# Patient Record
Sex: Female | Born: 1935 | Race: Black or African American | Hispanic: No | Marital: Married | Smoking: Former smoker
Health system: Southern US, Community
[De-identification: ages and names within clinical notes are randomized; demographics above are authoritative.]

## PROBLEM LIST (undated history)

## (undated) DIAGNOSIS — Z0389 Encounter for observation for other suspected diseases and conditions ruled out: Secondary | ICD-10-CM

## (undated) DIAGNOSIS — R222 Localized swelling, mass and lump, trunk: Secondary | ICD-10-CM

## (undated) DIAGNOSIS — E119 Type 2 diabetes mellitus without complications: Secondary | ICD-10-CM

## (undated) DIAGNOSIS — I5032 Chronic diastolic (congestive) heart failure: Secondary | ICD-10-CM

## (undated) DIAGNOSIS — I4892 Unspecified atrial flutter: Secondary | ICD-10-CM

## (undated) DIAGNOSIS — I480 Paroxysmal atrial fibrillation: Secondary | ICD-10-CM

## (undated) DIAGNOSIS — I272 Pulmonary hypertension, unspecified: Secondary | ICD-10-CM

## (undated) DIAGNOSIS — E7800 Pure hypercholesterolemia, unspecified: Secondary | ICD-10-CM

## (undated) DIAGNOSIS — I10 Essential (primary) hypertension: Secondary | ICD-10-CM

## (undated) DIAGNOSIS — Z96659 Presence of unspecified artificial knee joint: Secondary | ICD-10-CM

## (undated) DIAGNOSIS — I2510 Atherosclerotic heart disease of native coronary artery without angina pectoris: Secondary | ICD-10-CM

## (undated) DIAGNOSIS — M1990 Unspecified osteoarthritis, unspecified site: Secondary | ICD-10-CM

## (undated) DIAGNOSIS — Z950 Presence of cardiac pacemaker: Secondary | ICD-10-CM

## (undated) DIAGNOSIS — I495 Sick sinus syndrome: Secondary | ICD-10-CM

## (undated) DIAGNOSIS — K760 Fatty (change of) liver, not elsewhere classified: Secondary | ICD-10-CM

## (undated) HISTORY — DX: Fatty (change of) liver, not elsewhere classified: K76.0

## (undated) HISTORY — DX: Pure hypercholesterolemia, unspecified: E78.00

## (undated) HISTORY — DX: Chronic diastolic (congestive) heart failure: I50.32

## (undated) HISTORY — PX: BREAST SURGERY: SHX581

## (undated) HISTORY — DX: Localized swelling, mass and lump, trunk: R22.2

## (undated) HISTORY — PX: TONSILLECTOMY AND ADENOIDECTOMY: SUR1326

## (undated) HISTORY — PX: CATARACT EXTRACTION: SUR2

## (undated) HISTORY — DX: Atherosclerotic heart disease of native coronary artery without angina pectoris: I25.10

## (undated) HISTORY — DX: Sick sinus syndrome: I49.5

## (undated) HISTORY — DX: Essential (primary) hypertension: I10

## (undated) HISTORY — DX: Type 2 diabetes mellitus without complications: E11.9

## (undated) HISTORY — DX: Paroxysmal atrial fibrillation: I48.0

## (undated) HISTORY — DX: Presence of cardiac pacemaker: Z95.0

## (undated) HISTORY — DX: Pulmonary hypertension, unspecified: I27.20

## (undated) HISTORY — DX: Unspecified osteoarthritis, unspecified site: M19.90

## (undated) HISTORY — PX: TOTAL KNEE ARTHROPLASTY: SHX125

## (undated) HISTORY — DX: Presence of unspecified artificial knee joint: Z96.659

## (undated) HISTORY — DX: Unspecified atrial flutter: I48.92

---

## 1998-08-04 ENCOUNTER — Other Ambulatory Visit: Admission: RE | Admit: 1998-08-04 | Discharge: 1998-08-04 | Payer: Self-pay | Admitting: *Deleted

## 1998-08-27 ENCOUNTER — Ambulatory Visit (HOSPITAL_COMMUNITY): Admission: RE | Admit: 1998-08-27 | Discharge: 1998-08-27 | Payer: Self-pay | Admitting: *Deleted

## 1999-01-27 ENCOUNTER — Ambulatory Visit (HOSPITAL_COMMUNITY): Admission: RE | Admit: 1999-01-27 | Discharge: 1999-01-27 | Payer: Self-pay | Admitting: Internal Medicine

## 1999-01-27 ENCOUNTER — Encounter: Payer: Self-pay | Admitting: Internal Medicine

## 1999-08-14 ENCOUNTER — Other Ambulatory Visit: Admission: RE | Admit: 1999-08-14 | Discharge: 1999-08-14 | Payer: Self-pay | Admitting: *Deleted

## 1999-09-03 ENCOUNTER — Ambulatory Visit (HOSPITAL_COMMUNITY): Admission: RE | Admit: 1999-09-03 | Discharge: 1999-09-03 | Payer: Self-pay | Admitting: *Deleted

## 2000-10-04 ENCOUNTER — Encounter: Payer: Self-pay | Admitting: Internal Medicine

## 2000-10-04 ENCOUNTER — Ambulatory Visit (HOSPITAL_COMMUNITY): Admission: RE | Admit: 2000-10-04 | Discharge: 2000-10-04 | Payer: Self-pay | Admitting: Internal Medicine

## 2000-11-14 ENCOUNTER — Ambulatory Visit (HOSPITAL_COMMUNITY): Admission: RE | Admit: 2000-11-14 | Discharge: 2000-11-14 | Payer: Self-pay | Admitting: Gastroenterology

## 2001-09-22 ENCOUNTER — Other Ambulatory Visit: Admission: RE | Admit: 2001-09-22 | Discharge: 2001-09-22 | Payer: Self-pay | Admitting: Internal Medicine

## 2001-10-09 ENCOUNTER — Encounter: Payer: Self-pay | Admitting: Internal Medicine

## 2001-10-09 ENCOUNTER — Ambulatory Visit (HOSPITAL_COMMUNITY): Admission: RE | Admit: 2001-10-09 | Discharge: 2001-10-09 | Payer: Self-pay | Admitting: Internal Medicine

## 2002-10-17 ENCOUNTER — Ambulatory Visit (HOSPITAL_COMMUNITY): Admission: RE | Admit: 2002-10-17 | Discharge: 2002-10-17 | Payer: Self-pay | Admitting: Internal Medicine

## 2002-10-17 ENCOUNTER — Encounter: Payer: Self-pay | Admitting: Internal Medicine

## 2003-10-29 ENCOUNTER — Encounter: Admission: RE | Admit: 2003-10-29 | Discharge: 2003-10-29 | Payer: Self-pay | Admitting: Internal Medicine

## 2003-11-21 ENCOUNTER — Other Ambulatory Visit: Admission: RE | Admit: 2003-11-21 | Discharge: 2003-11-21 | Payer: Self-pay | Admitting: Internal Medicine

## 2004-11-03 ENCOUNTER — Encounter: Admission: RE | Admit: 2004-11-03 | Discharge: 2004-11-03 | Payer: Self-pay | Admitting: Internal Medicine

## 2005-08-03 ENCOUNTER — Encounter: Admission: RE | Admit: 2005-08-03 | Discharge: 2005-08-03 | Payer: Self-pay | Admitting: Orthopedic Surgery

## 2005-11-08 ENCOUNTER — Encounter: Admission: RE | Admit: 2005-11-08 | Discharge: 2005-11-08 | Payer: Self-pay | Admitting: Internal Medicine

## 2005-11-29 ENCOUNTER — Inpatient Hospital Stay (HOSPITAL_COMMUNITY): Admission: RE | Admit: 2005-11-29 | Discharge: 2005-12-02 | Payer: Self-pay | Admitting: Orthopedic Surgery

## 2005-12-05 ENCOUNTER — Emergency Department (HOSPITAL_COMMUNITY): Admission: EM | Admit: 2005-12-05 | Discharge: 2005-12-05 | Payer: Self-pay | Admitting: Emergency Medicine

## 2006-02-23 ENCOUNTER — Ambulatory Visit (HOSPITAL_BASED_OUTPATIENT_CLINIC_OR_DEPARTMENT_OTHER): Admission: RE | Admit: 2006-02-23 | Discharge: 2006-02-23 | Payer: Self-pay | Admitting: Orthopedic Surgery

## 2006-11-11 ENCOUNTER — Encounter: Admission: RE | Admit: 2006-11-11 | Discharge: 2006-11-11 | Payer: Self-pay | Admitting: Internal Medicine

## 2007-11-13 ENCOUNTER — Encounter: Admission: RE | Admit: 2007-11-13 | Discharge: 2007-11-13 | Payer: Self-pay | Admitting: Internal Medicine

## 2008-01-18 ENCOUNTER — Encounter: Admission: RE | Admit: 2008-01-18 | Discharge: 2008-01-18 | Payer: Self-pay | Admitting: Internal Medicine

## 2008-06-27 ENCOUNTER — Other Ambulatory Visit: Admission: RE | Admit: 2008-06-27 | Discharge: 2008-06-27 | Payer: Self-pay | Admitting: Internal Medicine

## 2008-07-08 ENCOUNTER — Encounter: Admission: RE | Admit: 2008-07-08 | Discharge: 2008-07-08 | Payer: Self-pay | Admitting: Internal Medicine

## 2009-02-03 ENCOUNTER — Encounter: Admission: RE | Admit: 2009-02-03 | Discharge: 2009-02-03 | Payer: Self-pay | Admitting: Internal Medicine

## 2009-10-24 ENCOUNTER — Ambulatory Visit (HOSPITAL_COMMUNITY): Admission: RE | Admit: 2009-10-24 | Discharge: 2009-10-24 | Payer: Self-pay | Admitting: Internal Medicine

## 2009-12-07 ENCOUNTER — Emergency Department (HOSPITAL_COMMUNITY): Admission: EM | Admit: 2009-12-07 | Discharge: 2009-12-07 | Payer: Self-pay | Admitting: Emergency Medicine

## 2010-02-10 ENCOUNTER — Encounter: Admission: RE | Admit: 2010-02-10 | Discharge: 2010-02-10 | Payer: Self-pay | Admitting: Internal Medicine

## 2010-09-03 ENCOUNTER — Encounter
Admission: RE | Admit: 2010-09-03 | Discharge: 2010-09-03 | Payer: Self-pay | Source: Home / Self Care | Attending: Internal Medicine | Admitting: Internal Medicine

## 2010-11-18 LAB — DIFFERENTIAL
Basophils Absolute: 0.1 10*3/uL (ref 0.0–0.1)
Basophils Relative: 1 % (ref 0–1)
Eosinophils Absolute: 0.1 10*3/uL (ref 0.0–0.7)
Eosinophils Relative: 1 % (ref 0–5)
Monocytes Absolute: 0.3 10*3/uL (ref 0.1–1.0)
Monocytes Relative: 5 % (ref 3–12)

## 2010-11-18 LAB — POCT CARDIAC MARKERS
CKMB, poc: 1.3 ng/mL (ref 1.0–8.0)
Myoglobin, poc: 102 ng/mL (ref 12–200)
Myoglobin, poc: 62.9 ng/mL (ref 12–200)
Troponin i, poc: <0.05 ng/mL (ref 0.00–0.09)

## 2010-11-18 LAB — D-DIMER, QUANTITATIVE: D-Dimer, Quant: 0.76 ug/mL-FEU — ABNORMAL HIGH (ref 0.00–0.48)

## 2010-11-18 LAB — POCT I-STAT, CHEM 8
BUN: 20 mg/dL (ref 6–23)
Calcium, Ion: 1.08 mmol/L — ABNORMAL LOW (ref 1.12–1.32)
Creatinine, Ser: 0.8 mg/dL (ref 0.4–1.2)
Glucose, Bld: 169 mg/dL — ABNORMAL HIGH (ref 70–99)
Potassium: 3.8 mEq/L (ref 3.5–5.1)
TCO2: 25 mmol/L (ref 0–100)

## 2010-11-18 LAB — URINALYSIS, ROUTINE W REFLEX MICROSCOPIC
Bilirubin Urine: NEGATIVE
Ketones, ur: NEGATIVE mg/dL
Nitrite: NEGATIVE
Protein, ur: 30 mg/dL — AB
pH: 6.5 (ref 5.0–8.0)

## 2010-11-18 LAB — CBC
MCV: 90.3 fL (ref 78.0–100.0)
RBC: 4.23 MIL/uL (ref 3.87–5.11)

## 2010-11-18 LAB — PROTIME-INR: Prothrombin Time: 13.1 seconds (ref 11.6–15.2)

## 2010-11-18 LAB — URINE MICROSCOPIC-ADD ON

## 2010-11-18 LAB — HEPATIC FUNCTION PANEL: Total Protein: 7 g/dL (ref 6.0–8.3)

## 2010-11-18 LAB — URINE CULTURE: Colony Count: 25000

## 2011-01-15 NOTE — Op Note (Signed)
NAME:  Tracey Todd, Tracey Todd               ACCOUNT NO.:  [id number]   MEDICAL RECORD NO.:  [id number]          PATIENT TYPE:  INP   LOCATION:  5001                         FACILITY:  [facility]   PHYSICIAN:  Mila Homer. Sherlean Foot, M.D. 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   OPERATION PERFORMED:  Left revision total knee arthroplasty.   SURGEON:  Mila Homer. Sherlean Foot, M.D.   ASSISTANTArlys John D. Petrarca, P.A.-C.   ANESTHESIA:  General.   INDICATIONS FOR PROCEDURE:  The patient is a 76 year old black female with  failure of a total knee arthroplasty at 12 years.  Informed consent was  obtained.  Medical clearance was obtained.   DESCRIPTION OF PROCEDURE:  The patient was laid supine and administered  general anesthesia.  The left lower extremity was prepped and draped in the  usual sterile fashion.  Extremity was exsanguinated with the Esmarch and  tourniquet elevated to 375 mmHg.  Old incision was used and was made with a  #10 blade.  I performed a medium parapatellar arthrotomy as well as  quadriceps snip for exposure.  I removed an excessive amount of scar tissue.  There was lots of heterotopic calcification in the suprapatellar pouch  medially. That was all removed as well.  I then used a small sagittal saw  and loosened up both components easily, finished that with some Rosen  osteotomes and easily removed both components.  I curetted and rongeured out  all debris from the undersurface of the prosthesis bone interface and then  turned attention to leg balancing.  I reamed both by hand and under power on  the tibia up out to 15 and on the femur up to 17.  I then sagittally sized  the femur to a size D and put a 5  distal, 5 posterior medial augment on the  cutting block and tamped it down with an offset femoral stem and cut the  box.  I then turned attention to the tibia where I put the 14 mm reamer  down, used the intramedullary cutting block and freshened up the tibial cut.  I then prepared the tibia with a drill and keel with a size 3 tibial tray.  I then trialed with a size 3 tibial tray with a 15 stem, a size D femur with  an offset stem maximally rotated towards the medial side to give better  coverage as well as a 5 mm distal augment and 5 mm posterior medial augment.  This afforded excellent flexion and extension gap balance with a 14 mm  polyethylene in place.  I then removed the old patella which was loose,  drilled three lug holes with a 32 template in place, had good patellar  tracking as well.  I then removed all the components, copiously irrigated  and then cemented in the tibia  first, femur second, removed excess cement,  snapped in the real polyethylene and left the knee in extension while I  cemented in the patella.  I then closed the arthrotomy with interrupted #2  Ethibond sutures.  Repaired the quadriceps snip with simple and interrupted  figure-of-eight sutures Ethibond sutures.  Closed the deep soft tissue with  interrupted 0 Vicryls, running subcuticular Vicryl and skin staples. Left  the Hemovac coming out deep to the arthrotomy.  I did infiltrate some  Marcaine into the capsule and dressed with Xeroform dressing sponges,  sterile Webril and TED stockings.   COMPLICATIONS:  None.   DRAINS:  One Hemovac.   ESTIMATED BLOOD LOSS:  300 mL.   TOURNIQUET TIME:  Two hours.           ______________________________  Mila Homer Sherlean Foot, M.D.     SDL/MEDQ  D:  11/29/2005  T:  11/30/2005  Job:  [id number]

## 2011-01-15 NOTE — Discharge Summary (Signed)
NAME:  Tracey Todd, Tracey Todd               ACCOUNT NO.:  [id number]   MEDICAL RECORD NO.:  [id number]          PATIENT TYPE:  INP   LOCATION:  5001                         FACILITY:  [facility]   PHYSICIAN:  Mila Homer. Sherlean Foot, M.D. DATE OF BIRTH:  September 25, 1934   DATE OF ADMISSION:  11/29/2005  DATE OF DISCHARGE:  12/02/2005                                 DISCHARGE SUMMARY   ADMISSION DIAGNOSES:  1.  Left total knee arthroplasty in 1997, now with probably aseptic      loosening.  2.  Right total knee arthroplasty on May 01, 1997, doing well.  3.  Hypertension.  4.  Diabetes mellitus since 1997.  5.  Seasonal allergies.  6.  Hyperlipidemia.   DISCHARGE DIAGNOSES:  1.  Status post left total knee revision.  2.  History of right total knee arthroplasty.  3.  Acute blood loss anemia secondary to surgery requiring blood      transfusion.  4.  Pyrexia, resolved.  5.  Hypokalemia, treated.  6.  Hypertension.  7.  Diabetes mellitus since 1997.  8.  Seasonal allergies.  9.  Hyperlipidemia.   ALLERGIES:  PENICILLIN causes breakout in hives and welts.   HOME MEDICATIONS:  1.  Quinapril 20 mg daily a.m.  2.  Verapamil 180 mg one q.a.m.  3.  Potassium chloride 10 mEq one tab q.a.m.  4.  Hydrochlorothiazide 25 mg daily.  5.  Avandaryl 4 mg/1 mg one daily q.a.m.  6.  Lipitor 10 mg q.h.s.  7.  Tylenol p.r.n.  8.  Vicodin 5/500 mg p.r.n. pain.   SURGICAL PROCEDURE:  The patient was taken to the operating room on November 29, 2005, by Dr. Georgena Spurling, assisted by Jacqualine Code, P.A.-C.  The patient  was placed under general anesthesia and then underwent a revision of all  components of the left total knee.  The following components were inserted:  All poly-patella size 32, 8.5 mm thickness, a size 3 tibial component with  fluted stem, femoral component size D, with offset stem and 5 mm distal  augmentation and 5 mm posterior augment, and a 14 mm polyethylene insert.  The patient tolerated the  procedure well and returned to recovery in good  stable condition.   CONSULTATIONS:  The following consults were obtained while the patient was  hospitalized:  1.  PT.  2.  OT.  3.  Case management.  4.  Diabetes consult.   HOSPITAL COURSE:  On postoperative day #1, with some nausea overnight  resolving as of the morning rounds.  The patient's H&H was 8.9 and 26.4.  The patient's temperature max was 101 degrees Fahrenheit.  Vital signs were  stable.  Respiratory rate was slightly elevated at 20.  The patient was  transfused 2 units of packed red blood cells.  On postoperative day #2, the  patient denied any chest pain, shortness of breath, nausea or vomiting.  Pain under control, tolerating diet.  H&H had improved to 10.9 and 31,  status post 2 units packed red blood cells.  The patient with pyrexia and  slight increase in  white blood count, thought to be secondary to the packed  red blood cells.  Aggressive pulmonary toileting was instituted.  Otherwise,  the patient's vital signs were stable.  On postoperative day #3, the patient  overall feeling much improved.  No chest pain, shortness of breath, nausea  or vomiting.  Pain under adequate control.  Moderate assist for transfers.  Min assist with ambulation.  Temperature max 99.1.  Vital signs otherwise  stable.  The patient was slightly hyponatremic and hypokalemic.  Potassium  was replaced.  The patient's H&H was stable at 10 and 29.9.  White count was  9600.  The patient was to work with therapy and remained stable, and was to  be discharged later that afternoon.   LABORATORY DATA:  Routine labs on admission:  CBC:  White count was 5100,  hemoglobin was low at 11.6, hematocrit was 34.6, platelets 264.  Coags on  admission:  All values within normal limits.  Routine chemistries on  admission:  All values within normal limits.  Urinalysis on admission was  negative.  EKG dated November 24, 2005, showed sinus bradycardia with short  PR  interval,  T-wave inversion, unable to exclude ischemia.  Heart rate was 51  beats per minute, PR interval 106 milliseconds, PRT axis 6, -19, -21.   DISCHARGE MEDICATIONS:  The patient was to resume home medications, except  for Tylenol and Vicodin.  Had the following medications:  1.  Lovenox 40 mg one injection daily at 8 a.m.  2.  Percocet 5/325 mg one to two tablets q.4-6h. for pain.  3.  Robaxin 500 mg one tab q.6-8h. for spasm.   DIET:  Carb modified diet.   ACTIVITY:  The patient is weightbearing as tolerated with walker.   WOUND CARE:  The patient is to keep wound clean and dry, change dressing  daily.  Call if temperature greater than 101.5, foul-smelling drainage, or  pain not under control.  The patient may shower after two days of no  drainage.   FOLLOWUP:  The patient needs followup with Dr. Sherlean Foot in 14 days  postoperative.  The patient is to call the office at [phone] for  appointment.  1.  The patient is to follow up with Dr. Marisue Brooklyn to check potassium      within seven to 10 days.  2.  Home health PT per [facility].   SPECIAL INSTRUCTIONS:  CPM 0 to 90 degrees, six to eight hours a day.   CONDITION ON DISCHARGE:  The patient was discharged to home in good and  stable condition.      Richardean Canal, P.A.    ______________________________  Mila Homer. Sherlean Foot, M.D.    GC/MEDQ  D:  02/04/2006  T:  02/05/2006  Job:  [id number]   cc:   Lovenia Kim, D.O.  Fax: [phone]

## 2011-01-15 NOTE — Procedures (Signed)
[facility]  Patient:    Tracey Todd, Tracey Todd                      MRN: [id number] Proc. Date: 11/14/00 Adm. Date:  [id number] Attending:  Orland Mustard CC:         Marinus Maw, M.D.   Procedure Report  PROCEDURE:  Colonoscopy.  MEDICATIONS:  Fentanyl 62.5 mcg, Versed 8 mg IV.  SCOPE:  Adult Olympus video colonoscope.  INDICATIONS FOR PROCEDURE:  Strong family history of colon cancer.  DESCRIPTION OF PROCEDURE:  The procedure had been explained to the patient and consent obtained. With the patient in the left lateral decubitus position, the Olympus video colonoscope was inserted and advanced under direct visualization. The prep was excellent. We were able to advance to the cecum without a great deal of difficulty. The ileocecal valve and appendiceal orifice were seen. The scope withdrawn, cecum, ascending colon, hepatic flexure, transverse colon, splenic flexure, descending and sigmoid colon were seen well. No polyps or other lesions were seen.  ASSESSMENT:  Essentially normal colonoscopy.  PLAN:  Will recommend repeating in five years due to her family history with routine yearly Hemoccult exams. DD:  11/14/00 TD:  11/15/00 Job: [id number]

## 2011-01-15 NOTE — Op Note (Signed)
NAME:  Tracey Todd, Tracey Todd               ACCOUNT NO.:  [id number]   MEDICAL RECORD NO.:  [id number]          PATIENT TYPE:  AMB   LOCATION:  DSC                          FACILITY:  [facility]   PHYSICIAN:  Mila Homer. Sherlean Foot, M.D. DATE OF BIRTH:  1935-05-05   DATE OF PROCEDURE:  02/23/2006  DATE OF DISCHARGE:                                 OPERATIVE REPORT   PREOPERATIVE DIAGNOSIS:  Left knee arthrofibrosis.   POSTOPERATIVE DIAGNOSIS:  Left knee arthrofibrosis.   PROCEDURE:  Left knee manipulation.   SURGEON:  Mila Homer. Sherlean Foot, M.D.   ASSISTANT:  None.   ANESTHESIA:  General mask anesthesia.   INDICATIONS FOR PROCEDURE:  Patient is a 76 year old black female with  failure to conservative measures postoperatively after revision total knee  arthroplasty just over two months ago.  Informed consent obtained.   DESCRIPTION OF PROCEDURE:  Patient was placed supine and administered mask  anesthesia.  Left knee was then manipulated.  I took pictures to verify pre-  and post manipulation.  We started with 80 degrees, got to 95 degrees only.  Then infiltrated with a 10 mL Depo-Medrol/Marcaine mixture and woke the  patient up and took her to the recovery room in stable condition.           ______________________________  Mila Homer Sherlean Foot, M.D.     SDL/MEDQ  D:  02/23/2006  T:  02/23/2006  Job:  [id number]

## 2011-01-15 NOTE — H&P (Signed)
NAME:  Tracey Todd, Tracey Todd               ACCOUNT NO.:  [id number]   MEDICAL RECORD NO.:  [id number]            PATIENT TYPE:   LOCATION:                                 FACILITY:   PHYSICIAN:  Mila Homer. Sherlean Foot, M.D. DATE OF BIRTH:  February 06, 1935   DATE OF ADMISSION:  11/29/2005  DATE OF DISCHARGE:                                HISTORY & PHYSICAL   CHIEF COMPLAINT:  Left total knee with aseptic loosening.   HISTORY OF PRESENT ILLNESS:  The patient is a pleasant 76 year old African-  American female with painful left total knee.  Left total knee angioplasty  was performed by Dr. Madelon Lips in 1997.  The patient reports that  postoperatively she had some pain and stiffness in the left knee.  Eventually that same year underwent a left knee arthroscopy to remove some  scar tissue.  The patient did well after the knee arthroscopy until she  developed pain November 2006.  The patient now with intermittent pain in the  left knee, made worse with ambulation.  The left knee pain does radiate into  the left mid femur. Denies any waking pain or mechanical symptoms.  She did  undergo bone scan which showed uptake around the left total knee prosthesis.  Compared with plain film examination on November 30, there is minimal  lucency around the tibial component.  General degree of component loosening  could not be excluded.  Given the symmetry on the angiographic and immediate  static phases of the study, prosthetic infection was considered to be less  likely.   ALLERGIES:  The patient is allergic to PENICILLIN which causes her to break  out in welts and hives.   CURRENT MEDICATIONS:  1.  Quinapril 20 mg 1 daily in the a.m.  2.  Verapamil 180 mg daily in the a.m.  3.  Potassium chloride 10 mg 1 daily in the a.m.  4.  Hydrochlorothiazide 25 mg a day.  5.  Avandaryl 4 mg/4 mg 1 daily q.a.m.  6.  Lipitor 10 mg nightly.  7.  Tylenol p.r.n.  8.  Vicodin 5/500 p.r.n. pain.   PAST MEDICAL HISTORY:  1.   Hypertension.  2.  Diabetes mellitus, type 2, diagnosed in 1997.  3.  Seasonal allergies.  4.  Hyperlipidemia.  5.  History of bilateral knee replacements.   PAST SURGICAL HISTORY:  1.  Left total knee angioplasty November 23, 1995, by Dr. Madelon Lips.  2.  Left knee arthroscopy in 1997 by Dr. Madelon Lips.  3.  Right total knee angioplasty May 01, 1997, Dr. Madelon Lips.   The patient denies any complications with the above procedures. She did  require blood transfusion with one of the knee replacements.   SOCIAL HISTORY:  The patient quit smoking in 1964.  She denies any alcohol  use.  She lives with her husband in a one-story home with one step to usual  entrance.  The patient's primary care physician is Lovenia Kim, D.O.;  cardiologist is Nanetta Batty, M.D.   FAMILY HISTORY:  The patient's mother deceased at age 29 and had a history  of hypertension, diabetes, and liver cancer.  Father deceased at age 86 with  colon cancer.  She has one living brother who has a history of a brain  tumor.  She has one living sister who is age 74, and she is unaware of any  health problems.   REVIEW OF SYSTEMS:  The patient denies any recent cold, flu, fever.  She  denies any chest pain, PND, or orthopnea.  She wears glasses at all times.  She has dentures both top and bottom.  History of bronchitis 3 years ago.  Has hypertension reported to be under good control.  She had rheumatic fever  as a child, has diabetes mellitus which is controlled on oral medications.  History of anemia.  She has frequent urination and nocturia which is  unchanged.  Otherwise, Review of Systems is negative or noncontributory.   PHYSICAL EXAMINATION:  GENERAL:  The patient is a well-developed, well-  nourished, overweight female who walks with slight antalgic gait and limp on  the left.  The patient uses no assistive devices to ambulate.  The patient  is oriented and talks easily with examiner.  VITAL SIGNS: Height 4 feet  11 inches, weight 200 pounds.  Temperature 97.8,  pulse 70, blood pressure 118/70, respiratory rate 16.  HEENT:  Head is normocephalic and atraumatic without frontal or maxillary  sinus tenderness to palpation. Conjunctivae pink.  Sclerae nonicteric.  PERRLA.  EOMs intact.  No external ear deformities noted.  TMs pearly gray  bilaterally.  Nose: Nasal septum midline, nasal mucosa pink and moist  without polyps. Buccal mucosa pink and moist.  The patient has teeth top and  jaw line removed and no evidence of poor fitting dentures noted. Pharynx  without erythema or exudate.  Tongue and uvula midline.  CARDIAC:  Regular rate and rhythm.  No murmurs, rubs, or gallops noted.  LUNGS: Clear to auscultation bilaterally without wheeze, rhonchi, or rales.  ABDOMEN:  Obese, soft.  No tenderness with palpation.  Bowel sounds x4  quadrants.  NECK: Trachea is midline.  No lymphadenopathy.  Carotids 2+ bilaterally  without bruits. She has full range of motion of the cervical spine without  pain and no tenderness to palpation of the cervical spine.  NEUROLOGIC: The patient alert and oriented x3.  Cranial nerves II-XII  grossly intact.  Lower extremity strength testing reveals 5/5 strength  throughout the lower extremities.  Deep tendon reflexes are 2+ at the ankles  and patellae bilaterally.  MUSCULOSKELETAL:  Upper extremities are equal and symmetric in size and  shape. The patient has full range of motion of shoulders, elbows, wrists,  and hands.  Radial pulses are 2+ bilaterally, equal, and symmetric.  She has  good sensation to light touch in fingertips. In the lower extremities, the  patient has full range of motion of both hips; however, external rotation of  the left hip causes some pain.  She does have some tenderness over the left  greater trochanteric region.  On the right knee, she has 0 to 100 degrees flexion, well-healed scars, no effusion.  Valgus varus stress reveals no  laxity.  Calf  nontender.  Left knee 0 to 103 degrees flexion. Well-healed  scar.  She has tenderness upon medial and lateral joint lines.  Valgus varus  stress reveals no excessive laxity.  There is no erythema, no edema, no  color about the knee. Calf is nontender to palpation.  Dorsal and pedal  pulses are 2+ bilaterally. The patient  has good sensation to light touch in  the toes bilaterally.   IMPRESSION:  1.  Left total knee angioplasty in 1997, now with probable aseptic      loosening.  2.  Right total knee angioplasty May 01, 1997, Dr. Madelon Lips, doing well.  3.  Hypertension.  4.  Diabetes mellitus since 1997.  5.  Seasonal allergies.  6.  Hyperlipidemia.   PLAN:  The patient is to undergo __________ all preoperative lab testing.  The patient did receive cardiac clearance from Dr. Allyson Sabal and was evaluated  by her primary care physician, Dr. Marisue Brooklyn.      Richardean Canal, P.A.    ______________________________  Mila Homer. Sherlean Foot, M.D.    GC/MEDQ  D:  11/18/2005  T:  11/18/2005  Job:  [id number]

## 2011-01-21 ENCOUNTER — Ambulatory Visit (HOSPITAL_COMMUNITY)
Admission: RE | Admit: 2011-01-21 | Discharge: 2011-01-21 | Disposition: A | Discharge status: Home / Self Care | Payer: Medicare Other | Source: Ambulatory Visit | Attending: Internal Medicine | Admitting: Internal Medicine

## 2011-01-21 ENCOUNTER — Other Ambulatory Visit (HOSPITAL_COMMUNITY): Payer: Self-pay | Admitting: Internal Medicine

## 2011-01-21 DIAGNOSIS — R918 Other nonspecific abnormal finding of lung field: Secondary | ICD-10-CM

## 2011-01-21 DIAGNOSIS — E119 Type 2 diabetes mellitus without complications: Secondary | ICD-10-CM | POA: Insufficient documentation

## 2011-01-21 DIAGNOSIS — I10 Essential (primary) hypertension: Secondary | ICD-10-CM | POA: Insufficient documentation

## 2011-01-21 DIAGNOSIS — R911 Solitary pulmonary nodule: Secondary | ICD-10-CM | POA: Insufficient documentation

## 2011-02-18 ENCOUNTER — Other Ambulatory Visit: Payer: Self-pay | Admitting: Internal Medicine

## 2011-02-18 DIAGNOSIS — Z1231 Encounter for screening mammogram for malignant neoplasm of breast: Secondary | ICD-10-CM

## 2011-03-01 ENCOUNTER — Encounter: Payer: Self-pay | Admitting: Podiatrist

## 2011-03-10 ENCOUNTER — Ambulatory Visit
Admission: RE | Admit: 2011-03-10 | Discharge: 2011-03-10 | Disposition: A | Discharge status: Home / Self Care | Payer: Medicare Other | Source: Ambulatory Visit | Attending: Internal Medicine | Admitting: Internal Medicine

## 2011-03-10 DIAGNOSIS — Z1231 Encounter for screening mammogram for malignant neoplasm of breast: Secondary | ICD-10-CM

## 2011-07-16 ENCOUNTER — Other Ambulatory Visit (HOSPITAL_COMMUNITY): Payer: Self-pay | Admitting: Internal Medicine

## 2011-07-16 ENCOUNTER — Ambulatory Visit (HOSPITAL_COMMUNITY)
Admission: RE | Admit: 2011-07-16 | Discharge: 2011-07-16 | Disposition: A | Discharge status: Home / Self Care | Payer: Medicare Other | Source: Ambulatory Visit | Attending: Internal Medicine | Admitting: Internal Medicine

## 2011-07-16 DIAGNOSIS — M25559 Pain in unspecified hip: Secondary | ICD-10-CM

## 2011-07-16 DIAGNOSIS — R109 Unspecified abdominal pain: Secondary | ICD-10-CM | POA: Insufficient documentation

## 2011-12-01 ENCOUNTER — Ambulatory Visit (HOSPITAL_COMMUNITY)
Admission: RE | Admit: 2011-12-01 | Discharge: 2011-12-01 | Disposition: A | Discharge status: Home / Self Care | Payer: Medicare Other | Source: Ambulatory Visit | Attending: Internal Medicine | Admitting: Internal Medicine

## 2011-12-01 ENCOUNTER — Other Ambulatory Visit (HOSPITAL_COMMUNITY): Payer: Self-pay | Admitting: Internal Medicine

## 2011-12-01 DIAGNOSIS — R059 Cough, unspecified: Secondary | ICD-10-CM | POA: Insufficient documentation

## 2011-12-01 DIAGNOSIS — I517 Cardiomegaly: Secondary | ICD-10-CM | POA: Insufficient documentation

## 2011-12-01 DIAGNOSIS — J40 Bronchitis, not specified as acute or chronic: Secondary | ICD-10-CM

## 2011-12-01 DIAGNOSIS — R05 Cough: Secondary | ICD-10-CM | POA: Insufficient documentation

## 2011-12-01 DIAGNOSIS — R0989 Other specified symptoms and signs involving the circulatory and respiratory systems: Secondary | ICD-10-CM | POA: Insufficient documentation

## 2011-12-03 ENCOUNTER — Encounter: Payer: Self-pay | Admitting: Cardiovascular Disease

## 2011-12-13 ENCOUNTER — Encounter: Payer: Self-pay | Admitting: *Deleted

## 2011-12-14 ENCOUNTER — Encounter: Payer: Self-pay | Admitting: *Deleted

## 2011-12-14 ENCOUNTER — Encounter: Payer: Self-pay | Admitting: Cardiovascular Disease

## 2011-12-14 ENCOUNTER — Ambulatory Visit (INDEPENDENT_AMBULATORY_CARE_PROVIDER_SITE_OTHER): Payer: Medicare Other | Admitting: Cardiovascular Disease

## 2011-12-14 VITALS — BP 117/72 | HR 66 | Ht <= 58 in | Wt 196.0 lb

## 2011-12-14 DIAGNOSIS — R0989 Other specified symptoms and signs involving the circulatory and respiratory systems: Secondary | ICD-10-CM

## 2011-12-14 DIAGNOSIS — R002 Palpitations: Secondary | ICD-10-CM

## 2011-12-14 DIAGNOSIS — E119 Type 2 diabetes mellitus without complications: Secondary | ICD-10-CM

## 2011-12-14 DIAGNOSIS — I517 Cardiomegaly: Secondary | ICD-10-CM

## 2011-12-14 DIAGNOSIS — R0600 Dyspnea, unspecified: Secondary | ICD-10-CM

## 2011-12-14 NOTE — Assessment & Plan Note (Signed)
Discussed low carb diet.  Target hemoglobin A1c is 6.5 or less.  Continue current medications.  

## 2011-12-14 NOTE — Assessment & Plan Note (Signed)
Check echo.  R/O DCM

## 2011-12-14 NOTE — Patient Instructions (Signed)
Your physician recommends that you schedule a follow-up appointment in: AS NEEDED  Your physician recommends that you continue on your current medications as directed. Please refer to the Current Medication list given to you today.   Your physician has requested that you have an echocardiogram. Echocardiography is a painless test that uses sound waves to create images of your heart. It provides your doctor with information about the size and shape of your heart and how well your heart's chambers and valves are working. This procedure takes approximately one hour. There are no restrictions for this procedure. DX DYSPNEA  Your physician has requested that you have a lexiscan myoview. For further information please visit [email or web address]. Please follow instruction sheet, as given. DX DYSPNEA  

## 2011-12-14 NOTE — Assessment & Plan Note (Signed)
Benign would not do monitor unless echo or myovue abnormal

## 2011-12-14 NOTE — Progress Notes (Signed)
Patient ID: Tracey Todd, female   DOB: Dec 06, 1934, 77 y.o.   MRN: [id number] 77 yo referred by Loree Fee PA-C for dyspnea, palpitaitons and abnormal ECG.  Recent URI for a month.  Some chest tightness with coughing.  CXR with no pneumonia but ? Cardiomegaly.  Last myovue 1996 before knee surgery. No cath.  Dyspnea is chronic but worse last month. No fever or sputum just URI symptoms.  Pressure in chest associated with URI and cough.  No history of CHF or LVE.  CXR with ? Cardiomegaly but no CHF.  Compliant with meds.  No syncope.  Palpitatoins occur at rest and not related to exertion.  Still walks on a daily basis but more fatigued.  Compliant with meds Labs reviewed and normal with TSH of 2  ROS: Denies fever, malais, weight loss, blurry vision, decreased visual acuity, cough, sputum, SOB, hemoptysis, pleuritic pain, palpitaitons, heartburn, abdominal pain, melena, lower extremity edema, claudication, or rash.  All other systems reviewed and negative   General: Affect appropriate Healthy:  appears stated age HEENT: normal Neck supple with no adenopathy JVP normal no bruits no thyromegaly Lungs clear with no wheezing and good diaphragmatic motion Heart:  S1/S2 no murmur,rub, gallop or click PMI normal Abdomen: benighn, BS positve, no tenderness, no AAA no bruit.  No HSM or HJR Distal pulses intact with no bruits No edema Neuro non-focal Skin warm and dry No muscular weakness  Medications Current Outpatient Prescriptions  Medication Sig Dispense Refill  . Acetaminophen (ARTHRITIS PAIN RELIEVER PO) Take by mouth as needed.      Marland Kitchen aspirin 81 MG tablet Take 81 mg by mouth daily.      Marland Kitchen atorvastatin (LIPITOR) 10 MG tablet Take 10 mg by mouth daily.      . Cholecalciferol (VITAMIN D) 2000 UNITS CAPS Take 2 capsules by mouth daily.      . fexofenadine (ALLEGRA) 180 MG tablet Take 180 mg by mouth daily.      Marland Kitchen glimepiride (AMARYL) 2 MG tablet Take 2 mg by mouth daily before breakfast.       . hydrochlorothiazide (HYDRODIURIL) 25 MG tablet Take 25 mg by mouth daily.      Marland Kitchen HYDROcodone-acetaminophen (NORCO) 5-325 MG per tablet Take 1 tablet by mouth every 6 (six) hours as needed.      Marland Kitchen MAGNESIUM CHLORIDE ER PO Take 3 tablets by mouth daily.      . metFORMIN (GLUCOPHAGE) 500 MG tablet Take 500 mg by mouth 2 (two) times daily with a meal.      . potassium chloride SA (K-DUR,KLOR-CON) 20 MEQ tablet Take 20 mEq by mouth daily.      . quinapril (ACCUPRIL) 20 MG tablet Take 20 mg by mouth at bedtime.      . verapamil (COVERA HS) 180 MG (CO) 24 hr tablet Take 180 mg by mouth at bedtime.        Allergies Penicillins; Calcium-containing compounds; Celebrex; Daypro; and Feldene  Family History: Family History  Problem Relation Age of Onset  . Hypertension    . Cancer    . Diabetes    . Heart disease      Social History: History   Social History  . Marital Status: Married    Spouse Name: N/A    Number of Children: N/A  . Years of Education: N/A   Occupational History  . Not on file.   Social History Main Topics  . Smoking status: Not on file  . Smokeless  tobacco: Not on file  . Alcohol Use: Not on file  . Drug Use: Not on file  . Sexually Active: Not on file   Other Topics Concern  . Not on file   Social History Narrative  . No narrative on file    Electrocardiogram: At primary office  SR rate 54 inferolateral T wave changes  Assessment and Plan

## 2011-12-14 NOTE — Assessment & Plan Note (Signed)
Etiology not clear.  DM with abnormal ECG ? Cardiomegaly on CXR and "pressure" with URI.  F/U lexiscan myovue and echo

## 2011-12-21 ENCOUNTER — Other Ambulatory Visit (HOSPITAL_COMMUNITY): Payer: Medicare Other

## 2011-12-22 ENCOUNTER — Other Ambulatory Visit: Payer: Self-pay

## 2011-12-22 ENCOUNTER — Ambulatory Visit (HOSPITAL_COMMUNITY): Payer: Medicare Other | Attending: Cardiovascular Disease

## 2011-12-22 DIAGNOSIS — I519 Heart disease, unspecified: Secondary | ICD-10-CM | POA: Insufficient documentation

## 2011-12-22 DIAGNOSIS — E669 Obesity, unspecified: Secondary | ICD-10-CM | POA: Insufficient documentation

## 2011-12-22 DIAGNOSIS — R0600 Dyspnea, unspecified: Secondary | ICD-10-CM

## 2011-12-22 DIAGNOSIS — I517 Cardiomegaly: Secondary | ICD-10-CM | POA: Insufficient documentation

## 2011-12-22 DIAGNOSIS — E785 Hyperlipidemia, unspecified: Secondary | ICD-10-CM | POA: Insufficient documentation

## 2011-12-22 DIAGNOSIS — I10 Essential (primary) hypertension: Secondary | ICD-10-CM | POA: Insufficient documentation

## 2011-12-22 DIAGNOSIS — R002 Palpitations: Secondary | ICD-10-CM | POA: Insufficient documentation

## 2011-12-22 DIAGNOSIS — R0609 Other forms of dyspnea: Secondary | ICD-10-CM | POA: Insufficient documentation

## 2011-12-22 DIAGNOSIS — E119 Type 2 diabetes mellitus without complications: Secondary | ICD-10-CM | POA: Insufficient documentation

## 2011-12-22 DIAGNOSIS — R0989 Other specified symptoms and signs involving the circulatory and respiratory systems: Secondary | ICD-10-CM | POA: Insufficient documentation

## 2011-12-27 ENCOUNTER — Ambulatory Visit (HOSPITAL_COMMUNITY): Payer: Medicare Other | Attending: Cardiology | Admitting: Radiology

## 2011-12-27 VITALS — BP 124/67 | Ht 59.0 in | Wt 196.0 lb

## 2011-12-27 DIAGNOSIS — R9431 Abnormal electrocardiogram [ECG] [EKG]: Secondary | ICD-10-CM | POA: Insufficient documentation

## 2011-12-27 DIAGNOSIS — I10 Essential (primary) hypertension: Secondary | ICD-10-CM | POA: Insufficient documentation

## 2011-12-27 DIAGNOSIS — R0789 Other chest pain: Secondary | ICD-10-CM | POA: Insufficient documentation

## 2011-12-27 DIAGNOSIS — R0609 Other forms of dyspnea: Secondary | ICD-10-CM | POA: Insufficient documentation

## 2011-12-27 DIAGNOSIS — R5381 Other malaise: Secondary | ICD-10-CM | POA: Insufficient documentation

## 2011-12-27 DIAGNOSIS — R0600 Dyspnea, unspecified: Secondary | ICD-10-CM

## 2011-12-27 DIAGNOSIS — E119 Type 2 diabetes mellitus without complications: Secondary | ICD-10-CM | POA: Insufficient documentation

## 2011-12-27 DIAGNOSIS — R002 Palpitations: Secondary | ICD-10-CM | POA: Insufficient documentation

## 2011-12-27 DIAGNOSIS — R079 Chest pain, unspecified: Secondary | ICD-10-CM

## 2011-12-27 DIAGNOSIS — R0602 Shortness of breath: Secondary | ICD-10-CM

## 2011-12-27 DIAGNOSIS — Z87891 Personal history of nicotine dependence: Secondary | ICD-10-CM | POA: Insufficient documentation

## 2011-12-27 DIAGNOSIS — E669 Obesity, unspecified: Secondary | ICD-10-CM | POA: Insufficient documentation

## 2011-12-27 DIAGNOSIS — E785 Hyperlipidemia, unspecified: Secondary | ICD-10-CM | POA: Insufficient documentation

## 2011-12-27 DIAGNOSIS — R0989 Other specified symptoms and signs involving the circulatory and respiratory systems: Secondary | ICD-10-CM | POA: Insufficient documentation

## 2011-12-27 DIAGNOSIS — R5383 Other fatigue: Secondary | ICD-10-CM | POA: Insufficient documentation

## 2011-12-27 MED ORDER — TECHNETIUM TC 99M TETROFOSMIN IV KIT
10.0000 | PACK | Freq: Once | INTRAVENOUS | Status: AC | PRN
  Administered 2011-12-27: 10 via INTRAVENOUS

## 2011-12-27 MED ORDER — TECHNETIUM TC 99M TETROFOSMIN IV KIT
30.0000 | PACK | Freq: Once | INTRAVENOUS | Status: AC | PRN
  Administered 2011-12-27: 30 via INTRAVENOUS

## 2011-12-27 MED ORDER — REGADENOSON 0.4 MG/5ML IV SOLN
0.4000 mg | Freq: Once | INTRAVENOUS | Status: AC
  Administered 2011-12-27: 0.4 mg via INTRAVENOUS

## 2011-12-27 NOTE — Progress Notes (Signed)
[facility] SITE 3 NUCLEAR MED [address] [place] [address] [phone]  Cardiology Nuclear Med Study  Tracey Todd is a 77 y.o. female     MRN : [id number]     DOB: 12-Jan-1935  Procedure Date: 12/27/2011  Nuclear Med Background Indication for Stress Test:  Evaluation for Ischemia and Abnormal EKG History:  '96 MPS:No report. Cardiac Risk Factors: History of Smoking, Hypertension, Lipids, NIDDM and Obesity  Symptoms:  Chest Tightness (last episode of chest discomfort was in March with a bronchitis), DOE, Fatigue, Palpitations and SOB   Nuclear Pre-Procedure Caffeine/Decaff Intake:  None NPO After: 7:30pm   Lungs:  Clear. O2 Sat: 98% on RA IV 0.9% NS with Angio Cath:  22g  IV Site: R Hand  IV Started by:  Cathlyn Parsons, RN  Chest Size (in):  40 Cup Size: D  Height: 4\' 11"  (1.499 m)  Weight:  196 lb (88.905 kg)  BMI:  Body mass index is 39.59 kg/(m^2). Tech Comments:  n/a    Nuclear Med Study 1 or 2 day study: 1 day  Stress Test Type:  Treadmill/Lexiscan  Reading MD: Olga Millers, MD  Order Authorizing Provider:  Charlton Haws, MD  Resting Radionuclide: Technetium 68m Tetrofosmin  Resting Radionuclide Dose: 11.0 mCi   Stress Radionuclide:  Technetium 34m Tetrofosmin  Stress Radionuclide Dose: 33.0 mCi           Stress Protocol Rest HR: 53 Stress HR: 111  Rest BP: 124/67 Stress BP: 167/70  Exercise Time (min): 2:00 METS: n/a   Predicted Max HR: 146 bpm % Max HR: 76.03 bpm Rate Pressure Product: 21308   Dose of Adenosine (mg):  n/a Dose of Lexiscan: 0.4 mg  Dose of Atropine (mg): n/a Dose of Dobutamine: n/a mcg/kg/min (at max HR)  Stress Test Technologist: Smiley Houseman, CMA-N  Nuclear Technologist:  Domenic Polite, CNMT     Rest Procedure:  Myocardial perfusion imaging was performed at rest 45 minutes following the intravenous administration of Technetium 48m Tetrofosmin.  Rest ECG: Nonspecific T-wave changes.  Stress Procedure:   The patient received IV Lexiscan 0.4 mg over 15-seconds with concurrent low level exercise and then Technetium 59m Tetrofosmin was injected at 30-seconds while the patient continued walking one more minute. There were no significant changes with Lexiscan, occasional PVC's were noted. Quantitative spect images were obtained after a 45-minute delay.  Stress ECG: No significant ST segment change suggestive of ischemia.  QPS Raw Data Images:  Acquisition technically good; normal left ventricular size. Stress Images:  Normal homogeneous uptake in all areas of the myocardium. Rest Images:  Normal homogeneous uptake in all areas of the myocardium. Subtraction (SDS):  No evidence of ischemia. Transient Ischemic Dilatation (Normal <1.22):  1.11 Lung/Heart Ratio (Normal <0.45):  0.27  Quantitative Gated Spect Images QGS EDV:  73 ml QGS ESV:  19 ml  Impression Exercise Capacity:  Lexiscan with low level exercise. BP Response:  Normal blood pressure response. Clinical Symptoms:  No chest pain or dyspnea. ECG Impression:  No significant ST segment change suggestive of ischemia. Comparison with Prior Nuclear Study: No images to compare  Overall Impression:  Normal stress nuclear study.  LV Ejection Fraction: 74%.  LV Wall Motion:  NL LV Function; NL Wall Motion   Olga Millers

## 2012-02-10 ENCOUNTER — Encounter: Payer: Self-pay | Admitting: Cardiovascular Disease

## 2012-03-09 ENCOUNTER — Other Ambulatory Visit: Payer: Self-pay | Admitting: Internal Medicine

## 2012-03-09 DIAGNOSIS — Z1231 Encounter for screening mammogram for malignant neoplasm of breast: Secondary | ICD-10-CM

## 2012-03-21 ENCOUNTER — Ambulatory Visit
Admission: RE | Admit: 2012-03-21 | Discharge: 2012-03-21 | Disposition: A | Discharge status: Home / Self Care | Payer: Medicare Other | Source: Ambulatory Visit | Attending: Internal Medicine | Admitting: Internal Medicine

## 2012-03-21 DIAGNOSIS — Z1231 Encounter for screening mammogram for malignant neoplasm of breast: Secondary | ICD-10-CM

## 2012-04-12 ENCOUNTER — Ambulatory Visit (HOSPITAL_BASED_OUTPATIENT_CLINIC_OR_DEPARTMENT_OTHER): Admission: RE | Admit: 2012-04-12 | Payer: Medicare Other | Source: Ambulatory Visit | Admitting: Orthopedic Surgery

## 2012-04-12 ENCOUNTER — Encounter (HOSPITAL_BASED_OUTPATIENT_CLINIC_OR_DEPARTMENT_OTHER): Admission: RE | Payer: Self-pay | Source: Ambulatory Visit

## 2012-04-12 SURGERY — RECESSION, MUSCLE, GASTROCNEMIUS
Anesthesia: Choice | Laterality: Left

## 2012-10-20 ENCOUNTER — Other Ambulatory Visit: Payer: Self-pay | Admitting: Physician Assistant

## 2012-10-20 DIAGNOSIS — R109 Unspecified abdominal pain: Secondary | ICD-10-CM

## 2012-10-25 ENCOUNTER — Ambulatory Visit
Admission: RE | Admit: 2012-10-25 | Discharge: 2012-10-25 | Disposition: A | Discharge status: Home / Self Care | Payer: Medicare Other | Source: Ambulatory Visit | Attending: Physician Assistant | Admitting: Physician Assistant

## 2012-10-25 DIAGNOSIS — R109 Unspecified abdominal pain: Secondary | ICD-10-CM

## 2012-10-27 ENCOUNTER — Other Ambulatory Visit (HOSPITAL_COMMUNITY): Payer: Self-pay | Admitting: Gastroenterology

## 2012-10-27 DIAGNOSIS — R1011 Right upper quadrant pain: Secondary | ICD-10-CM

## 2012-11-10 ENCOUNTER — Encounter (HOSPITAL_COMMUNITY): Payer: Medicare Other

## 2012-11-15 ENCOUNTER — Encounter (HOSPITAL_COMMUNITY)
Admission: RE | Admit: 2012-11-15 | Discharge: 2012-11-15 | Disposition: A | Discharge status: Home / Self Care | Payer: Medicare Other | Source: Ambulatory Visit | Attending: Gastroenterology | Admitting: Gastroenterology

## 2012-11-15 DIAGNOSIS — K838 Other specified diseases of biliary tract: Secondary | ICD-10-CM | POA: Insufficient documentation

## 2012-11-15 DIAGNOSIS — R1011 Right upper quadrant pain: Secondary | ICD-10-CM

## 2012-11-15 DIAGNOSIS — R109 Unspecified abdominal pain: Secondary | ICD-10-CM | POA: Insufficient documentation

## 2012-11-15 MED ORDER — TECHNETIUM TC 99M MEBROFENIN IV KIT
5.0000 | PACK | Freq: Once | INTRAVENOUS | Status: AC | PRN
  Administered 2012-11-15: 5 via INTRAVENOUS

## 2012-11-15 MED ORDER — SINCALIDE 5 MCG IJ SOLR
0.0200 ug/kg | Freq: Once | INTRAMUSCULAR | Status: DC
  Administered 2012-11-15: 1.78 ug via INTRAVENOUS

## 2012-11-30 ENCOUNTER — Other Ambulatory Visit (HOSPITAL_COMMUNITY)
Admission: RE | Admit: 2012-11-30 | Discharge: 2012-11-30 | Disposition: A | Discharge status: Home / Self Care | Payer: Medicare Other | Source: Ambulatory Visit | Attending: Internal Medicine | Admitting: Internal Medicine

## 2012-11-30 ENCOUNTER — Other Ambulatory Visit: Payer: Self-pay | Admitting: Emergency Medicine

## 2012-11-30 DIAGNOSIS — Z124 Encounter for screening for malignant neoplasm of cervix: Secondary | ICD-10-CM | POA: Insufficient documentation

## 2013-01-29 ENCOUNTER — Ambulatory Visit (HOSPITAL_COMMUNITY)
Admission: RE | Admit: 2013-01-29 | Discharge: 2013-01-29 | Disposition: A | Discharge status: Home / Self Care | Payer: Medicare Other | Source: Ambulatory Visit | Attending: Internal Medicine | Admitting: Internal Medicine

## 2013-01-29 ENCOUNTER — Other Ambulatory Visit (HOSPITAL_COMMUNITY): Payer: Self-pay | Admitting: Internal Medicine

## 2013-01-29 DIAGNOSIS — R05 Cough: Secondary | ICD-10-CM | POA: Insufficient documentation

## 2013-01-29 DIAGNOSIS — R059 Cough, unspecified: Secondary | ICD-10-CM

## 2013-01-29 DIAGNOSIS — I10 Essential (primary) hypertension: Secondary | ICD-10-CM | POA: Insufficient documentation

## 2013-01-29 DIAGNOSIS — R062 Wheezing: Secondary | ICD-10-CM | POA: Insufficient documentation

## 2013-02-13 DIAGNOSIS — Z96659 Presence of unspecified artificial knee joint: Secondary | ICD-10-CM | POA: Insufficient documentation

## 2013-02-13 HISTORY — DX: Presence of unspecified artificial knee joint: Z96.659

## 2013-03-05 ENCOUNTER — Other Ambulatory Visit: Payer: Self-pay

## 2013-03-05 DIAGNOSIS — Z1231 Encounter for screening mammogram for malignant neoplasm of breast: Secondary | ICD-10-CM

## 2013-03-28 ENCOUNTER — Ambulatory Visit
Admission: RE | Admit: 2013-03-28 | Discharge: 2013-03-28 | Disposition: A | Discharge status: Home / Self Care | Payer: Medicare Other | Source: Ambulatory Visit

## 2013-03-28 DIAGNOSIS — Z1231 Encounter for screening mammogram for malignant neoplasm of breast: Secondary | ICD-10-CM

## 2013-05-14 ENCOUNTER — Encounter: Payer: Self-pay | Admitting: Podiatrist

## 2013-05-14 DIAGNOSIS — B351 Tinea unguium: Secondary | ICD-10-CM

## 2013-05-14 DIAGNOSIS — M722 Plantar fascial fibromatosis: Secondary | ICD-10-CM

## 2013-05-30 ENCOUNTER — Encounter: Payer: Self-pay | Admitting: Podiatrist

## 2013-05-30 ENCOUNTER — Ambulatory Visit: Payer: Self-pay | Admitting: Podiatrist

## 2013-05-30 ENCOUNTER — Ambulatory Visit (INDEPENDENT_AMBULATORY_CARE_PROVIDER_SITE_OTHER): Payer: Medicare Other | Admitting: Podiatrist

## 2013-05-30 DIAGNOSIS — B351 Tinea unguium: Secondary | ICD-10-CM

## 2013-05-30 DIAGNOSIS — M79609 Pain in unspecified limb: Secondary | ICD-10-CM

## 2013-05-30 NOTE — Progress Notes (Addendum)
HPI: Patient presents today for follow up of foot and nail care. Denies any new complaints today.  Objective: Patients chart is reviewed. Neurovascular status unchanged. Patients nails are thickened, discolored, distrophic, friable and brittle with yellow-brown discoloration. Patient subjectively relates they are painful with shoes and with ambulation.both feet  Assessment: Symptomatic onychomycosis  Plan: Discussed treatment options and alternatives. The symptomatic toenails were debrided through manual means only without complication. Return appointment recommended at routine intervals of 3 months  Note- patient requests no mechanical debridement- only nail trim  

## 2013-05-30 NOTE — Patient Instructions (Addendum)
Return in 3 months for routine care and call if any problems arise in the interim

## 2013-07-06 ENCOUNTER — Telehealth: Payer: Self-pay | Admitting: Emergency Medicine

## 2013-07-06 NOTE — Telephone Encounter (Signed)
Patient aware of negative urine results

## 2013-07-06 NOTE — Telephone Encounter (Signed)
Pt was told call would be made to give her the urine results.  What is status.

## 2013-07-06 NOTE — Telephone Encounter (Signed)
Patient will be READVISED urine negative.

## 2013-07-10 ENCOUNTER — Ambulatory Visit: Payer: Self-pay | Admitting: Emergency Medicine

## 2013-07-24 ENCOUNTER — Other Ambulatory Visit: Payer: Self-pay | Admitting: Internal Medicine

## 2013-07-25 ENCOUNTER — Other Ambulatory Visit: Payer: Self-pay | Admitting: Internal Medicine

## 2013-07-25 NOTE — Telephone Encounter (Signed)
RX complete

## 2013-08-01 ENCOUNTER — Other Ambulatory Visit: Payer: Self-pay | Admitting: Physician Assistant

## 2013-08-01 DIAGNOSIS — E1149 Type 2 diabetes mellitus with other diabetic neurological complication: Secondary | ICD-10-CM

## 2013-08-01 NOTE — Telephone Encounter (Signed)
Refaxed RX again to fax # [phone] reverified fax # with [facility], this is the third time faxed and all faxes were confirmed as sent, advise patient if not received she will have to pick up RX and take it herself or we will mail her the RX

## 2013-08-07 ENCOUNTER — Other Ambulatory Visit: Payer: Self-pay | Admitting: Internal Medicine

## 2013-08-07 MED ORDER — QUINAPRIL HCL 20 MG PO TABS: 20.0000 mg | ORAL_TABLET | Freq: Two times a day (BID) | ORAL | Status: DC

## 2013-08-07 MED ORDER — HYDROCHLOROTHIAZIDE 25 MG PO TABS: 25.0000 mg | ORAL_TABLET | Freq: Every day | ORAL | Status: DC

## 2013-08-07 MED ORDER — VERAPAMIL HCL 180 MG (CO) PO TB24: 180.0000 mg | ORAL_TABLET | Freq: Every day | ORAL | Status: DC

## 2013-08-07 MED ORDER — POTASSIUM CHLORIDE CRYS ER 20 MEQ PO TBCR: 20.0000 meq | EXTENDED_RELEASE_TABLET | Freq: Every day | ORAL | Status: DC

## 2013-08-08 DIAGNOSIS — Z0000 Encounter for general adult medical examination without abnormal findings: Secondary | ICD-10-CM

## 2013-08-21 ENCOUNTER — Other Ambulatory Visit: Payer: Self-pay | Admitting: Internal Medicine

## 2013-09-03 ENCOUNTER — Ambulatory Visit (INDEPENDENT_AMBULATORY_CARE_PROVIDER_SITE_OTHER): Payer: Medicare Other | Admitting: Physician Assistant

## 2013-09-03 ENCOUNTER — Encounter: Payer: Self-pay | Admitting: Physician Assistant

## 2013-09-03 VITALS — BP 143/70 | HR 76 | Temp 99.7°F | Resp 16 | Ht 58.5 in | Wt 195.0 lb

## 2013-09-03 DIAGNOSIS — J209 Acute bronchitis, unspecified: Secondary | ICD-10-CM

## 2013-09-03 MED ORDER — AZITHROMYCIN 250 MG PO TABS: ORAL_TABLET | ORAL | Status: DC

## 2013-09-03 MED ORDER — PROMETHAZINE-CODEINE 6.25-10 MG/5ML PO SYRP: 5.0000 mL | ORAL_SOLUTION | Freq: Four times a day (QID) | ORAL | Status: DC | PRN

## 2013-09-03 MED ORDER — PREDNISONE 20 MG PO TABS: ORAL_TABLET | ORAL | Status: DC

## 2013-09-03 NOTE — Progress Notes (Signed)
   Subjective:    Patient ID: Tracey Todd, female    DOB: 09/28/1934, 79 y.o.   MRN: [id number]  Cough This is a new problem. The current episode started in the past 7 days. The problem has been gradually worsening. The problem occurs constantly. The cough is productive of purulent sputum. Associated symptoms include chills, ear congestion, headaches, nasal congestion, postnasal drip, a sore throat, shortness of breath and wheezing. Pertinent negatives include no chest pain, ear pain, fever, heartburn, hemoptysis, myalgias, rash, rhinorrhea, sweats or weight loss. She has tried OTC cough suppressant for the symptoms. The treatment provided no relief. Her past medical history is significant for COPD.    Review of Systems  Constitutional: Positive for chills and fatigue. Negative for fever and weight loss.  HENT: Positive for postnasal drip and sore throat. Negative for congestion, ear pain, rhinorrhea, sinus pressure, trouble swallowing and voice change.   Eyes: Negative.   Respiratory: Positive for cough, shortness of breath and wheezing. Negative for hemoptysis.   Cardiovascular: Negative.  Negative for chest pain.  Gastrointestinal: Negative.  Negative for heartburn.  Genitourinary: Negative.   Musculoskeletal: Negative for myalgias.  Skin: Negative for rash.  Neurological: Positive for headaches. Negative for dizziness, facial asymmetry, speech difficulty, light-headedness and numbness.      Objective:   Physical Exam  Constitutional: She is oriented to person, place, and time. She appears well-developed and well-nourished.  HENT:  Head: Normocephalic and atraumatic.  Right Ear: External ear normal.  Left Ear: External ear normal.  Nose: Nose normal.  Mouth/Throat: Oropharynx is clear and moist.  Eyes: Conjunctivae are normal. Pupils are equal, round, and reactive to light.  Neck: Normal range of motion. Neck supple.  Cardiovascular: Normal rate and regular rhythm.    Pulmonary/Chest: Effort normal. No respiratory distress. She has wheezes. She has no rales. She exhibits no tenderness.  Abdominal: Soft. Bowel sounds are normal.  Lymphadenopathy:    She has no cervical adenopathy.  Neurological: She is alert and oriented to person, place, and time.  Skin: Skin is warm and dry.      Assessment & Plan:  Acute bronchitis - Plan: azithromycin (ZITHROMAX) 250 MG tablet, predniSONE (DELTASONE) 20 MG tablet, promethazine-codeine (PHENERGAN WITH CODEINE) 6.25-10 MG/5ML syrup

## 2013-09-03 NOTE — Patient Instructions (Signed)

## 2013-09-04 ENCOUNTER — Other Ambulatory Visit: Payer: Self-pay | Admitting: Physician Assistant

## 2013-09-04 MED ORDER — ALBUTEROL SULFATE HFA 108 (90 BASE) MCG/ACT IN AERS: 2.0000 | INHALATION_SPRAY | Freq: Four times a day (QID) | RESPIRATORY_TRACT | Status: DC | PRN

## 2013-09-05 ENCOUNTER — Ambulatory Visit: Payer: Medicare Other | Admitting: Podiatrist

## 2013-09-26 ENCOUNTER — Other Ambulatory Visit: Payer: Self-pay | Admitting: Physician Assistant

## 2013-09-27 DIAGNOSIS — K760 Fatty (change of) liver, not elsewhere classified: Secondary | ICD-10-CM | POA: Insufficient documentation

## 2013-09-27 DIAGNOSIS — E782 Mixed hyperlipidemia: Secondary | ICD-10-CM | POA: Insufficient documentation

## 2013-09-27 DIAGNOSIS — M1990 Unspecified osteoarthritis, unspecified site: Secondary | ICD-10-CM | POA: Insufficient documentation

## 2013-09-28 ENCOUNTER — Encounter: Payer: Self-pay | Admitting: Internal Medicine

## 2013-09-28 ENCOUNTER — Ambulatory Visit (INDEPENDENT_AMBULATORY_CARE_PROVIDER_SITE_OTHER): Payer: Medicare Other | Admitting: Internal Medicine

## 2013-09-28 VITALS — BP 130/78 | HR 80 | Temp 97.9°F | Resp 16 | Wt 195.2 lb

## 2013-09-28 DIAGNOSIS — E782 Mixed hyperlipidemia: Secondary | ICD-10-CM

## 2013-09-28 DIAGNOSIS — E559 Vitamin D deficiency, unspecified: Secondary | ICD-10-CM

## 2013-09-28 DIAGNOSIS — E119 Type 2 diabetes mellitus without complications: Secondary | ICD-10-CM

## 2013-09-28 DIAGNOSIS — Z79899 Other long term (current) drug therapy: Secondary | ICD-10-CM

## 2013-09-28 DIAGNOSIS — J0410 Acute tracheitis without obstruction: Secondary | ICD-10-CM

## 2013-09-28 DIAGNOSIS — I10 Essential (primary) hypertension: Secondary | ICD-10-CM

## 2013-09-28 LAB — BASIC METABOLIC PANEL WITH GFR
BUN: 13 mg/dL (ref 6–23)
CHLORIDE: 103 meq/L (ref 96–112)
CO2: 29 mEq/L (ref 19–32)
CREATININE: 0.82 mg/dL (ref 0.50–1.10)
Calcium: 9.6 mg/dL (ref 8.4–10.5)
GFR, Est African American: 80 mL/min
GFR, Est Non African American: 70 mL/min
GLUCOSE: 84 mg/dL (ref 70–99)
POTASSIUM: 4.3 meq/L (ref 3.5–5.3)
Sodium: 139 mEq/L (ref 135–145)

## 2013-09-28 LAB — HEMOGLOBIN A1C
HEMOGLOBIN A1C: 6.7 % — AB (ref <5.7)
MEAN PLASMA GLUCOSE: 146 mg/dL — AB (ref <117)

## 2013-09-28 LAB — HEPATIC FUNCTION PANEL
ALBUMIN: 4 g/dL (ref 3.5–5.2)
ALK PHOS: 48 U/L (ref 39–117)
ALT: 9 U/L (ref 0–35)
AST: 15 U/L (ref 0–37)
BILIRUBIN INDIRECT: 0.5 mg/dL (ref 0.2–1.2)
Bilirubin, Direct: 0.1 mg/dL (ref 0.0–0.3)
TOTAL PROTEIN: 6.7 g/dL (ref 6.0–8.3)
Total Bilirubin: 0.6 mg/dL (ref 0.2–1.2)

## 2013-09-28 LAB — MAGNESIUM: Magnesium: 1.7 mg/dL (ref 1.5–2.5)

## 2013-09-28 LAB — LIPID PANEL
Cholesterol: 195 mg/dL (ref 0–200)
HDL: 55 mg/dL (ref >39)
LDL CALC: 121 mg/dL — AB (ref 0–99)
TRIGLYCERIDES: 94 mg/dL (ref <150)
Total CHOL/HDL Ratio: 3.5 Ratio
VLDL: 19 mg/dL (ref 0–40)

## 2013-09-28 LAB — TSH: TSH: 2.551 u[IU]/mL (ref 0.350–4.500)

## 2013-09-28 MED ORDER — PREDNISONE 10 MG PO TABS: 10.0000 mg | ORAL_TABLET | ORAL | Status: DC

## 2013-09-28 MED ORDER — LEVOFLOXACIN 500 MG PO TABS: 500.0000 mg | ORAL_TABLET | Freq: Every day | ORAL | Status: AC

## 2013-09-28 NOTE — Patient Instructions (Signed)

## 2013-09-28 NOTE — Progress Notes (Signed)
Patient ID: Tracey Todd, female   DOB: February 05, 1935, 79 y.o.   MRN: [id number]   This very nice 79 y.o. WBF presents for 3 month follow up with Hypertension, Hyperlipidemia, T2 NIDDM and Vitamin D Deficiency. She was treated about 3 weeks ago for a lower respiratory infection with a Z pak and reports initial improvement with gradual return of symptoms of cough productive of  A yellowish purulent sputum.   HTN predates since 1995. BP has been controlled at home. Today's BP: 130/78 mmHg. She reports home BP's range in the 120/60's. Patient denies any cardiac type chest pain, palpitations, dyspnea/orthopnea/PND, dizziness, claudication, or dependent edema.   Hyperlipidemia is controlled with diet & meds. Last Cholesterol was 193, Triglycerides were 142, HDL 59 and LDL 106 in Oct 32951 - near goal. Patient denies myalgias or other med SE's.    Also, the patient has history of Morbid Obesity (BMI 44) and T2 NIDDM since 1997 with last A1c of 7.0% in Oct 2014. She reports FBG's are less than 115 mg%. Patient denies any symptoms of reactive hypoglycemia, diabetic polys, paresthesias or visual blurring.   Further, Patient has history of Vitamin D Deficiency with last vitamin D of 44 in Oct 2014. Patient supplements vitamin D without any suspected side-effects.    Medication List       albuterol 108 (90 BASE) MCG/ACT inhaler  Commonly known as:  PROVENTIL HFA;VENTOLIN HFA  Inhale 2 puffs into the lungs every 6 (six) hours as needed for wheezing or shortness of breath.     ARTHRITIS PAIN RELIEVER PO  Take by mouth as needed.     aspirin 81 MG tablet  Take 81 mg by mouth daily.     freestyle lancets  USE LANCETS TO CHECK GLUCOSE EVERY DAY     FREESTYLE LITE test strip  Generic drug:  glucose blood  USE ONE STRIP TO CHECK GLUCOSE ONCE DAILY     glimepiride 2 MG tablet  Commonly known as:  AMARYL  Take 2 mg by mouth daily before breakfast.     hydrochlorothiazide 25 MG tablet  Commonly known  as:  HYDRODIURIL  Take 1 tablet (25 mg total) by mouth daily.     HYDROcodone-acetaminophen 5-325 MG per tablet  Commonly known as:  NORCO/VICODIN  Take 1 tablet by mouth every 6 (six) hours as needed.     levofloxacin 500 MG tablet                                   (Today)  Commonly known as:  LEVAQUIN  Take 1 tablet (500 mg total) by mouth daily.     MAGNESIUM CHLORIDE ER PO  Take 3 tablets by mouth daily.     metFORMIN 500 MG tablet  Commonly known as:  GLUCOPHAGE  Take 500 mg by mouth 2 (two) times daily with a meal.     potassium chloride SA 20 MEQ tablet  Commonly known as:  K-DUR,KLOR-CON  Take 1 tablet (20 mEq total) by mouth daily.     pravastatin 40 MG tablet  Commonly known as:  PRAVACHOL  TAKE ONE TABLET BY MOUTH AT BEDTIME FOR  CHOLESTEROL     predniSONE 10 MG tablet                                (Today)  Commonly known as:  DELTASONE  Take 1 tablet (10 mg total) by mouth See admin instructions. 1 tab 3 x day for 3 days, then 1 tab 2 x day for 3 days, then 1 tab 1 x day for 5 days     promethazine-codeine 6.25-10 MG/5ML syrup  Commonly known as:  PHENERGAN with CODEINE  Take 5 mLs by mouth every 6 (six) hours as needed for cough.     quinapril 20 MG tablet  Commonly known as:  ACCUPRIL  Take 1 tablet (20 mg total) by mouth 2 (two) times daily.     verapamil 180 MG (CO) 24 hr tablet  Commonly known as:  COVERA HS  Take 1 tablet (180 mg total) by mouth at bedtime.     Vitamin D 2000 UNITS Caps  Take 2 capsules by mouth daily.     ZYRTEC ALLERGY PO  Take 1 tablet by mouth daily.         Allergies  Allergen Reactions  . Penicillins Anaphylaxis  . Calcium-Containing Compounds   . Celebrex [Celecoxib]   . Daypro [Oxaprozin]   . Feldene [Piroxicam]     PMHx:   Past Medical History  Diagnosis Date  . Vitamin D deficiency   . Nodule of chest wall   . Heel pain     left foot  . Hypercholesteremia   . HTN (hypertension)   . Type II or  unspecified type diabetes mellitus without mention of complication, not stated as uncontrolled   . DJD (degenerative joint disease)   . Fatty liver     FHx:    Reviewed / unchanged  SHx:    Reviewed / unchanged  Systems Review: Constitutional: Denies fever, chills, wt changes, headaches, insomnia, fatigue, night sweats, change in appetite. Eyes: Denies redness, blurred vision, diplopia, discharge, itchy, watery eyes.  ENT: Denies discharge, congestion, post nasal drip, epistaxis, sore throat, earache, hearing loss, dental pain, tinnitus, vertigo, sinus pain, snoring.  CV: Denies chest pain, palpitations, irregular heartbeat, syncope, dyspnea, diaphoresis, orthopnea, PND, claudication, edema. Respiratory: denies cough, dyspnea, DOE, pleurisy, hoarseness, laryngitis, wheezing.  Gastrointestinal: Denies dysphagia, odynophagia, heartburn, reflux, water brash, abdominal pain or cramps, nausea, vomiting, bloating, diarrhea, constipation, hematemesis, melena, hematochezia,  or hemorrhoids. Genitourinary: Denies dysuria, frequency, urgency, nocturia, hesitancy, discharge, hematuria, flank pain. Musculoskeletal: Denies arthralgias, myalgias, stiffness, jt. swelling, pain, limp, strain/sprain.  Skin: Denies pruritus, rash, hives, warts, acne, eczema, change in skin lesion(s). Neuro: No weakness, tremor, incoordination, spasms, paresthesia, or pain. Psychiatric: Denies confusion, memory loss, or sensory loss. Endo: Denies change in weight, skin, hair change.  Heme/Lymph: No excessive bleeding, bruising, orenlarged lymph nodes.  BP: 130/78  Pulse: 80  Temp: 97.9 F (36.6 C)  Resp: 16    Estimated body mass index is 40.1 kg/(m^2) as calculated from the following:   Height as of 09/03/13: 4' 10.5" (1.486 m).   Weight as of this encounter: 195 lb 3.2 oz (88.542 kg).  On Exam: Appears well nourished - in no distress. Eyes: PERRLA, EOMs, conjunctiva no swelling or erythema. Sinuses: No  frontal/maxillary tenderness ENT/Mouth: EAC's clear, TM's nl w/o erythema, bulging. Nares clear w/o erythema, swelling, exudates. Oropharynx clear without erythema or exudates. Oral hygiene is good. Tongue normal, non obstructing. Hearing intact.  Neck: Supple. Thyroid nl. Car 2+/2+ without bruits, nodes or JVD. Chest: Respirations nl with BS equal  And scattered rales, rhonchi and no wheezing or stridor.  Cor: Heart sounds normal w/ regular rate and rhythm without sig. murmurs, gallops, clicks, or rubs. Peripheral  pulses normal and equal  without edema.  Abdomen: Soft & bowel sounds normal. Non-tender w/o guarding, rebound, hernias, masses, or organomegaly.  Lymphatics: Unremarkable.  Musculoskeletal: Full ROM all peripheral extremities, joint stability, 5/5 strength, and normal gait.  Skin: Warm, dry without exposed rashes, lesions, ecchymosis apparent.  Neuro: Cranial nerves intact, reflexes equal bilaterally. Sensory-motor testing grossly intact. Tendon reflexes grossly intact.  Pysch: Alert & oriented x 3. Insight and judgement nl & appropriate. No ideations.  Assessment and Plan:  1. Hypertension - Continue monitor blood pressure at home. Continue diet/meds same.  2. Hyperlipidemia - Continue diet/meds, exercise,& lifestyle modifications. Continue monitor periodic cholesterol/liver & renal functions   3. T2 NIDDM - continue recommend prudent low glycemic diet, weight control, regular exercise, diabetic monitoring and periodic eye exams.  4. Vitamin D Deficiency - Continue supplementation.  5. Morbid Obesity  6. Tracheitis - treat relapse with Levaquin and prednisone pulse/taper.  Recommended  Stricter diet and weight loss, regular exercise, BP monitoring, weight control, and discussed med and SE's. Recommended labs to assess and monitor clinical status. Further disposition pending results of labs.

## 2013-09-29 LAB — CBC WITH DIFFERENTIAL/PLATELET
BASOS ABS: 0.1 10*3/uL (ref 0.0–0.1)
Basophils Relative: 1 % (ref 0–1)
EOS PCT: 4 % (ref 0–5)
Eosinophils Absolute: 0.2 10*3/uL (ref 0.0–0.7)
HEMATOCRIT: 37.8 % (ref 36.0–46.0)
HEMOGLOBIN: 12.2 g/dL (ref 12.0–15.0)
LYMPHS PCT: 35 % (ref 12–46)
Lymphs Abs: 2.1 10*3/uL (ref 0.7–4.0)
MCH: 28.2 pg (ref 26.0–34.0)
MCHC: 32.3 g/dL (ref 30.0–36.0)
MCV: 87.5 fL (ref 78.0–100.0)
MONO ABS: 0.7 10*3/uL (ref 0.1–1.0)
MONOS PCT: 12 % (ref 3–12)
NEUTROS ABS: 2.9 10*3/uL (ref 1.7–7.7)
Neutrophils Relative %: 48 % (ref 43–77)
Platelets: 250 10*3/uL (ref 150–400)
RBC: 4.32 MIL/uL (ref 3.87–5.11)
RDW: 16.3 % — AB (ref 11.5–15.5)
WBC: 6 10*3/uL (ref 4.0–10.5)

## 2013-09-29 LAB — INSULIN, FASTING: INSULIN FASTING, SERUM: 35 u[IU]/mL — AB (ref 3–28)

## 2013-09-29 LAB — VITAMIN D 25 HYDROXY (VIT D DEFICIENCY, FRACTURES): Vit D, 25-Hydroxy: 69 ng/mL (ref 30–89)

## 2013-11-16 ENCOUNTER — Other Ambulatory Visit: Payer: Self-pay | Admitting: Physician Assistant

## 2013-12-03 ENCOUNTER — Encounter: Payer: Self-pay | Admitting: Emergency Medicine

## 2013-12-05 ENCOUNTER — Ambulatory Visit (INDEPENDENT_AMBULATORY_CARE_PROVIDER_SITE_OTHER): Payer: Medicare Other | Admitting: Emergency Medicine

## 2013-12-05 ENCOUNTER — Encounter: Payer: Self-pay | Admitting: Emergency Medicine

## 2013-12-05 VITALS — BP 124/69 | HR 52 | Temp 98.0°F | Resp 16 | Wt 194.0 lb

## 2013-12-05 DIAGNOSIS — E782 Mixed hyperlipidemia: Secondary | ICD-10-CM

## 2013-12-05 DIAGNOSIS — Z23 Encounter for immunization: Secondary | ICD-10-CM

## 2013-12-05 DIAGNOSIS — Z0000 Encounter for general adult medical examination without abnormal findings: Secondary | ICD-10-CM

## 2013-12-05 DIAGNOSIS — M810 Age-related osteoporosis without current pathological fracture: Secondary | ICD-10-CM

## 2013-12-05 DIAGNOSIS — E119 Type 2 diabetes mellitus without complications: Secondary | ICD-10-CM

## 2013-12-05 DIAGNOSIS — E1149 Type 2 diabetes mellitus with other diabetic neurological complication: Secondary | ICD-10-CM

## 2013-12-05 DIAGNOSIS — E669 Obesity, unspecified: Secondary | ICD-10-CM

## 2013-12-05 DIAGNOSIS — Z1331 Encounter for screening for depression: Secondary | ICD-10-CM

## 2013-12-05 DIAGNOSIS — I10 Essential (primary) hypertension: Secondary | ICD-10-CM

## 2013-12-05 DIAGNOSIS — Z789 Other specified health status: Secondary | ICD-10-CM

## 2013-12-05 MED ORDER — GLIMEPIRIDE 2 MG PO TABS: 2.0000 mg | ORAL_TABLET | Freq: Every day | ORAL | Status: DC

## 2013-12-05 MED ORDER — METFORMIN HCL 500 MG PO TABS: 500.0000 mg | ORAL_TABLET | Freq: Two times a day (BID) | ORAL | Status: DC

## 2013-12-05 MED ORDER — PRAVASTATIN SODIUM 40 MG PO TABS: ORAL_TABLET | ORAL | Status: DC

## 2013-12-05 NOTE — Patient Instructions (Signed)
Bad carbs also include fruit juice, alcohol, and sweet tea. These are empty calories that do not signal to your brain that you are full.   Please remember the good carbs are still carbs which convert into sugar. So please measure them out no more than 1/2-1 cup of rice, oatmeal, pasta, and beans.  Veggies are however free foods! Pile them on.   I like lean protein at every meal such as chicken, [place], pork chops, cottage cheese, etc. Just do not fry these meats and please center your meal around vegetable, the meats should be a side dish.   No all fruit is created equal. Please see the list below, the fruit at the bottom is higher in sugars than the fruit at the top   We want weight loss that will last so you should lose 1-2 pounds a week.  THAT IS IT! Please pick THREE things a month to change. Once it is a habit check off the item. Then pick another three items off the list to become habits.  If you are already doing a habit on the list GREAT!  Cross that item off! o Don't drink your calories. Ie, alcohol, soda, fruit juice, and sweet tea.  o Drink more water. Drink a glass when you feel hungry or before each meal.  o Eat breakfast - Complex carb and protein (likeDannon light and fit yogurt, oatmeal, fruit, eggs, [place] bacon). o Measure your cereal.  Eat no more than one cup a day. (ie [place]) o Eat an apple a day. o Add a vegetable a day. o Try a new vegetable a month. o Use Pam! Stop using oil or butter to cook. o Don't finish your plate or use smaller plates. o Share your dessert. o Eat sugar free Jello for dessert or frozen grapes. o Don't eat 2-3 hours before bed. o Switch to whole wheat bread, pasta, and brown rice. o Make healthier choices when you eat out. No fries! o Pick baked chicken, NOT fried. o Don't forget to SLOW DOWN when you eat. It is not going anywhere.  o Take the stairs. o Park far away in the parking lot o [employer] (or weights) for 10 minutes while  watching TV. o Walk at work for 10 minutes during break. o Walk outside 1 time a week with your friend, kids, dog, or significant other. o Start a walking group at church. o Walk the mall as much as you can tolerate.  o Keep a food diary. o Weigh yourself daily. o Walk for 15 minutes 3 days per week. o Cook at home more often and eat out less.  If life happens and you go back to old habits, it is okay.  Just start over. You can do it!   If you experience chest pain, get short of breath, or tired during the exercise, please stop immediately and inform your doctor.  Diabetes and Exercise Exercising regularly is important. It is not just about losing weight. It has many health benefits, such as:  Improving your overall fitness, flexibility, and endurance.  Increasing your bone density.  Helping with weight control.  Decreasing your body fat.  Increasing your muscle strength.  Reducing stress and tension.  Improving your overall health. People with diabetes who exercise gain additional benefits because exercise:  Reduces appetite.  Improves the body's use of blood sugar (glucose).  Helps lower or control blood glucose.  Decreases blood pressure.  Helps control blood lipids (such as  cholesterol and triglycerides).  Improves the body's use of the hormone insulin by:  Increasing the body's insulin sensitivity.  Reducing the body's insulin needs.  Decreases the risk for heart disease because exercising:  Lowers cholesterol and triglycerides levels.  Increases the levels of good cholesterol (such as high-density lipoproteins [HDL]) in the body.  Lowers blood glucose levels. YOUR ACTIVITY PLAN  Choose an activity that you enjoy and set realistic goals. Your health care provider or diabetes educator can help you make an activity plan that works for you. You can break activities into 2 or 3 sessions throughout the day. Doing so is as good as one long session. Exercise ideas  include:  Taking the dog for a walk.  Taking the stairs instead of the elevator.  Dancing to your favorite song.  Doing your favorite exercise with a friend. RECOMMENDATIONS FOR EXERCISING WITH TYPE 1 OR TYPE 2 DIABETES   Check your blood glucose before exercising. If blood glucose levels are greater than 240 mg/dL, check for urine ketones. Do not exercise if ketones are present.  Avoid injecting insulin into areas of the body that are going to be exercised. For example, avoid injecting insulin into:  The arms when playing tennis.  The legs when jogging.  Keep a record of:  Food intake before and after you exercise.  Expected peak times of insulin action.  Blood glucose levels before and after you exercise.  The type and amount of exercise you have done.  Review your records with your health care provider. Your health care provider will help you to develop guidelines for adjusting food intake and insulin amounts before and after exercising.  If you take insulin or oral hypoglycemic agents, watch for signs and symptoms of hypoglycemia. They include:  Dizziness.  Shaking.  Sweating.  Chills.  Confusion.  Drink plenty of water while you exercise to prevent dehydration or heat stroke. Body water is lost during exercise and must be replaced.  Talk to your health care provider before starting an exercise program to make sure it is safe for you. Remember, almost any type of activity is better than none. Document Released: 11/06/2003 Document Revised: 04/18/2013 Document Reviewed: 01/23/2013 [facility] Patient Information 2014 [place], [place]. Pneumococcal Vaccine, Polyvalent solution for injection What is this medicine? PNEUMOCOCCAL VACCINE, POLYVALENT (NEU mo KOK al vak SEEN, pol ee VEY luhnt) is a vaccine to prevent pneumococcus bacteria infection. These bacteria are a major cause of ear infections, Strep throat infections, and serious pneumonia, meningitis, or blood  infections worldwide. These vaccines help the body to produce antibodies (protective substances) that help your body defend against these bacteria. This vaccine is recommended for people 67 years of age and older with health problems. It is also recommended for all adults over 29 years old. This vaccine will not treat an infection. This medicine may be used for other purposes; ask your health care provider or pharmacist if you have questions. COMMON BRAND NAME(S): Pneumovax 23 What should I tell my health care provider before I take this medicine? They need to know if you have any of these conditions: -bleeding problems -bone marrow or organ transplant -cancer, Hodgkin's disease -fever -infection -immune system problems -low platelet count in the blood -seizures -an unusual or allergic reaction to pneumococcal vaccine, diphtheria toxoid, other vaccines, latex, other medicines, foods, dyes, or preservatives -pregnant or trying to get pregnant -breast-feeding How should I use this medicine? This vaccine is for injection into a muscle or under the skin. It  is given by a health care professional. A copy of Vaccine Information Statements will be given before each vaccination. Read this sheet carefully each time. The sheet may change frequently. Talk to your pediatrician regarding the use of this medicine in children. While this drug may be prescribed for children as young as 702 years of age for selected conditions, precautions do apply. Overdosage: If you think you have taken too much of this medicine contact a poison control center or emergency room at once. NOTE: This medicine is only for you. Do not share this medicine with others. What if I miss a dose? It is important not to miss your dose. Call your doctor or health care professional if you are unable to keep an appointment. What may interact with this medicine? -medicines for cancer chemotherapy -medicines that suppress your immune  function -medicines that treat or prevent blood clots like warfarin, enoxaparin, and dalteparin -steroid medicines like prednisone or cortisone This list may not describe all possible interactions. Give your health care provider a list of all the medicines, herbs, non-prescription drugs, or dietary supplements you use. Also tell them if you smoke, drink alcohol, or use illegal drugs. Some items may interact with your medicine. What should I watch for while using this medicine? Mild fever and pain should go away in 3 days or less. Report any unusual symptoms to your doctor or health care professional. What side effects may I notice from receiving this medicine? Side effects that you should report to your doctor or health care professional as soon as possible: -allergic reactions like skin rash, itching or hives, swelling of the face, lips, or tongue -breathing problems -confused -fever over 102 degrees F -pain, tingling, numbness in the hands or feet -seizures -unusual bleeding or bruising -unusual muscle weakness Side effects that usually do not require medical attention (report to your doctor or health care professional if they continue or are bothersome): -aches and pains -diarrhea -fever of 102 degrees F or less -headache -irritable -loss of appetite -pain, tender at site where injected -trouble sleeping This list may not describe all possible side effects. Call your doctor for medical advice about side effects. You may report side effects to FDA at 1-800-FDA-1088. Where should I keep my medicine? This does not apply. This vaccine is given in a clinic, pharmacy, doctor's office, or other health care setting and will not be stored at home. NOTE: This sheet is a summary. It may not cover all possible information. If you have questions about this medicine, talk to your doctor, pharmacist, or health care provider.  2014, Elsevier/Gold Standard. (2008-03-22 14:32:37)

## 2013-12-05 NOTE — Progress Notes (Signed)
Patient ID: Tracey Todd, female   DOB: 26-Sep-1934, 79 y.o.   MRN: [id number] Subjective:   Tracey Todd is a 79 y.o. female who presents for Medicare Annual Wellness Visit and complete physical.    Date of last medicare wellness visit is unknown.  She has mild increased stress with recent deaths in family, but dealing with them.  She has mild increased left knee pain with weight gain, but has improved with increased activity.  She notes increased allergy drainage but notes it is clear.  Her blood pressure has been controlled at home, today their BP is BP: 124/69 mmHg She does not workout. She denies chest pain, shortness of breath, dizziness.  She is on cholesterol medication and denies myalgias. Her cholesterol is not at goal. The cholesterol last visit was:   Lab Results  Component Value Date   CHOL 195 09/28/2013   HDL 55 09/28/2013   LDLCALC 121* 09/28/2013   TRIG 94 09/28/2013   CHOLHDL 3.5 09/28/2013   She has not been working on diet and exercise for diabetes, and denies nausea, polydipsia and polyuria. Last A1C in the office was:  Lab Results  Component Value Date   HGBA1C 6.7* 09/28/2013   Patient is on Vitamin D supplement.     Names of Other Physician/Practitioners you currently use: Patient Care Team: Lucky Cowboy, MD as PCP - General (Internal Medicine) Delon Sacramento, MD as Consulting Physician (Ophthalmology) Lewayne Bunting, MD as Consulting Physician (Cardiology) Wendall Stade, MD as Consulting Physician (Cardiology) Vertell Novak., MD as Consulting Physician (Gastroenterology) Thera Flake., MD as Consulting Physician (Orthopedic Surgery) Marlowe Aschoff, DPM as Consulting Physician (Podiatry)   Medication Review Current Outpatient Prescriptions on File Prior to Visit  Medication Sig Dispense Refill  . Acetaminophen (ARTHRITIS PAIN RELIEVER PO) Take by mouth as needed.      Marland Kitchen aspirin 81 MG tablet Take 81 mg by mouth daily.      . Cetirizine  HCl (ZYRTEC ALLERGY PO) Take 1 tablet by mouth daily.      . Cholecalciferol (VITAMIN D) 2000 UNITS CAPS Take 2 capsules by mouth daily.      Marland Kitchen FREESTYLE LITE test strip USE ONE STRIP TO CHECK GLUCOSE ONCE DAILY  100 each  0  . hydrochlorothiazide (HYDRODIURIL) 25 MG tablet Take 1 tablet (25 mg total) by mouth daily.  90 tablet  3  . HYDROcodone-acetaminophen (NORCO) 5-325 MG per tablet Take 1 tablet by mouth every 6 (six) hours as needed.      . Lancets (FREESTYLE) lancets USE LANCETS TO CHECK GLUCOSE EVERY DAY  100 each  1  . potassium chloride SA (K-DUR,KLOR-CON) 20 MEQ tablet Take 1 tablet (20 mEq total) by mouth daily.  90 tablet  3  . quinapril (ACCUPRIL) 20 MG tablet Take 1 tablet (20 mg total) by mouth 2 (two) times daily.  180 tablet  3  . verapamil (COVERA HS) 180 MG (CO) 24 hr tablet Take 1 tablet (180 mg total) by mouth at bedtime.  90 tablet  3   No current facility-administered medications on file prior to visit.   Allergies  Allergen Reactions  . Penicillins Anaphylaxis  . Calcium-Containing Compounds   . Celebrex [Celecoxib]   . Daypro [Oxaprozin]   . Feldene [Piroxicam]     Current Problems (verified) Patient Active Problem List   Diagnosis Date Noted  . Vitamin D Deficiency 09/28/2013  . Encounter for long-term (current) use of other medications  09/28/2013  . Hyperlipidemia   . HTN (hypertension)   . T2 NIDDM with CRI   . DJD (degenerative joint disease)   . Fatty liver   . 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 Palpitations 12/14/2011    Screening Tests Health Maintenance  Topic Date Due  . Ophthalmology Exam  06/02/1947  . Urine Microalbumin  06/02/1947  . Tetanus/tdap  06/01/1956  . Colonoscopy  06/02/1987  . Zostavax  06/01/1997  . Hemoglobin A1c  03/28/2014  . Influenza Vaccine  03/30/2014  . Foot Exam  12/06/2014  . Pneumococcal Polysaccharide Vaccine Age 72 And Over  Completed    Immunization History  Administered Date(s) Administered  .  Influenza Split 05/23/2013  . Pneumococcal Polysaccharide-23 07/29/2013    Preventative care: Last colonoscopy: 06/2009 due 2015 Last mammogram: 03/28/13 Last pap smear/pelvic exam: 11/30/12   DEXA:12/14/11 osteopenia CXR: 01/29/13 2D Echo: 12/22/11  Prior vaccinations: TD or Tdap: 2013 Influenza: 9/ 2014 Pneumococcal: 06/2013 Shingles/Zostavax: Declines due to cost   History reviewed:  Past Medical History  Diagnosis Date  . Vitamin D deficiency   . Nodule of chest wall   . Heel pain     left foot  . Hypercholesteremia   . HTN (hypertension)   . Type II or unspecified type diabetes mellitus without mention of complication, not stated as uncontrolled   . DJD (degenerative joint disease)   . Fatty liver    Past Surgical History  Procedure Laterality Date  . Tonsillectomy and adenoidectomy    . Total knee arthroplasty    . Breast surgery    . Cataract extraction    . Cardiac catheterization     History  Substance Use Topics  . Smoking status: Former Smoker    Quit date: 12/06/1963  . Smokeless tobacco: Not on file  . Alcohol Use: No   Family History  Problem Relation Age of Onset  . Hypertension    . Cancer    . Diabetes    . Heart disease       Risk Factors: Osteoporosis: postmenopausal estrogen deficiency History of fracture in the past year: no  Tobacco History  Substance Use Topics  . Smoking status: Former Smoker    Quit date: 12/06/1963  . Smokeless tobacco: Not on file  . Alcohol Use: No   She does not smoke.  Patient is a former smoker. Are there smokers in your home (other than you)?  No  Alcohol Current alcohol use: none  Caffeine Current caffeine use: tea .5 /day  Exercise Current exercise habits: Home exercise routine includes stretching and recumbant bike.  Current exercise: bicycling, housecleaning, walking and yard work  Nutrition/Diet Current diet: in general, a "healthy" diet    Cardiac risk factors: advanced age (older than  81 for men, 69 for women), diabetes mellitus, dyslipidemia and hypertension.  Depression Screen (Note: if answer to either of the following is "Yes", a more complete depression screening is indicated)   Q1: Over the past two weeks, have you felt down, depressed or hopeless? No  Q2: Over the past two weeks, have you felt little interest or pleasure in doing things? No  Have you lost interest or pleasure in daily life? No  Do you often feel hopeless? No  Do you cry easily over simple problems? No  Activities of Daily Living In your present state of health, do you have any difficulty performing the following activities?:  Driving? No Managing money?  No Feeding yourself? No Getting from bed to chair?  No Climbing a flight of stairs? No Preparing food and eating?: No Bathing or showering? No Getting dressed: No Getting to the toilet? No Using the toilet:No Moving around from place to place: No In the past year have you fallen or had a near fall?:No   Are you sexually active?  No  Do you have more than one partner?  No  Vision Difficulties: No  Hearing Difficulties: No Do you often ask people to speak up or repeat themselves? No Do you experience ringing or noises in your ears? No Do you have difficulty understanding soft or whispered voices? No  Cognition  Do you feel that you have a problem with memory?No  Do you often misplace items? No  Do you feel safe at home?  Yes  Advanced directives Does patient have a Health Care Power of Attorney? No Does patient have a Living Will? No    Objective:     Vision and hearing screens reviewed.   Blood pressure 124/69, pulse 52, temperature 98 F (36.7 C), temperature source Temporal, resp. rate 16, weight 194 lb (87.998 kg). Body mass index is 39.85 kg/(m^2).  General appearance: alert, no distress, WD/WN,  female Cognitive Testing  Alert? Yes  Normal Appearance?Yes  Oriented to person? Yes  Place? Yes   Time? Yes  Recall  of three objects?  Yes  Can perform simple calculations? Yes  Displays appropriate judgment?Yes  Can read the correct time from a watch face?Yes  HEENT: normocephalic, sclerae anicteric, TMs pearly, nares patent, no discharge or erythema, pharynx normal Oral cavity: MMM, no lesions Neck: supple, no lymphadenopathy, no thyromegaly, no masses Heart: RRR, normal S1, S2, no murmurs Lungs: CTA bilaterally, no wheezes, rhonchi, or rales Abdomen: +bs, soft, non tender, non distended, no masses, no hepatomegaly, no splenomegaly Musculoskeletal: nontender, no swelling, no obvious deformity Extremities: no edema, no cyanosis, no clubbing Pulses: 2+ symmetric, upper and lower extremities, normal cap refill SKIN: Both with dry scaling heels, calluses at great toes, thick nails Neurological: alert, oriented x 3, CN2-12 intact, strength normal upper extremities and lower extremities, sensation normal throughout except Left lateral decreased sensation, DTRs 2+ throughout, no cerebellar signs, gait normal Psychiatric: normal affect, behavior normal, pleasant  Breast: DEF Gyn: DEF Rectal:DEF   AORTA SCAN WNL EKG NSCSPT   Assessment:  1. Medicare wellness update- Update screening labs/ History/ Immunizations/ Testing as needed. Advised healthy diet, QD exercise, increase H20 and continue RX/ Vitamins AD.  2. 3 month F/U for Obesity,HTN, Cholesterol,DM, D. Deficient. Needs healthy diet, cardio QD and obtain healthy weight. Check Labs, Check BP if >130/80 call office, Check BS if >200 call office  3. Callus/  dry skin DM- Advised needs podiatry evaluation with  DM Hx, epsom salt soaks, vaseline treatment QD, monitor QD and call with any concerns/ adverse changes  4. Allergic rhinitis- Allegra OTC, increase H2o, allergy hygiene explained.  Plan:   Also see above  During the course of the visit the patient was educated and counseled about appropriate screening and preventive services including:     Screening mammography  Bone densitometry screening  Diabetes screening  Nutrition counseling   Advanced directives: has NO advanced directive - not interested in additional information  Screening recommendations, referrals: ALL FOLLOWING UP TO DATE OR DECLINES  Vaccinations: Tdap vaccine not indicated Influenza vaccine not indicated Pneumococcal vaccine not indicated Shingles vaccine declined Hep B vaccine not indicated  Nutrition assessed and recommended  Colonoscopy ordered Mammogram not indicated Pap smear not indicated  Pelvic exam not indicated Recommended yearly ophthalmology/optometry visit for glaucoma screening and checkup Recommended yearly dental visit for hygiene and checkup Advanced directives - declined  Conditions/risks identified: BMI: Discussed weight loss, diet, and increase physical activity.  Increase physical activity: AHA recommends 150 minutes of physical activity a week.  Medications reviewed DEXA- ordered Diabetes at goal, ACE/ARB therapy No, Reason not on Ace Inhibitor/ARB therapy:  NOt sure, will verify Urinary Incontinence is not an issue: discussed non pharmacology and pharmacology options.  Fall risk: low- discussed PT, home fall assessment, medications.   Medicare Attestation I have personally reviewed: The patient's medical and social history Their use of alcohol, tobacco or illicit drugs Their current medications and supplements The patient's functional ability including ADLs,fall risks, home safety risks, cognitive, and hearing and visual impairment Diet and physical activities Evidence for depression or mood disorders  The patient's weight, height, BMI, and visual acuity have been recorded in the chart.  I have made referrals, counseling, and provided education to the patient based on review of the above and I have provided the patient with a written personalized care plan for preventive services.     Berenice PrimasMelissa R Carsynn Bethune,  PA-C   12/10/2013    CPT [id number] first AWV CPT [phone] subsequent AWV

## 2013-12-06 ENCOUNTER — Ambulatory Visit (INDEPENDENT_AMBULATORY_CARE_PROVIDER_SITE_OTHER): Payer: Medicare Other | Admitting: Podiatrist

## 2013-12-06 DIAGNOSIS — M79609 Pain in unspecified limb: Secondary | ICD-10-CM

## 2013-12-06 DIAGNOSIS — B351 Tinea unguium: Secondary | ICD-10-CM

## 2013-12-06 DIAGNOSIS — M79676 Pain in unspecified toe(s): Principal | ICD-10-CM

## 2013-12-06 NOTE — Progress Notes (Signed)
HPI: Patient presents today for follow up of foot and nail care. Denies any new complaints today.  Objective: Patients chart is reviewed. Neurovascular status unchanged. Patients nails are thickened, discolored, distrophic, friable and brittle with yellow-brown discoloration. Patient subjectively relates they are painful with shoes and with ambulation.both feet  Assessment: Symptomatic onychomycosis  Plan: Discussed treatment options and alternatives. The symptomatic toenails were debrided through manual means only without complication. Return appointment recommended at routine intervals of 3 months  Note- patient requests no mechanical debridement- only nail trim  

## 2013-12-17 ENCOUNTER — Other Ambulatory Visit: Payer: Self-pay | Admitting: Physician Assistant

## 2013-12-27 ENCOUNTER — Ambulatory Visit (INDEPENDENT_AMBULATORY_CARE_PROVIDER_SITE_OTHER): Payer: Medicare Other | Admitting: *Deleted

## 2013-12-27 DIAGNOSIS — I10 Essential (primary) hypertension: Secondary | ICD-10-CM

## 2013-12-27 DIAGNOSIS — Z79899 Other long term (current) drug therapy: Secondary | ICD-10-CM

## 2013-12-27 DIAGNOSIS — R5381 Other malaise: Secondary | ICD-10-CM

## 2013-12-27 DIAGNOSIS — E119 Type 2 diabetes mellitus without complications: Secondary | ICD-10-CM

## 2013-12-27 DIAGNOSIS — E782 Mixed hyperlipidemia: Secondary | ICD-10-CM

## 2013-12-27 DIAGNOSIS — R5383 Other fatigue: Secondary | ICD-10-CM

## 2013-12-27 LAB — CBC WITH DIFFERENTIAL/PLATELET
BASOS ABS: 0.1 10*3/uL (ref 0.0–0.1)
BASOS PCT: 1 % (ref 0–1)
Eosinophils Absolute: 0.2 10*3/uL (ref 0.0–0.7)
Eosinophils Relative: 3 % (ref 0–5)
HCT: 37.6 % (ref 36.0–46.0)
Hemoglobin: 12.4 g/dL (ref 12.0–15.0)
Lymphocytes Relative: 43 % (ref 12–46)
Lymphs Abs: 2.8 10*3/uL (ref 0.7–4.0)
MCH: 28.2 pg (ref 26.0–34.0)
MCHC: 33 g/dL (ref 30.0–36.0)
MCV: 85.6 fL (ref 78.0–100.0)
MONO ABS: 0.7 10*3/uL (ref 0.1–1.0)
Monocytes Relative: 10 % (ref 3–12)
NEUTROS ABS: 2.8 10*3/uL (ref 1.7–7.7)
NEUTROS PCT: 43 % (ref 43–77)
PLATELETS: 249 10*3/uL (ref 150–400)
RBC: 4.39 MIL/uL (ref 3.87–5.11)
RDW: 14.9 % (ref 11.5–15.5)
WBC: 6.5 10*3/uL (ref 4.0–10.5)

## 2013-12-27 LAB — HEMOGLOBIN A1C
HEMOGLOBIN A1C: 6.8 % — AB (ref <5.7)
Mean Plasma Glucose: 148 mg/dL — ABNORMAL HIGH (ref <117)

## 2013-12-27 NOTE — Progress Notes (Signed)
Patient ID: Marvene StaffMary C Belgarde, female   DOB: 02/16/1935, 79 y.o.   MRN: [id number] Patient here for labs.  Patient was seen 12/05/13 and was too early to check labs at that visit for insurance to cover.

## 2013-12-28 LAB — BASIC METABOLIC PANEL WITH GFR
BUN: 18 mg/dL (ref 6–23)
CHLORIDE: 102 meq/L (ref 96–112)
CO2: 27 mEq/L (ref 19–32)
Calcium: 9.8 mg/dL (ref 8.4–10.5)
Creat: 0.87 mg/dL (ref 0.50–1.10)
GFR, EST AFRICAN AMERICAN: 75 mL/min
GFR, Est Non African American: 65 mL/min
Glucose, Bld: 66 mg/dL — ABNORMAL LOW (ref 70–99)
Potassium: 4.2 mEq/L (ref 3.5–5.3)
SODIUM: 140 meq/L (ref 135–145)

## 2013-12-28 LAB — MAGNESIUM: MAGNESIUM: 1.7 mg/dL (ref 1.5–2.5)

## 2013-12-28 LAB — HEPATIC FUNCTION PANEL
ALT: 11 U/L (ref 0–35)
AST: 16 U/L (ref 0–37)
Albumin: 4 g/dL (ref 3.5–5.2)
Alkaline Phosphatase: 46 U/L (ref 39–117)
BILIRUBIN DIRECT: 0.1 mg/dL (ref 0.0–0.3)
Indirect Bilirubin: 0.6 mg/dL (ref 0.2–1.2)
TOTAL PROTEIN: 6.5 g/dL (ref 6.0–8.3)
Total Bilirubin: 0.7 mg/dL (ref 0.2–1.2)

## 2013-12-28 LAB — URINALYSIS, ROUTINE W REFLEX MICROSCOPIC
Bilirubin Urine: NEGATIVE
Glucose, UA: NEGATIVE mg/dL
HGB URINE DIPSTICK: NEGATIVE
KETONES UR: NEGATIVE mg/dL
Leukocytes, UA: NEGATIVE
NITRITE: NEGATIVE
PH: 8 (ref 5.0–8.0)
Protein, ur: NEGATIVE mg/dL
Specific Gravity, Urine: 1.015 (ref 1.005–1.030)
Urobilinogen, UA: 0.2 mg/dL (ref 0.0–1.0)

## 2013-12-28 LAB — MICROALBUMIN / CREATININE URINE RATIO
Creatinine, Urine: 67.1 mg/dL
Microalb Creat Ratio: 21.8 mg/g (ref 0.0–30.0)
Microalb, Ur: 1.46 mg/dL (ref 0.00–1.89)

## 2013-12-28 LAB — LIPID PANEL
CHOL/HDL RATIO: 3 ratio
Cholesterol: 175 mg/dL (ref 0–200)
HDL: 58 mg/dL (ref >39)
LDL Cholesterol: 94 mg/dL (ref 0–99)
Triglycerides: 116 mg/dL (ref <150)
VLDL: 23 mg/dL (ref 0–40)

## 2013-12-28 LAB — INSULIN, FASTING: INSULIN FASTING, SERUM: 28 u[IU]/mL (ref 3–28)

## 2013-12-28 LAB — TSH: TSH: 1.997 u[IU]/mL (ref 0.350–4.500)

## 2014-03-06 ENCOUNTER — Other Ambulatory Visit: Payer: Self-pay | Admitting: Physician Assistant

## 2014-03-07 ENCOUNTER — Ambulatory Visit: Payer: Medicare Other | Admitting: Podiatrist

## 2014-03-14 ENCOUNTER — Ambulatory Visit (INDEPENDENT_AMBULATORY_CARE_PROVIDER_SITE_OTHER): Payer: Medicare Other | Admitting: Podiatrist

## 2014-03-14 DIAGNOSIS — B351 Tinea unguium: Secondary | ICD-10-CM

## 2014-03-14 DIAGNOSIS — M79609 Pain in unspecified limb: Secondary | ICD-10-CM

## 2014-03-14 DIAGNOSIS — M79676 Pain in unspecified toe(s): Principal | ICD-10-CM

## 2014-03-14 NOTE — Progress Notes (Signed)
HPI: Patient presents today for follow up of foot and nail care. Denies any new complaints today.  Objective: Patients chart is reviewed. Neurovascular status unchanged. Patients nails are thickened, discolored, distrophic, friable and brittle with yellow-brown discoloration. Patient subjectively relates they are painful with shoes and with ambulation.both feet  Assessment: Symptomatic onychomycosis  Plan: Discussed treatment options and alternatives. The symptomatic toenails were debrided through manual means only without complication. Return appointment recommended at routine intervals of 3 months  Note- patient requests no mechanical debridement- only nail trim  

## 2014-03-20 ENCOUNTER — Ambulatory Visit: Payer: Self-pay | Admitting: Internal Medicine

## 2014-03-25 ENCOUNTER — Other Ambulatory Visit: Payer: Self-pay | Admitting: *Deleted

## 2014-03-25 MED ORDER — GLUCOSE BLOOD VI STRP: ORAL_STRIP | Status: DC

## 2014-04-04 ENCOUNTER — Ambulatory Visit (INDEPENDENT_AMBULATORY_CARE_PROVIDER_SITE_OTHER): Payer: Medicare Other | Admitting: Internal Medicine

## 2014-04-04 ENCOUNTER — Encounter: Payer: Self-pay | Admitting: Internal Medicine

## 2014-04-04 VITALS — BP 134/78 | HR 52 | Temp 97.3°F | Resp 16 | Ht 58.5 in | Wt 192.6 lb

## 2014-04-04 DIAGNOSIS — E782 Mixed hyperlipidemia: Secondary | ICD-10-CM

## 2014-04-04 DIAGNOSIS — E119 Type 2 diabetes mellitus without complications: Secondary | ICD-10-CM

## 2014-04-04 DIAGNOSIS — I10 Essential (primary) hypertension: Secondary | ICD-10-CM

## 2014-04-04 DIAGNOSIS — Z79899 Other long term (current) drug therapy: Secondary | ICD-10-CM

## 2014-04-04 DIAGNOSIS — E559 Vitamin D deficiency, unspecified: Secondary | ICD-10-CM

## 2014-04-04 LAB — CBC WITH DIFFERENTIAL/PLATELET
BASOS ABS: 0.1 10*3/uL (ref 0.0–0.1)
Basophils Relative: 1 % (ref 0–1)
EOS PCT: 2 % (ref 0–5)
Eosinophils Absolute: 0.1 10*3/uL (ref 0.0–0.7)
HEMATOCRIT: 40.4 % (ref 36.0–46.0)
HEMOGLOBIN: 13.4 g/dL (ref 12.0–15.0)
LYMPHS PCT: 38 % (ref 12–46)
Lymphs Abs: 2.7 10*3/uL (ref 0.7–4.0)
MCH: 27.7 pg (ref 26.0–34.0)
MCHC: 33.2 g/dL (ref 30.0–36.0)
MCV: 83.6 fL (ref 78.0–100.0)
MONO ABS: 0.9 10*3/uL (ref 0.1–1.0)
MONOS PCT: 12 % (ref 3–12)
NEUTROS ABS: 3.3 10*3/uL (ref 1.7–7.7)
Neutrophils Relative %: 47 % (ref 43–77)
Platelets: 306 10*3/uL (ref 150–400)
RBC: 4.83 MIL/uL (ref 3.87–5.11)
RDW: 15.5 % (ref 11.5–15.5)
WBC: 7.1 10*3/uL (ref 4.0–10.5)

## 2014-04-04 LAB — HEMOGLOBIN A1C
HEMOGLOBIN A1C: 6.7 % — AB (ref <5.7)
MEAN PLASMA GLUCOSE: 146 mg/dL — AB (ref <117)

## 2014-04-04 MED ORDER — METFORMIN HCL ER 500 MG PO TB24: ORAL_TABLET | ORAL | Status: DC

## 2014-04-04 MED ORDER — METFORMIN HCL 500 MG PO TABS: 1000.0000 mg | ORAL_TABLET | Freq: Two times a day (BID) | ORAL | Status: DC

## 2014-04-04 NOTE — Progress Notes (Signed)
Patient ID: Tracey StaffMary C Sinopoli, female   DOB: 03/10/1935, 79 y.o.   MRN: [id number]   This very nice 79 y.o.female presents for 3 month follow up with Hypertension, Hyperlipidemia, Pre-Diabetes and Vitamin D Deficiency.    Patient is treated for HTN & BP has been controlled at home. Today's BP: 134/78 mmHg. Patient denies any cardiac type chest pain, palpitations, dyspnea/orthopnea/PND, dizziness, claudication, or dependent edema.   Hyperlipidemia is controlled with diet & meds. Patient denies myalgias or other med SE's. 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  Last A1c was 12/27/2013: Hemoglobin-A1c 6.8*    Further, Patient has history of Vitamin D Deficiency and patient supplements vitamin D without any suspected side-effects. Last vitamin D was 09/28/2013: Vit D, 25-Hydroxy 69  Medication List   ARTHRITIS PAIN RELIEVER PO  Take by mouth as needed.     aspirin 81 MG tablet  Take 81 mg by mouth daily.     freestyle lancets  USE LANCETS TO CHECK GLUCOSE EVERY DAY     FREESTYLE LITE test strip  Generic drug:  glucose blood  USE ONE STRIP TO CHECK GLUCOSE ONCE DAILY     glucose blood test strip  Commonly known as:  FREESTYLE LITE  - ONE STRIP TO CHECK GLUCOSE ONCE DAILY - DX 250.00     glimepiride 2 MG tablet  Commonly known as:  AMARYL  Take 1 tablet (2 mg total) by mouth daily before breakfast.     hydrochlorothiazide 25 MG tablet  Commonly known as:  HYDRODIURIL  Take 1 tablet (25 mg total) by mouth daily.     Magnesium 500 MG Tabs  Take 500 mg by mouth daily.     metFORMIN 500 MG tablet  Commonly known as:  GLUCOPHAGE  Take 1 tablet (500 mg total) by mouth 2 (two) times daily with a meal.     potassium chloride SA 20 MEQ tablet  Commonly  known as:  K-DUR,KLOR-CON  Take 1 tablet (20 mEq total) by mouth daily.     pravastatin 40 MG tablet  Commonly known as:  PRAVACHOL  TAKE ONE TABLET BY MOUTH ONCE DAILY AT BEDTIME FOR CHOLESTEROL     quinapril 20 MG tablet  Commonly known as:  ACCUPRIL  Take 1 tablet (20 mg total) by mouth 2 (two) times daily.     verapamil 180 MG (CO) 24 hr tablet  Commonly known as:  COVERA HS  Take 1 tablet (180 mg total) by mouth at bedtime.     Vitamin D 2000 UNITS Caps  Take 2 capsules by mouth daily.     ZYRTEC ALLERGY PO  Take 1 tablet by mouth daily.     Allergies  Allergen Reactions  . Penicillins Anaphylaxis  . Calcium-Containing Compounds   . Celebrex [Celecoxib]   . Daypro [Oxaprozin]   . Feldene [Piroxicam]    PMHx:   Past Medical History  Diagnosis Date  . Vitamin D deficiency   . Nodule of chest wall   . Heel pain     left foot  . Hypercholesteremia   . HTN (hypertension)   . Type II or unspecified type diabetes mellitus without mention of complication, not stated as uncontrolled   . DJD (degenerative joint disease)   . Fatty liver  FHx:    Reviewed / unchanged SHx:    Reviewed / unchanged  Systems Review:  Constitutional: Denies fever, chills, wt changes, headaches, insomnia, fatigue, night sweats, change in appetite. Eyes: Denies redness, blurred vision, diplopia, discharge, itchy, watery eyes.  ENT: Denies discharge, congestion, post nasal drip, epistaxis, sore throat, earache, hearing loss, dental pain, tinnitus, vertigo, sinus pain, snoring.  CV: Denies chest pain, palpitations, irregular heartbeat, syncope, dyspnea, diaphoresis, orthopnea, PND, claudication or edema. Respiratory: denies cough, dyspnea, DOE, pleurisy, hoarseness, laryngitis, wheezing.  Gastrointestinal: Denies dysphagia, odynophagia, heartburn, reflux, water brash, abdominal pain or cramps, nausea, vomiting, bloating, diarrhea, constipation, hematemesis, melena, hematochezia  or  hemorrhoids. Genitourinary: Denies dysuria, frequency, urgency, nocturia, hesitancy, discharge, hematuria or flank pain. Musculoskeletal: Denies arthralgias, myalgias, stiffness, jt. swelling, pain, limping or strain/sprain.  Skin: Denies pruritus, rash, hives, warts, acne, eczema or change in skin lesion(s). Neuro: No weakness, tremor, incoordination, spasms, paresthesia or pain. Psychiatric: Denies confusion, memory loss or sensory loss. Endo: Denies change in weight, skin or hair change.  Heme/Lymph: No excessive bleeding, bruising or enlarged lymph nodes.  Exam:  BP 134/78  Pulse 52  Temp(Src) 97.3 F (36.3 C) (Temporal)  Resp 16  Ht 4' 10.5" (1.486 m)  Wt 192 lb 9.6 oz (87.363 kg)  BMI 39.56 kg/m2  Appears well nourished and in no distress. Eyes: PERRLA, EOMs, conjunctiva no swelling or erythema. Sinuses: No frontal/maxillary tenderness ENT/Mouth: EAC's clear, TM's nl w/o erythema, bulging. Nares clear w/o erythema, swelling, exudates. Oropharynx clear without erythema or exudates. Oral hygiene is good. Tongue normal, non obstructing. Hearing intact.  Neck: Supple. Thyroid nl. Car 2+/2+ without bruits, nodes or JVD. Chest: Respirations nl with BS clear & equal w/o rales, rhonchi, wheezing or stridor.  Cor: Heart sounds normal w/ regular rate and rhythm without sig. murmurs, gallops, clicks, or rubs. Peripheral pulses normal and equal  without edema.  Abdomen: Soft & bowel sounds normal. Non-tender w/o guarding, rebound, hernias, masses, or organomegaly.  Lymphatics: Unremarkable.  Musculoskeletal: Full ROM all peripheral extremities, joint stability, 5/5 strength, and normal gait.  Skin: Warm, dry without exposed rashes, lesions or ecchymosis apparent.  Neuro: Cranial nerves intact, reflexes equal bilaterally. Sensory-motor testing grossly intact. Tendon reflexes grossly intact.  Pysch: Alert & oriented x 3. Insight and judgement nl & appropriate. No ideations.  Assessment and  Plan:  1. Hypertension - Continue monitor blood pressure at home. Continue diet/meds same.  2. Hyperlipidemia - Continue diet/meds, exercise,& lifestyle modifications. Continue monitor periodic cholesterol/liver & renal functions   3. T2_NIDDM - Continue diet, exercise, lifestyle modifications. Monitor appropriate labs. Patient was advised to d/c glimepiride and increase MF gradually to facilitate weight loss.  4. Vitamin D Deficiency - Continue supplementation.  5. Morbid Obesity (BMI 39.6)  Recommended regular exercise, BP monitoring, weight control, and discussed med and SE's. Recommended labs to assess and monitor clinical status. Further disposition pending results of labs.

## 2014-04-04 NOTE — Patient Instructions (Signed)

## 2014-04-05 LAB — LIPID PANEL
Cholesterol: 169 mg/dL (ref 0–200)
HDL: 53 mg/dL (ref >39)
LDL Cholesterol: 79 mg/dL (ref 0–99)
TRIGLYCERIDES: 184 mg/dL — AB (ref <150)
Total CHOL/HDL Ratio: 3.2 Ratio
VLDL: 37 mg/dL (ref 0–40)

## 2014-04-05 LAB — INSULIN, FASTING: INSULIN FASTING, SERUM: 68 u[IU]/mL — AB (ref 3–28)

## 2014-04-05 LAB — HEPATIC FUNCTION PANEL
ALK PHOS: 50 U/L (ref 39–117)
ALT: 13 U/L (ref 0–35)
AST: 19 U/L (ref 0–37)
Albumin: 4.3 g/dL (ref 3.5–5.2)
Bilirubin, Direct: 0.1 mg/dL (ref 0.0–0.3)
Indirect Bilirubin: 0.5 mg/dL (ref 0.2–1.2)
TOTAL PROTEIN: 6.8 g/dL (ref 6.0–8.3)
Total Bilirubin: 0.6 mg/dL (ref 0.2–1.2)

## 2014-04-05 LAB — BASIC METABOLIC PANEL WITH GFR
BUN: 20 mg/dL (ref 6–23)
CHLORIDE: 104 meq/L (ref 96–112)
CO2: 25 mEq/L (ref 19–32)
Calcium: 10 mg/dL (ref 8.4–10.5)
Creat: 1.01 mg/dL (ref 0.50–1.10)
GFR, Est African American: 62 mL/min
GFR, Est Non African American: 54 mL/min — ABNORMAL LOW
GLUCOSE: 116 mg/dL — AB (ref 70–99)
Potassium: 4.2 mEq/L (ref 3.5–5.3)
Sodium: 141 mEq/L (ref 135–145)

## 2014-04-05 LAB — MAGNESIUM: Magnesium: 1.9 mg/dL (ref 1.5–2.5)

## 2014-04-05 LAB — VITAMIN D 25 HYDROXY (VIT D DEFICIENCY, FRACTURES): Vit D, 25-Hydroxy: 69 ng/mL (ref 30–89)

## 2014-04-05 LAB — TSH: TSH: 2.786 u[IU]/mL (ref 0.350–4.500)

## 2014-05-07 ENCOUNTER — Other Ambulatory Visit: Payer: Self-pay

## 2014-05-07 DIAGNOSIS — Z1231 Encounter for screening mammogram for malignant neoplasm of breast: Secondary | ICD-10-CM

## 2014-05-14 ENCOUNTER — Other Ambulatory Visit: Payer: Self-pay | Admitting: *Deleted

## 2014-05-14 MED ORDER — VERAPAMIL HCL 180 MG (CO) PO TB24: 180.0000 mg | ORAL_TABLET | Freq: Every day | ORAL | Status: DC

## 2014-05-23 ENCOUNTER — Ambulatory Visit: Payer: Self-pay

## 2014-05-23 ENCOUNTER — Ambulatory Visit
Admission: RE | Admit: 2014-05-23 | Discharge: 2014-05-23 | Disposition: A | Discharge status: Home / Self Care | Payer: Medicare Other | Source: Ambulatory Visit

## 2014-05-23 ENCOUNTER — Encounter (INDEPENDENT_AMBULATORY_CARE_PROVIDER_SITE_OTHER): Payer: Self-pay

## 2014-05-23 DIAGNOSIS — Z1231 Encounter for screening mammogram for malignant neoplasm of breast: Secondary | ICD-10-CM

## 2014-05-30 ENCOUNTER — Ambulatory Visit (INDEPENDENT_AMBULATORY_CARE_PROVIDER_SITE_OTHER): Payer: Medicare Other | Admitting: *Deleted

## 2014-05-30 DIAGNOSIS — Z23 Encounter for immunization: Secondary | ICD-10-CM

## 2014-06-21 ENCOUNTER — Ambulatory Visit: Payer: Self-pay | Admitting: Emergency Medicine

## 2014-07-05 ENCOUNTER — Other Ambulatory Visit: Payer: Self-pay | Admitting: *Deleted

## 2014-07-05 MED ORDER — HYDROCHLOROTHIAZIDE 25 MG PO TABS: 25.0000 mg | ORAL_TABLET | Freq: Every day | ORAL | Status: DC

## 2014-07-05 MED ORDER — QUINAPRIL HCL 20 MG PO TABS: 20.0000 mg | ORAL_TABLET | Freq: Two times a day (BID) | ORAL | Status: DC

## 2014-07-05 MED ORDER — POTASSIUM CHLORIDE CRYS ER 20 MEQ PO TBCR: 20.0000 meq | EXTENDED_RELEASE_TABLET | Freq: Every day | ORAL | Status: DC

## 2014-07-08 ENCOUNTER — Ambulatory Visit: Payer: Self-pay | Admitting: Emergency Medicine

## 2014-07-08 ENCOUNTER — Ambulatory Visit (INDEPENDENT_AMBULATORY_CARE_PROVIDER_SITE_OTHER): Payer: Medicare Other | Admitting: Physician Assistant

## 2014-07-08 VITALS — BP 122/72 | HR 56 | Temp 97.7°F | Resp 16 | Ht 58.5 in | Wt 189.0 lb

## 2014-07-08 DIAGNOSIS — E1122 Type 2 diabetes mellitus with diabetic chronic kidney disease: Secondary | ICD-10-CM

## 2014-07-08 DIAGNOSIS — E782 Mixed hyperlipidemia: Secondary | ICD-10-CM

## 2014-07-08 DIAGNOSIS — K2100 Gastro-esophageal reflux disease with esophagitis, without bleeding: Secondary | ICD-10-CM

## 2014-07-08 DIAGNOSIS — B351 Tinea unguium: Secondary | ICD-10-CM

## 2014-07-08 DIAGNOSIS — Z79899 Other long term (current) drug therapy: Secondary | ICD-10-CM

## 2014-07-08 DIAGNOSIS — E559 Vitamin D deficiency, unspecified: Secondary | ICD-10-CM

## 2014-07-08 DIAGNOSIS — R197 Diarrhea, unspecified: Secondary | ICD-10-CM

## 2014-07-08 DIAGNOSIS — E669 Obesity, unspecified: Secondary | ICD-10-CM | POA: Insufficient documentation

## 2014-07-08 DIAGNOSIS — K760 Fatty (change of) liver, not elsewhere classified: Secondary | ICD-10-CM

## 2014-07-08 DIAGNOSIS — I10 Essential (primary) hypertension: Secondary | ICD-10-CM

## 2014-07-08 DIAGNOSIS — N182 Chronic kidney disease, stage 2 (mild): Secondary | ICD-10-CM

## 2014-07-08 LAB — BASIC METABOLIC PANEL WITH GFR
BUN: 17 mg/dL (ref 6–23)
CALCIUM: 9.3 mg/dL (ref 8.4–10.5)
CO2: 24 meq/L (ref 19–32)
Chloride: 102 mEq/L (ref 96–112)
Creat: 0.8 mg/dL (ref 0.50–1.10)
GFR, Est African American: 82 mL/min
GFR, Est Non African American: 71 mL/min
Glucose, Bld: 86 mg/dL (ref 70–99)
POTASSIUM: 4.4 meq/L (ref 3.5–5.3)
SODIUM: 138 meq/L (ref 135–145)

## 2014-07-08 LAB — CBC WITH DIFFERENTIAL/PLATELET
Basophils Absolute: 0.1 10*3/uL (ref 0.0–0.1)
Basophils Relative: 1 % (ref 0–1)
Eosinophils Absolute: 0.1 10*3/uL (ref 0.0–0.7)
Eosinophils Relative: 2 % (ref 0–5)
HCT: 38 % (ref 36.0–46.0)
Hemoglobin: 12.3 g/dL (ref 12.0–15.0)
LYMPHS ABS: 2.4 10*3/uL (ref 0.7–4.0)
LYMPHS PCT: 35 % (ref 12–46)
MCH: 27.6 pg (ref 26.0–34.0)
MCHC: 32.4 g/dL (ref 30.0–36.0)
MCV: 85.4 fL (ref 78.0–100.0)
Monocytes Absolute: 0.7 10*3/uL (ref 0.1–1.0)
Monocytes Relative: 10 % (ref 3–12)
NEUTROS ABS: 3.6 10*3/uL (ref 1.7–7.7)
NEUTROS PCT: 52 % (ref 43–77)
PLATELETS: 285 10*3/uL (ref 150–400)
RBC: 4.45 MIL/uL (ref 3.87–5.11)
RDW: 15.6 % — ABNORMAL HIGH (ref 11.5–15.5)
WBC: 6.9 10*3/uL (ref 4.0–10.5)

## 2014-07-08 LAB — LIPID PANEL
CHOL/HDL RATIO: 2.8 ratio
CHOLESTEROL: 150 mg/dL (ref 0–200)
HDL: 53 mg/dL (ref >39)
LDL Cholesterol: 73 mg/dL (ref 0–99)
Triglycerides: 122 mg/dL (ref <150)
VLDL: 24 mg/dL (ref 0–40)

## 2014-07-08 LAB — HEPATIC FUNCTION PANEL
ALT: 10 U/L (ref 0–35)
AST: 16 U/L (ref 0–37)
Albumin: 4.2 g/dL (ref 3.5–5.2)
Alkaline Phosphatase: 42 U/L (ref 39–117)
BILIRUBIN DIRECT: 0.1 mg/dL (ref 0.0–0.3)
BILIRUBIN TOTAL: 0.5 mg/dL (ref 0.2–1.2)
Indirect Bilirubin: 0.4 mg/dL (ref 0.2–1.2)
Total Protein: 6.4 g/dL (ref 6.0–8.3)

## 2014-07-08 LAB — MAGNESIUM: Magnesium: 1.7 mg/dL (ref 1.5–2.5)

## 2014-07-08 LAB — TSH: TSH: 1.88 u[IU]/mL (ref 0.350–4.500)

## 2014-07-08 LAB — HEMOGLOBIN A1C
Hgb A1c MFr Bld: 7.1 % — ABNORMAL HIGH (ref <5.7)
Mean Plasma Glucose: 157 mg/dL — ABNORMAL HIGH (ref <117)

## 2014-07-08 MED ORDER — VERAPAMIL HCL 180 MG (CO) PO TB24: 180.0000 mg | ORAL_TABLET | Freq: Every day | ORAL | Status: DC

## 2014-07-08 MED ORDER — TERBINAFINE HCL 250 MG PO TABS: 250.0000 mg | ORAL_TABLET | Freq: Every day | ORAL | Status: DC

## 2014-07-08 NOTE — Patient Instructions (Signed)
We are starting you on Metformin to prevent or treat diabetes. Metformin does not cause low blood sugars. In order to create energy your cells need insulin and sugar but sometime your cells do not accept the insulin and this can cause increased sugars and decreased energy. The Metformin helps your cells accept insulin and the sugar to give you more energy.   The two most common side effects are nausea and diarrhea, follow these rules to avoid it! You can take imodium per box instructions when starting metformin if needed.   Rules of metformin: 1) start out slow with only one pill daily. Our goal for you is 4 pills a day or 2000mg  total.  2) take with your largest meal. 3) Take with least amount of carbs.   Call if you have any problems.    Call Dr. Randa EvensEdwards and check on when need colonoscopy Can add Zantac at night     Bad carbs also include fruit juice, alcohol, and sweet tea. These are empty calories that do not signal to your brain that you are full.   Please remember the good carbs are still carbs which convert into sugar. So please measure them out no more than 1/2-1 cup of rice, oatmeal, pasta, and beans  Veggies are however free foods! Pile them on.   Not all fruit is created equal. Please see the list below, the fruit at the bottom is higher in sugars than the fruit at the top. Please avoid all dried fruits.     Terbinafine tablets What is this medicine? TERBINAFINE (TER bin a feen) is an antifungal medicine. It is used to treat certain kinds of fungal or yeast infections. This medicine may be used for other purposes; ask your health care provider or pharmacist if you have questions. COMMON BRAND NAME(S): Lamisil, Terbinex What should I tell my health care provider before I take this medicine? They need to know if you have any of these conditions: -drink alcoholic beverages -kidney disease -liver disease -an unusual or allergic reaction to terbinafine, other medicines,  foods, dyes, or preservatives -pregnant or trying to get pregnant -breast-feeding How should I use this medicine? Take this medicine by mouth with a full glass of water. Follow the directions on the prescription label. You can take this medicine with food or on an empty stomach. Take your medicine at regular intervals. Do not take your medicine more often than directed. Do not skip doses or stop your medicine early even if you feel better. Do not stop taking except on your doctor's advice. Talk to your pediatrician regarding the use of this medicine in children. Special care may be needed. Overdosage: If you think you have taken too much of this medicine contact a poison control center or emergency room at once. NOTE: This medicine is only for you. Do not share this medicine with others. What if I miss a dose? If you miss a dose, take it as soon as you can. If it is almost time for your next dose, take only that dose. Do not take double or extra doses. What may interact with this medicine? Do not take this medicine with any of the following medications: -thioridazine This medicine may also interact with the following medications: -beta-blockers -caffeine -cimetidine -cyclosporine -medicines for depression, anxiety, or psychotic disturbances -medicines for fungal infections like fluconazole and ketoconazole -medicines for irregular heartbeat like amiodarone, flecainide and propafenone -rifampin -warfarin This list may not describe all possible interactions. Give your health care provider  a list of all the medicines, herbs, non-prescription drugs, or dietary supplements you use. Also tell them if you smoke, drink alcohol, or use illegal drugs. Some items may interact with your medicine. What should I watch for while using this medicine? Visit your doctor or health care provider regularly. Tell your doctor right away if you have nausea or vomiting, loss of appetite, stomach pain on your right  upper side, yellow skin, dark urine, light stools, or are over tired. Some fungal infections need many weeks or months of treatment to cure. If you are taking this medicine for a long time, you will need to have important blood work done. What side effects may I notice from receiving this medicine? Side effects that you should report to your doctor or health care professional as soon as possible: -allergic reactions like skin rash or hives, swelling of the face, lips, or tongue -changes in vision -dark urine -fever or infection -general ill feeling or flu-like symptoms -light-colored stools -loss of appetite, nausea -redness, blistering, peeling or loosening of the skin, including inside the mouth -right upper belly pain -unusually weak or tired -yellowing of the eyes or skin Side effects that usually do not require medical attention (report to your doctor or health care professional if they continue or are bothersome): -changes in taste -diarrhea -hair loss -muscle or joint pain -stomach gas -stomach upset This list may not describe all possible side effects. Call your doctor for medical advice about side effects. You may report side effects to FDA at 1-800-FDA-1088. Where should I keep my medicine? Keep out of the reach of children. Store at room temperature below 25 degrees C (77 degrees F). Protect from light. Throw away any unused medicine after the expiration date. NOTE: This sheet is a summary. It may not cover all possible information. If you have questions about this medicine, talk to your doctor, pharmacist, or health care provider.  2015, [employer]. (2007-10-27 16:28:07)

## 2014-07-08 NOTE — Progress Notes (Signed)
Assessment and Plan:  Hypertension: Continue medication, monitor blood pressure at home. Continue DASH diet.  Reminder to go to the ER if any CP, SOB, nausea, dizziness, severe HA, changes vision/speech, left arm numbness and tingling and jaw pain. Cholesterol: Continue diet and exercise. Check cholesterol.  Diabetes-Continue diet and exercise. Check A1C Vitamin D Def- check level and continue medications.  Obesity with co morbidities- long discussion about weight loss, diet, and exercise Diarrhea- follow up with Dr. Randa EvensEdwards, take MF WITH food, and take magnesium with food.  Onychomycosis- has tried OTC meds, willing to try Lamisil, side effects discussed, will follow up in 6 weeks for LFTs, cheapest at [employer] Shortness of breath- atypical, normal stress test- suggest follow up with Dr. Randa EvensEdwards, can do zantac at night  Continue diet and meds as discussed. Further disposition pending results of labs. Discussed med's effects and SE's.  OVER 40 minutes of exam, counseling, chart review, referral performed  HPI 79 y.o. female  presents for 3 month follow up with hypertension, hyperlipidemia, diabetes and vitamin D. Her blood pressure has been controlled at home, today their BP is BP: 122/72 mmHg She does not workout. She denies chest pain, shortness of breath, dizziness.  She is on cholesterol medication and denies myalgias. Her cholesterol is at goal. The cholesterol was:  04/04/2014: Cholesterol, Total 169; HDL Cholesterol by NMR 53; LDL (calc) 79; Triglycerides 184* She has been working on diet and exercise for Diabetes with CKD stage II, she is on bASA, she is on ACE, Metformin she has been having increasing diarrhea with increase in metformin and off the glmeperide, and denies paresthesia of the feet, polydipsia, polyuria and visual disturbances. Last A1C  was: 04/04/2014: Hemoglobin-A1c 6.7* Patient is on Vitamin D supplement. 04/04/2014: Vit D, 25-Hydroxy 69   She has had  diarrhea intermittent since last sept, last colonoscopy 2010 with Dr. Randa EvensEdwards and she is due this year according to our records, she states she had one last year. We will get her to call Dr. Randa EvensEdwards and confirm. She states it is worse with food, she has some epigastric pain but it is better with prilosec. She denies other Ab pain, blood in stool, black stool, fever, chills, weight loss.   She also complains of shortness of breath and some right shoulder blade pain, exertional and non exertional, intermittent, no radiation, no dizziness, CP, nausea. Has normal myoview stress test 2013 and echo 2013. Worse with certain foods possibly too.   She complains of thickened toenails and wonders about available treatments. Exam reveals typical thickened discolored fungal infected toenails  left. This is onychomycosis of the toenails. We had a discussion about this diagnosis and went over the various aspects to consider. Any treatment is optional. Itraconazole (Sporanox) and terbinafine (Lamisil) are effective, expensive, may have liver toxicity, and are sometimes not covered by insurance for this indication. Discussed griseofulvin also which is inexpensive but much less effective and must be taken for up to 18 months with a very high recurrence rate, and alternative of no treatment for this benign but unsightly condition. After discussion the patient has decided to accept an Rx for Lamisil and full explanation of potential serious side effects of this drug is given.  Current Medications:  Current Outpatient Prescriptions on File Prior to Visit  Medication Sig Dispense Refill  . Acetaminophen (ARTHRITIS PAIN RELIEVER PO) Take by mouth as needed.    Marland Kitchen. aspirin 81 MG tablet Take 81 mg by mouth daily.    .Marland Kitchen  Cetirizine HCl (ZYRTEC ALLERGY PO) Take 1 tablet by mouth daily.    . Cholecalciferol (VITAMIN D) 2000 UNITS CAPS Take 2 capsules by mouth daily.    Marland Kitchen. FREESTYLE LITE test strip USE ONE STRIP TO CHECK GLUCOSE ONCE  DAILY 100 each PRN  . glucose blood (FREESTYLE LITE) test strip ONE STRIP TO CHECK GLUCOSE ONCE DAILY DX 250.00 100 each 0  . hydrochlorothiazide (HYDRODIURIL) 25 MG tablet Take 1 tablet (25 mg total) by mouth daily. 90 tablet 4  . Lancets (FREESTYLE) lancets USE LANCETS TO CHECK GLUCOSE EVERY DAY 100 each 1  . Magnesium 500 MG TABS Take 500 mg by mouth daily.    . metFORMIN (GLUCOPHAGE XR) 500 MG 24 hr tablet Take 2 tablets twice daily with meals for Diabetes 360 tablet 99  . potassium chloride SA (K-DUR,KLOR-CON) 20 MEQ tablet Take 1 tablet (20 mEq total) by mouth daily. 90 tablet 4  . pravastatin (PRAVACHOL) 40 MG tablet TAKE ONE TABLET BY MOUTH ONCE DAILY AT BEDTIME FOR CHOLESTEROL 90 tablet 1  . quinapril (ACCUPRIL) 20 MG tablet Take 1 tablet (20 mg total) by mouth 2 (two) times daily. 180 tablet 3  . verapamil (COVERA HS) 180 MG (CO) 24 hr tablet Take 1 tablet (180 mg total) by mouth at bedtime. 90 tablet 3   No current facility-administered medications on file prior to visit.   Medical History:  Past Medical History  Diagnosis Date  . Vitamin D deficiency   . Nodule of chest wall   . Heel pain     left foot  . Hypercholesteremia   . HTN (hypertension)   . Type II or unspecified type diabetes mellitus without mention of complication, not stated as uncontrolled   . DJD (degenerative joint disease)   . Fatty liver    Allergies:  Allergies  Allergen Reactions  . Penicillins Anaphylaxis  . Calcium-Containing Compounds   . Celebrex [Celecoxib]   . Daypro [Oxaprozin]   . Feldene [Piroxicam]      Review of Systems: [X]  = complains of  [ ]  = denies  General: Fatigue [ ]  Fever [ ]  Chills [ ]  Weakness [ ]   Insomnia [ ]  Eyes: Redness [ ]  Blurred vision [ ]  Diplopia [ ]   ENT: Congestion [ ]  Sinus Pain [ ]  Post Nasal Drip [ ]  Sore Throat [ ]  Earache [ ]   Cardiac: Chest pain/pressure [ ]  SOB [ ]  Orthopnea [ ]   Palpitations [ ]   Paroxysmal nocturnal dyspnea[ ]  Claudication [ ]  Edema  [ ]   Pulmonary: Cough [ ]  Wheezing[ ]   SOB [x ]  Snoring [ ]   GI: Nausea [ ]  Vomiting[ ]  Dysphagia[ ]  Heartburn[x]  Abdominal pain [x ] Constipation [ ] ; Diarrhea [x ]; BRBPR [ ]  Melena[ ]  GU: Hematuria[ ]  Dysuria [ ]  Nocturia[ ]  Urgency [ ]   Hesitancy [ ]  Discharge [ ]  Neuro: Headaches[ ]  Vertigo[ ]  Paresthesias[ ]  Spasm [ ]  Speech changes [ ]  Incoordination [ ]   Ortho: Arthritis [ ]  Joint pain [ ]  Muscle pain [ ]  Joint swelling [ ]  Back Pain [ ]  Skin:  Rash [ ]   Pruritis [ ]  Change in skin lesion [ ]   Psych: Depression[ ]  Anxiety[ ]  Confusion [ ]  Memory loss [ ]   Heme/Lypmh: Bleeding [ ]  Bruising [ ]  Enlarged lymph nodes [ ]   Endocrine: Visual blurring [ ]  Paresthesia [ ]  Polyuria [ ]  Polydypsea [ ]    Heat/cold intolerance [ ]  Hypoglycemia [ ]   Family history- Review  and unchanged Social history- Review and unchanged Physical Exam: BP 122/72 mmHg  Pulse 56  Temp(Src) 97.7 F (36.5 C)  Resp 16 Wt Readings from Last 3 Encounters:  04/04/14 192 lb 9.6 oz (87.363 kg)  12/05/13 194 lb (87.998 kg)  09/28/13 195 lb 3.2 oz (88.542 kg)   General Appearance: Well nourished, in no apparent distress. Eyes: PERRLA, EOMs, conjunctiva no swelling or erythema Sinuses: No Frontal/maxillary tenderness ENT/Mouth: Ext aud canals clear, TMs without erythema, bulging. No erythema, swelling, or exudate on post pharynx.  Tonsils not swollen or erythematous. Hearing normal.  Neck: Supple, thyroid normal.  Respiratory: Respiratory effort normal, BS equal bilaterally without rales, rhonchi, wheezing or stridor.  Cardio: RRR with no MRGs. Brisk peripheral pulses without edema.  Abdomen: Soft, + BS.  Non tender, no guarding, rebound, hernias, masses. Lymphatics: Non tender without lymphadenopathy.  Musculoskeletal: Full ROM, 5/5 strength, normal gait.  Skin: Warm, dry without rashes, lesions, ecchymosis, + thick brittle nail on left 1st toenail.  Neuro: Cranial nerves intact. No cerebellar symptoms.  Sensation intact.  Psych: Awake and oriented X 3, normal affect, Insight and Judgment appropriate.    Quentin Mulling, PA-C 10:18 AM [facility]

## 2014-07-09 LAB — VITAMIN D 25 HYDROXY (VIT D DEFICIENCY, FRACTURES): Vit D, 25-Hydroxy: 67 ng/mL (ref 30–89)

## 2014-07-16 ENCOUNTER — Other Ambulatory Visit: Payer: Self-pay | Admitting: Internal Medicine

## 2014-07-16 MED ORDER — VERAPAMIL HCL ER 240 MG PO TBCR: 240.0000 mg | EXTENDED_RELEASE_TABLET | Freq: Every day | ORAL | Status: DC

## 2014-07-18 ENCOUNTER — Ambulatory Visit: Payer: Medicare Other | Admitting: Podiatrist

## 2014-07-24 ENCOUNTER — Ambulatory Visit (INDEPENDENT_AMBULATORY_CARE_PROVIDER_SITE_OTHER): Payer: Medicare Other | Admitting: Podiatrist

## 2014-07-24 DIAGNOSIS — M79676 Pain in unspecified toe(s): Secondary | ICD-10-CM

## 2014-07-24 DIAGNOSIS — B351 Tinea unguium: Secondary | ICD-10-CM

## 2014-07-24 NOTE — Progress Notes (Signed)
HPI: Patient presents today for follow up of foot and nail care. Denies any new complaints today.  Objective: Patients chart is reviewed. Neurovascular status unchanged. Patients nails are thickened, discolored, distrophic, friable and brittle with yellow-brown discoloration. Patient subjectively relates they are painful with shoes and with ambulation.both feet  Assessment: Symptomatic onychomycosis  Plan: Discussed treatment options and alternatives. The symptomatic toenails were debrided through manual means only without complication. Return appointment recommended at routine intervals of 3 months  Note- patient requests no mechanical debridement- only nail trim

## 2014-07-24 NOTE — Patient Instructions (Signed)
Diabetes and Foot Care Diabetes may cause you to have problems because of poor blood supply (circulation) to your feet and legs. This may cause the skin on your feet to become thinner, break easier, and heal more slowly. Your skin may become dry, and the skin may peel and crack. You may also have nerve damage in your legs and feet causing decreased feeling in them. You may not notice minor injuries to your feet that could lead to infections or more serious problems. Taking care of your feet is one of the most important things you can do for yourself.  HOME CARE INSTRUCTIONS  Wear shoes at all times, even in the house. Do not go barefoot. Bare feet are easily injured.  Check your feet daily for blisters, cuts, and redness. If you cannot see the bottom of your feet, use a mirror or ask someone for help.  Wash your feet with warm water (do not use hot water) and mild soap. Then pat your feet and the areas between your toes until they are completely dry. Do not soak your feet as this can dry your skin.  Apply a moisturizing lotion or petroleum jelly (that does not contain alcohol and is unscented) to the skin on your feet and to dry, brittle toenails. Do not apply lotion between your toes.  Trim your toenails straight across. Do not dig under them or around the cuticle. File the edges of your nails with an emery board or nail file.  Do not cut corns or calluses or try to remove them with medicine.  Wear clean socks or stockings every day. Make sure they are not too tight. Do not wear knee-high stockings since they may decrease blood flow to your legs.  Wear shoes that fit properly and have enough cushioning. To break in new shoes, wear them for just a few hours a day. This prevents you from injuring your feet. Always look in your shoes before you put them on to be sure there are no objects inside.  Do not cross your legs. This may decrease the blood flow to your feet.  If you find a minor scrape,  cut, or break in the skin on your feet, keep it and the skin around it clean and dry. These areas may be cleansed with mild soap and water. Do not cleanse the area with peroxide, alcohol, or iodine.  When you remove an adhesive bandage, be sure not to damage the skin around it.  If you have a wound, look at it several times a day to make sure it is healing.  Do not use heating pads or hot water bottles. They may burn your skin. If you have lost feeling in your feet or legs, you may not know it is happening until it is too late.  Make sure your health care provider performs a complete foot exam at least annually or more often if you have foot problems. Report any cuts, sores, or bruises to your health care provider immediately. SEEK MEDICAL CARE IF:   You have an injury that is not healing.  You have cuts or breaks in the skin.  You have an ingrown nail.  You notice redness on your legs or feet.  You feel burning or tingling in your legs or feet.  You have pain or cramps in your legs and feet.  Your legs or feet are numb.  Your feet always feel cold. SEEK IMMEDIATE MEDICAL CARE IF:   There is increasing redness,   swelling, or pain in or around a wound.  There is a red line that goes up your leg.  Pus is coming from a wound.  You develop a fever or as directed by your health care provider.  You notice a bad smell coming from an ulcer or wound. Document Released: 08/13/2000 Document Revised: 04/18/2013 Document Reviewed: 01/23/2013 Exit[employer] Patient Information 2015 [employer]. This information is not intended to replace advice given to you by your health care provider. Make sure you discuss any questions you have with your health care provider.  

## 2014-08-05 ENCOUNTER — Other Ambulatory Visit: Payer: Self-pay | Admitting: Physician Assistant

## 2014-08-05 MED ORDER — GLUCOSE BLOOD VI STRP: ORAL_STRIP | Status: DC

## 2014-08-19 ENCOUNTER — Ambulatory Visit (INDEPENDENT_AMBULATORY_CARE_PROVIDER_SITE_OTHER): Payer: Medicare Other | Admitting: *Deleted

## 2014-08-19 DIAGNOSIS — Z79899 Other long term (current) drug therapy: Secondary | ICD-10-CM

## 2014-08-19 LAB — HEPATIC FUNCTION PANEL
ALBUMIN: 3.9 g/dL (ref 3.5–5.2)
ALT: <8 U/L (ref 0–35)
AST: 13 U/L (ref 0–37)
Alkaline Phosphatase: 43 U/L (ref 39–117)
BILIRUBIN DIRECT: 0.1 mg/dL (ref 0.0–0.3)
BILIRUBIN TOTAL: 0.4 mg/dL (ref 0.2–1.2)
Indirect Bilirubin: 0.3 mg/dL (ref 0.2–1.2)
Total Protein: 6.1 g/dL (ref 6.0–8.3)

## 2014-08-19 MED ORDER — PRAVASTATIN SODIUM 40 MG PO TABS: ORAL_TABLET | ORAL | Status: DC

## 2014-08-19 NOTE — Progress Notes (Signed)
Patient ID: Tracey Todd, female   DOB: 04/23/1935, 79 y.o.   MRN: [id number] Patient presents for 6 week recheck HFP with starting Lamisil rx.

## 2014-08-19 NOTE — Addendum Note (Signed)
Addended by: Jillian Pianka A on: 08/19/2014 09:43 AM   Modules accepted: Orders

## 2014-09-02 ENCOUNTER — Other Ambulatory Visit: Payer: Self-pay | Admitting: Gastroenterology

## 2014-09-23 ENCOUNTER — Ambulatory Visit: Payer: Self-pay | Admitting: Internal Medicine

## 2014-10-08 ENCOUNTER — Ambulatory Visit (INDEPENDENT_AMBULATORY_CARE_PROVIDER_SITE_OTHER): Payer: Medicare Other | Admitting: Internal Medicine

## 2014-10-08 ENCOUNTER — Encounter: Payer: Self-pay | Admitting: Internal Medicine

## 2014-10-08 VITALS — BP 124/72 | HR 60 | Temp 97.6°F | Resp 16 | Ht 58.5 in | Wt 189.6 lb

## 2014-10-08 DIAGNOSIS — E1121 Type 2 diabetes mellitus with diabetic nephropathy: Secondary | ICD-10-CM | POA: Insufficient documentation

## 2014-10-08 DIAGNOSIS — E782 Mixed hyperlipidemia: Secondary | ICD-10-CM

## 2014-10-08 DIAGNOSIS — B351 Tinea unguium: Secondary | ICD-10-CM

## 2014-10-08 DIAGNOSIS — Z79899 Other long term (current) drug therapy: Secondary | ICD-10-CM

## 2014-10-08 DIAGNOSIS — I10 Essential (primary) hypertension: Secondary | ICD-10-CM

## 2014-10-08 DIAGNOSIS — E559 Vitamin D deficiency, unspecified: Secondary | ICD-10-CM

## 2014-10-08 LAB — HEPATIC FUNCTION PANEL
ALBUMIN: 4.1 g/dL (ref 3.5–5.2)
ALT: 8 U/L (ref 0–35)
AST: 14 U/L (ref 0–37)
Alkaline Phosphatase: 44 U/L (ref 39–117)
BILIRUBIN TOTAL: 0.5 mg/dL (ref 0.2–1.2)
Bilirubin, Direct: 0.1 mg/dL (ref 0.0–0.3)
Indirect Bilirubin: 0.4 mg/dL (ref 0.2–1.2)
Total Protein: 6.8 g/dL (ref 6.0–8.3)

## 2014-10-08 LAB — LIPID PANEL
CHOL/HDL RATIO: 2.8 ratio
Cholesterol: 163 mg/dL (ref 0–200)
HDL: 58 mg/dL (ref >39)
LDL Cholesterol: 84 mg/dL (ref 0–99)
Triglycerides: 104 mg/dL (ref <150)
VLDL: 21 mg/dL (ref 0–40)

## 2014-10-08 LAB — CBC WITH DIFFERENTIAL/PLATELET
BASOS ABS: 0.1 10*3/uL (ref 0.0–0.1)
Basophils Relative: 1 % (ref 0–1)
EOS ABS: 0.1 10*3/uL (ref 0.0–0.7)
EOS PCT: 2 % (ref 0–5)
HCT: 37.8 % (ref 36.0–46.0)
Hemoglobin: 12.6 g/dL (ref 12.0–15.0)
LYMPHS ABS: 2 10*3/uL (ref 0.7–4.0)
LYMPHS PCT: 30 % (ref 12–46)
MCH: 28.8 pg (ref 26.0–34.0)
MCHC: 33.3 g/dL (ref 30.0–36.0)
MCV: 86.5 fL (ref 78.0–100.0)
MPV: 9.7 fL (ref 8.6–12.4)
Monocytes Absolute: 1 10*3/uL (ref 0.1–1.0)
Monocytes Relative: 16 % — ABNORMAL HIGH (ref 3–12)
NEUTROS PCT: 51 % (ref 43–77)
Neutro Abs: 3.3 10*3/uL (ref 1.7–7.7)
PLATELETS: 292 10*3/uL (ref 150–400)
RBC: 4.37 MIL/uL (ref 3.87–5.11)
RDW: 15.1 % (ref 11.5–15.5)
WBC: 6.5 10*3/uL (ref 4.0–10.5)

## 2014-10-08 LAB — BASIC METABOLIC PANEL WITH GFR
BUN: 14 mg/dL (ref 6–23)
CO2: 29 meq/L (ref 19–32)
CREATININE: 0.78 mg/dL (ref 0.50–1.10)
Calcium: 9.8 mg/dL (ref 8.4–10.5)
Chloride: 101 mEq/L (ref 96–112)
GFR, Est African American: 85 mL/min
GFR, Est Non African American: 74 mL/min
Glucose, Bld: 88 mg/dL (ref 70–99)
Potassium: 4.6 mEq/L (ref 3.5–5.3)
Sodium: 138 mEq/L (ref 135–145)

## 2014-10-08 LAB — TSH: TSH: 2.27 u[IU]/mL (ref 0.350–4.500)

## 2014-10-08 LAB — MAGNESIUM: MAGNESIUM: 1.7 mg/dL (ref 1.5–2.5)

## 2014-10-08 LAB — HEMOGLOBIN A1C
HEMOGLOBIN A1C: 6.9 % — AB (ref <5.7)
MEAN PLASMA GLUCOSE: 151 mg/dL — AB (ref <117)

## 2014-10-08 MED ORDER — TERBINAFINE HCL 250 MG PO TABS: 250.0000 mg | ORAL_TABLET | Freq: Every day | ORAL | Status: DC

## 2014-10-08 MED ORDER — BISOPROLOL-HYDROCHLOROTHIAZIDE 5-6.25 MG PO TABS: 1.0000 | ORAL_TABLET | Freq: Every day | ORAL | Status: DC

## 2014-10-08 NOTE — Progress Notes (Signed)
Patient ID: Tracey Todd, female   DOB: June 20, 1935, 80 y.o.   MRN: [id number]   This very nice 80 y.o.female presents for 3 month follow up with Hypertension, Hyperlipidemia, Pre-Diabetes and Vitamin D Deficiency.    Patient is treated for HTN (1995) & BP has been controlled at home. Today's BP: 124/72 mmHg. Patient has had no complaints of any cardiac type chest pain, palpitations, dyspnea/orthopnea/PND, dizziness, claudication, or dependent edema. She does c/o increased cost of her co-pay for her Rx Verapamil and desires something less expensive.   Hyperlipidemia is controlled with diet & meds. Patient denies myalgias or other med SE's. Last Lipids were at goal - Total Chol150; HDL 53; LDL 73; Trig 122 on 07/08/2014.   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  Last A1c was  7.1% on 07/08/2014.   Further, the patient also has history of Vitamin D Deficiency and supplements vitamin D without any suspected side-effects. Last vitamin D was 67 on  07/08/2014.  Medication Sig  . Acetaminophen (ARTHRITIS PAIN RELIEVER PO) Take by mouth as needed.  Marland Kitchen aspirin 81 MG tablet Take 81 mg by mouth daily.  . Cetirizine HCl (ZYRTEC ALLERGY PO) Take 1 tablet by mouth daily.  . Cholecalciferol (VITAMIN D) 2000 UNITS CAPS Take 2 capsules by mouth daily.  Marland Kitchen FREESTYLE LITE test strip USE ONE STRIP TO CHECK GLUCOSE ONCE DAILY  . glucose blood (FREESTYLE LITE) test strip ONE STRIP TO CHECK GLUCOSE ONCE DAILY DX E10.9  . hydrochlorothiazide (HYDRODIURIL) 25 MG tablet Take 1 tablet (25 mg total) by mouth daily.  . Lancets (FREESTYLE) lancets USE ONE LANCET TO CHECK GLUCOSE EVERY DAY  . Magnesium 500 MG TABS Take 500 mg by mouth daily.  . metFORMIN (GLUCOPHAGE XR) 500 MG 24 hr tablet Take 2 tablets twice daily with meals for Diabetes  . omeprazole (PRILOSEC) 20 MG capsule Take 20 mg by mouth daily.  .  potassium chloride SA (K-DUR,KLOR-CON) 20 MEQ tablet Take 1 tablet (20 mEq total) by mouth daily.  . pravastatin (PRAVACHOL) 40 MG tablet TAKE ONE TABLET BY MOUTH ONCE DAILY AT BEDTIME FOR CHOLESTEROL  . quinapril (ACCUPRIL) 20 MG tablet Take 1 tablet (20 mg total) by mouth 2 (two) times daily.  Marland Kitchen terbinafine (LAMISIL) 250 MG tablet Take 1 tablet (250 mg total) by mouth daily.  . verapamil (CALAN-SR) 240 MG CR tablet Take 1 tablet (240 mg total) by mouth daily.  Marland Kitchen glucose blood (FREESTYLE LITE) test strip ONE STRIP TO CHECK GLUCOSE ONCE DAILY DX 250.00  . hydrochlorothiazide (HYDRODIURIL) 25 MG tablet Take 1 tablet (25 mg total) by mouth daily.  . potassium chloride SA (K-DUR,KLOR-CON) 20 MEQ tablet Take 1 tablet (20 mEq total) by mouth daily.  . pravastatin (PRAVACHOL) 40 MG tablet TAKE ONE TABLET BY MOUTH ONCE DAILY AT BEDTIME FOR CHOLESTEROL  . quinapril (ACCUPRIL) 20 MG tablet Take 1 tablet (20 mg total) by mouth 2 (two) times daily.   Allergies  Allergen Reactions  . Penicillins Anaphylaxis  . Calcium-Containing Compounds   . Celebrex [Celecoxib]   . Daypro [Oxaprozin]   . Feldene [Piroxicam]    PMHx:   Past Medical History  Diagnosis Date  . Vitamin D deficiency   . Nodule of chest wall   . Heel pain     left foot  . Hypercholesteremia   . HTN (hypertension)   . Type II or unspecified type  diabetes mellitus without mention of complication, not stated as uncontrolled   . DJD (degenerative joint disease)   . Fatty liver    Immunization History  Administered Date(s) Administered  . Influenza Split 05/23/2013  . Influenza, High Dose Seasonal PF 05/30/2014  . Pneumococcal Polysaccharide-23 07/29/2013  . Td 08/31/2011   Past Surgical History  Procedure Laterality Date  . Tonsillectomy and adenoidectomy    . Total knee arthroplasty    . Breast surgery    . Cataract extraction    . Cardiac catheterization     FHx:    Reviewed / unchanged  SHx:    Reviewed / unchanged   Systems Review:  Constitutional: Denies fever, chills, wt changes, headaches, insomnia, fatigue, night sweats, change in appetite. Eyes: Denies redness, blurred vision, diplopia, discharge, itchy, watery eyes.  ENT: Denies discharge, congestion, post nasal drip, epistaxis, sore throat, earache, hearing loss, dental pain, tinnitus, vertigo, sinus pain, snoring.  CV: Denies chest pain, palpitations, irregular heartbeat, syncope, dyspnea, diaphoresis, orthopnea, PND, claudication or edema. Respiratory: denies cough, dyspnea, DOE, pleurisy, hoarseness, laryngitis, wheezing.  Gastrointestinal: Denies dysphagia, odynophagia, heartburn, reflux, water brash, abdominal pain or cramps, nausea, vomiting, bloating, diarrhea, constipation, hematemesis, melena, hematochezia  or hemorrhoids. Genitourinary: Denies dysuria, frequency, urgency, nocturia, hesitancy, discharge, hematuria or flank pain. Musculoskeletal: Denies arthralgias, myalgias, stiffness, jt. swelling, pain, limping or strain/sprain.  Skin: Denies pruritus, rash, hives, warts, acne, eczema or change in skin lesion(s). Neuro: No weakness, tremor, incoordination, spasms, paresthesia or pain. Psychiatric: Denies confusion, memory loss or sensory loss. Endo: Denies change in weight, skin or hair change.  Heme/Lymph: No excessive bleeding, bruising or enlarged lymph nodes.  Physical Exam  BP 124/72   Pulse 60  Temp 97.6 F   Resp 16  Ht 4' 10.5"   Wt 189 lb 9.6 oz     BMI 38.95   Appears well nourished and in no distress. Eyes: PERRLA, EOMs, conjunctiva no swelling or erythema. Sinuses: No frontal/maxillary tenderness ENT/Mouth: EAC's clear, TM's nl w/o erythema, bulging. Nares clear w/o erythema, swelling, exudates. Oropharynx clear without erythema or exudates. Oral hygiene is good. Tongue normal, non obstructing. Hearing intact.  Neck: Supple. Thyroid nl. Car 2+/2+ without bruits, nodes or JVD. Chest: Respirations nl with BS clear &  equal w/o rales, rhonchi, wheezing or stridor.  Cor: Heart sounds normal w/ regular rate and rhythm without sig. murmurs, gallops, clicks, or rubs. Peripheral pulses normal and equal  without edema.  Abdomen: Soft & bowel sounds normal. Non-tender w/o guarding, rebound, hernias, masses, or organomegaly.  Lymphatics: Unremarkable.  Musculoskeletal: Full ROM all peripheral extremities, joint stability, 5/5 strength, and normal gait.  Skin: Warm, dry without exposed rashes, lesions or ecchymosis apparent. Onychomycosis of all toenails x 10 Neuro: Cranial nerves intact, reflexes equal bilaterally. Sensory-motor testing grossly intact. Tendon reflexes grossly intact.  Pysch: Alert & oriented x 3.  Insight and judgement nl & appropriate. No ideations.  Assessment and Plan:  1. Essential hypertension  - TSH - d/c Verapamil ($$$) and HCTZ - REplace with bisoprolol-hctz [facility]) 5-6.25 ; Take 1 tablet by mouth daily  2. Hyperlipidemia  - Lipid panel  3. T2_NIDDM w/CKD  - Hemoglobin A1c - Insulin, fasting  4. Vitamin D deficiency  - Vit D  25 hydroxy (rtn osteoporosis monitoring)  5. Medication management  - CBC with Differential/Platelet - BASIC METABOLIC PANEL WITH GFR - Hepatic function panel - Magnesium  6. Nail fungus  - terbinafine (LAMISIL) 250 MG tablet; Take 1 tablet (250  mg total) by mouth daily.  Dispense: 90 tablet; Refill: 0   Recommended regular exercise, BP monitoring, weight control, and discussed med and SE's. Recommended labs to assess and monitor clinical status. Further disposition pending results of labs.

## 2014-10-08 NOTE — Patient Instructions (Signed)

## 2014-10-09 LAB — INSULIN, FASTING: Insulin fasting, serum: 5.9 u[IU]/mL (ref 2.0–19.6)

## 2014-10-09 LAB — VITAMIN D 25 HYDROXY (VIT D DEFICIENCY, FRACTURES): Vit D, 25-Hydroxy: 46 ng/mL (ref 30–100)

## 2014-10-19 ENCOUNTER — Encounter: Payer: Self-pay | Admitting: *Deleted

## 2014-10-21 ENCOUNTER — Encounter (HOSPITAL_COMMUNITY): Payer: Self-pay | Admitting: Emergency Medicine

## 2014-10-21 ENCOUNTER — Ambulatory Visit (INDEPENDENT_AMBULATORY_CARE_PROVIDER_SITE_OTHER): Payer: Medicare Other | Admitting: Physician Assistant

## 2014-10-21 ENCOUNTER — Encounter: Payer: Self-pay | Admitting: Physician Assistant

## 2014-10-21 ENCOUNTER — Other Ambulatory Visit (HOSPITAL_COMMUNITY): Payer: Self-pay

## 2014-10-21 ENCOUNTER — Emergency Department (HOSPITAL_COMMUNITY): Payer: Medicare Other

## 2014-10-21 ENCOUNTER — Inpatient Hospital Stay (HOSPITAL_COMMUNITY)
Admission: EM | Admit: 2014-10-21 | Discharge: 2014-10-26 | DRG: 242 | Disposition: A | Discharge status: Home / Self Care | Payer: Medicare Other | Attending: Internal Medicine | Admitting: Internal Medicine

## 2014-10-21 VITALS — BP 142/72 | HR 46 | Temp 98.9°F | Resp 16 | Ht 58.5 in | Wt 194.0 lb

## 2014-10-21 DIAGNOSIS — R001 Bradycardia, unspecified: Principal | ICD-10-CM

## 2014-10-21 DIAGNOSIS — Z959 Presence of cardiac and vascular implant and graft, unspecified: Secondary | ICD-10-CM

## 2014-10-21 DIAGNOSIS — I249 Acute ischemic heart disease, unspecified: Secondary | ICD-10-CM | POA: Diagnosis present

## 2014-10-21 DIAGNOSIS — I4892 Unspecified atrial flutter: Secondary | ICD-10-CM | POA: Diagnosis not present

## 2014-10-21 DIAGNOSIS — I5030 Unspecified diastolic (congestive) heart failure: Secondary | ICD-10-CM | POA: Insufficient documentation

## 2014-10-21 DIAGNOSIS — R0609 Other forms of dyspnea: Secondary | ICD-10-CM

## 2014-10-21 DIAGNOSIS — I10 Essential (primary) hypertension: Secondary | ICD-10-CM

## 2014-10-21 DIAGNOSIS — R0600 Dyspnea, unspecified: Secondary | ICD-10-CM

## 2014-10-21 DIAGNOSIS — Z0389 Encounter for observation for other suspected diseases and conditions ruled out: Secondary | ICD-10-CM

## 2014-10-21 DIAGNOSIS — E119 Type 2 diabetes mellitus without complications: Secondary | ICD-10-CM | POA: Diagnosis present

## 2014-10-21 DIAGNOSIS — R0602 Shortness of breath: Secondary | ICD-10-CM

## 2014-10-21 DIAGNOSIS — E785 Hyperlipidemia, unspecified: Secondary | ICD-10-CM | POA: Diagnosis present

## 2014-10-21 DIAGNOSIS — Z888 Allergy status to other drugs, medicaments and biological substances status: Secondary | ICD-10-CM

## 2014-10-21 DIAGNOSIS — R1013 Epigastric pain: Secondary | ICD-10-CM

## 2014-10-21 DIAGNOSIS — I509 Heart failure, unspecified: Secondary | ICD-10-CM | POA: Insufficient documentation

## 2014-10-21 DIAGNOSIS — R635 Abnormal weight gain: Secondary | ICD-10-CM

## 2014-10-21 DIAGNOSIS — M1990 Unspecified osteoarthritis, unspecified site: Secondary | ICD-10-CM | POA: Diagnosis present

## 2014-10-21 DIAGNOSIS — I5032 Chronic diastolic (congestive) heart failure: Secondary | ICD-10-CM

## 2014-10-21 DIAGNOSIS — IMO0001 Reserved for inherently not codable concepts without codable children: Secondary | ICD-10-CM

## 2014-10-21 DIAGNOSIS — Z7982 Long term (current) use of aspirin: Secondary | ICD-10-CM

## 2014-10-21 DIAGNOSIS — Z880 Allergy status to penicillin: Secondary | ICD-10-CM

## 2014-10-21 DIAGNOSIS — Z96659 Presence of unspecified artificial knee joint: Secondary | ICD-10-CM | POA: Diagnosis present

## 2014-10-21 DIAGNOSIS — E559 Vitamin D deficiency, unspecified: Secondary | ICD-10-CM | POA: Diagnosis present

## 2014-10-21 DIAGNOSIS — Z833 Family history of diabetes mellitus: Secondary | ICD-10-CM

## 2014-10-21 DIAGNOSIS — E782 Mixed hyperlipidemia: Secondary | ICD-10-CM | POA: Diagnosis present

## 2014-10-21 DIAGNOSIS — I5033 Acute on chronic diastolic (congestive) heart failure: Secondary | ICD-10-CM | POA: Diagnosis present

## 2014-10-21 DIAGNOSIS — I4510 Unspecified right bundle-branch block: Secondary | ICD-10-CM | POA: Diagnosis present

## 2014-10-21 DIAGNOSIS — Z79899 Other long term (current) drug therapy: Secondary | ICD-10-CM

## 2014-10-21 DIAGNOSIS — Z8249 Family history of ischemic heart disease and other diseases of the circulatory system: Secondary | ICD-10-CM

## 2014-10-21 DIAGNOSIS — Z87891 Personal history of nicotine dependence: Secondary | ICD-10-CM

## 2014-10-21 DIAGNOSIS — Z950 Presence of cardiac pacemaker: Secondary | ICD-10-CM | POA: Diagnosis not present

## 2014-10-21 DIAGNOSIS — I071 Rheumatic tricuspid insufficiency: Secondary | ICD-10-CM | POA: Diagnosis present

## 2014-10-21 DIAGNOSIS — I480 Paroxysmal atrial fibrillation: Secondary | ICD-10-CM | POA: Diagnosis present

## 2014-10-21 DIAGNOSIS — R609 Edema, unspecified: Secondary | ICD-10-CM

## 2014-10-21 DIAGNOSIS — I272 Other secondary pulmonary hypertension: Secondary | ICD-10-CM | POA: Diagnosis present

## 2014-10-21 DIAGNOSIS — M47814 Spondylosis without myelopathy or radiculopathy, thoracic region: Secondary | ICD-10-CM | POA: Diagnosis present

## 2014-10-21 DIAGNOSIS — I214 Non-ST elevation (NSTEMI) myocardial infarction: Secondary | ICD-10-CM | POA: Diagnosis present

## 2014-10-21 HISTORY — DX: Encounter for observation for other suspected diseases and conditions ruled out: Z03.89

## 2014-10-21 LAB — CBC WITH DIFFERENTIAL/PLATELET
BASOS PCT: 1 % (ref 0–1)
Basophils Absolute: 0.1 10*3/uL (ref 0.0–0.1)
Eosinophils Absolute: 0.1 10*3/uL (ref 0.0–0.7)
Eosinophils Relative: 2 % (ref 0–5)
HCT: 34 % — ABNORMAL LOW (ref 36.0–46.0)
Hemoglobin: 11.5 g/dL — ABNORMAL LOW (ref 12.0–15.0)
LYMPHS ABS: 2.4 10*3/uL (ref 0.7–4.0)
Lymphocytes Relative: 30 % (ref 12–46)
MCH: 28.3 pg (ref 26.0–34.0)
MCHC: 33.8 g/dL (ref 30.0–36.0)
MCV: 83.7 fL (ref 78.0–100.0)
MONO ABS: 0.9 10*3/uL (ref 0.1–1.0)
Monocytes Relative: 11 % (ref 3–12)
NEUTROS PCT: 56 % (ref 43–77)
Neutro Abs: 4.6 10*3/uL (ref 1.7–7.7)
Platelets: 223 10*3/uL (ref 150–400)
RBC: 4.06 MIL/uL (ref 3.87–5.11)
RDW: 15.2 % (ref 11.5–15.5)
WBC: 8.2 10*3/uL (ref 4.0–10.5)

## 2014-10-21 LAB — BASIC METABOLIC PANEL
Anion gap: 7 (ref 5–15)
BUN: 21 mg/dL (ref 6–23)
CHLORIDE: 105 mmol/L (ref 96–112)
CO2: 25 mmol/L (ref 19–32)
Calcium: 9.3 mg/dL (ref 8.4–10.5)
Creatinine, Ser: 1.07 mg/dL (ref 0.50–1.10)
GFR, EST AFRICAN AMERICAN: 57 mL/min — AB (ref >90)
GFR, EST NON AFRICAN AMERICAN: 49 mL/min — AB (ref >90)
GLUCOSE: 143 mg/dL — AB (ref 70–99)
Potassium: 4.8 mmol/L (ref 3.5–5.1)
SODIUM: 137 mmol/L (ref 135–145)

## 2014-10-21 LAB — BRAIN NATRIURETIC PEPTIDE: B Natriuretic Peptide: 649.3 pg/mL — ABNORMAL HIGH (ref 0.0–100.0)

## 2014-10-21 LAB — I-STAT TROPONIN, ED: TROPONIN I, POC: 0.02 ng/mL (ref 0.00–0.08)

## 2014-10-21 LAB — CBG MONITORING, ED: Glucose-Capillary: 112 mg/dL — ABNORMAL HIGH (ref 70–99)

## 2014-10-21 LAB — MRSA PCR SCREENING: MRSA BY PCR: NEGATIVE

## 2014-10-21 MED ORDER — LISINOPRIL 20 MG PO TABS
20.0000 mg | ORAL_TABLET | Freq: Two times a day (BID) | ORAL | Status: DC
  Administered 2014-10-21 – 2014-10-26 (×9): 20 mg via ORAL
  Filled 2014-10-21 (×13): qty 1

## 2014-10-21 MED ORDER — MAGNESIUM 500 MG PO TABS: 500.0000 mg | ORAL_TABLET | Freq: Every day | ORAL | Status: DC

## 2014-10-21 MED ORDER — HEPARIN (PORCINE) IN NACL 100-0.45 UNIT/ML-% IJ SOLN
1150.0000 [IU]/h | INTRAMUSCULAR | Status: DC
  Administered 2014-10-21: 1000 [IU]/h via INTRAVENOUS
  Administered 2014-10-22: 1150 [IU]/h via INTRAVENOUS
  Filled 2014-10-21 (×3): qty 250

## 2014-10-21 MED ORDER — INSULIN ASPART 100 UNIT/ML ~~LOC~~ SOLN
0.0000 [IU] | Freq: Three times a day (TID) | SUBCUTANEOUS | Status: DC
  Administered 2014-10-22: 2 [IU] via SUBCUTANEOUS
  Administered 2014-10-23 – 2014-10-24 (×2): 1 [IU] via SUBCUTANEOUS
  Administered 2014-10-24: 2 [IU] via SUBCUTANEOUS
  Administered 2014-10-24: 3 [IU] via SUBCUTANEOUS
  Administered 2014-10-25 – 2014-10-26 (×3): 1 [IU] via SUBCUTANEOUS

## 2014-10-21 MED ORDER — FUROSEMIDE 40 MG PO TABS: 40.0000 mg | ORAL_TABLET | Freq: Every day | ORAL | Status: DC

## 2014-10-21 MED ORDER — PANTOPRAZOLE SODIUM 40 MG PO TBEC
40.0000 mg | DELAYED_RELEASE_TABLET | Freq: Every day | ORAL | Status: DC
  Administered 2014-10-22 – 2014-10-26 (×5): 40 mg via ORAL
  Filled 2014-10-21 (×5): qty 1

## 2014-10-21 MED ORDER — ACETAMINOPHEN 325 MG PO TABS: 650.0000 mg | ORAL_TABLET | Freq: Four times a day (QID) | ORAL | Status: DC | PRN

## 2014-10-21 MED ORDER — ONDANSETRON HCL 4 MG/2ML IJ SOLN: 4.0000 mg | Freq: Four times a day (QID) | INTRAMUSCULAR | Status: DC | PRN

## 2014-10-21 MED ORDER — ASPIRIN EC 325 MG PO TBEC
325.0000 mg | DELAYED_RELEASE_TABLET | Freq: Every day | ORAL | Status: DC
  Administered 2014-10-22: 325 mg via ORAL
  Filled 2014-10-21: qty 1

## 2014-10-21 MED ORDER — QUINAPRIL HCL 10 MG PO TABS: 20.0000 mg | ORAL_TABLET | Freq: Two times a day (BID) | ORAL | Status: DC

## 2014-10-21 MED ORDER — FUROSEMIDE 10 MG/ML IJ SOLN
40.0000 mg | Freq: Two times a day (BID) | INTRAMUSCULAR | Status: DC
  Administered 2014-10-21 – 2014-10-24 (×4): 40 mg via INTRAVENOUS
  Filled 2014-10-21 (×7): qty 4

## 2014-10-21 MED ORDER — SODIUM CHLORIDE 0.9 % IJ SOLN
3.0000 mL | Freq: Two times a day (BID) | INTRAMUSCULAR | Status: DC
  Administered 2014-10-22 – 2014-10-24 (×4): 3 mL via INTRAVENOUS

## 2014-10-21 MED ORDER — AMLODIPINE BESYLATE 5 MG PO TABS: 5.0000 mg | ORAL_TABLET | Freq: Every day | ORAL | Status: DC

## 2014-10-21 MED ORDER — ACETAMINOPHEN 650 MG RE SUPP: 650.0000 mg | Freq: Four times a day (QID) | RECTAL | Status: DC | PRN

## 2014-10-21 MED ORDER — ONDANSETRON HCL 4 MG PO TABS: 4.0000 mg | ORAL_TABLET | Freq: Four times a day (QID) | ORAL | Status: DC | PRN

## 2014-10-21 MED ORDER — PRAVASTATIN SODIUM 40 MG PO TABS
40.0000 mg | ORAL_TABLET | Freq: Every day | ORAL | Status: DC
  Administered 2014-10-21 – 2014-10-25 (×5): 40 mg via ORAL
  Filled 2014-10-21 (×7): qty 1

## 2014-10-21 MED ORDER — HEPARIN BOLUS VIA INFUSION
3500.0000 [IU] | Freq: Once | INTRAVENOUS | Status: AC
  Administered 2014-10-21: 3500 [IU] via INTRAVENOUS
  Filled 2014-10-21: qty 3500

## 2014-10-21 MED ORDER — AMLODIPINE BESYLATE 5 MG PO TABS
5.0000 mg | ORAL_TABLET | Freq: Every day | ORAL | Status: DC
  Administered 2014-10-22 – 2014-10-26 (×5): 5 mg via ORAL
  Filled 2014-10-21 (×5): qty 1

## 2014-10-21 MED ORDER — POTASSIUM CHLORIDE CRYS ER 20 MEQ PO TBCR
20.0000 meq | EXTENDED_RELEASE_TABLET | Freq: Every day | ORAL | Status: DC
  Administered 2014-10-22 – 2014-10-26 (×5): 20 meq via ORAL
  Filled 2014-10-21 (×5): qty 1

## 2014-10-21 NOTE — Progress Notes (Signed)
ANTICOAGULATION CONSULT NOTE - Initial Consult  Pharmacy Consult for heparin Indication: Aflutter  Allergies  Allergen Reactions  . Penicillins Anaphylaxis  . Calcium-Containing Compounds     "calcium deposits on skin"  . Celebrex [Celecoxib] Itching  . Daypro [Oxaprozin] Itching  . Feldene [Piroxicam] Other (See Comments)    Bleeding     Patient Measurements: TBW - 88kg IBW ~45.5 kg Heparin Dosing weight - 66.2 kg  Vital Signs: Temp: 98.5 F (36.9 C) (02/22 1728) Temp Source: Oral (02/22 1728) BP: 126/63 mmHg (02/22 1900) Pulse Rate: 38 (02/22 1900)  Labs:  Recent Labs  10/21/14 1729  HGB 11.5*  HCT 34.0*  PLT 223  CREATININE 1.07    Estimated Creatinine Clearance: 42.1 mL/min (by C-G formula based on Cr of 1.07).   Medical History: Past Medical History  Diagnosis Date  . Vitamin D deficiency   . Nodule of chest wall   . Heel pain     left foot  . Hypercholesteremia   . HTN (hypertension)   . Type II or unspecified type diabetes mellitus without mention of complication, not stated as uncontrolled   . DJD (degenerative joint disease)   . Fatty liver     Medications:   (Not in a hospital admission)  Assessment: 80 yo F presents on 2/22 with SOB and chest pain. Pharmacy to dose heparin for Aflutter. No PTA anticoag. Hgb 11.5, plts wnl. No s/s of bleed.  Goal of Therapy:  Heparin level 0.3-0.7 units/ml Monitor platelets by anticoagulation protocol: Yes   Plan:  Give 3500 units of heparin BOLUS Start heparin gtt at 1,000 units/hr Check 8 hr HL at 0400 tomorrow Monitor daily HL, CBC, s/s of bleed  Armandina StammerBATCHELDER,Sawyer Mentzer J 10/21/2014,7:46 PM

## 2014-10-21 NOTE — Patient Instructions (Signed)
Stop new med, ziac/bispropolol Start amlodipine 5mg  1/2 pill daily and lasix 40mg  daily in the morning Monitor BP and weight.   Follow up 1 week  Do the following things EVERYDAY: 1) Weigh yourself in the morning before breakfast. Write it down and keep it in a log. 2) Take your medicines as prescribed 3) Eat low salt foods-Limit salt (sodium) to 2000 mg per day. Best thing to do is avoid processed foods.   4) Stay as active as you can everyday 5) Limit all fluids for the day to less than 2 liters  Call your doctor if:  Anytime you have any of the following symptoms:  1) 3 pound weight gain in 24 hours or 5 pounds in 1 week  2) shortness of breath, with or without a dry hacking cough  3) swelling in the hands, feet or stomach  4) if you have to sleep on extra pillows at night in order to breathe. 5) after laying down at night for 20-30 mins, you wake up short of breath.   These can all be signs of fluid overload.

## 2014-10-21 NOTE — Progress Notes (Signed)
Assessment and Plan: Fatigue/SOB/epigastric pain- with symptomatic sinus bradycardia due to BB- ? CHF/want to rule out MI- stop the ziac and will send ER to rule out MI/CHF, husband is driving her to ER   HPI 80 y.o.female with history of DM II with CKD, chol, obesity, GERD presents for follow up for AB pain, SOB.  Normal myoview lexiscan 2013.   She was seen 02/09 for 3 month OV. Her verapamiil and HCTZ was stopped due to cost and she was put on ziac 5/6.25. For 1 wee she has had epigastric pressure radiating up into her chest/neck, her stomach and feet feel bloated like she is holding onto fluid. She has had increasing shortness of breath with walking to the mail box, decreased appetite. She is propped up on 2-3 pillows, no pnd, nocturia 2x a night.  HR is 36-40 in the office and her Bp is BP: (!) 142/72 mmHg  Wt Readings from Last 3 Encounters:  10/21/14 194 lb (87.998 kg)  10/08/14 189 lb 9.6 oz (86.002 kg)  07/08/14 189 lb (85.73 kg)   Lab Results  Component Value Date   CREATININE 0.78 10/08/2014   BUN 14 10/08/2014   NA 138 10/08/2014   K 4.6 10/08/2014   CL 101 10/08/2014   CO2 29 10/08/2014    Past Medical History  Diagnosis Date  . Vitamin D deficiency   . Nodule of chest wall   . Heel pain     left foot  . Hypercholesteremia   . HTN (hypertension)   . Type II or unspecified type diabetes mellitus without mention of complication, not stated as uncontrolled   . DJD (degenerative joint disease)   . Fatty liver      Allergies  Allergen Reactions  . Penicillins Anaphylaxis  . Calcium-Containing Compounds   . Celebrex [Celecoxib]   . Daypro [Oxaprozin]   . Feldene [Piroxicam]       Current Outpatient Prescriptions on File Prior to Visit  Medication Sig Dispense Refill  . Acetaminophen (ARTHRITIS PAIN RELIEVER PO) Take by mouth as needed.    Marland Kitchen. aspirin 81 MG tablet Take 81 mg by mouth daily.    . bisoprolol-hydrochlorothiazide (ZIAC) 5-6.25 MG per tablet Take 1  tablet by mouth daily. 90 tablet 99  . Cetirizine HCl (ZYRTEC ALLERGY PO) Take 1 tablet by mouth daily.    . Cholecalciferol (VITAMIN D) 2000 UNITS CAPS Take 2 capsules by mouth daily.    Marland Kitchen. FREESTYLE LITE test strip USE ONE STRIP TO CHECK GLUCOSE ONCE DAILY 100 each PRN  . glucose blood (FREESTYLE LITE) test strip ONE STRIP TO CHECK GLUCOSE ONCE DAILY DX E10.9 50 each 2  . Lancets (FREESTYLE) lancets USE ONE LANCET TO CHECK GLUCOSE EVERY DAY 100 each 0  . Magnesium 500 MG TABS Take 500 mg by mouth daily.    . metFORMIN (GLUCOPHAGE XR) 500 MG 24 hr tablet Take 2 tablets twice daily with meals for Diabetes 360 tablet 99  . omeprazole (PRILOSEC) 20 MG capsule Take 20 mg by mouth daily.    . potassium chloride SA (K-DUR,KLOR-CON) 20 MEQ tablet Take 1 tablet (20 mEq total) by mouth daily. 90 tablet 4  . pravastatin (PRAVACHOL) 40 MG tablet TAKE ONE TABLET BY MOUTH ONCE DAILY AT BEDTIME FOR CHOLESTEROL 90 tablet 1  . quinapril (ACCUPRIL) 20 MG tablet Take 1 tablet (20 mg total) by mouth 2 (two) times daily. 180 tablet 3  . terbinafine (LAMISIL) 250 MG tablet Take 1 tablet (250 mg  total) by mouth daily. 90 tablet 0   No current facility-administered medications on file prior to visit.    ROS:  Review of Systems  Constitutional: Positive for malaise/fatigue. Negative for fever, chills, weight loss and diaphoresis.  HENT: Negative.   Eyes: Negative.   Respiratory: Positive for shortness of breath. Negative for cough, hemoptysis, sputum production and wheezing.   Cardiovascular: Positive for orthopnea and leg swelling. Negative for chest pain, palpitations, claudication and PND.  Gastrointestinal: Positive for nausea (decreased appetite) and abdominal pain. Negative for heartburn, vomiting, diarrhea, constipation, blood in stool and melena.  Genitourinary: Positive for frequency and flank pain (left flank). Negative for dysuria, urgency and hematuria.  Musculoskeletal: Negative.   Skin: Negative.   Negative for rash.  Neurological: Positive for weakness. Negative for dizziness, tingling, tremors, sensory change, speech change, focal weakness, seizures and loss of consciousness.  Psychiatric/Behavioral: Negative.      Physical Exam: Filed Weights   10/21/14 1445  Weight: 194 lb (87.998 kg)   BP 142/72 mmHg  Pulse 46  Temp(Src) 98.9 F (37.2 C)  Resp 16  Ht 4' 10.5" (1.486 m)  Wt 194 lb (87.998 kg)  BMI 39.85 kg/m2  SpO2 98% General Appearance: Well nourished, obese, in no apparent distress. Eyes: PERRLA, EOMs, conjunctiva no swelling or erythema Sinuses: No Frontal/maxillary tenderness ENT/Mouth: Ext aud canals clear, TMs without erythema, bulging. No erythema, swelling, or exudate on post pharynx.  Tonsils not swollen or erythematous. Hearing normal.  Neck: Supple, thyroid normal.  Respiratory: Respiratory effort normal, BS equal bilaterally without rales, rhonchi, wheezing or stridor.  Cardio: RRR with no MRGs, sinus brady. Brisk peripheral pulses with 1+ edema.  Abdomen: Soft, + BS, distended with epigastric tenderness, no guarding, rebound, hernias, masses. Lymphatics: Non tender without lymphadenopathy.  Musculoskeletal: Full ROM, 4/5 strength, normal gait.  Skin: Warm, dry without rashes, lesions, ecchymosis.  Neuro: Cranial nerves intact. Normal muscle tone, no cerebellar symptoms.   Psych: Awake and oriented X 3, normal affect, Insight and Judgment appropriate.     Quentin Mulling, PA-C 3:31 PM [facility]

## 2014-10-21 NOTE — Consult Note (Signed)
CARDIOLOGY CONSULT NOTE   Patient ID: Tracey Todd MRN: [id number], DOB/AGE: February 26, 1935   Admit date: 10/21/2014 Date of Consult: 10/21/2014   Primary Physician: [name], MD Primary Cardiologist: Charlton Haws, MD  Pt. Profile   77F with HTN, HLD, DM, and arthritis who p/w new onset HF, bradycardia, and NSTEMI.  Problem List  Past Medical History  Diagnosis Date  . Vitamin D deficiency   . Nodule of chest wall   . Heel pain     left foot  . Hypercholesteremia   . HTN (hypertension)   . Type II or unspecified type diabetes mellitus without mention of complication, not stated as uncontrolled   . DJD (degenerative joint disease)   . Fatty liver     Past Surgical History  Procedure Laterality Date  . Tonsillectomy and adenoidectomy    . Total knee arthroplasty    . Breast surgery    . Cataract extraction    . Cardiac catheterization       Allergies  Allergies  Allergen Reactions  . Penicillins Anaphylaxis  . Calcium-Containing Compounds     "calcium deposits on skin"  . Celebrex [Celecoxib] Itching  . Daypro [Oxaprozin] Itching  . Feldene [Piroxicam] Other (See Comments)    Bleeding     HPI    77F with HTN, HLD, DM, and arthritis who p/w new onset HF, bradycardia, and NSTEMI.  On 10/08/14, Tracey Todd's HTN regimen was switched from verapamil 240 daily and HCTZ  daily  to bisoprolol-hctz [facility]) 5-6.25 due to cost concerns. She reports that since that time she has noted exertional chest pressure and dyspnea. Here symptoms worsened over the past week and she developed LE edema, 5# weight gain, and PND. Her use of 2 pillows has been long standing. Noted palpitations but no syncope.  Denies taking any medications except for as prescribed. She presented to her PCP today who noted her to be bradycardic to the 30s and she was transferred to the [facility] ED.   On arrival to the ED she was hemodynamically stable. Initial ECG demonstrated bradycardia with  low atrial rhythm. NSSTTWC. Unchanged compared to prior on 12/06/11. Labs were notable for K 4.8, Cr 1.07, BNP 649.POC TnI is 0.02. CXR demonstrated cardiomegaly without overt edema. An ECG at  19:36 demonstrated an atypical appearing AFL with variable block and ventricular rate of 55. An ECG on 21:23 demonstrated junctional bradycardia. no atrial activity. NSSTTWC. She was started on UFH for AFL and admitted to the step down on the medicine. Cardiology was consulted for bradycardia and HF.   Telemetry in the stepdown demonstrated a variety of rhythms, AFL with slow ventricular response, sinus bradycardia, junctional bradycardia, and brief periods of a ventricular rhythm with a RBBB like morphology. Rates in the 30s for all rhythms. After admission to the SDU, a 2nd troponin returned positive at 0.22.  Inpatient Medications  . [START ON 10/22/2014] amLODipine  5 mg Oral Daily  . [START ON 10/22/2014] aspirin EC  325 mg Oral Daily  . furosemide  40 mg Intravenous Q12H  . [START ON 10/22/2014] insulin aspart  0-9 Units Subcutaneous TID WC  . lisinopril  20 mg Oral BID  . [START ON 10/22/2014] pantoprazole  40 mg Oral Daily  . [START ON 10/22/2014] potassium chloride SA  20 mEq Oral Daily  . pravastatin  40 mg Oral q1800  . sodium chloride  3 mL Intravenous Q12H    Family History Family History  Problem Relation Age of  Onset  . Hypertension    . Cancer    . Diabetes    . Heart disease       Social History History   Social History  . Marital Status: Married    Spouse Name: N/A  . Number of Children: N/A  . Years of Education: N/A   Occupational History  . Not on file.   Social History Main Topics  . Smoking status: Former Smoker    Quit date: 12/06/1963  . Smokeless tobacco: Not on file  . Alcohol Use: No  . Drug Use: Not on file  . Sexual Activity: Not on file   Other Topics Concern  . Not on file   Social History Narrative     Review of Systems  General:  No chills,  fever, night sweats. + weight changes.  Cardiovascular:  See HPI Dermatological: No rash, lesions/masses Respiratory: See HPI Urologic: No hematuria, dysuria Abdominal:   No nausea, vomiting, diarrhea, bright red blood per rectum, melena, or hematemesis Neurologic:  No visual changes, wkns, changes in mental status. All other systems reviewed and are otherwise negative except as noted above.  Physical Exam  Blood pressure 148/55, pulse 46, temperature 98.1 F (36.7 C), temperature source Oral, resp. rate 16, height 4' 10.5" (1.486 m), weight 88.6 kg (195 lb 5.2 oz), SpO2 97 %.  General: Pleasant, NAD Psych: Normal affect. Neuro: Alert and oriented X 3. Moves all extremities spontaneously. HEENT: Normal  Neck: Supple without bruits or JVD. Lungs:  Resp regular and unlabored, CTA. Heart: Regular, bradycardic. no s3, s4, or murmurs. Abdomen: Soft, non-tender, non-distended, BS + x 4.  Extremities: No clubbing, cyanosis. DP/PT/Radials 2+ and equal bilaterally. 1+ LE edema. JVP to mid neck.   Labs  No results for input(s): CKTOTAL, CKMB, TROPONINI in the last 72 hours. Lab Results  Component Value Date   WBC 8.2 10/21/2014   HGB 11.5* 10/21/2014   HCT 34.0* 10/21/2014   MCV 83.7 10/21/2014   PLT 223 10/21/2014    Recent Labs Lab 10/21/14 1729  NA 137  K 4.8  CL 105  CO2 25  BUN 21  CREATININE 1.07  CALCIUM 9.3  GLUCOSE 143*   Lab Results  Component Value Date   CHOL 163 10/08/2014   HDL 58 10/08/2014   LDLCALC 84 10/08/2014   TRIG 104 10/08/2014   Lab Results  Component Value Date   DDIMER * 12/07/2009    0.76        AT THE INHOUSE ESTABLISHED CUTOFF VALUE OF 0.48 ug/mL FEU, THIS ASSAY HAS BEEN DOCUMENTED IN THE LITERATURE TO HAVE A SENSITIVITY AND NEGATIVE PREDICTIVE VALUE OF AT LEAST 98 TO 99%.  THE TEST RESULT SHOULD BE CORRELATED WITH AN ASSESSMENT OF THE CLINICAL PROBABILITY OF DVT / VTE.    Radiology/Studies  Dg Chest 2 View  10/21/2014    CLINICAL DATA:  Shortness of breath.  Chest tightness since Friday.  EXAM: CHEST  2 VIEW  COMPARISON:  01/29/2013  FINDINGS: Stable mild enlargement of the cardiopericardial silhouette with tortuous thoracic aorta. Mildly indistinct pulmonary vasculature with linear densities at the lung bases suggesting subsegmental atelectasis or mild interstitial accentuation which is new compared to 01/29/13.  No pleural effusion. Thoracic spondylosis. Degenerative AC joint arthropathy, right greater than left.  IMPRESSION: 1. Cardiomegaly without overt edema. Faint linear opacities at the lung bases may reflect subsegmental atelectasis or mild nonspecific reticulation, but no definite Kerley B-lines are observed two specifically indicate interstitial pulmonary edema.  Electronically Signed   By: Gaylyn Rong M.D.   On: 10/21/2014 18:05    12/22/11 TTE - Left ventricle: The cavity size was normal. Systolic function was normal. The estimated ejection fraction was in the range of 60% to 65%. Wall motion was normal; there were no regional wall motion abnormalities. Doppler parameters are consistent with abnormal left ventricular relaxation (grade 1 diastolic dysfunction). - Aortic valve: Trivial regurgitation. - Atrial septum: No defect or patent foramen ovale was identified. - Pulmonary arteries: PA peak pressure: 42mm Hg (S).  12/28/11: Normal myovue study with no evidence of ischemia or infarction   ECG  17:24: bradycardia with low atrial rhythm. NSSTTWC. Unchanged compared to prior on 12/06/11 19:36: atypical appearing AFL with variable block. ventricular rate of 55 21:23: junctional bradycardia. no atrial activity. NSSTTWC   ASSESSMENT AND PLAN  49F with HTN, HLD, DM, and arthritis who p/w new onset HF, bradycardia, and NSTEMI. She has had exertional symptoms for a few weeks that began around the time of her antihypertensive med change. It begs the question as to whether or not the  decrease in HCTZ uncovered some CHF. She has now ruled in for an AMI suggesting that CAD may have played a role in this as well.  It seems plausible that the recent decline may be related to superimposed arrhythmias (various bradycardias and atrial flutter) and/or the MI. Of note, She has had long standing bradycardia with a low atrial rhythm dating back to 2013.   CHF, etiology unclear. She is at risk for CAD and has ruled in for MI. Suspect bradycardia is important exacerbating factor as well.  1. Lasix  IV BID 2. Obtain TTE  Bradycardia. Longstanding sinus node abnormalities as evidenced by longstanding low atrial rhythm. She now has evidence of progression of her conduction system disease and has had periods of sinus arrest with ventricular escapes. She a suggestion of infranodal disease as there are times where her junctional rhythm is absent and she is left with a ventricular escape. I doubt that this can all be attributed to the bisoprolol. Some of this could be related to AMI. 1. Hold beta blocker 2. Obtain TTE 3. Will very likely need a permanent pacemaker  NSTEMI. Unclear if demand or Type I MI. Given exertional CP will need to treat as ACS.  1. UFH 2. Give  ASA now, followed by  daily. 3. Start atorvastain  4. No beta blocker given bradycardia  AFL, new onset. CHADSVASC of 6. Rate controlled (or frankly bradycardic) during periods of AFL 1. UFH 2. No nodal agents  Please make NPO for possible procedure   Signed, Glori Luis, MD 10/21/2014, 10:13 PM

## 2014-10-21 NOTE — H&P (Signed)
[facility] History and Physical  Tracey Todd VZD:[id number] DOB: Jul 31, 1935 DOA: 10/21/2014  Referring physician: ER physician. PCP: Tracey Corwin, MD   Chief Complaint: Shortness of breath and bradycardia.  HPI: Tracey Todd is a 80 y.o. female with history of hypertension, hyperlipidemia, diabetes mellitus type 2 was referred to the ER by patient's primary care physician as patient was found to be bradycardic and also had signs and symptoms of CHF. Patient had a recent change of medications due to cost reasons for which patient's verapamil and HCTZ was changed to Ziac(bisoprolol/HCTZ 5/6.25 mg) over the last 2 weeks. Since then patient has been having increasing shortness of breath weight gain and also feeling fatigued with epigastric discomfort. Patient also states she has gained 5 pounds. Patient says epigastric discomfort is not new but has been there for some time over the last 3 months for which she was prescribed PPI and also patient had a colonoscopy last month. In the ER patient was found to be bradycardic with initial rhythm showing atrial flutter and later on junctional rhythm. Patient was started on heparin infusion and admitted for further workup. Patient was also given IV Lasix. Patient denies any fever or chills nausea vomiting or diarrhea.   Review of Systems: As presented in the history of presenting illness, rest negative.  Past Medical History  Diagnosis Date  . Vitamin D deficiency   . Nodule of chest wall   . Heel pain     left foot  . Hypercholesteremia   . HTN (hypertension)   . Type II or unspecified type diabetes mellitus without mention of complication, not stated as uncontrolled   . DJD (degenerative joint disease)   . Fatty liver    Past Surgical History  Procedure Laterality Date  . Tonsillectomy and adenoidectomy    . Total knee arthroplasty    . Breast surgery    . Cataract extraction    . Cardiac catheterization     Social  History:  reports that she quit smoking about 50 years ago. She does not have any smokeless tobacco history on file. She reports that she does not drink alcohol. Her drug history is not on file. Where does patient live home. Can patient participate in ADLs? Yes.  Allergies  Allergen Reactions  . Penicillins Anaphylaxis  . Calcium-Containing Compounds     "calcium deposits on skin"  . Celebrex [Celecoxib] Itching  . Daypro [Oxaprozin] Itching  . Feldene [Piroxicam] Other (See Comments)    Bleeding     Family History:  Family History  Problem Relation Age of Onset  . Hypertension    . Cancer    . Diabetes    . Heart disease        Prior to Admission medications   Medication Sig Start Date End Date Taking? Authorizing Provider  Acetaminophen (ARTHRITIS PAIN RELIEVER PO) Take by mouth as needed.   Yes Historical Provider, MD  aspirin 81 MG tablet Take 81 mg by mouth daily.   Yes Historical Provider, MD  Cetirizine HCl (ZYRTEC ALLERGY PO) Take 1 tablet by mouth daily. 11/28/12  Yes Historical Provider, MD  Cholecalciferol (VITAMIN D) 2000 UNITS CAPS Take 2 capsules by mouth daily.   Yes Historical Provider, MD  FREESTYLE LITE test strip USE ONE STRIP TO CHECK GLUCOSE ONCE DAILY 03/06/14  Yes Quentin Mulling, PA-C  Lancets (FREESTYLE) lancets USE ONE LANCET TO CHECK GLUCOSE EVERY DAY 08/05/14  Yes Quentin Mulling, PA-C  Magnesium 500 MG TABS Take 500  mg by mouth daily.   Yes Historical Provider, MD  metFORMIN (GLUCOPHAGE XR) 500 MG 24 hr tablet Take 2 tablets twice daily with meals for Diabetes 04/04/14 04/05/15 Yes Lucky CowboyWilliam McKeown, MD  omeprazole (PRILOSEC) 20 MG capsule Take 20 mg by mouth daily.   Yes Historical Provider, MD  potassium chloride SA (K-DUR,KLOR-CON) 20 MEQ tablet Take 1 tablet (20 mEq total) by mouth daily. 07/05/14  Yes Lucky CowboyWilliam McKeown, MD  pravastatin (PRAVACHOL) 40 MG tablet TAKE ONE TABLET BY MOUTH ONCE DAILY AT BEDTIME FOR CHOLESTEROL 08/19/14  Yes Lucky CowboyWilliam McKeown, MD   quinapril (ACCUPRIL) 20 MG tablet Take 1 tablet (20 mg total) by mouth 2 (two) times daily. 07/05/14  Yes Lucky CowboyWilliam McKeown, MD  amLODipine (NORVASC) 5 MG tablet Take 1 tablet (5 mg total) by mouth daily. 10/21/14   Quentin MullingAmanda Collier, PA-C  furosemide (LASIX) 40 MG tablet Take 1 tablet (40 mg total) by mouth daily. 10/21/14   Quentin MullingAmanda Collier, PA-C  glucose blood (FREESTYLE LITE) test strip ONE STRIP TO CHECK GLUCOSE ONCE DAILY DX E10.9 08/05/14   Quentin MullingAmanda Collier, PA-C    Physical Exam: Filed Vitals:   10/21/14 2000 10/21/14 2015 10/21/14 2030 10/21/14 2100  BP: 133/59 160/65 154/61 136/64  Pulse: 40 49 37 37  Temp:      TempSrc:      Resp: 19 14 20 17   SpO2: 95% 96% 97% 96%     General:  Moderately built and nourished.  Eyes: Anicteric no pallor.  ENT: No discharge from the ears eyes nose or mouth.  Neck: Mildly elevated JVD no mass felt.  Cardiovascular: S1 and S2 heard.  Respiratory: No rhonchi or crepitations.  Abdomen: Soft nontender bowel sounds present.  Skin: No rash.  Musculoskeletal: Bilateral lower extremity edema.  Psychiatric: Appears normal.  Neurologic: Alert awake oriented to time place and person. Moves all extremities.  Labs on Admission:  Basic Metabolic Panel:  Recent Labs Lab 10/21/14 1729  NA 137  K 4.8  CL 105  CO2 25  GLUCOSE 143*  BUN 21  CREATININE 1.07  CALCIUM 9.3   Liver Function Tests: No results for input(s): AST, ALT, ALKPHOS, BILITOT, PROT, ALBUMIN in the last 168 hours. No results for input(s): LIPASE, AMYLASE in the last 168 hours. No results for input(s): AMMONIA in the last 168 hours. CBC:  Recent Labs Lab 10/21/14 1729  WBC 8.2  NEUTROABS 4.6  HGB 11.5*  HCT 34.0*  MCV 83.7  PLT 223   Cardiac Enzymes: No results for input(s): CKTOTAL, CKMB, CKMBINDEX, TROPONINI in the last 168 hours.  BNP (last 3 results)  Recent Labs  10/21/14 1729  BNP 649.3*    ProBNP (last 3 results) No results for input(s): PROBNP in  the last 8760 hours.  CBG: No results for input(s): GLUCAP in the last 168 hours.  Radiological Exams on Admission: Dg Chest 2 View  10/21/2014   CLINICAL DATA:  Shortness of breath.  Chest tightness since Friday.  EXAM: CHEST  2 VIEW  COMPARISON:  01/29/2013  FINDINGS: Stable mild enlargement of the cardiopericardial silhouette with tortuous thoracic aorta. Mildly indistinct pulmonary vasculature with linear densities at the lung bases suggesting subsegmental atelectasis or mild interstitial accentuation which is new compared to 01/29/13.  No pleural effusion. Thoracic spondylosis. Degenerative AC joint arthropathy, right greater than left.  IMPRESSION: 1. Cardiomegaly without overt edema. Faint linear opacities at the lung bases may reflect subsegmental atelectasis or mild nonspecific reticulation, but no definite Kerley B-lines are observed two  specifically indicate interstitial pulmonary edema.   Electronically Signed   By: Gaylyn Rong M.D.   On: 10/21/2014 18:05    EKG: Independently reviewed. Initial EKG showing atrial flutter controlled rate and the second one is showing junctional rhythm.  Assessment/Plan Principal Problem:   Bradycardia Active Problems:   HTN (hypertension)   New onset atrial flutter   Diabetes mellitus type 2, controlled   Chronic diastolic heart failure   1. Bradycardia - will initial EKG showing atrial flutter and a subsequent one showing junctional rhythm. At this time we will discontinue patient's beta blockers and rate limiting medication and patient has been placed on heparin infusion which will be continued. Check thyroid function test and I have consulted cardiologist. 2. Decompensated CHF - 2-D echo done in 2013 was showing EF of 60-65% with grade 1 diastolic dysfunction. At this time he had continued patient on Lasix. We will cycle cardiac markers and will get further recommendations from cardiologist. 3. New onset atrial flutter - with chads 2 vasc  score of 6. Patient is presently on heparin. 4. Hypertension - holding her beta blockers due to bradycardia. 5. Diabetes mellitus type 2 - while inpatient and we will hold off metformin in anticipation of possible cardiac procedures. Patient will be placed on sliding scale coverage.   DVT Prophylaxis full dose heparin infusion.  Code Status: Full code.  Family Communication: Patient's husband and other family members at the bedside.  Disposition Plan: Admit to inpatient.    KAKRAKANDY,ARSHAD N. [facility] Pager [phone].  If 7PM-7AM, please contact night-coverage [email or web address] Password [facility] 10/21/2014, 9:52 PM

## 2014-10-21 NOTE — ED Notes (Addendum)
Pt c/o SOB and some chest tightness with swelling in legs and 5lb weight gain; pt sts recent change in BP meds

## 2014-10-21 NOTE — ED Notes (Signed)
CBG 112.  

## 2014-10-21 NOTE — ED Provider Notes (Signed)
CSN: [id number]     Arrival date & time 10/21/14  1659 History   First MD Initiated Contact with Patient 10/21/14 1732     Chief Complaint  Patient presents with  . Shortness of Breath  . Chest Pain     (Consider location/radiation/quality/duration/timing/severity/associated sxs/prior Treatment) The history is provided by the patient and medical records. No language interpreter was used.     Tracey Todd is a 80 y.o. female  with a hx of chest wall nodule, high cholesterol, hypertension, non-insulin-dependent diabetes, fatty liver presents to the Emergency Department complaining of gradual, persistent, progressively worsening shortness of breath and chest tightness with bilateral leg swelling onset 2 days ago with significant worsening today.  Patient reports that she saw her primary care physician on 10/08/2014 and her medications were changed. She reports she stopped taking her HCTZ and her verapamil. Instead she was given a combination HCTZ-beta blocker.  She reports that when she saw her primary care this morning with complaints of shortness of breath and leg swelling she was found to be bradycardic and sent here to the emergency department. Patient's family reports 5 pound weight gain in the last 2 days. Patient reports associated right flank pain. She also reports dyspnea on exertion but denies orthopnea. She denies chest pain exertional or at rest. She reports generalized fatigue. No other alleviating factors. Patient denies fever, chills, headache, neck pain, chest pain, cough, abdominal pain, nausea, vomiting, diarrhea, weakness, dizziness, syncope.   Past Medical History  Diagnosis Date  . Vitamin D deficiency   . Nodule of chest wall   . Heel pain     left foot  . Hypercholesteremia   . HTN (hypertension)   . Type II or unspecified type diabetes mellitus without mention of complication, not stated as uncontrolled   . DJD (degenerative joint disease)   . Fatty liver    Past  Surgical History  Procedure Laterality Date  . Tonsillectomy and adenoidectomy    . Total knee arthroplasty    . Breast surgery    . Cataract extraction    . Cardiac catheterization     Family History  Problem Relation Age of Onset  . Hypertension    . Cancer    . Diabetes    . Heart disease     History  Substance Use Topics  . Smoking status: Former Smoker    Quit date: 12/06/1963  . Smokeless tobacco: Not on file  . Alcohol Use: No   OB History    No data available     Review of Systems  Constitutional: Positive for fatigue. Negative for fever, diaphoresis, appetite change and unexpected weight change.  HENT: Negative for mouth sores.   Eyes: Negative for visual disturbance.  Respiratory: Positive for shortness of breath. Negative for cough, chest tightness and wheezing.   Cardiovascular: Positive for leg swelling. Negative for chest pain.  Gastrointestinal: Negative for nausea, vomiting, abdominal pain, diarrhea and constipation.  Endocrine: Negative for polydipsia, polyphagia and polyuria.  Genitourinary: Negative for dysuria, urgency, frequency and hematuria.  Musculoskeletal: Negative for back pain and neck stiffness.  Skin: Negative for rash.  Allergic/Immunologic: Negative for immunocompromised state.  Neurological: Negative for syncope, light-headedness and headaches.  Hematological: Does not bruise/bleed easily.  Psychiatric/Behavioral: Negative for sleep disturbance. The patient is not nervous/anxious.       Allergies  Penicillins; Calcium-containing compounds; Celebrex; Daypro; and Feldene  Home Medications   Prior to Admission medications   Medication Sig Start Date End  Date Taking? Authorizing Provider  Acetaminophen (ARTHRITIS PAIN RELIEVER PO) Take by mouth as needed.   Yes Historical Provider, MD  aspirin 81 MG tablet Take 81 mg by mouth daily.   Yes Historical Provider, MD  Cetirizine HCl (ZYRTEC ALLERGY PO) Take 1 tablet by mouth daily. 11/28/12   Yes Historical Provider, MD  Cholecalciferol (VITAMIN D) 2000 UNITS CAPS Take 2 capsules by mouth daily.   Yes Historical Provider, MD  FREESTYLE LITE test strip USE ONE STRIP TO CHECK GLUCOSE ONCE DAILY 03/06/14  Yes Quentin Mulling, PA-C  Lancets (FREESTYLE) lancets USE ONE LANCET TO CHECK GLUCOSE EVERY DAY 08/05/14  Yes Quentin Mulling, PA-C  Magnesium 500 MG TABS Take 500 mg by mouth daily.   Yes Historical Provider, MD  metFORMIN (GLUCOPHAGE XR) 500 MG 24 hr tablet Take 2 tablets twice daily with meals for Diabetes 04/04/14 04/05/15 Yes Lucky Cowboy, MD  omeprazole (PRILOSEC) 20 MG capsule Take 20 mg by mouth daily.   Yes Historical Provider, MD  potassium chloride SA (K-DUR,KLOR-CON) 20 MEQ tablet Take 1 tablet (20 mEq total) by mouth daily. 07/05/14  Yes Lucky Cowboy, MD  pravastatin (PRAVACHOL) 40 MG tablet TAKE ONE TABLET BY MOUTH ONCE DAILY AT BEDTIME FOR CHOLESTEROL 08/19/14  Yes Lucky Cowboy, MD  quinapril (ACCUPRIL) 20 MG tablet Take 1 tablet (20 mg total) by mouth 2 (two) times daily. 07/05/14  Yes Lucky Cowboy, MD  amLODipine (NORVASC) 5 MG tablet Take 1 tablet (5 mg total) by mouth daily. 10/21/14   Quentin Mulling, PA-C  furosemide (LASIX) 40 MG tablet Take 1 tablet (40 mg total) by mouth daily. 10/21/14   Quentin Mulling, PA-C  glucose blood (FREESTYLE LITE) test strip ONE STRIP TO CHECK GLUCOSE ONCE DAILY DX E10.9 08/05/14   Quentin Mulling, PA-C   BP 154/61 mmHg  Pulse 37  Temp(Src) 98.5 F (36.9 C) (Oral)  Resp 20  SpO2 97% Physical Exam  Constitutional: She appears well-developed and well-nourished. No distress.  Awake, alert, nontoxic appearance Patient appears dyspneic with exertion, walking to the bathroom but otherwise is in no distress at rest  HENT:  Head: Normocephalic and atraumatic.  Mouth/Throat: Oropharynx is clear and moist. No oropharyngeal exudate.  Eyes: Conjunctivae are normal. No scleral icterus.  Neck: Normal range of motion. Neck supple.   Cardiovascular: Normal rate, regular rhythm and intact distal pulses.   Pulmonary/Chest: Effort normal and breath sounds normal. No respiratory distress. She has no wheezes. She exhibits no tenderness.  Equal chest expansion Clear and equal breath sounds without rales on the bases  Abdominal: Soft. Bowel sounds are normal. She exhibits no mass. There is no tenderness. There is no rebound and no guarding.  Abdomen soft and nontender No CVA tenderness  Musculoskeletal: Normal range of motion. She exhibits edema.  2+ pitting edema of the bilateral feet with swelling noted from the distal forefoot to just below the bilateral knees Surgical incisions from bilateral arthroplasty.  Neurological: She is alert.  Speech is clear and goal oriented Moves extremities without ataxia  Skin: Skin is warm and dry. She is not diaphoretic.  Psychiatric: She has a normal mood and affect.  Nursing note and vitals reviewed.   ED Course  Procedures (including critical care time) Labs Review Labs Reviewed  BRAIN NATRIURETIC PEPTIDE - Abnormal; Notable for the following:    B Natriuretic Peptide 649.3 (*)    All other components within normal limits  BASIC METABOLIC PANEL - Abnormal; Notable for the following:    Glucose,  Bld 143 (*)    GFR calc non Af Amer 49 (*)    GFR calc Af Amer 57 (*)    All other components within normal limits  CBC WITH DIFFERENTIAL/PLATELET - Abnormal; Notable for the following:    Hemoglobin 11.5 (*)    HCT 34.0 (*)    All other components within normal limits  HEPARIN LEVEL (UNFRACTIONATED)  CBC  I-STAT TROPOININ, ED    Imaging Review Dg Chest 2 View  10/21/2014   CLINICAL DATA:  Shortness of breath.  Chest tightness since Friday.  EXAM: CHEST  2 VIEW  COMPARISON:  01/29/2013  FINDINGS: Stable mild enlargement of the cardiopericardial silhouette with tortuous thoracic aorta. Mildly indistinct pulmonary vasculature with linear densities at the lung bases suggesting  subsegmental atelectasis or mild interstitial accentuation which is new compared to 01/29/13.  No pleural effusion. Thoracic spondylosis. Degenerative AC joint arthropathy, right greater than left.  IMPRESSION: 1. Cardiomegaly without overt edema. Faint linear opacities at the lung bases may reflect subsegmental atelectasis or mild nonspecific reticulation, but no definite Kerley B-lines are observed two specifically indicate interstitial pulmonary edema.   Electronically Signed   By: Gaylyn RongWalter  Liebkemann M.D.   On: 10/21/2014 18:05     EKG Interpretation   Date/Time:  Monday October 21 2014 17:24:44 EST Ventricular Rate:  41 PR Interval:  146 QRS Duration: 84 QT Interval:  510 QTC Calculation: 420 R Axis:   -29 Text Interpretation:  Marked sinus bradycardia Nonspecific ST and T wave  abnormality Abnormal ECG When compared with ECG of 12/27/2011, HEART RATE  has decreased by 18 bpm Confirmed by Preston FleetingGLICK  MD, DAVID ([id number]) on 10/21/2014  5:37:40 PM   Date: 10/21/2014 19:36:29 Rate: 55 Rhythm: atrial flutter QRS Axis: normal Intervals: normal ST/T Wave abnormalities: nonspecific ST/T changes Conduction Disutrbances:none Narrative Interpretation: Atrial flutter with slow ventricular response. When compared with ECG of earlier today, atrial flutter has replaced sinus bradycardia. With the change in rhythm, heart rate has asked she increased by 14 bpm. Old EKG Reviewed: changes noted   MDM   Final diagnoses:  Atrial flutter, unspecified  Bradycardia  Shortness of breath  Dyspnea on exertion  Weight gain  Peripheral edema   Marvene StaffMary C Wendling presents with SOB, chest pressure and DOE. Concern for ACS vs new onset CHF.  Pt denies CP.  She is bradycardic and this is likely to be from her new beta blocker.  Bilateral. Highly doubt DVT.  7:47 PM Pt with short run of tachycardia and now with rhythm change to a-flutter confirmed by ECG.  Pt will be started on heparin and admitted.  Also  believe pt has new onset CHF with an elevated BNP, peripheral edema.  Chest x-ray without pulmonary edema. No leukocytosis or renal failure. Troponin negative.  The patient was discussed with and seen by Dr. Preston FleetingGlick who agrees with the treatment plan.  9:05 PM Patient discussed with Dr. Toniann FailKakrakandy who will admit to stepdown.   Dahlia ClientHannah Norvil Martensen, PA-C 10/22/14 0134  Dione Boozeavid Glick, MD 10/23/14 [phone]

## 2014-10-22 DIAGNOSIS — I071 Rheumatic tricuspid insufficiency: Secondary | ICD-10-CM | POA: Diagnosis present

## 2014-10-22 DIAGNOSIS — Z833 Family history of diabetes mellitus: Secondary | ICD-10-CM | POA: Diagnosis not present

## 2014-10-22 DIAGNOSIS — I10 Essential (primary) hypertension: Secondary | ICD-10-CM | POA: Insufficient documentation

## 2014-10-22 DIAGNOSIS — E119 Type 2 diabetes mellitus without complications: Secondary | ICD-10-CM | POA: Diagnosis present

## 2014-10-22 DIAGNOSIS — I249 Acute ischemic heart disease, unspecified: Secondary | ICD-10-CM | POA: Diagnosis present

## 2014-10-22 DIAGNOSIS — R0602 Shortness of breath: Secondary | ICD-10-CM | POA: Diagnosis present

## 2014-10-22 DIAGNOSIS — Z888 Allergy status to other drugs, medicaments and biological substances status: Secondary | ICD-10-CM | POA: Diagnosis not present

## 2014-10-22 DIAGNOSIS — I4892 Unspecified atrial flutter: Secondary | ICD-10-CM | POA: Diagnosis not present

## 2014-10-22 DIAGNOSIS — Z96659 Presence of unspecified artificial knee joint: Secondary | ICD-10-CM | POA: Diagnosis present

## 2014-10-22 DIAGNOSIS — Z87891 Personal history of nicotine dependence: Secondary | ICD-10-CM | POA: Diagnosis not present

## 2014-10-22 DIAGNOSIS — I509 Heart failure, unspecified: Secondary | ICD-10-CM

## 2014-10-22 DIAGNOSIS — M1990 Unspecified osteoarthritis, unspecified site: Secondary | ICD-10-CM | POA: Diagnosis present

## 2014-10-22 DIAGNOSIS — E785 Hyperlipidemia, unspecified: Secondary | ICD-10-CM | POA: Diagnosis present

## 2014-10-22 DIAGNOSIS — M47814 Spondylosis without myelopathy or radiculopathy, thoracic region: Secondary | ICD-10-CM | POA: Diagnosis present

## 2014-10-22 DIAGNOSIS — Z8249 Family history of ischemic heart disease and other diseases of the circulatory system: Secondary | ICD-10-CM | POA: Diagnosis not present

## 2014-10-22 DIAGNOSIS — R001 Bradycardia, unspecified: Secondary | ICD-10-CM | POA: Diagnosis present

## 2014-10-22 DIAGNOSIS — Z7982 Long term (current) use of aspirin: Secondary | ICD-10-CM | POA: Diagnosis not present

## 2014-10-22 DIAGNOSIS — R7989 Other specified abnormal findings of blood chemistry: Secondary | ICD-10-CM | POA: Diagnosis not present

## 2014-10-22 DIAGNOSIS — I480 Paroxysmal atrial fibrillation: Secondary | ICD-10-CM | POA: Diagnosis present

## 2014-10-22 DIAGNOSIS — I5033 Acute on chronic diastolic (congestive) heart failure: Secondary | ICD-10-CM | POA: Diagnosis present

## 2014-10-22 DIAGNOSIS — I4510 Unspecified right bundle-branch block: Secondary | ICD-10-CM | POA: Diagnosis present

## 2014-10-22 DIAGNOSIS — Z79899 Other long term (current) drug therapy: Secondary | ICD-10-CM | POA: Diagnosis not present

## 2014-10-22 DIAGNOSIS — E559 Vitamin D deficiency, unspecified: Secondary | ICD-10-CM | POA: Diagnosis present

## 2014-10-22 DIAGNOSIS — Z880 Allergy status to penicillin: Secondary | ICD-10-CM | POA: Diagnosis not present

## 2014-10-22 DIAGNOSIS — I214 Non-ST elevation (NSTEMI) myocardial infarction: Secondary | ICD-10-CM | POA: Diagnosis present

## 2014-10-22 DIAGNOSIS — I272 Other secondary pulmonary hypertension: Secondary | ICD-10-CM | POA: Diagnosis present

## 2014-10-22 LAB — COMPREHENSIVE METABOLIC PANEL
ALK PHOS: 48 U/L (ref 39–117)
ALT: 53 U/L — ABNORMAL HIGH (ref 0–35)
AST: 41 U/L — AB (ref 0–37)
Albumin: 3.4 g/dL — ABNORMAL LOW (ref 3.5–5.2)
Anion gap: 7 (ref 5–15)
BUN: 18 mg/dL (ref 6–23)
CALCIUM: 9 mg/dL (ref 8.4–10.5)
CO2: 28 mmol/L (ref 19–32)
CREATININE: 0.93 mg/dL (ref 0.50–1.10)
Chloride: 105 mmol/L (ref 96–112)
GFR calc Af Amer: 67 mL/min — ABNORMAL LOW (ref >90)
GFR calc non Af Amer: 58 mL/min — ABNORMAL LOW (ref >90)
Glucose, Bld: 112 mg/dL — ABNORMAL HIGH (ref 70–99)
Potassium: 3.7 mmol/L (ref 3.5–5.1)
Sodium: 140 mmol/L (ref 135–145)
TOTAL PROTEIN: 5.9 g/dL — AB (ref 6.0–8.3)
Total Bilirubin: 0.7 mg/dL (ref 0.3–1.2)

## 2014-10-22 LAB — HEPARIN LEVEL (UNFRACTIONATED)
HEPARIN UNFRACTIONATED: 0.27 [IU]/mL — AB (ref 0.30–0.70)
Heparin Unfractionated: 0.4 IU/mL (ref 0.30–0.70)

## 2014-10-22 LAB — TROPONIN I
TROPONIN I: 0.22 ng/mL — AB (ref <0.031)
TROPONIN I: 1.49 ng/mL — AB (ref <0.031)
Troponin I: 0.27 ng/mL — ABNORMAL HIGH (ref <0.031)

## 2014-10-22 LAB — CBC WITH DIFFERENTIAL/PLATELET
BASOS ABS: 0.1 10*3/uL (ref 0.0–0.1)
Basophils Relative: 1 % (ref 0–1)
EOS PCT: 1 % (ref 0–5)
Eosinophils Absolute: 0.1 10*3/uL (ref 0.0–0.7)
HCT: 33.6 % — ABNORMAL LOW (ref 36.0–46.0)
Hemoglobin: 11 g/dL — ABNORMAL LOW (ref 12.0–15.0)
LYMPHS PCT: 25 % (ref 12–46)
Lymphs Abs: 1.6 10*3/uL (ref 0.7–4.0)
MCH: 27.9 pg (ref 26.0–34.0)
MCHC: 32.7 g/dL (ref 30.0–36.0)
MCV: 85.3 fL (ref 78.0–100.0)
Monocytes Absolute: 0.9 10*3/uL (ref 0.1–1.0)
Monocytes Relative: 14 % — ABNORMAL HIGH (ref 3–12)
NEUTROS ABS: 3.9 10*3/uL (ref 1.7–7.7)
NEUTROS PCT: 59 % (ref 43–77)
PLATELETS: 225 10*3/uL (ref 150–400)
RBC: 3.94 MIL/uL (ref 3.87–5.11)
RDW: 15.4 % (ref 11.5–15.5)
WBC: 6.5 10*3/uL (ref 4.0–10.5)

## 2014-10-22 LAB — GLUCOSE, CAPILLARY
Glucose-Capillary: 121 mg/dL — ABNORMAL HIGH (ref 70–99)
Glucose-Capillary: 118 mg/dL — ABNORMAL HIGH (ref 70–99)
Glucose-Capillary: 189 mg/dL — ABNORMAL HIGH (ref 70–99)
Glucose-Capillary: 160 mg/dL — ABNORMAL HIGH (ref 70–99)

## 2014-10-22 LAB — TSH: TSH: 3.68 u[IU]/mL (ref 0.350–4.500)

## 2014-10-22 LAB — MAGNESIUM: Magnesium: 1.9 mg/dL (ref 1.5–2.5)

## 2014-10-22 MED ORDER — ASPIRIN EC 81 MG PO TBEC
81.0000 mg | DELAYED_RELEASE_TABLET | Freq: Every day | ORAL | Status: DC
  Administered 2014-10-23 – 2014-10-25 (×3): 81 mg via ORAL
  Filled 2014-10-22 (×3): qty 1

## 2014-10-22 NOTE — Progress Notes (Signed)
ANTICOAGULATION CONSULT NOTE  Pharmacy Consult for heparin Indication: Aflutter  Allergies  Allergen Reactions  . Penicillins Anaphylaxis  . Calcium-Containing Compounds     "calcium deposits on skin"  . Celebrex [Celecoxib] Itching  . Daypro [Oxaprozin] Itching  . Feldene [Piroxicam] Other (See Comments)    Bleeding     Patient Measurements: TBW - 88kg IBW ~45.5 kg Heparin Dosing weight - 66.2 kg  Vital Signs: Temp: 98.4 F (36.9 C) (02/23 1114) Temp Source: Oral (02/23 1114) BP: 154/56 mmHg (02/23 1114) Pulse Rate: 36 (02/23 0735)  Labs:  Recent Labs  10/21/14 1729 10/21/14 2316 10/22/14 0429 10/22/14 1030 10/22/14 1335  HGB 11.5*  --  11.0*  --   --   HCT 34.0*  --  33.6*  --   --   PLT 223  --  225  --   --   HEPARINUNFRC  --   --  0.27*  --  0.40  CREATININE 1.07  --  0.93  --   --   TROPONINI  --  0.22* 0.27* 1.49*  --     Estimated Creatinine Clearance: 47.7 mL/min (by C-G formula based on Cr of 0.93).  Assessment: 80 y.o. female with NSTEMI and aflutter (CHADSVASC= 6) on heparin and heparin level is at goal after increase to 1150 units/hr. Noted plans for possible cath.  Goal of Therapy:  Heparin level 0.3-0.7 units/ml Monitor platelets by anticoagulation protocol: Yes   Plan:  -No heparin changed needed -Will follow plans for cath and oral anticoagulation -Heparin level and CBC daily  Tracey Todd, Pharm D 10/22/2014 3:08 PM

## 2014-10-22 NOTE — Progress Notes (Signed)
ANTICOAGULATION CONSULT NOTE  Pharmacy Consult for heparin Indication: Aflutter  Allergies  Allergen Reactions  . Penicillins Anaphylaxis  . Calcium-Containing Compounds     "calcium deposits on skin"  . Celebrex [Celecoxib] Itching  . Daypro [Oxaprozin] Itching  . Feldene [Piroxicam] Other (See Comments)    Bleeding     Patient Measurements: TBW - 88kg IBW ~45.5 kg Heparin Dosing weight - 66.2 kg  Vital Signs: Temp: 97.9 F (36.6 C) (02/23 0312) Temp Source: Oral (02/23 0312) BP: 110/64 mmHg (02/23 0623) Pulse Rate: 36 (02/23 0623)  Labs:  Recent Labs  10/21/14 1729 10/21/14 2316 10/22/14 0429  HGB 11.5*  --  11.0*  HCT 34.0*  --  33.6*  PLT 223  --  225  HEPARINUNFRC  --   --  0.27*  CREATININE 1.07  --   --   TROPONINI  --  0.22*  --     Estimated Creatinine Clearance: 41.5 mL/min (by C-G formula based on Cr of 1.07).  Assessment: 80 y.o. female with Aflutter for heparin   Goal of Therapy:  Heparin level 0.3-0.7 units/ml Monitor platelets by anticoagulation protocol: Yes   Plan:  Increase Heparin 1150 units/hr Check heparin level in 8 hours.  Tallie Hevia, Gary FleetGregory Vernon 10/22/2014,6:28 AM

## 2014-10-22 NOTE — Progress Notes (Signed)
CRITICAL VALUE ALERT  Critical value received: troponin 1.49  Date of notification:  09/21/2014  Time of notification:  1225  Critical value read back:Yes.    Nurse who received alert:  Raymon MuttonWhitney Davis  MD notified (1st page): Abelino DerrickLuke K Kilroy, PA-C  Time of first page:  1225  MD notified (2nd page):  Time of second page:  Responding MD:  Abelino DerrickLuke K Kilroy, PA-C  Time MD responded: [phone]

## 2014-10-22 NOTE — Progress Notes (Signed)
Echocardiogram 2D Echocardiogram has been performed.  Tracey Todd 10/22/2014, 11:11 AM

## 2014-10-22 NOTE — Progress Notes (Signed)
PROGRESS NOTE  Tracey StaffMary C Yusuf AOZ:[id number] DOB: 03/09/1935 DOA: 10/21/2014 PCP: Nadean CorwinMCKEOWN,WILLIAM Jeromy Borcherding, MD  Brief history 80 year old female with a history of hypertension, diabetes mellitus, hyperlipidemia presents with 2 week history of worsening exertional chest discomfort and dyspnea as well as increasing fatigue. On 10/08/2014 the patient's antihypertensive regimen was changed from verapamil and HCTZ (25mg ) to bisoprolol/HCTZ (6.25mg ). The patient went to see her primary care provider on the day of admission. EKG in the office showed bradycardia with heart rate of 36. The patient was advised them to the emergency department. Further evaluation in emergency department revealed that the patient was fluid overloaded. She was found to have atrial flutter with a slow ventricular response. The patient was started on intravenous furosemide and heparin drip. Cardiology was consulted. Assessment/Plan: Acute on chronic diastolic CHF -Patient remains clinically fluid overloaded -Continue IV furosemide -Daily weights -Repeat echocardiogram -Beta blockers held due to bradycardia Bradycardia/atrial flutter with slow ventricular response -Cardiology was consulted -Continue heparin drip  -further intervention per cardiology Chest discomfort with elevated troponin  -Continue to cycle troponins  -may be related to the patient's CHF exacerbation but cannot rule out NSTEMI in the setting of exertional chest discomfort and shortness of breath  Hypertension  -Continue amlodipine and lisinopril  Diabetes mellitus type 2  -10/08/2014 hemoglobin A1c 6.9  -Hold metformin  -NovoLog sliding scale while the patient is in the hospital  Hyperlipidemia  -Continue statin   Family Communication:   Pt at beside Disposition Plan:   Home when medically stable     Procedures/Studies: Dg Chest 2 View  10/21/2014   CLINICAL DATA:  Shortness of breath.  Chest tightness since Friday.  EXAM: CHEST  2  VIEW  COMPARISON:  01/29/2013  FINDINGS: Stable mild enlargement of the cardiopericardial silhouette with tortuous thoracic aorta. Mildly indistinct pulmonary vasculature with linear densities at the lung bases suggesting subsegmental atelectasis or mild interstitial accentuation which is new compared to 01/29/13.  No pleural effusion. Thoracic spondylosis. Degenerative AC joint arthropathy, right greater than left.  IMPRESSION: 1. Cardiomegaly without overt edema. Faint linear opacities at the lung bases may reflect subsegmental atelectasis or mild nonspecific reticulation, but no definite Kerley B-lines are observed two specifically indicate interstitial pulmonary edema.   Electronically Signed   By: Gaylyn RongWalter  Liebkemann M.D.   On: 10/21/2014 18:05         Subjective:  patient is breathing better but continues to have some chest discomfort. Denies any fevers, chills, coughing, hemoptysis, nausea, vomiting, diarrhea, vomiting. No dysuria.   Objective: Filed Vitals:   10/22/14 0420 10/22/14 0500 10/22/14 0623 10/22/14 0735  BP:  120/52 110/64   Pulse:  36 36   Temp:    98.4 F (36.9 C)  TempSrc:    Oral  Resp:  17 17   Height:      Weight: 86.1 kg (189 lb 13.1 oz)     SpO2:  95% 95%     Intake/Output Summary (Last 24 hours) at 10/22/14 0741 Last data filed at 10/22/14 0600  Gross per 24 hour  Intake    320 ml  Output   2975 ml  Net  -2655 ml   Weight change:  Exam:   General:  Pt is alert, follows commands appropriately, not in acute distress  HEENT: No icterus, No thrush,  [place]/AT  Cardiovascular: irregular, S1/S2, no rubs, no gallops+ JVD  Respiratory: bibasilar crackles. No wheeze.   Abdomen: Soft/+BS, non tender,  non distended, no guarding  Extremities: 1+LEedema, No lymphangitis, No petechiae, No rashes, no synovitis  Data Reviewed: Basic Metabolic Panel:  Recent Labs Lab 10/21/14 1729 10/21/14 2316 10/22/14 0429  NA 137  --  140  K 4.8  --  3.7  CL 105  --   105  CO2 25  --  28  GLUCOSE 143*  --  112*  BUN 21  --  18  CREATININE 1.07  --  0.93  CALCIUM 9.3  --  9.0  MG  --  1.9  --    Liver Function Tests:  Recent Labs Lab 10/22/14 0429  AST 41*  ALT 53*  ALKPHOS 48  BILITOT 0.7  PROT 5.9*  ALBUMIN 3.4*   No results for input(s): LIPASE, AMYLASE in the last 168 hours. No results for input(s): AMMONIA in the last 168 hours. CBC:  Recent Labs Lab 10/21/14 1729 10/22/14 0429  WBC 8.2 6.5  NEUTROABS 4.6 3.9  HGB 11.5* 11.0*  HCT 34.0* 33.6*  MCV 83.7 85.3  PLT 223 225   Cardiac Enzymes:  Recent Labs Lab 10/21/14 2316 10/22/14 0429  TROPONINI 0.22* 0.27*   BNP: Invalid input(s): POCBNP CBG:  Recent Labs Lab 10/21/14 1842  GLUCAP 112*    Recent Results (from the past 240 hour(s))  MRSA PCR Screening     Status: None   Collection Time: 10/21/14 10:22 PM  Result Value Ref Range Status   MRSA by PCR NEGATIVE NEGATIVE Final    Comment:        The GeneXpert MRSA Assay (FDA approved for NASAL specimens only), is one component of a comprehensive MRSA colonization surveillance program. It is not intended to diagnose MRSA infection nor to guide or monitor treatment for MRSA infections.      Scheduled Meds: . amLODipine  5 mg Oral Daily  . aspirin EC  325 mg Oral Daily  . furosemide  40 mg Intravenous Q12H  . insulin aspart  0-9 Units Subcutaneous TID WC  . lisinopril  20 mg Oral BID  . pantoprazole  40 mg Oral Daily  . potassium chloride SA  20 mEq Oral Daily  . pravastatin  40 mg Oral q1800  . sodium chloride  3 mL Intravenous Q12H   Continuous Infusions: . heparin 1,150 Units/hr (10/22/14 0636)     Eda Magnussen, DO  [facility] Pager [phone]  If 7PM-7AM, please contact night-coverage [email or web address] Password [facility] 10/22/2014, 7:41 AM   LOS: 1 day

## 2014-10-22 NOTE — Progress Notes (Signed)
    Subjective:  SOB improved.  Objective:  Vital Signs in the last 24 hours: Temp:  [97.9 F (36.6 C)-98.9 F (37.2 C)] 98.4 F (36.9 C) (02/23 0735) Pulse Rate:  [36-54] 36 (02/23 0735) Resp:  [14-23] 20 (02/23 0735) BP: (107-196)/(48-73) 154/56 mmHg (02/23 1114) SpO2:  [94 %-99 %] 98 % (02/23 0735) Weight:  [189 lb 13.1 oz (86.1 kg)-195 lb 5.2 oz (88.6 kg)] 189 lb 13.1 oz (86.1 kg) (02/23 0420)  Intake/Output from previous day:  Intake/Output Summary (Last 24 hours) at 10/22/14 1200 Last data filed at 10/22/14 0804  Gross per 24 hour  Intake    343 ml  Output   3275 ml  Net  -2932 ml    Physical Exam: General appearance: alert, cooperative, no distress and moderately obese Lungs: clear to auscultation bilaterally Heart: regular rate and rhythm and slow rythm Extremities: no edema   Rate: 40  Rhythm: sinus bradycardia  Lab Results:  Recent Labs  10/21/14 1729 10/22/14 0429  WBC 8.2 6.5  HGB 11.5* 11.0*  PLT 223 225    Recent Labs  10/21/14 1729 10/22/14 0429  NA 137 140  K 4.8 3.7  CL 105 105  CO2 25 28  GLUCOSE 143* 112*  BUN 21 18  CREATININE 1.07 0.93    Recent Labs  10/21/14 2316 10/22/14 0429  TROPONINI 0.22* 0.27*   No results for input(s): INR in the last 72 hours.  Imaging: Imaging results have been reviewed  Cardiac Studies:  Assessment/Plan:  58F with HTN, HLD, DM, and arthritis who p/w new onset HF, bradycardia, and elevated Troponin.    Principal Problem:   Bradycardia Active Problems:   Acute on chronic diastolic CHF (congestive heart failure)   Elevated troponin   HTN (hypertension)   Diabetes mellitus type 2, controlled   PAF-fib and flutter   Atrial flutter   PLAN: She is improved after 3L diuresis. HR still in 40s- NSR. Suspect CHF from bradycardia. Pt did give me a history of exertional angina in recent weeks. She is NPO. Will review with MD- ? Consider diagnostic cath today. Continue to observe off beta  blocker. Echo ordered.   Corine ShelterLuke Kilroy PA-C Beeper [phone] 10/22/2014, 12:00 PM . I agree with the assessment and plan as detailed above. See also my additional thoughts below.   We will follow labs, diuresis, and heart rate today. Decide further plans in AM.  Willa RoughJeffrey Yaphet Smethurst, MD, [facility] 10/22/2014 12:31 PM

## 2014-10-22 NOTE — Plan of Care (Signed)
Problem: Consults Goal: Skin Care Protocol Initiated - if Braden Score 18 or less If consults are not indicated, leave blank or document N/A Outcome: Not Applicable Date Met:  11/09/79 Pt does not meet criteria at this time.  Problem: Phase I Progression Outcomes Goal: Pain controlled with appropriate interventions Outcome: Completed/Met Date Met:  10/22/14 Pain has no complaints of pain. Goal: Voiding-avoid urinary catheter unless indicated Outcome: Completed/Met Date Met:  10/22/14 Patient using BSC to void.

## 2014-10-22 NOTE — Progress Notes (Signed)
UR completed 

## 2014-10-23 ENCOUNTER — Encounter (HOSPITAL_COMMUNITY)
Admission: EM | Disposition: A | Discharge status: Home / Self Care | Payer: Self-pay | Source: Home / Self Care | Attending: Internal Medicine

## 2014-10-23 DIAGNOSIS — I5033 Acute on chronic diastolic (congestive) heart failure: Secondary | ICD-10-CM

## 2014-10-23 DIAGNOSIS — R7989 Other specified abnormal findings of blood chemistry: Secondary | ICD-10-CM

## 2014-10-23 HISTORY — PX: LEFT AND RIGHT HEART CATHETERIZATION WITH CORONARY ANGIOGRAM: SHX5449

## 2014-10-23 LAB — POCT I-STAT 3, VENOUS BLOOD GAS (G3P V)
BICARBONATE: 25.3 meq/L — AB (ref 20.0–24.0)
O2 SAT: 76 %
PO2 VEN: 42 mmHg (ref 30.0–45.0)
TCO2: 27 mmol/L (ref 0–100)
pCO2, Ven: 42.5 mmHg — ABNORMAL LOW (ref 45.0–50.0)
pH, Ven: 7.383 — ABNORMAL HIGH (ref 7.250–7.300)

## 2014-10-23 LAB — CBC
HCT: 37.9 % (ref 36.0–46.0)
HEMATOCRIT: 36.4 % (ref 36.0–46.0)
HEMOGLOBIN: 12 g/dL (ref 12.0–15.0)
HEMOGLOBIN: 12.7 g/dL (ref 12.0–15.0)
MCH: 27.6 pg (ref 26.0–34.0)
MCH: 28.2 pg (ref 26.0–34.0)
MCHC: 33 g/dL (ref 30.0–36.0)
MCHC: 33.5 g/dL (ref 30.0–36.0)
MCV: 83.9 fL (ref 78.0–100.0)
MCV: 84 fL (ref 78.0–100.0)
PLATELETS: 261 10*3/uL (ref 150–400)
Platelets: 240 10*3/uL (ref 150–400)
RBC: 4.34 MIL/uL (ref 3.87–5.11)
RBC: 4.51 MIL/uL (ref 3.87–5.11)
RDW: 15.5 % (ref 11.5–15.5)
RDW: 15.5 % (ref 11.5–15.5)
WBC: 6.9 10*3/uL (ref 4.0–10.5)
WBC: 6.7 10*3/uL (ref 4.0–10.5)

## 2014-10-23 LAB — POCT I-STAT 3, ART BLOOD GAS (G3+)
Bicarbonate: 25.2 mEq/L — ABNORMAL HIGH (ref 20.0–24.0)
O2 Saturation: 97 %
TCO2: 27 mmol/L (ref 0–100)
pCO2 arterial: 43.6 mmHg (ref 35.0–45.0)
pH, Arterial: 7.371 (ref 7.350–7.450)
pO2, Arterial: 97 mmHg (ref 80.0–100.0)

## 2014-10-23 LAB — GLUCOSE, CAPILLARY
GLUCOSE-CAPILLARY: 137 mg/dL — AB (ref 70–99)
GLUCOSE-CAPILLARY: 130 mg/dL — AB (ref 70–99)
GLUCOSE-CAPILLARY: 114 mg/dL — AB (ref 70–99)
Glucose-Capillary: 214 mg/dL — ABNORMAL HIGH (ref 70–99)

## 2014-10-23 LAB — POCT ACTIVATED CLOTTING TIME: Activated Clotting Time: 110 seconds

## 2014-10-23 LAB — BASIC METABOLIC PANEL
Anion gap: 12 (ref 5–15)
BUN: 14 mg/dL (ref 6–23)
CO2: 29 mmol/L (ref 19–32)
CREATININE: 1.05 mg/dL (ref 0.50–1.10)
Calcium: 9.3 mg/dL (ref 8.4–10.5)
Chloride: 98 mmol/L (ref 96–112)
GFR, EST AFRICAN AMERICAN: 58 mL/min — AB (ref >90)
GFR, EST NON AFRICAN AMERICAN: 50 mL/min — AB (ref >90)
Glucose, Bld: 149 mg/dL — ABNORMAL HIGH (ref 70–99)
Potassium: 4.2 mmol/L (ref 3.5–5.1)
Sodium: 139 mmol/L (ref 135–145)

## 2014-10-23 LAB — PROTIME-INR
INR: 1.1 (ref 0.00–1.49)
PROTHROMBIN TIME: 14.3 s (ref 11.6–15.2)

## 2014-10-23 LAB — CREATININE, SERUM
CREATININE: 0.88 mg/dL (ref 0.50–1.10)
GFR calc Af Amer: 72 mL/min — ABNORMAL LOW (ref >90)
GFR calc non Af Amer: 62 mL/min — ABNORMAL LOW (ref >90)

## 2014-10-23 LAB — HEPARIN LEVEL (UNFRACTIONATED): Heparin Unfractionated: 0.47 IU/mL (ref 0.30–0.70)

## 2014-10-23 SURGERY — LEFT AND RIGHT HEART CATHETERIZATION WITH CORONARY ANGIOGRAM

## 2014-10-23 MED ORDER — SODIUM CHLORIDE 0.9 % IV SOLN
INTRAVENOUS | Status: AC
  Administered 2014-10-23: 20:00:00 via INTRAVENOUS

## 2014-10-23 MED ORDER — FENTANYL CITRATE 0.05 MG/ML IJ SOLN
INTRAMUSCULAR | Status: AC
  Filled 2014-10-23: qty 2

## 2014-10-23 MED ORDER — NITROGLYCERIN 1 MG/10 ML FOR IR/CATH LAB
INTRA_ARTERIAL | Status: AC
  Filled 2014-10-23: qty 10

## 2014-10-23 MED ORDER — MIDAZOLAM HCL 2 MG/2ML IJ SOLN
INTRAMUSCULAR | Status: AC
  Filled 2014-10-23: qty 2

## 2014-10-23 MED ORDER — SODIUM CHLORIDE 0.9 % IJ SOLN: 3.0000 mL | Freq: Two times a day (BID) | INTRAMUSCULAR | Status: DC

## 2014-10-23 MED ORDER — HEPARIN SODIUM (PORCINE) 5000 UNIT/ML IJ SOLN
5000.0000 [IU] | Freq: Three times a day (TID) | INTRAMUSCULAR | Status: DC
  Administered 2014-10-24 – 2014-10-25 (×4): 5000 [IU] via SUBCUTANEOUS
  Filled 2014-10-23 (×7): qty 1

## 2014-10-23 MED ORDER — HEPARIN (PORCINE) IN NACL 2-0.9 UNIT/ML-% IJ SOLN
INTRAMUSCULAR | Status: AC
  Filled 2014-10-23: qty 1000

## 2014-10-23 MED ORDER — SODIUM CHLORIDE 0.9 % IV SOLN: Freq: Once | INTRAVENOUS | Status: DC

## 2014-10-23 MED ORDER — SODIUM CHLORIDE 0.9 % IV SOLN: 250.0000 mL | INTRAVENOUS | Status: DC | PRN

## 2014-10-23 MED ORDER — LIDOCAINE HCL (PF) 1 % IJ SOLN
INTRAMUSCULAR | Status: AC
  Filled 2014-10-23: qty 30

## 2014-10-23 MED ORDER — SODIUM CHLORIDE 0.9 % IJ SOLN: 3.0000 mL | INTRAMUSCULAR | Status: DC | PRN

## 2014-10-23 NOTE — Progress Notes (Signed)
ANTICOAGULATION CONSULT NOTE - Follow Up Consult  Pharmacy Consult for Heparin Indication: aflutter + NSTEMI  Allergies  Allergen Reactions  . Penicillins Anaphylaxis  . Calcium-Containing Compounds     "calcium deposits on skin"  . Celebrex [Celecoxib] Itching  . Daypro [Oxaprozin] Itching  . Feldene [Piroxicam] Other (See Comments)    Bleeding     Patient Measurements: Height: 4' 10.5" (148.6 cm) Weight: 182 lb 8.7 oz (82.8 kg) IBW/kg (Calculated) : 42.05 Heparin Dosing Weight: 66.2 kg  Vital Signs: Temp: 98.5 F (36.9 C) (02/24 0800) Temp Source: Oral (02/24 0800) BP: 146/61 mmHg (02/24 0800) Pulse Rate: 40 (02/24 0800)  Labs:  Recent Labs  10/21/14 1729 10/21/14 2316 10/22/14 0429 10/22/14 1030 10/22/14 1335 10/23/14 0328  HGB 11.5*  --  11.0*  --   --  12.0  HCT 34.0*  --  33.6*  --   --  36.4  PLT 223  --  225  --   --  261  HEPARINUNFRC  --   --  0.27*  --  0.40 0.47  CREATININE 1.07  --  0.93  --   --  1.05  TROPONINI  --  0.22* 0.27* 1.49*  --   --     Estimated Creatinine Clearance: 41.4 mL/min (by C-G formula based on Cr of 1.05).   Assessment: 80 yo F presents on 2/22 with SOB and chest pain. Pharmacy to dose heparin for Aflutter. No PTA anticoag.  Anticoagulation: Heparin for NSTEMI and aflutter (CHADSVASC= 6). Considering cath. HL= 0.47 in goal. CBC stable. CHADSVASC of 6  Cardiovascular: new onset diastolic HF and bradycardia. EF= 55-60%. BP 146/61 but HR 40. Meds: Norvasc5, ASA81, IV Lasix, lisinopril, K+, Pravachol. 2/9: TC 163, TG 104, HDL 58, LDL 84  Endocrinology: DM. Glucose 118-189. No coverage. (Metformin on hold)  Gastrointestinal / Nutrition: PPI Neurology: Nephrology: SCr= 1.05. Pulmonary: Hematology / Oncology: CBC stable PTA Medication Issues: metformin, zyrtec Best Practices: hep gtt, PPI  Goal of Therapy:  Heparin level 0.3-0.7 units/ml Monitor platelets by anticoagulation protocol: Yes   Plan:  Will f/u after cath  today Continue Iv heparin at 1150 units/hr Daily HL and CBC   Shaunta Oncale S. Merilynn Finlandobertson, PharmD, BCPS Clinical Staff Pharmacist Pager [phone]  Misty Stanleyobertson, Rahel Carlton Stillinger 10/23/2014,10:24 AM

## 2014-10-23 NOTE — Interval H&P Note (Signed)
History and Physical Interval Note:  10/23/2014 5:49 PM  Marvene StaffMary C Linville  has presented today for surgery, with the diagnosis of cp  The various methods of treatment have been discussed with the patient and family. After consideration of risks, benefits and other options for treatment, the patient has consented to  Procedure(s): LEFT AND RIGHT HEART CATHETERIZATION WITH CORONARY ANGIOGRAM (N/A) as a surgical intervention .  The patient's history has been reviewed, patient examined, no change in status, stable for surgery.  I have reviewed the patient's chart and labs.  Questions were answered to the patient's satisfaction.     Tonny BollmanMichael Loeta Herst

## 2014-10-23 NOTE — CV Procedure (Signed)
    Cardiac Catheterization Procedure Note  Name: Tracey Todd MRN: [id number] DOB: 07/25/1935  Procedure: Right Heart Cath, Left Heart Cath, Selective Coronary Angiography, LV angiography  Indication: Chest pain, CHF  Procedural Details: The right groin was prepped, draped, and anesthetized with 1% lidocaine. Using the modified Seldinger technique a 5 French sheath was placed in the right femoral artery and a 7 French sheath was placed in the right femoral vein. A Swan-Ganz catheter was used for the right heart catheterization. Standard protocol was followed for recording of right heart pressures and sampling of oxygen saturations. Fick cardiac output was calculated. Standard Judkins catheters were used for selective coronary angiography and left ventriculography. There were no immediate procedural complications. The patient was transferred to the post catheterization recovery area for further monitoring.  Procedural Findings: Hemodynamics RA a wave 4 v wave 3 mean 0 RV 34/1 PA 42/5 mean 16 PCWP a wave 5 v wave 6 mean 4 LV 162/12 AO 159/58 mean 86  Oxygen saturations: PA 76 AO 97  Cardiac Output (Fick) 6.9  Cardiac Index (Fick) 3.9   Coronary angiography: Coronary dominance: right  Left mainstem: Patent vessel without obstruction, mild calcification present  Left anterior descending (LAD): Patent vessel wraps around the LV apex. Diffuse proximal irregularities without obstruction. The diagonals are patent.   Left circumflex (LCx): Large vessel. The OM is widely patent with no obstruction.   Right coronary artery (RCA): Dominant vessel. No obstructive disease throughout.   Left ventriculography: There is mild hypokinesis of the basal inferior wall but the LVEF is normal at 55%  Estimated Blood Loss: minimal  Final Conclusions:   1. Minimal nonobstructive CAD 2. Normal intracardiac filling pressures 3. Mild contraction abnormality of the left ventricle with preserved  LVEF  Tracey Todd 10/23/2014, 6:38 PM

## 2014-10-23 NOTE — H&P (View-Only) (Signed)
Patient Name: Tracey Todd Date of Encounter: 10/23/2014  Primary Cardiologist: Charlton Haws, MD   Principal Problem:   Bradycardia Active Problems:   HTN (hypertension)   Diabetes mellitus type 2, controlled   Acute on chronic diastolic CHF (congestive heart failure)   Atrial flutter   Elevated troponin   PAF-fib and flutter    SUBJECTIVE  Denies significant SOB or CP. Feeling good this morning.  CURRENT MEDS . amLODipine  5 mg Oral Daily  . aspirin EC  81 mg Oral Daily  . furosemide  40 mg Intravenous Q12H  . insulin aspart  0-9 Units Subcutaneous TID WC  . lisinopril  20 mg Oral BID  . pantoprazole  40 mg Oral Daily  . potassium chloride SA  20 mEq Oral Daily  . pravastatin  40 mg Oral q1800  . sodium chloride  3 mL Intravenous Q12H    OBJECTIVE  Filed Vitals:   10/23/14 0200 10/23/14 0302 10/23/14 0318 10/23/14 0800  BP: 118/49 115/67  146/61  Pulse: 38 42  40  Temp:   97.7 F (36.5 C) 98.5 F (36.9 C)  TempSrc:   Oral Oral  Resp: 13 20    Height:      Weight:   182 lb 8.7 oz (82.8 kg)   SpO2: 92% 96%  91%    Intake/Output Summary (Last 24 hours) at 10/23/14 0852 Last data filed at 10/23/14 0800  Gross per 24 hour  Intake    853 ml  Output   2000 ml  Net  -1147 ml   Filed Weights   10/21/14 2208 10/22/14 0420 10/23/14 0318  Weight: 195 lb 5.2 oz (88.6 kg) 189 lb 13.1 oz (86.1 kg) 182 lb 8.7 oz (82.8 kg)    PHYSICAL EXAM  General: Pleasant, NAD. Neuro: Alert and oriented X 3. Moves all extremities spontaneously. Psych: Normal affect. HEENT:  Normal  Neck: Supple without bruits or JVD. Lungs:  Resp regular and unlabored, CTA. Heart: Bradycardic no s3, s4, or murmurs. Abdomen: Soft, non-tender, non-distended, BS + x 4.  Extremities: No clubbing, cyanosis or edema. DP/PT/Radials 2+ and equal bilaterally.  Accessory Clinical Findings  CBC  Recent Labs  10/21/14 1729 10/22/14 0429 10/23/14 0328  WBC 8.2 6.5 6.9  NEUTROABS 4.6 3.9   --   HGB 11.5* 11.0* 12.0  HCT 34.0* 33.6* 36.4  MCV 83.7 85.3 83.9  PLT 223 225 261   Basic Metabolic Panel  Recent Labs  10/21/14 2316 10/22/14 0429 10/23/14 0328  NA  --  140 139  K  --  3.7 4.2  CL  --  105 98  CO2  --  28 29  GLUCOSE  --  112* 149*  BUN  --  18 14  CREATININE  --  0.93 1.05  CALCIUM  --  9.0 9.3  MG 1.9  --   --    Liver Function Tests  Recent Labs  10/22/14 0429  AST 41*  ALT 53*  ALKPHOS 48  BILITOT 0.7  PROT 5.9*  ALBUMIN 3.4*   Cardiac Enzymes  Recent Labs  10/21/14 2316 10/22/14 0429 10/22/14 1030  TROPONINI 0.22* 0.27* 1.49*   Thyroid Function Tests  Recent Labs  10/21/14 2316  TSH 3.680    TELE Sinus brady with HR 30-40s, rarely junctional rhythm    ECG  No new EKG  Echocardiogram 10/22/2014  - Left ventricle: The cavity size was normal. Wall thickness was normal. Systolic function was normal. The estimated  ejection fraction was in the range of 55% to 60%. Wall motion was normal; there were no regional wall motion abnormalities. - Aortic valve: There was mild regurgitation. - Mitral valve: There was mild regurgitation. - Left atrium: The atrium was mildly dilated. - Right atrium: The atrium was mildly dilated. - Tricuspid valve: There was moderate regurgitation. - Pulmonary arteries: Systolic pressure was moderately increased. PA peak pressure: 62 mm Hg (S).  Impressions:  - The LV systolic function is normal. There is moderate pulmonary hypertension    Radiology/Studies  Dg Chest 2 View  10/21/2014   CLINICAL DATA:  Shortness of breath.  Chest tightness since Friday.  EXAM: CHEST  2 VIEW  COMPARISON:  01/29/2013  FINDINGS: Stable mild enlargement of the cardiopericardial silhouette with tortuous thoracic aorta. Mildly indistinct pulmonary vasculature with linear densities at the lung bases suggesting subsegmental atelectasis or mild interstitial accentuation which is new compared to 01/29/13.  No  pleural effusion. Thoracic spondylosis. Degenerative AC joint arthropathy, right greater than left.  IMPRESSION: 1. Cardiomegaly without overt edema. Faint linear opacities at the lung bases may reflect subsegmental atelectasis or mild nonspecific reticulation, but no definite Kerley B-lines are observed two specifically indicate interstitial pulmonary edema.   Electronically Signed   By: Gaylyn RongWalter  Liebkemann M.D.   On: 10/21/2014 18:05    ASSESSMENT AND PLAN  39F with HTN, HLD, DM, and arthritis who p/w new onset HF, bradycardia, and elevated Troponin. Also noted to have combination of aflutter with slow ventricular rate, severe brady and junctional rhythm during admission.  1. Acute diastolic HF  - Echo 10/22/2014 EF 55-60%, no RWMA, mild MR/AR, moderate TR, PA peak pressure 62mmHg  - likely triggered by bradycardia (given the fact CO = HR x stroke volume, her CO likely reduced by bradycardia even though stroke volume is good, resulting in heart failure). Elevated pulmonary pressure noted, pending L&R HC. Hold IV lasix, further diuresis depend on RH cath result  - discussed with the patient benefit and risk of cath include bleeding, vascular/renal injury, arrhythmia, MI, stroke, loss of limb or life, she displayed clear understanding and agree to proceed.  2. Elevated trop: unclear if demand ischemia (2/2 severe bradycardia) vs underlying coronary ischemia  - 0.22 --> 0.27 --> 1.49  - has exertional component. Continue heparin.   - Will add for L&RHC today (EF normal on echo, however PA peak pressure high)  3. Bradycardia  - recently changed from verapmil to bisoprolol, likely has prior underlying conduction disease  - continue to hold all AV nodal blocking agent  - cath today, if bradycardia does not improve in 24 hours, will obtain EP consult for potential consideration of PPM  4. Possible atrial flutter with slow ventricular response  - CHADSVASC of 6, continue heparin  - EKG on 2/22 at  19:36 appear to be aflutter with slow ventricular rate  - Will consider systemic anticoagulation vs outpatient event monitor to check with recurrence before systemic anticoagulation  5. DM 6. HTN 7. HLD  Signed, Azalee CourseMeng, Hao PA-C Pager: [phone] Patient seen and examined. I agree with the assessment and plan as detailed above. See also my additional thoughts below.   There  Is significant bradycardia,but BP is stable. We will proceed with cath as soon as it can be arranged. Then make final decisions about approach to bradycardia.  Willa RoughJeffrey Katz, MD, [facility] CenterFACC 10/23/2014 9:32 AM

## 2014-10-23 NOTE — Progress Notes (Signed)
Sheath pull note: Right femoral artery 14F sheath removed and manual pressure held for 20 minutes by Tammy Mink, RCIS. Right femoral vein 36F sheath removed and manual pressure held for 15 minutes by Tammy Mink, RCIS. Post instructions given to patient and patient verbalized understanding. Site level 0 at this time. Vital signs stable throughout.

## 2014-10-23 NOTE — Progress Notes (Signed)
PROGRESS NOTE  Tracey [name] ZOX:[id number] DOB: 09/27/1934 DOA: 10/21/2014 PCP: Nadean CorwinMCKEOWN,WILLIAM DAVID, MD  Brief history 80 year old female with a history of hypertension, diabetes mellitus, hyperlipidemia presents with 2 week history of worsening exertional chest discomfort and dyspnea as well as increasing fatigue. On 10/08/2014 the patient's antihypertensive regimen was changed from verapamil and HCTZ (25mg ) to bisoprolol/HCTZ (6.25mg ). The patient went to see her primary care provider on the day of admission. EKG in the office showed bradycardia with heart rate of 36. The patient was advised them to the emergency department. Further evaluation in emergency department revealed that the patient was fluid overloaded. She was found to have atrial flutter with a slow ventricular response. The patient was started on intravenous furosemide and heparin drip. Cardiology was consulted.  Subjective: The patient feels well today- she has no complaints.   Assessment/Plan: Acute on chronic diastolic CHF -approaching euvolemia -Continue IV furosemide per cardiology -Daily weights -Echocardiogram reveals Mod pulm HTN and mod TR- normal EF  -Beta blockers held due to bradycardia Bradycardia/atrial flutter with slow ventricular response - BP stable despite bradycardia -further intervention per cardiology Chest discomfort with elevated troponin  -Cardiology consulted -Continue heparin drip  -may be related to the patient's CHF exacerbation but cannot rule out NSTEMI in the setting of exertional chest discomfort and shortness of breath - plan for cath  Hypertension  -Continue amlodipine and lisinopril  Diabetes mellitus type 2  -10/08/2014 hemoglobin A1c 6.9  -Hold metformin  -NovoLog sliding scale while the patient is in the hospital  Hyperlipidemia  -Continue statin   Family Communication:   Son at beside Disposition Plan:   Home when medically stable   Procedures/Studies: Dg  Chest 2 View  10/21/2014   CLINICAL DATA:  Shortness of breath.  Chest tightness since Friday.  EXAM: CHEST  2 VIEW  COMPARISON:  01/29/2013  FINDINGS: Stable mild enlargement of the cardiopericardial silhouette with tortuous thoracic aorta. Mildly indistinct pulmonary vasculature with linear densities at the lung bases suggesting subsegmental atelectasis or mild interstitial accentuation which is new compared to 01/29/13.  No pleural effusion. Thoracic spondylosis. Degenerative AC joint arthropathy, right greater than left.  IMPRESSION: 1. Cardiomegaly without overt edema. Faint linear opacities at the lung bases may reflect subsegmental atelectasis or mild nonspecific reticulation, but no definite Kerley B-lines are observed two specifically indicate interstitial pulmonary edema.   Electronically Signed   By: Gaylyn RongWalter  Liebkemann M.D.   On: 10/21/2014 18:05    Objective: Filed Vitals:   10/23/14 0200 10/23/14 0302 10/23/14 0318 10/23/14 0800  BP: 118/49 115/67  146/61  Pulse: 38 42  40  Temp:   97.7 F (36.5 C) 98.5 F (36.9 C)  TempSrc:   Oral Oral  Resp: 13 20    Height:      Weight:   82.8 kg (182 lb 8.7 oz)   SpO2: 92% 96%  91%    Intake/Output Summary (Last 24 hours) at 10/23/14 1122 Last data filed at 10/23/14 0800  Gross per 24 hour  Intake  818.5 ml  Output   2000 ml  Net -1181.5 ml   Weight change: -5.8 kg (-12 lb 12.6 oz) Exam:   General:  Pt is alert, follows commands appropriately, not in acute distress  HEENT: No icterus, No thrush,  /AT  Cardiovascular: irregular, S1/S2, no rubs, no gallops+ JVD  Respiratory: bibasilar crackles. No wheeze.   Abdomen: Soft/+BS, non tender, non distended, no guarding  Extremities: 1+LE edema, No lymphangitis, No petechiae, No rashes, no synovitis  Data Reviewed: Basic Metabolic Panel:  Recent Labs Lab 10/21/14 1729 10/21/14 2316 10/22/14 0429 10/23/14 0328  NA 137  --  140 139  K 4.8  --  3.7 4.2  CL 105  --  105 98    CO2 25  --  28 29  GLUCOSE 143*  --  112* 149*  BUN 21  --  18 14  CREATININE 1.07  --  0.93 1.05  CALCIUM 9.3  --  9.0 9.3  MG  --  1.9  --   --    Liver Function Tests:  Recent Labs Lab 10/22/14 0429  AST 41*  ALT 53*  ALKPHOS 48  BILITOT 0.7  PROT 5.9*  ALBUMIN 3.4*   No results for input(s): LIPASE, AMYLASE in the last 168 hours. No results for input(s): AMMONIA in the last 168 hours. CBC:  Recent Labs Lab 10/21/14 1729 10/22/14 0429 10/23/14 0328  WBC 8.2 6.5 6.9  NEUTROABS 4.6 3.9  --   HGB 11.5* 11.0* 12.0  HCT 34.0* 33.6* 36.4  MCV 83.7 85.3 83.9  PLT 223 225 261   Cardiac Enzymes:  Recent Labs Lab 10/21/14 2316 10/22/14 0429 10/22/14 1030  TROPONINI 0.22* 0.27* 1.49*   BNP: Invalid input(s): POCBNP CBG:  Recent Labs Lab 10/22/14 0742 10/22/14 1253 10/22/14 1646 10/22/14 2141 10/23/14 0755  GLUCAP 121* 118* 189* 160* 137*    Recent Results (from the past 240 hour(s))  MRSA PCR Screening     Status: None   Collection Time: 10/21/14 10:22 PM  Result Value Ref Range Status   MRSA by PCR NEGATIVE NEGATIVE Final    Comment:        The GeneXpert MRSA Assay (FDA approved for NASAL specimens only), is one component of a comprehensive MRSA colonization surveillance program. It is not intended to diagnose MRSA infection nor to guide or monitor treatment for MRSA infections.      Scheduled Meds: . sodium chloride   Intravenous Once  . amLODipine  5 mg Oral Daily  . aspirin EC  81 mg Oral Daily  . furosemide  40 mg Intravenous Q12H  . insulin aspart  0-9 Units Subcutaneous TID WC  . lisinopril  20 mg Oral BID  . pantoprazole  40 mg Oral Daily  . potassium chloride SA  20 mEq Oral Daily  . pravastatin  40 mg Oral q1800  . sodium chloride  3 mL Intravenous Q12H   Continuous Infusions: . heparin 1,150 Units/hr (10/23/14 0800)     Calvert Cantor, MD [facility] PAGER [email or web address] Password [email or web address] 10/23/2014, 11:22 AM    LOS: 2 days

## 2014-10-23 NOTE — Progress Notes (Signed)
 Patient Name: Tracey Todd Date of Encounter: 10/23/2014  Primary Cardiologist: Peter Nishan, MD   Principal Problem:   Bradycardia Active Problems:   HTN (hypertension)   Diabetes mellitus type 2, controlled   Acute on chronic diastolic CHF (congestive heart failure)   Atrial flutter   Elevated troponin   PAF-fib and flutter    SUBJECTIVE  Denies significant SOB or CP. Feeling good this morning.  CURRENT MEDS . amLODipine  5 mg Oral Daily  . aspirin EC  81 mg Oral Daily  . furosemide  40 mg Intravenous Q12H  . insulin aspart  0-9 Units Subcutaneous TID WC  . lisinopril  20 mg Oral BID  . pantoprazole  40 mg Oral Daily  . potassium chloride SA  20 mEq Oral Daily  . pravastatin  40 mg Oral q1800  . sodium chloride  3 mL Intravenous Q12H    OBJECTIVE  Filed Vitals:   10/23/14 0200 10/23/14 0302 10/23/14 0318 10/23/14 0800  BP: 118/49 115/67  146/61  Pulse: 38 42  40  Temp:   97.7 F (36.5 C) 98.5 F (36.9 C)  TempSrc:   Oral Oral  Resp: 13 20    Height:      Weight:   182 lb 8.7 oz (82.8 kg)   SpO2: 92% 96%  91%    Intake/Output Summary (Last 24 hours) at 10/23/14 0852 Last data filed at 10/23/14 0800  Gross per 24 hour  Intake    853 ml  Output   2000 ml  Net  -1147 ml   Filed Weights   10/21/14 2208 10/22/14 0420 10/23/14 0318  Weight: 195 lb 5.2 oz (88.6 kg) 189 lb 13.1 oz (86.1 kg) 182 lb 8.7 oz (82.8 kg)    PHYSICAL EXAM  General: Pleasant, NAD. Neuro: Alert and oriented X 3. Moves all extremities spontaneously. Psych: Normal affect. HEENT:  Normal  Neck: Supple without bruits or JVD. Lungs:  Resp regular and unlabored, CTA. Heart: Bradycardic no s3, s4, or murmurs. Abdomen: Soft, non-tender, non-distended, BS + x 4.  Extremities: No clubbing, cyanosis or edema. DP/PT/Radials 2+ and equal bilaterally.  Accessory Clinical Findings  CBC  Recent Labs  10/21/14 1729 10/22/14 0429 10/23/14 0328  WBC 8.2 6.5 6.9  NEUTROABS 4.6 3.9   --   HGB 11.5* 11.0* 12.0  HCT 34.0* 33.6* 36.4  MCV 83.7 85.3 83.9  PLT 223 225 261   Basic Metabolic Panel  Recent Labs  10/21/14 2316 10/22/14 0429 10/23/14 0328  NA  --  140 139  K  --  3.7 4.2  CL  --  105 98  CO2  --  28 29  GLUCOSE  --  112* 149*  BUN  --  18 14  CREATININE  --  0.93 1.05  CALCIUM  --  9.0 9.3  MG 1.9  --   --    Liver Function Tests  Recent Labs  10/22/14 0429  AST 41*  ALT 53*  ALKPHOS 48  BILITOT 0.7  PROT 5.9*  ALBUMIN 3.4*   Cardiac Enzymes  Recent Labs  10/21/14 2316 10/22/14 0429 10/22/14 1030  TROPONINI 0.22* 0.27* 1.49*   Thyroid Function Tests  Recent Labs  10/21/14 2316  TSH 3.680    TELE Sinus brady with HR 30-40s, rarely junctional rhythm    ECG  No new EKG  Echocardiogram 10/22/2014  - Left ventricle: The cavity size was normal. Wall thickness was normal. Systolic function was normal. The estimated   ejection fraction was in the range of 55% to 60%. Wall motion was normal; there were no regional wall motion abnormalities. - Aortic valve: There was mild regurgitation. - Mitral valve: There was mild regurgitation. - Left atrium: The atrium was mildly dilated. - Right atrium: The atrium was mildly dilated. - Tricuspid valve: There was moderate regurgitation. - Pulmonary arteries: Systolic pressure was moderately increased. PA peak pressure: 62 mm Hg (S).  Impressions:  - The LV systolic function is normal. There is moderate pulmonary hypertension    Radiology/Studies  Dg Chest 2 View  10/21/2014   CLINICAL DATA:  Shortness of breath.  Chest tightness since Friday.  EXAM: CHEST  2 VIEW  COMPARISON:  01/29/2013  FINDINGS: Stable mild enlargement of the cardiopericardial silhouette with tortuous thoracic aorta. Mildly indistinct pulmonary vasculature with linear densities at the lung bases suggesting subsegmental atelectasis or mild interstitial accentuation which is new compared to 01/29/13.  No  pleural effusion. Thoracic spondylosis. Degenerative AC joint arthropathy, right greater than left.  IMPRESSION: 1. Cardiomegaly without overt edema. Faint linear opacities at the lung bases may reflect subsegmental atelectasis or mild nonspecific reticulation, but no definite Kerley B-lines are observed two specifically indicate interstitial pulmonary edema.   Electronically Signed   By: Walter  Liebkemann M.D.   On: 10/21/2014 18:05    ASSESSMENT AND PLAN  79F with HTN, HLD, DM, and arthritis who p/w new onset HF, bradycardia, and elevated Troponin. Also noted to have combination of aflutter with slow ventricular rate, severe brady and junctional rhythm during admission.  1. Acute diastolic HF  - Echo 10/22/2014 EF 55-60%, no RWMA, mild MR/AR, moderate TR, PA peak pressure 62mmHg  - likely triggered by bradycardia (given the fact CO = HR x stroke volume, her CO likely reduced by bradycardia even though stroke volume is good, resulting in heart failure). Elevated pulmonary pressure noted, pending L&R HC. Hold IV lasix, further diuresis depend on RH cath result  - discussed with the patient benefit and risk of cath include bleeding, vascular/renal injury, arrhythmia, MI, stroke, loss of limb or life, she displayed clear understanding and agree to proceed.  2. Elevated trop: unclear if demand ischemia (2/2 severe bradycardia) vs underlying coronary ischemia  - 0.22 --> 0.27 --> 1.49  - has exertional component. Continue heparin.   - Will add for L&RHC today (EF normal on echo, however PA peak pressure high)  3. Bradycardia  - recently changed from verapmil to bisoprolol, likely has prior underlying conduction disease  - continue to hold all AV nodal blocking agent  - cath today, if bradycardia does not improve in 24 hours, will obtain EP consult for potential consideration of PPM  4. Possible atrial flutter with slow ventricular response  - CHADSVASC of 6, continue heparin  - EKG on 2/22 at  19:36 appear to be aflutter with slow ventricular rate  - Will consider systemic anticoagulation vs outpatient event monitor to check with recurrence before systemic anticoagulation  5. DM 6. HTN 7. HLD  Signed, Meng, Hao PA-C Pager: [phone] Patient seen and examined. I agree with the assessment and plan as detailed above. See also my additional thoughts below.   There  Is significant bradycardia,but BP is stable. We will proceed with cath as soon as it can be arranged. Then make final decisions about approach to bradycardia.  Damontay Alred, MD, FACC 10/23/2014 9:32 AM   

## 2014-10-24 ENCOUNTER — Ambulatory Visit: Payer: Medicare Other | Admitting: Podiatrist

## 2014-10-24 ENCOUNTER — Encounter (HOSPITAL_COMMUNITY): Payer: Self-pay | Admitting: Cardiovascular Disease

## 2014-10-24 DIAGNOSIS — IMO0001 Reserved for inherently not codable concepts without codable children: Secondary | ICD-10-CM

## 2014-10-24 DIAGNOSIS — Z0389 Encounter for observation for other suspected diseases and conditions ruled out: Secondary | ICD-10-CM

## 2014-10-24 DIAGNOSIS — E119 Type 2 diabetes mellitus without complications: Secondary | ICD-10-CM

## 2014-10-24 HISTORY — DX: Reserved for inherently not codable concepts without codable children: IMO0001

## 2014-10-24 LAB — CBC
HCT: 37.4 % (ref 36.0–46.0)
Hemoglobin: 12.7 g/dL (ref 12.0–15.0)
MCH: 29.2 pg (ref 26.0–34.0)
MCHC: 34 g/dL (ref 30.0–36.0)
MCV: 86 fL (ref 78.0–100.0)
PLATELETS: 240 10*3/uL (ref 150–400)
RBC: 4.35 MIL/uL (ref 3.87–5.11)
RDW: 16.1 % — ABNORMAL HIGH (ref 11.5–15.5)
WBC: 7.1 10*3/uL (ref 4.0–10.5)

## 2014-10-24 LAB — BASIC METABOLIC PANEL
ANION GAP: 10 (ref 5–15)
BUN: 12 mg/dL (ref 6–23)
CHLORIDE: 105 mmol/L (ref 96–112)
CO2: 24 mmol/L (ref 19–32)
Calcium: 9.2 mg/dL (ref 8.4–10.5)
Creatinine, Ser: 0.95 mg/dL (ref 0.50–1.10)
GFR calc non Af Amer: 56 mL/min — ABNORMAL LOW (ref >90)
GFR, EST AFRICAN AMERICAN: 65 mL/min — AB (ref >90)
Glucose, Bld: 135 mg/dL — ABNORMAL HIGH (ref 70–99)
POTASSIUM: 4 mmol/L (ref 3.5–5.1)
Sodium: 139 mmol/L (ref 135–145)

## 2014-10-24 LAB — GLUCOSE, CAPILLARY
GLUCOSE-CAPILLARY: 140 mg/dL — AB (ref 70–99)
GLUCOSE-CAPILLARY: 184 mg/dL — AB (ref 70–99)
Glucose-Capillary: 213 mg/dL — ABNORMAL HIGH (ref 70–99)
Glucose-Capillary: 147 mg/dL — ABNORMAL HIGH (ref 70–99)

## 2014-10-24 MED ORDER — FUROSEMIDE 40 MG PO TABS: 40.0000 mg | ORAL_TABLET | Freq: Every day | ORAL | Status: DC

## 2014-10-24 MED ORDER — FUROSEMIDE 40 MG PO TABS
40.0000 mg | ORAL_TABLET | Freq: Every day | ORAL | Status: DC
  Administered 2014-10-25 – 2014-10-26 (×2): 40 mg via ORAL
  Filled 2014-10-24 (×5): qty 1

## 2014-10-24 NOTE — Consult Note (Signed)
       Reason for Consult:   Bradycardia, PAF, CHF  Requesting Physician: Dr Katz  HPI: This is a 79 y.o. female with a past medical history significant for HTN and DM. She has no prior history of CAD or MI. She was admitted 10/21/14 with CHF. She was noted to be bradycardic with rates in the 40s as well as PAF. She had recently been taken off Verapamil and placed on Bystolic, (reportedly secondary to cost) by her PCP Dr McKeown. She improved with diuresis and all AVN blocking agents held. Her HR has remained slow 35-40.  Her troponin was elevated and trended up after admission. She gave a history consistent with exertional angina and underwent diagnostic cath 10/23/14 which revealed normal coronaries and LVF. EP is now asked to evaluate for possible pacemaker.   PMHx:  Past Medical History  Diagnosis Date  . Vitamin D deficiency   . Nodule of chest wall   . Heel pain     left foot  . Hypercholesteremia   . HTN (hypertension)   . Type II or unspecified type diabetes mellitus without mention of complication, not stated as uncontrolled   . DJD (degenerative joint disease)   . Fatty liver   . Normal coronary arteries 2/10/03/14  . Acute CHF 10/21/14    secondary to bradycardia    Past Surgical History  Procedure Laterality Date  . Tonsillectomy and adenoidectomy    . Total knee arthroplasty    . Breast surgery    . Cataract extraction    . Left and right heart catheterization with coronary angiogram N/A 10/23/2014    Procedure: LEFT AND RIGHT HEART CATHETERIZATION WITH CORONARY ANGIOGRAM;  Surgeon: Michael D Cooper, MD;  Location: [facility] CATH [facility];  Service: Cardiovascular;  Laterality: N/A;    SOCHx:  reports that she quit smoking about 50 years ago. She does not have any smokeless tobacco history on file. She reports that she does not drink alcohol. Her drug history is not on file.  FAMHx: Family History  Problem Relation Age of Onset  . Hypertension    . Cancer    . Diabetes     . Heart disease      ALLERGIES: Allergies  Allergen Reactions  . Penicillins Anaphylaxis  . Calcium-Containing Compounds     "calcium deposits on skin"  . Celebrex [Celecoxib] Itching  . Daypro [Oxaprozin] Itching  . Feldene [Piroxicam] Other (See Comments)    Bleeding     ROS: Pertinent items are noted in HPI. see H&P for complete ROS  HOME MEDICATIONS: Prior to Admission medications   Medication Sig Start Date End Date Taking? Authorizing Provider  Acetaminophen (ARTHRITIS PAIN RELIEVER PO) Take by mouth as needed.   Yes Historical Provider, MD  aspirin 81 MG tablet Take 81 mg by mouth daily.   Yes Historical Provider, MD  Cetirizine HCl (ZYRTEC ALLERGY PO) Take 1 tablet by mouth daily. 11/28/12  Yes Historical Provider, MD  Cholecalciferol (VITAMIN D) 2000 UNITS CAPS Take 2 capsules by mouth daily.   Yes Historical Provider, MD  FREESTYLE LITE test strip USE ONE STRIP TO CHECK GLUCOSE ONCE DAILY 03/06/14  Yes Amanda Collier, PA-C  Lancets (FREESTYLE) lancets USE ONE LANCET TO CHECK GLUCOSE EVERY DAY 08/05/14  Yes Amanda Collier, PA-C  Magnesium 500 MG TABS Take 500 mg by mouth daily.   Yes Historical Provider, MD  metFORMIN (GLUCOPHAGE XR) 500 MG 24 hr tablet Take 2 tablets twice daily with meals for   Diabetes 04/04/14 04/05/15 Yes William McKeown, MD  omeprazole (PRILOSEC) 20 MG capsule Take 20 mg by mouth daily.   Yes Historical Provider, MD  potassium chloride SA (K-DUR,KLOR-CON) 20 MEQ tablet Take 1 tablet (20 mEq total) by mouth daily. 07/05/14  Yes William McKeown, MD  pravastatin (PRAVACHOL) 40 MG tablet TAKE ONE TABLET BY MOUTH ONCE DAILY AT BEDTIME FOR CHOLESTEROL 08/19/14  Yes William McKeown, MD  quinapril (ACCUPRIL) 20 MG tablet Take 1 tablet (20 mg total) by mouth 2 (two) times daily. 07/05/14  Yes William McKeown, MD  amLODipine (NORVASC) 5 MG tablet Take 1 tablet (5 mg total) by mouth daily. 10/21/14   Amanda Collier, PA-C  furosemide (LASIX) 40 MG tablet Take 1 tablet (40  mg total) by mouth daily. 10/21/14   Amanda Collier, PA-C  glucose blood (FREESTYLE LITE) test strip ONE STRIP TO CHECK GLUCOSE ONCE DAILY DX E10.9 08/05/14   Amanda Collier, PA-C    HOSPITAL MEDICATIONS: I have reviewed the patient's current medications.  VITALS: Blood pressure 119/62, pulse 77, temperature 98.1 F (36.7 C), temperature source Oral, resp. rate 23, height 4' 10.5" (1.486 m), weight 185 lb 6.5 oz (84.1 kg), SpO2 94 %.  PHYSICAL EXAM: General appearance: alert, cooperative and no distress Neck: no carotid bruit and no JVD Lungs: clear to auscultation bilaterally Heart: regular rate and rhythm and slow rate Abdomen: soft, non-tender; bowel sounds normal; no masses,  no organomegaly Extremities: extremities normal, atraumatic, no cyanosis or edema Pulses: 2+ and symmetric Skin: Skin color, texture, turgor normal. No rashes or lesions Neurologic: Grossly normal  LABS: Results for orders placed or performed during the hospital encounter of 10/21/14 (from the past 24 hour(s))  Glucose, capillary     Status: Abnormal   Collection Time: 10/23/14  4:54 PM  Result Value Ref Range   Glucose-Capillary 114 (H) 70 - 99 mg/dL   Comment 1 Notify RN    Comment 2 Documented in Char   I-STAT 3, venous blood gas (G3P V)     Status: Abnormal   Collection Time: 10/23/14  6:22 PM  Result Value Ref Range   pH, Ven 7.383 (H) 7.250 - 7.300   pCO2, Ven 42.5 (L) 45.0 - 50.0 mmHg   pO2, Ven 42.0 30.0 - 45.0 mmHg   Bicarbonate 25.3 (H) 20.0 - 24.0 mEq/L   TCO2 27 0 - 100 mmol/L   O2 Saturation 76.0 %   Sample type VENOUS   I-STAT 3, arterial blood gas (G3+)     Status: Abnormal   Collection Time: 10/23/14  6:23 PM  Result Value Ref Range   pH, Arterial 7.371 7.350 - 7.450   pCO2 arterial 43.6 35.0 - 45.0 mmHg   pO2, Arterial 97.0 80.0 - 100.0 mmHg   Bicarbonate 25.2 (H) 20.0 - 24.0 mEq/L   TCO2 27 0 - 100 mmol/L   O2 Saturation 97.0 %   Sample type ARTERIAL   POCT Activated  clotting time     Status: None   Collection Time: 10/23/14  6:31 PM  Result Value Ref Range   Activated Clotting Time 110 seconds  CBC     Status: None   Collection Time: 10/23/14  8:13 PM  Result Value Ref Range   WBC 6.7 4.0 - 10.5 K/uL   RBC 4.51 3.87 - 5.11 MIL/uL   Hemoglobin 12.7 12.0 - 15.0 g/dL   HCT 37.9 36.0 - 46.0 %   MCV 84.0 78.0 - 100.0 fL   MCH 28.2 26.0 -   34.0 pg   MCHC 33.5 30.0 - 36.0 g/dL   RDW 15.5 11.5 - 15.5 %   Platelets 240 150 - 400 K/uL  Creatinine, serum     Status: Abnormal   Collection Time: 10/23/14  8:13 PM  Result Value Ref Range   Creatinine, Ser 0.88 0.50 - 1.10 mg/dL   GFR calc non Af Amer 62 (L) >90 mL/min   GFR calc Af Amer 72 (L) >90 mL/min  Glucose, capillary     Status: Abnormal   Collection Time: 10/23/14  9:28 PM  Result Value Ref Range   Glucose-Capillary 214 (H) 70 - 99 mg/dL  CBC     Status: Abnormal   Collection Time: 10/24/14  3:15 AM  Result Value Ref Range   WBC 7.1 4.0 - 10.5 K/uL   RBC 4.35 3.87 - 5.11 MIL/uL   Hemoglobin 12.7 12.0 - 15.0 g/dL   HCT 37.4 36.0 - 46.0 %   MCV 86.0 78.0 - 100.0 fL   MCH 29.2 26.0 - 34.0 pg   MCHC 34.0 30.0 - 36.0 g/dL   RDW 16.1 (H) 11.5 - 15.5 %   Platelets 240 150 - 400 K/uL  Basic metabolic panel     Status: Abnormal   Collection Time: 10/24/14  3:15 AM  Result Value Ref Range   Sodium 139 135 - 145 mmol/L   Potassium 4.0 3.5 - 5.1 mmol/L   Chloride 105 96 - 112 mmol/L   CO2 24 19 - 32 mmol/L   Glucose, Bld 135 (H) 70 - 99 mg/dL   BUN 12 6 - 23 mg/dL   Creatinine, Ser 0.95 0.50 - 1.10 mg/dL   Calcium 9.2 8.4 - 10.5 mg/dL   GFR calc non Af Amer 56 (L) >90 mL/min   GFR calc Af Amer 65 (L) >90 mL/min   Anion gap 10 5 - 15  Glucose, capillary     Status: Abnormal   Collection Time: 10/24/14  8:10 AM  Result Value Ref Range   Glucose-Capillary 140 (H) 70 - 99 mg/dL   Comment 1 Notify RN    Comment 2 Documented in Char   Glucose, capillary     Status: Abnormal   Collection Time:  10/24/14 11:39 AM  Result Value Ref Range   Glucose-Capillary 213 (H) 70 - 99 mg/dL    EKG: On 10/22/14- NSR, sinus bradycardia-41, nonspecific TWI. Telemetry reportedly has show periods of PAF and flutter with slow VR  IMAGING: EXAM: CHEST 2 VIEW 10/01/14 COMPARISON: 01/29/2013  FINDINGS: Stable mild enlargement of the cardiopericardial silhouette with tortuous thoracic aorta. Mildly indistinct pulmonary vasculature with linear densities at the lung bases suggesting subsegmental atelectasis or mild interstitial accentuation which is new compared to 01/29/13.  No pleural effusion. Thoracic spondylosis. Degenerative AC joint arthropathy, right greater than left.  IMPRESSION: 1. Cardiomegaly without overt edema. Faint linear opacities at the lung bases may reflect subsegmental atelectasis or mild nonspecific reticulation, but no definite Kerley B-lines are observed two specifically indicate interstitial pulmonary edema.   Electronically Signed  By: Walter Liebkemann M.D.  On: 10/21/2014 18:05  Echo 10/22/14 Study Conclusions  - Left ventricle: The cavity size was normal. Wall thickness was normal. Systolic function was normal. The estimated ejection fraction was in the range of 55% to 60%. Wall motion was normal; there were no regional wall motion abnormalities. - Aortic valve: There was mild regurgitation. - Mitral valve: There was mild regurgitation. - Left atrium: The atrium was mildly dilated. - Right atrium:   The atrium was mildly dilated. - Tricuspid valve: There was moderate regurgitation. - Pulmonary arteries: Systolic pressure was moderately increased. PA peak pressure: 62 mm Hg (S).  Impressions:  - The LV systolic function is normal. There is moderate pulmonary hypertension    IMPRESSION: Principal Problem:   Bradycardia Active Problems:   Acute on chronic diastolic CHF (congestive heart failure)   HTN (hypertension)   Diabetes  mellitus type 2, controlled   Atrial flutter   PAF-fib and flutter   Hyperlipidemia   Elevated troponin   Normal coronary arteries and LVF 10/23/14   Time Spent Directly with Patient: 30 minutes  KILROY,LUKE [phone] beeper 10/24/2014, 1:52 PM   I have seen, examined the patient, and reviewed the above assessment and plan.  Changes to above are made where necessary.  The patient has symptomatic sinus bradycardia.  No reversible causes have been found.   I would therefore recommend pacemaker implantation at this time.  Risks, benefits, alternatives to pacemaker implantation were discussed in detail with the patient today. The patient understands that the risks include but are not limited to bleeding, infection, pneumothorax, perforation, tamponade, vascular damage, renal failure, MI, stroke, death,  and lead dislodgement and wishes to proceed. We will therefore schedule the procedure at the next available time. She also has atrial flutter (typical) by ekg.  Her chads2vasc score is at lest 5.  We will likely initiate anticoagulation once her pacemaker pocket has healed (as an outpatient).  We will follow for both afib and atrial flutter through her pacemaker and make decisions about management in the future once we have a better understanding of her arrhythmia burden.   Co Sign: Danna Sewell, MD 10/25/2014 11:11 AM   

## 2014-10-24 NOTE — Progress Notes (Signed)
Utilization review completed.  

## 2014-10-24 NOTE — Care Management Note (Signed)
    Page 1 of 1   10/24/2014     2:42:33 PM CARE MANAGEMENT NOTE 10/24/2014  Patient:  Tracey Todd,[name]   Account Number:  [id number]  Date Initiated:  10/24/2014  Documentation initiated by:  Tracey Todd,Jailani Hogans  Subjective/Objective Assessment:   Pt admitted with c/p, and bradycardia, CHF     Action/Plan:   PTA pt lived at home was independent,   Anticipated DC Date:  10/26/2014   Anticipated DC Plan:  HOME/SELF CARE      DC Planning Services  CM consult      Choice offered to / List presented to:             Status of service:  In process, will continue to follow Medicare Important Message given?  YES (If response is "NO", the following Medicare IM given date fields will be blank) Date Medicare IM given:  10/24/2014 Medicare IM given by:  Tracey Todd,Tracey Todd Date Additional Medicare IM given:   Additional Medicare IM given by:    Discharge Disposition:    Per UR Regulation:  Reviewed for med. necessity/level of care/duration of stay  If discussed at Long Length of Stay Meetings, dates discussed:    Comments:  10/24/14- 1030- Tracey PieriniKristi Leeann Bady RN, BSN [phone] Referral for possible d/c needs- spoke with pt at bedside- per conversation pt states that she was independent with no use of any DME prior to admission- pt for possible pacemaker - cards to see today in reference to pacemaker. s/p cath yesterday. No d/c needs noted- CM will continue to follow.

## 2014-10-24 NOTE — Progress Notes (Signed)
PROGRESS NOTE  Tracey [name] AVW:[id number] DOB: 10/16/1934 DOA: 10/21/2014 PCP: Nadean CorwinMCKEOWN,WILLIAM DAVID, MD  Brief history 80 year old female with a history of hypertension, diabetes mellitus, hyperlipidemia presents with 2 week history of worsening exertional chest discomfort and dyspnea as well as increasing fatigue. On 10/08/2014 the patient's antihypertensive regimen was changed from verapamil and HCTZ (25mg ) to bisoprolol/HCTZ (6.25mg ). The patient went to see her primary care provider on the day of admission. EKG in the office showed bradycardia with heart rate of 36. The patient was advised them to the emergency department. Further evaluation in emergency department revealed that the patient was fluid overloaded. She was found to have atrial flutter with a slow ventricular response. The patient was started on intravenous furosemide and heparin drip. Cardiology was consulted.  Subjective: The patient feels well - she is sitting up in a chair at the bedside.   Assessment/Plan: Acute on chronic diastolic CHF -approaching euvolemia -Continue IV furosemide per cardiology -Daily weights -Echocardiogram reveals Mod pulm HTN and mod TR- normal EF  -Beta blockers held due to bradycardia  Bradycardia/atrial flutter with slow ventricular response - BP stable despite bradycardia -further intervention per cardiology-- EP eval requested by cardiology  Chest discomfort with elevated troponin  -Cardiology consulted -Continue heparin drip  -may be related to the patient's CHF exacerbation but cannot rule out NSTEMI in the setting of exertional chest discomfort and shortness of breath - cath reveals minimal nonobstructive CAD  Hypertension  -Continue amlodipine and lisinopril   Diabetes mellitus type 2  -10/08/2014 hemoglobin A1c 6.9  -Hold metformin  -NovoLog sliding scale while the patient is in the hospital   Hyperlipidemia  -Continue statin   Family Communication:      Disposition Plan:   Home when medically stable   Procedures/Studies: Cath 2/24  Objective: Filed Vitals:   10/24/14 1051 10/24/14 1122 10/24/14 1144 10/24/14 1146  BP: 144/91 124/50 129/50 119/62  Pulse: 53 48 45 77  Temp:      TempSrc:      Resp: 18 20 16 23   Height:      Weight:      SpO2: 98% 94% 92% 94%    Intake/Output Summary (Last 24 hours) at 10/24/14 1441 Last data filed at 10/24/14 0600  Gross per 24 hour  Intake 748.33 ml  Output    575 ml  Net 173.33 ml   Weight change: 1.3 kg (2 lb 13.9 oz) Exam:   General:  Pt is alert, follows commands appropriately, not in acute distress  HEENT: No icterus, No thrush,  [place]/AT  Cardiovascular: irregular, S1/S2, no rubs, no gallops+ JVD  Respiratory: bibasilar crackles. No wheeze.   Abdomen: Soft/+BS, non tender, non distended, no guarding  Extremities: No LE edema, No lymphangitis, No petechiae, No rashes, no synovitis  Data Reviewed: Basic Metabolic Panel:  Recent Labs Lab 10/21/14 1729 10/21/14 2316 10/22/14 0429 10/23/14 0328 10/23/14 2013 10/24/14 0315  NA 137  --  140 139  --  139  K 4.8  --  3.7 4.2  --  4.0  CL 105  --  105 98  --  105  CO2 25  --  28 29  --  24  GLUCOSE 143*  --  112* 149*  --  135*  BUN 21  --  18 14  --  12  CREATININE 1.07  --  0.93 1.05 0.88 0.95  CALCIUM 9.3  --  9.0 9.3  --  9.2  MG  --  1.9  --   --   --   --    Liver Function Tests:  Recent Labs Lab 10/22/14 0429  AST 41*  ALT 53*  ALKPHOS 48  BILITOT 0.7  PROT 5.9*  ALBUMIN 3.4*   No results for input(s): LIPASE, AMYLASE in the last 168 hours. No results for input(s): AMMONIA in the last 168 hours. CBC:  Recent Labs Lab 10/21/14 1729 10/22/14 0429 10/23/14 0328 10/23/14 2013 10/24/14 0315  WBC 8.2 6.5 6.9 6.7 7.1  NEUTROABS 4.6 3.9  --   --   --   HGB 11.5* 11.0* 12.0 12.7 12.7  HCT 34.0* 33.6* 36.4 37.9 37.4  MCV 83.7 85.3 83.9 84.0 86.0  PLT 223 225 261 240 240   Cardiac Enzymes:  Recent  Labs Lab 10/21/14 2316 10/22/14 0429 10/22/14 1030  TROPONINI 0.22* 0.27* 1.49*   BNP: Invalid input(s): POCBNP CBG:  Recent Labs Lab 10/23/14 1154 10/23/14 1654 10/23/14 2128 10/24/14 0810 10/24/14 1139  GLUCAP 130* 114* 214* 140* 213*    Recent Results (from the past 240 hour(s))  MRSA PCR Screening     Status: None   Collection Time: 10/21/14 10:22 PM  Result Value Ref Range Status   MRSA by PCR NEGATIVE NEGATIVE Final    Comment:        The GeneXpert MRSA Assay (FDA approved for NASAL specimens only), is one component of a comprehensive MRSA colonization surveillance program. It is not intended to diagnose MRSA infection nor to guide or monitor treatment for MRSA infections.      Scheduled Meds: . sodium chloride   Intravenous Once  . amLODipine  5 mg Oral Daily  . aspirin EC  81 mg Oral Daily  . furosemide  40 mg Oral Q breakfast  . heparin  5,000 Units Subcutaneous 3 times per day  . insulin aspart  0-9 Units Subcutaneous TID WC  . lisinopril  20 mg Oral BID  . pantoprazole  40 mg Oral Daily  . potassium chloride SA  20 mEq Oral Daily  . pravastatin  40 mg Oral q1800  . sodium chloride  3 mL Intravenous Q12H   Continuous Infusions:     Calvert Cantor, MD [facility] PAGER [email or web address] Password [email or web address] 10/24/2014, 2:41 PM   LOS: 3 days

## 2014-10-24 NOTE — Progress Notes (Signed)
Medicare Important Message given? YES  (If response is "NO", the following Medicare IM given date fields will be blank)  Date Medicare IM given: 10/24/14 Medicare IM given by:  Isma Tietje  

## 2014-10-24 NOTE — Progress Notes (Signed)
Patient Name: Tracey Todd Date of Encounter: 10/24/2014  Primary Cardiologist: Charlton Haws, MD   Principal Problem:   Bradycardia Active Problems:   HTN (hypertension)   Diabetes mellitus type 2, controlled   Acute on chronic diastolic CHF (congestive heart failure)   Atrial flutter   Elevated troponin   PAF-fib and flutter    SUBJECTIVE  Denies significant SOB or CP. Feels ok at rest, however had dizziness with ambulation. She states this is not normal for her, she does not have this degree of weakness and dizziness before when she walked with a cane.  Telemetry continue to show alternating sinus brady with HR 30-40s overnight, this morning she woke up around 6:15am, she had a few episode of junctional brady after that. Also had occasional rhythm of what appear as a-flutter(?) with slow ventricular rate. Fortunately it appears that when she walks her HR does go up to 70s.   CURRENT MEDS . sodium chloride   Intravenous Once  . amLODipine  5 mg Oral Daily  . aspirin EC  81 mg Oral Daily  . furosemide  40 mg Intravenous Q12H  . heparin  5,000 Units Subcutaneous 3 times per day  . insulin aspart  0-9 Units Subcutaneous TID WC  . lisinopril  20 mg Oral BID  . pantoprazole  40 mg Oral Daily  . potassium chloride SA  20 mEq Oral Daily  . pravastatin  40 mg Oral q1800  . sodium chloride  3 mL Intravenous Q12H    OBJECTIVE  Filed Vitals:   10/24/14 0351 10/24/14 0700 10/24/14 0727 10/24/14 1051  BP: 104/55  125/58 144/91  Pulse: 39  41 53  Temp: 97.8 F (36.6 C) 98.1 F (36.7 C)    TempSrc: Oral Oral    Resp: Height: 4' 10.5" (1.486 m)     Weight: 185 lb 6.5 oz (84.1 kg)     SpO2: 95%  95% 98%    Intake/Output Summary (Last 24 hours) at 10/24/14 1058 Last data filed at 10/24/14 0600  Gross per 24 hour  Intake 748.33 ml  Output    950 ml  Net -201.67 ml   Filed Weights   10/22/14 0420 10/23/14 0318 10/24/14 0351  Weight: 189 lb 13.1 oz (86.1 kg)  182 lb 8.7 oz (82.8 kg) 185 lb 6.5 oz (84.1 kg)    PHYSICAL EXAM  General: Pleasant, NAD. Neuro: Alert and oriented X 3. Moves all extremities spontaneously. Psych: Normal affect. HEENT:  Normal  Neck: Supple without bruits or JVD. Lungs:  Resp regular and unlabored, CTA. Heart: Bradycardic no s3, s4, or murmurs. Abdomen: Soft, non-tender, non-distended, BS + x 4.  Extremities: No clubbing, cyanosis or edema. DP/PT/Radials 2+ and equal bilaterally.  Accessory Clinical Findings  CBC  Recent Labs  10/21/14 1729 10/22/14 0429  10/23/14 2013 10/24/14 0315  WBC 8.2 6.5  < > 6.7 7.1  NEUTROABS 4.6 3.9  --   --   --   HGB 11.5* 11.0*  < > 12.7 12.7  HCT 34.0* 33.6*  < > 37.9 37.4  MCV 83.7 85.3  < > 84.0 86.0  PLT 223 225  < > 240 240  < > = values in this interval not displayed. Basic Metabolic Panel  Recent Labs  10/21/14 2316  10/23/14 0328 10/23/14 2013 10/24/14 0315  NA  --   < > 139  --  139  K  --   < > 4.2  --  4.0  CL  --   < > 98  --  105  CO2  --   < > 29  --  24  GLUCOSE  --   < > 149*  --  135*  BUN  --   < > 14  --  12  CREATININE  --   < > 1.05 0.88 0.95  CALCIUM  --   < > 9.3  --  9.2  MG 1.9  --   --   --   --   < > = values in this interval not displayed. Liver Function Tests  Recent Labs  10/22/14 0429  AST 41*  ALT 53*  ALKPHOS 48  BILITOT 0.7  PROT 5.9*  ALBUMIN 3.4*   Cardiac Enzymes  Recent Labs  10/21/14 2316 10/22/14 0429 10/22/14 1030  TROPONINI 0.22* 0.27* 1.49*   Thyroid Function Tests  Recent Labs  10/21/14 2316  TSH 3.680    TELE Sinus brady with HR 30-40s, occasional junctional rhythm, rarely aflutter?    ECG  No new EKG  Echocardiogram 10/22/2014  - Left ventricle: The cavity size was normal. Wall thickness was normal. Systolic function was normal. The estimated ejection fraction was in the range of 55% to 60%. Wall motion was normal; there were no regional wall motion abnormalities. - Aortic  valve: There was mild regurgitation. - Mitral valve: There was mild regurgitation. - Left atrium: The atrium was mildly dilated. - Right atrium: The atrium was mildly dilated. - Tricuspid valve: There was moderate regurgitation. - Pulmonary arteries: Systolic pressure was moderately increased. PA peak pressure: 62 mm Hg (S).  Impressions:  - The LV systolic function is normal. There is moderate pulmonary hypertension    Radiology/Studies  Dg Chest 2 View  10/21/2014   CLINICAL DATA:  Shortness of breath.  Chest tightness since Friday.  EXAM: CHEST  2 VIEW  COMPARISON:  01/29/2013  FINDINGS: Stable mild enlargement of the cardiopericardial silhouette with tortuous thoracic aorta. Mildly indistinct pulmonary vasculature with linear densities at the lung bases suggesting subsegmental atelectasis or mild interstitial accentuation which is new compared to 01/29/13.  No pleural effusion. Thoracic spondylosis. Degenerative AC joint arthropathy, right greater than left.  IMPRESSION: 1. Cardiomegaly without overt edema. Faint linear opacities at the lung bases may reflect subsegmental atelectasis or mild nonspecific reticulation, but no definite Kerley B-lines are observed two specifically indicate interstitial pulmonary edema.   Electronically Signed   By: Gaylyn RongWalter  Liebkemann M.D.   On: 10/21/2014 18:05    ASSESSMENT AND PLAN  83F with HTN, HLD, DM, and arthritis who p/w new onset HF, bradycardia, and elevated Troponin. Also noted to have combination of aflutter with slow ventricular rate, severe brady and junctional rhythm during admission.  1. Acute diastolic HF  - Echo 10/22/2014 EF 55-60%, no RWMA, mild MR/AR, moderate TR, PA peak pressure 62mmHg  - resolved. RHC shows normal wedge pressure, pulmonary pressure not as high as echo suggested, likely improved after diuresis. She is euvolemic, will d/c IV lasix, and change to PO lasix.   2. Elevated trop: unclear if demand ischemia (2/2 severe  bradycardia) vs underlying coronary ischemia  - 0.22 --> 0.27 --> 1.49  - has exertional component. Continue heparin.   - LHC shows minimal nonobstructive CAD with EF 55%  3. Bradycardia  - recently changed from verapmil to bisoprolol, likely has prior underlying conduction disease  - continue to hold all AV nodal blocking agent  - continue to be bradycardic,  with occasional junctional rhythm.  HR increases slightly  with ambulation. May need PPM at some point, will discuss with Dr. Myrtis Ser regarding EP consult  4. Possible atrial flutter with slow ventricular response  - CHADSVASC of 6, continue heparin  - EKG on 2/22 at 19:36 appear to be aflutter with slow ventricular rate  - Will consider systemic anticoagulation vs outpatient event monitor to check with recurrence before systemic anticoagulation  5. DM 6. HTN 7. HLD  Signed, Amedeo Plenty Pager: [phone]  Marena Witts There is an EKG from this admission that shows atrial flutter. Currently the patient's rhythm varies from sinus bradycardia to a junctional rhythm. The patient had slight increase in heart rate with ambulation. She had dizziness. She has been at bedrest. I am concerned that the patient has sick sinus syndrome with bradycardia. We certainly cannot use any medications to protect her from rapid heart rate if she has recurrent atrial flutter. In addition, she may have chronotropic incompetence. Slow heart rate on admission (while on a beta blocker) was the basis of her CHF. Considering all of these factors I feel that she may well need a pacemaker. I am calling the electrophysiology team to get their input.   Jerral Bonito, MD

## 2014-10-25 ENCOUNTER — Encounter: Payer: Self-pay | Admitting: *Deleted

## 2014-10-25 ENCOUNTER — Encounter (HOSPITAL_COMMUNITY)
Admission: EM | Disposition: A | Discharge status: Home / Self Care | Payer: Self-pay | Source: Home / Self Care | Attending: Internal Medicine

## 2014-10-25 DIAGNOSIS — I495 Sick sinus syndrome: Secondary | ICD-10-CM

## 2014-10-25 DIAGNOSIS — I483 Typical atrial flutter: Secondary | ICD-10-CM

## 2014-10-25 HISTORY — PX: PERMANENT PACEMAKER INSERTION: SHX5480

## 2014-10-25 LAB — CBC
HEMATOCRIT: 35.7 % — AB (ref 36.0–46.0)
Hemoglobin: 11.7 g/dL — ABNORMAL LOW (ref 12.0–15.0)
MCH: 27.8 pg (ref 26.0–34.0)
MCHC: 32.8 g/dL (ref 30.0–36.0)
MCV: 84.8 fL (ref 78.0–100.0)
Platelets: 243 10*3/uL (ref 150–400)
RBC: 4.21 MIL/uL (ref 3.87–5.11)
RDW: 15.9 % — ABNORMAL HIGH (ref 11.5–15.5)
WBC: 7.5 10*3/uL (ref 4.0–10.5)

## 2014-10-25 LAB — GLUCOSE, CAPILLARY
GLUCOSE-CAPILLARY: 131 mg/dL — AB (ref 70–99)
GLUCOSE-CAPILLARY: 168 mg/dL — AB (ref 70–99)
Glucose-Capillary: 130 mg/dL — ABNORMAL HIGH (ref 70–99)
Glucose-Capillary: 154 mg/dL — ABNORMAL HIGH (ref 70–99)
Glucose-Capillary: 136 mg/dL — ABNORMAL HIGH (ref 70–99)

## 2014-10-25 SURGERY — PERMANENT PACEMAKER INSERTION
Anesthesia: LOCAL

## 2014-10-25 MED ORDER — BISACODYL 10 MG RE SUPP: 10.0000 mg | Freq: Every day | RECTAL | Status: DC

## 2014-10-25 MED ORDER — LIDOCAINE HCL (PF) 1 % IJ SOLN
INTRAMUSCULAR | Status: AC
  Filled 2014-10-25: qty 30

## 2014-10-25 MED ORDER — SODIUM CHLORIDE 0.9 % IV SOLN: INTRAVENOUS | Status: DC

## 2014-10-25 MED ORDER — VANCOMYCIN HCL IN DEXTROSE 1-5 GM/200ML-% IV SOLN
1000.0000 mg | INTRAVENOUS | Status: DC
  Filled 2014-10-25: qty 200

## 2014-10-25 MED ORDER — ONDANSETRON HCL 4 MG/2ML IJ SOLN: 4.0000 mg | Freq: Four times a day (QID) | INTRAMUSCULAR | Status: DC | PRN

## 2014-10-25 MED ORDER — CHLORHEXIDINE GLUCONATE 4 % EX LIQD: 60.0000 mL | Freq: Once | CUTANEOUS | Status: DC

## 2014-10-25 MED ORDER — VANCOMYCIN HCL IN DEXTROSE 1-5 GM/200ML-% IV SOLN
1000.0000 mg | Freq: Two times a day (BID) | INTRAVENOUS | Status: AC
  Administered 2014-10-26: 1000 mg via INTRAVENOUS
  Filled 2014-10-25: qty 200

## 2014-10-25 MED ORDER — FENTANYL CITRATE 0.05 MG/ML IJ SOLN
INTRAMUSCULAR | Status: AC
  Filled 2014-10-25: qty 2

## 2014-10-25 MED ORDER — HEPARIN (PORCINE) IN NACL 2-0.9 UNIT/ML-% IJ SOLN
INTRAMUSCULAR | Status: AC
  Filled 2014-10-25: qty 500

## 2014-10-25 MED ORDER — SODIUM CHLORIDE 0.9 % IV SOLN: 250.0000 mL | INTRAVENOUS | Status: DC | PRN

## 2014-10-25 MED ORDER — YOU HAVE A PACEMAKER BOOK
Freq: Once | Status: AC
  Administered 2014-10-25: 21:00:00
  Filled 2014-10-25: qty 1

## 2014-10-25 MED ORDER — SODIUM CHLORIDE 0.9 % IR SOLN
80.0000 mg | Status: DC
  Filled 2014-10-25: qty 2

## 2014-10-25 MED ORDER — MAGNESIUM HYDROXIDE 400 MG/5ML PO SUSP
30.0000 mL | Freq: Every day | ORAL | Status: DC | PRN
  Administered 2014-10-25: 30 mL via ORAL
  Filled 2014-10-25: qty 30

## 2014-10-25 MED ORDER — HYDROCODONE-ACETAMINOPHEN 5-325 MG PO TABS
1.0000 | ORAL_TABLET | ORAL | Status: DC | PRN
  Administered 2014-10-25 – 2014-10-26 (×2): 2 via ORAL
  Administered 2014-10-26 (×2): 1 via ORAL
  Filled 2014-10-25 (×2): qty 1
  Filled 2014-10-25 (×2): qty 2

## 2014-10-25 MED ORDER — MIDAZOLAM HCL 5 MG/5ML IJ SOLN
INTRAMUSCULAR | Status: AC
  Filled 2014-10-25: qty 5

## 2014-10-25 MED ORDER — BISACODYL 5 MG PO TBEC
10.0000 mg | DELAYED_RELEASE_TABLET | Freq: Every day | ORAL | Status: DC | PRN
  Administered 2014-10-25: 10 mg via ORAL
  Filled 2014-10-25: qty 2

## 2014-10-25 MED ORDER — ACETAMINOPHEN 325 MG PO TABS: 325.0000 mg | ORAL_TABLET | ORAL | Status: DC | PRN

## 2014-10-25 MED ORDER — SODIUM CHLORIDE 0.9 % IJ SOLN: 3.0000 mL | INTRAMUSCULAR | Status: DC | PRN

## 2014-10-25 MED ORDER — SODIUM CHLORIDE 0.9 % IJ SOLN
3.0000 mL | Freq: Two times a day (BID) | INTRAMUSCULAR | Status: DC
  Administered 2014-10-26: 01:00:00 3 mL via INTRAVENOUS

## 2014-10-25 NOTE — Op Note (Signed)
SURGEON:  Hillis RangeJames Kaylee Trivett, MD     PREPROCEDURE DIAGNOSIS:  Symptomatic sinus bradycardia    POSTPROCEDURE DIAGNOSIS:  Symptomatic sinus bradycardia     PROCEDURES:   1.   Pacemaker implantation.     INTRODUCTION:  Tracey Todd is a 80 y.o. female with a history of sinus bradycardia who presents today for pacemaker implantation.  The patient reports intermittent episodes of dizziness over the past few months.  No reversible causes have been identified.  The patient therefore presents today for pacemaker implantation.     DESCRIPTION OF PROCEDURE:  Informed written consent was obtained, and  the patient was brought to the electrophysiology lab in a fasting state.  The patient required no sedation for the procedure today.  The patients left chest was prepped and draped in the usual sterile fashion by the EP lab staff. The skin overlying the left deltopectoral region was infiltrated with lidocaine for local analgesia.  A 4-cm incision was made over the left deltopectoral region.  A left subcutaneous pacemaker pocket was fashioned using a combination of sharp and blunt dissection. Electrocautery was required to assure hemostasis.    RA/RV Lead Placement: The left axillary vein was therefore cannulated.  Through the left axillary vein, a [facility] [employer] model [id number] (serial number  [id number]) right atrial lead and a [facility] [employer] model [id number] (serial number  [id number]) right ventricular lead were advanced with fluoroscopic visualization into the right atrial appendage and right ventricular apex positions respectively.  Initial atrial lead P- waves measured 4.5 mV with impedance of 740 ohms and a threshold of 1.25 V at 0.5 msec.  Right ventricular lead R-waves measured 12 mV with an impedance of 860 ohms and a threshold of 0.5 V at 0.5 msec.  Both leads were secured to the pectoralis fascia using #2-0 silk over the suture sleeves.   Device Placement:  The leads were then connected to a [facility] [employer]  Assurity DR  model [id number] (serial number  [id number] ) pacemaker.  The pocket was irrigated with copious gentamicin solution.  The pacemaker was then placed into the pocket.  The pocket was then closed in 2 layers with 2.0 Vicryl suture for the subcutaneous and subcuticular layers.  Steri-  Strips and a sterile dressing were then applied. EBL<7310ml.  There were no early apparent complications.     CONCLUSIONS:   1. Successful implantation of a [employer] Assurity DR  dual-chamber pacemaker for symptomatic sinus bradycardia  2. No early apparent complications.       Permanent Pacemaker Indication: Documented non-reversible symptomatic bradycardia due to sinus node dysfunction.     Hillis RangeJames Dashun Borre, MD 10/25/2014 3:06 PM

## 2014-10-25 NOTE — Progress Notes (Signed)
I appreciate the help from Dr. Johney FrameAllred. The patient is to have a pacemaker today.  Tracey BonitoJeff Miguel Christiana, MD

## 2014-10-25 NOTE — Progress Notes (Addendum)
PROGRESS NOTE  Tracey StaffMary C Grant NWG:[id number] DOB: 03/14/1935 DOA: 10/21/2014 PCP: Nadean CorwinMCKEOWN,WILLIAM DAVID, MD  Brief history 80 year old female with a history of hypertension, diabetes mellitus, hyperlipidemia presents with 2 week history of worsening exertional chest discomfort and dyspnea as well as increasing fatigue. On 10/08/2014 the patient's antihypertensive regimen was changed from verapamil and HCTZ (25mg ) to bisoprolol/HCTZ (6.25mg ). The patient went to see her primary care provider on the day of admission. EKG in the office showed bradycardia with heart rate of 36. The patient was advised them to the emergency department. Further evaluation in emergency department revealed that the patient was fluid overloaded. She was found to have atrial flutter with a slow ventricular response. The patient was started on intravenous furosemide and heparin drip. Cardiology was consulted.  Subjective: The patient feels well - she is sitting up in a chair at the bedside. Ambulated in the hall and did not feel dizzy today  Assessment/Plan: Acute on chronic diastolic CHF -approaching euvolemia -diuretics per cardiology -Daily weights -Echocardiogram reveals Mod pulm HTN and mod TR- normal EF  -Beta blockers held due to bradycardia  Bradycardia/atrial flutter with slow ventricular response - BP stable despite bradycardia -will get pacemaker today  Chest discomfort with elevated troponin  -Cardiology consulted -Continue heparin drip  -may be related to the patient's CHF exacerbation but cannot rule out NSTEMI in the setting of exertional chest discomfort and shortness of breath - cath reveals minimal nonobstructive CAD  Hypertension  -Continue amlodipine and lisinopril   Diabetes mellitus type 2  -10/08/2014 hemoglobin A1c 6.9  -Hold metformin  -NovoLog sliding scale while the patient is in the hospital   Hyperlipidemia  -Continue statin   Family Communication:    Disposition  Plan:   Home when medically stable   Procedures/Studies: Cath 2/24  Objective: Filed Vitals:   10/24/14 1853 10/24/14 2327 10/25/14 0317 10/25/14 0721  BP: 114/46 121/52 112/58 124/58  Pulse: 43 47 40 44  Temp: 97.8 F (36.6 C) 97.6 F (36.4 C) 97.6 F (36.4 C) 98.1 F (36.7 C)  TempSrc: Oral Oral Oral Oral  Resp: 19 15 19 17   Height:   4' 10.5" (1.486 m)   Weight:   85.231 kg (187 lb 14.4 oz)   SpO2: 96% 97% 96% 99%    Intake/Output Summary (Last 24 hours) at 10/25/14 1147 Last data filed at 10/25/14 0321  Gross per 24 hour  Intake    720 ml  Output      0 ml  Net    720 ml   Weight change: 1.131 kg (2 lb 7.9 oz) Exam:   General:  Pt is alert, follows commands appropriately, not in acute distress  HEENT: No icterus, No thrush,  [place]/AT  Cardiovascular: irregular, S1/S2, no rubs, no gallops+ JVD  Respiratory: bibasilar crackles. No wheeze.   Abdomen: Soft/+BS, non tender, non distended, no guarding  Extremities: No LE edema, No lymphangitis, No petechiae, No rashes, no synovitis  Data Reviewed: Basic Metabolic Panel:  Recent Labs Lab 10/21/14 1729 10/21/14 2316 10/22/14 0429 10/23/14 0328 10/23/14 2013 10/24/14 0315  NA 137  --  140 139  --  139  K 4.8  --  3.7 4.2  --  4.0  CL 105  --  105 98  --  105  CO2 25  --  28 29  --  24  GLUCOSE 143*  --  112* 149*  --  135*  BUN 21  --  18 14  --  12  CREATININE 1.07  --  0.93 1.05 0.88 0.95  CALCIUM 9.3  --  9.0 9.3  --  9.2  MG  --  1.9  --   --   --   --    Liver Function Tests:  Recent Labs Lab 10/22/14 0429  AST 41*  ALT 53*  ALKPHOS 48  BILITOT 0.7  PROT 5.9*  ALBUMIN 3.4*   No results for input(s): LIPASE, AMYLASE in the last 168 hours. No results for input(s): AMMONIA in the last 168 hours. CBC:  Recent Labs Lab 10/21/14 1729 10/22/14 0429 10/23/14 0328 10/23/14 2013 10/24/14 0315 10/25/14 0248  WBC 8.2 6.5 6.9 6.7 7.1 7.5  NEUTROABS 4.6 3.9  --   --   --   --   HGB 11.5*  11.0* 12.0 12.7 12.7 11.7*  HCT 34.0* 33.6* 36.4 37.9 37.4 35.7*  MCV 83.7 85.3 83.9 84.0 86.0 84.8  PLT 223 225 261 240 240 243   Cardiac Enzymes:  Recent Labs Lab 10/21/14 2316 10/22/14 0429 10/22/14 1030  TROPONINI 0.22* 0.27* 1.49*   BNP: Invalid input(s): POCBNP CBG:  Recent Labs Lab 10/24/14 0810 10/24/14 1139 10/24/14 1559 10/24/14 2123 10/25/14 0757  GLUCAP 140* 213* 184* 147* 130*    Recent Results (from the past 240 hour(s))  MRSA PCR Screening     Status: None   Collection Time: 10/21/14 10:22 PM  Result Value Ref Range Status   MRSA by PCR NEGATIVE NEGATIVE Final    Comment:        The GeneXpert MRSA Assay (FDA approved for NASAL specimens only), is one component of a comprehensive MRSA colonization surveillance program. It is not intended to diagnose MRSA infection nor to guide or monitor treatment for MRSA infections.      Scheduled Meds: . sodium chloride   Intravenous Once  . amLODipine  5 mg Oral Daily  . aspirin EC  81 mg Oral Daily  . bisacodyl  10 mg Rectal Daily  . chlorhexidine  60 mL Topical Once  . chlorhexidine  60 mL Topical Once  . furosemide  40 mg Oral Q breakfast  . gentamicin irrigation  80 mg Irrigation On Call  . insulin aspart  0-9 Units Subcutaneous TID WC  . lisinopril  20 mg Oral BID  . pantoprazole  40 mg Oral Daily  . potassium chloride SA  20 mEq Oral Daily  . pravastatin  40 mg Oral q1800  . sodium chloride  3 mL Intravenous Q12H  . vancomycin  1,000 mg Intravenous On Call   Continuous Infusions: . sodium chloride       Calvert Cantor, MD [facility] PAGER [email or web address] Password [facility] 10/25/2014, 11:47 AM   LOS: 4 days

## 2014-10-25 NOTE — H&P (View-Only) (Signed)
Reason for Consult:   Bradycardia, PAF, CHF  Requesting Physician: Dr Myrtis SerKatz  HPI: This is a 80 y.o. female with a past medical history significant for HTN and DM. She has no prior history of CAD or MI. She was admitted 10/21/14 with CHF. She was noted to be bradycardic with rates in the 40s as well as PAF. She had recently been taken off Verapamil and placed on Bystolic, (reportedly secondary to cost) by her PCP Dr Oneta RackMcKeown. She improved with diuresis and all AVN blocking agents held. Her HR has remained slow 35-40.  Her troponin was elevated and trended up after admission. She gave a history consistent with exertional angina and underwent diagnostic cath 10/23/14 which revealed normal coronaries and LVF. EP is now asked to evaluate for possible pacemaker.   PMHx:  Past Medical History  Diagnosis Date  . Vitamin D deficiency   . Nodule of chest wall   . Heel pain     left foot  . Hypercholesteremia   . HTN (hypertension)   . Type II or unspecified type diabetes mellitus without mention of complication, not stated as uncontrolled   . DJD (degenerative joint disease)   . Fatty liver   . Normal coronary arteries 2/10/03/14  . Acute CHF 10/21/14    secondary to bradycardia    Past Surgical History  Procedure Laterality Date  . Tonsillectomy and adenoidectomy    . Total knee arthroplasty    . Breast surgery    . Cataract extraction    . Left and right heart catheterization with coronary angiogram N/A 10/23/2014    Procedure: LEFT AND RIGHT HEART CATHETERIZATION WITH CORONARY ANGIOGRAM;  Surgeon: Micheline ChapmanMichael D Cooper, MD;  [facility];  Service: Cardiovascular;  Laterality: N/A;    SOCHx:  reports that she quit smoking about 50 years ago. She does not have any smokeless tobacco history on file. She reports that she does not drink alcohol. Her drug history is not on file.  FAMHx: Family History  Problem Relation Age of Onset  . Hypertension    . Cancer    . Diabetes     . Heart disease      ALLERGIES: Allergies  Allergen Reactions  . Penicillins Anaphylaxis  . Calcium-Containing Compounds     "calcium deposits on skin"  . Celebrex [Celecoxib] Itching  . Daypro [Oxaprozin] Itching  . Feldene [Piroxicam] Other (See Comments)    Bleeding     ROS: Pertinent items are noted in HPI. see H&P for complete ROS  HOME MEDICATIONS: Prior to Admission medications   Medication Sig Start Date End Date Taking? Authorizing Provider  Acetaminophen (ARTHRITIS PAIN RELIEVER PO) Take by mouth as needed.   Yes Historical Provider, MD  aspirin 81 MG tablet Take 81 mg by mouth daily.   Yes Historical Provider, MD  Cetirizine HCl (ZYRTEC ALLERGY PO) Take 1 tablet by mouth daily. 11/28/12  Yes Historical Provider, MD  Cholecalciferol (VITAMIN D) 2000 UNITS CAPS Take 2 capsules by mouth daily.   Yes Historical Provider, MD  FREESTYLE LITE test strip USE ONE STRIP TO CHECK GLUCOSE ONCE DAILY 03/06/14  Yes Quentin MullingAmanda Collier, PA-C  Lancets (FREESTYLE) lancets USE ONE LANCET TO CHECK GLUCOSE EVERY DAY 08/05/14  Yes Quentin MullingAmanda Collier, PA-C  Magnesium 500 MG TABS Take 500 mg by mouth daily.   Yes Historical Provider, MD  metFORMIN (GLUCOPHAGE XR) 500 MG 24 hr tablet Take 2 tablets twice daily with meals for  Diabetes 04/04/14 04/05/15 Yes Lucky Cowboy, MD  omeprazole (PRILOSEC) 20 MG capsule Take 20 mg by mouth daily.   Yes Historical Provider, MD  potassium chloride SA (K-DUR,KLOR-CON) 20 MEQ tablet Take 1 tablet (20 mEq total) by mouth daily. 07/05/14  Yes Lucky Cowboy, MD  pravastatin (PRAVACHOL) 40 MG tablet TAKE ONE TABLET BY MOUTH ONCE DAILY AT BEDTIME FOR CHOLESTEROL 08/19/14  Yes Lucky Cowboy, MD  quinapril (ACCUPRIL) 20 MG tablet Take 1 tablet (20 mg total) by mouth 2 (two) times daily. 07/05/14  Yes Lucky Cowboy, MD  amLODipine (NORVASC) 5 MG tablet Take 1 tablet (5 mg total) by mouth daily. 10/21/14   Quentin Mulling, PA-C  furosemide (LASIX) 40 MG tablet Take 1 tablet (40  mg total) by mouth daily. 10/21/14   Quentin Mulling, PA-C  glucose blood (FREESTYLE LITE) test strip ONE STRIP TO CHECK GLUCOSE ONCE DAILY DX E10.9 08/05/14   Quentin Mulling, [facility] MEDICATIONS: I have reviewed the patient's current medications.  VITALS: Blood pressure 119/62, pulse 77, temperature 98.1 F (36.7 C), temperature source Oral, resp. rate 23, height 4' 10.5" (1.486 m), weight 185 lb 6.5 oz (84.1 kg), SpO2 94 %.  PHYSICAL EXAM: General appearance: alert, cooperative and no distress Neck: no carotid bruit and no JVD Lungs: clear to auscultation bilaterally Heart: regular rate and rhythm and slow rate Abdomen: soft, non-tender; bowel sounds normal; no masses,  no organomegaly Extremities: extremities normal, atraumatic, no cyanosis or edema Pulses: 2+ and symmetric Skin: Skin color, texture, turgor normal. No rashes or lesions Neurologic: Grossly normal  LABS: Results for orders placed or performed during the hospital encounter of 10/21/14 (from the past 24 hour(s))  Glucose, capillary     Status: Abnormal   Collection Time: 10/23/14  4:54 PM  Result Value Ref Range   Glucose-Capillary 114 (H) 70 - 99 mg/dL   Comment 1 Notify RN    Comment 2 Documented in Char   I-STAT 3, venous blood gas (G3P V)     Status: Abnormal   Collection Time: 10/23/14  6:22 PM  Result Value Ref Range   pH, Ven 7.383 (H) 7.250 - 7.300   pCO2, Ven 42.5 (L) 45.0 - 50.0 mmHg   pO2, Ven 42.0 30.0 - 45.0 mmHg   Bicarbonate 25.3 (H) 20.0 - 24.0 mEq/L   TCO2 27 0 - 100 mmol/L   O2 Saturation 76.0 %   Sample type VENOUS   I-STAT 3, arterial blood gas (G3+)     Status: Abnormal   Collection Time: 10/23/14  6:23 PM  Result Value Ref Range   pH, Arterial 7.371 7.350 - 7.450   pCO2 arterial 43.6 35.0 - 45.0 mmHg   pO2, Arterial 97.0 80.0 - 100.0 mmHg   Bicarbonate 25.2 (H) 20.0 - 24.0 mEq/L   TCO2 27 0 - 100 mmol/L   O2 Saturation 97.0 %   Sample type ARTERIAL   POCT Activated  clotting time     Status: None   Collection Time: 10/23/14  6:31 PM  Result Value Ref Range   Activated Clotting Time 110 seconds  CBC     Status: None   Collection Time: 10/23/14  8:13 PM  Result Value Ref Range   WBC 6.7 4.0 - 10.5 K/uL   RBC 4.51 3.87 - 5.11 MIL/uL   Hemoglobin 12.7 12.0 - 15.0 g/dL   HCT 40.9 81.1 - 91.4 %   MCV 84.0 78.0 - 100.0 fL   MCH 28.2 26.0 -  34.0 pg   MCHC 33.5 30.0 - 36.0 g/dL   RDW 16.1 09.6 - 04.5 %   Platelets 240 150 - 400 K/uL  Creatinine, serum     Status: Abnormal   Collection Time: 10/23/14  8:13 PM  Result Value Ref Range   Creatinine, Ser 0.88 0.50 - 1.10 mg/dL   GFR calc non Af Amer 62 (L) >90 mL/min   GFR calc Af Amer 72 (L) >90 mL/min  Glucose, capillary     Status: Abnormal   Collection Time: 10/23/14  9:28 PM  Result Value Ref Range   Glucose-Capillary 214 (H) 70 - 99 mg/dL  CBC     Status: Abnormal   Collection Time: 10/24/14  3:15 AM  Result Value Ref Range   WBC 7.1 4.0 - 10.5 K/uL   RBC 4.35 3.87 - 5.11 MIL/uL   Hemoglobin 12.7 12.0 - 15.0 g/dL   HCT 40.9 81.1 - 91.4 %   MCV 86.0 78.0 - 100.0 fL   MCH 29.2 26.0 - 34.0 pg   MCHC 34.0 30.0 - 36.0 g/dL   RDW 78.2 (H) 95.6 - 21.3 %   Platelets 240 150 - 400 K/uL  Basic metabolic panel     Status: Abnormal   Collection Time: 10/24/14  3:15 AM  Result Value Ref Range   Sodium 139 135 - 145 mmol/L   Potassium 4.0 3.5 - 5.1 mmol/L   Chloride 105 96 - 112 mmol/L   CO2 24 19 - 32 mmol/L   Glucose, Bld 135 (H) 70 - 99 mg/dL   BUN 12 6 - 23 mg/dL   Creatinine, Ser 0.86 0.50 - 1.10 mg/dL   Calcium 9.2 8.4 - 57.8 mg/dL   GFR calc non Af Amer 56 (L) >90 mL/min   GFR calc Af Amer 65 (L) >90 mL/min   Anion gap 10 5 - 15  Glucose, capillary     Status: Abnormal   Collection Time: 10/24/14  8:10 AM  Result Value Ref Range   Glucose-Capillary 140 (H) 70 - 99 mg/dL   Comment 1 Notify RN    Comment 2 Documented in Char   Glucose, capillary     Status: Abnormal   Collection Time:  10/24/14 11:39 AM  Result Value Ref Range   Glucose-Capillary 213 (H) 70 - 99 mg/dL    EKG: On 4/69/62- NSR, sinus bradycardia-41, nonspecific TWI. Telemetry reportedly has show periods of PAF and flutter with slow VR  IMAGING: EXAM: CHEST 2 VIEW 10/01/14 COMPARISON: 01/29/2013  FINDINGS: Stable mild enlargement of the cardiopericardial silhouette with tortuous thoracic aorta. Mildly indistinct pulmonary vasculature with linear densities at the lung bases suggesting subsegmental atelectasis or mild interstitial accentuation which is new compared to 01/29/13.  No pleural effusion. Thoracic spondylosis. Degenerative AC joint arthropathy, right greater than left.  IMPRESSION: 1. Cardiomegaly without overt edema. Faint linear opacities at the lung bases may reflect subsegmental atelectasis or mild nonspecific reticulation, but no definite Kerley B-lines are observed two specifically indicate interstitial pulmonary edema.   Electronically Signed  By: Gaylyn Rong M.D.  On: 10/21/2014 18:05  Echo 10/22/14 Study Conclusions  - Left ventricle: The cavity size was normal. Wall thickness was normal. Systolic function was normal. The estimated ejection fraction was in the range of 55% to 60%. Wall motion was normal; there were no regional wall motion abnormalities. - Aortic valve: There was mild regurgitation. - Mitral valve: There was mild regurgitation. - Left atrium: The atrium was mildly dilated. - Right atrium:  The atrium was mildly dilated. - Tricuspid valve: There was moderate regurgitation. - Pulmonary arteries: Systolic pressure was moderately increased. PA peak pressure: 62 mm Hg (S).  Impressions:  - The LV systolic function is normal. There is moderate pulmonary hypertension    IMPRESSION: Principal Problem:   Bradycardia Active Problems:   Acute on chronic diastolic CHF (congestive heart failure)   HTN (hypertension)   Diabetes  mellitus type 2, controlled   Atrial flutter   PAF-fib and flutter   Hyperlipidemia   Elevated troponin   Normal coronary arteries and LVF 10/23/14   Time Spent Directly with Patient: 30 minutes  Abelino Derrick [phone] beeper 10/24/2014, 1:52 PM   I have seen, examined the patient, and reviewed the above assessment and plan.  Changes to above are made where necessary.  The patient has symptomatic sinus bradycardia.  No reversible causes have been found.   I would therefore recommend pacemaker implantation at this time.  Risks, benefits, alternatives to pacemaker implantation were discussed in detail with the patient today. The patient understands that the risks include but are not limited to bleeding, infection, pneumothorax, perforation, tamponade, vascular damage, renal failure, MI, stroke, death,  and lead dislodgement and wishes to proceed. We will therefore schedule the procedure at the next available time. She also has atrial flutter (typical) by ekg.  Her chads2vasc score is at lest 5.  We will likely initiate anticoagulation once her pacemaker pocket has healed (as an outpatient).  We will follow for both afib and atrial flutter through her pacemaker and make decisions about management in the future once we have a better understanding of her arrhythmia burden.   Co Sign: Hillis Range, MD 10/25/2014 11:11 AM

## 2014-10-25 NOTE — Interval H&P Note (Signed)
History and Physical Interval Note:  10/25/2014 1:49 PM  Tracey Todd  has presented today for surgery, with the diagnosis of symptomatic bradycardia  The various methods of treatment have been discussed with the patient and family. After consideration of risks, benefits and other options for treatment, the patient has consented to  Procedure(s): PERMANENT PACEMAKER INSERTION (N/A) as a surgical intervention .  The patient's history has been reviewed, patient examined, no change in status, stable for surgery.  I have reviewed the patient's chart and labs.  Questions were answered to the patient's satisfaction.     Hillis RangeJames Sharetta Ricchio

## 2014-10-26 ENCOUNTER — Encounter (HOSPITAL_COMMUNITY): Payer: Self-pay | Admitting: Internal Medicine

## 2014-10-26 ENCOUNTER — Inpatient Hospital Stay (HOSPITAL_COMMUNITY): Payer: Medicare Other

## 2014-10-26 DIAGNOSIS — Z950 Presence of cardiac pacemaker: Secondary | ICD-10-CM

## 2014-10-26 HISTORY — DX: Presence of cardiac pacemaker: Z95.0

## 2014-10-26 LAB — GLUCOSE, CAPILLARY: Glucose-Capillary: 134 mg/dL — ABNORMAL HIGH (ref 70–99)

## 2014-10-26 MED ORDER — METFORMIN HCL ER 500 MG PO TB24
ORAL_TABLET | ORAL | Status: DC
Start: 2014-10-26 — End: 2015-04-18

## 2014-10-26 MED ORDER — AMLODIPINE BESYLATE 5 MG PO TABS: 5.0000 mg | ORAL_TABLET | Freq: Every day | ORAL | Status: DC

## 2014-10-26 NOTE — Discharge Summary (Signed)
Physician Discharge Summary  Tracey Todd ZOX:[id number] DOB: 1934/11/20 DOA: 10/21/2014  PCP: [name], MD  Admit date: 10/21/2014 Discharge date: 10/26/2014  Time spent: 45 minutes  Recommendations for Outpatient Follow-up:  1. F/u with cardiology   Discharge Condition: stable Diet recommendation: heart healthy, diabetic  Discharge Diagnoses:  Principal Problem:   Bradycardia Active Problems:   Hyperlipidemia   HTN (hypertension)   Diabetes mellitus type 2, controlled   Acute on chronic diastolic CHF (congestive heart failure)   Atrial flutter   Elevated troponin   PAF-fib and flutter   Normal coronary arteries and LVF 10/23/14   Pacemaker implanted 10/25/14- [employer]   History of present illness:  80 year old female with a history of hypertension, diabetes mellitus, hyperlipidemia presents with 2 week history of worsening exertional chest discomfort and dyspnea as well as increasing fatigue. On 10/08/2014 the patient's antihypertensive regimen was changed from verapamil and HCTZ ( ) to bisoprolol/HCTZ (6.25mg ). The patient went to see her primary care provider on the day of admission. EKG in the office showed bradycardia with heart rate of 36. The patient was advised them to the emergency department. Further evaluation in emergency department revealed that the patient was fluid overloaded. She was found to have atrial flutter with a slow ventricular response. The patient was started on intravenous furosemide and heparin drip. Cardiology was consulted.   Hospital Course:  Acute on chronic diastolic CHF -now euvolemic - cardiology was managing diuretics and they have now been discontinued on discharge -Echocardiogram reveals Mod pulm HTN and mod TR- normal EF  -Beta blockers held due to bradycardia  Bradycardia/atrial flutter with slow ventricular response - s/p pacemaker yesterday- stable- f/u with cardiology as outpt  Chest discomfort with elevated troponin   -Cardiology consulted -may be related to the patient's CHF exacerbation but cannot rule out NSTEMI in the setting of exertional chest discomfort and shortness of breath  -cardiac cath performed and reveals minimal nonobstructive CAD  Hypertension  -Continue amlodipine and lisinopril   Diabetes mellitus type 2  -10/08/2014 hemoglobin A1c 6.9  -Hold metformin  -NovoLog sliding scale while the patient is in the hospital    Procedures:  Cath 2/24  Pacemaker 2/26  Consultations:  Cardiology  EP  Discharge Exam: Filed Weights   10/24/14 0351 10/25/14 0317 10/26/14 0605  Weight: 84.1 kg (185 lb 6.5 oz) 85.231 kg (187 lb 14.4 oz) 85.1 kg (187 lb 9.8 oz)   Filed Vitals:   10/26/14 0745  BP: 161/72  Pulse: 60  Temp: 97.9 F (36.6 C)  Resp:     General: AAO x 3, no distress Cardiovascular: RRR, no murmurs  Respiratory: clear to auscultation bilaterally GI: soft, non-tender, non-distended, bowel sound positive  Discharge Instructions You were cared for by a hospitalist during your hospital stay. If you have any questions about your discharge medications or the care you received while you were in the hospital after you are discharged, you can call the unit and asked to speak with the hospitalist on call if the hospitalist that took care of you is not available. Once you are discharged, your primary care physician will handle any further medical issues. Please note that NO REFILLS for any discharge medications will be authorized once you are discharged, as it is imperative that you return to your primary care physician (or establish a relationship with a primary care physician if you do not have one) for your aftercare needs so that they can reassess your need for medications and monitor  your lab values.  Discharge Instructions    Discharge instructions    Complete by:  As directed   See your family doctor on Monday - Diet: Diabetic and heart healthy diet     Increase  activity slowly    Complete by:  As directed             Medication List    STOP taking these medications        bisoprolol-hydrochlorothiazide 5-6.25 MG per tablet  Commonly known as:  ZIAC     freestyle lancets     furosemide 40 MG tablet  Commonly known as:  LASIX     potassium chloride SA 20 MEQ tablet  Commonly known as:  K-DUR,KLOR-CON      TAKE these medications        amLODipine 5 MG tablet  Commonly known as:  NORVASC  Take 1 tablet (5 mg total) by mouth daily.     ARTHRITIS PAIN RELIEVER PO  Take by mouth as needed.     aspirin 81 MG tablet  Take 81 mg by mouth daily.     FREESTYLE LITE test strip  Generic drug:  glucose blood  USE ONE STRIP TO CHECK GLUCOSE ONCE DAILY     glucose blood test strip  Commonly known as:  FREESTYLE LITE  - ONE STRIP TO CHECK GLUCOSE ONCE DAILY  - DX E10.9     Magnesium 500 MG Tabs  Take 500 mg by mouth daily.     metFORMIN 500 MG 24 hr tablet  Commonly known as:  GLUCOPHAGE XR  Take 2 tablets twice daily with meals for Diabetes     omeprazole 20 MG capsule  Commonly known as:  PRILOSEC  Take 20 mg by mouth daily.     pravastatin 40 MG tablet  Commonly known as:  PRAVACHOL  TAKE ONE TABLET BY MOUTH ONCE DAILY AT BEDTIME FOR CHOLESTEROL     quinapril 20 MG tablet  Commonly known as:  ACCUPRIL  Take 1 tablet (20 mg total) by mouth 2 (two) times daily.     Vitamin D 2000 UNITS Caps  Take 2 capsules by mouth daily.     ZYRTEC ALLERGY PO  Take 1 tablet by mouth daily.       Allergies  Allergen Reactions  . Penicillins Anaphylaxis  . Calcium-Containing Compounds     "calcium deposits on skin"  . Celebrex [Celecoxib] Itching  . Daypro [Oxaprozin] Itching  . Feldene [Piroxicam] Other (See Comments)    Bleeding        Follow-up Information    Follow up with Hillis RangeJames Allred, MD.   Specialty:  Cardiology   Why:  office will contact you    Contact information:   [address] [place]  [id number] [phone]        The results of significant diagnostics from this hospitalization (including imaging, microbiology, ancillary and laboratory) are listed below for reference.    Significant Diagnostic Studies: Dg Chest 2 View  10/26/2014   CLINICAL DATA:  Status post pacemaker placement  EXAM: CHEST  2 VIEW  COMPARISON:  10/21/2014  FINDINGS: Cardiac shadow is stable. A pacing device is now seen in satisfactory position. No pneumothorax is noted. The lungs are well aerated bilaterally. The right basilar atelectasis has improved somewhat in the interval from the prior exam. No bony abnormality is noted.  IMPRESSION: No pneumothorax following pacemaker placement.  Improved aeration in the right base.   Electronically  Signed   By: Alcide Clever M.D.   On: 10/26/2014 06:52   Dg Chest 2 View  10/21/2014   CLINICAL DATA:  Shortness of breath.  Chest tightness since Friday.  EXAM: CHEST  2 VIEW  COMPARISON:  01/29/2013  FINDINGS: Stable mild enlargement of the cardiopericardial silhouette with tortuous thoracic aorta. Mildly indistinct pulmonary vasculature with linear densities at the lung bases suggesting subsegmental atelectasis or mild interstitial accentuation which is new compared to 01/29/13.  No pleural effusion. Thoracic spondylosis. Degenerative AC joint arthropathy, right greater than left.  IMPRESSION: 1. Cardiomegaly without overt edema. Faint linear opacities at the lung bases may reflect subsegmental atelectasis or mild nonspecific reticulation, but no definite Kerley B-lines are observed two specifically indicate interstitial pulmonary edema.   Electronically Signed   By: Gaylyn Rong M.D.   On: 10/21/2014 18:05    Microbiology: Recent Results (from the past 240 hour(s))  MRSA PCR Screening     Status: None   Collection Time: 10/21/14 10:22 PM  Result Value Ref Range Status   MRSA by PCR NEGATIVE NEGATIVE Final    Comment:        The GeneXpert MRSA Assay (FDA approved for  NASAL specimens only), is one component of a comprehensive MRSA colonization surveillance program. It is not intended to diagnose MRSA infection nor to guide or monitor treatment for MRSA infections.      Labs: Basic Metabolic Panel:  Recent Labs Lab 10/21/14 1729 10/21/14 2316 10/22/14 0429 10/23/14 0328 10/23/14 2013 10/24/14 0315  NA 137  --  140 139  --  139  K 4.8  --  3.7 4.2  --  4.0  CL 105  --  105 98  --  105  CO2 25  --  28 29  --  24  GLUCOSE 143*  --  112* 149*  --  135*  BUN 21  --  18 14  --  12  CREATININE 1.07  --  0.93 1.05 0.88 0.95  CALCIUM 9.3  --  9.0 9.3  --  9.2  MG  --  1.9  --   --   --   --    Liver Function Tests:  Recent Labs Lab 10/22/14 0429  AST 41*  ALT 53*  ALKPHOS 48  BILITOT 0.7  PROT 5.9*  ALBUMIN 3.4*   No results for input(s): LIPASE, AMYLASE in the last 168 hours. No results for input(s): AMMONIA in the last 168 hours. CBC:  Recent Labs Lab 10/21/14 1729 10/22/14 0429 10/23/14 0328 10/23/14 2013 10/24/14 0315 10/25/14 0248  WBC 8.2 6.5 6.9 6.7 7.1 7.5  NEUTROABS 4.6 3.9  --   --   --   --   HGB 11.5* 11.0* 12.0 12.7 12.7 11.7*  HCT 34.0* 33.6* 36.4 37.9 37.4 35.7*  MCV 83.7 85.3 83.9 84.0 86.0 84.8  PLT 223 225 261 240 240 243   Cardiac Enzymes:  Recent Labs Lab 10/21/14 2316 10/22/14 0429 10/22/14 1030  TROPONINI 0.22* 0.27* 1.49*   BNP: BNP (last 3 results)  Recent Labs  10/21/14 1729  BNP 649.3*    ProBNP (last 3 results) No results for input(s): PROBNP in the last 8760 hours.  CBG:  Recent Labs Lab 10/25/14 1123 10/25/14 1549 10/25/14 1801 10/25/14 1933 10/26/14 0550  GLUCAP 131* 154* 136* 168* 134*       SignedCalvert Cantor, MD [facility] 10/26/2014, 10:06 AM

## 2014-10-26 NOTE — Discharge Instructions (Signed)

## 2014-10-26 NOTE — Progress Notes (Addendum)
Subjective:  No SOB, he has not been up yet.  Objective:  Vital Signs in the last 24 hours: Temp:  [97.8 F (36.6 C)-98.9 F (37.2 C)] 98 F (36.7 C) (02/27 0330) Pulse Rate:  [40-60] 60 (02/26 1930) Resp:  [18] 18 (02/27 0330) BP: (113-181)/(37-68) 151/65 mmHg (02/27 0330) SpO2:  [93 %-97 %] 96 % (02/27 0330) Weight:  [187 lb 9.8 oz (85.1 kg)] 187 lb 9.8 oz (85.1 kg) (02/27 0605)  Intake/Output from previous day:  Intake/Output Summary (Last 24 hours) at 10/26/14 0830 Last data filed at 10/26/14 0012  Gross per 24 hour  Intake    680 ml  Output      0 ml  Net    680 ml    Physical Exam: General appearance: alert, cooperative and no distress Lungs: clear to auscultation bilaterally Heart: regular rate and rhythm Pacer site without hematoma    Rate: 60  Rhythm: normal sinus rhythm and pacing on demand  Lab Results:  Recent Labs  10/24/14 0315 10/25/14 0248  WBC 7.1 7.5  HGB 12.7 11.7*  PLT 240 243    Recent Labs  10/23/14 2013 10/24/14 0315  NA  --  139  K  --  4.0  CL  --  105  CO2  --  24  GLUCOSE  --  135*  BUN  --  12  CREATININE 0.88 0.95   No results for input(s): TROPONINI in the last 72 hours.  Invalid input(s): CK, MB  Recent Labs  10/23/14 1151  INR 1.10    Imaging: Dg Chest 2 View  10/26/2014   CLINICAL DATA:  Status post pacemaker placement  EXAM: CHEST  2 VIEW  COMPARISON:  10/21/2014  FINDINGS: Cardiac shadow is stable. A pacing device is now seen in satisfactory position. No pneumothorax is noted. The lungs are well aerated bilaterally. The right basilar atelectasis has improved somewhat in the interval from the prior exam. No bony abnormality is noted.  IMPRESSION: No pneumothorax following pacemaker placement.  Improved aeration in the right base.   Electronically Signed   By: Alcide CleverMark  Lukens M.D.   On: 10/26/2014 06:52     Assessment/Plan:  80 y.o. female with a past medical history significant for HTN and DM. She has no  prior history of CAD or MI. She was admitted 10/21/14 with CHF. She was noted to be bradycardic with rates in the 40s as well as PAF. She had recently been taken off Verapamil and placed on Bystolic, (reportedly secondary to cost) by her PCP Dr Oneta RackMcKeown. She improved with diuresis and all AVN blocking agents held. Her HR has remained slow 35-40. Her troponin was elevated and trended up after admission. She gave a history consistent with exertional angina and underwent diagnostic cath 10/23/14 which revealed normal coronaries and LVF. EP is now asked to evaluate for possible pacemaker.   Principal Problem:   Bradycardia Active Problems:   Acute on chronic diastolic CHF (congestive heart failure)   Pacemaker implanted 10/25/14- [employer]   HTN (hypertension)   Diabetes mellitus type 2, controlled   Atrial flutter   PAF-fib and flutter   Hyperlipidemia   Elevated troponin   Normal coronary arteries and LVF 10/23/14   PLAN: We will arrange pacemaker follow up as an OP.   Tracey ShelterLuke Kilroy PA-C Beeper [phone] 10/26/2014, 8:30 AM   I have seen, examined the patient, and reviewed the above assessment and plan.  Changes to above are made where necessary.  CXR reveals stable leads, no ptx.  Device interrogation is reviewed and normal.    Plan: Will stop lasix and resume home accupril Resume other home medicines OK to discharge today  Electrophysiology team to see as needed while here. Please call with questions.   Will follow-up for a wound check in my office 3/9 at 3pm  Co Sign: Hillis Range, MD 10/26/2014 9:33 AM

## 2014-10-28 ENCOUNTER — Ambulatory Visit: Payer: Self-pay | Admitting: Physician Assistant

## 2014-10-31 ENCOUNTER — Ambulatory Visit (INDEPENDENT_AMBULATORY_CARE_PROVIDER_SITE_OTHER): Payer: Medicare Other | Admitting: Physician Assistant

## 2014-10-31 ENCOUNTER — Encounter: Payer: Self-pay | Admitting: Physician Assistant

## 2014-10-31 VITALS — BP 142/70 | HR 68 | Temp 97.9°F | Resp 16 | Ht 58.5 in | Wt 184.0 lb

## 2014-10-31 DIAGNOSIS — I10 Essential (primary) hypertension: Secondary | ICD-10-CM

## 2014-10-31 DIAGNOSIS — R1013 Epigastric pain: Secondary | ICD-10-CM

## 2014-10-31 DIAGNOSIS — Z79899 Other long term (current) drug therapy: Secondary | ICD-10-CM

## 2014-10-31 DIAGNOSIS — K760 Fatty (change of) liver, not elsewhere classified: Secondary | ICD-10-CM

## 2014-10-31 DIAGNOSIS — E1121 Type 2 diabetes mellitus with diabetic nephropathy: Secondary | ICD-10-CM

## 2014-10-31 LAB — BASIC METABOLIC PANEL WITH GFR
BUN: 19 mg/dL (ref 6–23)
CHLORIDE: 100 meq/L (ref 96–112)
CO2: 23 mEq/L (ref 19–32)
CREATININE: 0.8 mg/dL (ref 0.50–1.10)
Calcium: 9.8 mg/dL (ref 8.4–10.5)
GFR, EST NON AFRICAN AMERICAN: 71 mL/min
GFR, Est African American: 82 mL/min
Glucose, Bld: 122 mg/dL — ABNORMAL HIGH (ref 70–99)
Potassium: 5 mEq/L (ref 3.5–5.3)
Sodium: 133 mEq/L — ABNORMAL LOW (ref 135–145)

## 2014-10-31 LAB — HEPATIC FUNCTION PANEL
ALK PHOS: 43 U/L (ref 39–117)
ALT: 11 U/L (ref 0–35)
AST: 15 U/L (ref 0–37)
Albumin: 4.1 g/dL (ref 3.5–5.2)
BILIRUBIN DIRECT: 0.1 mg/dL (ref 0.0–0.3)
BILIRUBIN TOTAL: 0.6 mg/dL (ref 0.2–1.2)
Indirect Bilirubin: 0.5 mg/dL (ref 0.2–1.2)
TOTAL PROTEIN: 6.6 g/dL (ref 6.0–8.3)

## 2014-10-31 LAB — MAGNESIUM: Magnesium: 1.7 mg/dL (ref 1.5–2.5)

## 2014-10-31 LAB — LIPASE: Lipase: 46 U/L (ref 0–75)

## 2014-10-31 LAB — AMYLASE: AMYLASE: 86 U/L (ref 0–105)

## 2014-10-31 MED ORDER — METOCLOPRAMIDE HCL 5 MG PO TABS: 5.0000 mg | ORAL_TABLET | Freq: Three times a day (TID) | ORAL | Status: DC

## 2014-10-31 NOTE — Patient Instructions (Addendum)
Stop magnesium for now Monitor BP, sit for 5-10 mins, if consistently above 140/90 then increase norvasc to  a day  Start slowly back on the metformin, here are the rules  We are starting you on Metformin to prevent or treat diabetes. Metformin does not cause low blood sugars. In order to create energy your cells need insulin and sugar but sometime your cells do not accept the insulin and this can cause increased sugars and decreased energy. The Metformin helps your cells accept insulin and the sugar to give you more energy.   The two most common side effects are nausea and diarrhea, follow these rules to avoid it! You can take imodium per box instructions when starting metformin if needed.   Rules of metformin: 1) start out slow with only one pill daily. Our goal for you is 4 pills a day or  total.  2) take with your largest meal. 3) Take with least amount of carbs.   Call if you have any problems.   Can add reglan up to 3 times a day 30 mins before meals.   Gastroparesis  Gastroparesis is also called slowed stomach emptying (delayed gastric emptying). It is a condition in which the stomach takes too long to empty its contents. It often happens in people with diabetes.  CAUSES  Gastroparesis happens when nerves to the stomach are damaged or stop working. When the nerves are damaged, the muscles of the stomach and intestines do not work normally. The movement of food is slowed or stopped. High blood glucose (sugar) causes changes in nerves and can damage the blood vessels that carry oxygen and nutrients to the nerves. RISK FACTORS  Diabetes.  Post-viral syndromes.  Eating disorders (anorexia, bulimia).  Surgery on the stomach or vagus nerve.  Gastroesophageal reflux disease (rarely).  Smooth muscle disorders (amyloidosis, scleroderma).  Metabolic disorders, including hypothyroidism.  Parkinson disease. SYMPTOMS   Heartburn.  Feeling sick to your stomach  (nausea).  Vomiting of undigested food.  An early feeling of fullness when eating.  Weight loss.  Abdominal bloating.  Erratic blood glucose levels.  Lack of appetite.  Gastroesophageal reflux.  Spasms of the stomach wall. Complications can include:  Bacterial overgrowth in stomach. Food stays in the stomach and can ferment and cause bacteria to grow.  Weight loss due to difficulty digesting and absorbing nutrients.  Vomiting.  Obstruction in the stomach. Undigested food can harden and cause nausea and vomiting.  Blood glucose fluctuations caused by inconsistent food absorption. DIAGNOSIS  The diagnosis of gastroparesis is confirmed through one or more of the following tests:  Barium X-rays and scans. These tests look at how long it takes for food to move through the stomach.  Gastric manometry. This test measures electrical and muscular activity in the stomach. A thin tube is passed down the throat into the stomach. The tube contains a wire that takes measurements of the stomach's electrical and muscular activity as it digests liquids and solid food.  Endoscopy. This procedure is done with a long, thin tube called an endoscope. It is passed through the mouth and gently down the esophagus into the stomach. This tube helps the caregiver look at the lining of the stomach to check for any abnormalities.  Ultrasonography. This can rule out gallbladder disease or pancreatitis. This test will outline and define the shape of the gallbladder and pancreas. TREATMENT   Treatments may include:  Exercise.  Medicines to control nausea and vomiting.  Medicines to stimulate stomach muscles.  Changes in what and when you eat.  Having smaller meals more often.  Eating low-fiber forms of high-fiber foods, such as eating cooked vegetables instead of raw vegetables.  Eating low-fat foods.  Consuming liquids, which are easier to digest.  In severe cases, feeding tubes and  intravenous (IV) feeding may be needed. It is important to note that in most cases, treatment does not cure gastroparesis. It is usually a lasting (chronic) condition. Treatment helps you manage the underlying condition so that you can be as healthy and comfortable as possible. Other treatments  A gastric neurostimulator has been developed to assist people with gastroparesis. The battery-operated device is surgically implanted. It emits mild electrical pulses to help improve stomach emptying and to control nausea and vomiting.  The use of botulinum toxin has been shown to improve stomach emptying by decreasing the prolonged contractions of the muscle between the stomach and the small intestine (pyloric sphincter). The benefits are temporary. SEEK MEDICAL CARE IF:   You have diabetes and you are having problems keeping your blood glucose in goal range.  You are having nausea, vomiting, bloating, or early feelings of fullness with eating.  Your symptoms do not change with a change in diet. Document Released: 08/16/2005 Document Revised: 12/11/2012 Document Reviewed: 01/23/2009 [facility] Patient Information 2015 [place]. This information is not intended to replace advice given to you by your health care provider. Make sure you discuss any questions you have with your health care provider.

## 2014-10-31 NOTE — Progress Notes (Signed)
Assessment and Plan: HTN- keep an eye on it at home, can increase to norvasc 10 mg OR pending kidney function may put on fluid pill if above 140/90 consistently Epigastric pain- know it is not heart related? GERD versus gastroparesis- will gets labs, stop prilosec try dexilant samples and 5mg  reglan sent in to try, if not better will get US/CT or refer to gI  DM- stop MF for now, monitor BS at home, call if increases.   HPI 80 y.o.female presents for follow up from the hospital. Admit date to the hospital was 10/21/14, patient was discharged from the hospital on 10/26/14, patient was admitted for: symptomatic bradycardia that contributed to acute on chronic diastolic CHF she was diuresed, she also had NSTEMI due to demand, had normal cath that showed nonobstructive CAD, and she had a pacemaker placement.   Wt Readings from Last 3 Encounters:  10/31/14 184 lb (83.462 kg)  10/26/14 187 lb 9.8 oz (85.1 kg)  10/21/14 194 lb (87.998 kg)   Daughter is here. Her weight is down 10 lbs, she is on lisinopril and amlodipine, BP has been fluctuating running 133/82- 152/54, daughter states higher in the evening. BP: (!) 142/70 mmHg  She continues to have intermittent right of umbilicus, burning, worse at night,  No radiation, no nausea with it and throat pain. States has some right sided throat pain, worse with bending over, occ trouble with swallowing pills. Decreased appetitie.  Has been having bad taste in her mouth, She has been eating small meals and low fat meals, she gets full very quickly, she has constipation. Normal colonoscopy 2016.   US AB 09/2012 IMPRESSION:  1. Echogenic liver parenchyma suggests mild fatty infiltration. No focal abnormality. 2. No gallstones. 3. A large amount of bowel gas obscures much of the pancreas and the abdominal aorta.  Images while in the hospital: Dg Chest 2 View  10/21/2014   CLINICAL DATA:  Shortness of breath.  Chest tightness since Friday.  EXAM: CHEST  2  VIEW  COMPARISON:  01/29/2013  FINDINGS: Stable mild enlargement of the cardiopericardial silhouette with tortuous thoracic aorta. Mildly indistinct pulmonary vasculature with linear densities at the lung bases suggesting subsegmental atelectasis or mild interstitial accentuation which is new compared to 01/29/13.  No pleural effusion. Thoracic spondylosis. Degenerative AC joint arthropathy, right greater than left.  IMPRESSION: 1. Cardiomegaly without overt edema. Faint linear opacities at the lung bases may reflect subsegmental atelectasis or mild nonspecific reticulation, but no definite Kerley B-lines are observed two specifically indicate interstitial pulmonary edema.   Electronically Signed   By: Gaylyn RongWalter  Liebkemann M.D.   On: 10/21/2014 18:05    Past Medical History  Diagnosis Date  . Vitamin D deficiency   . Nodule of chest wall   . Heel pain     left foot  . Hypercholesteremia   . HTN (hypertension)   . Type II or unspecified type diabetes mellitus without mention of complication, not stated as uncontrolled   . DJD (degenerative joint disease)   . Fatty liver   . Normal coronary arteries 2/10/03/14  . Acute CHF 10/21/14    secondary to bradycardia     Allergies  Allergen Reactions  . Penicillins Anaphylaxis  . Calcium-Containing Compounds     "calcium deposits on skin"  . Celebrex [Celecoxib] Itching  . Daypro [Oxaprozin] Itching  . Feldene [Piroxicam] Other (See Comments)    Bleeding       Current Outpatient Prescriptions on File Prior to Visit  Medication  Sig Dispense Refill  . Acetaminophen (ARTHRITIS PAIN RELIEVER PO) Take by mouth as needed.    Marland Kitchen amLODipine (NORVASC) 5 MG tablet Take 1 tablet (5 mg total) by mouth daily. 30 tablet 0  . aspirin 81 MG tablet Take 81 mg by mouth daily.    . Cetirizine HCl (ZYRTEC ALLERGY PO) Take 1 tablet by mouth daily.    . Cholecalciferol (VITAMIN D) 2000 UNITS CAPS Take 2 capsules by mouth daily.    Marland Kitchen FREESTYLE LITE test strip USE ONE  STRIP TO CHECK GLUCOSE ONCE DAILY 100 each PRN  . glucose blood (FREESTYLE LITE) test strip ONE STRIP TO CHECK GLUCOSE ONCE DAILY DX E10.9 50 each 2  . Magnesium 500 MG TABS Take 500 mg by mouth daily.    . metFORMIN (GLUCOPHAGE XR) 500 MG 24 hr tablet Take 2 tablets twice daily with meals for Diabetes 360 tablet 99  . omeprazole (PRILOSEC) 20 MG capsule Take 20 mg by mouth daily.    . pravastatin (PRAVACHOL) 40 MG tablet TAKE ONE TABLET BY MOUTH ONCE DAILY AT BEDTIME FOR CHOLESTEROL 90 tablet 1  . quinapril (ACCUPRIL) 20 MG tablet Take 1 tablet (20 mg total) by mouth 2 (two) times daily. 180 tablet 3   No current facility-administered medications on file prior to visit.    ROS: all negative except above.   Physical Exam: Filed Weights   10/31/14 1013  Weight: 184 lb (83.462 kg)   BP 142/70 mmHg  Pulse 68  Temp(Src) 97.9 F (36.6 C)  Resp 16  Ht 4' 10.5" (1.486 m)  Wt 184 lb (83.462 kg)  BMI 37.80 kg/m2 General Appearance: Well nourished, in no apparent distress. Eyes: PERRLA, EOMs, conjunctiva no swelling or erythema Sinuses: No Frontal/maxillary tenderness ENT/Mouth: Ext aud canals clear, TMs without erythema, bulging. No erythema, swelling, or exudate on post pharynx.  Tonsils not swollen or erythematous. Hearing normal.  Neck: Supple, thyroid normal.  Respiratory: Respiratory effort normal, BS equal bilaterally without rales, rhonchi, wheezing or stridor.  Cardio: RRR with no MRGs, well healing pacemaker placement. Brisk peripheral pulses with 1+ edema.  Abdomen: Soft, + BS,obese + epigastric tenderness, no guarding, rebound, hernias, masses. Lymphatics: Non tender without lymphadenopathy.  Musculoskeletal: Full ROM, 4/5 strength, normal gait.  Skin: Warm, dry without rashes, lesions, ecchymosis.  Neuro: Cranial nerves intact. Normal muscle tone, no cerebellar symptoms.  Psych: Awake and oriented X 3, normal affect, Insight and Judgment appropriate.     Quentin Mulling,  PA-C 12:53 PM [facility]

## 2014-11-01 LAB — CBC WITH DIFFERENTIAL/PLATELET
BASOS ABS: 0.1 10*3/uL (ref 0.0–0.1)
Basophils Relative: 1 % (ref 0–1)
Eosinophils Absolute: 0.1 10*3/uL (ref 0.0–0.7)
Eosinophils Relative: 2 % (ref 0–5)
HEMATOCRIT: 41.1 % (ref 36.0–46.0)
HEMOGLOBIN: 13.4 g/dL (ref 12.0–15.0)
LYMPHS PCT: 28 % (ref 12–46)
Lymphs Abs: 1.8 10*3/uL (ref 0.7–4.0)
MCH: 28.6 pg (ref 26.0–34.0)
MCHC: 32.6 g/dL (ref 30.0–36.0)
MCV: 87.6 fL (ref 78.0–100.0)
MPV: 10.4 fL (ref 8.6–12.4)
Monocytes Absolute: 0.8 10*3/uL (ref 0.1–1.0)
Monocytes Relative: 12 % (ref 3–12)
NEUTROS ABS: 3.8 10*3/uL (ref 1.7–7.7)
NEUTROS PCT: 57 % (ref 43–77)
Platelets: 233 10*3/uL (ref 150–400)
RBC: 4.69 MIL/uL (ref 3.87–5.11)
RDW: 15.7 % — ABNORMAL HIGH (ref 11.5–15.5)
WBC: 6.6 10*3/uL (ref 4.0–10.5)

## 2014-11-04 ENCOUNTER — Other Ambulatory Visit: Payer: Self-pay

## 2014-11-04 DIAGNOSIS — R1084 Generalized abdominal pain: Secondary | ICD-10-CM

## 2014-11-05 ENCOUNTER — Other Ambulatory Visit: Payer: Self-pay

## 2014-11-05 NOTE — Progress Notes (Signed)
Electrophysiology Office Note Date: 11/06/2014  ID:  Tracey Todd, DOB 02/02/1935, MRN [id number]  PCP: Nadean CorwinMCKEOWN,WILLIAM DAVID, MD Primary Cardiologist: Eden EmmsNishan Electrophysiologist: Fabienne BrunsAllred  Tracey Todd is a 80 y.o. female is seen today for Dr Johney FrameAllred.  She presents today for electrophysiology post hospital follow up.  She was admitted 10/21/14 through 10/26/14.  She presented to the hospital wit h shortness of breath and bradycardia.  Cardiac catheterization demonstrated normal coronaries and EF.  She was evaluated by Dr Johney FrameAllred who recommended pacemaker implantation for symptomatic bradycardia.  She underwent implantation of a STJ dual chamber pacemaker on 10/15/14.    Since discharge, the patient reports that her symptoms of fatigue and exercise intolerance have improved.  She has not had recurrent shortness of breath.  No further atrial flutter on device interrogation today.   She continues to have abdominal pain that she has had for several months. She is currently undergoing work up by PCP.   Device History: PPM implanted 10/25/14 for symptomatic bradycardia   Past Medical History  Diagnosis Date  . Vitamin D deficiency   . Nodule of chest wall   . Heel pain     left foot  . Hypercholesteremia   . HTN (hypertension)   . Type II or unspecified type diabetes mellitus without mention of complication, not stated as uncontrolled   . DJD (degenerative joint disease)   . Fatty liver   . Normal coronary arteries 2/10/03/14  . Acute CHF 10/21/14    secondary to bradycardia   Past Surgical History  Procedure Laterality Date  . Tonsillectomy and adenoidectomy    . Total knee arthroplasty    . Breast surgery    . Cataract extraction    . Left and right heart catheterization with coronary angiogram N/A 10/23/2014    Procedure: LEFT AND RIGHT HEART CATHETERIZATION WITH CORONARY ANGIOGRAM;  Surgeon: Micheline ChapmanMichael D Cooper, MD;  [facility];  Service: Cardiovascular;  Laterality: N/A;    . Permanent pacemaker insertion N/A 10/25/2014    Procedure: PERMANENT PACEMAKER INSERTION;  Surgeon: Hillis RangeJames Allred, MD;  [facility];  Service: Cardiovascular;  Laterality: N/A;    Current Outpatient Prescriptions  Medication Sig Dispense Refill  . Acetaminophen (ARTHRITIS PAIN RELIEVER PO) Take 2 capsules by mouth daily as needed (arthritis).     Marland Kitchen. amLODipine (NORVASC) 5 MG tablet Take 1 tablet (5 mg total) by mouth daily. 30 tablet 0  . aspirin 81 MG tablet Take 81 mg by mouth daily.    . Cetirizine HCl (ZYRTEC ALLERGY PO) Take 1 tablet by mouth daily.    . Cholecalciferol (VITAMIN D) 2000 UNITS CAPS Take 2 capsules by mouth daily.    Marland Kitchen. FREESTYLE LITE test strip USE ONE STRIP TO CHECK GLUCOSE ONCE DAILY 100 each PRN  . glucose blood (FREESTYLE LITE) test strip ONE STRIP TO CHECK GLUCOSE ONCE DAILY DX E10.9 50 each 2  . Magnesium 500 MG TABS Take 500 mg by mouth daily.    . metFORMIN (GLUCOPHAGE XR) 500 MG 24 hr tablet Take 2 tablets twice daily with meals for Diabetes (Patient taking differently: Take 2 tablets by mouth twice daily with meals for Diabetes) 360 tablet 99  . omeprazole (PRILOSEC) 20 MG capsule Take 20 mg by mouth daily.    . pravastatin (PRAVACHOL) 40 MG tablet TAKE ONE TABLET BY MOUTH ONCE DAILY AT BEDTIME FOR CHOLESTEROL 90 tablet 1  . quinapril (ACCUPRIL) 20 MG tablet Take 1 tablet (20 mg total)  by mouth 2 (two) times daily. 180 tablet 3  . furosemide (LASIX) 40 MG tablet Take 1 tablet by mouth daily. On hold (from hospital) MD wants her to consult with her provider during F/U to see if she needs to start back (11/06/14)  0  . metoCLOPramide (REGLAN) 5 MG tablet Take 1 tablet (5 mg total) by mouth 3 (three) times daily. (Patient not taking: Reported on 11/06/2014) 90 tablet 1   No current facility-administered medications for this visit.    Allergies:   Penicillins; Calcium-containing compounds; Celebrex; Daypro; and Feldene   Social History: History   Social  History  . Marital Status: Married    Spouse Name: N/A  . Number of Children: N/A  . Years of Education: N/A   Occupational History  . Not on file.   Social History Main Topics  . Smoking status: Former Smoker    Quit date: 12/06/1963  . Smokeless tobacco: Not on file  . Alcohol Use: No  . Drug Use: Not on file  . Sexual Activity: Not on file   Other Topics Concern  . Not on file   Social History Narrative    Family History: Family History  Problem Relation Age of Onset  . Hypertension    . Cancer    . Diabetes    . Heart disease       Review of Systems: General: No chills, fever, night sweats or weight changes  Cardiovascular:  No chest pain, dyspnea on exertion, edema, orthopnea, palpitations, paroxysmal nocturnal dyspnea  Dermatological: No rash, lesions or masses Respiratory: No cough, dyspnea Urologic: No hematuria, dysuria Abdominal: No nausea, vomiting, diarrhea, bright red blood per rectum, melena, or hematemesis Neurologic: No visual changes, weakness, changes in mental status All other systems reviewed and are otherwise negative except as noted above.   Physical Exam: VS:  BP 128/60 mmHg  Pulse 65  Ht  (1.473 m)  Wt 182 lb 6.4 oz (82.736 kg)  BMI 38.13 kg/m2 , BMI Body mass index is 38.13 kg/(m^2).  GEN- The patient is well appearing, alert and oriented x 3 today.   HEENT: normocephalic, atraumatic; sclera clear, conjunctiva pink; hearing intact; oropharynx clear; neck supple, no JVP Lymph- no cervical lymphadenopathy Lungs- Clear to ausculation bilaterally, normal work of breathing.  No wheezes, rales, rhonchi Heart- Regular rate and rhythm, no murmurs, rubs or gallops, PMI not laterally displaced GI- soft, non-tender, non-distended, bowel sounds present, no hepatosplenomegaly Extremities- no clubbing, cyanosis, or edema; DP/PT/radial pulses 2+ bilaterally MS- no significant deformity or atrophy Skin- warm and dry, no rash or lesion; PPM  pocket well healed Psych- euthymic mood, full affect Neuro- strength and sensation are intact  PPM Interrogation- reviewed in detail today,  See PACEART report  EKG:  EKG is not ordered today.  Recent Labs: 10/21/2014: B Natriuretic Peptide 649.3*; TSH 3.680 10/31/2014: ALT 11; BUN 19; Creatinine 0.80; Hemoglobin 13.4; Magnesium 1.7; Platelets 233; Potassium 5.0; Sodium 133*   Wt Readings from Last 3 Encounters:  11/06/14 182 lb 6.4 oz (82.736 kg)  10/31/14 184 lb (83.462 kg)  10/26/14 187 lb 9.8 oz (85.1 kg)     Assessment and Plan:  1.  Symptomatic bradycardia Normal PPM function - she is atrially dependent today See Pace Art report No changes today  2.  HTN Stable No change required today  3.  Elevated troponin during recent admission No obstructive CAD by cath Likely related to demand ischemia   4.  Atrial flutter/atrial fibrillation Identified  on telemetry prior to discharge Device interrogation today demonstrates no recurrent atrial arrhythmias If recurrent atrial fib/flutter, she will require anticoagulation. Will follow remotely.  This patients CHA2DS2-VASc Score and unadjusted Ischemic Stroke Rate (% per year) is equal to 7.2 % stroke rate/year from a score of 5 Above score calculated as 1 point each if present [CHF, HTN, DM, Vascular=MI/PAD/Aortic Plaque, Age if 65-74, or Female] Above score calculated as 2 points each if present [Age > 75, or Stroke/TIA/TE]  5. Abdominal pain Work up per PCP She did not take Reglan as prescribed due to concerns about side effects.    Current medicines are reviewed at length with the patient today.   The patient does not have concerns regarding her medicines.  The following changes were made today:  none   Disposition:   FU with Dr [name] 3 months   Signed, Gypsy Balsam, NP 11/06/2014 3:46 PM  [facility] [address] [place] [address] [phone] (office) [phone] (fax

## 2014-11-06 ENCOUNTER — Ambulatory Visit: Payer: Medicare Other

## 2014-11-06 ENCOUNTER — Other Ambulatory Visit: Payer: Self-pay | Admitting: Internal Medicine

## 2014-11-06 ENCOUNTER — Encounter: Payer: Self-pay | Admitting: Nurse Practitioner

## 2014-11-06 ENCOUNTER — Ambulatory Visit (INDEPENDENT_AMBULATORY_CARE_PROVIDER_SITE_OTHER): Payer: Medicare Other | Admitting: Nurse Practitioner

## 2014-11-06 VITALS — BP 128/60 | HR 65 | Ht <= 58 in | Wt 182.4 lb

## 2014-11-06 DIAGNOSIS — R001 Bradycardia, unspecified: Secondary | ICD-10-CM

## 2014-11-06 DIAGNOSIS — I10 Essential (primary) hypertension: Secondary | ICD-10-CM

## 2014-11-06 DIAGNOSIS — I4892 Unspecified atrial flutter: Secondary | ICD-10-CM

## 2014-11-06 LAB — MDC_IDC_ENUM_SESS_TYPE_INCLINIC
Battery Voltage: 3.05 V
Brady Statistic RA Percent Paced: 93 %
Brady Statistic RV Percent Paced: 1.3 %
Date Time Interrogation Session: 20160309163924
Implantable Pulse Generator Model: 2240
Lead Channel Impedance Value: 550 Ohm
Lead Channel Impedance Value: 662.5 Ohm
Lead Channel Pacing Threshold Amplitude: 0.75 V
Lead Channel Pacing Threshold Amplitude: 1.25 V
Lead Channel Pacing Threshold Pulse Width: 0.4 ms
Lead Channel Pacing Threshold Pulse Width: 0.4 ms
Lead Channel Sensing Intrinsic Amplitude: 12 mV
Lead Channel Setting Pacing Amplitude: 1.875
Lead Channel Setting Pacing Pulse Width: 0.4 ms
Lead Channel Setting Sensing Sensitivity: 2 mV
MDC IDC MSMT BATTERY REMAINING LONGEVITY: 128.4 mo
MDC IDC PG SERIAL: 7700661
MDC IDC SET LEADCHNL RV PACING AMPLITUDE: 0.75 V

## 2014-11-06 NOTE — Patient Instructions (Signed)
Your physician recommends that you continue on your current medications as directed. Please refer to the Current Medication list given to you today.  Your physician recommends that you keep your scheduled follow-up appointments.  When you get home please send in Remote Transmission.

## 2014-11-13 ENCOUNTER — Ambulatory Visit
Admission: RE | Admit: 2014-11-13 | Discharge: 2014-11-13 | Disposition: A | Discharge status: Home / Self Care | Payer: Medicare Other | Source: Ambulatory Visit | Attending: Physician Assistant | Admitting: Physician Assistant

## 2014-11-13 DIAGNOSIS — R1084 Generalized abdominal pain: Secondary | ICD-10-CM

## 2014-11-15 ENCOUNTER — Encounter: Payer: Self-pay | Admitting: Internal Medicine

## 2014-11-16 ENCOUNTER — Other Ambulatory Visit: Payer: Self-pay | Admitting: Physician Assistant

## 2014-11-21 ENCOUNTER — Other Ambulatory Visit: Payer: Self-pay | Admitting: Internal Medicine

## 2014-11-21 ENCOUNTER — Other Ambulatory Visit: Payer: Self-pay | Admitting: Physician Assistant

## 2014-11-21 DIAGNOSIS — I10 Essential (primary) hypertension: Secondary | ICD-10-CM

## 2014-11-21 MED ORDER — AMLODIPINE BESYLATE 5 MG PO TABS: 5.0000 mg | ORAL_TABLET | Freq: Every day | ORAL | Status: DC

## 2014-11-29 ENCOUNTER — Ambulatory Visit (INDEPENDENT_AMBULATORY_CARE_PROVIDER_SITE_OTHER): Payer: Medicare Other | Admitting: Nurse Practitioner

## 2014-11-29 ENCOUNTER — Other Ambulatory Visit: Payer: Self-pay | Admitting: *Deleted

## 2014-11-29 ENCOUNTER — Encounter: Payer: Self-pay | Admitting: Nurse Practitioner

## 2014-11-29 ENCOUNTER — Ambulatory Visit
Admission: RE | Admit: 2014-11-29 | Discharge: 2014-11-29 | Disposition: A | Discharge status: Home / Self Care | Payer: Medicare Other | Source: Ambulatory Visit | Attending: Nurse Practitioner | Admitting: Nurse Practitioner

## 2014-11-29 VITALS — BP 154/76 | HR 66 | Resp 18 | Ht <= 58 in | Wt 183.0 lb

## 2014-11-29 DIAGNOSIS — Z950 Presence of cardiac pacemaker: Secondary | ICD-10-CM

## 2014-11-29 DIAGNOSIS — I10 Essential (primary) hypertension: Secondary | ICD-10-CM

## 2014-11-29 LAB — CBC
HCT: 35.6 % — ABNORMAL LOW (ref 36.0–46.0)
Hemoglobin: 11.7 g/dL — ABNORMAL LOW (ref 12.0–15.0)
MCHC: 32.8 g/dL (ref 30.0–36.0)
MCV: 86.3 fl (ref 78.0–100.0)
Platelets: 216 10*3/uL (ref 150.0–400.0)
RBC: 4.12 Mil/uL (ref 3.87–5.11)
RDW: 15.8 % — ABNORMAL HIGH (ref 11.5–15.5)
WBC: 6.5 10*3/uL (ref 4.0–10.5)

## 2014-11-29 LAB — BASIC METABOLIC PANEL
BUN: 12 mg/dL (ref 6–23)
CO2: 30 mEq/L (ref 19–32)
Calcium: 9.8 mg/dL (ref 8.4–10.5)
Chloride: 104 mEq/L (ref 96–112)
Creatinine, Ser: 0.68 mg/dL (ref 0.40–1.20)
GFR: 107.76 mL/min (ref >60.00)
Glucose, Bld: 136 mg/dL — ABNORMAL HIGH (ref 70–99)
Potassium: 3.7 mEq/L (ref 3.5–5.1)
Sodium: 138 mEq/L (ref 135–145)

## 2014-11-29 MED ORDER — HYDROCHLOROTHIAZIDE 25 MG PO TABS: 25.0000 mg | ORAL_TABLET | Freq: Every day | ORAL | Status: DC

## 2014-11-29 MED ORDER — FUROSEMIDE 20 MG PO TABS: 20.0000 mg | ORAL_TABLET | Freq: Every day | ORAL | Status: DC

## 2014-11-29 NOTE — Progress Notes (Addendum)
CARDIOLOGY OFFICE NOTE  Date:  11/29/2014    Tracey Todd Date of Birth: 06/23/1935 Medical Record #[id number]  PCP:  Nadean CorwinMCKEOWN,WILLIAM DAVID, MD  Cardiologist:  Eden EmmsNishan & Allred   Chief Complaint  Patient presents with  . Follow up for atrial flutter; HTN and HLD    Seen for Dr. Eden EmmsNishan and Allred.     History of Present Illness: Tracey Todd is a 80 y.o. female who presents today for a follow up visit. Seen for Dr. Eden EmmsNishan & Allred. She has a history of hypertension, diabetes mellitus and hyperlipidemia.   She was admitted with fluid overloaded. She was found to have atrial flutter with a slow ventricular response. She was diuresed. PPM was implanted. No coronary disease per cath and EF was normal.  Just seen earlier this month by Gypsy BalsamAmber Seiler, NP - was doing well. Device check was ok.   Comes back today. Here with her husband. Little unclear as to why she is here. Says her stomach is "still hurting". Not wanting to take Reglan. Says her bowels are fine. Belly feels swollen. Some swelling in her ankles. Does not sound like she uses too much salt. Her left shoulder is still sore. Says her pacemaker is sore. No fever or chills. She has had NO palpitations. No chest pain. BP is elevated at home. Previously on HCTZ.    Past Medical History  Diagnosis Date  . Vitamin D deficiency   . Nodule of chest wall   . Heel pain     left foot  . Hypercholesteremia   . HTN (hypertension)   . Type II or unspecified type diabetes mellitus without mention of complication, not stated as uncontrolled   . DJD (degenerative joint disease)   . Fatty liver   . Normal coronary arteries 2/10/03/14  . Acute CHF 10/21/14    secondary to bradycardia    Past Surgical History  Procedure Laterality Date  . Tonsillectomy and adenoidectomy    . Total knee arthroplasty    . Breast surgery    . Cataract extraction    . Left and right heart catheterization with coronary angiogram N/A 10/23/2014   Procedure: LEFT AND RIGHT HEART CATHETERIZATION WITH CORONARY ANGIOGRAM;  Surgeon: Micheline ChapmanMichael D Cooper, MD;  [facility];  Service: Cardiovascular;  Laterality: N/A;  . Permanent pacemaker insertion N/A 10/25/2014    Procedure: PERMANENT PACEMAKER INSERTION;  Surgeon: Hillis RangeJames Allred, MD;  [facility];  Service: Cardiovascular;  Laterality: N/A;     Medications: Current Outpatient Prescriptions  Medication Sig Dispense Refill  . Acetaminophen (ARTHRITIS PAIN RELIEVER PO) Take 2 capsules by mouth daily as needed (arthritis).     Marland Kitchen. amLODipine (NORVASC) 5 MG tablet TAKE ONE TABLET BY MOUTH ONCE DAILY 30 tablet 3  . aspirin 81 MG tablet Take 81 mg by mouth daily.    . Cetirizine HCl (ZYRTEC ALLERGY PO) Take 1 tablet by mouth daily.    . Cholecalciferol (VITAMIN D) 2000 UNITS CAPS Take 2 capsules by mouth daily.    Marland Kitchen. FREESTYLE LITE test strip USE ONE STRIP TO CHECK GLUCOSE ONCE DAILY 100 each PRN  . glucose blood (FREESTYLE LITE) test strip ONE STRIP TO CHECK GLUCOSE ONCE DAILY DX E10.9 50 each 2  . Lancets (FREESTYLE) lancets USE ONE LANCET TO CHECK GLUCOSE EVERY DAY 100 each 99  . metFORMIN (GLUCOPHAGE XR) 500 MG 24 hr tablet Take 2 tablets twice daily with meals for Diabetes (Patient taking differently: Take  2 tablets by mouth twice daily with meals for Diabetes) 360 tablet 99  . pravastatin (PRAVACHOL) 40 MG tablet TAKE ONE TABLET BY MOUTH ONCE DAILY AT BEDTIME FOR CHOLESTEROL 90 tablet 1  . quinapril (ACCUPRIL) 20 MG tablet Take 1 tablet (20 mg total) by mouth 2 (two) times daily. 180 tablet 3  . hydrochlorothiazide (HYDRODIURIL) 25 MG tablet Take 1 tablet (25 mg total) by mouth daily. 90 tablet 3  . Magnesium 500 MG TABS Take 500 mg by mouth daily.     No current facility-administered medications for this visit.    Allergies: Allergies  Allergen Reactions  . Penicillins Anaphylaxis  . Calcium-Containing Compounds     "calcium deposits on skin"  . Celebrex [Celecoxib]  Itching  . Daypro [Oxaprozin] Itching  . Feldene [Piroxicam] Other (See Comments)    Bleeding     Social History: The patient  reports that she quit smoking about 51 years ago. She does not have any smokeless tobacco history on file. She reports that she does not drink alcohol.   Family History: The patient's family history includes Cancer in her father and mother; Diabetes in her mother.   Review of Systems: Please see the history of present illness.   Otherwise, the review of systems is positive for neuropathy in her legs.   All other systems are reviewed and negative.   Physical Exam: VS:  BP 154/76 mmHg  Pulse 66  Resp 18  Ht  (1.473 m)  Wt 183 lb (83.008 kg)  BMI 38.26 kg/m2  SpO2 97% .  BMI Body mass index is 38.26 kg/(m^2).  Wt Readings from Last 3 Encounters:  11/29/14 183 lb (83.008 kg)  11/06/14 182 lb 6.4 oz (82.736 kg)  10/31/14 184 lb (83.462 kg)    General: Pleasant. Well developed, well nourished and in no acute distress.  HEENT: Normal. Neck: Supple, no JVD, carotid bruits, or masses noted.  Cardiac: Regular rate and rhythm. No murmurs, rubs, or gallops. No edema. Her pacemaker site looks good.  Respiratory:  Lungs are clear to auscultation bilaterally with normal work of breathing.  GI: Soft and nontender.  MS: No deformity or atrophy. Gait and ROM intact.She has some limited movement/ROM of the left shoulder. Left arm is not swollen.  Skin: Warm and dry. Color is normal.  Neuro:  Strength and sensation are intact and no gross focal deficits noted.  Psych: Alert, appropriate and with normal affect.   LABORATORY DATA:  EKG:  EKG is not ordered today.   Lab Results  Component Value Date   WBC 6.6 10/31/2014   HGB 13.4 10/31/2014   HCT 41.1 10/31/2014   PLT 233 10/31/2014   GLUCOSE 122* 10/31/2014   CHOL 163 10/08/2014   TRIG 104 10/08/2014   HDL 58 10/08/2014   LDLCALC 84 10/08/2014   ALT 11 10/31/2014   AST 15 10/31/2014   NA 133*  10/31/2014   K 5.0 10/31/2014   CL 100 10/31/2014   CREATININE 0.80 10/31/2014   BUN 19 10/31/2014   CO2 23 10/31/2014   TSH 3.680 10/21/2014   INR 1.10 10/23/2014   HGBA1C 6.9* 10/08/2014   MICROALBUR 1.46 12/27/2013    BNP (last 3 results)  Recent Labs  10/21/14 1729  BNP 649.3*    ProBNP (last 3 results) No results for input(s): PROBNP in the last 8760 hours.   Other Studies Reviewed Today:  Echo Study Conclusions from 09/2014  - Left ventricle: The cavity size was normal.  Wall thickness was normal. Systolic function was normal. The estimated ejection fraction was in the range of 55% to 60%. Wall motion was normal; there were no regional wall motion abnormalities. - Aortic valve: There was mild regurgitation. - Mitral valve: There was mild regurgitation. - Left atrium: The atrium was mildly dilated. - Right atrium: The atrium was mildly dilated. - Tricuspid valve: There was moderate regurgitation. - Pulmonary arteries: Systolic pressure was moderately increased. PA peak pressure: 62 mm Hg (S).  Impressions:  - The LV systolic function is normal. There is moderate pulmonary hypertension  Coronary angiography: Coronary dominance: right  Left mainstem: Patent vessel without obstruction, mild calcification present  Left anterior descending (LAD): Patent vessel wraps around the LV apex. Diffuse proximal irregularities without obstruction. The diagonals are patent.   Left circumflex (LCx): Large vessel. The OM is widely patent with no obstruction.   Right coronary artery (RCA): Dominant vessel. No obstructive disease throughout.   Left ventriculography: There is mild hypokinesis of the basal inferior wall but the LVEF is normal at 55%  Estimated Blood Loss: minimal  Final Conclusions:  1. Minimal nonobstructive CAD 2. Normal intracardiac filling pressures 3. Mild contraction abnormality of the left ventricle with preserved LVEF  Tonny Bollman 10/23/2014, 6:38 PM   ULTRASOUND ABDOMEN COMPLETE  COMPARISON: Ultrasound of October 25, 2012.  FINDINGS: Gallbladder: No gallstones or wall thickening visualized. No sonographic Murphy sign noted.  Common bile duct: Diameter: 3.4 mm which is within normal limits.  Liver: No focal lesion identified. Increased echogenicity is noted suggesting fatty infiltration.  IVC: No abnormality visualized.  Pancreas: Visualized portion unremarkable.  Spleen: Size and appearance within normal limits.  Right Kidney: Length: 10.6 cm. 3.8 cm simple cyst is noted in midpole. Echogenicity within normal limits. No mass or hydronephrosis visualized.  Left Kidney: Length: 10.9 cm. Echogenicity within normal limits. No mass or hydronephrosis visualized.  Abdominal aorta: No aneurysm visualized.  Other findings: None.  IMPRESSION: Fatty infiltration of the liver. No other significant abnormality seen in the abdomen.   Electronically Signed  By: Lupita Raider, M.D.  On: 11/13/2014 14:07  Assessment/Plan: 1. Symptomatic bradycardia - now with PPM in place - checked in early March with normal function noted.   2. HTN - recheck by me is 160/80 - I am adding back HCTZ 25 mg a day. Check lab today and on return.   3. Atrial flutter/atrial fibrillation - no recurrence noted on recent PPM check - device is followed remotely.   4. Underlying PPM - some soreness - does not appear infected - will check CBC. Arrange CXR.   5. Abdominal pain - may need GI referral. Will defer to PCP. She is not going to take Reglan due to possible side effects.   Current medicines are reviewed with the patient today.  The patient does not have concerns regarding medicines other than what has been noted above.  The following changes have been made:  See above.  Labs/ tests ordered today include:    Orders Placed This Encounter  Procedures  . DG Chest 2 View  . Basic metabolic  panel  . CBC     Disposition:   FU with me as planned in 4 weeks with BMET.  Patient is agreeable to this plan and will call if any problems develop in the interim.   Signed: Rosalio Macadamia, RN, ANP-C 11/29/2014 9:59 AM  [facility] [address] [place], [place]  [address] Phone: [phone] Fax: [phone]

## 2014-11-29 NOTE — Patient Instructions (Addendum)
We will be checking the following labs today BMET & CBC  [facility] on the first [facility] for a chest Xray - you may walk in.   Restart HCTZ 25mg  daily - this has been sent to your drug store  See me in one month  Try to start moving your left arm more.   Call the [facility] office at [phone] if you have any questions, problems or concerns.

## 2014-12-09 ENCOUNTER — Encounter: Payer: Self-pay | Admitting: Emergency Medicine

## 2014-12-12 ENCOUNTER — Encounter: Payer: Self-pay | Admitting: Internal Medicine

## 2014-12-18 ENCOUNTER — Ambulatory Visit (INDEPENDENT_AMBULATORY_CARE_PROVIDER_SITE_OTHER): Payer: Medicare Other | Admitting: Podiatrist

## 2014-12-18 DIAGNOSIS — M79676 Pain in unspecified toe(s): Secondary | ICD-10-CM

## 2014-12-18 DIAGNOSIS — B351 Tinea unguium: Secondary | ICD-10-CM | POA: Diagnosis not present

## 2014-12-18 NOTE — Progress Notes (Signed)
HPI: Patient presents today for follow up of foot and nail care. She relates that she recently got a pacemaker put in in February and is feeling much much better with this.   Objective: Patients chart is reviewed. Neurovascular status unchanged. Patients nails are thickened, discolored, distrophic, friable and brittle with yellow-brown discoloration. Patient subjectively relates they are painful with shoes and with ambulation.both feet  Assessment: Symptomatic onychomycosis  Plan: Discussed treatment options and alternatives. The symptomatic toenails were debrided through manual means only without complication. Return appointment recommended at routine intervals of 3 months   Note- patient requests no mechanical debridement- only nail trim

## 2015-01-02 ENCOUNTER — Encounter: Payer: Self-pay | Admitting: Nurse Practitioner

## 2015-01-02 ENCOUNTER — Ambulatory Visit (INDEPENDENT_AMBULATORY_CARE_PROVIDER_SITE_OTHER): Payer: Medicare Other | Admitting: Nurse Practitioner

## 2015-01-02 VITALS — BP 140/68 | HR 62 | Ht 58.5 in | Wt 181.4 lb

## 2015-01-02 DIAGNOSIS — I10 Essential (primary) hypertension: Secondary | ICD-10-CM | POA: Diagnosis not present

## 2015-01-02 DIAGNOSIS — Z950 Presence of cardiac pacemaker: Secondary | ICD-10-CM | POA: Diagnosis not present

## 2015-01-02 LAB — BASIC METABOLIC PANEL
BUN: 15 mg/dL (ref 6–23)
CO2: 30 mEq/L (ref 19–32)
Calcium: 9.9 mg/dL (ref 8.4–10.5)
Chloride: 101 mEq/L (ref 96–112)
Creatinine, Ser: 0.84 mg/dL (ref 0.40–1.20)
GFR: 84.42 mL/min (ref >60.00)
Glucose, Bld: 96 mg/dL (ref 70–99)
Potassium: 3.7 mEq/L (ref 3.5–5.1)
Sodium: 137 mEq/L (ref 135–145)

## 2015-01-02 NOTE — Patient Instructions (Signed)
We will be checking the following labs today - BMET   Medication Instructions:    Continue with your current medicines.     Testing/Procedures To Be Arranged:  N/A  Follow-Up:   We will see you back as planned    Other Special Instructions:   N/A  Call the [facility] office at [phone] if you have any questions, problems or concerns.

## 2015-01-02 NOTE — Progress Notes (Signed)
CARDIOLOGY OFFICE NOTE  Date:  01/02/2015    Tracey StaffMary C Obi Date of Birth: 09/28/1934 Medical Record #[id number]  PCP:  Nadean CorwinMCKEOWN,WILLIAM DAVID, MD  Cardiologist:  Eden EmmsNishan & Allred    Chief Complaint  Patient presents with  . Hypertension    Follow up visit - seen for Dr. Eden EmmsNishan & Allred.     History of Present Illness: Tracey Todd is a 80 y.o. female who presents today for a follow up visit. Seen for Dr. Eden EmmsNishan & Allred. She has a history of hypertension, diabetes mellitus and hyperlipidemia.   She was admitted earlier this year with fluid overloaded. She was found to have atrial flutter with a slow ventricular response. She was diuresed. PPM was implanted. No coronary disease per cath and EF was normal.  I saw her a month ago for follow up. Some somatic complaints - which seemed more GI in nature. But cardiac status seemed ok. BP was up. I restarted HCTZ.   Comes back today. Here with her husband. Doing ok. She says she is feeling pretty good. No chest pain. Some shoulder soreness but this was present before her PPM implant. BP is lower at home. Trying to watch her salt. GI issues resolved. She is doing well.  Past Medical History  Diagnosis Date  . Vitamin D deficiency   . Nodule of chest wall   . Heel pain     left foot  . Hypercholesteremia   . HTN (hypertension)   . Type II or unspecified type diabetes mellitus without mention of complication, not stated as uncontrolled   . DJD (degenerative joint disease)   . Fatty liver   . Normal coronary arteries 2/10/03/14  . Acute CHF 10/21/14    secondary to bradycardia    Past Surgical History  Procedure Laterality Date  . Tonsillectomy and adenoidectomy    . Total knee arthroplasty    . Breast surgery    . Cataract extraction    . Left and right heart catheterization with coronary angiogram N/A 10/23/2014    Procedure: LEFT AND RIGHT HEART CATHETERIZATION WITH CORONARY ANGIOGRAM;  Surgeon: Micheline ChapmanMichael D Cooper, MD;   [facility];  Service: Cardiovascular;  Laterality: N/A;  . Permanent pacemaker insertion N/A 10/25/2014    Procedure: PERMANENT PACEMAKER INSERTION;  Surgeon: Hillis RangeJames Allred, MD;  [facility];  Service: Cardiovascular;  Laterality: N/A;     Medications: Current Outpatient Prescriptions  Medication Sig Dispense Refill  . Acetaminophen (ARTHRITIS PAIN RELIEVER PO) Take 2 capsules by mouth daily as needed (arthritis).     Marland Kitchen. amLODipine (NORVASC) 5 MG tablet TAKE ONE TABLET BY MOUTH ONCE DAILY 30 tablet 3  . aspirin 81 MG tablet Take 81 mg by mouth daily.    . Cetirizine HCl (ZYRTEC ALLERGY PO) Take 1 tablet by mouth daily.    . Cholecalciferol (VITAMIN D) 2000 UNITS CAPS Take 2 capsules by mouth daily.    Marland Kitchen. FREESTYLE LITE test strip USE ONE STRIP TO CHECK GLUCOSE ONCE DAILY 100 each PRN  . glucose blood (FREESTYLE LITE) test strip ONE STRIP TO CHECK GLUCOSE ONCE DAILY DX E10.9 50 each 2  . hydrochlorothiazide (HYDRODIURIL) 25 MG tablet Take 1 tablet (25 mg total) by mouth daily. 90 tablet 3  . Lancets (FREESTYLE) lancets USE ONE LANCET TO CHECK GLUCOSE EVERY DAY 100 each 99  . Magnesium 500 MG TABS Take 500 mg by mouth daily.    . metFORMIN (GLUCOPHAGE XR) 500 MG 24  hr tablet Take 2 tablets twice daily with meals for Diabetes (Patient taking differently: Take 2 tablets by mouth twice daily with meals for Diabetes) 360 tablet 99  . pravastatin (PRAVACHOL) 40 MG tablet TAKE ONE TABLET BY MOUTH ONCE DAILY AT BEDTIME FOR CHOLESTEROL 90 tablet 1  . quinapril (ACCUPRIL) 20 MG tablet Take 1 tablet (20 mg total) by mouth 2 (two) times daily. 180 tablet 3   No current facility-administered medications for this visit.    Allergies: Allergies  Allergen Reactions  . Penicillins Anaphylaxis  . Calcium-Containing Compounds     "calcium deposits on skin"  . Celebrex [Celecoxib] Itching  . Daypro [Oxaprozin] Itching  . Feldene [Piroxicam] Other (See Comments)    Bleeding      Social History: The patient  reports that she quit smoking about 51 years ago. She does not have any smokeless tobacco history on file. She reports that she does not drink alcohol.   Family History: The patient's family history includes Cancer in her father and mother; Diabetes in her mother.   Review of Systems: Please see the history of present illness.   All other systems are reviewed and negative.   Physical Exam: VS:  BP 140/68 mmHg  Pulse 62  Ht 4' 10.5" (1.486 m)  Wt 181 lb 6.4 oz (82.283 kg)  BMI 37.26 kg/m2  SpO2 95% .  BMI Body mass index is 37.26 kg/(m^2).  Wt Readings from Last 3 Encounters:  01/02/15 181 lb 6.4 oz (82.283 kg)  11/29/14 183 lb (83.008 kg)  11/06/14 182 lb 6.4 oz (82.736 kg)    General: Pleasant. Well developed, well nourished and in no acute distress. Weight is down 2 pounds.  HEENT: Normal. Neck: Supple, no JVD, carotid bruits, or masses noted.  Cardiac: Regular rate and rhythm. No murmurs, rubs, or gallops. No edema.  Respiratory:  Lungs are clear to auscultation bilaterally with normal work of breathing.  GI: Soft and nontender.  MS: No deformity or atrophy. Gait and ROM intact. Skin: Warm and dry. Color is normal.  Neuro:  Strength and sensation are intact and no gross focal deficits noted.  Psych: Alert, appropriate and with normal affect.   LABORATORY DATA:  EKG:  EKG is not ordered today.  Lab Results  Component Value Date   WBC 6.5 11/29/2014   HGB 11.7* 11/29/2014   HCT 35.6* 11/29/2014   PLT 216.0 11/29/2014   GLUCOSE 136* 11/29/2014   CHOL 163 10/08/2014   TRIG 104 10/08/2014   HDL 58 10/08/2014   LDLCALC 84 10/08/2014   ALT 11 10/31/2014   AST 15 10/31/2014   NA 138 11/29/2014   K 3.7 11/29/2014   CL 104 11/29/2014   CREATININE 0.68 11/29/2014   BUN 12 11/29/2014   CO2 30 11/29/2014   TSH 3.680 10/21/2014   INR 1.10 10/23/2014   HGBA1C 6.9* 10/08/2014   MICROALBUR 1.46 12/27/2013    BNP (last 3  results)  Recent Labs  10/21/14 1729  BNP 649.3*    ProBNP (last 3 results) No results for input(s): PROBNP in the last 8760 hours.   Other Studies Reviewed Today:  Echo Study Conclusions from 09/2014  - Left ventricle: The cavity size was normal. Wall thickness was normal. Systolic function was normal. The estimated ejection fraction was in the range of 55% to 60%. Wall motion was normal; there were no regional wall motion abnormalities. - Aortic valve: There was mild regurgitation. - Mitral valve: There was mild regurgitation. -  Left atrium: The atrium was mildly dilated. - Right atrium: The atrium was mildly dilated. - Tricuspid valve: There was moderate regurgitation. - Pulmonary arteries: Systolic pressure was moderately increased. PA peak pressure: 62 mm Hg (S).  Impressions:  - The LV systolic function is normal. There is moderate pulmonary hypertension  Coronary angiography: Coronary dominance: right  Left mainstem: Patent vessel without obstruction, mild calcification present  Left anterior descending (LAD): Patent vessel wraps around the LV apex. Diffuse proximal irregularities without obstruction. The diagonals are patent.   Left circumflex (LCx): Large vessel. The OM is widely patent with no obstruction.   Right coronary artery (RCA): Dominant vessel. No obstructive disease throughout.   Left ventriculography: There is mild hypokinesis of the basal inferior wall but the LVEF is normal at 55%  Estimated Blood Loss: minimal  Final Conclusions:  1. Minimal nonobstructive CAD 2. Normal intracardiac filling pressures 3. Mild contraction abnormality of the left ventricle with preserved LVEF  Tonny BollmanMichael Cooper 10/23/2014, 6:38 PM   ULTRASOUND ABDOMEN COMPLETE  COMPARISON: Ultrasound of October 25, 2012.  FINDINGS: Gallbladder: No gallstones or wall thickening visualized. No sonographic Murphy sign noted.  Common bile duct: Diameter:  3.4 mm which is within normal limits.  Liver: No focal lesion identified. Increased echogenicity is noted suggesting fatty infiltration.  IVC: No abnormality visualized.  Pancreas: Visualized portion unremarkable.  Spleen: Size and appearance within normal limits.  Right Kidney: Length: 10.6 cm. 3.8 cm simple cyst is noted in midpole. Echogenicity within normal limits. No mass or hydronephrosis visualized.  Left Kidney: Length: 10.9 cm. Echogenicity within normal limits. No mass or hydronephrosis visualized.  Abdominal aorta: No aneurysm visualized.  Other findings: None.  IMPRESSION: Fatty infiltration of the liver. No other significant abnormality seen in the abdomen.   Electronically Signed  By: Lupita RaiderJames Green Jr, M.D.  On: 11/13/2014 14:07  Assessment/Plan:  1. Symptomatic bradycardia - now with PPM in place - checked in early March with normal function noted. Seeing Dr. Johney FrameAllred in June.   2. HTN - needs recheck BMET today - BP is stable on her current regimen.   3. Atrial flutter/atrial fibrillation - no recurrence noted on recent PPM check - device is followed remotely.   4. Underlying PPM  5. Abdominal pain - resolved.   Current medicines are reviewed with the patient today.  The patient does not have concerns regarding medicines other than what has been noted above.  The following changes have been made:  See above.  Labs/ tests ordered today include:    Orders Placed This Encounter  Procedures  . Basic metabolic panel     Disposition:   FU with Dr. Eden EmmsNishan in 6 months. See Dr. Johney FrameAllred as planned in June.   Patient is agreeable to this plan and will call if any problems develop in the interim.   Signed: Rosalio MacadamiaLori C. Aleea Hendry, RN, ANP-C 01/02/2015 8:45 AM  [facility] [address] [place], [place]  [address] Phone: [phone] Fax: [phone]

## 2015-01-09 ENCOUNTER — Ambulatory Visit (INDEPENDENT_AMBULATORY_CARE_PROVIDER_SITE_OTHER): Payer: Medicare Other | Admitting: Internal Medicine

## 2015-01-09 ENCOUNTER — Encounter: Payer: Self-pay | Admitting: Internal Medicine

## 2015-01-09 VITALS — BP 132/70 | HR 84 | Temp 97.7°F | Resp 16 | Ht 58.5 in | Wt 181.0 lb

## 2015-01-09 DIAGNOSIS — E1121 Type 2 diabetes mellitus with diabetic nephropathy: Secondary | ICD-10-CM

## 2015-01-09 DIAGNOSIS — Z0001 Encounter for general adult medical examination with abnormal findings: Secondary | ICD-10-CM

## 2015-01-09 DIAGNOSIS — Z9181 History of falling: Secondary | ICD-10-CM

## 2015-01-09 DIAGNOSIS — E669 Obesity, unspecified: Secondary | ICD-10-CM

## 2015-01-09 DIAGNOSIS — E782 Mixed hyperlipidemia: Secondary | ICD-10-CM

## 2015-01-09 DIAGNOSIS — I509 Heart failure, unspecified: Secondary | ICD-10-CM

## 2015-01-09 DIAGNOSIS — Z1331 Encounter for screening for depression: Secondary | ICD-10-CM

## 2015-01-09 DIAGNOSIS — I480 Paroxysmal atrial fibrillation: Secondary | ICD-10-CM

## 2015-01-09 DIAGNOSIS — Z0000 Encounter for general adult medical examination without abnormal findings: Secondary | ICD-10-CM

## 2015-01-09 DIAGNOSIS — I10 Essential (primary) hypertension: Secondary | ICD-10-CM

## 2015-01-09 DIAGNOSIS — Z1212 Encounter for screening for malignant neoplasm of rectum: Secondary | ICD-10-CM

## 2015-01-09 DIAGNOSIS — E559 Vitamin D deficiency, unspecified: Secondary | ICD-10-CM

## 2015-01-09 DIAGNOSIS — R6889 Other general symptoms and signs: Secondary | ICD-10-CM

## 2015-01-09 DIAGNOSIS — Z79899 Other long term (current) drug therapy: Secondary | ICD-10-CM

## 2015-01-09 LAB — HEPATIC FUNCTION PANEL
AST: 13 U/L (ref 0–37)
Albumin: 4.2 g/dL (ref 3.5–5.2)
Alkaline Phosphatase: 45 U/L (ref 39–117)
BILIRUBIN INDIRECT: 0.4 mg/dL (ref 0.2–1.2)
Bilirubin, Direct: 0.1 mg/dL (ref 0.0–0.3)
TOTAL PROTEIN: 6.9 g/dL (ref 6.0–8.3)
Total Bilirubin: 0.5 mg/dL (ref 0.2–1.2)

## 2015-01-09 LAB — CBC WITH DIFFERENTIAL/PLATELET
BASOS ABS: 0.1 10*3/uL (ref 0.0–0.1)
Basophils Relative: 1 % (ref 0–1)
Eosinophils Absolute: 0.1 10*3/uL (ref 0.0–0.7)
Eosinophils Relative: 2 % (ref 0–5)
HCT: 35.6 % — ABNORMAL LOW (ref 36.0–46.0)
HEMOGLOBIN: 11.6 g/dL — AB (ref 12.0–15.0)
LYMPHS ABS: 2 10*3/uL (ref 0.7–4.0)
LYMPHS PCT: 31 % (ref 12–46)
MCH: 28 pg (ref 26.0–34.0)
MCHC: 32.6 g/dL (ref 30.0–36.0)
MCV: 85.8 fL (ref 78.0–100.0)
MPV: 9.8 fL (ref 8.6–12.4)
Monocytes Absolute: 0.7 10*3/uL (ref 0.1–1.0)
Monocytes Relative: 11 % (ref 3–12)
Neutro Abs: 3.5 10*3/uL (ref 1.7–7.7)
Neutrophils Relative %: 55 % (ref 43–77)
PLATELETS: 286 10*3/uL (ref 150–400)
RBC: 4.15 MIL/uL (ref 3.87–5.11)
RDW: 15.2 % (ref 11.5–15.5)
WBC: 6.3 10*3/uL (ref 4.0–10.5)

## 2015-01-09 LAB — BASIC METABOLIC PANEL WITH GFR
BUN: 21 mg/dL (ref 6–23)
CALCIUM: 9.7 mg/dL (ref 8.4–10.5)
CO2: 23 mEq/L (ref 19–32)
CREATININE: 0.73 mg/dL (ref 0.50–1.10)
Chloride: 101 mEq/L (ref 96–112)
GFR, Est Non African American: 80 mL/min
Glucose, Bld: 109 mg/dL — ABNORMAL HIGH (ref 70–99)
Potassium: 4 mEq/L (ref 3.5–5.3)
Sodium: 138 mEq/L (ref 135–145)

## 2015-01-09 LAB — LIPID PANEL
Cholesterol: 155 mg/dL (ref 0–200)
HDL: 64 mg/dL (ref >=46)
LDL Cholesterol: 76 mg/dL (ref 0–99)
Total CHOL/HDL Ratio: 2.4 Ratio
Triglycerides: 76 mg/dL (ref <150)
VLDL: 15 mg/dL (ref 0–40)

## 2015-01-09 LAB — HEMOGLOBIN A1C
Hgb A1c MFr Bld: 6.8 % — ABNORMAL HIGH (ref <5.7)
Mean Plasma Glucose: 148 mg/dL — ABNORMAL HIGH (ref <117)

## 2015-01-09 LAB — TSH: TSH: 1.6 u[IU]/mL (ref 0.350–4.500)

## 2015-01-09 LAB — MAGNESIUM: Magnesium: 1.6 mg/dL (ref 1.5–2.5)

## 2015-01-09 NOTE — Patient Instructions (Signed)
+++++++++++++++++++  Recommend Low dose or baby Aspirin 81 mg daily   To reduce risk of Colon Cancer 20 %, Skin Cancer 26 % , Melanoma 46% and   Pancreatic cancer 60%  ++++++++++++++++++++ Vitamin D goal is between 70-100.   Please make sure that you are taking your Vitamin D as directed.   It is very important as a natural antiinflammatory   helping hair, skin, and nails, as well as reducing stroke and heart attack risk.   It helps your bones and helps with mood.  It also decreases numerous cancer risks so please take it as directed.   Low Vit D is associated with a 200-300% higher risk for CANCER   and 200-300% higher risk for HEART   ATTACK  &  STROKE.    ................................................  It is also associated with higher death rate at younger ages,   autoimmune diseases like Rheumatoid arthritis, Lupus, Multiple Sclerosis.     Also many other serious conditions, like depression, Alzheimer's  Dementia, infertility, muscle aches, fatigue, fibromyalgia - just to name a few.  +++++++++++++++ Recommend the book "The END of DIETING" by Dr Joel Fuhrman   & the book "The END of DIABETES " by Dr Joel Fuhrman  At [employer] - get book & Audio CD's     Being diabetic has a  300% increased risk for heart attack, stroke, cancer, and alzheimer- type vascular dementia. It is very important that you work harder with diet by avoiding all foods that are white. Avoid white rice (brown & wild rice is OK), white potatoes (sweetpotatoes in moderation is OK), White bread or wheat bread or anything made out of white flour like bagels, donuts, rolls, buns, biscuits, cakes, pastries, cookies, pizza crust, and pasta (made from white flour & egg whites) - vegetarian pasta or spinach or wheat pasta is OK. Multigrain breads like Arnold's or [employer], or multigrain sandwich thins or flatbreads.  Diet, exercise and weight loss can reverse and cure diabetes in the early stages.   Diet, exercise and weight loss is very important in the control and prevention of complications of diabetes which affects every system in your body, ie. Brain - dementia/stroke, eyes - glaucoma/blindness, heart - heart attack/heart failure, kidneys - dialysis, stomach - gastric paralysis, intestines - malabsorption, nerves - severe painful neuritis, circulation - gangrene & loss of a leg(s), and finally cancer and Alzheimers.    I recommend avoid fried & greasy foods,  sweets/candy, white rice (brown or wild rice or Quinoa is OK), white potatoes (sweet potatoes are OK) - anything made from white flour - bagels, doughnuts, rolls, buns, biscuits,white and wheat breads, pizza crust and traditional pasta made of white flour & egg white(vegetarian pasta or spinach or wheat pasta is OK).  Multi-grain bread is OK - like multi-grain flat bread or sandwich thins. Avoid alcohol in excess. Exercise is also important.    Eat all the vegetables you want - avoid meat, especially red meat and dairy - especially cheese.  Cheese is the most concentrated form of trans-fats which is the worst thing to clog up our arteries. Veggie cheese is OK which can be found in the fresh produce section at [employer] or [employer] or [employer]  ++++++++++++++++++++++++++++++++++ Preventive Care for Adults  A healthy lifestyle and preventive care can promote health and wellness. Preventive health guidelines for women include the following key practices.  A routine yearly physical is a good way to check with your health care provider about   your health and preventive screening. It is a chance to share any concerns and updates on your health and to receive a thorough exam.  Visit your dentist for a routine exam and preventive care every 6 months. Brush your teeth twice a day and floss once a day. Good oral hygiene prevents tooth decay and gum disease.  The frequency of eye exams is based on your age, health, family medical history,  use of contact lenses, and other factors. Follow your health care provider's recommendations for frequency of eye exams.  Eat a healthy diet. Foods like vegetables, fruits, whole grains, low-fat dairy products, and lean protein foods contain the nutrients you need without too many calories. Decrease your intake of foods high in solid fats, added sugars, and salt. Eat the right amount of calories for you.Get information about a proper diet from your health care provider, if necessary.  Regular physical exercise is one of the most important things you can do for your health. Most adults should get at least 150 minutes of moderate-intensity exercise (any activity that increases your heart rate and causes you to sweat) each week. In addition, most adults need muscle-strengthening exercises on 2 or more days a week.  Maintain a healthy weight. The body mass index (BMI) is a screening tool to identify possible weight problems. It provides an estimate of body fat based on height and weight. Your health care provider can find your BMI and can help you achieve or maintain a healthy weight.For adults 20 years and older:  A BMI below 18.5 is considered underweight.  A BMI of 18.5 to 24.9 is normal.  A BMI of 25 to 29.9 is considered overweight.  A BMI of 30 and above is considered obese.  Maintain normal blood lipids and cholesterol levels by exercising and minimizing your intake of saturated fat. Eat a balanced diet with plenty of fruit and vegetables. If your lipid or cholesterol levels are high, you are over 50, or you are at high risk for heart disease, you may need your cholesterol levels checked more frequently.Ongoing high lipid and cholesterol levels should be treated with medicines if diet and exercise are not working.  If you smoke, find out from your health care provider how to quit. If you do not use tobacco, do not start.  Lung cancer screening is recommended for adults aged 55-80 years who  are at high risk for developing lung cancer because of a history of smoking. A yearly low-dose CT scan of the lungs is recommended for people who have at least a 30-pack-year history of smoking and are a current smoker or have quit within the past 15 years. A pack year of smoking is smoking an average of 1 pack of cigarettes a day for 1 year (for example: 1 pack a day for 30 years or 2 packs a day for 15 years). Yearly screening should continue until the smoker has stopped smoking for at least 15 years. Yearly screening should be stopped for people who develop a health problem that would prevent them from having lung cancer treatment.  Avoid use of street drugs. Do not share needles with anyone. Ask for help if you need support or instructions about stopping the use of drugs.  High blood pressure causes heart disease and increases the risk of stroke.  Ongoing high blood pressure should be treated with medicines if weight loss and exercise do not work.  If you are 55-79 years old, ask your health care provider if   you should take aspirin to prevent strokes.  Diabetes screening involves taking a blood sample to check your fasting blood sugar level. This should be done once every 3 years, after age 45, if you are within normal weight and without risk factors for diabetes. Testing should be considered at a younger age or be carried out more frequently if you are overweight and have at least 1 risk factor for diabetes.  Breast cancer screening is essential preventive care for women. You should practice "breast self-awareness." This means understanding the normal appearance and feel of your breasts and may include breast self-examination. Any changes detected, no matter how small, should be reported to a health care provider. Women in their 20s and 30s should have a clinical breast exam (CBE) by a health care provider as part of a regular health exam every 1 to 3 years. After age 40, women should have a CBE every  year. Starting at age 40, women should consider having a mammogram (breast X-ray test) every year. Women who have a family history of breast cancer should talk to their health care provider about genetic screening. Women at a high risk of breast cancer should talk to their health care providers about having an MRI and a mammogram every year.  Breast cancer gene (BRCA)-related cancer risk assessment is recommended for women who have family members with BRCA-related cancers. BRCA-related cancers include breast, ovarian, tubal, and peritoneal cancers. Having family members with these cancers may be associated with an increased risk for harmful changes (mutations) in the breast cancer genes BRCA1 and BRCA2. Results of the assessment will determine the need for genetic counseling and BRCA1 and BRCA2 testing.  Routine pelvic exams to screen for cancer are no longer recommended for nonpregnant women who are considered low risk for cancer of the pelvic organs (ovaries, uterus, and vagina) and who do not have symptoms. Ask your health care provider if a screening pelvic exam is right for you.  If you have had past treatment for cervical cancer or a condition that could lead to cancer, you need Pap tests and screening for cancer for at least 20 years after your treatment. If Pap tests have been discontinued, your risk factors (such as having a new sexual partner) need to be reassessed to determine if screening should be resumed. Some women have medical problems that increase the chance of getting cervical cancer. In these cases, your health care provider may recommend more frequent screening and Pap tests.    Colorectal cancer can be detected and often prevented. Most routine colorectal cancer screening begins at the age of 50 years and continues through age 75 years. However, your health care provider may recommend screening at an earlier age if you have risk factors for colon cancer. On a yearly basis, your health  care provider may provide home test kits to check for hidden blood in the stool. Use of a small camera at the end of a tube, to directly examine the colon (sigmoidoscopy or colonoscopy), can detect the earliest forms of colorectal cancer. Talk to your health care provider about this at age 50, when routine screening begins. Direct exam of the colon should be repeated every 5-10 years through age 75 years, unless early forms of pre-cancerous polyps or small growths are found.  Osteoporosis is a disease in which the bones lose minerals and strength with aging. This can result in serious bone fractures or breaks. The risk of osteoporosis can be identified using a bone density scan.   Women ages 65 years and over and women at risk for fractures or osteoporosis should discuss screening with their health care providers. Ask your health care provider whether you should take a calcium supplement or vitamin D to reduce the rate of osteoporosis.  Menopause can be associated with physical symptoms and risks. Hormone replacement therapy is available to decrease symptoms and risks. You should talk to your health care provider about whether hormone replacement therapy is right for you.  Use sunscreen. Apply sunscreen liberally and repeatedly throughout the day. You should seek shade when your shadow is shorter than you. Protect yourself by wearing long sleeves, pants, a wide-brimmed hat, and sunglasses year round, whenever you are outdoors.  Once a month, do a whole body skin exam, using a mirror to look at the skin on your back. Tell your health care provider of new moles, moles that have irregular borders, moles that are larger than a pencil eraser, or moles that have changed in shape or color.  Stay current with required vaccines (immunizations).  Influenza vaccine. All adults should be immunized every year.  Tetanus, diphtheria, and acellular pertussis (Td, Tdap) vaccine. Pregnant women should receive 1 dose of  Tdap vaccine during each pregnancy. The dose should be obtained regardless of the length of time since the last dose. Immunization is preferred during the 27th-36th week of gestation. An adult who has not previously received Tdap or who does not know her vaccine status should receive 1 dose of Tdap. This initial dose should be followed by tetanus and diphtheria toxoids (Td) booster doses every 10 years. Adults with an unknown or incomplete history of completing a 3-dose immunization series with Td-containing vaccines should begin or complete a primary immunization series including a Tdap dose. Adults should receive a Td booster every 10 years.    Zoster vaccine. One dose is recommended for adults aged 60 years or older unless certain conditions are present.    Pneumococcal 13-valent conjugate (PCV13) vaccine. When indicated, a person who is uncertain of her immunization history and has no record of immunization should receive the PCV13 vaccine. An adult aged 19 years or older who has certain medical conditions and has not been previously immunized should receive 1 dose of PCV13 vaccine. This PCV13 should be followed with a dose of pneumococcal polysaccharide (PPSV23) vaccine. The PPSV23 vaccine dose should be obtained at least 8 weeks after the dose of PCV13 vaccine. An adult aged 19 years or older who has certain medical conditions and previously received 1 or more doses of PPSV23 vaccine should receive 1 dose of PCV13. The PCV13 vaccine dose should be obtained 1 or more years after the last PPSV23 vaccine dose.    Pneumococcal polysaccharide (PPSV23) vaccine. When PCV13 is also indicated, PCV13 should be obtained first. All adults aged 65 years and older should be immunized. An adult younger than age 65 years who has certain medical conditions should be immunized. Any person who resides in a nursing home or long-term care facility should be immunized. An adult smoker should be immunized. People with an  immunocompromised condition and certain other conditions should receive both PCV13 and PPSV23 vaccines. People with human immunodeficiency virus (HIV) infection should be immunized as soon as possible after diagnosis. Immunization during chemotherapy or radiation therapy should be avoided. Routine use of PPSV23 vaccine is not recommended for American Indians, [place] Natives, or people younger than 65 years unless there are medical conditions that require PPSV23 vaccine. When indicated, people who have unknown   immunization and have no record of immunization should receive PPSV23 vaccine. One-time revaccination 5 years after the first dose of PPSV23 is recommended for people aged 19-64 years who have chronic kidney failure, nephrotic syndrome, asplenia, or immunocompromised conditions. People who received 1-2 doses of PPSV23 before age 65 years should receive another dose of PPSV23 vaccine at age 65 years or later if at least 5 years have passed since the previous dose. Doses of PPSV23 are not needed for people immunized with PPSV23 at or after age 65 years.   Preventive Services / Frequency  Ages 65 years and over  Blood pressure check.  Lipid and cholesterol check.  Lung cancer screening. / Every year if you are aged 55-80 years and have a 30-pack-year history of smoking and currently smoke or have quit within the past 15 years. Yearly screening is stopped once you have quit smoking for at least 15 years or develop a health problem that would prevent you from having lung cancer treatment.  Clinical breast exam.** / Every year after age 40 years.  BRCA-related cancer risk assessment.** / For women who have family members with a BRCA-related cancer (breast, ovarian, tubal, or peritoneal cancers).  Mammogram.** / Every year beginning at age 40 years and continuing for as long as you are in good health. Consult with your health care provider.  Pap test.** / Every 3 years starting at age 30 years  through age 65 or 70 years with 3 consecutive normal Pap tests. Testing can be stopped between 65 and 70 years with 3 consecutive normal Pap tests and no abnormal Pap or HPV tests in the past 10 years.  Fecal occult blood test (FOBT) of stool. / Every year beginning at age 50 years and continuing until age 75 years. You may not need to do this test if you get a colonoscopy every 10 years.  Flexible sigmoidoscopy or colonoscopy.** / Every 5 years for a flexible sigmoidoscopy or every 10 years for a colonoscopy beginning at age 50 years and continuing until age 75 years.  Hepatitis C blood test.** / For all people born from 1945 through 1965 and any individual with known risks for hepatitis C.  Osteoporosis screening.** / A one-time screening for women ages 65 years and over and women at risk for fractures or osteoporosis.  Skin self-exam. / Monthly.  Influenza vaccine. / Every year.  Tetanus, diphtheria, and acellular pertussis (Tdap/Td) vaccine.** / 1 dose of Td every 10 years.  Zoster vaccine.** / 1 dose for adults aged 60 years or older.  Pneumococcal 13-valent conjugate (PCV13) vaccine.** / Consult your health care provider.  Pneumococcal polysaccharide (PPSV23) vaccine.** / 1 dose for all adults aged 65 years and older. Screening for abdominal aortic aneurysm (AAA)  by ultrasound is recommended for people who have history of high blood pressure or who are current or former smokers. 

## 2015-01-09 NOTE — Progress Notes (Addendum)
Patient ID: Tracey Todd, female   DOB: Jul 10, 1935, 80 y.o.   MRN: [id number]  MEDICARE ANNUAL WELLNESS VISIT AND CPE  Will get screening Aortoscan next visit   Assessment:   1. Essential hypertension  - EKG 12-Lead - TSH  2. Hyperlipidemia  - Lipid panel  3. 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 Vitamin D deficiency  - Vit D  25 hydroxy (rtn osteoporosis monitoring)  5. Congestive heart failure, unspecified congestive heart failure chronicity, unspecified congestive heart failure type   6. PAF-fib and flutter   7. Obesity   8. Screening for rectal cancer  - POC Hemoccult Bld/Stl (3-Cd Home Screen); Future  9. Depression screen   10. At low risk for fall   11. Medication management  - Urine Microscopic - CBC with Differential/Platelet - BASIC METABOLIC PANEL WITH GFR - Hepatic function panel - Magnesium  12. Routine general medical examination at a health care facility   Plan:   During the course of the visit the patient was educated and counseled about appropriate screening and preventive services including:    Pneumococcal vaccine   Influenza vaccine  Td vaccine  Screening electrocardiogram  Bone densitometry screening  Colorectal cancer screening  Diabetes screening  Glaucoma screening  Nutrition counseling   Advanced directives: requested  Screening recommendations, referrals: Vaccinations:  Immunization History  Administered Date(s) Administered  . Influenza Split 05/23/2013  . Influenza, High Dose Seasonal PF 05/30/2014  . Pneumococcal Polysaccharide-23 07/29/2013  . Td 08/31/2011  Prevnar vaccine undecided Shingles vaccine undecided Hep B vaccine not indicated  Nutrition assessed and recommended  Colonoscopy 09/02/2014 Recommended yearly ophthalmology/optometry visit for glaucoma screening and  checkup Recommended yearly dental visit for hygiene and checkup Advanced directives - No - given forms  Conditions/risks identified: BMI: Discussed weight loss, diet, and increase physical activity.  Increase physical activity: AHA recommends 150 minutes of physical activity a week.  Medications reviewed Diabetes is not at goal, ACE/ARB therapy: Yes. Urinary Incontinence is not an issue: discussed non pharmacology and pharmacology options.  Fall risk: low- discussed PT, home fall assessment, medications.   Subjective:    Tracey Todd  presents for [employer] Visit and complete physical.  Date of last medicare wellness visit was 12/05/2013. This very nice 80 y.o.female presents for CPE  with Hypertension, Hyperlipidemia, Pre-Diabetes and Vitamin D Deficiency.    Patient is treated for HTN & BP has been controlled at home. Today's  . Patient has had no complaints of any cardiac type chest pain, palpitations, dyspnea/orthopnea/PND, dizziness, claudication, or dependent edema.   Hyperlipidemia is controlled with diet & meds. Patient denies myalgias or other med SE's. Last Lipids were at goal - Total Chol 163; HDL 58; LDL 84; Trig 104 on 10/08/2014.   Also, the patient has history of T2_NIDDM and has had no symptoms of reactive hypoglycemia, diabetic polys, paresthesias or visual blurring.  Last A1c was  6.9% on 10/08/2014.    Further, the patient also has history of Vitamin D Deficiency and supplements vitamin D without any suspected side-effects. Last vitamin D was 46 on  10/08/2014.  Names of Other Physician/Practitioners you currently use: 1. [facility] here for primary care 2. Dr Elmer Picker, eye doctor, last visit May 2016  3. No dentist, has dentures  Patient Care Team: Lucky Cowboy, MD as PCP - General (  Internal Medicine) Mateo FlowKathryn Hecker, MD as Consulting Physician (Ophthalmology) Lewayne BuntingBrian S Crenshaw, MD as Consulting Physician  (Cardiology) Wendall StadePeter C Nishan, MD as Consulting Physician (Cardiology) Carman ChingJames Edwards, MD as Consulting Physician (Gastroenterology) Frederico Hammananiel Caffrey, MD as Consulting Physician (Orthopedic Surgery) Delories HeinzKathryn P Egerton, DPM as Consulting Physician (Podiatry) Amber Carl BestKnight Seiler, NP as Nurse Practitioner (Cardiology)  Medication Review: Medication Sig  . Acetaminophen (ARTHRITIS PAIN RELIEVER PO) Take 2 capsules by mouth daily as needed (arthritis).   Marland Kitchen. amLODipine (NORVASC) 5 MG tablet TAKE ONE TABLET BY MOUTH ONCE DAILY  . aspirin 81 MG tablet Take 81 mg by mouth daily.  . Cetirizine HCl (ZYRTEC ALLERGY PO) Take 1 tablet by mouth daily.  . Cholecalciferol (VITAMIN D) 2000 UNITS CAPS Take 2 capsules by mouth daily.  Marland Kitchen. FREESTYLE LITE test strip USE ONE STRIP TO CHECK GLUCOSE ONCE DAILY  . glucose blood (FREESTYLE LITE) test strip ONE STRIP TO CHECK GLUCOSE ONCE DAILY DX E10.9  . hydrochlorothiazide (HYDRODIURIL) 25 MG tablet Take 1 tablet (25 mg total) by mouth daily.  . Lancets (FREESTYLE) lancets USE ONE LANCET TO CHECK GLUCOSE EVERY DAY  . Magnesium 500 MG TABS Take 500 mg by mouth daily.  . metFORMIN (GLUCOPHAGE XR) 500 MG 24 hr tablet Take 2 tablets twice daily with meals for Diabetes (Patient taking differently: Take 2 tablets by mouth twice daily with meals for Diabetes)  . pravastatin (PRAVACHOL) 40 MG tablet TAKE ONE TABLET BY MOUTH ONCE DAILY AT BEDTIME FOR CHOLESTEROL  . quinapril (ACCUPRIL) 20 MG tablet Take 1 tablet (20 mg total) by mouth 2 (two) times daily.   Current Problems (verified) Patient Active Problem List   Diagnosis Date Noted  . Pacemaker implanted 10/25/14- St Jude 10/26/2014  . Normal coronary arteries and LVF 10/23/14 10/24/2014  . Essential hypertension 10/22/2014  . PAF-fib and flutter 10/22/2014  . Congestive heart failure 10/21/2014  . 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 Obesity 07/08/2014  . Vitamin D deficiency 09/28/2013  . Medication management 09/28/2013  .  Hyperlipidemia   . DJD (degenerative joint disease)   . Fatty liver    Screening Tests Health Maintenance  Topic Date Due  . ZOSTAVAX  06/01/1997  . PNA vac Low Risk Adult (2 of 2 - PCV13) 07/29/2014  . FOOT EXAM  12/06/2014  . URINE MICROALBUMIN  12/28/2014  . INFLUENZA VACCINE  03/31/2015  . HEMOGLOBIN A1C  04/08/2015  . OPHTHALMOLOGY EXAM  12/31/2015  . TETANUS/TDAP  08/30/2021  . COLONOSCOPY  09/02/2024  . DEXA SCAN  Completed   Immunization History  Administered Date(s) Administered  . Influenza Split 05/23/2013  . Influenza, High Dose Seasonal PF 05/30/2014  . Pneumococcal Polysaccharide-23 07/29/2013  . Td 08/31/2011    Preventative care: Last colonoscopy: 09/02/2014  History reviewed: allergies, current medications, past family history, past medical history, past social history, past surgical history and problem list  Risk Factors: Tobacco History  Substance Use Topics  . Smoking status: Former Smoker    Quit date: 12/06/1963  . Smokeless tobacco: Not on file  . Alcohol Use: No   She does not smoke.  Patient is a former smoker. Are there smokers in your home (other than you)?  No Alcohol Current alcohol use: none  Caffeine Current caffeine use: coffee 0-2 cups  /day  Exercise Current exercise: ststionary bike x 30 + min 4-5 x /week  Nutrition/Diet Current diet: in general, an "unhealthy" diet  Cardiac risk factors: advanced age (older than 2555 for men, 7365 for women), diabetes  mellitus, dyslipidemia, family history of premature cardiovascular disease, hypertension, obesity (BMI >= 30 kg/m2), sedentary lifestyle and smoking/ tobacco exposure.  Depression Screen (Note: if answer to either of the following is "Yes", a more complete depression screening is indicated)   Q1: Over the past two weeks, have you felt down, depressed or hopeless? No  Q2: Over the past two weeks, have you felt little interest or pleasure in doing things? No  Have you lost  interest or pleasure in daily life? No  Do you often feel hopeless? No  Do you cry easily over simple problems? No  Activities of Daily Living In your present state of health, do you have any difficulty performing the following activities?:  Driving? No Managing money?  No Feeding yourself? No Getting from bed to chair? No Climbing a flight of stairs? No Preparing food and eating?: No Bathing or showering? No Getting dressed: No Getting to the toilet? No Using the toilet:No Moving around from place to place: No In the past year have you fallen or had a near fall?: episode 3 weeks ago slipping on a wet floor w/o significant injury.   Are you sexually active?  No  Do you have more than one partner?  No  Vision Difficulties: No  Hearing Difficulties: No Do you often ask people to speak up or repeat themselves? No Do you experience ringing or noises in your ears? No Do you have difficulty understanding soft or whispered voices? Sometimes.  Cognition  Do you feel that you have a problem with memory?No  Do you often misplace items? No  Do you feel safe at home?  Yes  Advanced directives Does patient have a Health Care Power of Attorney? No - gave forms Does patient have a Living Will? No gave forms  Past Medical History  Diagnosis Date  . Vitamin D deficiency   . Nodule of chest wall   . Heel pain     left foot  . Hypercholesteremia   . HTN (hypertension)   . Type II or unspecified type diabetes mellitus without mention of complication, not stated as uncontrolled   . DJD (degenerative joint disease)   . Fatty liver   . Normal coronary arteries 2/10/03/14  . Acute CHF 10/21/14    secondary to bradycardia   Past Surgical History  Procedure Laterality Date  . Tonsillectomy and adenoidectomy    . Total knee arthroplasty    . Breast surgery    . Cataract extraction    . Left and right heart catheterization with coronary angiogram N/A 10/23/2014    Procedure: LEFT AND  RIGHT HEART CATHETERIZATION WITH CORONARY ANGIOGRAM;  Surgeon: Micheline ChapmanMichael D Cooper, MD;  [facility];  Service: Cardiovascular;  Laterality: N/A;  . Permanent pacemaker insertion N/A 10/25/2014    Procedure: PERMANENT PACEMAKER INSERTION;  Surgeon: Hillis RangeJames Allred, MD;  [facility];  Service: Cardiovascular;  Laterality: N/A;   ROS: Constitutional: Denies fever, chills, weight loss/gain, headaches, insomnia, fatigue, night sweats, and change in appetite. Eyes: Denies redness, blurred vision, diplopia, discharge, itchy, watery eyes.  ENT: Denies discharge, congestion, post nasal drip, epistaxis, sore throat, earache, hearing loss, dental pain, Tinnitus, Vertigo, Sinus pain, snoring.  Cardio: Denies chest pain, palpitations, irregular heartbeat, syncope, dyspnea, diaphoresis, orthopnea, PND, claudication, edema Respiratory: denies cough, dyspnea, DOE, pleurisy, hoarseness, laryngitis, wheezing.  Gastrointestinal: Denies dysphagia, heartburn, reflux, water brash, pain, cramps, nausea, vomiting, bloating, diarrhea, constipation, hematemesis, melena, hematochezia, jaundice, hemorrhoids Genitourinary: Denies dysuria, frequency, urgency, nocturia, hesitancy,  discharge, hematuria, flank pain Breast: Breast lumps, nipple discharge, bleeding.  Musculoskeletal: Denies arthralgia, myalgia, stiffness, Jt. Swelling, pain, limp, and strain/sprain. Denies falls. Skin: Denies puritis, rash, hives, warts, acne, eczema, changing in skin lesion Neuro: No weakness, tremor, incoordination, spasms, paresthesia, pain Psychiatric: Denies confusion, memory loss, sensory loss. Denies Depression. Endocrine: Denies change in weight, skin, hair change, nocturia, and paresthesia, diabetic polys, visual blurring, hyper / hypo glycemic episodes.  Heme/Lymph: No excessive bleeding, bruising, enlarged lymph nodes  Objective:       BP 132/70 Pulse 84  Temp 97.7 F   Resp 16  Ht 4' 10.5"   Wt 181 lb     BMI 37.18    General Appearance: Well nourished, alert, WD/WN, female and in no apparent distress. Eyes: PERRLA, EOMs, conjunctiva no swelling or erythema, normal fundi and vessels. Sinuses: No frontal/maxillary tenderness ENT/Mouth: EACs patent / TMs  nl. Nares clear without erythema, swelling, mucoid exudates. Oral hygiene is good. No erythema, swelling, or exudate. Tongue normal, non-obstructing. Tonsils not swollen or erythematous. Hearing normal.  Neck: Supple, thyroid normal. No bruits, nodes or JVD. Respiratory: Respiratory effort normal.  BS equal and clear bilateral without rales, rhonci, wheezing or stridor. Cardio: Heart sounds are normal with regular rate and rhythm and no murmurs, rubs or gallops. Peripheral pulses are normal and equal bilaterally without edema. No aortic or femoral bruits. Chest: symmetric with normal excursions and percussion. Breasts: Symmetric, without lumps, nipple discharge, retractions, or fibrocystic changes.  Abdomen: Flat, soft  with nl bowel sounds. Nontender, no guarding, rebound, hernias, masses, or organomegaly.  Lymphatics: Non tender without lymphadenopathy.  Genitourinary:  Musculoskeletal: Full ROM all peripheral extremities, joint stability, 5/5 strength, and normal gait. Skin: Warm and dry without rashes, lesions, cyanosis, clubbing or  ecchymosis.  Neuro: Cranial nerves intact, reflexes equal bilaterally. Normal muscle tone, no cerebellar symptoms. Sensation intact by monofilament testing to the distal feet/toes bilaterally.  Pysch: Alert and oriented X 3, normal affect, Insight and Judgment appropriate.   Cognitive Testing  Alert? Yes  Normal Appearance?Yes  Oriented to person? Yes  Place? Yes   Time? Yes  Recall of three objects?  Yes  Can perform simple calculations? Yes  Displays appropriate judgment? Yes  Can read the correct time from a watch/clock?Yes  Medicare Attestation I have personally reviewed: The patient's medical and social  history Their use of alcohol, tobacco or illicit drugs Their current medications and supplements The patient's functional ability including ADLs,fall risks, home safety risks, cognitive, and hearing and visual impairment Diet and physical activities Evidence for depression or mood disorders  The patient's weight, height, BMI, and visual acuity have been recorded in the chart.  I have made referrals, counseling, and provided education to the patient based on review of the above and I have provided the patient with a written personalized care plan for preventive services.  Over 40 minutes of exam, counseling, chart review was performed.  Zadie Deemer DAVID, MD   01/09/2015

## 2015-01-10 LAB — INSULIN, RANDOM: Insulin: 8.3 u[IU]/mL (ref 2.0–19.6)

## 2015-01-10 LAB — URINALYSIS, MICROSCOPIC ONLY
Bacteria, UA: NONE SEEN
Casts: NONE SEEN
Crystals: NONE SEEN
Squamous Epithelial / LPF: NONE SEEN

## 2015-01-10 LAB — VITAMIN D 25 HYDROXY (VIT D DEFICIENCY, FRACTURES): VIT D 25 HYDROXY: 51 ng/mL (ref 30–100)

## 2015-01-10 LAB — MICROALBUMIN / CREATININE URINE RATIO
Creatinine, Urine: 152.1 mg/dL
MICROALB UR: 0.8 mg/dL (ref <2.0)
Microalb Creat Ratio: 5.3 mg/g (ref 0.0–30.0)

## 2015-01-19 ENCOUNTER — Encounter: Payer: Self-pay | Admitting: *Deleted

## 2015-01-29 ENCOUNTER — Ambulatory Visit (INDEPENDENT_AMBULATORY_CARE_PROVIDER_SITE_OTHER): Payer: Medicare Other | Admitting: Internal Medicine

## 2015-01-29 ENCOUNTER — Encounter: Payer: Self-pay | Admitting: Internal Medicine

## 2015-01-29 VITALS — BP 128/62 | HR 60 | Ht 58.5 in | Wt 183.8 lb

## 2015-01-29 DIAGNOSIS — I495 Sick sinus syndrome: Secondary | ICD-10-CM | POA: Diagnosis not present

## 2015-01-29 DIAGNOSIS — I480 Paroxysmal atrial fibrillation: Secondary | ICD-10-CM | POA: Diagnosis not present

## 2015-01-29 LAB — CUP PACEART INCLINIC DEVICE CHECK
Battery Remaining Longevity: 129.6 mo
Battery Voltage: 3.02 V
Brady Statistic RA Percent Paced: 73 %
Brady Statistic RV Percent Paced: 1 %
Date Time Interrogation Session: 20160601112646
Lead Channel Impedance Value: 600 Ohm
Lead Channel Pacing Threshold Amplitude: 0.75 V
Lead Channel Pacing Threshold Amplitude: 0.75 V
Lead Channel Pacing Threshold Pulse Width: 0.4 ms
Lead Channel Pacing Threshold Pulse Width: 0.4 ms
Lead Channel Sensing Intrinsic Amplitude: 9.7 mV
Lead Channel Setting Pacing Amplitude: 1 V
Lead Channel Setting Pacing Amplitude: 1.75 V
MDC IDC MSMT LEADCHNL RA IMPEDANCE VALUE: 462.5 Ohm
MDC IDC MSMT LEADCHNL RA SENSING INTR AMPL: 3.4 mV
MDC IDC SET LEADCHNL RV PACING PULSEWIDTH: 0.4 ms
MDC IDC SET LEADCHNL RV SENSING SENSITIVITY: 2 mV
Pulse Gen Serial Number: 7700661

## 2015-01-29 NOTE — Patient Instructions (Signed)
Medication Instructions:  Your physician recommends that you continue on your current medications as directed. Please refer to the Current Medication list given to you today.   Labwork: None ordered  Testing/Procedures: None ordered  Follow-Up: Remote monitoring is used to monitor your Pacemaker or ICD from home. This monitoring reduces the number of office visits required to check your device to one time per year. It allows us to keep an eye on the functioning of your device to ensure it is working properly. You are scheduled for a device check from home on 04/30/15. You may send your transmission at any time that day. If you have a wireless device, the transmission will be sent automatically. After your physician reviews/ your transmission, you will receive a postcard with your next transmission date.  Your physician wants you to follow-up in: 12 months with Amber Seiler, NP You will receive a reminder letter in the mail two months in advance. If you don't receive a letter, please call our office to schedule the follow-up appointment.     Any Other Special Instructions Will Be Listed Below (If Applicable).  

## 2015-01-29 NOTE — Progress Notes (Signed)
Electrophysiology Office Note   Date:  01/29/2015   ID:  Tracey StaffMary C Basso, DOB 08/15/1935, MRN [id number]  PCP:  Nadean CorwinMCKEOWN,WILLIAM DAVID, MD  Cardiologist:  Dr Elease HashimotoNahser Primary Electrophysiologist: Hillis RangeJames Tela Kotecki, MD    Chief Complaint  Patient presents with  . Bradycardia     History of Present Illness: Tracey Todd is a 80 y.o. female who presents today for electrophysiology evaluation.   Doing well s/p PPM implant.  Denies procedure related complications.  Energy is much better.  Exercise tolerance is also improved.  Now limited primarily by DJD.  She is trying to lose weight but not having much success.  Today, she denies symptoms of palpitations, chest pain, shortness of breath, orthopnea, PND, lower extremity edema, claudication, dizziness, presyncope, syncope, bleeding, or neurologic sequela. The patient is tolerating medications without difficulties and is otherwise without complaint today.    Past Medical History  Diagnosis Date  . Vitamin D deficiency   . Nodule of chest wall   . Heel pain     left foot  . Hypercholesteremia   . HTN (hypertension)   . Type II or unspecified type diabetes mellitus without mention of complication, not stated as uncontrolled   . DJD (degenerative joint disease)   . Fatty liver   . Normal coronary arteries 2/10/03/14  . Acute CHF 10/21/14    secondary to bradycardia   Past Surgical History  Procedure Laterality Date  . Tonsillectomy and adenoidectomy    . Total knee arthroplasty    . Breast surgery    . Cataract extraction    . Left and right heart catheterization with coronary angiogram N/A 10/23/2014    Procedure: LEFT AND RIGHT HEART CATHETERIZATION WITH CORONARY ANGIOGRAM;  Surgeon: Micheline ChapmanMichael D Cooper, MD;  [facility];  Service: Cardiovascular;  Laterality: N/A;  . Permanent pacemaker insertion N/A 10/25/2014    Procedure: PERMANENT PACEMAKER INSERTION;  Surgeon: Hillis RangeJames Kayleb Warshaw, MD;  [facility];  Service:  Cardiovascular;  Laterality: N/A;     Current Outpatient Prescriptions  Medication Sig Dispense Refill  . Acetaminophen (ARTHRITIS PAIN RELIEVER PO) Take 2 capsules by mouth daily as needed (arthritis).     Marland Kitchen. amLODipine (NORVASC) 5 MG tablet TAKE ONE TABLET BY MOUTH ONCE DAILY 30 tablet 3  . aspirin 81 MG tablet Take 81 mg by mouth daily.    . Cetirizine HCl (ZYRTEC ALLERGY PO) Take 1 tablet by mouth daily.    . Cholecalciferol (VITAMIN D) 2000 UNITS CAPS Take 6,000 Units by mouth daily.     Marland Kitchen. FREESTYLE LITE test strip USE ONE STRIP TO CHECK GLUCOSE ONCE DAILY 100 each PRN  . glucose blood (FREESTYLE LITE) test strip ONE STRIP TO CHECK GLUCOSE ONCE DAILY DX E10.9 50 each 2  . hydrochlorothiazide (HYDRODIURIL) 25 MG tablet Take 1 tablet (25 mg total) by mouth daily. 90 tablet 3  . Lancets (FREESTYLE) lancets USE ONE LANCET TO CHECK GLUCOSE EVERY DAY 100 each 99  . Magnesium 500 MG TABS Take 500 mg by mouth 2 (two) times daily.     . metFORMIN (GLUCOPHAGE XR) 500 MG 24 hr tablet Take 2 tablets twice daily with meals for Diabetes (Patient taking differently: Take 2 tablets by mouth twice daily with meals for Diabetes) 360 tablet 99  . pravastatin (PRAVACHOL) 40 MG tablet TAKE ONE TABLET BY MOUTH ONCE DAILY AT BEDTIME FOR CHOLESTEROL 90 tablet 1  . quinapril (ACCUPRIL) 20 MG tablet Take 1 tablet (20 mg total) by mouth  2 (two) times daily. 180 tablet 3   No current facility-administered medications for this visit.    Allergies:   Penicillins; Calcium-containing compounds; Celebrex; Daypro; and Feldene   Social History:  The patient  reports that she quit smoking about 51 years ago. She does not have any smokeless tobacco history on file. She reports that she does not drink alcohol.   Family History:  The patient's family history includes Cancer in her father and mother; Diabetes in her mother.    ROS:  Please see the history of present illness.   All other systems are reviewed and negative.     PHYSICAL EXAM: VS:  BP 128/62 mmHg  Pulse 60  Ht 4' 10.5" (1.486 m)  Wt 83.371 kg (183 lb 12.8 oz)  BMI 37.76 kg/m2 , BMI Body mass index is 37.76 kg/(m^2). GEN: Well nourished, well developed, in no acute distress HEENT: normal Neck: no JVD, carotid bruits, or masses Cardiac: RRR; no murmurs, rubs, or gallops,no edema  Respiratory:  clear to auscultation bilaterally, normal work of breathing GI: soft, nontender, nondistended, + BS MS: no deformity or atrophy Skin: warm and dry, device pocket is well healed Neuro:  Strength and sensation are intact Psych: euthymic mood, full affect  Device interrogation is reviewed today in detail.  See PaceArt for details.   Recent Labs: 10/21/2014: B Natriuretic Peptide 649.3* 01/09/2015: ALT <8; BUN 21; Creatinine 0.73; Hemoglobin 11.6*; Magnesium 1.6; Platelets 286; Potassium 4.0; Sodium 138; TSH 1.600    Lipid Panel     Component Value Date/Time   CHOL 155 01/09/2015 1241   TRIG 76 01/09/2015 1241   HDL 64 01/09/2015 1241   CHOLHDL 2.4 01/09/2015 1241   VLDL 15 01/09/2015 1241   LDLCALC 76 01/09/2015 1241     Wt Readings from Last 3 Encounters:  01/29/15 83.371 kg (183 lb 12.8 oz)  01/09/15 82.101 kg (181 lb)  01/02/15 82.283 kg (181 lb 6.4 oz)      Other studies Reviewed: Additional studies/ records that were reviewed today include: [facility] notes   ASSESSMENT AND PLAN:  1.  Sick sinus Normal pacemaker function See Pace Art report No changes today  2. Overweight Lifestyle modification encouraged  3. Atrial flutter/ atrial fibrillation Very low burden by pacemaker interrogation She would prefer to avoid anticoagulation unless her arrhythmia burden increases   Follow-up: merlin Follow-up with Lori/ Dr Elease Hashimoto as scheduled Return to see EP NP in 1 year  Signed, Hillis Range, MD  01/29/2015 10:58 AM     [facility] [address] [place] [address] [phone]  (office) [phone] (fax)

## 2015-03-11 ENCOUNTER — Other Ambulatory Visit: Payer: Self-pay | Admitting: Internal Medicine

## 2015-03-12 ENCOUNTER — Other Ambulatory Visit: Payer: Self-pay | Admitting: Physician Assistant

## 2015-03-12 ENCOUNTER — Other Ambulatory Visit: Payer: Self-pay | Admitting: *Deleted

## 2015-03-12 ENCOUNTER — Encounter: Payer: Self-pay | Admitting: Internal Medicine

## 2015-03-12 DIAGNOSIS — E1121 Type 2 diabetes mellitus with diabetic nephropathy: Secondary | ICD-10-CM

## 2015-03-12 DIAGNOSIS — R1013 Epigastric pain: Secondary | ICD-10-CM

## 2015-03-12 MED ORDER — OMEPRAZOLE 20 MG PO TBEC: 20.0000 mg | DELAYED_RELEASE_TABLET | Freq: Every day | ORAL | Status: DC

## 2015-03-12 MED ORDER — QUINAPRIL HCL 20 MG PO TABS: 20.0000 mg | ORAL_TABLET | Freq: Two times a day (BID) | ORAL | Status: DC

## 2015-04-04 ENCOUNTER — Ambulatory Visit (INDEPENDENT_AMBULATORY_CARE_PROVIDER_SITE_OTHER): Payer: Medicare Other | Admitting: Podiatry

## 2015-04-04 ENCOUNTER — Encounter: Payer: Self-pay | Admitting: Podiatry

## 2015-04-04 VITALS — BP 124/60 | HR 70 | Resp 16

## 2015-04-04 DIAGNOSIS — M79676 Pain in unspecified toe(s): Secondary | ICD-10-CM | POA: Diagnosis not present

## 2015-04-04 DIAGNOSIS — B351 Tinea unguium: Secondary | ICD-10-CM | POA: Diagnosis not present

## 2015-04-04 DIAGNOSIS — E1121 Type 2 diabetes mellitus with diabetic nephropathy: Secondary | ICD-10-CM

## 2015-04-04 NOTE — Progress Notes (Signed)
HPI: Patient presents today for follow up of foot and nail care. Denies any new complaints today.  Objective: Patients chart is reviewed. Neurovascular status unchanged. Patients nails are thickened, discolored, distrophic, friable and brittle with yellow-brown discoloration. Patient subjectively relates they are painful with shoes and with ambulation.both feet No STJ ROM B/L. Assessment: Symptomatic onychomycosis  Plan: Discussed treatment options and alternatives. The symptomatic toenails were debrided through manual means only without complication. Return appointment recommended at routine intervals of 3 months  Note- patient requests no mechanical debridement- only nail trim

## 2015-04-08 ENCOUNTER — Other Ambulatory Visit: Payer: Self-pay | Admitting: Physician Assistant

## 2015-04-16 ENCOUNTER — Other Ambulatory Visit: Payer: Self-pay | Admitting: Internal Medicine

## 2015-04-18 ENCOUNTER — Ambulatory Visit (INDEPENDENT_AMBULATORY_CARE_PROVIDER_SITE_OTHER): Payer: Medicare Other | Admitting: Internal Medicine

## 2015-04-18 ENCOUNTER — Encounter: Payer: Self-pay | Admitting: Internal Medicine

## 2015-04-18 VITALS — BP 126/60 | HR 64 | Temp 98.2°F | Resp 16 | Ht 58.5 in | Wt 179.0 lb

## 2015-04-18 DIAGNOSIS — Z79899 Other long term (current) drug therapy: Secondary | ICD-10-CM

## 2015-04-18 DIAGNOSIS — I10 Essential (primary) hypertension: Secondary | ICD-10-CM

## 2015-04-18 DIAGNOSIS — E782 Mixed hyperlipidemia: Secondary | ICD-10-CM | POA: Diagnosis not present

## 2015-04-18 DIAGNOSIS — E1121 Type 2 diabetes mellitus with diabetic nephropathy: Secondary | ICD-10-CM

## 2015-04-18 DIAGNOSIS — E559 Vitamin D deficiency, unspecified: Secondary | ICD-10-CM | POA: Diagnosis not present

## 2015-04-18 LAB — CBC WITH DIFFERENTIAL/PLATELET
BASOS ABS: 0.1 10*3/uL (ref 0.0–0.1)
Basophils Relative: 1 % (ref 0–1)
EOS PCT: 2 % (ref 0–5)
Eosinophils Absolute: 0.1 10*3/uL (ref 0.0–0.7)
HEMATOCRIT: 35.9 % — AB (ref 36.0–46.0)
HEMOGLOBIN: 11.6 g/dL — AB (ref 12.0–15.0)
LYMPHS ABS: 1.5 10*3/uL (ref 0.7–4.0)
LYMPHS PCT: 24 % (ref 12–46)
MCH: 28 pg (ref 26.0–34.0)
MCHC: 32.3 g/dL (ref 30.0–36.0)
MCV: 86.7 fL (ref 78.0–100.0)
MPV: 9.2 fL (ref 8.6–12.4)
Monocytes Absolute: 0.6 10*3/uL (ref 0.1–1.0)
Monocytes Relative: 10 % (ref 3–12)
Neutro Abs: 4 10*3/uL (ref 1.7–7.7)
Neutrophils Relative %: 63 % (ref 43–77)
Platelets: 281 10*3/uL (ref 150–400)
RBC: 4.14 MIL/uL (ref 3.87–5.11)
RDW: 14.9 % (ref 11.5–15.5)
WBC: 6.3 10*3/uL (ref 4.0–10.5)

## 2015-04-18 LAB — BASIC METABOLIC PANEL WITH GFR
BUN: 21 mg/dL (ref 7–25)
CO2: 28 mmol/L (ref 20–31)
Calcium: 9.6 mg/dL (ref 8.6–10.4)
Chloride: 101 mmol/L (ref 98–110)
Creat: 0.87 mg/dL (ref 0.60–0.93)
GFR, Est African American: 74 mL/min (ref >=60)
GFR, Est Non African American: 64 mL/min (ref >=60)
GLUCOSE: 116 mg/dL — AB (ref 65–99)
POTASSIUM: 4.3 mmol/L (ref 3.5–5.3)
Sodium: 140 mmol/L (ref 135–146)

## 2015-04-18 LAB — TSH: TSH: 1.975 u[IU]/mL (ref 0.350–4.500)

## 2015-04-18 LAB — HEPATIC FUNCTION PANEL
ALBUMIN: 4.2 g/dL (ref 3.6–5.1)
ALK PHOS: 47 U/L (ref 33–130)
ALT: 7 U/L (ref 6–29)
AST: 12 U/L (ref 10–35)
Bilirubin, Direct: 0.1 mg/dL (ref <=0.2)
Indirect Bilirubin: 0.3 mg/dL (ref 0.2–1.2)
TOTAL PROTEIN: 6.8 g/dL (ref 6.1–8.1)
Total Bilirubin: 0.4 mg/dL (ref 0.2–1.2)

## 2015-04-18 LAB — LIPID PANEL
Cholesterol: 164 mg/dL (ref 125–200)
HDL: 64 mg/dL (ref >=46)
LDL CALC: 87 mg/dL (ref <130)
TRIGLYCERIDES: 66 mg/dL (ref <150)
Total CHOL/HDL Ratio: 2.6 Ratio (ref <=5.0)
VLDL: 13 mg/dL (ref <30)

## 2015-04-18 LAB — HEMOGLOBIN A1C
HEMOGLOBIN A1C: 6.9 % — AB (ref <5.7)
MEAN PLASMA GLUCOSE: 151 mg/dL — AB (ref <117)

## 2015-04-18 LAB — MAGNESIUM: Magnesium: 1.7 mg/dL (ref 1.5–2.5)

## 2015-04-18 MED ORDER — METFORMIN HCL ER 500 MG PO TB24: ORAL_TABLET | ORAL | Status: DC

## 2015-04-18 MED ORDER — PRAVASTATIN SODIUM 40 MG PO TABS: ORAL_TABLET | ORAL | Status: DC

## 2015-04-18 NOTE — Progress Notes (Signed)
Patient ID: Tracey Todd, female   DOB: 1935/08/29, 80 y.o.   MRN: [id number]  Assessment and Plan:  Hypertension:  -Continue medication -monitor blood pressure at home. -Continue DASH diet -Reminder to go to the ER if any CP, SOB, nausea, dizziness, severe HA, changes vision/speech, left arm numbness and tingling and jaw pain.  Cholesterol - Continue diet and exercise -Check cholesterol.   Diabetes with diabetic chronic kidney disease -Continue diet and exercise.  -Check A1C  Vitamin D Def -check level -continue medications.   Continue diet and meds as discussed. Further disposition pending results of labs. Discussed med's effects and SE's.    HPI 80 y.o. female  presents for 3 month follow up with hypertension, hyperlipidemia, diabetes and vitamin D deficiency.   Her blood pressure has been controlled at home, today their BP is BP: 126/60 mmHg.She does not workout. She denies chest pain, shortness of breath, dizziness.  She does check her BP at home.  She reports that she has been working in her garden and going fishing.  She also rides her stationary bike 3x weekly at home for 30 minutes daily.     She is on cholesterol medication and denies myalgias. Her cholesterol is at goal. The cholesterol was:  01/09/2015: Cholesterol 155; HDL 64; LDL Cholesterol 76; Triglycerides 76   She has been working on diet and exercise for diabetes with diabetic chronic kidney disease, she is on bASA, she is on ACE/ARB, and denies  foot ulcerations, hyperglycemia, hypoglycemia , increased appetite, nausea, paresthesia of the feet, polydipsia, polyuria, visual disturbances, vomiting and weight loss. Last A1C was: 01/09/2015: Hgb A1c MFr Bld 6.8*.  She reports that her blood sugars are usually below 120.  She has not had any under 70 in the last 3 months.     Patient is on Vitamin D supplement. 01/09/2015: Vit D, 25-Hydroxy 51       Current Medications:  Current Outpatient Prescriptions on File  Prior to Visit  Medication Sig Dispense Refill  . Acetaminophen (ARTHRITIS PAIN RELIEVER PO) Take 2 capsules by mouth daily as needed (arthritis).     Marland Kitchen amLODipine (NORVASC) 5 MG tablet TAKE ONE TABLET BY MOUTH ONCE DAILY 30 tablet 3  . aspirin 81 MG tablet Take 81 mg by mouth daily.    . Cetirizine HCl (ZYRTEC ALLERGY PO) Take 1 tablet by mouth daily.    . Cholecalciferol (VITAMIN D) 2000 UNITS CAPS Take 6,000 Units by mouth daily.     Marland Kitchen FREESTYLE LITE test strip USE ONE STRIP TO CHECK GLUCOSE ONCE DAILY 100 each 0  . glucose blood (FREESTYLE LITE) test strip ONE STRIP TO CHECK GLUCOSE ONCE DAILY DX E10.9 50 each 2  . hydrochlorothiazide (HYDRODIURIL) 25 MG tablet Take 1 tablet (25 mg total) by mouth daily. 90 tablet 3  . Lancets (FREESTYLE) lancets USE ONE LANCET TO CHECK GLUCOSE EVERY DAY 100 each 99  . Magnesium 500 MG TABS Take 500 mg by mouth 2 (two) times daily.     . metFORMIN (GLUCOPHAGE XR) 500 MG 24 hr tablet Take 2 tablets twice daily with meals for Diabetes (Patient taking differently: Take 2 tablets by mouth twice daily with meals for Diabetes) 360 tablet 99  . Omeprazole 20 MG TBEC Take 1 tablet (20 mg total) by mouth daily. 90 each 3  . pravastatin (PRAVACHOL) 40 MG tablet TAKE 1 BY MOUTH DAILY AT BEDTIME FOR CHOLESTEROL 90 tablet 0  . quinapril (ACCUPRIL) 20 MG tablet Take 1 tablet (  20 mg total) by mouth 2 (two) times daily. 180 tablet 3   No current facility-administered medications on file prior to visit.   Medical History:  Past Medical History  Diagnosis Date  . Vitamin D deficiency   . Nodule of chest wall   . Heel pain     left foot  . Hypercholesteremia   . HTN (hypertension)   . Type II or unspecified type diabetes mellitus without mention of complication, not stated as uncontrolled   . DJD (degenerative joint disease)   . Fatty liver   . Normal coronary arteries 2/10/03/14  . Acute CHF 10/21/14    secondary to bradycardia   Allergies:  Allergies  Allergen  Reactions  . Penicillins Anaphylaxis  . Calcium-Containing Compounds     "calcium deposits on skin"  . Celebrex [Celecoxib] Itching  . Daypro [Oxaprozin] Itching  . Feldene [Piroxicam] Other (See Comments)    Bleeding      Review of Systems:  Review of Systems  Constitutional: Negative for fever, chills and malaise/fatigue.  HENT: Negative for congestion, nosebleeds and sore throat.   Eyes: Negative.   Respiratory: Negative for cough, shortness of breath and wheezing.   Cardiovascular: Negative for chest pain, palpitations and leg swelling.  Gastrointestinal: Positive for heartburn and diarrhea. Negative for constipation, blood in stool and melena.  Genitourinary: Negative.   Skin: Negative.   Neurological: Negative for dizziness, sensory change, loss of consciousness and headaches.  Psychiatric/Behavioral: Negative for depression. The patient is not nervous/anxious and does not have insomnia.     Family history- Review and unchanged  Social history- Review and unchanged  Physical Exam: BP 126/60 mmHg  Pulse 64  Temp(Src) 98.2 F (36.8 C) (Temporal)  Resp 16  Ht 4' 10.5" (1.486 m)  Wt 179 lb (81.194 kg)  BMI 36.77 kg/m2 Wt Readings from Last 3 Encounters:  04/18/15 179 lb (81.194 kg)  01/29/15 183 lb 12.8 oz (83.371 kg)  01/09/15 181 lb (82.101 kg)   General Appearance: Well nourished well developed, non-toxic appearing, in no apparent distress. Eyes: PERRLA, EOMs, conjunctiva no swelling or erythema ENT/Mouth: Ear canals clear with no erythema, swelling, or discharge.  TMs normal bilaterally, oropharynx clear, moist, with no exudate.   Neck: Supple, thyroid normal, no JVD, no cervical adenopathy.  Respiratory: Respiratory effort normal, breath sounds clear A&P, no wheeze, rhonchi or rales noted.  No retractions, no accessory muscle usage Cardio: RRR with no MRGs. No noted edema.  Abdomen: Soft, + BS.  Non tender, no guarding, rebound, hernias,  masses. Musculoskeletal: Full ROM, 5/5 strength, Normal gait, well healed vertical surgical scars bilaterally Skin: Warm, dry without rashes, lesions, ecchymosis.  Neuro: Awake and oriented X 3, Cranial nerves intact. No cerebellar symptoms.  Psych: normal affect, Insight and Judgment appropriate.    Terri Piedra, PA-C 9:28 AM [facility]

## 2015-04-19 LAB — VITAMIN D 25 HYDROXY (VIT D DEFICIENCY, FRACTURES): Vit D, 25-Hydroxy: 65 ng/mL (ref 30–100)

## 2015-04-19 LAB — INSULIN, RANDOM: INSULIN: 8.8 u[IU]/mL (ref 2.0–19.6)

## 2015-04-21 ENCOUNTER — Encounter: Payer: Self-pay | Admitting: Internal Medicine

## 2015-04-24 ENCOUNTER — Observation Stay (HOSPITAL_COMMUNITY)
Admission: EM | Admit: 2015-04-24 | Discharge: 2015-04-26 | Disposition: A | Discharge status: Home / Self Care | Payer: Medicare Other | Attending: Internal Medicine | Admitting: Internal Medicine

## 2015-04-24 ENCOUNTER — Encounter (HOSPITAL_COMMUNITY): Payer: Self-pay | Admitting: *Deleted

## 2015-04-24 DIAGNOSIS — E785 Hyperlipidemia, unspecified: Secondary | ICD-10-CM | POA: Insufficient documentation

## 2015-04-24 DIAGNOSIS — K760 Fatty (change of) liver, not elsewhere classified: Secondary | ICD-10-CM | POA: Insufficient documentation

## 2015-04-24 DIAGNOSIS — T783XXA Angioneurotic edema, initial encounter: Principal | ICD-10-CM | POA: Diagnosis present

## 2015-04-24 DIAGNOSIS — R001 Bradycardia, unspecified: Secondary | ICD-10-CM | POA: Diagnosis not present

## 2015-04-24 DIAGNOSIS — I5030 Unspecified diastolic (congestive) heart failure: Secondary | ICD-10-CM | POA: Diagnosis not present

## 2015-04-24 DIAGNOSIS — E559 Vitamin D deficiency, unspecified: Secondary | ICD-10-CM | POA: Insufficient documentation

## 2015-04-24 DIAGNOSIS — M1990 Unspecified osteoarthritis, unspecified site: Secondary | ICD-10-CM | POA: Diagnosis not present

## 2015-04-24 DIAGNOSIS — I10 Essential (primary) hypertension: Secondary | ICD-10-CM | POA: Insufficient documentation

## 2015-04-24 DIAGNOSIS — E780 Pure hypercholesterolemia: Secondary | ICD-10-CM | POA: Diagnosis not present

## 2015-04-24 DIAGNOSIS — Z96659 Presence of unspecified artificial knee joint: Secondary | ICD-10-CM | POA: Diagnosis not present

## 2015-04-24 DIAGNOSIS — Z7982 Long term (current) use of aspirin: Secondary | ICD-10-CM | POA: Diagnosis not present

## 2015-04-24 DIAGNOSIS — E119 Type 2 diabetes mellitus without complications: Secondary | ICD-10-CM | POA: Diagnosis not present

## 2015-04-24 DIAGNOSIS — Z950 Presence of cardiac pacemaker: Secondary | ICD-10-CM | POA: Diagnosis not present

## 2015-04-24 DIAGNOSIS — E1165 Type 2 diabetes mellitus with hyperglycemia: Secondary | ICD-10-CM

## 2015-04-24 DIAGNOSIS — R220 Localized swelling, mass and lump, head: Secondary | ICD-10-CM | POA: Diagnosis present

## 2015-04-24 DIAGNOSIS — I5032 Chronic diastolic (congestive) heart failure: Secondary | ICD-10-CM | POA: Diagnosis not present

## 2015-04-24 DIAGNOSIS — Z87891 Personal history of nicotine dependence: Secondary | ICD-10-CM | POA: Insufficient documentation

## 2015-04-24 DIAGNOSIS — Z79899 Other long term (current) drug therapy: Secondary | ICD-10-CM | POA: Insufficient documentation

## 2015-04-24 LAB — CBC WITH DIFFERENTIAL/PLATELET
BASOS ABS: 0 10*3/uL (ref 0.0–0.1)
BASOS PCT: 0 % (ref 0–1)
Eosinophils Absolute: 0.2 10*3/uL (ref 0.0–0.7)
Eosinophils Relative: 2 % (ref 0–5)
HEMATOCRIT: 34.4 % — AB (ref 36.0–46.0)
HEMOGLOBIN: 11.4 g/dL — AB (ref 12.0–15.0)
LYMPHS PCT: 32 % (ref 12–46)
Lymphs Abs: 2.5 10*3/uL (ref 0.7–4.0)
MCH: 28.5 pg (ref 26.0–34.0)
MCHC: 33.1 g/dL (ref 30.0–36.0)
MCV: 86 fL (ref 78.0–100.0)
Monocytes Absolute: 0.8 10*3/uL (ref 0.1–1.0)
Monocytes Relative: 10 % (ref 3–12)
NEUTROS ABS: 4.3 10*3/uL (ref 1.7–7.7)
NEUTROS PCT: 56 % (ref 43–77)
Platelets: 271 10*3/uL (ref 150–400)
RBC: 4 MIL/uL (ref 3.87–5.11)
RDW: 14.7 % (ref 11.5–15.5)
WBC: 7.7 10*3/uL (ref 4.0–10.5)

## 2015-04-24 LAB — BASIC METABOLIC PANEL
ANION GAP: 10 (ref 5–15)
BUN: 24 mg/dL — ABNORMAL HIGH (ref 6–20)
CALCIUM: 9.8 mg/dL (ref 8.9–10.3)
CHLORIDE: 103 mmol/L (ref 101–111)
CO2: 25 mmol/L (ref 22–32)
Creatinine, Ser: 0.68 mg/dL (ref 0.44–1.00)
GFR calc non Af Amer: >60 mL/min (ref >60)
GLUCOSE: 108 mg/dL — AB (ref 65–99)
POTASSIUM: 3.9 mmol/L (ref 3.5–5.1)
Sodium: 138 mmol/L (ref 135–145)

## 2015-04-24 MED ORDER — FAMOTIDINE IN NACL 20-0.9 MG/50ML-% IV SOLN
20.0000 mg | Freq: Once | INTRAVENOUS | Status: AC
  Administered 2015-04-24: 20 mg via INTRAVENOUS
  Filled 2015-04-24: qty 50

## 2015-04-24 MED ORDER — DIPHENHYDRAMINE HCL 50 MG/ML IJ SOLN
12.5000 mg | Freq: Once | INTRAMUSCULAR | Status: AC
  Administered 2015-04-24: 12.5 mg via INTRAVENOUS
  Filled 2015-04-24: qty 1

## 2015-04-24 MED ORDER — METHYLPREDNISOLONE SODIUM SUCC 125 MG IJ SOLR
125.0000 mg | Freq: Once | INTRAMUSCULAR | Status: AC
  Administered 2015-04-24: 125 mg via INTRAVENOUS
  Filled 2015-04-24: qty 2

## 2015-04-24 MED ORDER — SODIUM CHLORIDE 0.9 % IV SOLN: 10.0000 mL/h | Freq: Once | INTRAVENOUS | Status: DC

## 2015-04-24 NOTE — ED Notes (Signed)
Pt states that around 6pm she began to notice her tongue was swelling; pt states that it has gotten worse and she feels like it is going to her throat; pt able to breathe normally at present; pt talks with thick tongue

## 2015-04-24 NOTE — ED Provider Notes (Signed)
CSN: [id number]     Arrival date & time 04/24/15  2043 History   First MD Initiated Contact with Patient 04/24/15 2110     Chief Complaint  Patient presents with  . Oral Swelling    HPI   Tracey Todd is a 80 y.o. female with a PMH of HLD, HTN, DM who presents to the ED with swelling to her tongue. She reports around 6:00 PM, she noticed swelling to her tongue. She states her swelling has progressively worsened since that time, and she reports swelling to her neck. She has not tried anything for symptom relief. She states she has not eaten any new foods or started any new medications. She reports she was in her garden earlier today, but was not stung by a bee or any other insect. She denies lightheadedness, dizziness, syncope, difficulty swallowing, drooling, chest pain, shortness of breath, abdominal pain, nausea, vomiting. She states she has been taking quinapril for HTN for approximately 10 years. She reports this has never happened to her before.    Past Medical History  Diagnosis Date  . Vitamin D deficiency   . Nodule of chest wall   . Heel pain     left foot  . Hypercholesteremia   . HTN (hypertension)   . Type II or unspecified type diabetes mellitus without mention of complication, not stated as uncontrolled   . DJD (degenerative joint disease)   . Fatty liver   . Normal coronary arteries 2/10/03/14  . Acute CHF 10/21/14    secondary to bradycardia   Past Surgical History  Procedure Laterality Date  . Tonsillectomy and adenoidectomy    . Total knee arthroplasty    . Breast surgery    . Cataract extraction    . Left and right heart catheterization with coronary angiogram N/A 10/23/2014    Procedure: LEFT AND RIGHT HEART CATHETERIZATION WITH CORONARY ANGIOGRAM;  Surgeon: Micheline Chapman, MD;  Location: [facility] CATH LAB;  Service: Cardiovascular;  Laterality: N/A;  . Permanent pacemaker insertion N/A 10/25/2014    Procedure: PERMANENT PACEMAKER INSERTION;  Surgeon: Hillis Range,  MD;  Location: [facility] CATH LAB;  Service: Cardiovascular;  Laterality: N/A;   Family History  Problem Relation Age of Onset  . Diabetes Mother   . Cancer Mother   . Cancer Father    Social History  Substance Use Topics  . Smoking status: Former Smoker    Quit date: 12/06/1963  . Smokeless tobacco: None  . Alcohol Use: No   OB History    No data available      Review of Systems  Constitutional: Negative for fever, chills, diaphoresis, activity change, appetite change and fatigue.  HENT: Positive for facial swelling. Negative for congestion, drooling, trouble swallowing and voice change.   Eyes: Negative for visual disturbance.  Respiratory: Negative for cough, chest tightness, shortness of breath, wheezing and stridor.   Cardiovascular: Negative for chest pain, palpitations and leg swelling.  Gastrointestinal: Negative for nausea, vomiting, abdominal pain, diarrhea, constipation and abdominal distention.  Genitourinary: Negative for dysuria, urgency and frequency.  Musculoskeletal: Negative for myalgias, back pain, arthralgias, neck pain and neck stiffness.  Skin: Negative for color change, pallor, rash and wound.  Neurological: Negative for dizziness, syncope, weakness, light-headedness, numbness and headaches.  All other systems reviewed and are negative.    Allergies  Penicillins; Calcium-containing compounds; Celebrex; Daypro; and Feldene  Home Medications   Prior to Admission medications   Medication Sig Start Date End Date Taking? Authorizing Provider  Acetaminophen (ARTHRITIS PAIN RELIEVER PO) Take 2 capsules by mouth daily as needed (arthritis).    Yes Historical Provider, MD  amLODipine (NORVASC) 5 MG tablet TAKE ONE TABLET BY MOUTH ONCE DAILY Patient taking differently: Take 1 tablet by mouth once daily. 11/21/14  Yes Lucky Cowboy, MD  aspirin 81 MG tablet Take 81 mg by mouth daily.   Yes Historical Provider, MD  calcium carbonate (OS-CAL) 600 MG TABS tablet Take  600 mg by mouth daily with breakfast.   Yes Historical Provider, MD  Cetirizine HCl (ZYRTEC ALLERGY PO) Take 1 tablet by mouth daily. 11/28/12  Yes Historical Provider, MD  Cholecalciferol (VITAMIN D) 2000 UNITS CAPS Take 6,000 Units by mouth daily.    Yes Historical Provider, MD  FREESTYLE LITE test strip USE ONE STRIP TO CHECK GLUCOSE ONCE DAILY 04/08/15  Yes Lucky Cowboy, MD  glucose blood (FREESTYLE LITE) test strip ONE STRIP TO CHECK GLUCOSE ONCE DAILY DX E10.9 08/05/14  Yes Quentin Mulling, PA-C  hydrochlorothiazide (HYDRODIURIL) 25 MG tablet Take 1 tablet (25 mg total) by mouth daily. 11/29/14  Yes Rosalio Macadamia, NP  Lancets (FREESTYLE) lancets USE ONE LANCET TO CHECK GLUCOSE EVERY DAY 11/16/14  Yes Lucky Cowboy, MD  Magnesium 500 MG TABS Take 500 mg by mouth 2 (two) times daily.    Yes Historical Provider, MD  metFORMIN (GLUCOPHAGE XR) 500 MG 24 hr tablet Take 2 tablets twice daily with meals for Diabetes 04/18/15  Yes Courtney Forcucci, PA-C  Omeprazole 20 MG TBEC Take 1 tablet (20 mg total) by mouth daily. Patient taking differently: Take 20 mg by mouth 2 (two) times daily.  03/12/15  Yes Lucky Cowboy, MD  pravastatin (PRAVACHOL) 40 MG tablet TAKE 1 BY MOUTH DAILY AT BEDTIME FOR CHOLESTEROL Patient taking differently: Take 40 mg by mouth at bedtime.  04/18/15  Yes Courtney Forcucci, PA-C  quinapril (ACCUPRIL) 20 MG tablet Take 1 tablet (20 mg total) by mouth 2 (two) times daily. 03/12/15  Yes Lucky Cowboy, MD    BP 148/60 mmHg  Pulse 60  Temp(Src) 98.3 F (36.8 C) (Oral)  Resp 17  Ht 4' 10.5" (1.486 m)  Wt 176 lb 12.9 oz (80.2 kg)  BMI 36.32 kg/m2  SpO2 99% Physical Exam  Constitutional: She is oriented to person, place, and time. She appears well-developed and well-nourished. No distress.  HENT:  Head: Normocephalic and atraumatic.  Right Ear: External ear normal.  Left Ear: External ear normal.  Nose: Nose normal.  Mouth/Throat: Uvula is midline and mucous membranes are  normal. No uvula swelling. No oropharyngeal exudate or tonsillar abscesses.  Edema to base of right side of tongue.  Eyes: Conjunctivae, EOM and lids are normal. Pupils are equal, round, and reactive to light. Right eye exhibits no discharge. Left eye exhibits no discharge. No scleral icterus.  Neck: Trachea normal and normal range of motion. Neck supple. No tracheal tenderness present.  Cardiovascular: Normal rate, regular rhythm, normal heart sounds, intact distal pulses and normal pulses.   Pulmonary/Chest: Effort normal and breath sounds normal. No accessory muscle usage or stridor. No respiratory distress. She has no wheezes. She has no rales. She exhibits no tenderness.  Abdominal: Soft. Normal appearance and bowel sounds are normal. She exhibits no distension and no mass. There is no tenderness. There is no rigidity, no rebound and no guarding.  Musculoskeletal: Normal range of motion. She exhibits no edema or tenderness.  Lymphadenopathy:    She has no cervical adenopathy.  Neurological: She is alert  and oriented to person, place, and time.  Skin: Skin is warm, dry and intact. No rash noted. She is not diaphoretic. No erythema. No pallor.  Psychiatric: She has a normal mood and affect. Her speech is normal and behavior is normal. Judgment and thought content normal.  Nursing note and vitals reviewed.   ED Course  Procedures (including critical care time)  Labs Review Labs Reviewed  CBC WITH DIFFERENTIAL/PLATELET - Abnormal; Notable for the following:    Hemoglobin 11.4 (*)    HCT 34.4 (*)    All other components within normal limits  BASIC METABOLIC PANEL - Abnormal; Notable for the following:    Glucose, Bld 108 (*)    BUN 24 (*)    All other components within normal limits  TYPE AND SCREEN  ABO/RH    Imaging Review No results found.   I have personally reviewed and evaluated these images and lab results as part of my medical decision-making.   EKG  Interpretation None      MDM   Final diagnoses:  Angioedema, initial encounter    80 year old female presents with edema to base of right side of tongue. Denies new medication, foods, exposures. Denies lightheadedness, dizziness, syncope, difficulty swallowing, drooling, chest pain, shortness of breath, abdominal pain, nausea, vomiting. She states she has been taking quinapril for HTN for approximately 10 years. She reports this has never happened to her before.  Patient is handling secretions well, no respiratory distress. O2 sat 98% on RA. No tachycardia. On exam, edema to base of right side of tongue. Able to visualize posterior oropharynx. Airway intact.  CBC no leukocytosis, remarkable for mild anemia, which appears chronic and stable. BMP unremarkable.  Given benadryl, pepcid, solu-medrol in the ED.  10:15 PM Patient re-checked by Dr. Donnald Garre. No new complaints. Patient handling secretions well, no respiratory distress, no increased swelling. Posterior oropharynx remains visible.   11:30 PM Patient re-checked by myself. No new complaints. Patient handling secretions well, no respiratory distress, no increased swelling. Posterior oropharynx remains visible.   Patient discussed with hospitalist. Will admit to stepdown for observation.  BP 148/60 mmHg  Pulse 60  Temp(Src) 98.3 F (36.8 C) (Oral)  Resp 17  Ht 4' 10.5" (1.486 m)  Wt 176 lb 12.9 oz (80.2 kg)  BMI 36.32 kg/m2  SpO2 99%      Mady Gemma, PA-C 04/25/15 0015  Arby Barrette, MD 05/07/15 [phone]

## 2015-04-25 ENCOUNTER — Encounter (HOSPITAL_COMMUNITY): Payer: Self-pay | Admitting: Internal Medicine

## 2015-04-25 DIAGNOSIS — T783XXA Angioneurotic edema, initial encounter: Principal | ICD-10-CM

## 2015-04-25 DIAGNOSIS — E1165 Type 2 diabetes mellitus with hyperglycemia: Secondary | ICD-10-CM

## 2015-04-25 DIAGNOSIS — I10 Essential (primary) hypertension: Secondary | ICD-10-CM

## 2015-04-25 DIAGNOSIS — E119 Type 2 diabetes mellitus without complications: Secondary | ICD-10-CM | POA: Diagnosis not present

## 2015-04-25 DIAGNOSIS — T783XXD Angioneurotic edema, subsequent encounter: Secondary | ICD-10-CM

## 2015-04-25 LAB — GLUCOSE, CAPILLARY
GLUCOSE-CAPILLARY: 142 mg/dL — AB (ref 65–99)
GLUCOSE-CAPILLARY: 298 mg/dL — AB (ref 65–99)
GLUCOSE-CAPILLARY: 160 mg/dL — AB (ref 65–99)
Glucose-Capillary: 124 mg/dL — ABNORMAL HIGH (ref 65–99)

## 2015-04-25 LAB — CBC
HEMATOCRIT: 34.9 % — AB (ref 36.0–46.0)
HEMOGLOBIN: 11.4 g/dL — AB (ref 12.0–15.0)
MCH: 28 pg (ref 26.0–34.0)
MCHC: 32.7 g/dL (ref 30.0–36.0)
MCV: 85.7 fL (ref 78.0–100.0)
Platelets: 270 10*3/uL (ref 150–400)
RBC: 4.07 MIL/uL (ref 3.87–5.11)
RDW: 14.7 % (ref 11.5–15.5)
WBC: 7.1 10*3/uL (ref 4.0–10.5)

## 2015-04-25 LAB — BASIC METABOLIC PANEL
ANION GAP: 11 (ref 5–15)
BUN: 21 mg/dL — ABNORMAL HIGH (ref 6–20)
CO2: 24 mmol/L (ref 22–32)
Calcium: 9.7 mg/dL (ref 8.9–10.3)
Chloride: 103 mmol/L (ref 101–111)
Creatinine, Ser: 0.76 mg/dL (ref 0.44–1.00)
GFR calc Af Amer: >60 mL/min (ref >60)
GLUCOSE: 171 mg/dL — AB (ref 65–99)
POTASSIUM: 4 mmol/L (ref 3.5–5.1)
Sodium: 138 mmol/L (ref 135–145)

## 2015-04-25 LAB — TYPE AND SCREEN
ABO/RH(D): O POS
ANTIBODY SCREEN: NEGATIVE

## 2015-04-25 LAB — MRSA PCR SCREENING: MRSA BY PCR: NEGATIVE

## 2015-04-25 LAB — ABO/RH: ABO/RH(D): O POS

## 2015-04-25 MED ORDER — PRAVASTATIN SODIUM 40 MG PO TABS
40.0000 mg | ORAL_TABLET | Freq: Every day | ORAL | Status: DC
  Administered 2015-04-25 (×2): 40 mg via ORAL
  Filled 2015-04-25: qty 1
  Filled 2015-04-25: qty 2

## 2015-04-25 MED ORDER — MAGNESIUM GLUCONATE 500 MG PO TABS
500.0000 mg | ORAL_TABLET | Freq: Two times a day (BID) | ORAL | Status: DC
  Administered 2015-04-25 – 2015-04-26 (×4): 500 mg via ORAL
  Filled 2015-04-25 (×7): qty 1

## 2015-04-25 MED ORDER — PANTOPRAZOLE SODIUM 40 MG PO TBEC
40.0000 mg | DELAYED_RELEASE_TABLET | Freq: Every day | ORAL | Status: DC
  Administered 2015-04-25 – 2015-04-26 (×2): 40 mg via ORAL
  Filled 2015-04-25 (×2): qty 1

## 2015-04-25 MED ORDER — SODIUM CHLORIDE 0.9 % IJ SOLN
3.0000 mL | Freq: Two times a day (BID) | INTRAMUSCULAR | Status: DC
  Administered 2015-04-25 (×3): 3 mL via INTRAVENOUS

## 2015-04-25 MED ORDER — AMLODIPINE BESYLATE 5 MG PO TABS: 5.0000 mg | ORAL_TABLET | Freq: Every day | ORAL | Status: DC

## 2015-04-25 MED ORDER — AMLODIPINE BESYLATE 10 MG PO TABS
10.0000 mg | ORAL_TABLET | Freq: Every day | ORAL | Status: DC
  Administered 2015-04-25 – 2015-04-26 (×2): 10 mg via ORAL
  Filled 2015-04-25 (×2): qty 1

## 2015-04-25 MED ORDER — HYDROCHLOROTHIAZIDE 25 MG PO TABS
25.0000 mg | ORAL_TABLET | Freq: Every day | ORAL | Status: DC
  Administered 2015-04-25 – 2015-04-26 (×2): 25 mg via ORAL
  Filled 2015-04-25 (×2): qty 1

## 2015-04-25 MED ORDER — ACETAMINOPHEN 325 MG PO TABS: 650.0000 mg | ORAL_TABLET | Freq: Four times a day (QID) | ORAL | Status: DC | PRN

## 2015-04-25 MED ORDER — INSULIN ASPART 100 UNIT/ML ~~LOC~~ SOLN
0.0000 [IU] | Freq: Three times a day (TID) | SUBCUTANEOUS | Status: DC
  Administered 2015-04-25: 5 [IU] via SUBCUTANEOUS
  Administered 2015-04-25: 2 [IU] via SUBCUTANEOUS
  Administered 2015-04-25 – 2015-04-26 (×2): 1 [IU] via SUBCUTANEOUS

## 2015-04-25 MED ORDER — ACETAMINOPHEN 650 MG RE SUPP: 650.0000 mg | Freq: Four times a day (QID) | RECTAL | Status: DC | PRN

## 2015-04-25 MED ORDER — DIPHENHYDRAMINE HCL 50 MG/ML IJ SOLN: 25.0000 mg | Freq: Four times a day (QID) | INTRAMUSCULAR | Status: DC | PRN

## 2015-04-25 MED ORDER — ONDANSETRON HCL 4 MG PO TABS: 4.0000 mg | ORAL_TABLET | Freq: Four times a day (QID) | ORAL | Status: DC | PRN

## 2015-04-25 MED ORDER — ONDANSETRON HCL 4 MG/2ML IJ SOLN: 4.0000 mg | Freq: Four times a day (QID) | INTRAMUSCULAR | Status: DC | PRN

## 2015-04-25 MED ORDER — FAMOTIDINE IN NACL 20-0.9 MG/50ML-% IV SOLN
20.0000 mg | Freq: Two times a day (BID) | INTRAVENOUS | Status: DC
  Filled 2015-04-25: qty 50

## 2015-04-25 NOTE — Progress Notes (Signed)
Utilization review completed.  

## 2015-04-25 NOTE — H&P (Signed)
[facility] History and Physical  Tracey Todd XBM:[id number] DOB: May 29, 1935 DOA: 04/24/2015  Referring physician: Ms. Kerrie Buffalo. PCP: [name], MD  Specialists: Dr. [name]. Cardiologist.  Chief Complaint: Tongue swelling.  HPI: Tracey Todd is a 81 y.o. female and history of diabetes mellitus type 2, symptomatic bradycardia status post pacemaker placement, diastolic CHF and hyperlipidemia presents to the ER because of swelling of her tongue. Patient started noticing swelling of her tongue last evening around 6 PM. Patient also had some throat swelling like feeling. Since the swelling was persistent patient came to the ER. In the ER patient was not in any acute respiratory distress and did not have any stridor or any wheezing. Denies any itching or skin rash. Denies any new medications, perfumes or detergents used. Denies any insect bites. Patient was given Solu-Medrol Pepcid and diphenhydramine. Patient will be admitted for observation. At this time we suspect patient's angioedema could be from patient's use of ace inhibitors. On exam patient is able to talk and has no stridor and no respiratory problems.   Review of Systems: As presented in the history of presenting illness, rest negative.  Past Medical History  Diagnosis Date  . Vitamin D deficiency   . Nodule of chest wall   . Heel pain     left foot  . Hypercholesteremia   . HTN (hypertension)   . Type II or unspecified type diabetes mellitus without mention of complication, not stated as uncontrolled   . DJD (degenerative joint disease)   . Fatty liver   . Normal coronary arteries 2/10/03/14  . Acute CHF 10/21/14    secondary to bradycardia   Past Surgical History  Procedure Laterality Date  . Tonsillectomy and adenoidectomy    . Total knee arthroplasty    . Breast surgery    . Cataract extraction    . Left and right heart catheterization with coronary angiogram N/A 10/23/2014    Procedure: LEFT AND  RIGHT HEART CATHETERIZATION WITH CORONARY ANGIOGRAM;  Surgeon: Micheline Chapman, MD;  Location[facility] CATH LAB;  Service: Cardiovascular;  Laterality: N/A;  . Permanent pacemaker insertion N/A 10/25/2014    Procedure: PERMANENT PACEMAKER INSERTION;  Surgeon: Hillis Range, MD;  Location[facility] C[facility] LAB;  Service: Cardiovascular;  Laterality: N/A;   Social History:  reports that she quit smoking about 51 years ago. She does not have any smokeless tobacco history on file. She reports that she does not drink alcohol. Her drug history is not on file. Where does patient live home. Can patient participate in ADLs? Yes.  Allergies  Allergen Reactions  . Penicillins Anaphylaxis  . Calcium-Containing Compounds     "calcium deposits on skin"  . Celebrex [Celecoxib] Itching  . Daypro [Oxaprozin] Itching  . Feldene [Piroxicam] Other (See Comments)    Bleeding     Family History:  Family History  Problem Relation Age of Onset  . Diabetes Mother   . Cancer Mother   . Cancer Father       Prior to Admission medications   Medication Sig Start Date End Date Taking? Authorizing Provider  Acetaminophen (ARTHRITIS PAIN RELIEVER PO) Take 2 capsules by mouth daily as needed (arthritis).    Yes Historical Provider, MD  amLODipine (NORVASC) 5 MG tablet TAKE ONE TABLET BY MOUTH ONCE DAILY Patient taking differently: Take 1 tablet by mouth once daily. 11/21/14  Yes Lucky Cowboy, MD  aspirin 81 MG tablet Take 81 mg by mouth daily.   Yes Historical Provider, MD  calcium carbonate (OS-CAL) 600 MG TABS tablet Take 600 mg by mouth daily with breakfast.   Yes Historical Provider, MD  Cetirizine HCl (ZYRTEC ALLERGY PO) Take 1 tablet by mouth daily. 11/28/12  Yes Historical Provider, MD  Cholecalciferol (VITAMIN D) 2000 UNITS CAPS Take 6,000 Units by mouth daily.    Yes Historical Provider, MD  FREESTYLE LITE test strip USE ONE STRIP TO CHECK GLUCOSE ONCE DAILY 04/08/15  Yes Lucky Cowboy, MD  glucose blood (FREESTYLE  LITE) test strip ONE STRIP TO CHECK GLUCOSE ONCE DAILY DX E10.9 08/05/14  Yes Quentin Mulling, PA-C  hydrochlorothiazide (HYDRODIURIL) 25 MG tablet Take 1 tablet (25 mg total) by mouth daily. 11/29/14  Yes Rosalio Macadamia, NP  Lancets (FREESTYLE) lancets USE ONE LANCET TO CHECK GLUCOSE EVERY DAY 11/16/14  Yes Lucky Cowboy, MD  Magnesium 500 MG TABS Take 500 mg by mouth 2 (two) times daily.    Yes Historical Provider, MD  metFORMIN (GLUCOPHAGE XR) 500 MG 24 hr tablet Take 2 tablets twice daily with meals for Diabetes 04/18/15  Yes Courtney Forcucci, PA-C  Omeprazole 20 MG TBEC Take 1 tablet (20 mg total) by mouth daily. Patient taking differently: Take 20 mg by mouth 2 (two) times daily.  03/12/15  Yes Lucky Cowboy, MD  pravastatin (PRAVACHOL) 40 MG tablet TAKE 1 BY MOUTH DAILY AT BEDTIME FOR CHOLESTEROL Patient taking differently: Take 40 mg by mouth at bedtime.  04/18/15  Yes Courtney Forcucci, PA-C  quinapril (ACCUPRIL) 20 MG tablet Take 1 tablet (20 mg total) by mouth 2 (two) times daily. 03/12/15  Yes Lucky Cowboy, MD    Physical Exam: Filed Vitals:   04/24/15 2052 04/25/15 0010  BP: 180/68 148/60  Pulse: 60 60  Temp: 98.1 F (36.7 C) 98.3 F (36.8 C)  TempSrc: Oral Oral  Resp: 20 17  Height:  4' 10.5" (1.486 m)  Weight:  80.2 kg (176 lb 12.9 oz)  SpO2: 98% 99%     General:  Moderately built and nourished.  Eyes: Anicteric no pallor.  ENT: Tongue appears mildly swollen. Uvula is not swollen.  Neck: No stridor. No neck rigidity. No JVD appreciated.  Cardiovascular: S1-S2 heard.  Respiratory: No rhonchi or crepitations.  Abdomen: Soft nontender bowel sounds present.  Skin: No rash.  Musculoskeletal: No edema.  Psychiatric: Appears normal.  Neurologic: Alert awake oriented to time place and person. Moves all extremities.  Labs on Admission:  Basic Metabolic Panel:  Recent Labs Lab 04/18/15 1001 04/24/15 2137  NA 140 138  K 4.3 3.9  CL 101 103  CO2 28 25   GLUCOSE 116* 108*  BUN 21 24*  CREATININE 0.87 0.68  CALCIUM 9.6 9.8  MG 1.7  --    Liver Function Tests:  Recent Labs Lab 04/18/15 1001  AST 12  ALT 7  ALKPHOS 47  BILITOT 0.4  PROT 6.8  ALBUMIN 4.2   No results for input(s): LIPASE, AMYLASE in the last 168 hours. No results for input(s): AMMONIA in the last 168 hours. CBC:  Recent Labs Lab 04/18/15 1001 04/24/15 2137  WBC 6.3 7.7  NEUTROABS 4.0 4.3  HGB 11.6* 11.4*  HCT 35.9* 34.4*  MCV 86.7 86.0  PLT 281 271   Cardiac Enzymes: No results for input(s): CKTOTAL, CKMB, CKMBINDEX, TROPONINI in the last 168 hours.  BNP (last 3 results)  Recent Labs  10/21/14 1729  BNP 649.3*    ProBNP (last 3 results) No results for input(s): PROBNP in the last 8760 hours.  CBG: No results for input(s): GLUCAP in the last 168 hours.  Radiological Exams on Admission: No results found.   Assessment/Plan Principal Problem:   Angioedema Active Problems:   Essential hypertension   Pacemaker implanted 10/25/14- [employer]   Diabetes mellitus type 2, controlled   1. Angioedema - appears stable at this time. Tongue is mildly swollen but patient has no respiratory distress or stridor. We will closely observe. At this time type and screen and if there is further worsening may need FFP or purified C1 esterase inhibitor or Icatibant. Discontinue ACE inhibitor and I have advised patient not to use it again. Closely observe. 2. Hypertension - discontinue ACE inhibitor secondary to #1. I'm continue patient's amlodipine and HCTZ and for now I have placed patient on hydralazine when necessary. 3. Diabetes mellitus type 2 - while inpatient and will keep patient on sliding scale coverage and hold metformin for now. 4. History of diastolic CHF - appears compensated. 5. History of symptomatic bradycardia status post pacemaker placement.   Patient has history of paroxysmal atrial flutter but is only on aspirin for now. I have reviewed  patient's old charts and labs.   DVT Prophylaxis SCDs.  Code Status: Full code.  Family Communication: Discussed with patient.  Disposition Plan: Admit for observation.    Manasi Dishon N. [facility] Pager [phone].  If 7PM-7AM, please contact night-coverage [email or web address] Password [email or web address] 04/25/2015, 1:05 AM

## 2015-04-25 NOTE — Progress Notes (Signed)
[facility]                                                                              Patient Demographics  Tracey Todd, is a 80 y.o. female, DOB - Aug 17, 1935, ZOX:[id number]  Admit date - 04/24/2015   Admitting Physician Eduard Clos, MD  Outpatient Primary MD for the patient is [name], MD  LOS -    Chief Complaint  Patient presents with  . Oral Swelling       Brief HPI   Tracey Todd is a 80 y.o. female and history of diabetes mellitus type 2, symptomatic bradycardia status post pacemaker placement, diastolic CHF and hyperlipidemia presents to the ER because of swelling of her tongue. Patient started noticing swelling of her tongue last evening around 6 PM. Patient also had some throat swelling like feeling. Since the swelling was persistent patient came to the ER. In the ER patient was not in any acute respiratory distress and did not have any stridor or any wheezing. Denied any itching or skin rash. Denied any new medications, perfumes or detergents used. Denied any insect bites. Patient was given Solu-Medrol Pepcid and diphenhydramine. Patient was admitted for observation. On exam patient is able to talk and has no stridor and no respiratory problems.     Assessment & Plan    Principal Problem:   Angioedema - Likely due to ACE inhibitor's, no prior history of angioedema or family history - I have ordered complement levels and C1 esterase panel for complete workup - Currently improving, ACE inhibitor discontinued and patient and family member also explained about it - Advance diet to solids, closely observe  Active Problems:   Essential hypertension -BP currently elevated,increased her Norvasc to 10 mg daily, continue HCTZ, hydralazine as needed    Pacemaker implanted 10/25/14- [employer]    Diabetes mellitus type 2, controlled - Continue sliding scale insulin while inpatient  Diastolic CHF, chronic 2-D echo in 2/16 showed EF  of 55-60%, moderate tricuspid regurgitation, currently euvolemic   Code Status: Full code   Family Communication: Discussed in detail with the patient, all imaging results, lab results explained to the patient  and family member   Disposition Plan: Transfer to telemetry floor   Time Spent in minutes   25 minutes   Procedures  None   Consults   none  DVT Prophylaxis SCD's  Medications  Scheduled Meds: . amLODipine  10 mg Oral Daily  . [START ON 04/26/2015] famotidine (PEPCID) IV  20 mg Intravenous Q12H  . hydrochlorothiazide  25 mg Oral Daily  . insulin aspart  0-9 Units Subcutaneous TID WC  . magnesium gluconate  500 mg Oral BID  . pantoprazole  40 mg Oral Daily  . pravastatin  40 mg Oral QHS  . sodium chloride  3 mL Intravenous Q12H   Continuous Infusions:  PRN Meds:.acetaminophen **OR** acetaminophen, diphenhydrAMINE, ondansetron **OR** ondansetron (ZOFRAN) IV   Antibiotics   Anti-infectives    None        Subjective:   Kamerin Axford was seen and examined today. Tongue swelling and lower lip swelling improving, no stridor, no chest  pain or shortness of breath. Patient denies dizziness, abdominal pain, N/V/D/C, new weakness, numbess, tingling. No acute events overnight.    Objective:   Blood pressure 124/49, pulse 64, temperature 97.5 F (36.4 C), temperature source Oral, resp. rate 16, height 4' 10.5" (1.486 m), weight 80.2 kg (176 lb 12.9 oz), SpO2 97 %.  Wt Readings from Last 3 Encounters:  04/25/15 80.2 kg (176 lb 12.9 oz)  04/18/15 81.194 kg (179 lb)  01/29/15 83.371 kg (183 lb 12.8 oz)     Intake/Output Summary (Last 24 hours) at 04/25/15 1101 Last data filed at 04/25/15 1020  Gross per 24 hour  Intake      3 ml  Output      0 ml  Net      3 ml    Exam  General: Alert and oriented x 3, NAD  HEENT:  PERRLA, EOMI, Anicteric Sclera, mucous membranes moist.  tongue swelling improving   Neck: Supple, no JVD, no masses  CVS: S1 S2  auscultated, no rubs, murmurs or gallops. Regular rate and rhythm.  Respiratory: Clear to auscultation bilaterally, no wheezing, rales or rhonchi  Abdomen: Soft, nontender, nondistended, + bowel sounds  Ext: no cyanosis clubbing or edema  Neuro: AAOx3, Cr N's II- XII. Strength 5/5 upper and lower extremities bilaterally  Skin: No rashes  Psych: Normal affect and demeanor, alert and oriented x3    Data Review   Micro Results Recent Results (from the past 240 hour(s))  MRSA PCR Screening     Status: None   Collection Time: 04/25/15 12:15 AM  Result Value Ref Range Status   MRSA by PCR NEGATIVE NEGATIVE Final    Comment:        The GeneXpert MRSA Assay (FDA approved for NASAL specimens only), is one component of a comprehensive MRSA colonization surveillance program. It is not intended to diagnose MRSA infection nor to guide or monitor treatment for MRSA infections.     Radiology Reports No results found.  CBC  Recent Labs Lab 04/24/15 2137 04/25/15 0400  WBC 7.7 7.1  HGB 11.4* 11.4*  HCT 34.4* 34.9*  PLT 271 270  MCV 86.0 85.7  MCH 28.5 28.0  MCHC 33.1 32.7  RDW 14.7 14.7  LYMPHSABS 2.5  --   MONOABS 0.8  --   EOSABS 0.2  --   BASOSABS 0.0  --     Chemistries   Recent Labs Lab 04/24/15 2137 04/25/15 0400  NA 138 138  K 3.9 4.0  CL 103 103  CO2 25 24  GLUCOSE 108* 171*  BUN 24* 21*  CREATININE 0.68 0.76  CALCIUM 9.8 9.7   ------------------------------------------------------------------------------------------------------------------ estimated creatinine clearance is 53.3 mL/min (by C-G formula based on Cr of 0.76). ------------------------------------------------------------------------------------------------------------------ No results for input(s): HGBA1C in the last 72 hours. ------------------------------------------------------------------------------------------------------------------ No results for input(s): CHOL, HDL, LDLCALC,  TRIG, CHOLHDL, LDLDIRECT in the last 72 hours. ------------------------------------------------------------------------------------------------------------------ No results for input(s): TSH, T4TOTAL, T3FREE, THYROIDAB in the last 72 hours.  Invalid input(s): FREET3 ------------------------------------------------------------------------------------------------------------------ No results for input(s): VITAMINB12, FOLATE, FERRITIN, TIBC, IRON, RETICCTPCT in the last 72 hours.  Coagulation profile No results for input(s): INR, PROTIME in the last 168 hours.  No results for input(s): DDIMER in the last 72 hours.  Cardiac Enzymes No results for input(s): CKMB, TROPONINI, MYOGLOBIN in the last 168 hours.  Invalid input(s): CK ------------------------------------------------------------------------------------------------------------------ Invalid input(s): POCBNP   Recent Labs  04/25/15 0824  GLUCAP 142*     Reagen Goates M.D. [facility] 04/25/2015, 11:01  AM  Pager: [phone] Between 7am to 7pm - call Pager - [phone]  After 7pm go to [email or web address] - password [email or web address]  Call night coverage person covering after 7pm

## 2015-04-25 NOTE — Plan of Care (Signed)
Problem: Phase I Progression Outcomes Goal: OOB as tolerated unless otherwise ordered Outcome: Progressing Up to bedside chair.  Tolerated well.  Problem: Discharge Progression Outcomes Goal: Tolerating diet Outcome: Progressing Tolerated breakfast well.

## 2015-04-25 NOTE — Progress Notes (Signed)
Report called to Brice, Charity fundraiser.  Patient is transferring via wheelchair to room 1414 after she completes her meal. Family at the bedside.

## 2015-04-26 DIAGNOSIS — Z950 Presence of cardiac pacemaker: Secondary | ICD-10-CM

## 2015-04-26 DIAGNOSIS — I10 Essential (primary) hypertension: Secondary | ICD-10-CM | POA: Diagnosis not present

## 2015-04-26 DIAGNOSIS — E119 Type 2 diabetes mellitus without complications: Secondary | ICD-10-CM | POA: Diagnosis not present

## 2015-04-26 DIAGNOSIS — T783XXD Angioneurotic edema, subsequent encounter: Secondary | ICD-10-CM | POA: Diagnosis not present

## 2015-04-26 LAB — BASIC METABOLIC PANEL
Anion gap: 6 (ref 5–15)
BUN: 18 mg/dL (ref 6–20)
CO2: 30 mmol/L (ref 22–32)
CREATININE: 0.75 mg/dL (ref 0.44–1.00)
Calcium: 9.7 mg/dL (ref 8.9–10.3)
Chloride: 103 mmol/L (ref 101–111)
GFR calc Af Amer: >60 mL/min (ref >60)
GLUCOSE: 127 mg/dL — AB (ref 65–99)
POTASSIUM: 3.9 mmol/L (ref 3.5–5.1)
SODIUM: 139 mmol/L (ref 135–145)

## 2015-04-26 LAB — GLUCOSE, CAPILLARY: GLUCOSE-CAPILLARY: 134 mg/dL — AB (ref 65–99)

## 2015-04-26 LAB — C4 COMPLEMENT: COMPLEMENT C4, BODY FLUID: 32 mg/dL (ref 14–44)

## 2015-04-26 LAB — C3 COMPLEMENT: C3 COMPLEMENT: 147 mg/dL (ref 82–167)

## 2015-04-26 MED ORDER — AMLODIPINE BESYLATE 10 MG PO TABS: 10.0000 mg | ORAL_TABLET | Freq: Every day | ORAL | Status: DC

## 2015-04-26 NOTE — Discharge Summary (Signed)
Physician Discharge Summary   Patient ID: Tracey Todd MRN: [id number] DOB/AGE: [birth year] 80 y.o.  Admit date: 04/24/2015 Discharge date: 04/26/2015  Primary Care Physician:  [name], MD  Discharge Diagnoses:    . Angioedema . Essential hypertension . Pacemaker implanted 10/25/14- [employer]  Consults:  None   Recommendations for Outpatient Follow-up:  Patient was recommended to discontinue ACE inhibitor  If there is any further recurrent episodes of angioedema off of ACE inhibitor, will likely need to see allergist immunologist  TESTS THAT NEED FOLLOW-UP C1 esterase inhibitor   DIET: Heart healthy diet    Allergies:   Allergies  Allergen Reactions  . Penicillins Anaphylaxis  . Calcium-Containing Compounds     "calcium deposits on skin"  . Celebrex [Celecoxib] Itching  . Daypro [Oxaprozin] Itching  . Feldene [Piroxicam] Other (See Comments)    Bleeding      Discharge Medications:   Medication List    STOP taking these medications        quinapril 20 MG tablet  Commonly known as:  ACCUPRIL      TAKE these medications        amLODipine 10 MG tablet  Commonly known as:  NORVASC  Take 1 tablet (10 mg total) by mouth daily.     ARTHRITIS PAIN RELIEVER PO  Take 2 capsules by mouth daily as needed (arthritis).     aspirin 81 MG tablet  Take 81 mg by mouth daily.     calcium carbonate 600 MG Tabs tablet  Commonly known as:  OS-CAL  Take 600 mg by mouth daily with breakfast.     freestyle lancets  USE ONE LANCET TO CHECK GLUCOSE EVERY DAY     glucose blood test strip  Commonly known as:  FREESTYLE LITE  ONE STRIP TO CHECK GLUCOSE ONCE DAILY DX E10.9     FREESTYLE LITE test strip  Generic drug:  glucose blood  USE ONE STRIP TO CHECK GLUCOSE ONCE DAILY     hydrochlorothiazide 25 MG tablet  Commonly known as:  HYDRODIURIL  Take 1 tablet (25 mg total) by mouth daily.     Magnesium 500 MG Tabs  Take 500 mg by mouth 2 (two) times  daily.     metFORMIN 500 MG 24 hr tablet  Commonly known as:  GLUCOPHAGE XR  Take 2 tablets twice daily with meals for Diabetes     Omeprazole 20 MG Tbec  Take 1 tablet (20 mg total) by mouth daily.     pravastatin 40 MG tablet  Commonly known as:  PRAVACHOL  TAKE 1 BY MOUTH DAILY AT BEDTIME FOR CHOLESTEROL     Vitamin D 2000 UNITS Caps  Take 6,000 Units by mouth daily.     ZYRTEC ALLERGY PO  Take 1 tablet by mouth daily.         Brief H and P: For complete details please refer to admission H and P, but in brief Tracey Todd is a 80 y.o. female and history of diabetes mellitus type 2, symptomatic bradycardia status post pacemaker placement, diastolic CHF and hyperlipidemia presents to the ER because of swelling of her tongue. Patient started noticing swelling of her tongue last evening around 6 PM. Patient also had some throat swelling like feeling. Since the swelling was persistent patient came to the ER. In the ER patient was not in any acute respiratory distress and did not have any stridor or any wheezing. Denied any itching or skin rash. Denied any new  medications, perfumes or detergents used. Denied any insect bites. Patient was given Solu-Medrol Pepcid and diphenhydramine. Patient was admitted for observation. On exam patient is able to talk and has no stridor and no respiratory problems.   Hospital Course:   Angioedema- Likely due to ACE inhibitor's, no prior history of angioedema or family history Complement C3 and C4 levels normal, C1 esterase panel for complete workup pending. Resolved ACE inhibitor discontinued and patient and family member also explained about it Patient had no stridors or any respiratory compromise. She is tolerating solid diet without any difficulty.    Essential hypertension -BP was somewhat elevated hence Norvasc was increased to 10 mg daily. Continue HCTZ. ACE inhibitor was discontinued.    Pacemaker implanted 10/25/14- [employer]   Diabetes  mellitus type 2, controlled Continue metformin outpatient  Diastolic CHF, chronic 2-D echo in 2/16 showed EF of 55-60%, moderate tricuspid regurgitation, currently euvolemic   Day of Discharge BP 140/65 mmHg  Pulse 60  Temp(Src) 97.8 F (36.6 C) (Oral)  Resp 16  Ht 4\' 10"  (1.473 m)  Wt 81 kg (178 lb 9.2 oz)  BMI 37.33 kg/m2  SpO2 96%  Physical Exam: General: Alert and awake oriented x3 not in any acute distress. HEENT: anicteric sclera, pupils reactive to light and accommodation CVS: S1-S2 clear no murmur rubs or gallops Chest: clear to auscultation bilaterally, no wheezing rales or rhonchi Abdomen: soft nontender, nondistended, normal bowel sounds Extremities: no cyanosis, clubbing or edema noted bilaterally Neuro: Cranial nerves II-XII intact, no focal neurological deficits   The results of significant diagnostics from this hospitalization (including imaging, microbiology, ancillary and laboratory) are listed below for reference.    LAB RESULTS: Basic Metabolic Panel:  Recent Labs Lab 04/25/15 0400 04/26/15 0514  NA 138 139  K 4.0 3.9  CL 103 103  CO2 24 30  GLUCOSE 171* 127*  BUN 21* 18  CREATININE 0.76 0.75  CALCIUM 9.7 9.7   Liver Function Tests: No results for input(s): AST, ALT, ALKPHOS, BILITOT, PROT, ALBUMIN in the last 168 hours. No results for input(s): LIPASE, AMYLASE in the last 168 hours. No results for input(s): AMMONIA in the last 168 hours. CBC:  Recent Labs Lab 04/24/15 2137 04/25/15 0400  WBC 7.7 7.1  NEUTROABS 4.3  --   HGB 11.4* 11.4*  HCT 34.4* 34.9*  MCV 86.0 85.7  PLT 271 270   Cardiac Enzymes: No results for input(s): CKTOTAL, CKMB, CKMBINDEX, TROPONINI in the last 168 hours. BNP: Invalid input(s): POCBNP CBG:  Recent Labs Lab 04/25/15 1637 04/25/15 2128  GLUCAP 160* 124*    Significant Diagnostic Studies:  No results found.  2D ECHO:   Disposition and Follow-up:     Discharge Instructions    Diet Carb  Modified    Complete by:  As directed      Discharge instructions    Complete by:  As directed   Please STOP quinapril.     Increase activity slowly    Complete by:  As directed             DISPOSITION: Home   DISCHARGE FOLLOW-UP Follow-up Information    Follow up with MCKEOWN,WILLIAM DAVID, MD. Schedule an appointment as soon as possible for a visit in 10 days.   Specialty:  Internal Medicine   Why:  for hospital follow-up   Contact information:   [address] [place] [address] [phone]        Time spent on Discharge: 25 minutes  SignedThad Ranger M.D. [facility] 04/26/2015, 12:08 PM Pager: [phone]

## 2015-04-28 LAB — C1 ESTERASE INHIBITOR: C1INH SerPl-mCnc: 34 mg/dL (ref 21–39)

## 2015-04-29 LAB — COMPLEMENT, TOTAL: Compl, Total (CH50): >60 U/mL (ref 42–60)

## 2015-04-30 ENCOUNTER — Encounter: Payer: Self-pay | Admitting: Internal Medicine

## 2015-04-30 ENCOUNTER — Ambulatory Visit (INDEPENDENT_AMBULATORY_CARE_PROVIDER_SITE_OTHER): Payer: Medicare Other | Admitting: *Deleted

## 2015-04-30 DIAGNOSIS — I495 Sick sinus syndrome: Secondary | ICD-10-CM

## 2015-04-30 NOTE — Progress Notes (Signed)
Remote pacemaker transmission.   

## 2015-05-07 ENCOUNTER — Ambulatory Visit (INDEPENDENT_AMBULATORY_CARE_PROVIDER_SITE_OTHER): Payer: Medicare Other | Admitting: Internal Medicine

## 2015-05-07 ENCOUNTER — Encounter: Payer: Self-pay | Admitting: Internal Medicine

## 2015-05-07 ENCOUNTER — Other Ambulatory Visit: Payer: Self-pay

## 2015-05-07 VITALS — BP 126/62 | HR 64 | Temp 97.6°F | Resp 16 | Ht 58.5 in | Wt 176.0 lb

## 2015-05-07 DIAGNOSIS — T783XXA Angioneurotic edema, initial encounter: Secondary | ICD-10-CM | POA: Diagnosis not present

## 2015-05-07 DIAGNOSIS — Z1231 Encounter for screening mammogram for malignant neoplasm of breast: Secondary | ICD-10-CM

## 2015-05-07 NOTE — Progress Notes (Signed)
   Subjective:    Patient ID: Tracey Todd, female    DOB: [birth year], 80 y.o.   MRN: [id number]  HPI  Patient presents to the office for post hospitalization follow-up for angioedema on 04/24/15.  Patient was admitted to the hospital.  She was on quinapril and was subsequently taken off the medication.  She is tolerating solid diet and is also having no further reactions.  Norvasc was increased to 10 mg and quinapril was discontinued. She reports that since stopping the medication no further swelling and no issues swallowing.  She has never had tongue swelling or lip swelling in the past.  She has never had any issues with her ankle swelling. She reports that she has been having no side effects with her medications.    Review of Systems  Constitutional: Negative for fever, chills and fatigue.  Respiratory: Negative for chest tightness, shortness of breath and stridor.   Cardiovascular: Negative for chest pain, palpitations and leg swelling.  Gastrointestinal: Negative for nausea, vomiting, abdominal pain, diarrhea and constipation.  Genitourinary: Negative.   Musculoskeletal: Negative.   Neurological: Negative.        Objective:   Physical Exam  Constitutional: She is oriented to person, place, and time. She appears well-developed and well-nourished. No distress.  HENT:  Head: Normocephalic.  Mouth/Throat: Oropharynx is clear and moist. No oropharyngeal exudate.  Eyes: Conjunctivae are normal. No scleral icterus.  Neck: Normal range of motion. Neck supple. No JVD present. No thyromegaly present.  Cardiovascular: Normal rate, regular rhythm, normal heart sounds and intact distal pulses.  Exam reveals no gallop and no friction rub.   No murmur heard. Pulmonary/Chest: Effort normal and breath sounds normal. No respiratory distress. She has no wheezes. She has no rales. She exhibits no tenderness.  Abdominal: Soft. Bowel sounds are normal. She exhibits no distension and no mass. There is  no tenderness. There is no rebound and no guarding.  Musculoskeletal: Normal range of motion.  Lymphadenopathy:    She has no cervical adenopathy.  Neurological: She is alert and oriented to person, place, and time.  Skin: Skin is warm and dry. She is not diaphoretic.  Psychiatric: She has a normal mood and affect. Her behavior is normal. Judgment and thought content normal.  Nursing note and vitals reviewed.   Filed Vitals:   05/07/15 1119  BP: 126/62  Pulse: 64  Temp: 97.6 F (36.4 C)  Resp: 16      Assessment & Plan:   1. Angioedema, initial encounter -stop quinapril -gave option for testing for angioedema C esterase which patient declined -cont norvasc and HCTZ -contact office if further angioedema

## 2015-05-07 NOTE — Patient Instructions (Signed)

## 2015-05-12 LAB — CUP PACEART REMOTE DEVICE CHECK
Battery Remaining Percentage: 95.5 %
Battery Voltage: 3.02 V
Brady Statistic AP VP Percent: 3 %
Brady Statistic AS VS Percent: 23 %
Brady Statistic RV Percent Paced: 3.1 %
Lead Channel Impedance Value: 590 Ohm
Lead Channel Pacing Threshold Amplitude: 0.625 V
Lead Channel Pacing Threshold Pulse Width: 0.4 ms
Lead Channel Sensing Intrinsic Amplitude: 3.1 mV
Lead Channel Setting Pacing Amplitude: 1.625
MDC IDC MSMT BATTERY REMAINING LONGEVITY: 126 mo
MDC IDC MSMT LEADCHNL RA IMPEDANCE VALUE: 460 Ohm
MDC IDC MSMT LEADCHNL RA PACING THRESHOLD PULSEWIDTH: 0.4 ms
MDC IDC MSMT LEADCHNL RV PACING THRESHOLD AMPLITUDE: 0.75 V
MDC IDC MSMT LEADCHNL RV SENSING INTR AMPL: 10.3 mV
MDC IDC SESS DTM: 20160831060016
MDC IDC SET LEADCHNL RV PACING AMPLITUDE: 1 V
MDC IDC SET LEADCHNL RV PACING PULSEWIDTH: 0.4 ms
MDC IDC SET LEADCHNL RV SENSING SENSITIVITY: 2 mV
MDC IDC STAT BRADY AP VS PERCENT: 73 %
MDC IDC STAT BRADY AS VP PERCENT: 1 %
MDC IDC STAT BRADY RA PERCENT PACED: 76 %
Pulse Gen Serial Number: 7700661

## 2015-05-19 ENCOUNTER — Ambulatory Visit (INDEPENDENT_AMBULATORY_CARE_PROVIDER_SITE_OTHER): Payer: Medicare Other | Admitting: Internal Medicine

## 2015-05-19 ENCOUNTER — Encounter: Payer: Self-pay | Admitting: *Deleted

## 2015-05-19 DIAGNOSIS — S90122A Contusion of left lesser toe(s) without damage to nail, initial encounter: Secondary | ICD-10-CM

## 2015-05-19 DIAGNOSIS — Z23 Encounter for immunization: Secondary | ICD-10-CM

## 2015-05-20 ENCOUNTER — Encounter: Payer: Self-pay | Admitting: Cardiology

## 2015-05-25 ENCOUNTER — Encounter: Payer: Self-pay | Admitting: Internal Medicine

## 2015-05-25 NOTE — Progress Notes (Signed)
  Subjective:    Patient ID: Tracey Todd, female    DOB: 1934-10-31, 80 y.o.   MRN: [id number]  HPI Patient presents with c/o pain in her left 2sd toe after being accidentally stepped on.   Review of Systems 10 point systems review negative except as above.     Objective:   Physical Exam  There were no vitals taken for this visit.  Exam focused on the left foot finds so significant tenderness, STS or deformity of the foot toes, specifically the 2sd toe. No signs of infection and capillary refill is normal.     Assessment & Plan:   1. Contusion, toe, left 2sd by hx  - Patient reassured  - ROV prn  2. Need for prophylactic vaccination and inoculation against influenza  - Flu vaccine HIGH DOSE PF (Fluzone High dose)

## 2015-05-28 ENCOUNTER — Ambulatory Visit
Admission: RE | Admit: 2015-05-28 | Discharge: 2015-05-28 | Disposition: A | Discharge status: Home / Self Care | Payer: Medicare Other | Source: Ambulatory Visit

## 2015-05-28 DIAGNOSIS — Z1231 Encounter for screening mammogram for malignant neoplasm of breast: Secondary | ICD-10-CM

## 2015-07-07 NOTE — Progress Notes (Signed)
Patient ID: Tracey Todd, female   DOB: [birth year], 80 y.o.   MRN: [id number]     CARDIOLOGY OFFICE NOTE  Date:  07/09/2015    Tracey Todd Date of Birth: [birth year] Medical Record #[id number]  PCP:  [name], MD  Cardiologist:  [name]    No chief complaint on file.    History of Present Illness: Tracey Todd is a 80 y.o. female who presents today for a follow up visit.  She has a history of hypertension, diabetes mellitus and hyperlipidemia.   She was admitted earlier this year with fluid overloaded. She was found to have atrial flutter with a slow ventricular response. She was diuresed. PPM was implanted. No coronary disease per cath and EF was normal.  I saw her a month ago for follow up. Some somatic complaints - which seemed more GI in nature. But cardiac status seemed ok. BP was up. I restarted HCTZ.   Comes back today. Here with her husband. Doing ok. She says she is feeling pretty good. No chest pain. Some shoulder soreness but this was present before her PPM implant. BP is lower at home. Trying to watch her salt. GI issues resolved. She is doing well.  In ER August with angioedema  ACE stopped   Past Medical History  Diagnosis Date  . Vitamin D deficiency   . Nodule of chest wall   . Heel pain     left foot  . Hypercholesteremia   . HTN (hypertension)   . Type II or unspecified type diabetes mellitus without mention of complication, not stated as uncontrolled   . DJD (degenerative joint disease)   . Fatty liver   . Normal coronary arteries 2/10/03/14  . Acute CHF (HCC) 10/21/14    secondary to bradycardia    Past Surgical History  Procedure Laterality Date  . Tonsillectomy and adenoidectomy    . Total knee arthroplasty    . Breast surgery    . Cataract extraction    . Left and right heart catheterization with coronary angiogram N/A 10/23/2014    Procedure: LEFT AND RIGHT HEART CATHETERIZATION WITH CORONARY ANGIOGRAM;  Surgeon:  Micheline Chapman, MD;  Location: [facility] CATH LAB;  Service: Cardiovascular;  Laterality: N/A;  . Permanent pacemaker insertion N/A 10/25/2014    Procedure: PERMANENT PACEMAKER INSERTION;  Surgeon: Hillis Range, MD;  Location: [facility] CATH LAB;  Service: Cardiovascular;  Laterality: N/A;     Medications: Current Outpatient Prescriptions  Medication Sig Dispense Refill  . Acetaminophen (ARTHRITIS PAIN RELIEVER PO) Take 2 capsules by mouth daily as needed (arthritis).     Marland Kitchen amLODipine (NORVASC) 10 MG tablet Take 1 tablet (10 mg total) by mouth daily. 30 tablet 3  . aspirin 81 MG tablet Take 81 mg by mouth daily.    . calcium carbonate (OS-CAL) 600 MG TABS tablet Take 600 mg by mouth daily with breakfast.    . Cetirizine HCl (ZYRTEC ALLERGY PO) Take 1 tablet by mouth daily.    . Cholecalciferol (VITAMIN D) 2000 UNITS CAPS Take 6,000 Units by mouth daily.     Marland Kitchen FREESTYLE LITE test strip USE ONE STRIP TO CHECK GLUCOSE ONCE DAILY 100 each 0  . glucose blood (FREESTYLE LITE) test strip ONE STRIP TO CHECK GLUCOSE ONCE DAILY DX E10.9 50 each 2  . hydrochlorothiazide (HYDRODIURIL) 25 MG tablet Take 1 tablet (25 mg total) by mouth daily. 90 tablet 3  . Lancets (FREESTYLE) lancets USE ONE LANCET TO CHECK GLUCOSE  EVERY DAY 100 each 99  . Magnesium 500 MG TABS Take 500 mg by mouth 2 (two) times daily.     . metFORMIN (GLUCOPHAGE-XR) 500 MG 24 hr tablet Take 1,000 mg by mouth 2 (two) times daily.  0  . Omeprazole 20 MG TBEC Take 1 tablet (20 mg total) by mouth daily. (Patient taking differently: Take 20 mg by mouth 2 (two) times daily. ) 90 each 3  . potassium chloride SA (K-DUR,KLOR-CON) 20 MEQ tablet Take 10 mEq by mouth daily.  0  . pravastatin (PRAVACHOL) 40 MG tablet TAKE 1 BY MOUTH DAILY AT BEDTIME FOR CHOLESTEROL (Patient taking differently: Take 40 mg by mouth at bedtime. ) 90 tablet 1   No current facility-administered medications for this visit.    Allergies: Allergies  Allergen Reactions  .  Penicillins Anaphylaxis  . Calcium-Containing Compounds     "calcium deposits on skin"  . Celebrex [Celecoxib] Itching  . Daypro [Oxaprozin] Itching  . Feldene [Piroxicam] Other (See Comments)    Bleeding     Social History: The patient  reports that she quit smoking about 51 years ago. She does not have any smokeless tobacco history on file. She reports that she does not drink alcohol.   Family History: The patient's family history includes Cancer in her father and mother; Diabetes in her mother.   Review of Systems: Please see the history of present illness.   All other systems are reviewed and negative.   Physical Exam: VS:  BP 120/58 mmHg  Pulse 72  Ht 4' 10.5" (1.486 m)  Wt 78.527 kg (173 lb 1.9 oz)  BMI 35.56 kg/m2 .  BMI Body mass index is 35.56 kg/(m^2).  Wt Readings from Last 3 Encounters:  07/09/15 78.527 kg (173 lb 1.9 oz)  05/07/15 79.833 kg (176 lb)  04/25/15 81 kg (178 lb 9.2 oz)    General: Pleasant. Well developed, well nourished and in no acute distress. Weight is down 2 pounds.  HEENT: Normal. Neck: Supple, no JVD, carotid bruits, or masses noted.  Cardiac: Regular rate and rhythm. No murmurs, rubs, or gallops. No edema.  Respiratory:  Lungs are clear to auscultation bilaterally with normal work of breathing.  GI: Soft and nontender.  MS: No deformity or atrophy. Gait and ROM intact. Skin: Warm and dry. Color is normal.  Neuro:  Strength and sensation are intact and no gross focal deficits noted.  Psych: Alert, appropriate and with normal affect.   LABORATORY DATA:  EKG:  May 2016  SR rate 60  Normal   Lab Results  Component Value Date   WBC 7.1 04/25/2015   HGB 11.4* 04/25/2015   HCT 34.9* 04/25/2015   PLT 270 04/25/2015   GLUCOSE 127* 04/26/2015   CHOL 164 04/18/2015   TRIG 66 04/18/2015   HDL 64 04/18/2015   LDLCALC 87 04/18/2015   ALT 7 04/18/2015   AST 12 04/18/2015   NA 139 04/26/2015   K 3.9 04/26/2015   CL 103 04/26/2015    CREATININE 0.75 04/26/2015   BUN 18 04/26/2015   CO2 30 04/26/2015   TSH 1.975 04/18/2015   INR 1.10 10/23/2014   HGBA1C 6.9* 04/18/2015   MICROALBUR 0.8 01/09/2015    BNP (last 3 results)  Recent Labs  10/21/14 1729  BNP 649.3*    ProBNP (last 3 results) No results for input(s): PROBNP in the last 8760 hours.   Other Studies Reviewed Today:  Echo Study Conclusions from 09/2014  - Left ventricle:  The cavity size was normal. Wall thickness was normal. Systolic function was normal. The estimated ejection fraction was in the range of 55% to 60%. Wall motion was normal; there were no regional wall motion abnormalities. - Aortic valve: There was mild regurgitation. - Mitral valve: There was mild regurgitation. - Left atrium: The atrium was mildly dilated. - Right atrium: The atrium was mildly dilated. - Tricuspid valve: There was moderate regurgitation. - Pulmonary arteries: Systolic pressure was moderately increased. PA peak pressure: 62 mm Hg (S).  Impressions:  - The LV systolic function is normal. There is moderate pulmonary hypertension  Coronary angiography: Coronary dominance: right  Left mainstem: Patent vessel without obstruction, mild calcification present  Left anterior descending (LAD): Patent vessel wraps around the LV apex. Diffuse proximal irregularities without obstruction. The diagonals are patent.   Left circumflex (LCx): Large vessel. The OM is widely patent with no obstruction.   Right coronary artery (RCA): Dominant vessel. No obstructive disease throughout.   Left ventriculography: There is mild hypokinesis of the basal inferior wall but the LVEF is normal at 55%  Estimated Blood Loss: minimal  Final Conclusions:  1. Minimal nonobstructive CAD 2. Normal intracardiac filling pressures 3. Mild contraction abnormality of the left ventricle with preserved LVEF  Tracey BollmanMichael Todd 10/23/2014, 6:38 PM   ULTRASOUND ABDOMEN  COMPLETE  COMPARISON: Ultrasound of October 25, 2012.  FINDINGS: Gallbladder: No gallstones or wall thickening visualized. No sonographic Murphy sign noted.  Common bile duct: Diameter: 3.4 mm which is within normal limits.  Liver: No focal lesion identified. Increased echogenicity is noted suggesting fatty infiltration.  IVC: No abnormality visualized.  Pancreas: Visualized portion unremarkable.  Spleen: Size and appearance within normal limits.  Right Kidney: Length: 10.6 cm. 3.8 cm simple cyst is noted in midpole. Echogenicity within normal limits. No mass or hydronephrosis visualized.  Left Kidney: Length: 10.9 cm. Echogenicity within normal limits. No mass or hydronephrosis visualized.  Abdominal aorta: No aneurysm visualized.  Other findings: None.  IMPRESSION: Fatty infiltration of the liver. No other significant abnormality seen in the abdomen.   Electronically Signed  By: Tracey Todd, M.D.  On: 11/13/2014 14:07  Assessment/Plan:  1. Symptomatic bradycardia - now with PPM in place - checked in early March with normal function noted. Seeing Dr. Johney FrameAllred in December   2. HTN - Well controlled.  Continue current medications and low sodium Dash type diet.     3. Atrial flutter/atrial fibrillation - no recurrence noted on recent PPM check - device is followed remotely. Per Dr Johney FrameAllred no anticoagulation AAD for low burden  4. Underlying PPM  5. Abdominal pain - resolved.   Current medicines are reviewed with the patient today.  The patient does not have concerns regarding medicines other than what has been noted above.  The following changes have been made:  See above.  Labs/ tests ordered today include:    No orders of the defined types were placed in this encounter.     Disposition:   FU with Dr Zackery BarefootAllred      Emrick Hensch

## 2015-07-09 ENCOUNTER — Encounter: Payer: Self-pay | Admitting: Cardiovascular Disease

## 2015-07-09 ENCOUNTER — Ambulatory Visit (INDEPENDENT_AMBULATORY_CARE_PROVIDER_SITE_OTHER): Payer: Medicare Other | Admitting: Cardiovascular Disease

## 2015-07-09 VITALS — BP 120/58 | HR 72 | Ht 58.5 in | Wt 173.1 lb

## 2015-07-09 DIAGNOSIS — I10 Essential (primary) hypertension: Secondary | ICD-10-CM | POA: Diagnosis not present

## 2015-07-09 NOTE — Patient Instructions (Signed)
Medication Instructions:  Your physician recommends that you continue on your current medications as directed. Please refer to the Current Medication list given to you today.  Labwork: NONE  Testing/Procedures: NONE  Follow-Up: Your physician wants you to follow-up in: June with Dr. Johney FrameAllred. You will receive a reminder letter in the mail two months in advance. If you don't receive a letter, please call our office to schedule the follow-up appointment.    Any Other Special Instructions Will Be Listed Below (If Applicable).     If you need a refill on your cardiac medications before your next appointment, please call your pharmacy.

## 2015-07-18 ENCOUNTER — Other Ambulatory Visit: Payer: Self-pay | Admitting: Internal Medicine

## 2015-07-20 ENCOUNTER — Encounter: Payer: Self-pay | Admitting: *Deleted

## 2015-07-31 ENCOUNTER — Encounter: Payer: Self-pay | Admitting: Podiatry

## 2015-07-31 ENCOUNTER — Ambulatory Visit (INDEPENDENT_AMBULATORY_CARE_PROVIDER_SITE_OTHER): Payer: Medicare Other | Admitting: Podiatry

## 2015-07-31 DIAGNOSIS — E1121 Type 2 diabetes mellitus with diabetic nephropathy: Secondary | ICD-10-CM

## 2015-07-31 DIAGNOSIS — B351 Tinea unguium: Secondary | ICD-10-CM

## 2015-07-31 DIAGNOSIS — M79676 Pain in unspecified toe(s): Secondary | ICD-10-CM | POA: Diagnosis not present

## 2015-07-31 NOTE — Progress Notes (Signed)
Patient ID: Tracey Todd, female   DOB: 04/26/1935, 80 y.o.   MRN: [id number] Complaint:  Visit Type: Patient returns to my office for continued preventative foot care services. Complaint: Patient states" my nails have grown long and thick and become painful to walk and wear shoes" Patient has been diagnosed with DM with neuropathy.. The patient presents for preventative foot care services. No changes to ROS  Podiatric Exam: Vascular: dorsalis pedis and posterior tibial pulses are palpable bilateral. Capillary return is immediate. Temperature gradient is WNL. Skin turgor WNL  Sensorium: Normal Semmes Weinstein monofilament test. Normal tactile sensation bilaterally. Nail Exam: Pt has thick disfigured discolored nails with subungual debris noted bilateral entire nail hallux through fifth toenails Ulcer Exam: There is no evidence of ulcer or pre-ulcerative changes or infection. Orthopedic Exam: Muscle tone and strength are WNL. No limitations in general ROM. No crepitus or effusions noted. Foot type and digits show no abnormalities. Bony prominences are unremarkable. Skin: No Porokeratosis. No infection or ulcers  Diagnosis:  Onychomycosis, , Pain in right toe, pain in left toes  Treatment & Plan Procedures and Treatment: Consent by patient was obtained for treatment procedures. The patient understood the discussion of treatment and procedures well. All questions were answered thoroughly reviewed. Debridement of mycotic and hypertrophic toenails, 1 through 5 bilateral and clearing of subungual debris. No ulceration, no infection noted. Minimal mechanical debridement requested. Return Visit-Office Procedure: Patient instructed to return to the office for a follow up visit 3 months for continued evaluation and treatment.    Tracey Todd DPM

## 2015-08-06 ENCOUNTER — Encounter: Payer: Self-pay | Admitting: Internal Medicine

## 2015-08-06 ENCOUNTER — Ambulatory Visit (INDEPENDENT_AMBULATORY_CARE_PROVIDER_SITE_OTHER): Payer: Medicare Other | Admitting: Internal Medicine

## 2015-08-06 VITALS — BP 108/64 | HR 72 | Temp 97.3°F | Resp 16 | Ht 58.5 in | Wt 168.8 lb

## 2015-08-06 DIAGNOSIS — E1121 Type 2 diabetes mellitus with diabetic nephropathy: Secondary | ICD-10-CM

## 2015-08-06 DIAGNOSIS — E782 Mixed hyperlipidemia: Secondary | ICD-10-CM

## 2015-08-06 DIAGNOSIS — Z79899 Other long term (current) drug therapy: Secondary | ICD-10-CM

## 2015-08-06 DIAGNOSIS — Z23 Encounter for immunization: Secondary | ICD-10-CM

## 2015-08-06 DIAGNOSIS — R7309 Other abnormal glucose: Secondary | ICD-10-CM

## 2015-08-06 DIAGNOSIS — Z6834 Body mass index (BMI) 34.0-34.9, adult: Secondary | ICD-10-CM

## 2015-08-06 DIAGNOSIS — Z0000 Encounter for general adult medical examination without abnormal findings: Secondary | ICD-10-CM

## 2015-08-06 DIAGNOSIS — Z950 Presence of cardiac pacemaker: Secondary | ICD-10-CM

## 2015-08-06 DIAGNOSIS — E559 Vitamin D deficiency, unspecified: Secondary | ICD-10-CM

## 2015-08-06 DIAGNOSIS — I495 Sick sinus syndrome: Secondary | ICD-10-CM

## 2015-08-06 DIAGNOSIS — I10 Essential (primary) hypertension: Secondary | ICD-10-CM

## 2015-08-06 LAB — LIPID PANEL
CHOLESTEROL: 157 mg/dL (ref 125–200)
HDL: 63 mg/dL (ref >=46)
LDL CALC: 81 mg/dL (ref <130)
TRIGLYCERIDES: 67 mg/dL (ref <150)
Total CHOL/HDL Ratio: 2.5 Ratio (ref <=5.0)
VLDL: 13 mg/dL (ref <30)

## 2015-08-06 LAB — CBC WITH DIFFERENTIAL/PLATELET
BASOS ABS: 0.1 10*3/uL (ref 0.0–0.1)
Basophils Relative: 1 % (ref 0–1)
EOS ABS: 0.1 10*3/uL (ref 0.0–0.7)
EOS PCT: 2 % (ref 0–5)
HCT: 37.3 % (ref 36.0–46.0)
Hemoglobin: 12 g/dL (ref 12.0–15.0)
Lymphocytes Relative: 29 % (ref 12–46)
Lymphs Abs: 1.9 10*3/uL (ref 0.7–4.0)
MCH: 27.3 pg (ref 26.0–34.0)
MCHC: 32.2 g/dL (ref 30.0–36.0)
MCV: 85 fL (ref 78.0–100.0)
MPV: 9.4 fL (ref 8.6–12.4)
Monocytes Absolute: 0.7 10*3/uL (ref 0.1–1.0)
Monocytes Relative: 10 % (ref 3–12)
Neutro Abs: 3.9 10*3/uL (ref 1.7–7.7)
Neutrophils Relative %: 58 % (ref 43–77)
PLATELETS: 286 10*3/uL (ref 150–400)
RBC: 4.39 MIL/uL (ref 3.87–5.11)
RDW: 15.6 % — AB (ref 11.5–15.5)
WBC: 6.7 10*3/uL (ref 4.0–10.5)

## 2015-08-06 LAB — BASIC METABOLIC PANEL WITH GFR
BUN: 19 mg/dL (ref 7–25)
CALCIUM: 10.2 mg/dL (ref 8.6–10.4)
CO2: 29 mmol/L (ref 20–31)
Chloride: 102 mmol/L (ref 98–110)
Creat: 0.72 mg/dL (ref 0.60–0.93)
GFR, EST NON AFRICAN AMERICAN: 80 mL/min (ref >=60)
Glucose, Bld: 162 mg/dL — ABNORMAL HIGH (ref 65–99)
POTASSIUM: 3.7 mmol/L (ref 3.5–5.3)
Sodium: 137 mmol/L (ref 135–146)

## 2015-08-06 LAB — HEPATIC FUNCTION PANEL
ALT: 7 U/L (ref 6–29)
AST: 16 U/L (ref 10–35)
Albumin: 4.4 g/dL (ref 3.6–5.1)
Alkaline Phosphatase: 51 U/L (ref 33–130)
Bilirubin, Direct: 0.1 mg/dL (ref <=0.2)
Indirect Bilirubin: 0.4 mg/dL (ref 0.2–1.2)
Total Bilirubin: 0.5 mg/dL (ref 0.2–1.2)
Total Protein: 7.1 g/dL (ref 6.1–8.1)

## 2015-08-06 LAB — TSH: TSH: 1.555 u[IU]/mL (ref 0.350–4.500)

## 2015-08-06 LAB — MAGNESIUM: Magnesium: 1.7 mg/dL (ref 1.5–2.5)

## 2015-08-06 NOTE — Progress Notes (Signed)
Patient ID: Tracey Todd, female   DOB: [birth year], 80 y.o.   MRN: [id number]   This very nice 80 y.o. WBF presents for 3 month follow up with Hypertension, Hyperlipidemia, Pre-Diabetes and Vitamin D Deficiency.    Patient is treated for HTN since 1995 & BP has been controlled at home. Today's BP: 108/64 mmHg.  In Feb 2016 this year , she had a St Jude PPM impmanted for SSS/pAfib and has done well since. Heart cath was WNL. Patient has had no complaints of any cardiac type chest pain, palpitations, dyspnea/orthopnea/PND, dizziness, claudication, or dependent edema.   Hyperlipidemia is controlled with diet & meds. Patient denies myalgias or other med SE's. Last Lipids were at goal with Cholesterol 164; HDL 64; LDL 87; Triglycerides 66 on 04/18/2015.   Also, the patient has history of T2_NIDDM since 1997 and has had no symptoms of reactive hypoglycemia, diabetic polys, paresthesias or visual blurring.  Last A1c was 6.9% on 04/18/2015.   Further, the patient also has history of Vitamin D Deficiency and supplements vitamin D without any suspected side-effects. Last vitamin D was 65 on 04/18/2015.  Medication Sig  . Acetaminophen  Take 2 capsules by mouth daily as needed (arthritis).   Marland Kitchen amLODipine 10 MG tablet Take 1 tablet (10 mg total) by mouth daily.  Marland Kitchen aspirin 81 MG tablet Take 81 mg by mouth daily.  . OS-CAL 600 MG TABS tablet Take 600 mg by mouth daily with breakfast.  . Cetirizine HCl  Take 1 tablet by mouth daily.  Marland Kitchen VITAMIN D 2000 UNITS CAPS Take 6,000 Units by mouth daily.   . hctz 25 MG tablet Take 1 tablet (25 mg total) by mouth daily.  . Magnesium 500 MG TABS Take 500 mg by mouth 2 (two) times daily.   . metFORMIN -XR 500 MG 24 hr tablet Take 1,000 mg by mouth 2 (two) times daily.  . Omeprazole 20 MG TBEC Take 1 tablet (20 mg total) by mouth daily. (Patient taking differently: Take 20 mg by mouth 2 (two) times daily. )  . potassium chloride SA  20 MEQ tablet Take 10 mEq by mouth daily.   . pravastatin 40 MG tablet TAKE 1 BY MOUTH DAILY AT BEDTIME FOR CHOLESTEROL (Patient taking differently: Take 40 mg by mouth at bedtime. )   Allergies  Allergen Reactions  . Penicillins Anaphylaxis  . Calcium-Containing Compounds     "calcium deposits on skin"  . Celebrex [Celecoxib] Itching  . Daypro [Oxaprozin] Itching  . Feldene [Piroxicam] Other (See Comments)    Bleeding    PMHx:   Past Medical History  Diagnosis Date  . Vitamin D deficiency   . Nodule of chest wall   . Heel pain     left foot  . Hypercholesteremia   . HTN (hypertension)   . Type II or unspecified type diabetes mellitus without mention of complication, not stated as uncontrolled   . DJD (degenerative joint disease)   . Fatty liver   . Normal coronary arteries 2/10/03/14  . Acute CHF (HCC) 10/21/14    secondary to bradycardia   Immunization History  Administered Date(s) Administered  . Influenza Split 05/23/2013  . Influenza, High Dose Seasonal PF 05/30/2014, 05/19/2015  . Pneumococcal Polysaccharide-23 07/29/2013  . Td 08/31/2011   Past Surgical History  Procedure Laterality Date  . Tonsillectomy and adenoidectomy    . Total knee arthroplasty    . Breast surgery    . Cataract extraction    . Left  and right heart catheterization with coronary angiogram N/A 10/23/2014    Procedure: LEFT AND RIGHT HEART CATHETERIZATION WITH CORONARY ANGIOGRAM;  Surgeon: Micheline ChapmanMichael D Cooper, MD;  [facility];  Service: Cardiovascular;  Laterality: N/A;  . Permanent pacemaker insertion N/A 10/25/2014    Procedure: PERMANENT PACEMAKER INSERTION;  Surgeon: Hillis RangeJames Allred, MD;  [facility] LAB;  Service: Cardiovascular;  Laterality: N/A;   FHx:    Reviewed / unchanged  SHx:    Reviewed / unchanged  Systems Review:  Constitutional: Denies fever, chills, wt changes, headaches, insomnia, fatigue, night sweats, change in appetite. Eyes: Denies redness, blurred vision, diplopia, discharge, itchy, watery eyes.   ENT: Denies discharge, congestion, post nasal drip, epistaxis, sore throat, earache, hearing loss, dental pain, tinnitus, vertigo, sinus pain, snoring.  CV: Denies chest pain, palpitations, irregular heartbeat, syncope, dyspnea, diaphoresis, orthopnea, PND, claudication or edema. Respiratory: denies cough, dyspnea, DOE, pleurisy, hoarseness, laryngitis, wheezing.  Gastrointestinal: Denies dysphagia, odynophagia, heartburn, reflux, water brash, abdominal pain or cramps, nausea, vomiting, bloating, diarrhea, constipation, hematemesis, melena, hematochezia  or hemorrhoids. Genitourinary: Denies dysuria, frequency, urgency, nocturia, hesitancy, discharge, hematuria or flank pain. Musculoskeletal: Denies arthralgias, myalgias, stiffness, jt. swelling, pain, limping or strain/sprain.  Skin: Denies pruritus, rash, hives, warts, acne, eczema or change in skin lesion(s). Neuro: No weakness, tremor, incoordination, spasms, paresthesia or pain. Psychiatric: Denies confusion, memory loss or sensory loss. Endo: Denies change in weight, skin or hair change.  Heme/Lymph: No excessive bleeding, bruising or enlarged lymph nodes.  Physical Exam  BP 108/64 mmHg  Pulse 72  Temp(Src) 97.3 F (36.3 C)  Resp 16  Ht 4' 10.5" (1.486 m)  Wt 168 lb 12.8 oz (76.567 kg)  BMI 34.67 kg/m2  Appears well nourished and in no distress. Eyes: PERRLA, EOMs, conjunctiva no swelling or erythema. Sinuses: No frontal/maxillary tenderness ENT/Mouth: EAC's clear, TM's nl w/o erythema, bulging. Nares clear w/o erythema, swelling, exudates. Oropharynx clear without erythema or exudates. Oral hygiene is good. Tongue normal, non obstructing. Hearing intact.  Neck: Supple. Thyroid nl. Car 2+/2+ without bruits, nodes or JVD. Chest: Respirations nl with BS clear & equal w/o rales, rhonchi, wheezing or stridor.  Cor: Heart sounds normal w/ regular rate and rhythm without sig. murmurs, gallops, clicks, or rubs. Peripheral pulses normal  and equal  without edema.  Abdomen: Soft & bowel sounds normal. Non-tender w/o guarding, rebound, hernias, masses, or organomegaly.  Lymphatics: Unremarkable.  Musculoskeletal: Full ROM all peripheral extremities, joint stability, 5/5 strength, and normal gait.  Skin: Warm, dry without exposed rashes, lesions or ecchymosis apparent.  Neuro: Cranial nerves intact, reflexes equal bilaterally. Sensory-motor testing grossly intact. Tendon reflexes grossly intact.  Pysch: Alert & oriented x 3.  Insight and judgement nl & appropriate. No ideations.  Assessment and Plan:  1. Essential hypertension  - US, RETROPERITNL ABD,  LTD  2. Hyperlipidemia  - TSH  3. Controlled type 2 diabetes mellitus with diabetic nephropathy, without long-term current use of insulin (HCC)  - Lipid panel  4. Vitamin D deficiency   5. Type II diabetes mellitus with nephropathy (HCC)   6. Sick sinus syndrome (HCC)   7. Pacemaker implanted 10/25/14- St Jude  - VITAMIN D 25 Hydroxy  8. BMI 34.0-34.9,adult   9. Medication management  - CBC with Differential/Platelet - BASIC METABOLIC PANEL WITH GFR - Hepatic function panel - Magnesium   Recommended regular exercise, BP monitoring, weight control, and discussed med and SE's. Recommended labs to assess and monitor clinical status. Further disposition pending  results of labs. Over 30 minutes of exam, counseling, chart review was performed

## 2015-08-06 NOTE — Addendum Note (Signed)
Addended by: Valrie HartEVANS, Manya Balash C on: 08/06/2015 12:11 PM   Modules accepted: Orders

## 2015-08-06 NOTE — Patient Instructions (Signed)

## 2015-08-07 ENCOUNTER — Telehealth: Payer: Self-pay | Admitting: Internal Medicine

## 2015-08-07 LAB — HEMOGLOBIN A1C
Hgb A1c MFr Bld: 6.4 % — ABNORMAL HIGH (ref <5.7)
MEAN PLASMA GLUCOSE: 137 mg/dL — AB (ref <117)

## 2015-08-07 LAB — INSULIN, RANDOM: INSULIN: 15.8 u[IU]/mL (ref 2.0–19.6)

## 2015-08-07 LAB — VITAMIN D 25 HYDROXY (VIT D DEFICIENCY, FRACTURES): VIT D 25 HYDROXY: 80 ng/mL (ref 30–100)

## 2015-08-07 NOTE — Telephone Encounter (Signed)
Follow Up   Pt called requests a call back to discuss how the remote signal is sent. Please call

## 2015-08-07 NOTE — Telephone Encounter (Signed)
Spoke w/ pt and informed her that her Monday remote transmission will be automatic and if we don't have it by 12 noon that day we will call her. Pt verbalized understanding.

## 2015-08-11 ENCOUNTER — Ambulatory Visit (INDEPENDENT_AMBULATORY_CARE_PROVIDER_SITE_OTHER): Payer: Medicare Other | Admitting: *Deleted

## 2015-08-11 ENCOUNTER — Telehealth: Payer: Self-pay | Admitting: Internal Medicine

## 2015-08-11 DIAGNOSIS — I495 Sick sinus syndrome: Secondary | ICD-10-CM | POA: Diagnosis not present

## 2015-08-11 NOTE — Telephone Encounter (Signed)
Informed pt that her transmission was received. She verbalized understanding.  

## 2015-08-11 NOTE — Progress Notes (Signed)
Remote pacemaker transmission.   

## 2015-08-11 NOTE — Telephone Encounter (Signed)
New Message  Pt calling to see if remote transmission was sent successfully. Please call back and discuss.   

## 2015-08-12 ENCOUNTER — Encounter: Payer: Self-pay | Admitting: Cardiology

## 2015-08-12 LAB — CUP PACEART REMOTE DEVICE CHECK
Battery Remaining Longevity: 124 mo
Brady Statistic AP VS Percent: 65 %
Brady Statistic RV Percent Paced: 2.1 %
Date Time Interrogation Session: 20161211090018
Implantable Lead Implant Date: 20160226
Implantable Lead Location: 753860
Lead Channel Impedance Value: 460 Ohm
Lead Channel Pacing Threshold Amplitude: 1 V
Lead Channel Setting Pacing Pulse Width: 0.4 ms
Lead Channel Setting Sensing Sensitivity: 2 mV
MDC IDC LEAD IMPLANT DT: 20160226
MDC IDC LEAD LOCATION: 753859
MDC IDC LEAD MODEL: 1948
MDC IDC MSMT BATTERY REMAINING PERCENTAGE: 95.5 %
MDC IDC MSMT BATTERY VOLTAGE: 3.02 V
MDC IDC MSMT LEADCHNL RA PACING THRESHOLD AMPLITUDE: 0.875 V
MDC IDC MSMT LEADCHNL RA PACING THRESHOLD PULSEWIDTH: 0.4 ms
MDC IDC MSMT LEADCHNL RA SENSING INTR AMPL: 2.9 mV
MDC IDC MSMT LEADCHNL RV IMPEDANCE VALUE: 550 Ohm
MDC IDC MSMT LEADCHNL RV PACING THRESHOLD PULSEWIDTH: 0.4 ms
MDC IDC MSMT LEADCHNL RV SENSING INTR AMPL: 7.7 mV
MDC IDC SET LEADCHNL RA PACING AMPLITUDE: 1.875
MDC IDC SET LEADCHNL RV PACING AMPLITUDE: 1.25 V
MDC IDC STAT BRADY AP VP PERCENT: 2 %
MDC IDC STAT BRADY AS VP PERCENT: 1 %
MDC IDC STAT BRADY AS VS PERCENT: 33 %
MDC IDC STAT BRADY RA PERCENT PACED: 66 %
Pulse Gen Model: 2240
Pulse Gen Serial Number: 7700661

## 2015-09-11 ENCOUNTER — Ambulatory Visit (INDEPENDENT_AMBULATORY_CARE_PROVIDER_SITE_OTHER): Payer: Medicare Other | Admitting: Internal Medicine

## 2015-09-11 ENCOUNTER — Encounter: Payer: Self-pay | Admitting: Internal Medicine

## 2015-09-11 VITALS — BP 128/62 | HR 60 | Temp 98.0°F | Resp 18 | Ht 58.5 in | Wt 172.0 lb

## 2015-09-11 DIAGNOSIS — J069 Acute upper respiratory infection, unspecified: Secondary | ICD-10-CM

## 2015-09-11 MED ORDER — AZITHROMYCIN 250 MG PO TABS: ORAL_TABLET | ORAL | Status: DC

## 2015-09-11 MED ORDER — PREDNISONE 20 MG PO TABS: ORAL_TABLET | ORAL | Status: DC

## 2015-09-11 MED ORDER — PROMETHAZINE-DM 6.25-15 MG/5ML PO SYRP
ORAL_SOLUTION | ORAL | Status: DC
Start: 2015-09-11 — End: 2015-11-20

## 2015-09-11 NOTE — Progress Notes (Signed)
Patient ID: Tracey Todd, female   DOB: 07/27/1935, 81 y.o.   MRN: [id number]  HPI  Patient presents to the office for evaluation of cough.  It has been going on for 1 weeks.  Patient reports all the time, wet with green mucous production.  They also endorse change in voice, postnasal drip, sputum production and nasal congestion, rhinorrhea, sore throat, fatigue..  They have tried delsym, mucinex, honey.  They report that nothing has worked.  They denies other sick contacts.  Review of Systems  Constitutional: Negative for fever, chills and malaise/fatigue.  HENT: Positive for congestion and sore throat. Negative for ear pain.   Respiratory: Positive for cough and sputum production. Negative for shortness of breath and wheezing.   Cardiovascular: Negative for chest pain, palpitations and leg swelling.  Skin: Negative.   Neurological: Negative for headaches.    PE:  General:  Alert and non-toxic, WDWN, NAD HEENT: NCAT, PERLA, EOM normal, no occular discharge or erythema.  Nasal mucosal edema with sinus tenderness to palpation.  Oropharynx clear with minimal oropharyngeal edema and erythema.  Mucous membranes moist and pink. Neck:  Cervical adenopathy Chest:  RRR no MRGs.  Lungs clear to auscultation A&P with no wheezes rhonchi or rales.   Abdomen: +BS x 4 quadrants, soft, non-tender, no guarding, rigidity, or rebound. Skin: warm and dry no rash Neuro: A&Ox4, CN II-XII grossly intact  Assessment and Plan:   1. Acute URI -nasal saline -zyrtec - promethazine-dextromethorphan (PROMETHAZINE-DM) 6.25-15 MG/5ML syrup; Take 5 mL PO q8hrs prn for severe coughing.  Dispense: 360 mL; Refill: 1 - predniSONE (DELTASONE) 20 MG tablet; 3 tabs po day one, then 2 tabs daily x 4 days  Dispense: 11 tablet; Refill: 0 - azithromycin (ZITHROMAX Z-PAK) 250 MG tablet; 2 po day one, then 1 daily x 4 days  Dispense: 6 tablet; Refill: 0

## 2015-09-11 NOTE — Patient Instructions (Signed)
Please take cough syrup up to 3 times a day as needed for coughing and congestion.  Please use saline in your nose as often as tolerated.  Please take the prednisone until gone.  Please make sure you take the zpak until gone.  Please use zyrtec daily.

## 2015-09-22 ENCOUNTER — Emergency Department (HOSPITAL_COMMUNITY): Payer: Medicare Other

## 2015-09-22 ENCOUNTER — Encounter (HOSPITAL_COMMUNITY): Payer: Self-pay | Admitting: Emergency Medicine

## 2015-09-22 ENCOUNTER — Emergency Department (HOSPITAL_COMMUNITY)
Admission: EM | Admit: 2015-09-22 | Discharge: 2015-09-22 | Disposition: A | Discharge status: Home / Self Care | Payer: Medicare Other | Attending: Emergency Medicine | Admitting: Emergency Medicine

## 2015-09-22 DIAGNOSIS — Z880 Allergy status to penicillin: Secondary | ICD-10-CM | POA: Insufficient documentation

## 2015-09-22 DIAGNOSIS — E7800 Pure hypercholesterolemia, unspecified: Secondary | ICD-10-CM | POA: Insufficient documentation

## 2015-09-22 DIAGNOSIS — Z87891 Personal history of nicotine dependence: Secondary | ICD-10-CM | POA: Diagnosis not present

## 2015-09-22 DIAGNOSIS — I509 Heart failure, unspecified: Secondary | ICD-10-CM | POA: Insufficient documentation

## 2015-09-22 DIAGNOSIS — Z8719 Personal history of other diseases of the digestive system: Secondary | ICD-10-CM | POA: Insufficient documentation

## 2015-09-22 DIAGNOSIS — M1990 Unspecified osteoarthritis, unspecified site: Secondary | ICD-10-CM | POA: Diagnosis not present

## 2015-09-22 DIAGNOSIS — Z950 Presence of cardiac pacemaker: Secondary | ICD-10-CM | POA: Diagnosis not present

## 2015-09-22 DIAGNOSIS — E119 Type 2 diabetes mellitus without complications: Secondary | ICD-10-CM | POA: Diagnosis not present

## 2015-09-22 DIAGNOSIS — Z7984 Long term (current) use of oral hypoglycemic drugs: Secondary | ICD-10-CM | POA: Insufficient documentation

## 2015-09-22 DIAGNOSIS — I10 Essential (primary) hypertension: Secondary | ICD-10-CM | POA: Insufficient documentation

## 2015-09-22 DIAGNOSIS — Z9889 Other specified postprocedural states: Secondary | ICD-10-CM | POA: Diagnosis not present

## 2015-09-22 DIAGNOSIS — Z7982 Long term (current) use of aspirin: Secondary | ICD-10-CM | POA: Insufficient documentation

## 2015-09-22 DIAGNOSIS — M25551 Pain in right hip: Secondary | ICD-10-CM | POA: Insufficient documentation

## 2015-09-22 DIAGNOSIS — M5441 Lumbago with sciatica, right side: Secondary | ICD-10-CM | POA: Insufficient documentation

## 2015-09-22 DIAGNOSIS — Z79899 Other long term (current) drug therapy: Secondary | ICD-10-CM | POA: Diagnosis not present

## 2015-09-22 DIAGNOSIS — E559 Vitamin D deficiency, unspecified: Secondary | ICD-10-CM | POA: Insufficient documentation

## 2015-09-22 MED ORDER — OXYCODONE-ACETAMINOPHEN 5-325 MG PO TABS: 1.0000 | ORAL_TABLET | Freq: Four times a day (QID) | ORAL | Status: DC | PRN

## 2015-09-22 MED ORDER — OXYCODONE-ACETAMINOPHEN 5-325 MG PO TABS
1.0000 | ORAL_TABLET | Freq: Once | ORAL | Status: AC
  Administered 2015-09-22: 1 via ORAL
  Filled 2015-09-22: qty 1

## 2015-09-22 NOTE — ED Notes (Signed)
Patient transported to X-ray 

## 2015-09-22 NOTE — ED Notes (Signed)
Pt c/o cramping hip pain onset a couple of days ago. No trauma. States that pain onset began after taking antibiotics and prednisone for respiratory infection.

## 2015-09-22 NOTE — Discharge Instructions (Signed)
Follow-up with Dr. Oneta Rack for further evaluation of the findings on the right hip.  Hip Pain Your hip is the joint between your upper legs and your lower pelvis. The bones, cartilage, tendons, and muscles of your hip joint perform a lot of work each day supporting your body weight and allowing you to move around. Hip pain can range from a minor ache to severe pain in one or both of your hips. Pain may be felt on the inside of the hip joint near the groin, or the outside near the buttocks and upper thigh. You may have swelling or stiffness as well.  HOME CARE INSTRUCTIONS   Take medicines only as directed by your health care provider.  Apply ice to the injured area:  Put ice in a plastic bag.  Place a towel between your skin and the bag.  Leave the ice on for 15-20 minutes at a time, 3-4 times a day.  Keep your leg raised (elevated) when possible to lessen swelling.  Avoid activities that cause pain.  Follow specific exercises as directed by your health care provider.  Sleep with a pillow between your legs on your most comfortable side.  Record how often you have hip pain, the location of the pain, and what it feels like. SEEK MEDICAL CARE IF:   You are unable to put weight on your leg.  Your hip is red or swollen or very tender to touch.  Your pain or swelling continues or worsens after 1 week.  You have increasing difficulty walking.  You have a fever. SEEK IMMEDIATE MEDICAL CARE IF:   You have fallen.  You have a sudden increase in pain and swelling in your hip. MAKE SURE YOU:   Understand these instructions.  Will watch your condition.  Will get help right away if you are not doing well or get worse.   This information is not intended to replace advice given to you by your health care provider. Make sure you discuss any questions you have with your health care provider.   Document Released: 02/03/2010 Document Revised: 09/06/2014 Document Reviewed:  04/12/2013 Elsevier Interactive Patient Education 2016 [employer]  Sciatica Sciatica is pain, weakness, numbness, or tingling along the path of the sciatic nerve. The nerve starts in the lower back and runs down the back of each leg. The nerve controls the muscles in the lower leg and in the back of the knee, while also providing sensation to the back of the thigh, lower leg, and the sole of your foot. Sciatica is a symptom of another medical condition. For instance, nerve damage or certain conditions, such as a herniated disk or bone spur on the spine, pinch or put pressure on the sciatic nerve. This causes the pain, weakness, or other sensations normally associated with sciatica. Generally, sciatica only affects one side of the body. CAUSES   Herniated or slipped disc.  Degenerative disk disease.  A pain disorder involving the narrow muscle in the buttocks (piriformis syndrome).  Pelvic injury or fracture.  Pregnancy.  Tumor (rare). SYMPTOMS  Symptoms can vary from mild to very severe. The symptoms usually travel from the low back to the buttocks and down the back of the leg. Symptoms can include:  Mild tingling or dull aches in the lower back, leg, or hip.  Numbness in the back of the calf or sole of the foot.  Burning sensations in the lower back, leg, or hip.  Sharp pains in the lower back, leg, or  hip.  Leg weakness.  Severe back pain inhibiting movement. These symptoms may get worse with coughing, sneezing, laughing, or prolonged sitting or standing. Also, being overweight may worsen symptoms. DIAGNOSIS  Your caregiver will perform a physical exam to look for common symptoms of sciatica. He or she may ask you to do certain movements or activities that would trigger sciatic nerve pain. Other tests may be performed to find the cause of the sciatica. These may include:  Blood tests.  X-rays.  Imaging tests, such as an MRI or CT scan. TREATMENT  Treatment is directed  at the cause of the sciatic pain. Sometimes, treatment is not necessary and the pain and discomfort goes away on its own. If treatment is needed, your caregiver may suggest:  Over-the-counter medicines to relieve pain.  Prescription medicines, such as anti-inflammatory medicine, muscle relaxants, or narcotics.  Applying heat or ice to the painful area.  Steroid injections to lessen pain, irritation, and inflammation around the nerve.  Reducing activity during periods of pain.  Exercising and stretching to strengthen your abdomen and improve flexibility of your spine. Your caregiver may suggest losing weight if the extra weight makes the back pain worse.  Physical therapy.  Surgery to eliminate what is pressing or pinching the nerve, such as a bone spur or part of a herniated disk. HOME CARE INSTRUCTIONS   Only take over-the-counter or prescription medicines for pain or discomfort as directed by your caregiver.  Apply ice to the affected area for 20 minutes, 3-4 times a day for the first 48-72 hours. Then try heat in the same way.  Exercise, stretch, or perform your usual activities if these do not aggravate your pain.  Attend physical therapy sessions as directed by your caregiver.  Keep all follow-up appointments as directed by your caregiver.  Do not wear high heels or shoes that do not provide proper support.  Check your mattress to see if it is too soft. A firm mattress may lessen your pain and discomfort. SEEK IMMEDIATE MEDICAL CARE IF:   You lose control of your bowel or bladder (incontinence).  You have increasing weakness in the lower back, pelvis, buttocks, or legs.  You have redness or swelling of your back.  You have a burning sensation when you urinate.  You have pain that gets worse when you lie down or awakens you at night.  Your pain is worse than you have experienced in the past.  Your pain is lasting longer than 4 weeks.  You are suddenly losing weight  without reason. MAKE SURE YOU:  Understand these instructions.  Will watch your condition.  Will get help right away if you are not doing well or get worse.   This information is not intended to replace advice given to you by your health care provider. Make sure you discuss any questions you have with your health care provider.   Document Released: 08/10/2001 Document Revised: 05/07/2015 Document Reviewed: 12/26/2011 Elsevier Interactive Patient Education [employer]

## 2015-09-22 NOTE — ED Provider Notes (Signed)
CSN: [id number]     Arrival date & time 09/22/15  7829 History   First MD Initiated Contact with Patient 09/22/15 1012     Chief Complaint  Patient presents with  . Hip Pain      Patient is a 81 y.o. female presenting with hip pain. The history is provided by the patient.  Hip Pain This is a new problem. Pertinent negatives include no chest pain and no shortness of breath.   patient presents with pain in her right hip going down her right leg. She's had for last couple days. No trauma. No chest pain or trouble breathing. No swelling. No fevers. No fall. States she's not had pains like this before. The pain is dull and constant and worse with movement or sitting. States she has trouble having a bowel movement was the sitting down hurts. She had recently been on antibiotics and sterile it's for upper respiratory infection. States she's finished this and the respiratory infection is cleared up.  Past Medical History  Diagnosis Date  . Vitamin D deficiency   . Nodule of chest wall   . Heel pain     left foot  . Hypercholesteremia   . HTN (hypertension)   . Type II or unspecified type diabetes mellitus without mention of complication, not stated as uncontrolled   . DJD (degenerative joint disease)   . Fatty liver   . Normal coronary arteries 2/10/03/14  . Acute CHF (HCC) 10/21/14    secondary to bradycardia   Past Surgical History  Procedure Laterality Date  . Tonsillectomy and adenoidectomy    . Total knee arthroplasty    . Breast surgery    . Cataract extraction    . Left and right heart catheterization with coronary angiogram N/A 10/23/2014    Procedure: LEFT AND RIGHT HEART CATHETERIZATION WITH CORONARY ANGIOGRAM;  Surgeon: Micheline Chapman, MD;  Location: [facility] CATH LAB;  Service: Cardiovascular;  Laterality: N/A;  . Permanent pacemaker insertion N/A 10/25/2014    Procedure: PERMANENT PACEMAKER INSERTION;  Surgeon: Hillis Range, MD;  Location: [facility] CATH LAB;  Service: Cardiovascular;   Laterality: N/A;   Family History  Problem Relation Age of Onset  . Diabetes Mother   . Cancer Mother   . Cancer Father    Social History  Substance Use Topics  . Smoking status: Former Smoker    Quit date: 12/06/1963  . Smokeless tobacco: None  . Alcohol Use: No   OB History    No data available     Review of Systems  Constitutional: Negative for appetite change.  Eyes: Negative for pain.  Respiratory: Negative for shortness of breath.   Cardiovascular: Negative for chest pain and leg swelling.  Musculoskeletal: Positive for back pain. Negative for joint swelling and neck pain.  Skin: Negative for color change and rash.  Neurological: Negative for weakness and numbness.      Allergies  Penicillins; Calcium-containing compounds; Celebrex; Daypro; and Feldene  Home Medications   Prior to Admission medications   Medication Sig Start Date End Date Taking? Authorizing Provider  Acetaminophen (ARTHRITIS PAIN RELIEVER PO) Take 2 capsules by mouth daily as needed (arthritis).    Yes Historical Provider, MD  amLODipine (NORVASC) 10 MG tablet Take 1 tablet (10 mg total) by mouth daily. 04/26/15  Yes Ripudeep Jenna Luo, MD  aspirin 81 MG tablet Take 81 mg by mouth daily.   Yes Historical Provider, MD  calcium carbonate (OS-CAL) 600 MG TABS tablet Take 600 mg by  mouth daily with breakfast.   Yes Historical Provider, MD  Cetirizine HCl (ZYRTEC ALLERGY PO) Take 1 tablet by mouth daily. 11/28/12  Yes Historical Provider, MD  Cholecalciferol (VITAMIN D) 2000 UNITS CAPS Take 6,000 Units by mouth daily.    Yes Historical Provider, MD  hydrochlorothiazide (HYDRODIURIL) 25 MG tablet Take 1 tablet (25 mg total) by mouth daily. 11/29/14  Yes Rosalio Macadamia, NP  Magnesium 500 MG TABS Take 500 mg by mouth 3 (three) times daily.    Yes Historical Provider, MD  metFORMIN (GLUCOPHAGE-XR) 500 MG 24 hr tablet Take 1,000 mg by mouth 2 (two) times daily. 04/18/15  Yes Historical Provider, MD  naproxen sodium  (ANAPROX) 220 MG tablet Take 440 mg by mouth daily as needed (pain).   Yes Historical Provider, MD  Omeprazole 20 MG TBEC Take 1 tablet (20 mg total) by mouth daily. Patient taking differently: Take 20 mg by mouth 2 (two) times daily.  03/12/15  Yes Lucky Cowboy, MD  potassium chloride SA (K-DUR,KLOR-CON) 20 MEQ tablet Take 10 mEq by mouth daily. 06/06/15  Yes Historical Provider, MD  pravastatin (PRAVACHOL) 40 MG tablet TAKE 1 BY MOUTH DAILY AT BEDTIME FOR CHOLESTEROL Patient taking differently: Take 40 mg by mouth at bedtime.  04/18/15  Yes Courtney Forcucci, PA-C  promethazine-dextromethorphan (PROMETHAZINE-DM) 6.25-15 MG/5ML syrup Take 5 mL PO q8hrs prn for severe coughing. 09/11/15  Yes Courtney Forcucci, PA-C  azithromycin (ZITHROMAX Z-PAK) 250 MG tablet 2 po day one, then 1 daily x 4 days 09/11/15   Terri Piedra, PA-C  FREESTYLE LITE test strip USE ONE STRIP TO CHECK GLUCOSE ONCE DAILY 07/18/15   Lucky Cowboy, MD  Lancets (FREESTYLE) lancets USE ONE LANCET TO CHECK GLUCOSE EVERY DAY 11/16/14   Lucky Cowboy, MD  oxyCODONE-acetaminophen (PERCOCET/ROXICET) 5-325 MG tablet Take 1 tablet by mouth every 6 (six) hours as needed for severe pain. 09/22/15   Benjiman Core, MD  predniSONE (DELTASONE) 20 MG tablet 3 tabs po day one, then 2 tabs daily x 4 days 09/11/15   Toni Amend Forcucci, PA-C   BP 163/69 mmHg  Pulse 65  Temp(Src) 98.3 F (36.8 C) (Oral)  Resp 18  SpO2 94% Physical Exam  Constitutional: She appears well-developed.  Cardiovascular: Normal rate.   Pulmonary/Chest: Effort normal.  Abdominal: There is no tenderness.  Musculoskeletal: She exhibits no edema.  Pain with straight leg raise on right. Strong dorsalis pedis pulse on right foot. No edema on extremity's. Good flexion extension at ankle and knees. Some tenderness in right SI joint area. Also some tenderness over right hip somewhat laterally and posteriorly. No rash.  Neurological: She is alert.  Skin: Skin is warm.     ED Course  Procedures (including critical care time) Labs Review Labs Reviewed - No data to display  Imaging Review Dg Hip Unilat With Pelvis 2-3 Views Right  09/22/2015  CLINICAL DATA:  New onset pain for 1 day. No known history of trauma EXAM: DG HIP (WITH OR WITHOUT PELVIS) 2-3V RIGHT COMPARISON:  None. FINDINGS: Frontal pelvis as well as frontal and lateral right hip images were obtained. There is no acute fracture or dislocation. There is mild symmetric narrowing of both hip joints. There is a cystic area in the midportion of the right humeral head seen only on the frontal view. No erosive change. There is mild osteoarthritic change in the pubic symphysis region. IMPRESSION: Mild symmetric narrowing both hip joints. There is a cystic appearing area in the mid femoral head region on  the right, seen only on the frontal view, measuring 1.2 x 1.0 cm. 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 No fracture or dislocation. The etiology for this cystic area in the mid right femoral head is uncertain. If symptoms persist, it may be reasonable to correlate with MR of the hips to assess for possible early changes of avascular necrosis presenting in this manner. Electronically Signed   By: Bretta Bang III M.D.   On: 09/22/2015 10:39   I have personally reviewed and evaluated these images and lab results as part of my medical decision-making.   EKG Interpretation None      MDM   Final diagnoses:  Right-sided low back pain with right-sided sciatica  Hip pain, right    Patient with right back/hip pain. May be sciatica since it does radiate down the leg. Doubt DVT as there is no swelling. X-ray done due to some tenderness over the right hip. X-ray showed one view the head lucency on the femoral head. Will follow-up with her primary care doctor for this. Will discharge with 10 pills of Percocet for the pain.  Benjiman Core, MD 09/22/15 1204

## 2015-09-23 ENCOUNTER — Encounter: Payer: Self-pay | Admitting: Internal Medicine

## 2015-09-23 ENCOUNTER — Ambulatory Visit (INDEPENDENT_AMBULATORY_CARE_PROVIDER_SITE_OTHER): Payer: Medicare Other | Admitting: Physician Assistant

## 2015-09-23 VITALS — BP 132/76 | HR 60 | Temp 97.7°F | Resp 16

## 2015-09-23 DIAGNOSIS — K5909 Other constipation: Secondary | ICD-10-CM | POA: Diagnosis not present

## 2015-09-23 DIAGNOSIS — M5441 Lumbago with sciatica, right side: Secondary | ICD-10-CM | POA: Diagnosis not present

## 2015-09-23 DIAGNOSIS — M25551 Pain in right hip: Secondary | ICD-10-CM | POA: Diagnosis not present

## 2015-09-23 MED ORDER — PREDNISONE 20 MG PO TABS: ORAL_TABLET | ORAL | Status: AC

## 2015-09-23 MED ORDER — HYDROCODONE-ACETAMINOPHEN 5-325 MG PO TABS: ORAL_TABLET | ORAL | Status: DC

## 2015-09-23 MED ORDER — NALOXEGOL OXALATE 25 MG PO TABS
25.0000 mg | ORAL_TABLET | Freq: Every day | ORAL | Status: DC
Start: 2015-09-23 — End: 2016-02-04

## 2015-09-23 MED ORDER — PREGABALIN 75 MG PO CAPS: 75.0000 mg | ORAL_CAPSULE | Freq: Three times a day (TID) | ORAL | Status: DC

## 2015-09-23 NOTE — Progress Notes (Signed)
Assessment and Plan: Lower back pain with right sciatica and or right hip OA rule out AVN Will send over to Dr. Butler Denmark. Elita Quick, prednisone, non weight bearing, percocet  Constipation Benign AB, movantik samples, stool softner   HPI 81 y.o. obese AAF with history of SSS s/p pacemaker, afib, CHF, DM last GFR 89 presents for follow up from the ER. Patient went to the ER on 09/22/15, for: right hip/back pain going down "all over her leg".. She states there was no injury, started to have right hip Thurs/Friday, much worse Sunday, she went to the ER yesterday. Pain is worse with weight bearing/walking, and worse when she sits down, lying flat helps the pain. Denies weakness/her leg giving out on her but her leg does feel "heavy".  Has taken 4 total percocet since yesterday and states that it does not help. She has some constipation. Daughter is here and states has been complaining x weeks.   Lab Results  Component Value Date   GFRAA >89 08/06/2015   Dg Hip Unilat With Pelvis 2-3 Views Right  09/22/2015  CLINICAL DATA:  New onset pain for 1 day. No known history of trauma EXAM: DG HIP (WITH OR WITHOUT PELVIS) 2-3V RIGHT COMPARISON:  None. FINDINGS: Frontal pelvis as well as frontal and lateral right hip images were obtained. There is no acute fracture or dislocation. There is mild symmetric narrowing of both hip joints. There is a cystic area in the midportion of the right humeral head seen only on the frontal view. No erosive change. There is mild osteoarthritic change in the pubic symphysis region. IMPRESSION: Mild symmetric narrowing both hip joints. There is a cystic appearing area in the mid femoral head region on the right, seen only on the frontal view, measuring 1.2 x 1.0 cm. 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 No fracture or dislocation. The etiology for this cystic area in the mid right femoral head is uncertain. If symptoms persist, it may be reasonable to correlate with MR of  the hips to assess for possible early changes of avascular necrosis presenting in this manner. Electronically Signed   By: Bretta Bang III M.D.   On: 09/22/2015 10:39     Past Medical History  Diagnosis Date  . Vitamin D deficiency   . Nodule of chest wall   . Heel pain     left foot  . Hypercholesteremia   . HTN (hypertension)   . Type II or unspecified type diabetes mellitus without mention of complication, not stated as uncontrolled   . DJD (degenerative joint disease)   . Fatty liver   . Normal coronary arteries 2/10/03/14  . Acute CHF (HCC) 10/21/14    secondary to bradycardia     Allergies  Allergen Reactions  . Penicillins Anaphylaxis    Has patient had a PCN reaction causing immediate rash, facial/tongue/throat swelling, SOB or lightheadedness with hypotension: Yes Has patient had a PCN reaction causing severe rash involving mucus membranes or skin necrosis: No Has patient had a PCN reaction that required hospitalization No Has patient had a PCN reaction occurring within the last 10 years: No If all of the above answers are "NO", then may proceed with Cephalosporin use.   . Calcium-Containing Compounds     "calcium deposits on skin"  . Celebrex [Celecoxib] Itching  . Daypro [Oxaprozin] Itching  . Feldene [Piroxicam] Other (See Comments)    Bleeding       Current Outpatient Prescriptions on File Prior to Visit  Medication  Sig Dispense Refill  . Acetaminophen (ARTHRITIS PAIN RELIEVER PO) Take 2 capsules by mouth daily as needed (arthritis).     Marland Kitchen amLODipine (NORVASC) 10 MG tablet Take 1 tablet (10 mg total) by mouth daily. 30 tablet 3  . aspirin 81 MG tablet Take 81 mg by mouth daily.    . calcium carbonate (OS-CAL) 600 MG TABS tablet Take 600 mg by mouth daily with breakfast.    . Cetirizine HCl (ZYRTEC ALLERGY PO) Take 1 tablet by mouth daily.    . Cholecalciferol (VITAMIN D) 2000 UNITS CAPS Take 6,000 Units by mouth daily.     Marland Kitchen FREESTYLE LITE test strip USE  ONE STRIP TO CHECK GLUCOSE ONCE DAILY 100 each 99  . hydrochlorothiazide (HYDRODIURIL) 25 MG tablet Take 1 tablet (25 mg total) by mouth daily. 90 tablet 3  . Lancets (FREESTYLE) lancets USE ONE LANCET TO CHECK GLUCOSE EVERY DAY 100 each 99  . Magnesium 500 MG TABS Take 500 mg by mouth 3 (three) times daily.     . metFORMIN (GLUCOPHAGE-XR) 500 MG 24 hr tablet Take 1,000 mg by mouth 2 (two) times daily.  0  . naproxen sodium (ANAPROX) 220 MG tablet Take 440 mg by mouth daily as needed (pain).    . Omeprazole 20 MG TBEC Take 1 tablet (20 mg total) by mouth daily. (Patient taking differently: Take 20 mg by mouth 2 (two) times daily. ) 90 each 3  . oxyCODONE-acetaminophen (PERCOCET/ROXICET) 5-325 MG tablet Take 1 tablet by mouth every 6 (six) hours as needed for severe pain. 10 tablet 0  . potassium chloride SA (K-DUR,KLOR-CON) 20 MEQ tablet Take 10 mEq by mouth daily.  0  . pravastatin (PRAVACHOL) 40 MG tablet TAKE 1 BY MOUTH DAILY AT BEDTIME FOR CHOLESTEROL (Patient taking differently: Take 40 mg by mouth at bedtime. ) 90 tablet 1  . promethazine-dextromethorphan (PROMETHAZINE-DM) 6.25-15 MG/5ML syrup Take 5 mL PO q8hrs prn for severe coughing. 360 mL 1   No current facility-administered medications on file prior to visit.    ROS: all negative except above.   Physical Exam: There were no vitals filed for this visit. BP 132/76 mmHg  Pulse 60  Temp(Src) 97.7 F (36.5 C)  Resp 16 General Appearance: Well nourished, in no apparent distress. Eyes: PERRLA, EOMs, conjunctiva no swelling or erythema Sinuses: No Frontal/maxillary tenderness ENT/Mouth: Ext aud canals clear, TMs without erythema, bulging. No erythema, swelling, or exudate on post pharynx.  Tonsils not swollen or erythematous. Hearing normal.  Neck: Supple, thyroid normal.  Respiratory: Respiratory effort normal, BS equal bilaterally without rales, rhonchi, wheezing or stridor.  Cardio: RRR with no MRGs. Brisk peripheral pulses  without edema.  Abdomen: Soft, + BS,  Diffuse tenderness, no guarding, rebound, hernias, masses. Lymphatics: Non tender without lymphadenopathy.  Musculoskeletal: + right SI tenderness, right greater trochanteric tenderness, + straight leg raise on the right, with decrease reflexes on the right, normal strength, normal sensory exam, right hip with some mild anterior hip pain to palpation but full ROM right hip without pain. Pain with change in position.  Skin: Warm, dry without rashes, lesions, ecchymosis.  Neuro: Cranial nerves intact. Normal muscle tone, no cerebellar symptoms. Sensation intact.  Psych: Awake and oriented X 3, normal affect, Insight and Judgment appropriate.     Quentin Mulling, PA-C 9:20 AM [facility]

## 2015-09-23 NOTE — Patient Instructions (Signed)
Can take lyrica samples, can start on prednisone, only take percocet as needed Can take stool softner and movantik samples for constipation.   Can take the lyrica samples for nerve pain. It can make you sleepy so we suggest trying it at night first and please plan to not drive or do anything strenuous. Also please do not take this medication with alcohol.  Start out 1 pill at night before bed, can increase to 2 pills at night before bed. Please call the office if you have any side effects.   Can take 3 pills a day however you would like  Some examples: - 1 breakfast, lunch, bedtime. - 1 at breakfast, 2 at bed time  How should I use this medicine? Take this medicine by mouth with a glass of water. Follow the directions on the prescription label. You can take this medicine with or without food. Take your doses at regular intervals. Do not take your medicine more often than directed. Do not stop taking except on your doctor's advice.  What if I miss a dose? If you miss a dose, take it as soon as you can. If it is almost time for your next dose, take only that dose. Do not take double or extra doses.  What should I watch for while using this medicine? Tell your doctor or healthcare professional if your symptoms do not start to get better or if they get worse.   You may get drowsy or dizzy. Do not drive, use machinery, or do anything that needs mental alertness until you know how this medicine affects you. Do not stand or sit up quickly, especially if you are an older patient. This reduces the risk of dizzy or fainting spells. Alcohol may interfere with the effect of this medicine. Avoid alcoholic drinks. If you have a heart condition, like congestive heart failure, and notice that you are retaining water and have swelling in your hands or feet, contact your health care provider immediately.  What side effects may I notice from receiving this medicine? Side effects that you should report to your  doctor or health care professional as soon as possible and are very rare: -allergic reactions like skin rash, itching or hives, swelling of the face, lips, or tongue -breathing problems -changes in vision -jerking or unusual movements of any part of your body -suicidal thoughts or other mood changes -swelling of the ankles, feet, hands -unusual bruising or bleeding  Side effects that usually do not require medical attention (Report these to your doctor or health care professional if they continue or are bothersome.): -dizziness -drowsiness -dry mouth -nausea -tremors    Back Pain, Adult Back pain is very common in adults.The cause of back pain is rarely dangerous and the pain often gets better over time.The cause of your back pain may not be known. Some common causes of back pain include:  Strain of the muscles or ligaments supporting the spine.  Wear and tear (degeneration) of the spinal disks.  Arthritis.  Direct injury to the back. For many people, back pain may return. Since back pain is rarely dangerous, most people can learn to manage this condition on their own. HOME CARE INSTRUCTIONS Watch your back pain for any changes. The following actions may help to lessen any discomfort you are feeling:  Remain active. It is stressful on your back to sit or stand in one place for long periods of time. Do not sit, drive, or stand in one place for more than  30 minutes at a time. Take short walks on even surfaces as soon as you are able.Try to increase the length of time you walk each day.  Exercise regularly as directed by your health care provider. Exercise helps your back heal faster. It also helps avoid future injury by keeping your muscles strong and flexible.  Do not stay in bed.Resting more than 1-2 days can delay your recovery.  Pay attention to your body when you bend and lift. The most comfortable positions are those that put less stress on your recovering back. Always  use proper lifting techniques, including:  Bending your knees.  Keeping the load close to your body.  Avoiding twisting.  Find a comfortable position to sleep. Use a firm mattress and lie on your side with your knees slightly bent. If you lie on your back, put a pillow under your knees.  Avoid feeling anxious or stressed.Stress increases muscle tension and can worsen back pain.It is important to recognize when you are anxious or stressed and learn ways to manage it, such as with exercise.  Take medicines only as directed by your health care provider. Over-the-counter medicines to reduce pain and inflammation are often the most helpful.Your health care provider may prescribe muscle relaxant drugs.These medicines help dull your pain so you can more quickly return to your normal activities and healthy exercise.  Apply ice to the injured area:  Put ice in a plastic bag.  Place a towel between your skin and the bag.  Leave the ice on for 20 minutes, 2-3 times a day for the first 2-3 days. After that, ice and heat may be alternated to reduce pain and spasms.  Maintain a healthy weight. Excess weight puts extra stress on your back and makes it difficult to maintain good posture. SEEK MEDICAL CARE IF:  You have pain that is not relieved with rest or medicine.  You have increasing pain going down into the legs or buttocks.  You have pain that does not improve in one week.  You have night pain.  You lose weight.  You have a fever or chills. SEEK IMMEDIATE MEDICAL CARE IF:   You develop new bowel or bladder control problems.  You have unusual weakness or numbness in your arms or legs.  You develop nausea or vomiting.  You develop abdominal pain.  You feel faint.   This information is not intended to replace advice given to you by your health care provider. Make sure you discuss any questions you have with your health care provider.   Document Released: 08/16/2005 Document  Revised: 09/06/2014 Document Reviewed: 12/18/2013 Elsevier Interactive Patient Education [employer]

## 2015-10-01 ENCOUNTER — Other Ambulatory Visit: Payer: Self-pay | Admitting: Internal Medicine

## 2015-10-03 ENCOUNTER — Other Ambulatory Visit: Payer: Self-pay | Admitting: Orthopedic Surgery

## 2015-10-03 DIAGNOSIS — M545 Low back pain: Principal | ICD-10-CM

## 2015-10-03 DIAGNOSIS — G8929 Other chronic pain: Secondary | ICD-10-CM

## 2015-10-07 ENCOUNTER — Ambulatory Visit
Admission: RE | Admit: 2015-10-07 | Discharge: 2015-10-07 | Disposition: A | Discharge status: Home / Self Care | Payer: Medicare Other | Source: Ambulatory Visit | Attending: Orthopedic Surgery | Admitting: Orthopedic Surgery

## 2015-10-07 ENCOUNTER — Encounter: Payer: Self-pay | Admitting: Radiology

## 2015-10-07 ENCOUNTER — Other Ambulatory Visit: Payer: Self-pay | Admitting: Internal Medicine

## 2015-10-07 DIAGNOSIS — G8929 Other chronic pain: Secondary | ICD-10-CM

## 2015-10-07 DIAGNOSIS — M545 Low back pain, unspecified: Secondary | ICD-10-CM

## 2015-10-07 DIAGNOSIS — M4806 Spinal stenosis, lumbar region: Secondary | ICD-10-CM | POA: Diagnosis not present

## 2015-10-07 MED ORDER — IOHEXOL 180 MG/ML  SOLN
15.0000 mL | Freq: Once | INTRAMUSCULAR | Status: AC | PRN
  Administered 2015-10-07: 15 mL via INTRATHECAL

## 2015-10-07 MED ORDER — AMLODIPINE BESYLATE 10 MG PO TABS: 10.0000 mg | ORAL_TABLET | Freq: Every day | ORAL | Status: DC

## 2015-10-07 MED ORDER — ONDANSETRON HCL 4 MG/2ML IJ SOLN
4.0000 mg | Freq: Once | INTRAMUSCULAR | Status: AC
  Administered 2015-10-07: 4 mg via INTRAMUSCULAR

## 2015-10-07 MED ORDER — MEPERIDINE HCL 100 MG/ML IJ SOLN
75.0000 mg | Freq: Once | INTRAMUSCULAR | Status: AC
  Administered 2015-10-07: 75 mg via INTRAMUSCULAR

## 2015-10-07 MED ORDER — DIAZEPAM 5 MG PO TABS
5.0000 mg | ORAL_TABLET | Freq: Once | ORAL | Status: AC
  Administered 2015-10-07: 5 mg via ORAL

## 2015-10-07 NOTE — Discharge Instructions (Signed)
Myelogram Discharge Instructions  1. Go home and rest quietly for the next 24 hours.  It is important to lie flat for the next 24 hours.  Get up only to go to the restroom.  You may lie in the bed or on a couch on your back, your stomach, your left side or your right side.  You may have one pillow under your head.  You may have pillows between your knees while you are on your side or under your knees while you are on your back.  2. DO NOT drive today.  Recline the seat as far back as it will go, while still wearing your seat belt, on the way home.  3. You may get up to go to the bathroom as needed.  You may sit up for 10 minutes to eat.  You may resume your normal diet and medications unless otherwise indicated.  Drink lots of extra fluids today and tomorrow.  4. The incidence of headache, nausea, or vomiting is about 5% (one in 20 patients).  If you develop a headache, lie flat and drink plenty of fluids until the headache goes away.  Caffeinated beverages may be helpful.  If you develop severe nausea and vomiting or a headache that does not go away with flat bed rest, call [phone].  5. You may resume normal activities after your 24 hours of bed rest is over; however, do not exert yourself strongly or do any heavy lifting tomorrow. If when you get up you have a headache when standing, go back to bed and force fluids for another 24 hours.  6. Call your physician for a follow-up appointment.  The results of your myelogram will be sent directly to your physician by the following day.  7. If you have any questions or if complications develop after you arrive home, please call [phone].  Discharge instructions have been explained to the patient.  The patient, or the person responsible for the patient, fully understands these instructions.       May resume Promethazine on Feb. 8, 2017, after 1:00 pm.

## 2015-10-07 NOTE — Progress Notes (Signed)
Patient states she has not taken any promethazine-containing products in the past two days.  Donell Sievert, RN

## 2015-10-08 DIAGNOSIS — M4806 Spinal stenosis, lumbar region: Secondary | ICD-10-CM | POA: Diagnosis not present

## 2015-10-08 DIAGNOSIS — M7061 Trochanteric bursitis, right hip: Secondary | ICD-10-CM | POA: Diagnosis not present

## 2015-10-08 DIAGNOSIS — M545 Low back pain: Secondary | ICD-10-CM | POA: Diagnosis not present

## 2015-10-15 DIAGNOSIS — M4806 Spinal stenosis, lumbar region: Secondary | ICD-10-CM | POA: Diagnosis not present

## 2015-10-15 DIAGNOSIS — M25551 Pain in right hip: Secondary | ICD-10-CM | POA: Diagnosis not present

## 2015-10-15 DIAGNOSIS — M5416 Radiculopathy, lumbar region: Secondary | ICD-10-CM | POA: Diagnosis not present

## 2015-10-30 ENCOUNTER — Encounter: Payer: Self-pay | Admitting: Podiatry

## 2015-10-30 ENCOUNTER — Ambulatory Visit (INDEPENDENT_AMBULATORY_CARE_PROVIDER_SITE_OTHER): Payer: Medicare Other | Admitting: Podiatry

## 2015-10-30 DIAGNOSIS — B351 Tinea unguium: Secondary | ICD-10-CM

## 2015-10-30 DIAGNOSIS — M79676 Pain in unspecified toe(s): Secondary | ICD-10-CM

## 2015-10-30 NOTE — Progress Notes (Signed)
Patient ID: Tracey Todd, female   DOB: 01/05/1935, 80 y.o.   MRN: [id number] Complaint:  Visit Type: Patient returns to my office for continued preventative foot care services. Complaint: Patient states" my nails have grown long and thick and become painful to walk and wear shoes" Patient has been diagnosed with DM with neuropathy.. The patient presents for preventative foot care services. No changes to ROS  Podiatric Exam: Vascular: dorsalis pedis and posterior tibial pulses are palpable bilateral. Capillary return is immediate. Temperature gradient is WNL. Skin turgor WNL  Sensorium: Normal Semmes Weinstein monofilament test. Normal tactile sensation bilaterally. Nail Exam: Pt has thick disfigured discolored nails with subungual debris noted bilateral entire nail hallux through fifth toenails Ulcer Exam: There is no evidence of ulcer or pre-ulcerative changes or infection. Orthopedic Exam: Muscle tone and strength are WNL. No limitations in general ROM. No crepitus or effusions noted. Foot type and digits show no abnormalities. Bony prominences are unremarkable. Skin: No Porokeratosis. No infection or ulcers  Diagnosis:  Onychomycosis, , Pain in right toe, pain in left toes  Treatment & Plan Procedures and Treatment: Consent by patient was obtained for treatment procedures. The patient understood the discussion of treatment and procedures well. All questions were answered thoroughly reviewed. Debridement of mycotic and hypertrophic toenails, 1 through 5 bilateral and clearing of subungual debris. No ulceration, no infection noted. Minimal mechanical debridement requested. Return Visit-Office Procedure: Patient instructed to return to the office for a follow up visit 3 months for continued evaluation and treatment.    Anureet Bruington DPM 

## 2015-11-10 ENCOUNTER — Ambulatory Visit (INDEPENDENT_AMBULATORY_CARE_PROVIDER_SITE_OTHER): Payer: Medicare Other | Admitting: *Deleted

## 2015-11-10 DIAGNOSIS — I495 Sick sinus syndrome: Secondary | ICD-10-CM

## 2015-11-10 NOTE — Progress Notes (Signed)
Remote pacemaker transmission.   

## 2015-11-12 ENCOUNTER — Ambulatory Visit: Payer: Self-pay | Admitting: Physician Assistant

## 2015-11-12 DIAGNOSIS — M5416 Radiculopathy, lumbar region: Secondary | ICD-10-CM | POA: Diagnosis not present

## 2015-11-20 ENCOUNTER — Ambulatory Visit (INDEPENDENT_AMBULATORY_CARE_PROVIDER_SITE_OTHER): Payer: Medicare Other | Admitting: Physician Assistant

## 2015-11-20 ENCOUNTER — Encounter: Payer: Self-pay | Admitting: Physician Assistant

## 2015-11-20 VITALS — BP 130/72 | HR 90 | Temp 97.5°F | Resp 16 | Ht 58.5 in | Wt 173.8 lb

## 2015-11-20 DIAGNOSIS — E782 Mixed hyperlipidemia: Secondary | ICD-10-CM

## 2015-11-20 DIAGNOSIS — K760 Fatty (change of) liver, not elsewhere classified: Secondary | ICD-10-CM

## 2015-11-20 DIAGNOSIS — I272 Other secondary pulmonary hypertension: Secondary | ICD-10-CM

## 2015-11-20 DIAGNOSIS — E1129 Type 2 diabetes mellitus with other diabetic kidney complication: Secondary | ICD-10-CM | POA: Diagnosis not present

## 2015-11-20 DIAGNOSIS — I480 Paroxysmal atrial fibrillation: Secondary | ICD-10-CM

## 2015-11-20 DIAGNOSIS — E669 Obesity, unspecified: Secondary | ICD-10-CM | POA: Diagnosis not present

## 2015-11-20 DIAGNOSIS — I10 Essential (primary) hypertension: Secondary | ICD-10-CM | POA: Diagnosis not present

## 2015-11-20 DIAGNOSIS — E1121 Type 2 diabetes mellitus with diabetic nephropathy: Secondary | ICD-10-CM

## 2015-11-20 DIAGNOSIS — R350 Frequency of micturition: Secondary | ICD-10-CM | POA: Diagnosis not present

## 2015-11-20 DIAGNOSIS — Z79899 Other long term (current) drug therapy: Secondary | ICD-10-CM | POA: Diagnosis not present

## 2015-11-20 DIAGNOSIS — E559 Vitamin D deficiency, unspecified: Secondary | ICD-10-CM | POA: Diagnosis not present

## 2015-11-20 LAB — CBC WITH DIFFERENTIAL/PLATELET
BASOS PCT: 1 % (ref 0–1)
Basophils Absolute: 0.1 10*3/uL (ref 0.0–0.1)
EOS PCT: 2 % (ref 0–5)
Eosinophils Absolute: 0.2 10*3/uL (ref 0.0–0.7)
HCT: 36.2 % (ref 36.0–46.0)
Hemoglobin: 12.2 g/dL (ref 12.0–15.0)
Lymphocytes Relative: 34 % (ref 12–46)
Lymphs Abs: 2.6 10*3/uL (ref 0.7–4.0)
MCH: 29 pg (ref 26.0–34.0)
MCHC: 33.7 g/dL (ref 30.0–36.0)
MCV: 86.2 fL (ref 78.0–100.0)
MONO ABS: 0.7 10*3/uL (ref 0.1–1.0)
MPV: 9.4 fL (ref 8.6–12.4)
Monocytes Relative: 9 % (ref 3–12)
Neutro Abs: 4.2 10*3/uL (ref 1.7–7.7)
Neutrophils Relative %: 54 % (ref 43–77)
PLATELETS: 272 10*3/uL (ref 150–400)
RBC: 4.2 MIL/uL (ref 3.87–5.11)
RDW: 16.1 % — AB (ref 11.5–15.5)
WBC: 7.7 10*3/uL (ref 4.0–10.5)

## 2015-11-20 NOTE — Progress Notes (Signed)
Patient ID: Tracey Todd, female   DOB: 12/30/1934, 81 y.o.   MRN: [id number]  Assessment and Plan:  Hypertension:  -Continue medication -monitor blood pressure at home. -Continue DASH diet -Reminder to go to the ER if any CP, SOB, nausea, dizziness, severe HA, changes vision/speech, left arm numbness and tingling and jaw pain.  Cholesterol - Continue diet and exercise -Check cholesterol.   Diabetes with diabetic chronic kidney disease -Continue diet and exercise.  -Check A1C - check urine, make sure no infection with elevated sugars but explained to patient likely from prednisone, continue MF  Vitamin D Def -check level -continue medications.   Obesity with co morbidities - long discussion about weight loss, diet, and exercise   Continue diet and meds as discussed. Further disposition pending results of labs. Discussed med's effects and SE's.    Future Appointments Date Time Provider Department Center  01/29/2016 8:15 AM Helane GuntherGregory Mayer, DPM [facility] None  02/25/2016 11:15 AM Lucky CowboyWilliam McKeown, MD [facility] None     HPI 81 y.o. female  presents for 3 month follow up with hypertension, hyperlipidemia, diabetes and vitamin D deficiency.  She had severe DDD, is following with Dr. Regino SchultzeWang, states she is on gabapentin and she is walking better and has not had many pains.    Her blood pressure has been controlled at home, today their BP is BP: 130/72 mmHg.She does not workout. She denies chest pain, shortness of breath, dizziness.  She does check her BP at home.  She reports that she has been working in her garden and going fishing.  She also rides her stationary bike 3x weekly at home for 30 minutes daily.   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 Johney FrameAllred. She has moderate PHTN and normal EF 55% 09/2014.    She is on cholesterol medication and denies myalgias. Her cholesterol is at goal. The cholesterol was:  08/06/2015: Cholesterol 157; HDL 63; LDL  Cholesterol 81; Triglycerides 67   She has been working on diet and exercise for diabetes with diabetic chronic kidney disease, she is on bASA, she is on ACE/ARB, and denies  foot ulcerations, hyperglycemia, hypoglycemia , increased appetite, nausea, paresthesia of the feet, polydipsia, polyuria, visual disturbances, vomiting and weight loss. Last A1C was: 08/06/2015: Hgb A1c MFr Bld 6.4*.  She was on prednisone and states that her sugar has been in 130's.    Patient is on Vitamin D supplement. 08/06/2015: Vit D, 25-Hydroxy 80   BMI is Body mass index is 35.7 kg/(m^2)., she is working on diet and exercise. Wt Readings from Last 3 Encounters:  11/20/15 173 lb 12.8 oz (78.835 kg)  09/11/15 172 lb (78.019 kg)  08/06/15 168 lb 12.8 oz (76.567 kg)     Current Medications:  Current Outpatient Prescriptions on File Prior to Visit  Medication Sig Dispense Refill  . Acetaminophen (ARTHRITIS PAIN RELIEVER PO) Take 2 capsules by mouth daily as needed (arthritis).     Marland Kitchen. amLODipine (NORVASC) 10 MG tablet Take 1 tablet (10 mg total) by mouth daily. 30 tablet 3  . aspirin 81 MG tablet Take 81 mg by mouth daily.    . calcium carbonate (OS-CAL) 600 MG TABS tablet Take 600 mg by mouth daily with breakfast.    . Cetirizine HCl (ZYRTEC ALLERGY PO) Take 1 tablet by mouth daily.    . Cholecalciferol (VITAMIN D) 2000 UNITS CAPS Take 6,000 Units by mouth daily.     Marland Kitchen. FREESTYLE LITE test  strip USE ONE STRIP TO CHECK GLUCOSE ONCE DAILY 100 each 99  . HYDROcodone-acetaminophen (NORCO) 5-325 MG tablet Take 1 pill up to 4 times a day for severe pain 30 tablet 0  . Lancets (FREESTYLE) lancets USE ONE LANCET TO CHECK GLUCOSE EVERY DAY 100 each 99  . Magnesium 500 MG TABS Take 500 mg by mouth 3 (three) times daily.     . metFORMIN (GLUCOPHAGE-XR) 500 MG 24 hr tablet Take 1,000 mg by mouth 2 (two) times daily.  0  . naloxegol oxalate (MOVANTIK) 25 MG TABS tablet Take 1 tablet (25 mg total) by mouth daily. 10 tablet 0  .  naproxen sodium (ANAPROX) 220 MG tablet Take 440 mg by mouth daily as needed (pain).    . Omeprazole 20 MG TBEC Take 1 tablet (20 mg total) by mouth daily. (Patient taking differently: Take 20 mg by mouth 2 (two) times daily. ) 90 each 3  . oxyCODONE-acetaminophen (PERCOCET/ROXICET) 5-325 MG tablet Take 1 tablet by mouth every 6 (six) hours as needed for severe pain. 10 tablet 0  . potassium chloride SA (K-DUR,KLOR-CON) 20 MEQ tablet TAKE ONE TABLET BY MOUTH ONCE DAILY 90 tablet 0  . pravastatin (PRAVACHOL) 40 MG tablet TAKE 1 BY MOUTH DAILY AT BEDTIME FOR CHOLESTEROL (Patient taking differently: Take 40 mg by mouth at bedtime. ) 90 tablet 1   No current facility-administered medications on file prior to visit.   Medical History:  Past Medical History  Diagnosis Date  . Vitamin D deficiency   . Nodule of chest wall   . Heel pain     left foot  . Hypercholesteremia   . HTN (hypertension)   . Type II or unspecified type diabetes mellitus without mention of complication, not stated as uncontrolled   . DJD (degenerative joint disease)   . Fatty liver   . Normal coronary arteries 2/10/03/14  . Acute CHF (HCC) 10/21/14    secondary to bradycardia   Allergies:  Allergies  Allergen Reactions  . Penicillins Anaphylaxis    Has patient had a PCN reaction causing immediate rash, facial/tongue/throat swelling, SOB or lightheadedness with hypotension: Yes Has patient had a PCN reaction causing severe rash involving mucus membranes or skin necrosis: No Has patient had a PCN reaction that required hospitalization No Has patient had a PCN reaction occurring within the last 10 years: No If all of the above answers are "NO", then may proceed with Cephalosporin use.   Gunnar Bulla [Piroxicam] Other (See Comments)    Bleeding   . Calcium-Containing Compounds Other (See Comments)    "calcium deposits on skin"  . Celebrex [Celecoxib] Itching  . Daypro [Oxaprozin] Itching     Review of Systems:  Review  of Systems  Constitutional: Negative for fever, chills and malaise/fatigue.  HENT: Negative for congestion, nosebleeds and sore throat.   Eyes: Negative.   Respiratory: Negative for cough, shortness of breath and wheezing.   Cardiovascular: Negative for chest pain, palpitations and leg swelling.  Gastrointestinal: Positive for heartburn and diarrhea. Negative for constipation, blood in stool and melena.  Genitourinary: Negative.   Skin: Negative.   Neurological: Negative for dizziness, sensory change, loss of consciousness and headaches.  Psychiatric/Behavioral: Negative for depression. The patient is not nervous/anxious and does not have insomnia.     Family history- Review and unchanged  Social history- Review and unchanged  Physical Exam: BP 130/72 mmHg  Pulse 90  Temp(Src) 97.5 F (36.4 C) (Temporal)  Resp 16  Ht 4' 10.5" (  1.486 m)  Wt 173 lb 12.8 oz (78.835 kg)  BMI 35.70 kg/m2  SpO2 96% Wt Readings from Last 3 Encounters:  11/20/15 173 lb 12.8 oz (78.835 kg)  09/11/15 172 lb (78.019 kg)  08/06/15 168 lb 12.8 oz (76.567 kg)   General Appearance: Well nourished well developed, non-toxic appearing, in no apparent distress. Eyes: PERRLA, EOMs, conjunctiva no swelling or erythema ENT/Mouth: Ear canals clear with no erythema, swelling, or discharge.  TMs normal bilaterally, oropharynx clear, moist, with no exudate.   Neck: Supple, thyroid normal, no JVD, no cervical adenopathy.  Respiratory: Respiratory effort normal, breath sounds clear A&P, no wheeze, rhonchi or rales noted.  No retractions, no accessory muscle usage Cardio: RRR with no MRGs. No noted edema.  Abdomen: Soft, + BS.  Non tender, no guarding, rebound, hernias, masses. Musculoskeletal: Full ROM, 5/5 strength, Normal gait Skin: Warm, dry without rashes, lesions, ecchymosis.  Neuro: Awake and oriented X 3, Cranial nerves intact. No cerebellar symptoms.  Psych: normal affect, Insight and Judgment appropriate.     Quentin Mulling, PA-C 2:56 PM [facility]

## 2015-11-20 NOTE — Patient Instructions (Signed)
    Bad carbs also include fruit juice, alcohol, and sweet tea. These are empty calories that do not signal to your brain that you are full.   Please remember the good carbs are still carbs which convert into sugar. So please measure them out no more than 1/2-1 cup of rice, oatmeal, pasta, and beans  Veggies are however free foods! Pile them on.   Not all fruit is created equal. Please see the list below, the fruit at the bottom is higher in sugars than the fruit at the top. Please avoid all dried fruits.      Your A1C is a measure of your sugar over the past 3 months and is not affected by what you have eaten over the past few days. Diabetes increases your chances of stroke and heart attack over 300 % and is the leading cause of blindness and kidney failure in the [place]. Please make sure you decrease bad carbs like white bread, white rice, potatoes, corn, soft drinks, pasta, cereals, refined sugars, sweet tea, dried fruits, and fruit juice. Good carbs are okay to eat in moderation like sweet potatoes, brown rice, whole grain pasta/bread, most fruit (except dried fruit) and you can eat as many veggies as you want.   Greater than 6.5 is considered diabetic. Between 6.4 and 5.7 is prediabetic If your A1C is less than 5.7 you are NOT diabetic.  Targets for Glucose Readings: Time of Check Target for patients WITHOUT Diabetes Target for DIABETICS  Before Meals Less than 100  less than 150  Two hours after meals Less than 200  Less than 250    Recommendations For Diabetic/Prediabetic Patients:   -  Take medications as prescribed  -  Recommend Dr Francis DowseJoel Fuhrman's book "The End of Diabetes "  And "The End of Dieting"- Can get at  www.[employer].com and encourage also get the Audio CD book  - AVOID Animal products, ie. Meat - red/white, Poultry and Dairy/especially cheese - Exercise at least 5 times a week for 30 minutes or preferably daily.  - No Smoking - Drink less than 2 drinks a day.  -  Monitor your feet for sores - Have yearly Eye Exams - Recommend annual Flu vaccine  - Recommend Pneumovax and Prevnar vaccines - Shingles Vaccine (Zostavax) if over 79 y.o.  Goals:   - BMI less than 24 - Fasting sugar less than 130 or less than 150 if tapering medicines to lose weight  - Systolic BP less than 130  - Diastolic BP less than 80 - Bad LDL Cholesterol less than 70 - Triglycerides less than 150

## 2015-11-21 LAB — URINALYSIS, ROUTINE W REFLEX MICROSCOPIC
BILIRUBIN URINE: NEGATIVE
GLUCOSE, UA: NEGATIVE
HGB URINE DIPSTICK: NEGATIVE
KETONES UR: NEGATIVE
Nitrite: NEGATIVE
PROTEIN: NEGATIVE
Specific Gravity, Urine: 1.012 (ref 1.001–1.035)
pH: 7 (ref 5.0–8.0)

## 2015-11-21 LAB — HEPATIC FUNCTION PANEL
ALBUMIN: 4 g/dL (ref 3.6–5.1)
ALK PHOS: 42 U/L (ref 33–130)
ALT: 7 U/L (ref 6–29)
AST: 15 U/L (ref 10–35)
Bilirubin, Direct: 0.1 mg/dL (ref <=0.2)
Indirect Bilirubin: 0.3 mg/dL (ref 0.2–1.2)
TOTAL PROTEIN: 6.4 g/dL (ref 6.1–8.1)
Total Bilirubin: 0.4 mg/dL (ref 0.2–1.2)

## 2015-11-21 LAB — LIPID PANEL
Cholesterol: 151 mg/dL (ref 125–200)
HDL: 67 mg/dL (ref >=46)
LDL CALC: 67 mg/dL (ref <130)
TRIGLYCERIDES: 83 mg/dL (ref <150)
Total CHOL/HDL Ratio: 2.3 Ratio (ref <=5.0)
VLDL: 17 mg/dL (ref <30)

## 2015-11-21 LAB — BASIC METABOLIC PANEL WITH GFR
BUN: 15 mg/dL (ref 7–25)
CALCIUM: 9.5 mg/dL (ref 8.6–10.4)
CO2: 27 mmol/L (ref 20–31)
Chloride: 99 mmol/L (ref 98–110)
Creat: 0.7 mg/dL (ref 0.60–0.93)
GFR, EST NON AFRICAN AMERICAN: 83 mL/min (ref >=60)
GLUCOSE: 101 mg/dL — AB (ref 65–99)
Potassium: 4.1 mmol/L (ref 3.5–5.3)
SODIUM: 139 mmol/L (ref 135–146)

## 2015-11-21 LAB — URINALYSIS, MICROSCOPIC ONLY
BACTERIA UA: NONE SEEN [HPF]
Casts: NONE SEEN [LPF]
Crystals: NONE SEEN [HPF]
RBC / HPF: NONE SEEN RBC/HPF (ref <=2)
Squamous Epithelial / LPF: NONE SEEN [HPF] (ref <=5)
YEAST: NONE SEEN [HPF]

## 2015-11-21 LAB — TSH: TSH: 1.76 mIU/L

## 2015-11-21 LAB — HEMOGLOBIN A1C
Hgb A1c MFr Bld: 7.3 % — ABNORMAL HIGH (ref <5.7)
MEAN PLASMA GLUCOSE: 163 mg/dL — AB (ref <117)

## 2015-11-21 LAB — MAGNESIUM: MAGNESIUM: 1.8 mg/dL (ref 1.5–2.5)

## 2015-11-22 LAB — URINE CULTURE
COLONY COUNT: NO GROWTH
Organism ID, Bacteria: NO GROWTH

## 2015-11-26 ENCOUNTER — Other Ambulatory Visit: Payer: Self-pay | Admitting: Internal Medicine

## 2015-12-04 LAB — CUP PACEART REMOTE DEVICE CHECK
Battery Remaining Percentage: 95.5 %
Battery Voltage: 3.01 V
Brady Statistic AP VS Percent: 59 %
Brady Statistic AS VP Percent: 1 %
Brady Statistic AS VS Percent: 38 %
Implantable Lead Implant Date: 20160226
Implantable Lead Location: 753860
Lead Channel Pacing Threshold Amplitude: 0.875 V
Lead Channel Pacing Threshold Amplitude: 1 V
Lead Channel Pacing Threshold Pulse Width: 0.4 ms
Lead Channel Sensing Intrinsic Amplitude: 4.5 mV
Lead Channel Setting Pacing Amplitude: 1.25 V
Lead Channel Setting Pacing Pulse Width: 0.4 ms
MDC IDC LEAD IMPLANT DT: 20160226
MDC IDC LEAD LOCATION: 753859
MDC IDC LEAD MODEL: 1948
MDC IDC MSMT BATTERY REMAINING LONGEVITY: 123 mo
MDC IDC MSMT LEADCHNL RA IMPEDANCE VALUE: 510 Ohm
MDC IDC MSMT LEADCHNL RA PACING THRESHOLD PULSEWIDTH: 0.4 ms
MDC IDC MSMT LEADCHNL RV IMPEDANCE VALUE: 560 Ohm
MDC IDC MSMT LEADCHNL RV SENSING INTR AMPL: 10.6 mV
MDC IDC PG SERIAL: 7700661
MDC IDC SESS DTM: 20170313060015
MDC IDC SET LEADCHNL RA PACING AMPLITUDE: 1.875
MDC IDC SET LEADCHNL RV SENSING SENSITIVITY: 2 mV
MDC IDC STAT BRADY AP VP PERCENT: 1.7 %
MDC IDC STAT BRADY RA PERCENT PACED: 60 %
MDC IDC STAT BRADY RV PERCENT PACED: 1.7 %

## 2015-12-08 DIAGNOSIS — E119 Type 2 diabetes mellitus without complications: Secondary | ICD-10-CM | POA: Diagnosis not present

## 2015-12-08 DIAGNOSIS — E1129 Type 2 diabetes mellitus with other diabetic kidney complication: Secondary | ICD-10-CM | POA: Diagnosis not present

## 2015-12-09 ENCOUNTER — Encounter: Payer: Self-pay | Admitting: Cardiology

## 2015-12-24 DIAGNOSIS — M85851 Other specified disorders of bone density and structure, right thigh: Secondary | ICD-10-CM | POA: Diagnosis not present

## 2015-12-24 DIAGNOSIS — M85852 Other specified disorders of bone density and structure, left thigh: Secondary | ICD-10-CM | POA: Diagnosis not present

## 2015-12-30 ENCOUNTER — Other Ambulatory Visit: Payer: Self-pay | Admitting: Nurse Practitioner

## 2016-01-12 DIAGNOSIS — H40023 Open angle with borderline findings, high risk, bilateral: Secondary | ICD-10-CM | POA: Diagnosis not present

## 2016-01-18 ENCOUNTER — Encounter: Payer: Self-pay | Admitting: *Deleted

## 2016-01-20 ENCOUNTER — Encounter: Payer: Self-pay | Admitting: Internal Medicine

## 2016-01-27 DIAGNOSIS — E119 Type 2 diabetes mellitus without complications: Secondary | ICD-10-CM | POA: Diagnosis not present

## 2016-01-29 ENCOUNTER — Ambulatory Visit (INDEPENDENT_AMBULATORY_CARE_PROVIDER_SITE_OTHER): Payer: Medicare Other | Admitting: Podiatry

## 2016-01-29 ENCOUNTER — Encounter: Payer: Self-pay | Admitting: Podiatry

## 2016-01-29 DIAGNOSIS — B351 Tinea unguium: Secondary | ICD-10-CM

## 2016-01-29 DIAGNOSIS — M79676 Pain in unspecified toe(s): Secondary | ICD-10-CM | POA: Diagnosis not present

## 2016-01-29 NOTE — Progress Notes (Signed)
Patient ID: Tracey Todd, female   DOB: 02/22/1935, 80 y.o.   MRN: [id number] Complaint:  Visit Type: Patient returns to my office for continued preventative foot care services. Complaint: Patient states" my nails have grown long and thick and become painful to walk and wear shoes" Patient has been diagnosed with DM with neuropathy.. The patient presents for preventative foot care services. No changes to ROS  Podiatric Exam: Vascular: dorsalis pedis and posterior tibial pulses are palpable bilateral. Capillary return is immediate. Temperature gradient is WNL. Skin turgor WNL  Sensorium: Normal Semmes Weinstein monofilament test. Normal tactile sensation bilaterally. Nail Exam: Pt has thick disfigured discolored nails with subungual debris noted bilateral entire nail hallux through fifth toenails Ulcer Exam: There is no evidence of ulcer or pre-ulcerative changes or infection. Orthopedic Exam: Muscle tone and strength are WNL. No limitations in general ROM. No crepitus or effusions noted. Foot type and digits show no abnormalities. Bony prominences are unremarkable. Skin: No Porokeratosis. No infection or ulcers  Diagnosis:  Onychomycosis, , Pain in right toe, pain in left toes  Treatment & Plan Procedures and Treatment: Consent by patient was obtained for treatment procedures. The patient understood the discussion of treatment and procedures well. All questions were answered thoroughly reviewed. Debridement of mycotic and hypertrophic toenails, 1 through 5 bilateral and clearing of subungual debris. No ulceration, no infection noted. Minimal mechanical debridement requested. Return Visit-Office Procedure: Patient instructed to return to the office for a follow up visit 3 months for continued evaluation and treatment.    Adrianna Dudas DPM 

## 2016-02-03 ENCOUNTER — Other Ambulatory Visit: Payer: Self-pay | Admitting: Internal Medicine

## 2016-02-03 NOTE — Progress Notes (Signed)
Electrophysiology Office Note Date: 02/04/2016  ID:  GER NICKS, DOB [birth year], MRN [id number]  PCP: Tracey Corwin, MD Primary Cardiologist: Tracey Todd Electrophysiologist: Allred  CC: Pacemaker follow-up  Tracey Todd is a 81 y.o. female seen today for Dr [name].  She presents today for routine electrophysiology followup.  Since last being seen in our clinic, the patient reports doing reasonably well.  She has recently had problems with sciatica that have improved on Gabapentin. She also has had recurrent LE edema since being off HCTZ.  She denies chest pain, palpitations, dyspnea, PND, orthopnea, nausea, vomiting, dizziness, syncope, weight gain, or early satiety.  Device History: STJ dual chamber PPM implanted 2016 for SSS   Past Medical History  Diagnosis Date  . Vitamin D deficiency   . Nodule of chest wall   . Heel pain     left foot  . Hypercholesteremia   . HTN (hypertension)   . Type II or unspecified type diabetes mellitus without mention of complication, not stated as uncontrolled   . DJD (degenerative joint disease)   . Fatty liver   . Normal coronary arteries 2/10/03/14  . Acute CHF (HCC) 10/21/14    secondary to bradycardia   Past Surgical History  Procedure Laterality Date  . Tonsillectomy and adenoidectomy    . Total knee arthroplasty    . Breast surgery    . Cataract extraction    . Left and right heart catheterization with coronary angiogram N/A 10/23/2014    Procedure: LEFT AND RIGHT HEART CATHETERIZATION WITH CORONARY ANGIOGRAM;  Surgeon: Micheline Chapman, MD;  Location: [facility] CATH LAB;  Service: Cardiovascular;  Laterality: N/A;  . Permanent pacemaker insertion N/A 10/25/2014    Procedure: PERMANENT PACEMAKER INSERTION;  Surgeon: Hillis Range, MD;  Location: [facility] CATH LAB;  Service: Cardiovascular;  Laterality: N/A;    Current Outpatient Prescriptions  Medication Sig Dispense Refill  . Acetaminophen (ARTHRITIS PAIN RELIEVER PO) Take 2 capsules  by mouth daily as needed (arthritis).     Marland Kitchen amLODipine (NORVASC) 10 MG tablet TAKE ONE TABLET BY MOUTH ONCE DAILY 30 tablet 0  . aspirin 81 MG tablet Take 81 mg by mouth daily.    . calcium carbonate (OS-CAL) 600 MG TABS tablet Take 600 mg by mouth daily with breakfast.    . Cetirizine HCl (ZYRTEC ALLERGY PO) Take 1 tablet by mouth daily.    . Cholecalciferol (VITAMIN D) 2000 UNITS CAPS Take 6,000 Units by mouth daily.     . Magnesium 500 MG TABS Take 500 mg by mouth 3 (three) times daily.     . metFORMIN (GLUCOPHAGE-XR) 500 MG 24 hr tablet Take 1,000 mg by mouth 2 (two) times daily.  0  . naproxen sodium (ANAPROX) 220 MG tablet Take 440 mg by mouth daily as needed (pain).    Marland Kitchen omeprazole (PRILOSEC) 20 MG capsule   4  . Omeprazole 20 MG TBEC Take 1 tablet (20 mg total) by mouth daily. 90 each 3  . potassium chloride SA (K-DUR,KLOR-CON) 20 MEQ tablet TAKE ONE TABLET BY MOUTH ONCE DAILY 90 tablet 0  . pravastatin (PRAVACHOL) 40 MG tablet TAKE ONE TABLET BY MOUTH AT BEDTIME FOR  CHOLESTEROL 90 tablet 0   No current facility-administered medications for this visit.    Allergies:   Penicillins; Feldene; Calcium-containing compounds; Celebrex; and Daypro   Social History: Social History   Social History  . Marital Status: Married    Spouse Name: N/A  . Number of Children: N/A  .  Years of Education: N/A   Occupational History  . Not on file.   Social History Main Topics  . Smoking status: Former Smoker    Quit date: 12/06/1963  . Smokeless tobacco: Not on file  . Alcohol Use: No  . Drug Use: Not on file  . Sexual Activity: Not on file   Other Topics Concern  . Not on file   Social History Narrative    Family History: Family History  Problem Relation Age of Onset  . Diabetes Mother   . Cancer Mother   . Cancer Father      Review of Systems: All other systems reviewed and are otherwise negative except as noted above.   Physical Exam: VS:  BP 120/60 mmHg  Pulse 60   Ht 4' 10.5" (1.486 m)  Wt 172 lb 6.4 oz (78.2 kg)  BMI 35.41 kg/m2  SpO2 95% , BMI Body mass index is 35.41 kg/(m^2).  GEN- The patient is obese appearing, alert and oriented x 3 today.   HEENT: normocephalic, atraumatic; sclera clear, conjunctiva pink; hearing intact; oropharynx clear; neck supple, no JVP Lymph- no cervical lymphadenopathy Lungs- Clear to ausculation bilaterally, normal work of breathing.  No wheezes, rales, rhonchi Heart- Regular rate and rhythm, no murmurs, rubs or gallops  GI- soft, non-tender, non-distended, bowel sounds present  Extremities- no clubbing, cyanosis, +trace BLE edema; DP/PT/radial pulses 2+ bilaterally MS- no significant deformity or atrophy Skin- warm and dry, no rash or lesion; PPM pocket well healed Psych- euthymic mood, full affect Neuro- strength and sensation are intact  PPM Interrogation- reviewed in detail today,  See PACEART report  EKG:  EKG is ordered today. The ekg ordered today shows sinus rhythm, rate 63, LAFB  Recent Labs: 11/20/2015: ALT 7; BUN 15; Creat 0.70; Hemoglobin 12.2; Magnesium 1.8; Platelets 272; Potassium 4.1; Sodium 139; TSH 1.76   Wt Readings from Last 3 Encounters:  02/04/16 172 lb 6.4 oz (78.2 kg)  11/20/15 173 lb 12.8 oz (78.835 kg)  09/11/15 172 lb (78.019 kg)     Other studies Reviewed: Additional studies/ records that were reviewed today include: Dr Tracey EmmsNishan and Dr Jenel LucksAllred's office notes  Assessment and Plan:  1.  Symptomatic bradycardia Normal PPM function See Pace Art report No changes today  2.  Paroxysmal atrial fibrillation/atrial flutter Burden by device interrogation today <1% Longest episode 2 hours CHADS2VASC is 5 Discussed with patient today, will follow AF burden remotely and start OAC if burden increases.  Pt aware and agrees with plan.   3.  Obesity Body mass index is 35.41 kg/(m^2). Weight loss encouraged   4.  HTN Stable No change required today  5.  LE edema Recurrent since  stopping HCTZ Will resume today  BMET   Current medicines are reviewed at length with the patient today.   The patient does not have concerns regarding her medicines.  The following changes were made today:  Start HCTZ 25mg  daily   Labs/ tests ordered today include: BMET  Disposition:   Follow up with Merlin transmissions, Dr Tracey EmmsNishan as scheduled, me in 1 year      Signed, Gypsy Balsammber Shamika Pedregon, NP 02/04/2016 8:46 AM  [facility] [address] [place] [address] [phone] (office) [phone] (fax)

## 2016-02-04 ENCOUNTER — Ambulatory Visit (INDEPENDENT_AMBULATORY_CARE_PROVIDER_SITE_OTHER): Payer: Medicare Other | Admitting: Nurse Practitioner

## 2016-02-04 ENCOUNTER — Encounter: Payer: Self-pay | Admitting: Nurse Practitioner

## 2016-02-04 ENCOUNTER — Encounter: Payer: Self-pay | Admitting: Internal Medicine

## 2016-02-04 VITALS — BP 120/60 | HR 60 | Ht 58.5 in | Wt 172.4 lb

## 2016-02-04 DIAGNOSIS — I480 Paroxysmal atrial fibrillation: Secondary | ICD-10-CM

## 2016-02-04 DIAGNOSIS — I10 Essential (primary) hypertension: Secondary | ICD-10-CM

## 2016-02-04 DIAGNOSIS — R001 Bradycardia, unspecified: Secondary | ICD-10-CM

## 2016-02-04 LAB — BASIC METABOLIC PANEL
BUN: 17 mg/dL (ref 7–25)
CO2: 24 mmol/L (ref 20–31)
Calcium: 9.3 mg/dL (ref 8.6–10.4)
Chloride: 99 mmol/L (ref 98–110)
Creat: 0.75 mg/dL (ref 0.60–0.93)
GLUCOSE: 212 mg/dL — AB (ref 65–99)
POTASSIUM: 3.8 mmol/L (ref 3.5–5.3)
SODIUM: 137 mmol/L (ref 135–146)

## 2016-02-04 MED ORDER — HYDROCHLOROTHIAZIDE 25 MG PO TABS: 25.0000 mg | ORAL_TABLET | Freq: Every day | ORAL | Status: DC

## 2016-02-04 NOTE — Patient Instructions (Addendum)
Medication Instructions:    START TAKING HCTZ 25 MG ONCE A DAY   If you need a refill on your cardiac medications before your next appointment, please call your pharmacy.  Labwork:  BMET  TODAY   Testing/Procedures:  NONE ORDER TODAY    Follow-Up: Your physician wants you to follow-up in: ONE YEAR WITH SEILER   You will receive a reminder letter in the mail two months in advance. If you don't receive a letter, please call our office to schedule the follow-up appointment.  Remote monitoring is used to monitor your Pacemaker of ICD from home. This monitoring reduces the number of office visits required to check your device to one time per year. It allows us to keep an eye on the functioning of your device to ensure it is working properly. You are scheduled for a device check from home on . 05/05/2016..You may send your transmission at any time that day. If you have a wireless device, the transmission will be sent automatically. After your physician reviews your transmission, you will receive a postcard with your next transmission date.     Any Other Special Instructions Will Be Listed Below (If Applicable).

## 2016-02-05 ENCOUNTER — Telehealth: Payer: Self-pay | Admitting: *Deleted

## 2016-02-05 NOTE — Telephone Encounter (Signed)
-----   Message from Marily LenteAmber K Seiler, NP sent at 02/04/2016  6:09 PM EDT ----- Please notify patient of stable labs

## 2016-02-25 ENCOUNTER — Ambulatory Visit (INDEPENDENT_AMBULATORY_CARE_PROVIDER_SITE_OTHER): Payer: Self-pay | Admitting: Internal Medicine

## 2016-02-25 ENCOUNTER — Encounter: Payer: Self-pay | Admitting: Internal Medicine

## 2016-02-25 VITALS — BP 116/66 | HR 64 | Temp 97.3°F | Resp 16 | Ht <= 58 in | Wt 171.6 lb

## 2016-02-25 DIAGNOSIS — E782 Mixed hyperlipidemia: Secondary | ICD-10-CM | POA: Diagnosis not present

## 2016-02-25 DIAGNOSIS — I495 Sick sinus syndrome: Secondary | ICD-10-CM

## 2016-02-25 DIAGNOSIS — E1121 Type 2 diabetes mellitus with diabetic nephropathy: Secondary | ICD-10-CM | POA: Diagnosis not present

## 2016-02-25 DIAGNOSIS — Z136 Encounter for screening for cardiovascular disorders: Secondary | ICD-10-CM | POA: Diagnosis not present

## 2016-02-25 DIAGNOSIS — I10 Essential (primary) hypertension: Secondary | ICD-10-CM | POA: Diagnosis not present

## 2016-02-25 DIAGNOSIS — Z950 Presence of cardiac pacemaker: Secondary | ICD-10-CM | POA: Diagnosis not present

## 2016-02-25 DIAGNOSIS — E559 Vitamin D deficiency, unspecified: Secondary | ICD-10-CM

## 2016-02-25 DIAGNOSIS — Z79899 Other long term (current) drug therapy: Secondary | ICD-10-CM | POA: Diagnosis not present

## 2016-02-25 DIAGNOSIS — Z0000 Encounter for general adult medical examination without abnormal findings: Secondary | ICD-10-CM | POA: Diagnosis not present

## 2016-02-25 DIAGNOSIS — Z0001 Encounter for general adult medical examination with abnormal findings: Secondary | ICD-10-CM

## 2016-02-25 DIAGNOSIS — Z1212 Encounter for screening for malignant neoplasm of rectum: Secondary | ICD-10-CM

## 2016-02-25 DIAGNOSIS — E669 Obesity, unspecified: Secondary | ICD-10-CM

## 2016-02-25 DIAGNOSIS — R6889 Other general symptoms and signs: Secondary | ICD-10-CM | POA: Diagnosis not present

## 2016-02-25 LAB — HEPATIC FUNCTION PANEL
ALK PHOS: 56 U/L (ref 33–130)
ALT: 7 U/L (ref 6–29)
AST: 14 U/L (ref 10–35)
Albumin: 4.4 g/dL (ref 3.6–5.1)
BILIRUBIN INDIRECT: 0.4 mg/dL (ref 0.2–1.2)
Bilirubin, Direct: 0.1 mg/dL (ref <=0.2)
TOTAL PROTEIN: 6.8 g/dL (ref 6.1–8.1)
Total Bilirubin: 0.5 mg/dL (ref 0.2–1.2)

## 2016-02-25 LAB — BASIC METABOLIC PANEL WITH GFR
BUN: 18 mg/dL (ref 7–25)
CO2: 29 mmol/L (ref 20–31)
Calcium: 10.1 mg/dL (ref 8.6–10.4)
Chloride: 100 mmol/L (ref 98–110)
Creat: 0.82 mg/dL (ref 0.60–0.93)
GFR, Est African American: 79 mL/min (ref >=60)
GFR, Est Non African American: 69 mL/min (ref >=60)
Glucose, Bld: 92 mg/dL (ref 65–99)
Potassium: 4.3 mmol/L (ref 3.5–5.3)
Sodium: 141 mmol/L (ref 135–146)

## 2016-02-25 LAB — LIPID PANEL
Cholesterol: 173 mg/dL (ref 125–200)
HDL: 76 mg/dL (ref >=46)
LDL CALC: 76 mg/dL (ref <130)
TRIGLYCERIDES: 104 mg/dL (ref <150)
Total CHOL/HDL Ratio: 2.3 Ratio (ref <=5.0)
VLDL: 21 mg/dL (ref <30)

## 2016-02-25 LAB — TSH: TSH: 2.35 mIU/L

## 2016-02-25 LAB — MAGNESIUM: Magnesium: 1.8 mg/dL (ref 1.5–2.5)

## 2016-02-25 NOTE — Progress Notes (Signed)
Patient ID: Tracey Todd, female   DOB: 06/14/1935, 81 y.o.   MRN: [id number]  [facility]                   Lucky CowboyWilliam Dakin Madani, M.D.    Dyanne CarrelAmanda R. Steffanie Dunnollier, P.A.-C      Terri Piedraourtney Forcucci, P.A.-C   [facility]                [address]                [place] [id number] Telephone [phone] Telefax [phone]  ______________________________________________________________________  Annual Screening/Preventative Visit And Comprehensive Evaluation &  Examination     [name] presents for a Wellness/Preventative Visit & comprehensive evaluation and management of multiple medical co-morbidities.  Patient has been followed for HTN, ASHD/SSS, T2_NIDDM, Hyperlipidemia and Vitamin D Deficiency.      HTN predates since circa 1990's. Patient's BP has been controlled at home and patient denies any cardiac symptoms as chest pain, palpitations, shortness of breath, dizziness or ankle swelling. In Feb 2016 patient was dx'd with SSS and had PPM inserted and heart cath showed Nl Cor Arteries.  Today's BP: 116/66 mmHg      Patient's hyperlipidemia is controlled with diet and medications. Patient denies myalgias or other medication SE's. Last lipids were at goal with  Cholesterol 151; HDL 67; LDL 67; Triglycerides 83 on 11/20/2015.     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 Last A1c was not at goal with 7.3% on 11/20/2015.     Finally, patient has history of Vitamin D Deficiency and last Vitamin D was 80 on 08/06/2015.   Medication Sig  . Acetaminophen ARTHRITIS PAIN Take 2 capsules by mouth daily as needed (arthritis).   Marland Kitchen. amLODipine  10 MG tablet TAKE ONE TABLET BY MOUTH ONCE DAILY  . aspirin 81 MG tablet Take 81 mg by mouth daily.  . OS-CAL 600 MG TABS  tablet Take 600 mg by mouth daily with breakfast.  . Cetirizine 10 mg Take 1 tablet by mouth daily.  Marland Kitchen. VITAMIN D 2000 UNITS  Take 6,000 Units by mouth daily.   . hctz 25 MG tablet Take 1 tablet (25 mg total) by mouth daily.  . Magnesium 500 MG TABS Take 500 mg by mouth 3 (three) times daily.   . metFORMIN -XR 500 MG t Take 1,000 mg by mouth 2 (two) times daily.  . naproxen  220 MG tablet Take 440 mg by mouth daily as needed (pain).  . Omeprazole 20 MG TBEC Take 1 tablet (20 mg total) by mouth daily.  . potassium chloride  20 MEQ tablet TAKE ONE TABLET BY MOUTH ONCE DAILY  . pravastatin  40 MG tablet TAKE ONE TABLET BY MOUTH AT BEDTIME FOR  CHOLESTEROL   Allergies  Allergen Reactions  . Penicillins Anaphylaxis  . Feldene [Piroxicam] Other (See Comments)    Bleeding   . Calcium-Containing Compounds Other (See Comments)    "calcium deposits on skin"  . Celebrex [Celecoxib] Itching  . Daypro [Oxaprozin] Itching   Past Medical History  Diagnosis Date  . Vitamin D deficiency   . Nodule of chest wall   . Heel pain     left foot  . Hypercholesteremia   . HTN (hypertension)   . Type II or  unspecified type diabetes mellitus without mention of complication, not stated as uncontrolled   . DJD (degenerative joint disease)   . Fatty liver   . Normal coronary arteries 2/10/03/14  . Acute CHF (HCC) 10/21/14    secondary to bradycardia   Health Maintenance  Topic Date Due  . OPHTHALMOLOGY EXAM  12/31/2015  . FOOT EXAM  01/09/2016  . URINE MICROALBUMIN  01/09/2016  . ZOSTAVAX  06/23/2017 (Originally 06/01/1997)  . INFLUENZA VACCINE  03/30/2016  . HEMOGLOBIN A1C  05/22/2016  . TETANUS/TDAP  08/30/2021  . DEXA SCAN  Completed  . PNA vac Low Risk Adult  Completed   Immunization History  Administered Date(s) Administered  . Influenza Split 05/23/2013  . Influenza, High Dose Seasonal PF 05/30/2014, 05/19/2015  . Pneumococcal Conjugate-13 08/06/2015  . Pneumococcal Polysaccharide-23  07/29/2013  . Td 08/31/2011   Past Surgical History  Procedure Date  . Tonsillectomy and adenoidectomy   . Total knee arthroplasty   . Breast surgery   . Cataract extraction   . Lt and rt heart cath with coronary angiogram -  Micheline Chapman, MD 10/23/2014  . Permanent pacemaker insertion  -  Hillis Range, MD 10/25/2014   Family History  Problem Relation Age of Onset  . Diabetes Mother   . Cancer Mother   . Cancer Father    Social History  Substance Use Topics  . Smoking status: Former Smoker    Quit date: 12/06/1963  . Smokeless tobacco: Not on file  . Alcohol Use: No    ROS Constitutional: Denies fever, chills, weight loss/gain, headaches, insomnia,  night sweats, and change in appetite. Does c/o fatigue. Eyes: Denies redness, blurred vision, diplopia, discharge, itchy, watery eyes.  ENT: Denies discharge, congestion, post nasal drip, epistaxis, sore throat, earache, hearing loss, dental pain, Tinnitus, Vertigo, Sinus pain, snoring.  Cardio: Denies chest pain, palpitations, irregular heartbeat, syncope, dyspnea, diaphoresis, orthopnea, PND, claudication, edema Respiratory: denies cough, dyspnea, DOE, pleurisy, hoarseness, laryngitis, wheezing.  Gastrointestinal: Denies dysphagia, heartburn, reflux, water brash, pain, cramps, nausea, vomiting, bloating, diarrhea, constipation, hematemesis, melena, hematochezia, jaundice, hemorrhoids Genitourinary: Denies dysuria, frequency, urgency, nocturia, hesitancy, discharge, hematuria, flank pain Breast: Breast lumps, nipple discharge, bleeding.  Musculoskeletal: Denies arthralgia, myalgia, stiffness, Jt. Swelling, pain, limp, and strain/sprain. Denies falls. Skin: Denies puritis, rash, hives, warts, acne, eczema, changing in skin lesion Neuro: No weakness, tremor, incoordination, spasms, paresthesia, pain Psychiatric: Denies confusion, memory loss, sensory loss. Denies Depression. Endocrine: Denies change in weight, skin, hair change,  nocturia, and paresthesia, diabetic polys, visual blurring, hyper / hypo glycemic episodes.  Heme/Lymph: No excessive bleeding, bruising, enlarged lymph nodes.  Physical Exam  BP 116/66 mmHg  Pulse 64  Temp(Src) 97.3 F (36.3 C)  Resp 16  Ht  (1.473 m)  Wt 171 lb 9.6 oz (77.837 kg)  BMI 35.87 kg/m2  General Appearance: Well nourished and in no apparent distress. Eyes: PERRLA, EOMs, conjunctiva no swelling or erythema, normal fundi and vessels. Sinuses: No frontal/maxillary tenderness ENT/Mouth: EACs patent / TMs  nl. Nares clear without erythema, swelling, mucoid exudates. Oral hygiene is good. No erythema, swelling, or exudate. Tongue normal, non-obstructing. Tonsils not swollen or erythematous. Hearing normal.  Neck: Supple, thyroid normal. No bruits, nodes or JVD. Respiratory: Respiratory effort normal.  BS equal and clear bilateral without rales, rhonci, wheezing or stridor. Cardio: Heart sounds are normal with regular rate and rhythm and no murmurs, rubs or gallops. Peripheral pulses are normal and equal bilaterally without edema. No aortic or femoral bruits.  Chest: symmetric with normal excursions and percussion. Breasts: Symmetric, without lumps, nipple discharge, retractions, or fibrocystic changes.  Abdomen: Flat, soft with bowel sounds active. Nontender, no guarding, rebound, hernias, masses, or organomegaly.  Lymphatics: Non tender without lymphadenopathy.  Musculoskeletal: Full ROM all peripheral extremities, joint stability, 5/5 strength, and normal gait. Skin: Warm and dry without rashes, lesions, cyanosis, clubbing or  ecchymosis.  Neuro: Cranial nerves intact, reflexes equal bilaterally. Normal muscle tone, no cerebellar symptoms. Sensation intact to touch, Vibratory and Monofilament to the toes bilaterally. Marland Kitchen.  Pysch: Alert and oriented X 3, normal affect, Insight and Judgment appropriate.   Assessment and Plan  1. Annual Preventative Screening Examination  -  Microalbumin / creatinine urine ratio - EKG 12-Lead - US, RETROPERITNL ABD,  LTD - HM DIABETES FOOT EXAM - LOW EXTREMITY NEUR EXAM DOCUM - CBC with Differential/Platelet - BASIC METABOLIC PANEL WITH GFR - Hepatic function panel - Magnesium - Lipid panel - TSH - Hemoglobin A1c - Insulin, random - VITAMIN D 25 Hydroxy   2. Essential hypertension  - EKG 12-Lead - US, RETROPERITNL ABD,  LTD - TSH  3. Hyperlipidemia  - Lipid panel - TSH  4. T2_NIDDM w/CKD  - Microalbumin / creatinine urine ratio - HM DIABETES FOOT EXAM - LOW EXTREMITY NEUR EXAM DOCUM - Hemoglobin A1c - Insulin, random  5. Vitamin D deficiency  - VITAMIN D 25 Hydroxy   6. Sick sinus syndrome (HCC)   7. Pacemaker implanted 10/25/14- [employer]   8. Obesity   9. Screening for rectal cancer   10. Medication management  - CBC with Differential/Platelet - BASIC METABOLIC PANEL WITH GFR - Hepatic function panel - Magnesium   Continue prudent diet as discussed, weight control, BP monitoring, regular exercise, and medications. Discussed med's effects and SE's. Screening labs and tests as requested with regular follow-up as recommended. Over 40 minutes of exam, counseling, chart review and high complex critical decision making was performed.

## 2016-02-25 NOTE — Patient Instructions (Signed)
Recommend Adult Low Dose Aspirin or   coated  Aspirin 81 mg daily   To reduce risk of Colon Cancer 20 %,   Skin Cancer 26 % ,   Melanoma 46%   and   Pancreatic cancer 60%   ++++++++++++++++++++++++++++++++++++++++++++++++++++++ Vitamin D goal   is between 70-100.   Please make sure that you are taking your Vitamin D as directed.   It is very important as a natural anti-inflammatory   helping hair, skin, and nails, as well as reducing stroke and heart attack risk.   It helps your bones and helps with mood.  It also decreases numerous cancer risks so please take it as directed.   Low Vit D is associated with a 200-300% higher risk for CANCER   and 200-300% higher risk for HEART   ATTACK  &  STROKE.   .....................................Marland Kitchen  It is also associated with higher death rate at younger ages,   autoimmune diseases like Rheumatoid arthritis, Lupus, Multiple Sclerosis.     Also many other serious conditions, like depression, Alzheimer's  Dementia, infertility, muscle aches, fatigue, fibromyalgia - just to name a few.  ++++++++++++++++++++++++++++++++++++++++++++++++  Recommend the book "The END of DIETING" by Dr Excell Seltzer   & the book "The END of DIABETES " by Dr Excell Seltzer  At [email or web address] - get book & Audio CD's     Being diabetic has a  300% increased risk for heart attack, stroke, cancer, and alzheimer- type vascular dementia. It is very important that you work harder with diet by avoiding all foods that are white. Avoid white rice (brown & wild rice is OK), white potatoes (sweetpotatoes in moderation is OK), White bread or wheat bread or anything made out of white flour like bagels, donuts, rolls, buns, biscuits, cakes, pastries, cookies, pizza crust, and pasta (made from white flour & egg whites) - vegetarian pasta or spinach or wheat pasta is OK. Multigrain breads like Arnold's or Pepperidge [employer], or multigrain sandwich thins or flatbreads.  Diet,  exercise and weight loss can reverse and cure diabetes in the early stages.  Diet, exercise and weight loss is very important in the control and prevention of complications of diabetes which affects every system in your body, ie. Brain - dementia/stroke, eyes - glaucoma/blindness, heart - heart attack/heart failure, kidneys - dialysis, stomach - gastric paralysis, intestines - malabsorption, nerves - severe painful neuritis, circulation - gangrene & loss of a leg(s), and finally cancer and Alzheimers.    I recommend avoid fried & greasy foods,  sweets/candy, white rice (brown or wild rice or Quinoa is OK), white potatoes (sweet potatoes are OK) - anything made from white flour - bagels, doughnuts, rolls, buns, biscuits,white and wheat breads, pizza crust and traditional pasta made of white flour & egg white(vegetarian pasta or spinach or wheat pasta is OK).  Multi-grain bread is OK - like multi-grain flat bread or sandwich thins. Avoid alcohol in excess. Exercise is also important.    Eat all the vegetables you want - avoid meat, especially red meat and dairy - especially cheese.  Cheese is the most concentrated form of trans-fats which is the worst thing to clog up our arteries. Veggie cheese is OK which can be found in the fresh produce section at [employer] or [employer] or [employer]  ++++++++++++++++++++++++++++++++++++++++++++++++++ DASH Eating Plan  DASH stands for "Dietary Approaches to Stop Hypertension."   The DASH eating plan is a healthy eating plan that has been shown to reduce high blood  pressure (hypertension). Additional health benefits may include reducing the risk of type 2 diabetes mellitus, heart disease, and stroke. The DASH eating plan may also help with weight loss.  WHAT DO I NEED TO KNOW ABOUT THE DASH EATING PLAN?  For the DASH eating plan, you will follow these general guidelines:  Choose foods with a percent daily value for sodium of less than 5% (as listed on the food  label).  Use salt-free seasonings or herbs instead of table salt or sea salt.  Check with your health care provider or pharmacist before using salt substitutes.  Eat lower-sodium products, often labeled as "lower sodium" or "no salt added."  Eat fresh foods.  Eat more vegetables, fruits, and low-fat dairy products.    Choose whole grains. Look for the word "whole" as the first word in the ingredient list.  Choose fish   Limit sweets, desserts, sugars, and sugary drinks.  Choose heart-healthy fats.  Eat veggie cheese   Eat more home-cooked food and less restaurant, buffet, and fast food.  Limit fried foods.  Huffaker foods using methods other than frying.  Limit canned vegetables. If you do use them, rinse them well to decrease the sodium.  When eating at a restaurant, ask that your food be prepared with less salt, or no salt if possible.                      WHAT FOODS CAN I EAT?  Read Dr Fara Olden Fuhrman's books on The End of Dieting & The End of Diabetes  Grains  Whole grain or whole wheat bread. Brown rice. Whole grain or whole wheat pasta. Quinoa, bulgur, and whole grain cereals. Low-sodium cereals. Corn or whole wheat flour tortillas. Whole grain cornbread. Whole grain crackers. Low-sodium crackers.  Vegetables  Fresh or frozen vegetables (raw, steamed, roasted, or grilled). Low-sodium or reduced-sodium tomato and vegetable juices. Low-sodium or reduced-sodium tomato sauce and paste. Low-sodium or reduced-sodium canned vegetables.   Fruits  All fresh, canned (in natural juice), or frozen fruits.  Protein Products   All fish and seafood.  Dried beans, peas, or lentils. Unsalted nuts and seeds. Unsalted canned beans.  Dairy  Low-fat dairy products, such as skim or 1% milk, 2% or reduced-fat cheeses, low-fat ricotta or cottage cheese, or plain low-fat yogurt. Low-sodium or reduced-sodium cheeses.  Fats and Oils  Tub margarines without trans fats. Light or  reduced-fat mayonnaise and salad dressings (reduced sodium). Avocado. Safflower, olive, or canola oils. Natural peanut or almond butter.  Other  Unsalted popcorn and pretzels. The items listed above may not be a complete list of recommended foods or beverages. Contact your dietitian for more options.  +++++++++++++++++++++++++++++++++++++++++++  WHAT FOODS ARE NOT RECOMMENDED?  Grains/ White flour or wheat flour  White bread. White pasta. White rice. Refined cornbread. Bagels and croissants. Crackers that contain trans fat.  Vegetables  Creamed or fried vegetables. Vegetables in a . Regular canned vegetables. Regular canned tomato sauce and paste. Regular tomato and vegetable juices.  Fruits  Dried fruits. Canned fruit in light or heavy syrup. Fruit juice.  Meat and Other Protein Products  Meat in general - RED mwaet & White meat.  Fatty cuts of meat. Ribs, chicken wings, bacon, sausage, bologna, salami, chitterlings, fatback, hot dogs, bratwurst, and packaged luncheon meats.  Dairy  Whole or 2% milk, cream, half-and-half, and cream cheese. Whole-fat or sweetened yogurt. Full-fat cheeses or blue cheese. Nondairy creamers and whipped toppings. Processed cheese, cheese spreads, or  cheese curds.  Condiments  Onion and garlic salt, seasoned salt, table salt, and sea salt. Canned and packaged gravies. Worcestershire sauce. Tartar sauce. Barbecue sauce. Teriyaki sauce. Soy sauce, including reduced sodium. Steak sauce. Fish sauce. Oyster sauce. Cocktail sauce. Horseradish. Ketchup and mustard. Meat flavorings and tenderizers. Bouillon cubes. Hot sauce. Tabasco sauce. Marinades. Taco seasonings. Relishes.  Fats and Oils Butter, stick margarine, lard, shortening and bacon fat. Coconut, palm kernel, or palm oils. Regular salad dressings.  Pickles and olives. Salted popcorn and pretzels.  The items listed above may not be a complete list of foods and beverages to avoid.   Preventive  Care for Adults  A healthy lifestyle and preventive care can promote health and wellness. Preventive health guidelines for women include the following key practices.  A routine yearly physical is a good way to check with your health care provider about your health and preventive screening. It is a chance to share any concerns and updates on your health and to receive a thorough exam.  Visit your dentist for a routine exam and preventive care every 6 months. Brush your teeth twice a day and floss once a day. Good oral hygiene prevents tooth decay and gum disease.  The frequency of eye exams is based on your age, health, family medical history, use of contact lenses, and other factors. Follow your health care provider's recommendations for frequency of eye exams.  Eat a healthy diet. Foods like vegetables, fruits, whole grains, low-fat dairy products, and lean protein foods contain the nutrients you need without too many calories. Decrease your intake of foods high in solid fats, added sugars, and salt. Eat the right amount of calories for you.Get information about a proper diet from your health care provider, if necessary.  Regular physical exercise is one of the most important things you can do for your health. Most adults should get at least 150 minutes of moderate-intensity exercise (any activity that increases your heart rate and causes you to sweat) each week. In addition, most adults need muscle-strengthening exercises on 2 or more days a week.  Maintain a healthy weight. The body mass index (BMI) is a screening tool to identify possible weight problems. It provides an estimate of body fat based on height and weight. Your health care provider can find your BMI and can help you achieve or maintain a healthy weight.For adults 20 years and older:  A BMI below 18.5 is considered underweight.  A BMI of 18.5 to 24.9 is normal.  A BMI of 25 to 29.9 is considered overweight.  A BMI of 30 and  above is considered obese.  Maintain normal blood lipids and cholesterol levels by exercising and minimizing your intake of saturated fat. Eat a balanced diet with plenty of fruit and vegetables. If your lipid or cholesterol levels are high, you are over 50, or you are at high risk for heart disease, you may need your cholesterol levels checked more frequently.Ongoing high lipid and cholesterol levels should be treated with medicines if diet and exercise are not working.  If you smoke, find out from your health care provider how to quit. If you do not use tobacco, do not start.  Lung cancer screening is recommended for adults aged 56-80 years who are at high risk for developing lung cancer because of a history of smoking. A yearly low-dose CT scan of the lungs is recommended for people who have at least a 30-pack-year history of smoking and are a current smoker or  have quit within the past 15 years. A pack year of smoking is smoking an average of 1 pack of cigarettes a day for 1 year (for example: 1 pack a day for 30 years or 2 packs a day for 15 years). Yearly screening should continue until the smoker has stopped smoking for at least 15 years. Yearly screening should be stopped for people who develop a health problem that would prevent them from having lung cancer treatment.  Avoid use of street drugs. Do not share needles with anyone. Ask for help if you need support or instructions about stopping the use of drugs.  High blood pressure causes heart disease and increases the risk of stroke.  Ongoing high blood pressure should be treated with medicines if weight loss and exercise do not work.  If you are 52-67 years old, ask your health care provider if you should take aspirin to prevent strokes.  Diabetes screening involves taking a blood sample to check your fasting blood sugar level. This should be done once every 3 years, after age 9, if you are within normal weight and without risk factors for  diabetes. Testing should be considered at a younger age or be carried out more frequently if you are overweight and have at least 1 risk factor for diabetes.  Breast cancer screening is essential preventive care for women. You should practice "breast self-awareness." This means understanding the normal appearance and feel of your breasts and may include breast self-examination. Any changes detected, no matter how small, should be reported to a health care provider. Women in their 45s and 30s should have a clinical breast exam (CBE) by a health care provider as part of a regular health exam every 1 to 3 years. After age 76, women should have a CBE every year. Starting at age 54, women should consider having a mammogram (breast X-ray test) every year. Women who have a family history of breast cancer should talk to their health care provider about genetic screening. Women at a high risk of breast cancer should talk to their health care providers about having an MRI and a mammogram every year.  Breast cancer gene (BRCA)-related cancer risk assessment is recommended for women who have family members with BRCA-related cancers. BRCA-related cancers include breast, ovarian, tubal, and peritoneal cancers. Having family members with these cancers may be associated with an increased risk for harmful changes (mutations) in the breast cancer genes BRCA1 and BRCA2. Results of the assessment will determine the need for genetic counseling and BRCA1 and BRCA2 testing.  Routine pelvic exams to screen for cancer are no longer recommended for nonpregnant women who are considered low risk for cancer of the pelvic organs (ovaries, uterus, and vagina) and who do not have symptoms. Ask your health care provider if a screening pelvic exam is right for you.  If you have had past treatment for cervical cancer or a condition that could lead to cancer, you need Pap tests and screening for cancer for at least 20 years after your  treatment. If Pap tests have been discontinued, your risk factors (such as having a new sexual partner) need to be reassessed to determine if screening should be resumed. Some women have medical problems that increase the chance of getting cervical cancer. In these cases, your health care provider may recommend more frequent screening and Pap tests.    Colorectal cancer can be detected and often prevented. Most routine colorectal cancer screening begins at the age of 73 years and  continues through age 75 years. However, your health care provider may recommend screening at an earlier age if you have risk factors for colon cancer. On a yearly basis, your health care provider may provide home test kits to check for hidden blood in the stool. Use of a small camera at the end of a tube, to directly examine the colon (sigmoidoscopy or colonoscopy), can detect the earliest forms of colorectal cancer. Talk to your health care provider about this at age 50, when routine screening begins. Direct exam of the colon should be repeated every 5-10 years through age 75 years, unless early forms of pre-cancerous polyps or small growths are found.  Osteoporosis is a disease in which the bones lose minerals and strength with aging. This can result in serious bone fractures or breaks. The risk of osteoporosis can be identified using a bone density scan. Women ages 65 years and over and women at risk for fractures or osteoporosis should discuss screening with their health care providers. Ask your health care provider whether you should take a calcium supplement or vitamin D to reduce the rate of osteoporosis.  Menopause can be associated with physical symptoms and risks. Hormone replacement therapy is available to decrease symptoms and risks. You should talk to your health care provider about whether hormone replacement therapy is right for you.  Use sunscreen. Apply sunscreen liberally and repeatedly throughout the day. You  should seek shade when your shadow is shorter than you. Protect yourself by wearing long sleeves, pants, a wide-brimmed hat, and sunglasses year round, whenever you are outdoors.  Once a month, do a whole body skin exam, using a mirror to look at the skin on your back. Tell your health care provider of new moles, moles that have irregular borders, moles that are larger than a pencil eraser, or moles that have changed in shape or color.  Stay current with required vaccines (immunizations).  Influenza vaccine. All adults should be immunized every year.  Tetanus, diphtheria, and acellular pertussis (Td, Tdap) vaccine. Pregnant women should receive 1 dose of Tdap vaccine during each pregnancy. The dose should be obtained regardless of the length of time since the last dose. Immunization is preferred during the 27th-36th week of gestation. An adult who has not previously received Tdap or who does not know her vaccine status should receive 1 dose of Tdap. This initial dose should be followed by tetanus and diphtheria toxoids (Td) booster doses every 10 years. Adults with an unknown or incomplete history of completing a 3-dose immunization series with Td-containing vaccines should begin or complete a primary immunization series including a Tdap dose. Adults should receive a Td booster every 10 years.    Zoster vaccine. One dose is recommended for adults aged 60 years or older unless certain conditions are present.    Pneumococcal 13-valent conjugate (PCV13) vaccine. When indicated, a person who is uncertain of her immunization history and has no record of immunization should receive the PCV13 vaccine. An adult aged 19 years or older who has certain medical conditions and has not been previously immunized should receive 1 dose of PCV13 vaccine. This PCV13 should be followed with a dose of pneumococcal polysaccharide (PPSV23) vaccine. The PPSV23 vaccine dose should be obtained at least 8 weeks after the dose  of PCV13 vaccine. An adult aged 19 years or older who has certain medical conditions and previously received 1 or more doses of PPSV23 vaccine should receive 1 dose of PCV13. The PCV13 vaccine dose should   be obtained 1 or more years after the last PPSV23 vaccine dose.    Pneumococcal polysaccharide (PPSV23) vaccine. When PCV13 is also indicated, PCV13 should be obtained first. All adults aged 65 years and older should be immunized. An adult younger than age 65 years who has certain medical conditions should be immunized. Any person who resides in a nursing home or long-term care facility should be immunized. An adult smoker should be immunized. People with an immunocompromised condition and certain other conditions should receive both PCV13 and PPSV23 vaccines. People with human immunodeficiency virus (HIV) infection should be immunized as soon as possible after diagnosis. Immunization during chemotherapy or radiation therapy should be avoided. Routine use of PPSV23 vaccine is not recommended for American Indians, [place] Natives, or people younger than 65 years unless there are medical conditions that require PPSV23 vaccine. When indicated, people who have unknown immunization and have no record of immunization should receive PPSV23 vaccine. One-time revaccination 5 years after the first dose of PPSV23 is recommended for people aged 19-64 years who have chronic kidney failure, nephrotic syndrome, asplenia, or immunocompromised conditions. People who received 1-2 doses of PPSV23 before age 65 years should receive another dose of PPSV23 vaccine at age 65 years or later if at least 5 years have passed since the previous dose. Doses of PPSV23 are not needed for people immunized with PPSV23 at or after age 65 years.   Preventive Services / Frequency  Ages 65 years and over  Blood pressure check.  Lipid and cholesterol check.  Lung cancer screening. / Every year if you are aged 55-80 years and have a  30-pack-year history of smoking and currently smoke or have quit within the past 15 years. Yearly screening is stopped once you have quit smoking for at least 15 years or develop a health problem that would prevent you from having lung cancer treatment.  Clinical breast exam.** / Every year after age 40 years.  BRCA-related cancer risk assessment.** / For women who have family members with a BRCA-related cancer (breast, ovarian, tubal, or peritoneal cancers).  Mammogram.** / Every year beginning at age 40 years and continuing for as long as you are in good health. Consult with your health care provider.  Pap test.** / Every 3 years starting at age 30 years through age 65 or 70 years with 3 consecutive normal Pap tests. Testing can be stopped between 65 and 70 years with 3 consecutive normal Pap tests and no abnormal Pap or HPV tests in the past 10 years.  Fecal occult blood test (FOBT) of stool. / Every year beginning at age 50 years and continuing until age 75 years. You may not need to do this test if you get a colonoscopy every 10 years.  Flexible sigmoidoscopy or colonoscopy.** / Every 5 years for a flexible sigmoidoscopy or every 10 years for a colonoscopy beginning at age 50 years and continuing until age 75 years.  Hepatitis C blood test.** / For all people born from 1945 through 1965 and any individual with known risks for hepatitis C.  Osteoporosis screening.** / A one-time screening for women ages 65 years and over and women at risk for fractures or osteoporosis.  Skin self-exam. / Monthly.  Influenza vaccine. / Every year.  Tetanus, diphtheria, and acellular pertussis (Tdap/Td) vaccine.** / 1 dose of Td every 10 years.  Zoster vaccine.** / 1 dose for adults aged 60 years or older.  Pneumococcal 13-valent conjugate (PCV13) vaccine.** / Consult your health care provider.    Pneumococcal polysaccharide (PPSV23) vaccine.** / 1 dose for all adults aged 65 years and older. Screening  for abdominal aortic aneurysm (AAA)  by ultrasound is recommended for people who have history of high blood pressure or who are current or former smokers. 

## 2016-02-26 LAB — CBC WITH DIFFERENTIAL/PLATELET
BASOS ABS: 67 {cells}/uL (ref 0–200)
BASOS PCT: 1 %
Eosinophils Absolute: 134 cells/uL (ref 15–500)
Eosinophils Relative: 2 %
HEMATOCRIT: 37.4 % (ref 35.0–45.0)
HEMOGLOBIN: 11.7 g/dL (ref 11.7–15.5)
LYMPHS ABS: 1876 {cells}/uL (ref 850–3900)
Lymphocytes Relative: 28 %
MCH: 27.3 pg (ref 27.0–33.0)
MCHC: 31.3 g/dL — ABNORMAL LOW (ref 32.0–36.0)
MCV: 87.4 fL (ref 80.0–100.0)
MPV: 9.8 fL (ref 7.5–12.5)
Monocytes Absolute: 603 cells/uL (ref 200–950)
Monocytes Relative: 9 %
NEUTROS PCT: 60 %
Neutro Abs: 4020 cells/uL (ref 1500–7800)
Platelets: 297 10*3/uL (ref 140–400)
RBC: 4.28 MIL/uL (ref 3.80–5.10)
RDW: 14.8 % (ref 11.0–15.0)
WBC: 6.7 10*3/uL (ref 3.8–10.8)

## 2016-02-26 LAB — HEMOGLOBIN A1C
Hgb A1c MFr Bld: 6.8 % — ABNORMAL HIGH (ref <5.7)
MEAN PLASMA GLUCOSE: 148 mg/dL

## 2016-02-26 LAB — MICROALBUMIN / CREATININE URINE RATIO
Creatinine, Urine: 103 mg/dL (ref 20–320)
MICROALB UR: 1.9 mg/dL
MICROALB/CREAT RATIO: 18 ug/mg{creat} (ref <30)

## 2016-02-26 LAB — VITAMIN D 25 HYDROXY (VIT D DEFICIENCY, FRACTURES): Vit D, 25-Hydroxy: 66 ng/mL (ref 30–100)

## 2016-02-26 LAB — INSULIN, RANDOM: Insulin: 12.5 u[IU]/mL (ref 2.0–19.6)

## 2016-03-04 ENCOUNTER — Other Ambulatory Visit: Payer: Self-pay | Admitting: Internal Medicine

## 2016-03-15 ENCOUNTER — Other Ambulatory Visit: Payer: Self-pay | Admitting: *Deleted

## 2016-03-15 DIAGNOSIS — Z1212 Encounter for screening for malignant neoplasm of rectum: Secondary | ICD-10-CM

## 2016-03-15 LAB — POC HEMOCCULT BLD/STL (HOME/3-CARD/SCREEN)
Card #2 Fecal Occult Blod, POC: NEGATIVE
FECAL OCCULT BLD: NEGATIVE
FECAL OCCULT BLD: NEGATIVE

## 2016-03-17 ENCOUNTER — Other Ambulatory Visit: Payer: Self-pay | Admitting: Internal Medicine

## 2016-03-17 ENCOUNTER — Other Ambulatory Visit: Payer: Self-pay | Admitting: Physician Assistant

## 2016-03-17 DIAGNOSIS — E1121 Type 2 diabetes mellitus with diabetic nephropathy: Secondary | ICD-10-CM | POA: Diagnosis not present

## 2016-03-17 DIAGNOSIS — E119 Type 2 diabetes mellitus without complications: Secondary | ICD-10-CM | POA: Diagnosis not present

## 2016-03-17 MED ORDER — FREESTYLE LANCETS MISC: Status: DC

## 2016-03-26 ENCOUNTER — Other Ambulatory Visit: Payer: Self-pay | Admitting: Internal Medicine

## 2016-04-28 ENCOUNTER — Ambulatory Visit (INDEPENDENT_AMBULATORY_CARE_PROVIDER_SITE_OTHER): Payer: Medicare Other | Admitting: Podiatry

## 2016-04-28 DIAGNOSIS — B351 Tinea unguium: Secondary | ICD-10-CM | POA: Diagnosis not present

## 2016-04-28 DIAGNOSIS — M79676 Pain in unspecified toe(s): Secondary | ICD-10-CM | POA: Diagnosis not present

## 2016-04-28 DIAGNOSIS — E1121 Type 2 diabetes mellitus with diabetic nephropathy: Secondary | ICD-10-CM | POA: Diagnosis not present

## 2016-04-28 NOTE — Progress Notes (Signed)
Patient ID: Tracey Todd, female   DOB: 07/26/1935, 80 y.o.   MRN: [id number] Complaint:  Visit Type: Patient returns to my office for continued preventative foot care services. Complaint: Patient states" my nails have grown long and thick and become painful to walk and wear shoes" Patient has been diagnosed with DM with neuropathy.. The patient presents for preventative foot care services. No changes to ROS  Podiatric Exam: Vascular: dorsalis pedis and posterior tibial pulses are palpable bilateral. Capillary return is immediate. Temperature gradient is WNL. Skin turgor WNL  Sensorium: Normal Semmes Weinstein monofilament test. Normal tactile sensation bilaterally. Nail Exam: Pt has thick disfigured discolored nails with subungual debris noted bilateral entire nail hallux through fifth toenails Ulcer Exam: There is no evidence of ulcer or pre-ulcerative changes or infection. Orthopedic Exam: Muscle tone and strength are WNL. No limitations in general ROM. No crepitus or effusions noted. Foot type and digits show no abnormalities. Bony prominences are unremarkable. Skin: No Porokeratosis. No infection or ulcers  Diagnosis:  Onychomycosis, , Pain in right toe, pain in left toes  Treatment & Plan Procedures and Treatment: Consent by patient was obtained for treatment procedures. The patient understood the discussion of treatment and procedures well. All questions were answered thoroughly reviewed. Debridement of mycotic and hypertrophic toenails, 1 through 5 bilateral and clearing of subungual debris. No ulceration, no infection noted. Minimal mechanical debridement requested. Return Visit-Office Procedure: Patient instructed to return to the office for a follow up visit 3 months for continued evaluation and treatment.    Aeron Lheureux DPM 

## 2016-04-29 ENCOUNTER — Other Ambulatory Visit: Payer: Self-pay | Admitting: Internal Medicine

## 2016-04-29 MED ORDER — ATORVASTATIN CALCIUM 80 MG PO TABS: ORAL_TABLET | ORAL | 1 refills | Status: DC

## 2016-05-01 DIAGNOSIS — E119 Type 2 diabetes mellitus without complications: Secondary | ICD-10-CM | POA: Diagnosis not present

## 2016-05-05 ENCOUNTER — Ambulatory Visit (INDEPENDENT_AMBULATORY_CARE_PROVIDER_SITE_OTHER): Payer: Medicare Other | Admitting: *Deleted

## 2016-05-05 DIAGNOSIS — I495 Sick sinus syndrome: Secondary | ICD-10-CM | POA: Diagnosis not present

## 2016-05-05 NOTE — Progress Notes (Signed)
Remote pacemaker transmission.   

## 2016-05-07 ENCOUNTER — Encounter: Payer: Self-pay | Admitting: Cardiology

## 2016-05-14 LAB — CUP PACEART REMOTE DEVICE CHECK
Battery Remaining Percentage: 95.5 %
Brady Statistic AP VP Percent: 2.1 %
Brady Statistic AP VS Percent: 65 %
Brady Statistic AS VP Percent: 1 %
Brady Statistic RV Percent Paced: 2.2 %
Date Time Interrogation Session: 20170906060017
Implantable Lead Implant Date: 20160226
Implantable Lead Location: 753860
Lead Channel Impedance Value: 490 Ohm
Lead Channel Pacing Threshold Amplitude: 1 V
Lead Channel Pacing Threshold Pulse Width: 0.4 ms
Lead Channel Sensing Intrinsic Amplitude: 3.6 mV
Lead Channel Sensing Intrinsic Amplitude: 6.7 mV
Lead Channel Setting Pacing Amplitude: 1.75 V
Lead Channel Setting Pacing Amplitude: 2.5 V
Lead Channel Setting Pacing Pulse Width: 0.4 ms
Lead Channel Setting Sensing Sensitivity: 2 mV
MDC IDC LEAD IMPLANT DT: 20160226
MDC IDC LEAD LOCATION: 753859
MDC IDC LEAD MODEL: 1948
MDC IDC MSMT BATTERY REMAINING LONGEVITY: 124 mo
MDC IDC MSMT BATTERY VOLTAGE: 3.01 V
MDC IDC MSMT LEADCHNL RA IMPEDANCE VALUE: 480 Ohm
MDC IDC MSMT LEADCHNL RA PACING THRESHOLD AMPLITUDE: 0.75 V
MDC IDC MSMT LEADCHNL RA PACING THRESHOLD PULSEWIDTH: 0.4 ms
MDC IDC PG SERIAL: 7700661
MDC IDC STAT BRADY AS VS PERCENT: 33 %
MDC IDC STAT BRADY RA PERCENT PACED: 66 %

## 2016-06-02 ENCOUNTER — Ambulatory Visit (INDEPENDENT_AMBULATORY_CARE_PROVIDER_SITE_OTHER): Payer: Medicare Other | Admitting: Internal Medicine

## 2016-06-02 ENCOUNTER — Encounter: Payer: Self-pay | Admitting: Internal Medicine

## 2016-06-02 VITALS — BP 122/64 | HR 60 | Temp 98.2°F | Resp 16 | Ht <= 58 in | Wt 174.0 lb

## 2016-06-02 DIAGNOSIS — E782 Mixed hyperlipidemia: Secondary | ICD-10-CM

## 2016-06-02 DIAGNOSIS — I480 Paroxysmal atrial fibrillation: Secondary | ICD-10-CM | POA: Diagnosis not present

## 2016-06-02 DIAGNOSIS — Z96659 Presence of unspecified artificial knee joint: Secondary | ICD-10-CM

## 2016-06-02 DIAGNOSIS — Z0389 Encounter for observation for other suspected diseases and conditions ruled out: Secondary | ICD-10-CM | POA: Diagnosis not present

## 2016-06-02 DIAGNOSIS — M8949 Other hypertrophic osteoarthropathy, multiple sites: Secondary | ICD-10-CM

## 2016-06-02 DIAGNOSIS — Z23 Encounter for immunization: Secondary | ICD-10-CM | POA: Diagnosis not present

## 2016-06-02 DIAGNOSIS — IMO0001 Reserved for inherently not codable concepts without codable children: Secondary | ICD-10-CM

## 2016-06-02 DIAGNOSIS — Z0000 Encounter for general adult medical examination without abnormal findings: Secondary | ICD-10-CM

## 2016-06-02 DIAGNOSIS — M150 Primary generalized (osteo)arthritis: Secondary | ICD-10-CM | POA: Diagnosis not present

## 2016-06-02 DIAGNOSIS — E559 Vitamin D deficiency, unspecified: Secondary | ICD-10-CM

## 2016-06-02 DIAGNOSIS — R6889 Other general symptoms and signs: Secondary | ICD-10-CM

## 2016-06-02 DIAGNOSIS — Z6834 Body mass index (BMI) 34.0-34.9, adult: Secondary | ICD-10-CM | POA: Diagnosis not present

## 2016-06-02 DIAGNOSIS — Z0001 Encounter for general adult medical examination with abnormal findings: Secondary | ICD-10-CM | POA: Diagnosis not present

## 2016-06-02 DIAGNOSIS — I272 Pulmonary hypertension, unspecified: Secondary | ICD-10-CM | POA: Diagnosis not present

## 2016-06-02 DIAGNOSIS — E6609 Other obesity due to excess calories: Secondary | ICD-10-CM

## 2016-06-02 DIAGNOSIS — I10 Essential (primary) hypertension: Secondary | ICD-10-CM | POA: Diagnosis not present

## 2016-06-02 DIAGNOSIS — E1121 Type 2 diabetes mellitus with diabetic nephropathy: Secondary | ICD-10-CM | POA: Diagnosis not present

## 2016-06-02 DIAGNOSIS — K760 Fatty (change of) liver, not elsewhere classified: Secondary | ICD-10-CM | POA: Diagnosis not present

## 2016-06-02 DIAGNOSIS — T783XXA Angioneurotic edema, initial encounter: Secondary | ICD-10-CM

## 2016-06-02 DIAGNOSIS — M159 Polyosteoarthritis, unspecified: Secondary | ICD-10-CM

## 2016-06-02 DIAGNOSIS — I495 Sick sinus syndrome: Secondary | ICD-10-CM | POA: Diagnosis not present

## 2016-06-02 DIAGNOSIS — I509 Heart failure, unspecified: Secondary | ICD-10-CM

## 2016-06-02 DIAGNOSIS — Z950 Presence of cardiac pacemaker: Secondary | ICD-10-CM

## 2016-06-02 DIAGNOSIS — Z79899 Other long term (current) drug therapy: Secondary | ICD-10-CM | POA: Diagnosis not present

## 2016-06-02 LAB — CBC WITH DIFFERENTIAL/PLATELET
BASOS ABS: 53 {cells}/uL (ref 0–200)
BASOS PCT: 1 %
EOS ABS: 106 {cells}/uL (ref 15–500)
Eosinophils Relative: 2 %
HEMATOCRIT: 35.3 % (ref 35.0–45.0)
Hemoglobin: 11.8 g/dL (ref 11.7–15.5)
Lymphocytes Relative: 36 %
Lymphs Abs: 1908 cells/uL (ref 850–3900)
MCH: 27.4 pg (ref 27.0–33.0)
MCHC: 33.4 g/dL (ref 32.0–36.0)
MCV: 81.9 fL (ref 80.0–100.0)
MONO ABS: 530 {cells}/uL (ref 200–950)
MPV: 9.5 fL (ref 7.5–12.5)
Monocytes Relative: 10 %
NEUTROS ABS: 2703 {cells}/uL (ref 1500–7800)
Neutrophils Relative %: 51 %
Platelets: 295 10*3/uL (ref 140–400)
RBC: 4.31 MIL/uL (ref 3.80–5.10)
RDW: 15.3 % — ABNORMAL HIGH (ref 11.0–15.0)
WBC: 5.3 10*3/uL (ref 3.8–10.8)

## 2016-06-02 LAB — BASIC METABOLIC PANEL WITH GFR
BUN: 15 mg/dL (ref 7–25)
CHLORIDE: 102 mmol/L (ref 98–110)
CO2: 24 mmol/L (ref 20–31)
Calcium: 10 mg/dL (ref 8.6–10.4)
Creat: 0.86 mg/dL (ref 0.60–0.93)
GFR, Est African American: 74 mL/min (ref >=60)
GFR, Est Non African American: 64 mL/min (ref >=60)
GLUCOSE: 99 mg/dL (ref 65–99)
POTASSIUM: 4 mmol/L (ref 3.5–5.3)
Sodium: 140 mmol/L (ref 135–146)

## 2016-06-02 LAB — LIPID PANEL
CHOLESTEROL: 146 mg/dL (ref 125–200)
HDL: 66 mg/dL (ref >=46)
LDL Cholesterol: 66 mg/dL (ref <130)
TRIGLYCERIDES: 68 mg/dL (ref <150)
Total CHOL/HDL Ratio: 2.2 Ratio (ref <=5.0)
VLDL: 14 mg/dL (ref <30)

## 2016-06-02 LAB — HEPATIC FUNCTION PANEL
ALBUMIN: 4.1 g/dL (ref 3.6–5.1)
ALT: 6 U/L (ref 6–29)
AST: 14 U/L (ref 10–35)
Alkaline Phosphatase: 51 U/L (ref 33–130)
BILIRUBIN DIRECT: 0.1 mg/dL (ref <=0.2)
BILIRUBIN TOTAL: 0.5 mg/dL (ref 0.2–1.2)
Indirect Bilirubin: 0.4 mg/dL (ref 0.2–1.2)
Total Protein: 6.3 g/dL (ref 6.1–8.1)

## 2016-06-02 LAB — TSH: TSH: 2.07 m[IU]/L

## 2016-06-02 MED ORDER — POTASSIUM CHLORIDE CRYS ER 20 MEQ PO TBCR: 20.0000 meq | EXTENDED_RELEASE_TABLET | Freq: Every day | ORAL | 0 refills | Status: DC

## 2016-06-02 MED ORDER — AMLODIPINE BESYLATE 10 MG PO TABS: 10.0000 mg | ORAL_TABLET | Freq: Every day | ORAL | 3 refills | Status: DC

## 2016-06-02 NOTE — Progress Notes (Signed)
MEDICARE ANNUAL WELLNESS VISIT AND FOLLOW UP  Assessment:    1. Need for prophylactic vaccination and inoculation against influenza  - Flu vaccine HIGH DOSE PF (Fluzone High dose)  2. Congestive heart failure, unspecified congestive heart failure chronicity, unspecified congestive heart failure type (HCC) -euvolemic -cont current meds -dash diet -exericse as tolerated -followed by cards  3. Essential hypertension -cont current meds -dash diet -exercise as tolerated -monitor at home - TSH  4. Moderate to severe pulmonary hypertension -cont current meds -followed by cards  5. PAF-fib and flutter -cont rate control  6. Sick sinus syndrome (HCC) -followed by cards  7. Fatty liver -cont diet and exercise - Hepatic function panel  8. Controlled type 2 diabetes mellitus with diabetic nephropathy, unspecified long term insulin use status (HCC) -cont metformin -close to goal -cont exercise as tolerated - Hemoglobin A1c  9. 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 Primary osteoarthritis involving multiple joints -tylenol prn  11. Angioedema, initial encounter -resolved  12. BMI 34.0-34.9,adult -cont diet and exercise  13. History of total knee replacement, unspecified laterality -doing well -followed by ortho  14. Hyperlipidemia -cont meds - Lipid panel  15. Medication management  - CBC with Differential/Platelet - BASIC METABOLIC PANEL WITH GFR  16. Normal coronary arteries and LVF 10/23/14 -followed by cards  17. Class 1 obesity due to excess calories with serious comorbidity and body mass index (BMI) of 34.0 to 34.9 in adult -cont diet and exercise  18. Pacemaker implanted 10/25/14- [employer] -followed by cards  19. Vitamin D deficiency -cont Vit D suppelment   Over 30 minutes of exam, counseling, chart review, and critical decision making was performed  Future Appointments Date  Time Provider Department Center  07/14/2016 8:15 AM Helane Gunther, DPM [facility] None  08/04/2016 9:45 AM [facility] DEVICE REMOTES [facility]  09/10/2016 9:30 AM Lucky Cowboy, MD [facility] None  03/28/2017 9:00 AM Lucky Cowboy, MD [facility] None    Plan:   During the course of the visit the patient was educated and counseled about appropriate screening and preventive services including:    Pneumococcal vaccine   Influenza vaccine  Td vaccine  Prevnar 13  Screening electrocardiogram  Screening mammography  Bone densitometry screening  Colorectal cancer screening  Diabetes screening  Glaucoma screening  Nutrition counseling   Advanced directives: given info/requested copies   Subjective:   Tracey Todd is a 81 y.o. female who presents for Medicare Annual Wellness Visit and 3 month follow up on hypertension, prediabetes, hyperlipidemia, vitamin D def.   Her blood pressure has been controlled at home, today their BP is BP: 122/64 She does workout. She denies chest pain, shortness of breath, dizziness.  She is on cholesterol medication and denies myalgias. Her cholesterol is at goal. The cholesterol last visit was:   Lab Results  Component Value Date   CHOL 173 02/25/2016   HDL 76 02/25/2016   LDLCALC 76 02/25/2016   TRIG 104 02/25/2016   CHOLHDL 2.3 02/25/2016   She reports that her diabetes has been doing well.  Most of the time fasting blood sugars are 113-125.  She reports that she working on her diet and also taking her metformin.  Lab Results  Component Value Date   HGBA1C 6.8 (H) 02/25/2016    Last GFR Lab Results  Component Value Date   [facility] 69 02/25/2016   Lab Results  Component Value Date   GFRAA  79 02/25/2016   Patient is on Vitamin D supplement. Lab Results  Component Value Date   VD25OH 66 02/25/2016     She reports that she does have occasional issues with her arthritis and exercising secondary to pain.  She  notes that she is trying walk as much as possible.    Medication Review Current Outpatient Prescriptions on File Prior to Visit  Medication Sig Dispense Refill  . Acetaminophen (ARTHRITIS PAIN RELIEVER PO) Take 2 capsules by mouth daily as needed (arthritis).     Marland Kitchen. aspirin 81 MG tablet Take 81 mg by mouth daily.    Marland Kitchen. atorvastatin (LIPITOR) 80 MG tablet Take 1/2 to 1 tablet daily or as directed for Cholesterol 30 tablet 1  . calcium carbonate (OS-CAL) 600 MG TABS tablet Take 600 mg by mouth daily with breakfast.    . Cetirizine HCl (ZYRTEC ALLERGY PO) Take 1 tablet by mouth daily.    . Cholecalciferol (VITAMIN D) 2000 UNITS CAPS Take 6,000 Units by mouth daily.     . hydrochlorothiazide (HYDRODIURIL) 25 MG tablet Take 1 tablet (25 mg total) by mouth daily. 90 tablet 1  . Lancets (FREESTYLE) lancets USE 1 LANCET TO CHECK GLUCOSE ONCE DAILY DX E11.21 100 each 0  . Magnesium 500 MG TABS Take 500 mg by mouth 4 (four) times daily.     . metFORMIN (GLUCOPHAGE-XR) 500 MG 24 hr tablet Take 1,000 mg by mouth 2 (two) times daily.  0   No current facility-administered medications on file prior to visit.     Allergies: Allergies  Allergen Reactions  . Penicillins Anaphylaxis    Has patient had a PCN reaction causing immediate rash, facial/tongue/throat swelling, SOB or lightheadedness with hypotension: Yes Has patient had a PCN reaction causing severe rash involving mucus membranes or skin necrosis: No Has patient had a PCN reaction that required hospitalization No Has patient had a PCN reaction occurring within the last 10 years: No If all of the above answers are "NO", then may proceed with Cephalosporin use.   Gunnar Bulla. Feldene [Piroxicam] Other (See Comments)    Bleeding   . Calcium-Containing Compounds Other (See Comments)    "calcium deposits on skin"  . Celebrex [Celecoxib] Itching  . 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  Screening Tests Immunization History  Administered Date(s) Administered  . Influenza Split 05/23/2013  . Influenza, High Dose Seasonal PF 05/30/2014, 05/19/2015, 06/02/2016  . Pneumococcal Conjugate-13 08/06/2015  . Pneumococcal Polysaccharide-23 07/29/2013  . Td 08/31/2011    Preventative care: Last colonoscopy: 2016 Last mammogram: 2016 DEXA:2015  Prior vaccinations: TD or Tdap: 2013  Influenza: 2017  Pneumococcal: 2014 Prevnar13: 2016 Shingles/Zostavax: declined  Names of Other Physician/Practitioners you currently use: 1. [facility]- here for primary care 2. Dr. Elmer PickerHecker, eye doctor, last visit appointment scheduled in October 3. Does not see a dentist, last visit  Patient Care Team: Lucky CowboyWilliam McKeown, MD as PCP - General (Internal Medicine) Mateo FlowKathryn Hecker, MD as Consulting Physician (Ophthalmology) Lewayne BuntingBrian S Crenshaw, MD as Consulting Physician (Cardiology) Wendall StadePeter C Nishan, MD as  Consulting Physician (Cardiology) Carman Ching, MD as Consulting Physician (Gastroenterology) Marily Lente, NP as Nurse Practitioner (Cardiology)  Surgical: She  has a past surgical history that includes Tonsillectomy and adenoidectomy; Total knee arthroplasty; Breast surgery; Cataract extraction; left and right heart catheterization with coronary angiogram (N/A, 10/23/2014); and permanent pacemaker insertion (N/A, 10/25/2014). Family Her family history includes Cancer in her father and mother;  Diabetes in her mother. Social history  She reports that she quit smoking about 52 years ago. She does not have any smokeless tobacco history on file. She reports that she does not drink alcohol. Her drug history is not on file.  MEDICARE WELLNESS OBJECTIVES: Physical activity: Current Exercise Habits: The patient does not participate in regular exercise at present Cardiac risk factors: Cardiac Risk Factors include: advanced age (>1men, >10 women);diabetes mellitus;dyslipidemia;hypertension;obesity (BMI >30kg/m2) Depression/mood screen:   Depression screen [facility] 2/9 06/02/2016  Decreased Interest 0  Down, Depressed, Hopeless 0  PHQ - 2 Score 0    ADLs:  In your present state of health, do you have any difficulty performing the following activities: 06/02/2016 02/25/2016  Hearing? N N  Vision? N N  Difficulty concentrating or making decisions? N N  Walking or climbing stairs? N N  Dressing or bathing? N N  Doing errands, shopping? N N  Preparing Food and eating ? N -  Using the Toilet? N -  In the past six months, have you accidently leaked urine? N -  Do you have problems with loss of bowel control? N -  Managing your Medications? N -  Managing your Finances? N -  Housekeeping or managing your Housekeeping? N -  Some recent data might be hidden     Cognitive Testing  Alert? Yes  Normal Appearance?Yes  Oriented to person? Yes  Place? Yes   Time? Yes  Recall of three objects?  Yes  Can perform simple calculations? Yes  Displays appropriate judgment?Yes  Can read the correct time from a watch face?Yes  EOL planning: Does patient have an advance directive?: Yes Type of Advance Directive: Healthcare Power of Attorney, Living will Does patient want to make changes to advanced directive?: No - Patient declined Copy of advanced directive(s) in chart?: No - copy requested   Objective:   Today's Vitals   06/02/16 0849  BP: 122/64  Pulse: 60  Resp: 16  Temp: 98.2 F (36.8 C)   TempSrc: Temporal  Weight: 174 lb (78.9 kg)  Height: 4\' 10"  (1.473 m)   Body mass index is 36.37 kg/m.  General appearance: alert, no distress, WD/WN,  female HEENT: normocephalic, sclerae anicteric, TMs pearly, nares patent, no discharge or erythema, pharynx normal Oral cavity: MMM, no lesions Neck: supple, no lymphadenopathy, no thyromegaly, no masses Heart: RRR, normal S1, S2, no murmurs Lungs: CTA bilaterally, no wheezes, rhonchi, or rales Abdomen: +bs, soft, non tender, non distended, no masses, no hepatomegaly, no splenomegaly Musculoskeletal: nontender, no swelling, no obvious deformity Extremities: no edema, no cyanosis, no clubbing Pulses: 2+ symmetric, upper and lower extremities, normal cap refill Neurological: alert, oriented x 3, CN2-12 intact, strength normal upper extremities and lower extremities, sensation normal throughout, DTRs 2+ throughout, no cerebellar signs, gait normal Psychiatric: normal affect, behavior normal, pleasant  Breast: defer Gyn: defer Rectal: defer   Medicare Attestation I have personally reviewed: The patient's medical and social history Their use of alcohol, tobacco or illicit drugs Their current medications and supplements The patient's functional ability including ADLs,fall risks, home safety risks, cognitive, and hearing  and visual impairment Diet and physical activities Evidence for depression or mood disorders  The patient's weight, height, BMI, and visual acuity have been recorded in the chart.  I have made referrals, counseling, and provided education to the patient based on review of the above and I have provided the patient with a written personalized care plan for preventive services.     Terri Piedra, PA-C   06/02/2016

## 2016-06-02 NOTE — Patient Instructions (Signed)
Varicose Veins Varicose veins are veins that have become enlarged and twisted. CAUSES This condition is the result of valves in the veins not working properly. Valves in the veins help return blood from the leg to the heart. When your calf muscles squeeze, the blood moves up your leg then the valves close and this continues until the blood gets back to your heart.  If these valves are damaged, blood flows backwards and backs up into the veins in the leg near the skin OR if your are sitting/standing for a long time without using your calf muscles the blood will back up into the veins in your legs. This causes the veins to become larger. People who are on their feet a lot, sit a lot without walking (like on a plane, at a desk, or in a car), who are pregnant, or who are overweight are more likely to develop varicose veins. SYMPTOMS   Bulging, twisted-appearing, bluish veins, most commonly found on the legs.  Leg pain or a feeling of heaviness. These symptoms may be worse at the end of the day.  Leg swelling.  Skin color changes. DIAGNOSIS  Varicose veins can usually be diagnosed with an exam of your legs by your caregiver. He or she may recommend an ultrasound of your leg veins. TREATMENT  Most varicose veins can be treated at home.However, other treatments are available for people who have persistent symptoms or who want to treat the cosmetic appearance of the varicose veins. But this is only cosmetic and they will return if not properly treated. These include:  Laser treatment of very small varicose veins.  Medicine that is shot (injected) into the vein. This medicine hardens the walls of the vein and closes off the vein. This treatment is called sclerotherapy. Afterwards, you may need to wear clothing or bandages that apply pressure.  Surgery. HOME CARE INSTRUCTIONS   Do not stand or sit in one position for long periods of time. Do not sit with your legs crossed. Rest with your legs raised  during the day.  Your legs have to be higher than your heart so that gravity will force the valves to open, so please really elevate your legs.   Wear elastic stockings or support hose. Do not wear other tight, encircling garments around the legs, pelvis, or waist.  [employer]  has a wide variety of well priced compression stockings. [address], [place] [address] [phone]  OR THERE ARE COPPER INFUSED COMPRESSION SOCKS AT [facility] OR [facility]  Walk as much as possible to increase blood flow.  Raise the foot of your bed at night with 2-inch blocks.  If you get a cut in the skin over the vein and the vein bleeds, lie down with your leg raised and press on it with a clean cloth until the bleeding stops. Then place a bandage (dressing) on the cut. See your caregiver if it continues to bleed or needs stitches. SEEK MEDICAL CARE IF:   The skin around your ankle starts to break down.  You have pain, redness, tenderness, or hard swelling developing in your leg over a vein.  You are uncomfortable due to leg pain. Document Released: 05/26/2005 Document Revised: 11/08/2011 Document Reviewed: 10/12/2010 Exit[employer] Patient Information 2014 [employer].   

## 2016-06-03 LAB — HEMOGLOBIN A1C
Hgb A1c MFr Bld: 6.6 % — ABNORMAL HIGH (ref <5.7)
MEAN PLASMA GLUCOSE: 143 mg/dL

## 2016-06-09 ENCOUNTER — Other Ambulatory Visit: Payer: Self-pay | Admitting: Internal Medicine

## 2016-06-09 DIAGNOSIS — Z1231 Encounter for screening mammogram for malignant neoplasm of breast: Secondary | ICD-10-CM

## 2016-06-14 DIAGNOSIS — E119 Type 2 diabetes mellitus without complications: Secondary | ICD-10-CM | POA: Diagnosis not present

## 2016-06-14 DIAGNOSIS — H40023 Open angle with borderline findings, high risk, bilateral: Secondary | ICD-10-CM | POA: Diagnosis not present

## 2016-06-14 DIAGNOSIS — H35033 Hypertensive retinopathy, bilateral: Secondary | ICD-10-CM | POA: Diagnosis not present

## 2016-06-14 DIAGNOSIS — H04123 Dry eye syndrome of bilateral lacrimal glands: Secondary | ICD-10-CM | POA: Diagnosis not present

## 2016-06-18 ENCOUNTER — Ambulatory Visit
Admission: RE | Admit: 2016-06-18 | Discharge: 2016-06-18 | Disposition: A | Discharge status: Home / Self Care | Payer: Medicare Other | Source: Ambulatory Visit | Attending: Internal Medicine | Admitting: Internal Medicine

## 2016-06-18 DIAGNOSIS — Z1231 Encounter for screening mammogram for malignant neoplasm of breast: Secondary | ICD-10-CM | POA: Diagnosis not present

## 2016-06-23 ENCOUNTER — Other Ambulatory Visit: Payer: Self-pay | Admitting: *Deleted

## 2016-06-23 ENCOUNTER — Other Ambulatory Visit: Payer: Self-pay | Admitting: Internal Medicine

## 2016-06-23 DIAGNOSIS — E1121 Type 2 diabetes mellitus with diabetic nephropathy: Secondary | ICD-10-CM | POA: Diagnosis not present

## 2016-06-23 MED ORDER — GLUCOSE BLOOD VI STRP: ORAL_STRIP | 1 refills | Status: DC

## 2016-06-23 MED ORDER — FREESTYLE LANCETS MISC: 1 refills | Status: DC

## 2016-06-29 ENCOUNTER — Other Ambulatory Visit: Payer: Self-pay | Admitting: *Deleted

## 2016-06-29 MED ORDER — METFORMIN HCL ER 500 MG PO TB24: 1000.0000 mg | ORAL_TABLET | Freq: Two times a day (BID) | ORAL | 2 refills | Status: DC

## 2016-07-14 ENCOUNTER — Ambulatory Visit (INDEPENDENT_AMBULATORY_CARE_PROVIDER_SITE_OTHER): Payer: Medicare Other | Admitting: Podiatry

## 2016-07-14 ENCOUNTER — Encounter: Payer: Self-pay | Admitting: Podiatry

## 2016-07-14 VITALS — Ht <= 58 in | Wt 174.0 lb

## 2016-07-14 DIAGNOSIS — M79676 Pain in unspecified toe(s): Secondary | ICD-10-CM

## 2016-07-14 DIAGNOSIS — E1121 Type 2 diabetes mellitus with diabetic nephropathy: Secondary | ICD-10-CM

## 2016-07-14 DIAGNOSIS — B351 Tinea unguium: Secondary | ICD-10-CM | POA: Diagnosis not present

## 2016-07-14 NOTE — Progress Notes (Signed)
Patient ID: Tracey [name], female   DOB: 07/25/1935, 81 y.o.   MRN: [id number] Complaint:  Visit Type: Patient returns to my office for continued preventative foot care services. Complaint: Patient states" my nails have grown long and thick and become painful to walk and wear shoes" Patient has been diagnosed with DM with neuropathy.. The patient presents for preventative foot care services. No changes to ROS  Podiatric Exam: Vascular: dorsalis pedis and posterior tibial pulses are palpable bilateral. Capillary return is immediate. Temperature gradient is WNL. Skin turgor WNL  Sensorium: Normal Semmes Weinstein monofilament test. Normal tactile sensation bilaterally. Nail Exam: Pt has thick disfigured discolored nails with subungual debris noted bilateral entire nail hallux through fifth toenails Ulcer Exam: There is no evidence of ulcer or pre-ulcerative changes or infection. Orthopedic Exam: Muscle tone and strength are WNL. No limitations in general ROM. No crepitus or effusions noted. Foot type and digits show no abnormalities. Bony prominences are unremarkable. Skin: No Porokeratosis. No infection or ulcers  Diagnosis:  Onychomycosis, , Pain in right toe, pain in left toes  Treatment & Plan Procedures and Treatment: Consent by patient was obtained for treatment procedures. The patient understood the discussion of treatment and procedures well. All questions were answered thoroughly reviewed. Debridement of mycotic and hypertrophic toenails, 1 through 5 bilateral and clearing of subungual debris. No ulceration, no infection noted. Minimal mechanical debridement requested. Return Visit-Office Procedure: Patient instructed to return to the office for a follow up visit 3 months for continued evaluation and treatment.    Helane GuntherGregory Lynnette Todd DPM

## 2016-07-29 ENCOUNTER — Other Ambulatory Visit: Payer: Self-pay | Admitting: Nurse Practitioner

## 2016-08-04 ENCOUNTER — Ambulatory Visit (INDEPENDENT_AMBULATORY_CARE_PROVIDER_SITE_OTHER): Payer: Medicare Other | Admitting: *Deleted

## 2016-08-04 DIAGNOSIS — I495 Sick sinus syndrome: Secondary | ICD-10-CM

## 2016-08-04 NOTE — Progress Notes (Signed)
Remote pacemaker transmission.   

## 2016-08-09 DIAGNOSIS — E1121 Type 2 diabetes mellitus with diabetic nephropathy: Secondary | ICD-10-CM | POA: Diagnosis not present

## 2016-08-11 ENCOUNTER — Encounter: Payer: Self-pay | Admitting: Cardiology

## 2016-08-31 LAB — CUP PACEART REMOTE DEVICE CHECK
Brady Statistic AP VP Percent: 4.5 %
Brady Statistic AP VS Percent: 67 %
Brady Statistic AS VP Percent: 1 %
Brady Statistic AS VS Percent: 28 %
Brady Statistic RV Percent Paced: 4.9 %
Implantable Lead Implant Date: 20160226
Implantable Lead Location: 753860
Implantable Lead Model: 1948
Lead Channel Impedance Value: 510 Ohm
Lead Channel Pacing Threshold Amplitude: 0.75 V
Lead Channel Pacing Threshold Amplitude: 1 V
Lead Channel Pacing Threshold Pulse Width: 0.4 ms
Lead Channel Sensing Intrinsic Amplitude: 7 mV
Lead Channel Setting Pacing Amplitude: 1.75 V
Lead Channel Setting Pacing Amplitude: 2.5 V
Lead Channel Setting Pacing Pulse Width: 0.4 ms
Lead Channel Setting Sensing Sensitivity: 2 mV
MDC IDC LEAD IMPLANT DT: 20160226
MDC IDC LEAD LOCATION: 753859
MDC IDC MSMT BATTERY REMAINING LONGEVITY: 123 mo
MDC IDC MSMT BATTERY REMAINING PERCENTAGE: 95.5 %
MDC IDC MSMT BATTERY VOLTAGE: 3.01 V
MDC IDC MSMT LEADCHNL RA IMPEDANCE VALUE: 490 Ohm
MDC IDC MSMT LEADCHNL RA PACING THRESHOLD PULSEWIDTH: 0.4 ms
MDC IDC MSMT LEADCHNL RA SENSING INTR AMPL: 3.8 mV
MDC IDC PG IMPLANT DT: 20160226
MDC IDC SESS DTM: 20171206070015
MDC IDC STAT BRADY RA PERCENT PACED: 70 %
Pulse Gen Serial Number: 7700661

## 2016-09-03 ENCOUNTER — Encounter: Payer: Self-pay | Admitting: *Deleted

## 2016-09-10 ENCOUNTER — Ambulatory Visit (INDEPENDENT_AMBULATORY_CARE_PROVIDER_SITE_OTHER): Payer: Medicare Other | Admitting: Internal Medicine

## 2016-09-10 ENCOUNTER — Encounter: Payer: Self-pay | Admitting: Internal Medicine

## 2016-09-10 VITALS — BP 128/66 | HR 64 | Temp 97.3°F | Resp 16 | Ht <= 58 in | Wt 176.9 lb

## 2016-09-10 DIAGNOSIS — I10 Essential (primary) hypertension: Secondary | ICD-10-CM | POA: Diagnosis not present

## 2016-09-10 DIAGNOSIS — Z950 Presence of cardiac pacemaker: Secondary | ICD-10-CM | POA: Diagnosis not present

## 2016-09-10 DIAGNOSIS — E559 Vitamin D deficiency, unspecified: Secondary | ICD-10-CM

## 2016-09-10 DIAGNOSIS — E782 Mixed hyperlipidemia: Secondary | ICD-10-CM

## 2016-09-10 DIAGNOSIS — I495 Sick sinus syndrome: Secondary | ICD-10-CM | POA: Diagnosis not present

## 2016-09-10 DIAGNOSIS — Z79899 Other long term (current) drug therapy: Secondary | ICD-10-CM

## 2016-09-10 DIAGNOSIS — E1121 Type 2 diabetes mellitus with diabetic nephropathy: Secondary | ICD-10-CM | POA: Diagnosis not present

## 2016-09-10 LAB — HEPATIC FUNCTION PANEL
ALBUMIN: 4.2 g/dL (ref 3.6–5.1)
ALT: 6 U/L (ref 6–29)
AST: 14 U/L (ref 10–35)
Alkaline Phosphatase: 53 U/L (ref 33–130)
Bilirubin, Direct: 0.1 mg/dL (ref <=0.2)
Indirect Bilirubin: 0.5 mg/dL (ref 0.2–1.2)
TOTAL PROTEIN: 7 g/dL (ref 6.1–8.1)
Total Bilirubin: 0.6 mg/dL (ref 0.2–1.2)

## 2016-09-10 LAB — CBC WITH DIFFERENTIAL/PLATELET
BASOS ABS: 61 {cells}/uL (ref 0–200)
Basophils Relative: 1 %
EOS ABS: 183 {cells}/uL (ref 15–500)
Eosinophils Relative: 3 %
HCT: 37.8 % (ref 35.0–45.0)
HEMOGLOBIN: 12.4 g/dL (ref 11.7–15.5)
LYMPHS ABS: 1830 {cells}/uL (ref 850–3900)
Lymphocytes Relative: 30 %
MCH: 28.2 pg (ref 27.0–33.0)
MCHC: 32.8 g/dL (ref 32.0–36.0)
MCV: 86.1 fL (ref 80.0–100.0)
MPV: 9.4 fL (ref 7.5–12.5)
Monocytes Absolute: 610 cells/uL (ref 200–950)
Monocytes Relative: 10 %
NEUTROS ABS: 3416 {cells}/uL (ref 1500–7800)
NEUTROS PCT: 56 %
Platelets: 297 10*3/uL (ref 140–400)
RBC: 4.39 MIL/uL (ref 3.80–5.10)
RDW: 15.6 % — ABNORMAL HIGH (ref 11.0–15.0)
WBC: 6.1 10*3/uL (ref 3.8–10.8)

## 2016-09-10 LAB — TSH: TSH: 2.36 m[IU]/L

## 2016-09-10 LAB — HEMOGLOBIN A1C
HEMOGLOBIN A1C: 6.6 % — AB (ref <5.7)
MEAN PLASMA GLUCOSE: 143 mg/dL

## 2016-09-10 LAB — LIPID PANEL
CHOL/HDL RATIO: 2.5 ratio (ref <5.0)
Cholesterol: 194 mg/dL (ref <200)
HDL: 78 mg/dL (ref >50)
LDL Cholesterol: 103 mg/dL — ABNORMAL HIGH (ref <100)
Triglycerides: 66 mg/dL (ref <150)
VLDL: 13 mg/dL (ref <30)

## 2016-09-10 LAB — BASIC METABOLIC PANEL WITH GFR
BUN: 19 mg/dL (ref 7–25)
CHLORIDE: 102 mmol/L (ref 98–110)
CO2: 28 mmol/L (ref 20–31)
CREATININE: 0.7 mg/dL (ref 0.60–0.93)
Calcium: 10 mg/dL (ref 8.6–10.4)
GFR, Est African American: >89 mL/min (ref >=60)
GFR, Est Non African American: 83 mL/min (ref >=60)
GLUCOSE: 125 mg/dL — AB (ref 65–99)
Potassium: 4.2 mmol/L (ref 3.5–5.3)
Sodium: 137 mmol/L (ref 135–146)

## 2016-09-10 LAB — MAGNESIUM: MAGNESIUM: 1.9 mg/dL (ref 1.5–2.5)

## 2016-09-10 NOTE — Progress Notes (Signed)
[facility] Lucky Cowboy, M.D.        Dyanne Carrel. Steffanie Dunn, P.A.-C       Terri Piedra, P.A.-C  [facility]                [address]                [place], [place]. [address] Telephone [phone] Telefax [phone] ______________________________________________________________________     This very nice 82 y.o. WBF presents for 3 month follow up with Hypertension, ASHD/SSS, Hyperlipidemia, Pre-Diabetes and Vitamin D Deficiency.      Patient is treated for HTN since the 1990's & BP has been controlled at home. Today's BP is at goal - 128/66.  Patient has hx/o SSS and had PPM implanted in Feb 2016.  Surprisingly, Heart Cath found no significant CAD.  Patient has had no complaints of any cardiac type chest pain, palpitations, dyspnea/orthopnea/PND, dizziness, claudication, or dependent edema.     Hyperlipidemia is controlled with diet & meds. Patient denies myalgias or other med SE's. Last Lipids were at goal: Lab Results  Component Value Date   CHOL 146 06/02/2016   HDL 66 06/02/2016   LDLCALC 66 06/02/2016   TRIG 68 06/02/2016   CHOLHDL 2.2 06/02/2016      Also, the patient has history of Morbid Obesity (BMI 37) and consequent T2_NIDDM circa 1997 and has had no symptoms of reactive hypoglycemia, diabetic polys, paresthesias or visual blurring. She reports recent fasting CBG's have been less tan 120 mg%.  Last A1c was not at goal:  Lab Results  Component Value Date   HGBA1C 6.6 (H) 06/02/2016      Further, the patient also has history of Vitamin D Deficiency and supplements vitamin D without any suspected side-effects. Last vitamin D was at goal: Lab Results  Component Value Date   VD25OH 66 02/25/2016   Current Outpatient Prescriptions on File Prior to Visit  Medication Sig  . Acetaminophen Take 2 capsules by mouth daily as needed (arthritis).   Marland Kitchen amLODipine 10 MG  Take 1 tablet (10 mg total) by mouth  daily.  Marland Kitchen aspirin 81 MG  Take 81 mg by mouth daily.  Marland Kitchen atorvastatin  80 MG  Take 1/2 to 1 tablet daily or as directed for Cholesterol  . OS-CAL 600 MG  Take 600 mg by mouth daily with breakfast.  . ZYRTEC Take 1 tablet by mouth daily.  Marland Kitchen VITAMIN D 2000 UNITS Take 6,000 Units by mouth daily.   . hctz 25 MG  TAKE ONE TABLET BY MOUTH ONCE DAILY  . Magnesium 500 MG  Take 500 mg by mouth 4 (four) times daily.   . metFORMIN-XR) 500 MG  Take 2 tablets (1,000 mg total) by mouth 2 (two) times daily.  Marland Kitchen K-DUR 20 MEQ Take 1 tablet (20 mEq total) by mouth daily.   Allergies  Allergen Reactions  . Penicillins Anaphylaxis  . Feldene [Piroxicam] Other (See Comments)    Bleeding   . Calcium-Containing Compounds Other (See Comments)    "calcium deposits on skin"  . Celebrex [Celecoxib] Itching  . Daypro [Oxaprozin] Itching   PMHx:   Past Medical History:  Diagnosis Date  . Acute CHF (HCC) 10/21/14   secondary to bradycardia  . DJD (degenerative joint disease)   . Fatty liver   . Heel pain    left foot  . HTN (hypertension)   . Hypercholesteremia   . Nodule of chest wall   .  Normal coronary arteries 2/10/03/14  . Type II or unspecified type diabetes mellitus without mention of complication, not stated as uncontrolled   . Vitamin D deficiency    Immunization History  Administered Date(s) Administered  . Influenza Split 05/23/2013  . Influenza, High Dose Seasonal PF 05/30/2014, 05/19/2015, 06/02/2016  . Pneumococcal Conjugate-13 08/06/2015  . Pneumococcal Polysaccharide-23 07/29/2013  . Td 08/31/2011   Past Surgical History:  Procedure Laterality Date  . BREAST SURGERY    . CATARACT EXTRACTION    . LEFT AND RIGHT HEART CATHETERIZATION WITH CORONARY ANGIOGRAM N/A 10/23/2014   Procedure: LEFT AND RIGHT HEART CATHETERIZATION WITH CORONARY ANGIOGRAM;  Surgeon: Micheline Chapman, MD;  Location: [facility] CATH LAB;  Service: Cardiovascular;  Laterality: N/A;  . PERMANENT PACEMAKER INSERTION N/A  10/25/2014   Procedure: PERMANENT PACEMAKER INSERTION;  Surgeon: Hillis Range, MD;  Location: [facility] CATH LAB;  Service: Cardiovascular;  Laterality: N/A;  . TONSILLECTOMY AND ADENOIDECTOMY    . TOTAL KNEE ARTHROPLASTY     FHx:    Reviewed / unchanged  SHx:    Reviewed / unchanged  Systems Review:  Constitutional: Denies fever, chills, wt changes, headaches, insomnia, fatigue, night sweats, change in appetite. Eyes: Denies redness, blurred vision, diplopia, discharge, itchy, watery eyes.  ENT: Denies discharge, congestion, post nasal drip, epistaxis, sore throat, earache, hearing loss, dental pain, tinnitus, vertigo, sinus pain, snoring.  CV: Denies chest pain, palpitations, irregular heartbeat, syncope, dyspnea, diaphoresis, orthopnea, PND, claudication or edema. Respiratory: denies cough, dyspnea, DOE, pleurisy, hoarseness, laryngitis, wheezing.  Gastrointestinal: Denies dysphagia, odynophagia, heartburn, reflux, water brash, abdominal pain or cramps, nausea, vomiting, bloating, diarrhea, constipation, hematemesis, melena, hematochezia  or hemorrhoids. Genitourinary: Denies dysuria, frequency, urgency, nocturia, hesitancy, discharge, hematuria or flank pain. Musculoskeletal: Denies arthralgias, myalgias, stiffness, jt. swelling, pain, limping or strain/sprain.  Skin: Denies pruritus, rash, hives, warts, acne, eczema or change in skin lesion(s). Neuro: No weakness, tremor, incoordination, spasms, paresthesia or pain. Psychiatric: Denies confusion, memory loss or sensory loss. Endo: Denies change in weight, skin or hair change.  Heme/Lymph: No excessive bleeding, bruising or enlarged lymph nodes.  Physical Exam  BP 128/66   Pulse 64   Temp 97.3 F (36.3 C)   Resp 16   Ht 4\' 10"  (1.473 m)   Wt 176 lb 14.4 oz (80.2 kg)   BMI 36.97 kg/m   Appears over nourished and in no distress.  Eyes: PERRLA, EOMs, conjunctiva no swelling or erythema. Sinuses: No frontal/maxillary  tenderness ENT/Mouth: EAC's clear, TM's nl w/o erythema, bulging. Nares clear w/o erythema, swelling, exudates. Oropharynx clear without erythema or exudates. Oral hygiene is good. Tongue normal, non obstructing. Hearing intact.  Neck: Supple. Thyroid nl. Car 2+/2+ without bruits, nodes or JVD. Chest: Respirations nl with BS clear & equal w/o rales, rhonchi, wheezing or stridor.  Cor: Heart sounds normal w/ regular rate and rhythm without sig. murmurs, gallops, clicks, or rubs. Peripheral pulses normal and equal  without edema.  Abdomen: Soft & bowel sounds normal. Non-tender w/o guarding, rebound, hernias, masses, or organomegaly.  Lymphatics: Unremarkable.  Musculoskeletal: Full ROM all peripheral extremities, joint stability, 5/5 strength, and normal gait.  Skin: Warm, dry without exposed rashes, lesions or ecchymosis apparent.  Neuro: Cranial nerves intact, reflexes equal bilaterally. Sensory-motor testing grossly intact. Tendon reflexes grossly intact.  Pysch: Alert & oriented x 3.  Insight and judgement nl & appropriate. No ideations.  Assessment and Plan:  1. Essential hypertension  - Continue medication, monitor blood pressure at home.  - Continue DASH  diet. Reminder to go to the ER if any CP,  SOB, nausea, dizziness, severe HA, changes vision/speech,  left arm numbness and tingling and jaw pain. - CBC with Differential/Platelet - BASIC METABOLIC PANEL WITH GFR - TSH  2. Hyperlipidemia  - Continue diet/meds, exercise,& lifestyle modifications.  - Continue monitor periodic cholesterol/liver & renal functions  - Hepatic function panel - Lipid panel - TSH  3. 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  - Monitor appropriate labs. - Hemoglobin A1c - Insulin, random  4. Vitamin D deficiency  - Continue supplementation. - VITAMIN D 25 Hydroxy  5. Sick sinus syndrome (HCC)   6. Pacemaker implanted 10/25/14- [facility]   7. Medication management  - CBC with Differential/Platelet - BASIC METABOLIC PANEL WITH GFR - Hepatic function panel - Magnesium       Recommended regular exercise, BP monitoring, weight control, and discussed med and SE's. Recommended labs to assess and monitor clinical status. Further disposition pending results of labs. Over 30 minutes of exam, counseling, chart review was performed

## 2016-09-10 NOTE — Patient Instructions (Signed)

## 2016-09-11 LAB — INSULIN, RANDOM: Insulin: 25.1 u[IU]/mL — ABNORMAL HIGH (ref 2.0–19.6)

## 2016-09-11 LAB — VITAMIN D 25 HYDROXY (VIT D DEFICIENCY, FRACTURES): VIT D 25 HYDROXY: 67 ng/mL (ref 30–100)

## 2016-09-27 DIAGNOSIS — E1121 Type 2 diabetes mellitus with diabetic nephropathy: Secondary | ICD-10-CM | POA: Diagnosis not present

## 2016-10-06 ENCOUNTER — Encounter: Payer: Self-pay | Admitting: Podiatry

## 2016-10-06 ENCOUNTER — Ambulatory Visit (INDEPENDENT_AMBULATORY_CARE_PROVIDER_SITE_OTHER): Payer: Medicare Other | Admitting: Podiatry

## 2016-10-06 VITALS — Ht <= 58 in | Wt 176.0 lb

## 2016-10-06 DIAGNOSIS — M79676 Pain in unspecified toe(s): Secondary | ICD-10-CM | POA: Diagnosis not present

## 2016-10-06 DIAGNOSIS — M2010 Hallux valgus (acquired), unspecified foot: Secondary | ICD-10-CM

## 2016-10-06 DIAGNOSIS — B351 Tinea unguium: Secondary | ICD-10-CM | POA: Diagnosis not present

## 2016-10-06 DIAGNOSIS — E1121 Type 2 diabetes mellitus with diabetic nephropathy: Secondary | ICD-10-CM

## 2016-10-06 NOTE — Progress Notes (Signed)
Patient ID: Marvene StaffMary C Beining, female   DOB: 07/22/1935, 82 y.o.   MRN: [id number] Complaint:  Visit Type: Patient returns to my office for continued preventative foot care services. Complaint: Patient states" my nails have grown long and thick and become painful to walk and wear shoes" Patient has been diagnosed with DM with neuropathy.. The patient presents for preventative foot care services. No changes to ROS  Podiatric Exam: Vascular: dorsalis pedis and posterior tibial pulses are palpable bilateral. Capillary return is immediate. Temperature gradient is WNL. Skin turgor WNL  Sensorium: Normal Semmes Weinstein monofilament test. Normal tactile sensation bilaterally. Nail Exam: Pt has thick disfigured discolored nails with subungual debris noted bilateral entire nail hallux through fifth toenails Ulcer Exam: There is no evidence of ulcer or pre-ulcerative changes or infection. Orthopedic Exam: Muscle tone and strength are WNL. No limitations in general ROM. No crepitus or effusions noted. Foot type and digits show no abnormalities. Mild dorsomedial exostosis noted. Skin: No Porokeratosis. No infection or ulcers  Diagnosis:  Onychomycosis, , Pain in right toe, pain in left toes  Treatment & Plan Procedures and Treatment: Consent by patient was obtained for treatment procedures. The patient understood the discussion of treatment and procedures well. All questions were answered thoroughly reviewed. Debridement of mycotic and hypertrophic toenails, 1 through 5 bilateral and clearing of subungual debris. No ulceration, no infection noted. Minimal mechanical debridement requested. Return Visit-Office Procedure: Patient instructed to return to the office for a follow up visit 3 months for continued evaluation and treatment.    Helane GuntherGregory Guilford Shannahan DPM

## 2016-10-07 ENCOUNTER — Other Ambulatory Visit: Payer: Self-pay | Admitting: Internal Medicine

## 2016-10-08 DIAGNOSIS — E1121 Type 2 diabetes mellitus with diabetic nephropathy: Secondary | ICD-10-CM | POA: Diagnosis not present

## 2016-11-03 ENCOUNTER — Telehealth: Payer: Self-pay | Admitting: Cardiology

## 2016-11-03 ENCOUNTER — Ambulatory Visit (INDEPENDENT_AMBULATORY_CARE_PROVIDER_SITE_OTHER): Payer: Medicare Other | Admitting: *Deleted

## 2016-11-03 DIAGNOSIS — I495 Sick sinus syndrome: Secondary | ICD-10-CM | POA: Diagnosis not present

## 2016-11-03 NOTE — Progress Notes (Signed)
Remote pacemaker transmission.   

## 2016-11-03 NOTE — Telephone Encounter (Signed)
Spoke with pt and reminded pt of remote transmission that is due today. Pt verbalized understanding.   

## 2016-11-04 ENCOUNTER — Encounter: Payer: Self-pay | Admitting: Cardiology

## 2016-11-05 LAB — CUP PACEART REMOTE DEVICE CHECK
Battery Remaining Longevity: 122 mo
Battery Voltage: 3.01 V
Brady Statistic AS VP Percent: 1 %
Brady Statistic RA Percent Paced: 72 %
Date Time Interrogation Session: 20180307161951
Implantable Lead Implant Date: 20160226
Implantable Lead Location: 753859
Implantable Lead Model: 1948
Implantable Pulse Generator Implant Date: 20160226
Lead Channel Impedance Value: 460 Ohm
Lead Channel Impedance Value: 480 Ohm
Lead Channel Pacing Threshold Amplitude: 0.75 V
Lead Channel Pacing Threshold Pulse Width: 0.4 ms
Lead Channel Sensing Intrinsic Amplitude: 6.9 mV
Lead Channel Setting Pacing Amplitude: 1.75 V
MDC IDC LEAD IMPLANT DT: 20160226
MDC IDC LEAD LOCATION: 753860
MDC IDC MSMT BATTERY REMAINING PERCENTAGE: 95.5 %
MDC IDC MSMT LEADCHNL RA SENSING INTR AMPL: 3.4 mV
MDC IDC MSMT LEADCHNL RV PACING THRESHOLD AMPLITUDE: 1 V
MDC IDC MSMT LEADCHNL RV PACING THRESHOLD PULSEWIDTH: 0.4 ms
MDC IDC SET LEADCHNL RV PACING AMPLITUDE: 2.5 V
MDC IDC SET LEADCHNL RV PACING PULSEWIDTH: 0.4 ms
MDC IDC SET LEADCHNL RV SENSING SENSITIVITY: 2 mV
MDC IDC STAT BRADY AP VP PERCENT: 5.2 %
MDC IDC STAT BRADY AP VS PERCENT: 68 %
MDC IDC STAT BRADY AS VS PERCENT: 27 %
MDC IDC STAT BRADY RV PERCENT PACED: 5.5 %
Pulse Gen Model: 2240
Pulse Gen Serial Number: 7700661

## 2016-11-17 DIAGNOSIS — E1121 Type 2 diabetes mellitus with diabetic nephropathy: Secondary | ICD-10-CM | POA: Diagnosis not present

## 2016-12-08 IMAGING — CR DG MYELOGRAPHY LUMBAR INJ LUMBOSACRAL
3 series · 3 of 3 positions shown · non-contrast
Comparison: CT of the abdomen and pelvis 12/07/2009

CLINICAL DATA: Low back pain extending into the right lower
extremity.
TECHNIQUE: Contiguous axial images were obtained through the Lumbar spine after
the intrathecal infusion of infusion. Coronal and sagittal
reconstructions were obtained of the axial image sets.

[w lumbar spine lat]
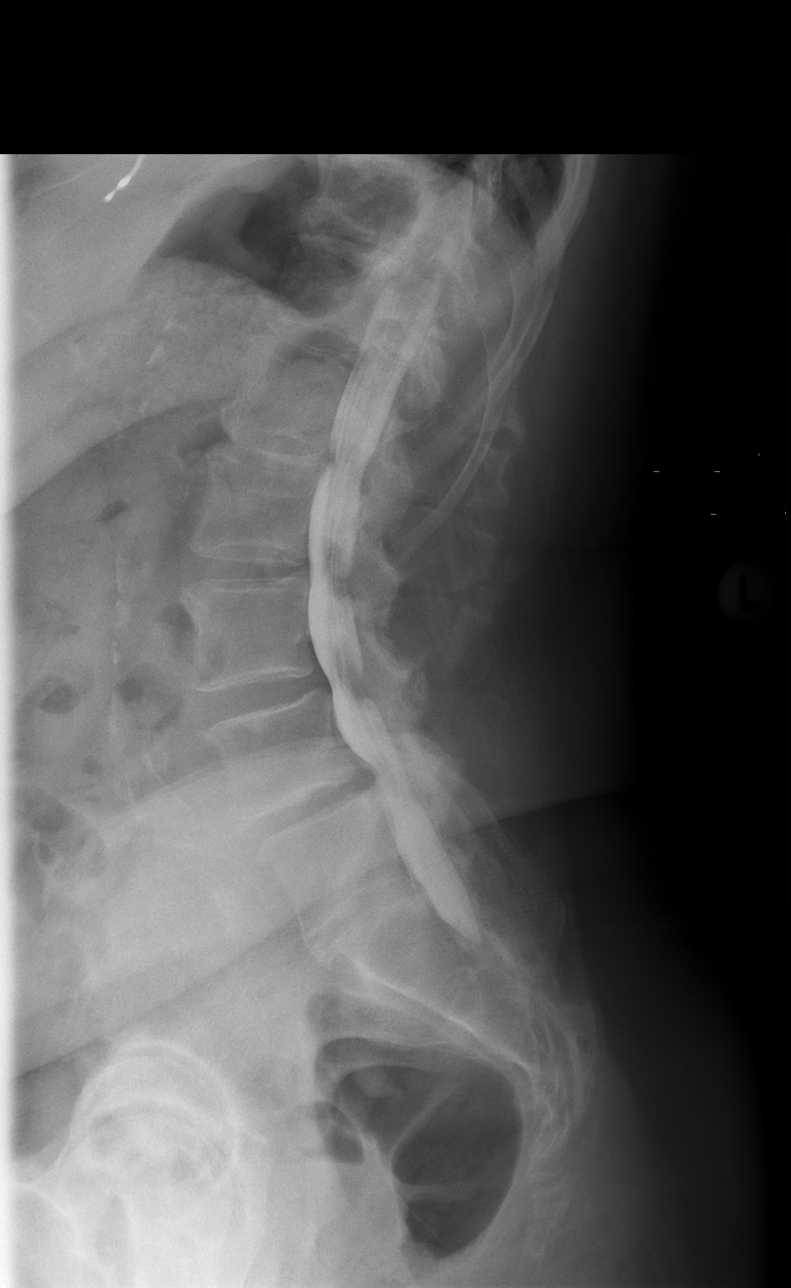

[w lumbar spine flexion]
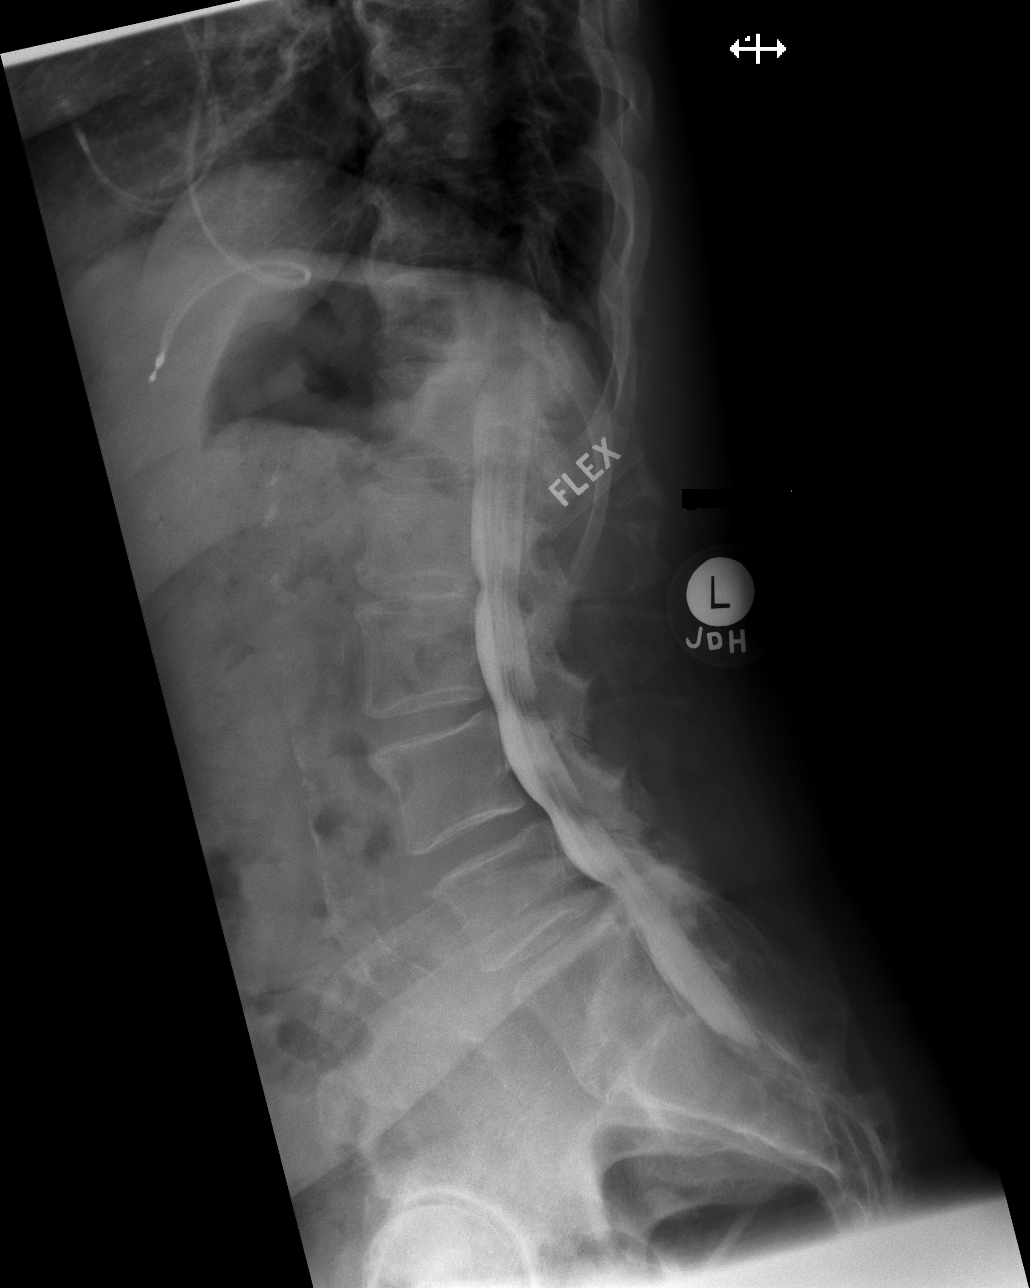

[w lumbar spine extension]
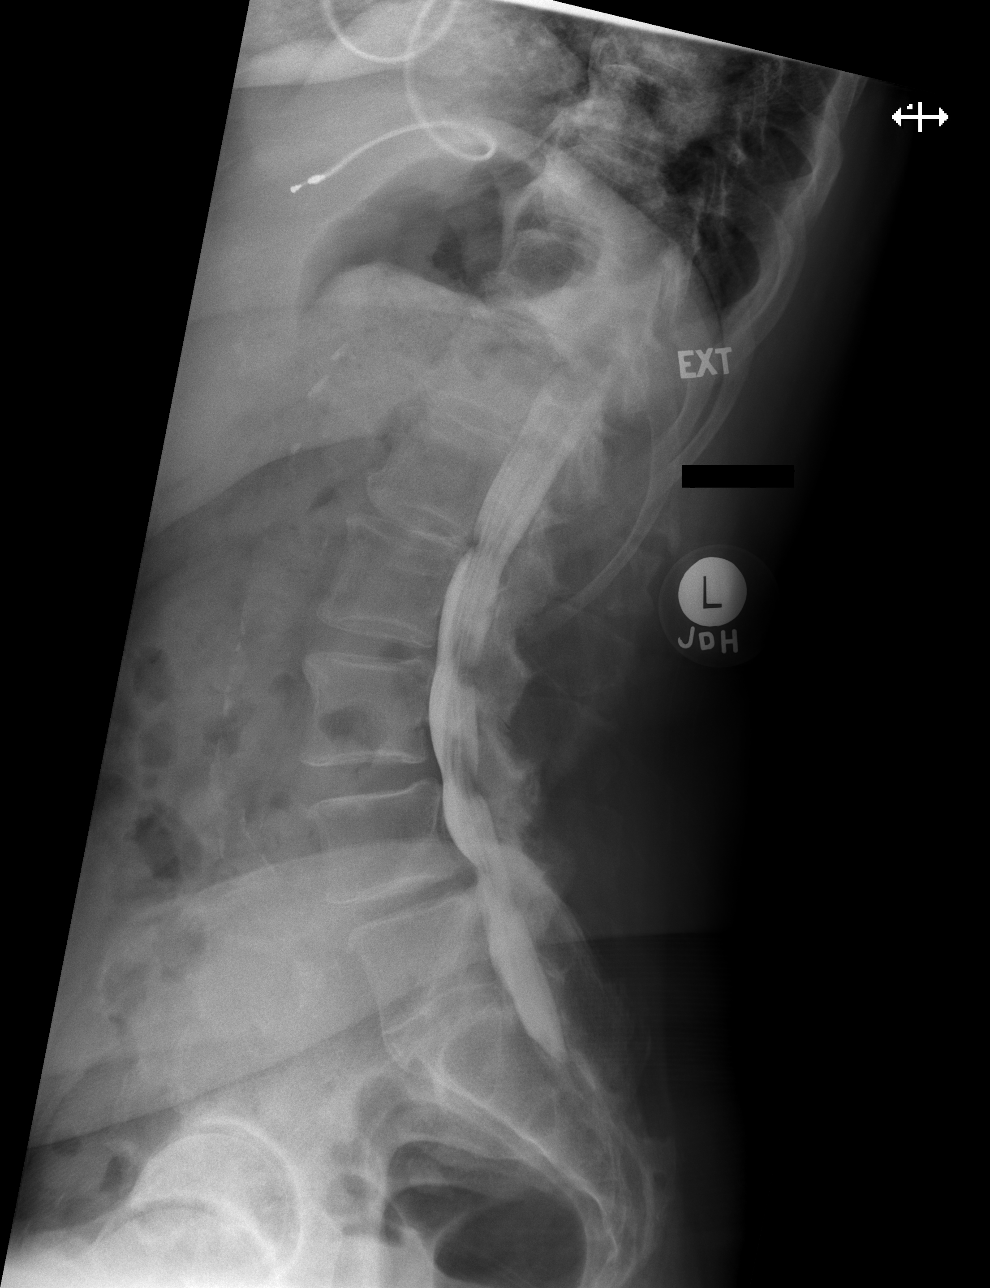

[3 of 3 positions shown; findings below may reference images not displayed]

EXAM:
LUMBAR MYELOGRAM

FLUOROSCOPY TIME:  Fluoroscopy Time:  1 minutes 32 seconds

Number of Acquired Images:  14

PROCEDURE:
After thorough discussion of risks and benefits of the procedure
including bleeding, infection, injury to nerves, blood vessels,
adjacent structures as well as headache and CSF leak, written and
oral informed consent was obtained. Consent was obtained by Dr.
Caleb Aujla. Time out form was completed.

Patient was positioned prone on the fluoroscopy table. Local
anesthesia was provided with 1% lidocaine without epinephrine after
prepped and draped in the usual sterile fashion. Puncture was
performed at L2-3 using a 3 1/2 inch 22-gauge spinal needle via left
paramedian approach. Using a single pass through the dura, the
needle was placed within the thecal sac, with return of clear CSF.
15 mL of Isovue-M 200 was injected into the thecal sac, with normal
opacification of the nerve roots and cauda equina consistent with
free flow within the subarachnoid space.

I personally performed the lumbar puncture and administered the
intrathecal contrast. I also personally supervised acquisition of
the myelogram images.
FINDINGS: LUMBAR MYELOGRAM FINDINGS:

Transitional T12 and L5 anatomy is present. A broad-based disc
protrusion is present with mild subarticular narrowing bilaterally
at L1-2. Mild subarticular narrowing is worse on the right at L2-3.
Slight anterolisthesis uncovering of a a broad-based disc protrusion
is present at L3-4. Mild subarticular narrowing is worse on the left
at L3-4.

Moderate central canal stenosis with bilateral subarticular disease
is present at L4-5. There is also early truncation of the L4 nerve
roots at this level. No significant focal stenosis is evident at
L5-S1.

Anterolisthesis at L4-5 does not change significantly with standing
but is exacerbated with flexion and partially reduced in extension.
Mild L3-4 anterolisthesis is exaggerated in flexion as well.

Other disc disease is stable. A pacemaker is in place.
Atherosclerotic calcifications are present in the aorta without
aneurysm.

CT LUMBAR MYELOGRAM FINDINGS:

The lumbar spine is imaged from the midbody of T11 through the
midbody of S2. A transitional L5 vertebral body is present.

Atherosclerotic changes are present in the aorta without aneurysm.
Degenerative changes are present at the SI joints bilaterally.
Limited imaging of the abdomen is otherwise unremarkable.

Slight anterolisthesis is present at L3-4 and L4-5. Alignment is
otherwise anatomic. Vertebral body heights are maintained.

T11-12: A shallow central disc protrusion is present without
significant stenosis.

T12-L1:  Negative.

L1-2: A mild broad-based disc protrusion is present. This extends
into both neural foramina without significant stenosis.

L2-3: A broad-based disc protrusion is present. Moderate facet
hypertrophy is worse on the left. The disc extends into the neural
foramina bilaterally with mild foraminal stenosis.

L3-4: A broad-based disc protrusion is asymmetric to the left.
Moderate facet hypertrophy is present bilaterally, left greater than
right. This results in mild left subarticular and bilateral
foraminal narrowing, left greater than right.

L4-5: A rightward disc extrusion narrows the subarticular space.
This extends 10 mm below the superior endplate of L5. Moderate right
and mild left subarticular narrowing is present. Advanced facet
hypertrophy contributes. Mild foraminal narrowing is worse on the
right.

L5-S1: The disc space and posterior elements are fused. No focal
stenosis is present.
IMPRESSION: LUMBAR MYELOGRAM IMPRESSION:

1. Moderate central canal stenosis at L4-5 with dynamic
anterolisthesis, worse on flexion and partially reduced in
extension.
2. Slight anterolisthesis and a broad-based disc protrusion at L3-4
are worse with flexion.
3. Mild subarticular narrowing is present bilaterally at L1-2 and
worse on the right at L2-3.
4. Transitional T12 and L5 anatomy. There is sacralization of the L5
vertebral body.

CT LUMBAR MYELOGRAM IMPRESSION:

1. Right paramedian disc extrusion extends 10 mm below the superior
endplate of L4 with moderate right and mild left subarticular
stenosis.
2. Mild narrowing at L4-5 is worse on the right.
3. Mild left subarticular and bilateral foraminal stenosis at L3-4,
left greater than right, secondary to a leftward disc protrusion and
moderate facet hypertrophy.
4. Broad-based disc protrusion extends into the neural foramina
bilaterally at L1-2 and L2-3 without significant stenosis.
5. Multilevel facet hypertrophy as described.
6. Transitional sacralized L5 segment without significant disease at
L5-S1.

## 2016-12-14 DIAGNOSIS — H04123 Dry eye syndrome of bilateral lacrimal glands: Secondary | ICD-10-CM | POA: Diagnosis not present

## 2016-12-14 DIAGNOSIS — H40023 Open angle with borderline findings, high risk, bilateral: Secondary | ICD-10-CM | POA: Diagnosis not present

## 2016-12-14 NOTE — Progress Notes (Signed)
Patient ID: ZYLEE MARCHIANO, female   DOB: Oct 24, 1934, 82 y.o.   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 -     Hemoglobin A1c  Controlled type 2 diabetes mellitus with diabetic nephropathy, unspecified whether long term insulin use (HCC) Discussed general issues about diabetes pathophysiology and management., Educational material distributed., Suggested low cholesterol diet., Encouraged aerobic exercise., Discussed foot care., Reminded to get yearly retinal exam. -     Hemoglobin A1c  Hyperlipidemia -continue medications, check lipids, decrease fatty foods, increase activity.  -     Lipid panel  Medication management -     Magnesium  Morbid Obesity with co morbidities - long discussion about weight loss, diet, and exercise -     Lipid panel -     Hemoglobin A1c  Other orders -     potassium chloride SA (K-DUR,KLOR-CON) 20 MEQ tablet; Take 1 tablet (20 mEq total) by mouth daily.     Continue diet and meds as discussed. Further disposition pending results of labs. Discussed med's effects and SE's.    Future Appointments Date Time Provider Department Center  12/29/2016 8:15 AM Helane Gunther, DPM [facility]  03/28/2017 9:00 AM Lucky Cowboy, MD [facility] None     HPI 82 y.o. female  presents for 3 month follow up with hypertension, hyperlipidemia, diabetes  and vitamin D deficiency.  She had severe DDD, is following with Dr. Regino Schultze, states she is on gabapentin and she is walking better and has not had many pains.    Her blood pressure has been controlled at home, today their BP is BP: 122/80.She does not workout due to hip pain. She denies chest pain, shortness of breath, dizziness.  She does check her BP at home.   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 [name]. She has moderate PHTN and normal EF 55% 09/2014. Weight is down.    She is on cholesterol medication and denies myalgias. Her cholesterol is at goal. The cholesterol was:  09/10/2016: Cholesterol 194; HDL 78; LDL Cholesterol 103; Triglycerides 66   She has been working on diet and exercise for diabetes with diabetic chronic kidney disease, she is on bASA, she is on ACE/ARB, and denies  foot ulcerations, hyperglycemia, hypoglycemia , increased appetite, nausea, paresthesia of the feet, polydipsia, polyuria, visual disturbances, vomiting and weight loss. Last A1C was: 09/10/2016: Hgb A1c MFr Bld 6.6.    Patient is on Vitamin D supplement. 09/10/2016: Vit D, 25-Hydroxy 67   BMI is Body mass index is 36.2 kg/m., she is working on diet and exercise. Wt Readings from Last 3 Encounters:  12/15/16 173 lb 3.2 oz (78.6 kg)  10/06/16 176 lb (79.8 kg)  09/10/16 176 lb 14.4 oz (80.2 kg)     Current Medications:  Current Outpatient Prescriptions on File Prior to Visit  Medication Sig Dispense Refill  . Acetaminophen (ARTHRITIS PAIN RELIEVER PO) Take 2 capsules by mouth daily as needed (arthritis).     Marland Kitchen  amLODipine (NORVASC) 10 MG tablet Take 1 tablet (10 mg total) by mouth daily. 30 tablet 3  . aspirin 81 MG tablet Take 81 mg by mouth daily.    . calcium carbonate (OS-CAL) 600 MG TABS tablet Take 600 mg by mouth daily with breakfast.    . Cetirizine HCl (ZYRTEC ALLERGY PO) Take 1 tablet by mouth daily.    . Cholecalciferol (VITAMIN D) 2000 UNITS CAPS Take  6,000 Units by mouth daily.     Marland Kitchen glucose blood (FREESTYLE LITE) test strip Check blood sugar 1 time daily. DX-E11.21. 100 each 1  . hydrochlorothiazide (HYDRODIURIL) 25 MG tablet TAKE ONE TABLET BY MOUTH ONCE DAILY 90 tablet 1  . Lancets (FREESTYLE) lancets USE 1 LANCET TO CHECK GLUCOSE ONCE DAILY. DX-E11.21 100 each 1  . Magnesium 500 MG TABS Take 500 mg by mouth 4 (four) times daily.     . metFORMIN (GLUCOPHAGE-XR) 500 MG 24 hr tablet Take 2 tablets (1,000 mg total) by mouth 2 (two) times daily. 360 tablet 2  . potassium chloride SA (K-DUR,KLOR-CON) 20 MEQ tablet TAKE ONE TABLET BY MOUTH ONCE DAILY 90 tablet 0  . atorvastatin (LIPITOR) 80 MG tablet Take 1/2 to 1 tablet daily or as directed for Cholesterol 30 tablet 1   No current facility-administered medications on file prior to visit.    Medical History:  Past Medical History:  Diagnosis Date  . Acute CHF (HCC) 10/21/14   secondary to bradycardia  . DJD (degenerative joint disease)   . Fatty liver   . Heel pain    left foot  . HTN (hypertension)   . Hypercholesteremia   . Nodule of chest wall   . Normal coronary arteries 2/10/03/14  . Type II or unspecified type diabetes mellitus without mention of complication, not stated as uncontrolled   . Vitamin D deficiency    Allergies:  Allergies  Allergen Reactions  . Penicillins Anaphylaxis  . Feldene [Piroxicam] Other (See Comments)    Bleeding   . Calcium-Containing Compounds Other (See Comments)    "calcium deposits on skin"  . Celebrex [Celecoxib] Itching  . Daypro [Oxaprozin] Itching     Review of Systems:  Review of Systems  Constitutional: Negative for chills, fever and malaise/fatigue.  HENT: Positive for ear pain (ear fullness). Negative for congestion, nosebleeds and sore throat.   Eyes: Negative.   Respiratory: Negative for cough, shortness of breath and wheezing.   Cardiovascular: Negative for chest pain, palpitations and leg swelling.  Gastrointestinal: Negative  for blood in stool, constipation, diarrhea, heartburn and melena.  Genitourinary: Negative.   Skin: Negative.   Neurological: Negative for dizziness, sensory change, loss of consciousness and headaches.  Psychiatric/Behavioral: Negative for depression. The patient is not nervous/anxious and does not have insomnia.     Family history- Review and unchanged  Social history- Review and unchanged  Physical Exam: BP 122/80   Pulse 80   Temp 97.6 F (36.4 C)   Resp 14   Ht  (1.473 m)   Wt 173 lb 3.2 oz (78.6 kg)   SpO2 95%   BMI 36.20 kg/m  Wt Readings from Last 3 Encounters:  12/15/16 173 lb 3.2 oz (78.6 kg)  10/06/16 176 lb (79.8 kg)  09/10/16 176 lb 14.4 oz (80.2 kg)   General Appearance: Well nourished well developed, non-toxic appearing, in no apparent distress. Eyes: PERRLA, EOMs, conjunctiva no swelling or erythema ENT/Mouth: Ear canals clear with no erythema, swelling, or discharge.  TMs normal bilaterally, oropharynx  clear, moist, with no exudate.   Neck: Supple, thyroid normal, no JVD, no cervical adenopathy.  Respiratory: Respiratory effort normal, breath sounds clear A&P, no wheeze, rhonchi or rales noted.  No retractions, no accessory muscle usage Cardio: RRR with no MRGs. No noted edema.  Abdomen: Soft, + BS.  Non tender, no guarding, rebound, hernias, masses. Musculoskeletal: Full ROM, 5/5 strength, Normal gait Skin: Warm, dry without rashes, lesions, ecchymosis.  Neuro: Awake and oriented X 3, Cranial nerves intact. No cerebellar symptoms.  Psych: normal affect, Insight and Judgment appropriate.    Quentin Mulling, PA-C 11:35 AM [facility]

## 2016-12-15 ENCOUNTER — Ambulatory Visit: Payer: Self-pay | Admitting: Internal Medicine

## 2016-12-15 ENCOUNTER — Encounter: Payer: Self-pay | Admitting: Physician Assistant

## 2016-12-15 ENCOUNTER — Ambulatory Visit (INDEPENDENT_AMBULATORY_CARE_PROVIDER_SITE_OTHER): Payer: Medicare Other | Admitting: Physician Assistant

## 2016-12-15 VITALS — BP 122/80 | HR 80 | Temp 97.6°F | Resp 14 | Ht <= 58 in | Wt 173.2 lb

## 2016-12-15 DIAGNOSIS — E1121 Type 2 diabetes mellitus with diabetic nephropathy: Secondary | ICD-10-CM

## 2016-12-15 DIAGNOSIS — I10 Essential (primary) hypertension: Secondary | ICD-10-CM | POA: Diagnosis not present

## 2016-12-15 DIAGNOSIS — I480 Paroxysmal atrial fibrillation: Secondary | ICD-10-CM | POA: Diagnosis not present

## 2016-12-15 DIAGNOSIS — I495 Sick sinus syndrome: Secondary | ICD-10-CM

## 2016-12-15 DIAGNOSIS — Z79899 Other long term (current) drug therapy: Secondary | ICD-10-CM | POA: Diagnosis not present

## 2016-12-15 DIAGNOSIS — I509 Heart failure, unspecified: Secondary | ICD-10-CM

## 2016-12-15 DIAGNOSIS — E782 Mixed hyperlipidemia: Secondary | ICD-10-CM

## 2016-12-15 DIAGNOSIS — I272 Pulmonary hypertension, unspecified: Secondary | ICD-10-CM | POA: Diagnosis not present

## 2016-12-15 LAB — CBC WITH DIFFERENTIAL/PLATELET
Basophils Absolute: 0 cells/uL (ref 0–200)
Basophils Relative: 0 %
EOS ABS: 144 {cells}/uL (ref 15–500)
Eosinophils Relative: 2 %
HCT: 38.1 % (ref 35.0–45.0)
Hemoglobin: 12 g/dL (ref 11.7–15.5)
LYMPHS PCT: 32 %
Lymphs Abs: 2304 cells/uL (ref 850–3900)
MCH: 27 pg (ref 27.0–33.0)
MCHC: 31.5 g/dL — AB (ref 32.0–36.0)
MCV: 85.6 fL (ref 80.0–100.0)
MONO ABS: 648 {cells}/uL (ref 200–950)
MONOS PCT: 9 %
MPV: 9.7 fL (ref 7.5–12.5)
NEUTROS PCT: 57 %
Neutro Abs: 4104 cells/uL (ref 1500–7800)
Platelets: 297 10*3/uL (ref 140–400)
RBC: 4.45 MIL/uL (ref 3.80–5.10)
RDW: 15.9 % — AB (ref 11.0–15.0)
WBC: 7.2 10*3/uL (ref 3.8–10.8)

## 2016-12-15 LAB — BASIC METABOLIC PANEL WITH GFR
BUN: 20 mg/dL (ref 7–25)
CO2: 26 mmol/L (ref 20–31)
Calcium: 10.1 mg/dL (ref 8.6–10.4)
Chloride: 101 mmol/L (ref 98–110)
Creat: 0.72 mg/dL (ref 0.60–0.93)
GFR, Est Non African American: 80 mL/min (ref >=60)
GLUCOSE: 78 mg/dL (ref 65–99)
Potassium: 4.3 mmol/L (ref 3.5–5.3)
Sodium: 139 mmol/L (ref 135–146)

## 2016-12-15 LAB — LIPID PANEL
CHOLESTEROL: 155 mg/dL (ref <200)
HDL: 77 mg/dL (ref >50)
LDL Cholesterol: 56 mg/dL (ref <100)
Total CHOL/HDL Ratio: 2 Ratio (ref <5.0)
Triglycerides: 109 mg/dL (ref <150)
VLDL: 22 mg/dL (ref <30)

## 2016-12-15 LAB — HEPATIC FUNCTION PANEL
ALT: 6 U/L (ref 6–29)
AST: 14 U/L (ref 10–35)
Albumin: 4.3 g/dL (ref 3.6–5.1)
Alkaline Phosphatase: 59 U/L (ref 33–130)
BILIRUBIN INDIRECT: 0.4 mg/dL (ref 0.2–1.2)
Bilirubin, Direct: 0.1 mg/dL (ref <=0.2)
TOTAL PROTEIN: 6.8 g/dL (ref 6.1–8.1)
Total Bilirubin: 0.5 mg/dL (ref 0.2–1.2)

## 2016-12-15 LAB — TSH: TSH: 2 m[IU]/L

## 2016-12-15 MED ORDER — POTASSIUM CHLORIDE CRYS ER 20 MEQ PO TBCR: 20.0000 meq | EXTENDED_RELEASE_TABLET | Freq: Every day | ORAL | 1 refills | Status: DC

## 2016-12-15 NOTE — Patient Instructions (Signed)
Your ears and sinuses are connected by the eustachian tube. When your sinuses are inflamed, this can close off the tube and cause fluid to collect in your middle ear. This can then cause dizziness, popping, clicking, ringing, and echoing in your ears. This is often NOT an infection and does NOT require antibiotics, it is caused by inflammation so the treatments help the inflammation. This can take a long time to get better so please be patient.  Here are things you can do to help with this: - Try the Flonase or Nasonex. Remember to spray each nostril twice towards the outer part of your eye.  Do not sniff but instead pinch your nose and tilt your head back to help the medicine get into your sinuses.  The best time to do this is at bedtime.Stop if you get blurred vision or nose bleeds.  -While drinking fluids, pinch and hold nose close and swallow, to help open eustachian tubes to drain fluid behind ear drums. -Please pick one of the over the counter allergy medications below and take it once daily for allergies.  It will also help with fluid behind ear drums. Claritin or loratadine cheapest but likely the weakest  Zyrtec or certizine at night because it can make you sleepy The strongest is allegra or fexafinadine  Cheapest at [employer], [employer], [employer] -can use decongestant over the counter, please do not use if you have high blood pressure or certain heart conditions.   if worsening HA, changes vision/speech, imbalance, weakness go to the ER    Your A1C is a measure of your sugar over the past 3 months and is not affected by what you have eaten over the past few days. Diabetes increases your chances of stroke and heart attack over 300 % and is the leading cause of blindness and kidney failure in the [place]. Please make sure you decrease bad carbs like white bread, white rice, potatoes, corn, soft drinks, pasta, cereals, refined sugars, sweet tea, dried fruits, and fruit juice. Good carbs are okay  to eat in moderation like sweet potatoes, brown rice, whole grain pasta/bread, most fruit (except dried fruit) and you can eat as many veggies as you want.   Greater than 6.5 is considered diabetic. Between 6.4 and 5.7 is prediabetic If your A1C is less than 5.7 you are NOT diabetic.  Targets for Glucose Readings: Time of Check Target for patients WITHOUT Diabetes Target for DIABETICS  Before Meals Less than 100  less than 150  Two hours after meals Less than 200  Less than 250       Bad carbs also include fruit juice, alcohol, and sweet tea. These are empty calories that do not signal to your brain that you are full.   Please remember the good carbs are still carbs which convert into sugar. So please measure them out no more than 1/2-1 cup of rice, oatmeal, pasta, and beans  Veggies are however free foods! Pile them on.   Not all fruit is created equal. Please see the list below, the fruit at the bottom is higher in sugars than the fruit at the top. Please avoid all dried fruits.

## 2016-12-16 LAB — MAGNESIUM: MAGNESIUM: 1.9 mg/dL (ref 1.5–2.5)

## 2016-12-16 LAB — HEMOGLOBIN A1C
HEMOGLOBIN A1C: 6.1 % — AB (ref <5.7)
MEAN PLASMA GLUCOSE: 128 mg/dL

## 2016-12-16 NOTE — Progress Notes (Signed)
Pt aware of lab results & voiced understanding of those results.

## 2016-12-29 ENCOUNTER — Ambulatory Visit (INDEPENDENT_AMBULATORY_CARE_PROVIDER_SITE_OTHER): Payer: Medicare Other | Admitting: Podiatry

## 2016-12-29 ENCOUNTER — Encounter: Payer: Self-pay | Admitting: Podiatry

## 2016-12-29 DIAGNOSIS — B351 Tinea unguium: Secondary | ICD-10-CM

## 2016-12-29 DIAGNOSIS — M79676 Pain in unspecified toe(s): Secondary | ICD-10-CM

## 2016-12-29 DIAGNOSIS — M2010 Hallux valgus (acquired), unspecified foot: Secondary | ICD-10-CM

## 2016-12-29 DIAGNOSIS — E119 Type 2 diabetes mellitus without complications: Secondary | ICD-10-CM

## 2016-12-29 NOTE — Progress Notes (Signed)
Patient ID: Tracey Todd, female   DOB: 02/02/1935, 81 y.o.   MRN: [id number] Complaint:  Visit Type: Patient returns to my office for continued preventative foot care services. Complaint: Patient states" my nails have grown long and thick and become painful to walk and wear shoes" Patient has been diagnosed with DM with no foot complications... The patient presents for preventative foot care services. No changes to ROS  Podiatric Exam: Vascular: dorsalis pedis and posterior tibial pulses are palpable bilateral. Capillary return is immediate. Temperature gradient is WNL. Skin turgor WNL  Sensorium: Normal Semmes Weinstein monofilament test. Normal tactile sensation bilaterally. Nail Exam: Pt has thick disfigured discolored nails with subungual debris noted bilateral entire nail hallux through fifth toenails Ulcer Exam: There is no evidence of ulcer or pre-ulcerative changes or infection. Orthopedic Exam: Muscle tone and strength are WNL. No limitations in general ROM. No crepitus or effusions noted. Foot type and digits show no abnormalities. Mild dorsomedial exostosis noted. Skin: No Porokeratosis. No infection or ulcers  Diagnosis:  Onychomycosis, , Pain in right toe, pain in left toes  Treatment & Plan Procedures and Treatment: Consent by patient was obtained for treatment procedures. The patient understood the discussion of treatment and procedures well. All questions were answered thoroughly reviewed. Debridement of mycotic and hypertrophic toenails, 1 through 5 bilateral and clearing of subungual debris. No ulceration, no infection noted. Minimal mechanical debridement requested.  Patient is not interested in diabetic shoes. Return Visit-Office Procedure: Patient instructed to return to the office for a follow up visit 3 months for continued evaluation and treatment.    Ramsey Guadamuz DPM 

## 2016-12-30 ENCOUNTER — Encounter: Payer: Self-pay | Admitting: Physician Assistant

## 2016-12-30 ENCOUNTER — Ambulatory Visit (INDEPENDENT_AMBULATORY_CARE_PROVIDER_SITE_OTHER): Payer: Medicare Other | Admitting: Physician Assistant

## 2016-12-30 VITALS — BP 120/86 | HR 65 | Temp 97.7°F | Resp 16 | Ht <= 58 in | Wt 174.4 lb

## 2016-12-30 DIAGNOSIS — J0111 Acute recurrent frontal sinusitis: Secondary | ICD-10-CM | POA: Diagnosis not present

## 2016-12-30 MED ORDER — AZITHROMYCIN 250 MG PO TABS: ORAL_TABLET | ORAL | 1 refills | Status: AC

## 2016-12-30 NOTE — Progress Notes (Signed)
Subjective:    Patient ID: Tracey Todd, female    DOB: 10/11/1934, 82 y.o.   MRN: [id number]  HPI 82 y.o. AAF with history of CHF, SSS a/p pacemaker, Afib, DM2 presents with cough. She states she has had a cough x last Saturday, gets this every year, denies fever, chills (had fever Saturday), SOB, weight change, PND, orthopnea. Unchanged edema in her legs.   Wt Readings from Last 3 Encounters:  12/30/16 174 lb 6.4 oz (79.1 kg)  12/15/16 173 lb 3.2 oz (78.6 kg)  10/06/16 176 lb (79.8 kg)   Blood pressure 120/86, pulse 65, temperature 97.7 F (36.5 C), resp. rate 16, height 4\' 10"  (1.473 m), weight 174 lb 6.4 oz (79.1 kg), SpO2 97 %.  Medications Current Outpatient Prescriptions on File Prior to Visit  Medication Sig  . Acetaminophen (ARTHRITIS PAIN RELIEVER PO) Take 2 capsules by mouth daily as needed (arthritis).   Marland Kitchen. amLODipine (NORVASC) 10 MG tablet Take 1 tablet (10 mg total) by mouth daily.  Marland Kitchen. aspirin 81 MG tablet Take 81 mg by mouth daily.  . calcium carbonate (OS-CAL) 600 MG TABS tablet Take 600 mg by mouth daily with breakfast.  . Cetirizine HCl (ZYRTEC ALLERGY PO) Take 1 tablet by mouth daily.  . Cholecalciferol (VITAMIN D) 2000 UNITS CAPS Take 6,000 Units by mouth daily.   Marland Kitchen. glucose blood (FREESTYLE LITE) test strip Check blood sugar 1 time daily. DX-E11.21.  . hydrochlorothiazide (HYDRODIURIL) 25 MG tablet TAKE ONE TABLET BY MOUTH ONCE DAILY  . Lancets (FREESTYLE) lancets USE 1 LANCET TO CHECK GLUCOSE ONCE DAILY. DX-E11.21  . Magnesium 500 MG TABS Take 500 mg by mouth 4 (four) times daily.   . metFORMIN (GLUCOPHAGE-XR) 500 MG 24 hr tablet Take 2 tablets (1,000 mg total) by mouth 2 (two) times daily.  . potassium chloride SA (K-DUR,KLOR-CON) 20 MEQ tablet Take 1 tablet (20 mEq total) by mouth daily.  Marland Kitchen. atorvastatin (LIPITOR) 80 MG tablet Take 1/2 to 1 tablet daily or as directed for Cholesterol   No current facility-administered medications on file prior to visit.      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   Review of Systems  Constitutional: Negative for chills and diaphoresis.  HENT: Positive for congestion, postnasal drip, sinus pressure and sneezing. Negative for ear pain and sore throat.   Respiratory: Positive for cough. Negative for chest tightness, shortness of breath and wheezing.   Cardiovascular: Negative.  Negative for chest pain, palpitations and leg swelling.  Gastrointestinal: Negative.   Genitourinary: Negative.   Musculoskeletal: Negative for neck pain.  Neurological: Negative for dizziness and headaches.       Objective:   Physical Exam  Constitutional: She is oriented to person, place, and time. She appears well-developed and well-nourished.  HENT:  Head: Normocephalic and atraumatic.  Right Ear: External ear normal.  Left Ear: External ear normal.  Nose: Mucosal edema and rhinorrhea present. Right sinus exhibits frontal sinus tenderness. Left sinus exhibits frontal sinus tenderness.  Mouth/Throat: Oropharynx is clear and moist.  Eyes: Conjunctivae are normal. Pupils are equal, round, and reactive to light.  Neck: Normal range of motion. Neck supple.  Cardiovascular: Normal rate and regular rhythm.   Pulmonary/Chest: Effort normal. No respiratory distress. She has no wheezes. She has no rales. She exhibits no tenderness.  Abdominal: Soft. Bowel sounds are normal.  Musculoskeletal: Edema: mild unchanged edema.  Lymphadenopathy:     She has no cervical adenopathy.  Neurological: She is alert and oriented to person, place, and time.  Skin: Skin is warm and dry.       Assessment & Plan:  1. Acute recurrent frontal sinusitis Will hold the zpak and take if she is not getting better, increase fluids, rest, cont allergy pill Weight stable, monitor weight, add flonase Call if not better or go to the ER if worse

## 2016-12-30 NOTE — Patient Instructions (Signed)
Get on allergy pill Please pick one of the over the counter allergy medications below and take it once daily for allergies.  Claritin or loratadine cheapest but likely the weakest  Zyrtec or certizine at night because it can make you sleepy The strongest is allegra or fexafinadine  Cheapest at [employer], [employer], [employer]  Continue nasal spray  Continue cough syrup  If any shortness of breath, worsening swelling, weight gain, trouble lying flat, or chest pain call the office or go to the ER  Weight self daily  HOW TO TREAT VIRAL COUGH AND COLD SYMPTOMS:  -Symptoms usually last at least 1 week with the worst symptoms being around day 4.  - colds usually start with a sore throat and end with a cough, and the cough can take 2 weeks to get better.  -No antibiotics are needed for colds, flu, sore throats, cough, bronchitis UNLESS symptoms are longer than 7 days OR if you are getting better then get drastically worse.  -There are a lot of combination medications (Dayquil, Nyquil, Vicks 44, tyelnol cold and sinus, ETC). Please look at the ingredients on the back so that you are treating the correct symptoms and not doubling up on medications/ingredients.    Medicines you can use  Nasal congestion  - pseudoephedrine (Sudafed)- behind the counter, do not use if you have high blood pressure, medicine that have -D in them.  - phenylephrine (Sudafed PE) -Dextormethorphan + chlorpheniramine (Coridcidin HBP)- okay if you have high blood pressure -Oxymetazoline (Afrin) nasal spray- LIMIT to 3 days -Saline nasal spray -Neti pot (used distilled or bottled water)  Ear pain/congestion  -pseudoephedrine (sudafed) - Nasonex/flonase nasal spray  Fever  -Acetaminophen (Tyelnol) -Ibuprofen (Advil, motrin, aleve)  Sore Throat  -Acetaminophen (Tyelnol) -Ibuprofen (Advil, motrin, aleve) -Drink a lot of water -Gargle with salt water - Rest your voice (don't talk) -Throat sprays -Cough drops  Body  Aches  -Acetaminophen (Tyelnol) -Ibuprofen (Advil, motrin, aleve)  Headache  -Acetaminophen (Tyelnol) -Ibuprofen (Advil, motrin, aleve) - Exedrin, Exedrin Migraine  Allergy symptoms (cough, sneeze, runny nose, itchy eyes) -Claritin or loratadine cheapest but likely the weakest  -Zyrtec or certizine at night because it can make you sleepy -The strongest is allegra or fexafinadine  Cheapest at [employer]mart, [employer], [employer]  Cough  -Dextromethorphan (Delsym)- medicine that has DM in it -Guafenesin (Mucinex/Robitussin) - cough drops - drink lots of water  Chest Congestion  -Guafenesin (Mucinex/Robitussin)  Red Itchy Eyes  - Naphcon-A  Upset Stomach  - Bland diet (nothing spicy, greasy, fried, and high acid foods like tomatoes, oranges, berries) -OKAY- cereal, bread, soup, crackers, rice -Eat smaller more frequent meals -reduce caffeine, no alcohol -Loperamide (Imodium-AD) if diarrhea -Prevacid for heart burn  General health when sick  -Hydration -wash your hands frequently -keep surfaces clean -change pillow cases and sheets often -Get fresh air but do not exercise strenuously -Vitamin D, double up on it - Vitamin C -Zinc

## 2017-01-05 ENCOUNTER — Other Ambulatory Visit: Payer: Self-pay | Admitting: Internal Medicine

## 2017-01-09 ENCOUNTER — Other Ambulatory Visit: Payer: Self-pay | Admitting: Internal Medicine

## 2017-01-10 ENCOUNTER — Other Ambulatory Visit: Payer: Self-pay | Admitting: *Deleted

## 2017-01-10 ENCOUNTER — Other Ambulatory Visit: Payer: Self-pay | Admitting: Internal Medicine

## 2017-01-10 DIAGNOSIS — E119 Type 2 diabetes mellitus without complications: Secondary | ICD-10-CM | POA: Diagnosis not present

## 2017-01-10 MED ORDER — FREESTYLE LANCETS MISC: 1 refills | Status: DC

## 2017-01-13 DIAGNOSIS — E119 Type 2 diabetes mellitus without complications: Secondary | ICD-10-CM | POA: Diagnosis not present

## 2017-02-02 ENCOUNTER — Other Ambulatory Visit: Payer: Self-pay | Admitting: Nurse Practitioner

## 2017-02-20 NOTE — Progress Notes (Signed)
Electrophysiology Office Note Date: 02/24/2017  ID:  Tracey, Todd 1934-11-05, MRN [id number]  PCP: Lucky Cowboy, MD Primary Cardiologist: [name] Electrophysiologist: Allred  CC: Pacemaker follow-up  Tracey Todd is a 82 y.o. female seen today for Dr [name].  She presents today for routine electrophysiology followup.  Since last being seen in our clinic, the patient reports doing very well.  She remains active and is enjoying her garden this summer.  She denies chest pain, palpitations, dyspnea, PND, orthopnea, nausea, vomiting, dizziness, syncope, weight gain, or early satiety. Her edema has improved since restarting HCTZ.  Device History: STJ dual chamber PPM implanted 2016 for SSS   Past Medical History:  Diagnosis Date  . DJD (degenerative joint disease)   . Fatty liver   . HTN (hypertension)   . Hypercholesteremia   . Nodule of chest wall   . Paroxysmal atrial fibrillation (HCC)    a. identified on device interrogation, burden low  . Sick sinus syndrome (HCC)    a. s/p STJ dual chamber PPM   . Type II or unspecified type diabetes mellitus without mention of complication, not stated as uncontrolled   . Vitamin D deficiency    Past Surgical History:  Procedure Laterality Date  . BREAST SURGERY    . CATARACT EXTRACTION    . LEFT AND RIGHT HEART CATHETERIZATION WITH CORONARY ANGIOGRAM N/A 10/23/2014   Procedure: LEFT AND RIGHT HEART CATHETERIZATION WITH CORONARY ANGIOGRAM;  Surgeon: Micheline Chapman, MD;  Location: [facility] CATH LAB;  Service: Cardiovascular;  Laterality: N/A;  . PERMANENT PACEMAKER INSERTION N/A 10/25/2014   STJ dual chamber PPM implanted by Dr [name] for SSS  . TONSILLECTOMY AND ADENOIDECTOMY    . TOTAL KNEE ARTHROPLASTY      Current Outpatient Prescriptions  Medication Sig Dispense Refill  . Acetaminophen (ARTHRITIS PAIN RELIEVER PO) Take 2 capsules by mouth daily as needed (arthritis).     Marland Kitchen amLODipine (NORVASC) 10 MG tablet Take 1 tablet  (10 mg total) by mouth daily. 30 tablet 3  . aspirin 81 MG tablet Take 81 mg by mouth daily.    Marland Kitchen atorvastatin (LIPITOR) 80 MG tablet Take 1/2 to 1 tablet daily or as directed for Cholesterol 30 tablet 1  . calcium carbonate (OS-CAL) 600 MG TABS tablet Take 600 mg by mouth daily with breakfast.    . Cetirizine HCl (ZYRTEC ALLERGY PO) Take 1 tablet by mouth daily.    . Cholecalciferol (VITAMIN D) 2000 UNITS CAPS Take 6,000 Units by mouth daily.     Marland Kitchen FREESTYLE LITE test strip USE ONE STRIP TO CHECK GLUCOSE ONCE DAILY 100 each 1  . hydrochlorothiazide (HYDRODIURIL) 25 MG tablet TAKE ONE TABLET BY MOUTH ONCE DAILY 90 tablet 0  . Lancets (FREESTYLE) lancets Check blood sugar 1 time daily-DX-E11.9. 100 each 1  . Magnesium 500 MG TABS Take 500 mg by mouth 4 (four) times daily.     . metFORMIN (GLUCOPHAGE-XR) 500 MG 24 hr tablet Take 2 tablets (1,000 mg total) by mouth 2 (two) times daily. 360 tablet 2  . potassium chloride SA (K-DUR,KLOR-CON) 20 MEQ tablet Take 1 tablet (20 mEq total) by mouth daily. 90 tablet 1   No current facility-administered medications for this visit.     Allergies:   Penicillins; Feldene [piroxicam]; Calcium-containing compounds; Celebrex [celecoxib]; and Daypro [oxaprozin]   Social History: Social History   Social History  . Marital status: Married    Spouse name: N/A  . Number of children: N/A  .  Years of education: N/A   Occupational History  . Not on file.   Social History Main Topics  . Smoking status: Former Smoker    Quit date: 12/06/1963  . Smokeless tobacco: Never Used  . Alcohol use No  . Drug use: Unknown  . Sexual activity: Not on file   Other Topics Concern  . Not on file   Social History Narrative  . No narrative on file    Family History: Family History  Problem Relation Age of Onset  . Diabetes Mother   . Cancer Mother   . Cancer Father      Review of Systems: All other systems reviewed and are otherwise negative except as noted  above.   Physical Exam: VS:  BP 120/60   Pulse 60   Ht 4\' 10"  (1.473 m)   Wt 175 lb (79.4 kg)   BMI 36.58 kg/m  , BMI Body mass index is 36.58 kg/m.  GEN- The patient is obese appearing, alert and oriented x 3 today.   HEENT: normocephalic, atraumatic; sclera clear, conjunctiva pink; hearing intact; oropharynx clear; neck supple Lungs- Clear to ausculation bilaterally, normal work of breathing.  No wheezes, rales, rhonchi Heart- Regular rate and rhythm, no murmurs, rubs or gallops  GI- soft, non-tender, non-distended, bowel sounds present  Extremities- no clubbing, cyanosis, +trace BLE edema MS- no significant deformity or atrophy Skin- warm and dry, no rash or lesion; PPM pocket well healed Psych- euthymic mood, full affect Neuro- strength and sensation are intact  PPM Interrogation- reviewed in detail today,  See PACEART report  EKG:  EKG is ordered today. The ekg ordered today shows atrial pacing with intrinsic ventricular conduction  Recent Labs: 12/15/2016: ALT 6; BUN 20; Creat 0.72; Hemoglobin 12.0; Magnesium 1.9; Platelets 297; Potassium 4.3; Sodium 139; TSH 2.00   Wt Readings from Last 3 Encounters:  02/24/17 175 lb (79.4 kg)  12/30/16 174 lb 6.4 oz (79.1 kg)  12/15/16 173 lb 3.2 oz (78.6 kg)    Assessment and Plan:  1.  Symptomatic bradycardia Normal PPM function See Pace Art report No changes today  2.  Paroxysmal atrial fibrillation/atrial flutter Burden by device interrogation today <1% Longest episode since last remote <55minutes CHADS2VASC is 5 Discussed with patient today, will follow AF burden remotely and start OAC if burden increases.  Pt aware and agrees with plan.   3.  Obesity Body mass index is 36.58 kg/m. Weight loss encouraged   4.  HTN Stable No change required today PCP checking labs next week with physical    Current medicines are reviewed at length with the patient today.   The patient does not have concerns regarding her  medicines.  The following changes were made today:  none  Labs/ tests ordered today include: none  Disposition:   Follow up with Merlin transmissions, Dr Johney FrameAllred 1 year    Signed, Gypsy BalsamAmber Gabriela Giannelli, NP 02/24/2017 9:00 AM  [facility] [address] [place] [address] [phone] (office) [phone] (fax)

## 2017-02-22 ENCOUNTER — Encounter: Payer: Self-pay | Admitting: Nurse Practitioner

## 2017-02-24 ENCOUNTER — Encounter: Payer: Self-pay | Admitting: Nurse Practitioner

## 2017-02-24 ENCOUNTER — Ambulatory Visit (INDEPENDENT_AMBULATORY_CARE_PROVIDER_SITE_OTHER): Payer: Medicare Other | Admitting: Nurse Practitioner

## 2017-02-24 VITALS — BP 120/60 | HR 60 | Ht <= 58 in | Wt 175.0 lb

## 2017-02-24 DIAGNOSIS — I480 Paroxysmal atrial fibrillation: Secondary | ICD-10-CM

## 2017-02-24 DIAGNOSIS — E119 Type 2 diabetes mellitus without complications: Secondary | ICD-10-CM | POA: Diagnosis not present

## 2017-02-24 DIAGNOSIS — I495 Sick sinus syndrome: Secondary | ICD-10-CM | POA: Diagnosis not present

## 2017-02-24 DIAGNOSIS — I10 Essential (primary) hypertension: Secondary | ICD-10-CM | POA: Diagnosis not present

## 2017-02-24 LAB — CUP PACEART INCLINIC DEVICE CHECK
Date Time Interrogation Session: 20180628085300
Implantable Lead Implant Date: 20160226
Implantable Lead Model: 1948
Implantable Pulse Generator Implant Date: 20160226
MDC IDC LEAD IMPLANT DT: 20160226
MDC IDC LEAD LOCATION: 753859
MDC IDC LEAD LOCATION: 753860
MDC IDC PG SERIAL: 7700661
Pulse Gen Model: 2240

## 2017-02-24 MED ORDER — HYDROCHLOROTHIAZIDE 25 MG PO TABS: 25.0000 mg | ORAL_TABLET | Freq: Every day | ORAL | 3 refills | Status: DC

## 2017-02-24 NOTE — Patient Instructions (Signed)
Medication Instructions:  Your physician recommends that you continue on your current medications as directed. Please refer to the Current Medication list given to you today.   Labwork: None Ordered   Testing/Procedures: None Ordered   Follow-Up: Remote monitoring is used to monitor your Pacemaker of ICD from home. This monitoring reduces the number of office visits required to check your device to one time per year. It allows us to keep an eye on the functioning of your device to ensure it is working properly. You are scheduled for a device check from home on 05/26/17. You may send your transmission at any time that day. If you have a wireless device, the transmission will be sent automatically. After your physician reviews your transmission, you will receive a postcard with your next transmission date.   Your physician wants you to follow-up in: 1 year with Dr. Allred. You will receive a reminder letter in the mail two months in advance. If you don't receive a letter, please call our office to schedule the follow-up appointment.   Any Other Special Instructions Will Be Listed Below (If Applicable).     If you need a refill on your cardiac medications before your next appointment, please call your pharmacy.   

## 2017-03-27 ENCOUNTER — Encounter: Payer: Self-pay | Admitting: Internal Medicine

## 2017-03-27 NOTE — Patient Instructions (Signed)

## 2017-03-27 NOTE — Progress Notes (Signed)
[facility] Lucky CowboyWilliam Wallie Lagrand, M.D.      Dyanne CarrelAmanda R. Steffanie Dunnollier, P.A.-C [facility]                [address]                [place], [place] [id number] Telephone [phone] Telefax [phone]  Annual Screening/Preventative Visit & Comprehensive Evaluation &  Examination     This very nice 82 y.o. WBF presents for a Screening/Preventative Visit & comprehensive evaluation and management of multiple medical co-morbidities.  Patient has been followed for HTN, ASCAD, T2_NIDDM, Hyperlipidemia and Vitamin D Deficiency. Patient has seen  Dr Regino SchultzeWang for DDD & EDSI's. Today she also relates a 6 mo hx/o Left hip pains and L sciatica type pains to the Left foot exacerbated with walking and relieved at rest.        HTN predates circa 1990's  Patient has ASHD/SSS  pAfib w/ St Jude PPM since Feb 2016  (Dr Johney FrameAllred). At that time Heart Cath found no significant CAD. Patient is on LD bASA. Patient's BP has been controlled at home and patient denies any cardiac symptoms as chest pain, palpitations, shortness of breath, dizziness or ankle swelling. Today's BP: 140/74      Patient's hyperlipidemia is controlled with diet and medications. Patient denies myalgias or other medication SE's. Last lipids were  Lab Results  Component Value Date   CHOL 155 12/15/2016   HDL 77 12/15/2016   LDLCALC 56 12/15/2016   TRIG 109 12/15/2016   CHOLHDL 2.0 12/15/2016      Patient has Morbid Obesity (BMI 37+)  and  T2_NIDDM (1997) and patient denies reactive hypoglycemic symptoms, visual blurring, diabetic polys, or paresthesias.  She alleges CBG's range less than 130 mg%. Last A1c was not at goal: Lab Results  Component Value Date   HGBA1C 6.1 (H) 12/15/2016      Finally, patient has history of Vitamin D Deficiency and last Vitamin D was  Lab Results  Component Value Date   VD25OH 67 09/10/2016   Current Outpatient Prescriptions on File Prior to Visit   Medication Sig  . Acetaminophen  Take 2 capsules by mouth daily as needed (arthritis).   Marland Kitchen. amLODipine  10 MG Take 1 tablet (10 mg total) by mouth daily.  Marland Kitchen. aspirin 81 MG  Take 81 mg by mouth daily.  Marland Kitchen. atorvastatin 80 MG Take 1/2 to 1 tablet daily or as directed for Cholesterol  . OS-CAL 600 MG  Take 600 mg by mouth daily with breakfast.  . Cetirizine 10 mg Take 1 tablet by mouth daily.  Marland Kitchen. VITAMIN D 2000 UNITS Take 6,000 Units by mouth daily.   . hctz 25 MG  Take 1 tablet (25 mg total) by mouth daily.  . Magnesium 500 MG  Take 500 mg by mouth 4 (four) times daily.   . metFORMIN-XR 500 MG  Take 2 tablets (1,000 mg total) by mouth 2 (two) times daily.  Marland Kitchen. K-DUR 20 MEQ  Take 1 tablet (20 mEq total) by mouth daily.   Allergies  Allergen Reactions  . Penicillins Anaphylaxis  . Feldene [Piroxicam] Other (See Comments)    Bleeding   . Calcium-Containing Compounds Other (See Comments)    "calcium deposits on skin"  . Celebrex [Celecoxib] Itching  . Daypro [Oxaprozin] Itching   Past Medical History:  Diagnosis Date  . DJD (degenerative joint disease)   . Fatty liver   . HTN (hypertension)   .  Hypercholesteremia   . Nodule of chest wall   . Paroxysmal atrial fibrillation (HCC)    a. identified on device interrogation, burden low  . Sick sinus syndrome (HCC)    a. s/p STJ dual chamber PPM   . Type II or unspecified type diabetes mellitus without mention of complication, not stated as uncontrolled   . Vitamin D deficiency    Health Maintenance  Topic Date Due  . URINE MICROALBUMIN  02/24/2017  . INFLUENZA VACCINE  03/30/2017  . OPHTHALMOLOGY EXAM  06/14/2017  . HEMOGLOBIN A1C  06/16/2017  . FOOT EXAM  03/27/2018  . TETANUS/TDAP  08/30/2021  . DEXA SCAN  Completed  . PNA vac Low Risk Adult  Completed   Immunization History  Administered Date(s) Administered  . Influenza Split 05/23/2013  . Influenza, High Dose Seasonal PF 05/30/2014, 05/19/2015, 06/02/2016  . Pneumococcal  Conjugate-13 08/06/2015  . Pneumococcal Polysaccharide-23 07/29/2013  . Td 08/31/2011   Past Surgical History:  Procedure Laterality Date  . BREAST SURGERY    . CATARACT EXTRACTION    . LEFT AND RIGHT HEART CATHETERIZATION WITH CORONARY ANGIOGRAM N/A 10/23/2014   Procedure: LEFT AND RIGHT HEART CATHETERIZATION WITH CORONARY ANGIOGRAM;  Surgeon: Micheline ChapmanMichael D Cooper, MD;  [facility];  Service: Cardiovascular;  Laterality: N/A;  . PERMANENT PACEMAKER INSERTION N/A 10/25/2014   STJ dual chamber PPM implanted by Dr Johney FrameAllred for SSS  . TONSILLECTOMY AND ADENOIDECTOMY    . TOTAL KNEE ARTHROPLASTY     Family History  Problem Relation Age of Onset  . Diabetes Mother   . Cancer Mother   . Cancer Father    Social History  Substance Use Topics  . Smoking status: Former Smoker    Quit date: 12/06/1963  . Smokeless tobacco: Never Used  . Alcohol use No    ROS Constitutional: Denies fever, chills, weight loss/gain, headaches, insomnia,  night sweats, and change in appetite. Does c/o fatigue. Eyes: Denies redness, blurred vision, diplopia, discharge, itchy, watery eyes.  ENT: Denies discharge, congestion, post nasal drip, epistaxis, sore throat, earache, hearing loss, dental pain, Tinnitus, Vertigo, Sinus pain, snoring.  Cardio: Denies chest pain, palpitations, irregular heartbeat, syncope, dyspnea, diaphoresis, orthopnea, PND, claudication, edema Respiratory: denies cough, dyspnea, DOE, pleurisy, hoarseness, laryngitis, wheezing.  Gastrointestinal: Denies dysphagia, heartburn, reflux, water brash, pain, cramps, nausea, vomiting, bloating, diarrhea, constipation, hematemesis, melena, hematochezia, jaundice, hemorrhoids Genitourinary: Denies dysuria, frequency, urgency, nocturia, hesitancy, discharge, hematuria, flank pain Breast: Breast lumps, nipple discharge, bleeding.  Musculoskeletal: Denies arthralgia, myalgia, stiffness, Jt. Swelling, pain, limp, and strain/sprain. Denies falls. Skin:  Denies puritis, rash, hives, warts, acne, eczema, changing in skin lesion Neuro: No weakness, tremor, incoordination, spasms, paresthesia, pain Psychiatric: Denies confusion, memory loss, sensory loss. Denies Depression. Endocrine: Denies change in weight, skin, hair change, nocturia, and paresthesia, diabetic polys, visual blurring, hyper / hypo glycemic episodes.  Heme/Lymph: No excessive bleeding, bruising, enlarged lymph nodes.  Physical Exam  BP 140/74   Pulse 63   Temp 97.7 F (36.5 C)   Resp 16   Ht 4\' 11"  (1.499 m)   Wt 170 lb 12.8 oz (77.5 kg)   BMI 34.50 kg/m   General Appearance: Over nourished, well groomed and in no apparent distress.  Eyes: PERRLA, EOMs, conjunctiva no swelling or erythema, normal fundi and vessels. Sinuses: No frontal/maxillary tenderness ENT/Mouth: EACs patent / TMs  nl. Nares clear without erythema, swelling, mucoid exudates. Oral hygiene is good. No erythema, swelling, or exudate. Tongue normal, non-obstructing. Tonsils not swollen or  erythematous. Hearing normal.  Neck: Supple, thyroid normal. No bruits, nodes or JVD. Respiratory: Respiratory effort normal.  BS equal and clear bilateral without rales, rhonci, wheezing or stridor. Cardio: Heart sounds are normal with regular rate and rhythm and no murmurs, rubs or gallops. Peripheral pulses are normal and equal bilaterally without edema. No aortic or femoral bruits. Chest: symmetric with normal excursions and percussion. Breasts: Symmetric, without lumps, nipple discharge, retractions, or fibrocystic changes.  Abdomen: Flat, soft with bowel sounds active. Nontender, no guarding, rebound, hernias, masses, or organomegaly.  Lymphatics: Non tender without lymphadenopathy.  Musculoskeletal: Full ROM all peripheral extremities, joint stability, 5/5 strength, and normal gait. Left hip ROM - Normal & neg SLR.  Skin: Warm and dry without rashes, lesions, cyanosis, clubbing or  ecchymosis.  Neuro: Cranial  nerves intact, reflexes equal bilaterally. Normal muscle tone, no cerebellar symptoms. Sensation intact to touch, vibratory and Monofilament to the toes bilaterally. Pysch: Alert and oriented X 3, normal affect, Insight and Judgment appropriate.   Assessment and Plan  1. Annual Preventative Screening Examination  2. Essential hypertension  - EKG 12-Lead - Urinalysis, Routine w reflex microscopic - Microalbumin / creatinine urine ratio - CBC with Differential/Platelet - BASIC METABOLIC PANEL WITH GFR - Magnesium - TSH  3. Hyperlipidemia, mixed  - EKG 12-Lead - Hepatic function panel - Lipid panel - TSH  4. 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 Vitamin D deficiency  - VITAMIN D 25 Hydroxy   6. Screening for rectal cancer  - POC Hemoccult Bld/Stl   7. Screening for ischemic heart disease  - EKG 12-Lead - Lipid panel  8. Class 1 obesity due to excess calories with serious comorbidity and body mass index (BMI) of 34.0 to 34.9 in adult   9. Pacemaker implanted 10/25/14- St Jude  - EKG 12-Lead  10. Medication management  - Urinalysis, Routine w reflex microscopic - Microalbumin / creatinine urine ratio - CBC with Differential/Platelet - BASIC METABOLIC PANEL WITH GFR - Hepatic function panel - Magnesium - Lipid panel - TSH - Hemoglobin A1c - Insulin, random - VITAMIN D 25 Hydroxy  11. Left Hip & Leg Pain   - Orthopedic referral      Patient was counseled in prudent diet to achieve/maintain BMI less than 25 for weight control, BP monitoring, regular exercise and medications. Discussed med's effects and SE's. Screening labs and tests as requested with regular follow-up as recommended. Over 40 minutes of exam, counseling, chart review and high complex critical  decision making was performed.

## 2017-03-28 ENCOUNTER — Encounter: Payer: Self-pay | Admitting: Internal Medicine

## 2017-03-28 ENCOUNTER — Ambulatory Visit (INDEPENDENT_AMBULATORY_CARE_PROVIDER_SITE_OTHER): Payer: Medicare Other | Admitting: Internal Medicine

## 2017-03-28 VITALS — BP 140/74 | HR 63 | Temp 97.7°F | Resp 16 | Ht 59.0 in | Wt 170.8 lb

## 2017-03-28 DIAGNOSIS — Z0000 Encounter for general adult medical examination without abnormal findings: Secondary | ICD-10-CM | POA: Diagnosis not present

## 2017-03-28 DIAGNOSIS — M25552 Pain in left hip: Secondary | ICD-10-CM

## 2017-03-28 DIAGNOSIS — M5432 Sciatica, left side: Secondary | ICD-10-CM

## 2017-03-28 DIAGNOSIS — E6609 Other obesity due to excess calories: Secondary | ICD-10-CM

## 2017-03-28 DIAGNOSIS — I10 Essential (primary) hypertension: Secondary | ICD-10-CM

## 2017-03-28 DIAGNOSIS — E1121 Type 2 diabetes mellitus with diabetic nephropathy: Secondary | ICD-10-CM

## 2017-03-28 DIAGNOSIS — Z79899 Other long term (current) drug therapy: Secondary | ICD-10-CM

## 2017-03-28 DIAGNOSIS — Z950 Presence of cardiac pacemaker: Secondary | ICD-10-CM

## 2017-03-28 DIAGNOSIS — Z136 Encounter for screening for cardiovascular disorders: Secondary | ICD-10-CM | POA: Diagnosis not present

## 2017-03-28 DIAGNOSIS — E782 Mixed hyperlipidemia: Secondary | ICD-10-CM

## 2017-03-28 DIAGNOSIS — E559 Vitamin D deficiency, unspecified: Secondary | ICD-10-CM

## 2017-03-28 DIAGNOSIS — I480 Paroxysmal atrial fibrillation: Secondary | ICD-10-CM

## 2017-03-28 DIAGNOSIS — Z1212 Encounter for screening for malignant neoplasm of rectum: Secondary | ICD-10-CM

## 2017-03-28 DIAGNOSIS — Z6834 Body mass index (BMI) 34.0-34.9, adult: Secondary | ICD-10-CM

## 2017-03-28 DIAGNOSIS — Z1211 Encounter for screening for malignant neoplasm of colon: Secondary | ICD-10-CM | POA: Diagnosis not present

## 2017-03-28 DIAGNOSIS — Z0001 Encounter for general adult medical examination with abnormal findings: Secondary | ICD-10-CM

## 2017-03-28 LAB — HEPATIC FUNCTION PANEL
ALBUMIN: 4.6 g/dL (ref 3.6–5.1)
ALT: 8 U/L (ref 6–29)
AST: 14 U/L (ref 10–35)
Alkaline Phosphatase: 53 U/L (ref 33–130)
BILIRUBIN TOTAL: 0.8 mg/dL (ref 0.2–1.2)
Bilirubin, Direct: 0.2 mg/dL (ref <=0.2)
Indirect Bilirubin: 0.6 mg/dL (ref 0.2–1.2)
Total Protein: 7.1 g/dL (ref 6.1–8.1)

## 2017-03-28 LAB — CBC WITH DIFFERENTIAL/PLATELET
BASOS PCT: 1 %
Basophils Absolute: 57 cells/uL (ref 0–200)
EOS ABS: 114 {cells}/uL (ref 15–500)
Eosinophils Relative: 2 %
HCT: 39.7 % (ref 35.0–45.0)
Hemoglobin: 12.9 g/dL (ref 11.7–15.5)
LYMPHS PCT: 39 %
Lymphs Abs: 2223 cells/uL (ref 850–3900)
MCH: 27.9 pg (ref 27.0–33.0)
MCHC: 32.5 g/dL (ref 32.0–36.0)
MCV: 85.7 fL (ref 80.0–100.0)
MONOS PCT: 12 %
MPV: 9.5 fL (ref 7.5–12.5)
Monocytes Absolute: 684 cells/uL (ref 200–950)
Neutro Abs: 2622 cells/uL (ref 1500–7800)
Neutrophils Relative %: 46 %
PLATELETS: 312 10*3/uL (ref 140–400)
RBC: 4.63 MIL/uL (ref 3.80–5.10)
RDW: 15.4 % — AB (ref 11.0–15.0)
WBC: 5.7 10*3/uL (ref 3.8–10.8)

## 2017-03-28 LAB — LIPID PANEL
Cholesterol: 177 mg/dL (ref <200)
HDL: 77 mg/dL (ref >50)
LDL Cholesterol: 85 mg/dL (ref <100)
TRIGLYCERIDES: 74 mg/dL (ref <150)
Total CHOL/HDL Ratio: 2.3 Ratio (ref <5.0)
VLDL: 15 mg/dL (ref <30)

## 2017-03-28 LAB — BASIC METABOLIC PANEL WITH GFR
BUN: 18 mg/dL (ref 7–25)
CO2: 26 mmol/L (ref 20–31)
CREATININE: 0.8 mg/dL (ref 0.60–0.93)
Calcium: 9.9 mg/dL (ref 8.6–10.4)
Chloride: 100 mmol/L (ref 98–110)
GFR, Est African American: 81 mL/min (ref >=60)
GFR, Est Non African American: 70 mL/min (ref >=60)
Glucose, Bld: 132 mg/dL — ABNORMAL HIGH (ref 65–99)
POTASSIUM: 3.9 mmol/L (ref 3.5–5.3)
Sodium: 138 mmol/L (ref 135–146)

## 2017-03-28 LAB — TSH: TSH: 1.71 mIU/L

## 2017-03-29 LAB — MICROALBUMIN / CREATININE URINE RATIO
CREATININE, URINE: 115 mg/dL (ref 20–320)
MICROALB UR: 3.1 mg/dL
MICROALB/CREAT RATIO: 27 ug/mg{creat} (ref <30)

## 2017-03-29 LAB — URINALYSIS, ROUTINE W REFLEX MICROSCOPIC
Bilirubin Urine: NEGATIVE
Glucose, UA: NEGATIVE
Hgb urine dipstick: NEGATIVE
KETONES UR: NEGATIVE
Nitrite: NEGATIVE
Protein, ur: NEGATIVE
SPECIFIC GRAVITY, URINE: 1.017 (ref 1.001–1.035)
pH: 6 (ref 5.0–8.0)

## 2017-03-29 LAB — URINALYSIS, MICROSCOPIC ONLY
Bacteria, UA: NONE SEEN [HPF]
CASTS: NONE SEEN [LPF]
CRYSTALS: NONE SEEN [HPF]
RBC / HPF: NONE SEEN RBC/HPF (ref <=2)
Yeast: NONE SEEN [HPF]

## 2017-03-29 LAB — HEMOGLOBIN A1C
Hgb A1c MFr Bld: 6.4 % — ABNORMAL HIGH (ref <5.7)
Mean Plasma Glucose: 137 mg/dL

## 2017-03-29 LAB — INSULIN, RANDOM: Insulin: 15.9 u[IU]/mL (ref 2.0–19.6)

## 2017-03-29 LAB — VITAMIN D 25 HYDROXY (VIT D DEFICIENCY, FRACTURES): VIT D 25 HYDROXY: 79 ng/mL (ref 30–100)

## 2017-03-29 LAB — MAGNESIUM: MAGNESIUM: 1.9 mg/dL (ref 1.5–2.5)

## 2017-03-30 ENCOUNTER — Ambulatory Visit (INDEPENDENT_AMBULATORY_CARE_PROVIDER_SITE_OTHER): Payer: Medicare Other | Admitting: Podiatry

## 2017-03-30 ENCOUNTER — Encounter: Payer: Self-pay | Admitting: Podiatry

## 2017-03-30 DIAGNOSIS — B351 Tinea unguium: Secondary | ICD-10-CM

## 2017-03-30 DIAGNOSIS — M5416 Radiculopathy, lumbar region: Secondary | ICD-10-CM | POA: Insufficient documentation

## 2017-03-30 DIAGNOSIS — M5137 Other intervertebral disc degeneration, lumbosacral region: Secondary | ICD-10-CM | POA: Diagnosis not present

## 2017-03-30 DIAGNOSIS — M25551 Pain in right hip: Secondary | ICD-10-CM | POA: Insufficient documentation

## 2017-03-30 DIAGNOSIS — M4697 Unspecified inflammatory spondylopathy, lumbosacral region: Secondary | ICD-10-CM | POA: Diagnosis not present

## 2017-03-30 DIAGNOSIS — E1121 Type 2 diabetes mellitus with diabetic nephropathy: Secondary | ICD-10-CM

## 2017-03-30 DIAGNOSIS — M160 Bilateral primary osteoarthritis of hip: Secondary | ICD-10-CM | POA: Diagnosis not present

## 2017-03-30 DIAGNOSIS — M150 Primary generalized (osteo)arthritis: Secondary | ICD-10-CM | POA: Diagnosis not present

## 2017-03-30 DIAGNOSIS — M47896 Other spondylosis, lumbar region: Secondary | ICD-10-CM | POA: Diagnosis not present

## 2017-03-30 DIAGNOSIS — M79676 Pain in unspecified toe(s): Secondary | ICD-10-CM

## 2017-03-30 DIAGNOSIS — M2010 Hallux valgus (acquired), unspecified foot: Secondary | ICD-10-CM

## 2017-03-30 DIAGNOSIS — M545 Low back pain: Secondary | ICD-10-CM | POA: Diagnosis not present

## 2017-03-30 DIAGNOSIS — M47898 Other spondylosis, sacral and sacrococcygeal region: Secondary | ICD-10-CM | POA: Diagnosis not present

## 2017-03-30 NOTE — Progress Notes (Signed)
Patient ID: Tracey Todd StaffMary C Baumler, female   DOB: 01/15/1935, 82 y.o.   MRN: [id number] Complaint:  Visit Type: Patient returns to my office for continued preventative foot care services. Complaint: Patient states" my nails have grown long and thick and become painful to walk and wear shoes" Patient has been diagnosed with DM with no foot complications... The patient presents for preventative foot care services. No changes to ROS  Podiatric Exam: Vascular: dorsalis pedis and posterior tibial pulses are palpable bilateral. Capillary return is immediate. Temperature gradient is WNL. Skin turgor WNL  Sensorium: Normal Semmes Weinstein monofilament test. Normal tactile sensation bilaterally. Nail Exam: Pt has thick disfigured discolored nails with subungual debris noted bilateral entire nail hallux through fifth toenails Ulcer Exam: There is no evidence of ulcer or pre-ulcerative changes or infection. Orthopedic Exam: Muscle tone and strength are WNL. No limitations in general ROM. No crepitus or effusions noted. Foot type and digits show no abnormalities. Mild dorsomedial exostosis noted. Skin: No Porokeratosis. No infection or ulcers  Diagnosis:  Onychomycosis, , Pain in right toe, pain in left toes  Treatment & Plan Procedures and Treatment: Consent by patient was obtained for treatment procedures. The patient understood the discussion of treatment and procedures well. All questions were answered thoroughly reviewed. Debridement of mycotic and hypertrophic toenails, 1 through 5 bilateral and clearing of subungual debris. No ulceration, no infection noted. Minimal mechanical debridement requested.  Patient is not interested in diabetic shoes. Return Visit-Office Procedure: Patient instructed to return to the office for a follow up visit 3 months for continued evaluation and treatment.    Helane GuntherGregory Xzaria Teo DPM

## 2017-03-31 ENCOUNTER — Other Ambulatory Visit: Payer: Self-pay | Admitting: *Deleted

## 2017-03-31 MED ORDER — ATORVASTATIN CALCIUM 80 MG PO TABS
ORAL_TABLET | ORAL | 1 refills | Status: DC
Start: 2017-03-31 — End: 2018-12-25

## 2017-04-04 ENCOUNTER — Other Ambulatory Visit: Payer: Self-pay | Admitting: Internal Medicine

## 2017-04-13 ENCOUNTER — Other Ambulatory Visit: Payer: Self-pay

## 2017-04-13 DIAGNOSIS — Z1212 Encounter for screening for malignant neoplasm of rectum: Secondary | ICD-10-CM

## 2017-04-14 LAB — POC HEMOCCULT BLD/STL (HOME/3-CARD/SCREEN)
Card #3 Fecal Occult Blood, POC: NEGATIVE
FECAL OCCULT BLD: NEGATIVE
FECAL OCCULT BLD: NEGATIVE

## 2017-04-16 DIAGNOSIS — E119 Type 2 diabetes mellitus without complications: Secondary | ICD-10-CM | POA: Diagnosis not present

## 2017-04-20 ENCOUNTER — Other Ambulatory Visit: Payer: Self-pay

## 2017-04-20 DIAGNOSIS — Z1212 Encounter for screening for malignant neoplasm of rectum: Secondary | ICD-10-CM

## 2017-04-20 LAB — POC HEMOCCULT BLD/STL (HOME/3-CARD/SCREEN)
Card #2 Fecal Occult Blod, POC: NEGATIVE
Card #3 Fecal Occult Blood, POC: NEGATIVE
Fecal Occult Blood, POC: NEGATIVE

## 2017-04-27 ENCOUNTER — Ambulatory Visit (INDEPENDENT_AMBULATORY_CARE_PROVIDER_SITE_OTHER): Payer: Medicare Other | Admitting: Physician Assistant

## 2017-04-27 ENCOUNTER — Encounter: Payer: Self-pay | Admitting: Physician Assistant

## 2017-04-27 VITALS — BP 132/80 | HR 75 | Temp 98.1°F | Resp 14 | Ht 59.0 in | Wt 172.8 lb

## 2017-04-27 DIAGNOSIS — R05 Cough: Secondary | ICD-10-CM

## 2017-04-27 DIAGNOSIS — R059 Cough, unspecified: Secondary | ICD-10-CM

## 2017-04-27 DIAGNOSIS — E119 Type 2 diabetes mellitus without complications: Secondary | ICD-10-CM | POA: Diagnosis not present

## 2017-04-27 MED ORDER — AZITHROMYCIN 250 MG PO TABS: ORAL_TABLET | ORAL | 1 refills | Status: AC

## 2017-04-27 NOTE — Progress Notes (Signed)
Subjective:    Patient ID: Tracey Todd, female    DOB: 1935-03-30, 82 y.o.   MRN: [id number]  HPI 82 y.o. AAF with history of SSS s/p pacemaker, CHF, pulmonary HTN, DM 2 presents with cold symptoms x Monday. She has had chills, fever, cough, nasal congestion, no PND, orthopnea, weight is up 2 lbs.   BMI is Body mass index is 34.9 kg/m., she is working on diet and exercise. Wt Readings from Last 3 Encounters:  04/27/17 172 lb 12.8 oz (78.4 kg)  03/28/17 170 lb 12.8 oz (77.5 kg)  02/24/17 175 lb (79.4 kg)    Blood pressure 132/80, pulse 75, temperature 98.1 F (36.7 C), resp. rate 14, height 4\' 11"  (1.499 m), weight 172 lb 12.8 oz (78.4 kg), SpO2 95 %.  Medications Current Outpatient Prescriptions on File Prior to Visit  Medication Sig  . Acetaminophen (ARTHRITIS PAIN RELIEVER PO) Take 2 capsules by mouth daily as needed (arthritis).   Marland Kitchen amLODipine (NORVASC) 10 MG tablet Take 1 tablet (10 mg total) by mouth daily.  Marland Kitchen aspirin 81 MG tablet Take 81 mg by mouth daily.  Marland Kitchen atorvastatin (LIPITOR) 80 MG tablet Take 1/2 to 1 tablet daily or as directed for Cholesterol  . calcium carbonate (OS-CAL) 600 MG TABS tablet Take 600 mg by mouth daily with breakfast.  . Cetirizine HCl (ZYRTEC ALLERGY PO) Take 1 tablet by mouth daily.  . Cholecalciferol (VITAMIN D) 2000 UNITS CAPS Take 6,000 Units by mouth daily.   Marland Kitchen FREESTYLE LITE test strip USE ONE STRIP TO CHECK GLUCOSE ONCE DAILY  . hydrochlorothiazide (HYDRODIURIL) 25 MG tablet Take 1 tablet (25 mg total) by mouth daily.  . Lancets (FREESTYLE) lancets Check blood sugar 1 time daily-[id number].  . Magnesium 500 MG TABS Take 500 mg by mouth 4 (four) times daily.   . metFORMIN (GLUCOPHAGE-XR) 500 MG 24 hr tablet TAKE TWO TABLETS BY MOUTH TWICE DAILY  . potassium chloride SA (K-DUR,KLOR-CON) 20 MEQ tablet Take 1 tablet (20 mEq total) by mouth daily.   No current facility-administered medications on file prior to visit.     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    Review of Systems  Constitutional: Positive for fatigue. Negative for chills and fever.  HENT: Positive for congestion, rhinorrhea, sinus pressure and sore throat. Negative for dental problem, ear discharge, ear pain, nosebleeds, trouble swallowing and voice change.   Respiratory: Positive for cough. Negative for chest tightness, shortness of breath and wheezing.   Cardiovascular: Negative.  Negative for leg swelling.  Gastrointestinal: Negative.   Genitourinary: Negative.   Musculoskeletal: Negative.   Neurological: Negative.        Objective:   Physical Exam  Constitutional: She is oriented to person, place, and time. She appears well-developed and well-nourished.  HENT:  Head: Normocephalic and atraumatic.  Right Ear: External ear normal.  Left Ear: External ear normal.  Nose: Nose normal.  Mouth/Throat: Oropharynx is clear and moist.  Eyes: Pupils are equal, round, and reactive to light. Conjunctivae are normal.  Neck: Normal range of motion. Neck supple.  Cardiovascular: Normal rate and  regular rhythm.   Pulmonary/Chest: Effort normal. No respiratory distress. She has wheezes. She has no rales. She exhibits no tenderness.  Abdominal: Soft. Bowel sounds are normal.  Lymphadenopathy:    She has no cervical adenopathy.  Neurological: She is alert and oriented to person, place, and time.  Skin: Skin is warm and dry.          Assessment & Plan:  Cough No edema,  weight up 2 lbs but stable, no PND, orthopnea With fever, chills, likely infection Will avoid prednisone, will do breo samples, zpak. Albuerol, had cough med Weight daily, if any changes in weight call the office or go to ER if worsening shortness of breath or chest pain  Future Appointments Date Time Provider Department Center  05/26/2017 8:35 AM [facility] DEVICE REMOTES [facility]  07/01/2017 7:45 AM Helane GuntherMayer, Gregory, DPM [facility]  07/20/2017 8:45 AM Quentin Mullingollier, Tieisha Darden, PA-C [facility] None  10/21/2017 9:30 AM Lucky CowboyMcKeown, William, MD [facility] None  03/06/2018 9:30 AM Hillis RangeAllred, James, MD [facility]

## 2017-04-27 NOTE — Patient Instructions (Signed)
Will avoid prednisone, will do breo samples, zpak. Albuerol, had cough med Use the breo once a day before you brush your teeth Wash mouth out afterwards or you can get yeast in your mouth Can stay on zyrtec and do the nasocort samples  - Try the nasocort samples.  Remember to spray each nostril twice towards the outer part of your eye.  Do not sniff but instead pinch your nose and tilt your head back to help the medicine get into your sinuses.  The best time to do this is at bedtime.Stop if you get blurred vision or nose bleeds.   if worsening HA, changes vision/speech, imbalance, weakness go to the ER  Weight daily, if any changes in weight call the office or go to ER if worsening shortness of breath or chest pain  HOW TO TREAT VIRAL COUGH AND COLD SYMPTOMS:  -Symptoms usually last at least 1 week with the worst symptoms being around day 4.  - colds usually start with a sore throat and end with a cough, and the cough can take 2 weeks to get better.  -No antibiotics are needed for colds, flu, sore throats, cough, bronchitis UNLESS symptoms are longer than 7 days OR if you are getting better then get drastically worse.  -There are a lot of combination medications (Dayquil, Nyquil, Vicks 44, tyelnol cold and sinus, ETC). Please look at the ingredients on the back so that you are treating the correct symptoms and not doubling up on medications/ingredients.    Medicines you can use  Nasal congestion  - pseudoephedrine (Sudafed)- behind the counter, do not use if you have high blood pressure, medicine that have -D in them.  - phenylephrine (Sudafed PE) -Dextormethorphan + chlorpheniramine (Coridcidin HBP)- okay if you have high blood pressure -Oxymetazoline (Afrin) nasal spray- LIMIT to 3 days -Saline nasal spray -Neti pot (used distilled or bottled water)  Ear pain/congestion  -pseudoephedrine (sudafed) - Nasonex/flonase nasal spray  Fever  -Acetaminophen (Tyelnol) -Ibuprofen (Advil,  motrin, aleve)  Sore Throat  -Acetaminophen (Tyelnol) -Ibuprofen (Advil, motrin, aleve) -Drink a lot of water -Gargle with salt water - Rest your voice (don't talk) -Throat sprays -Cough drops  Body Aches  -Acetaminophen (Tyelnol) -Ibuprofen (Advil, motrin, aleve)  Headache  -Acetaminophen (Tyelnol) -Ibuprofen (Advil, motrin, aleve) - Exedrin, Exedrin Migraine  Allergy symptoms (cough, sneeze, runny nose, itchy eyes) -Claritin or loratadine cheapest but likely the weakest  -Zyrtec or certizine at night because it can make you sleepy -The strongest is allegra or fexafinadine  Cheapest at [employer], [employer], [employer]  Cough  -Dextromethorphan (Delsym)- medicine that has DM in it -Guafenesin (Mucinex/Robitussin) - cough drops - drink lots of water  Chest Congestion  -Guafenesin (Mucinex/Robitussin)  Red Itchy Eyes  - Naphcon-A  Upset Stomach  - Bland diet (nothing spicy, greasy, fried, and high acid foods like tomatoes, oranges, berries) -OKAY- cereal, bread, soup, crackers, rice -Eat smaller more frequent meals -reduce caffeine, no alcohol -Loperamide (Imodium-AD) if diarrhea -Prevacid for heart burn  General health when sick  -Hydration -wash your hands frequently -keep surfaces clean -change pillow cases and sheets often -Get fresh air but do not exercise strenuously -Vitamin D, double up on it - Vitamin C -Zinc

## 2017-05-26 ENCOUNTER — Telehealth: Payer: Self-pay | Admitting: Cardiology

## 2017-05-26 ENCOUNTER — Ambulatory Visit (INDEPENDENT_AMBULATORY_CARE_PROVIDER_SITE_OTHER): Payer: Medicare Other | Admitting: *Deleted

## 2017-05-26 ENCOUNTER — Ambulatory Visit (INDEPENDENT_AMBULATORY_CARE_PROVIDER_SITE_OTHER): Payer: Medicare Other

## 2017-05-26 DIAGNOSIS — I495 Sick sinus syndrome: Secondary | ICD-10-CM

## 2017-05-26 DIAGNOSIS — Z23 Encounter for immunization: Secondary | ICD-10-CM

## 2017-05-26 NOTE — Telephone Encounter (Signed)
Spoke with pt and reminded pt of remote transmission that is due today. Pt verbalized understanding.   

## 2017-05-26 NOTE — Progress Notes (Signed)
Pt presents for HD flu. HD flu was given in right deltoid w/o issues & pt checked out.

## 2017-05-27 NOTE — Progress Notes (Signed)
Remote pacemaker transmission.   

## 2017-05-31 ENCOUNTER — Encounter: Payer: Self-pay | Admitting: Cardiology

## 2017-05-31 LAB — CUP PACEART REMOTE DEVICE CHECK
Brady Statistic AP VP Percent: 18 %
Brady Statistic AP VS Percent: 65 %
Brady Statistic AS VP Percent: 1 %
Brady Statistic AS VS Percent: 16 %
Date Time Interrogation Session: 20180927161718
Implantable Lead Implant Date: 20160226
Lead Channel Impedance Value: 480 Ohm
Lead Channel Pacing Threshold Pulse Width: 0.4 ms
Lead Channel Sensing Intrinsic Amplitude: 7.9 mV
Lead Channel Setting Pacing Amplitude: 1.75 V
Lead Channel Setting Pacing Amplitude: 2.5 V
Lead Channel Setting Sensing Sensitivity: 2 mV
MDC IDC LEAD IMPLANT DT: 20160226
MDC IDC LEAD LOCATION: 753859
MDC IDC LEAD LOCATION: 753860
MDC IDC MSMT BATTERY REMAINING LONGEVITY: 122 mo
MDC IDC MSMT BATTERY REMAINING PERCENTAGE: 95.5 %
MDC IDC MSMT BATTERY VOLTAGE: 3.01 V
MDC IDC MSMT LEADCHNL RA IMPEDANCE VALUE: 530 Ohm
MDC IDC MSMT LEADCHNL RA PACING THRESHOLD AMPLITUDE: 0.75 V
MDC IDC MSMT LEADCHNL RA PACING THRESHOLD PULSEWIDTH: 0.4 ms
MDC IDC MSMT LEADCHNL RA SENSING INTR AMPL: 3.7 mV
MDC IDC MSMT LEADCHNL RV PACING THRESHOLD AMPLITUDE: 1 V
MDC IDC PG IMPLANT DT: 20160226
MDC IDC PG SERIAL: 7700661
MDC IDC SET LEADCHNL RV PACING PULSEWIDTH: 0.4 ms
MDC IDC STAT BRADY RA PERCENT PACED: 82 %
MDC IDC STAT BRADY RV PERCENT PACED: 18 %

## 2017-06-01 DIAGNOSIS — E119 Type 2 diabetes mellitus without complications: Secondary | ICD-10-CM | POA: Diagnosis not present

## 2017-06-08 ENCOUNTER — Emergency Department (HOSPITAL_COMMUNITY): Payer: Medicare Other

## 2017-06-08 ENCOUNTER — Emergency Department (HOSPITAL_COMMUNITY)
Admission: EM | Admit: 2017-06-08 | Discharge: 2017-06-08 | Disposition: A | Discharge status: Home / Self Care | Payer: Medicare Other | Attending: Emergency Medicine | Admitting: Emergency Medicine

## 2017-06-08 ENCOUNTER — Other Ambulatory Visit: Payer: Self-pay | Admitting: Internal Medicine

## 2017-06-08 ENCOUNTER — Encounter (HOSPITAL_COMMUNITY): Payer: Self-pay | Admitting: Emergency Medicine

## 2017-06-08 DIAGNOSIS — Y998 Other external cause status: Secondary | ICD-10-CM | POA: Diagnosis not present

## 2017-06-08 DIAGNOSIS — T148XXA Other injury of unspecified body region, initial encounter: Secondary | ICD-10-CM | POA: Insufficient documentation

## 2017-06-08 DIAGNOSIS — M25512 Pain in left shoulder: Secondary | ICD-10-CM | POA: Diagnosis not present

## 2017-06-08 DIAGNOSIS — Z87891 Personal history of nicotine dependence: Secondary | ICD-10-CM | POA: Diagnosis not present

## 2017-06-08 DIAGNOSIS — T07XXXA Unspecified multiple injuries, initial encounter: Secondary | ICD-10-CM

## 2017-06-08 DIAGNOSIS — I10 Essential (primary) hypertension: Secondary | ICD-10-CM | POA: Insufficient documentation

## 2017-06-08 DIAGNOSIS — Z950 Presence of cardiac pacemaker: Secondary | ICD-10-CM | POA: Insufficient documentation

## 2017-06-08 DIAGNOSIS — S24109A Unspecified injury at unspecified level of thoracic spinal cord, initial encounter: Secondary | ICD-10-CM | POA: Diagnosis not present

## 2017-06-08 DIAGNOSIS — Z79899 Other long term (current) drug therapy: Secondary | ICD-10-CM | POA: Diagnosis not present

## 2017-06-08 DIAGNOSIS — Z1231 Encounter for screening mammogram for malignant neoplasm of breast: Secondary | ICD-10-CM

## 2017-06-08 DIAGNOSIS — I480 Paroxysmal atrial fibrillation: Secondary | ICD-10-CM | POA: Insufficient documentation

## 2017-06-08 DIAGNOSIS — Z7982 Long term (current) use of aspirin: Secondary | ICD-10-CM | POA: Insufficient documentation

## 2017-06-08 DIAGNOSIS — S3992XA Unspecified injury of lower back, initial encounter: Secondary | ICD-10-CM | POA: Diagnosis not present

## 2017-06-08 DIAGNOSIS — Y92410 Unspecified street and highway as the place of occurrence of the external cause: Secondary | ICD-10-CM | POA: Diagnosis not present

## 2017-06-08 DIAGNOSIS — Y939 Activity, unspecified: Secondary | ICD-10-CM | POA: Insufficient documentation

## 2017-06-08 DIAGNOSIS — E119 Type 2 diabetes mellitus without complications: Secondary | ICD-10-CM | POA: Diagnosis not present

## 2017-06-08 DIAGNOSIS — S20222A Contusion of left back wall of thorax, initial encounter: Secondary | ICD-10-CM | POA: Diagnosis not present

## 2017-06-08 DIAGNOSIS — S0990XA Unspecified injury of head, initial encounter: Secondary | ICD-10-CM | POA: Diagnosis not present

## 2017-06-08 DIAGNOSIS — S4992XA Unspecified injury of left shoulder and upper arm, initial encounter: Secondary | ICD-10-CM | POA: Diagnosis not present

## 2017-06-08 MED ORDER — ACETAMINOPHEN ER 650 MG PO TBCR: 650.0000 mg | EXTENDED_RELEASE_TABLET | Freq: Three times a day (TID) | ORAL | 0 refills | Status: DC | PRN

## 2017-06-08 MED ORDER — OXYCODONE-ACETAMINOPHEN 5-325 MG PO TABS
1.0000 | ORAL_TABLET | Freq: Once | ORAL | Status: AC
  Administered 2017-06-08: 1 via ORAL
  Filled 2017-06-08: qty 1

## 2017-06-08 MED ORDER — ACETAMINOPHEN 325 MG PO TABS
650.0000 mg | ORAL_TABLET | Freq: Once | ORAL | Status: AC
  Administered 2017-06-08: 650 mg via ORAL
  Filled 2017-06-08: qty 2
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
## 2017-06-08 NOTE — ED Notes (Signed)
Dr. Rhunette Croft directly gave patient discharge instructions and she did not wait to sign for discharge or obtain vital signs.

## 2017-06-17 ENCOUNTER — Telehealth: Payer: Self-pay | Admitting: *Deleted

## 2017-06-17 ENCOUNTER — Telehealth: Payer: Self-pay | Admitting: Adult Health

## 2017-06-17 DIAGNOSIS — J3089 Other allergic rhinitis: Secondary | ICD-10-CM

## 2017-06-17 MED ORDER — AZELASTINE HCL 0.1 % NA SOLN: 1.0000 | Freq: Two times a day (BID) | NASAL | 1 refills | Status: DC

## 2017-06-17 NOTE — Telephone Encounter (Signed)
Patient called concerned of increased sneezing and runny nose x 2 days not relieved by OTC cold medications. Med list indicates she is on zyrtec; will have nurse verify whether she is taking this regularly, and if not start taking daily. If she is, will have her switch to allegra or claritin instead. Astalin nasal spray sent in as well to pharmacy for running nose. Will have her follow up if not better in 5-7 days.

## 2017-06-17 NOTE — Telephone Encounter (Signed)
Spoke with patient and she states she is taking Zyrtec daily, but will try OTC Allegra or Claritin.  She is aware RX has been sent in and will call back if she does not improve.

## 2017-06-20 ENCOUNTER — Encounter: Payer: Self-pay | Admitting: Adult Health

## 2017-06-20 ENCOUNTER — Ambulatory Visit (INDEPENDENT_AMBULATORY_CARE_PROVIDER_SITE_OTHER): Payer: Medicare Other | Admitting: Adult Health

## 2017-06-20 VITALS — BP 132/70 | HR 69 | Temp 97.3°F | Ht 59.0 in | Wt 173.6 lb

## 2017-06-20 DIAGNOSIS — J00 Acute nasopharyngitis [common cold]: Secondary | ICD-10-CM | POA: Diagnosis not present

## 2017-06-20 DIAGNOSIS — H6121 Impacted cerumen, right ear: Secondary | ICD-10-CM | POA: Diagnosis not present

## 2017-06-20 MED ORDER — PREDNISONE 20 MG PO TABS: ORAL_TABLET | ORAL | 0 refills | Status: DC

## 2017-06-20 MED ORDER — AZITHROMYCIN 250 MG PO TABS: ORAL_TABLET | ORAL | 1 refills | Status: AC

## 2017-06-20 NOTE — Progress Notes (Signed)
Assessment and Plan:  Tracey Todd was seen today for uri.  Diagnoses and all orders for this visit:  Acute nasopharyngitis  - Discussed the importance of avoiding unnecessary antibiotic therapy. Suggested symptomatic OTC remedies. Nasal saline spray for congestion. Nasal steroids, allergy pill, oral steroids Follow up as needed.  Printed and instructed to take only if symptoms suddenly worsen, and are not improving in another 5 days -     predniSONE (DELTASONE) 20 MG tablet; 2 tablets daily for 3 days, 1 tablet daily for 4 days. -     azithromycin (ZITHROMAX) 250 MG tablet; Take 2 tablets (500 mg) on  Day 1,  followed by 1 tablet (250 mg) once daily on Days 2 through 5.  Go to the ER if any chest pain, shortness of breath, nausea, dizziness, severe HA, changes vision/speech  Further disposition pending results of labs. Discussed med's effects and SE's.   Over 15 minutes of exam, counseling, chart review, and critical decision making was performed.   Future Appointments Date Time Provider Department Center  06/21/2017 7:40 AM [facility] MM 3 [facility]  07/01/2017 7:45 AM Helane Gunther, DPM [facility]  07/20/2017 8:45 AM Quentin Mulling, PA-C [facility] None  08/26/2017 9:55 AM [facility] DEVICE REMOTES [facility]  10/21/2017 9:30 AM Lucky Cowboy, MD [facility] None  03/06/2018 9:30 AM Allred, Fayrene Fearing, MD [facility]    ------------------------------------------------------------------------------------------------------------------   HPI BP 132/70   Pulse 69   Temp (!) 97.3 F (36.3 C)   Ht 4\' 11"  (1.499 m)   Wt 173 lb 9.6 oz (78.7 kg)   SpO2 95%   BMI 35.06 kg/m   82 y.o.female presents for nasal and chest congestion, non-productive cough, watery eyes x 3 days. She contacted the office 3 days ago via phone with concerns of increased sneezing and running nose x 2 days not relieved by OTC cold medication; patient verified to be on  daily zyrtec which she has been taking for several years. Advised to switch to claritin or allegra, and astalin prescription was provided at that time which she began to take.   She then developed a fever on Saturday 100.3 degrees farenheit, controlled by tylenol, and has some non-productive mild cough and some chest congestion. She has not had a fever since Saturday. She denies chills, fatigue/malaise, diaphoresis, ear/sinus/eye pain, changes in vision or hearing, sore throat, hoarseness, CP/SOB/wheezing, N/V/abdominal pain (endorses diarrhea consistent with chronic/baseline).    Past Medical History:  Diagnosis Date  . DJD (degenerative joint disease)   . Fatty liver   . HTN (hypertension)   . Hypercholesteremia   . Nodule of chest wall   . Paroxysmal atrial fibrillation (HCC)    a. identified on device interrogation, burden low  . Sick sinus syndrome (HCC)    a. s/p STJ dual chamber PPM   . Type II or unspecified type diabetes mellitus without mention of complication, not stated as uncontrolled   . Vitamin D deficiency      Allergies  Allergen Reactions  . Penicillins Anaphylaxis    Has patient had a PCN reaction causing immediate rash, facial/tongue/throat swelling, SOB or lightheadedness with hypotension: yes Has patient had a PCN reaction causing severe rash involving mucus membranes or skin necrosis: yes Has patient had a PCN reaction that required hospitalization: yes Has patient had a PCN reaction occurring within the last 10 years: no If all of the above answers are "NO", then may proceed with Cephalosporin use.  Gunnar Bulla [Piroxicam] Other (See Comments)  Bleeding   . Calcium-Containing Compounds Other (See Comments)    "calcium deposits on skin"  . Celebrex [Celecoxib] Itching  . Daypro [Oxaprozin] Itching    Current Outpatient Prescriptions on File Prior to Visit  Medication Sig  . acetaminophen (TYLENOL 8 HOUR) 650 MG CR tablet Take 1 tablet (650 mg total) by  mouth every 8 (eight) hours as needed for pain.  Marland Kitchen. amLODipine (NORVASC) 10 MG tablet Take 1 tablet (10 mg total) by mouth daily.  Marland Kitchen. aspirin 81 MG tablet Take 81 mg by mouth daily.  Marland Kitchen. atorvastatin (LIPITOR) 80 MG tablet Take 1/2 to 1 tablet daily or as directed for Cholesterol (Patient taking differently: Take 40 mg by mouth 2 (two) times a week. Take 40mg  by mouth on Monday and Thursday)  . azelastine (ASTELIN) 0.1 % nasal spray Place 1 spray into both nostrils 2 (two) times daily. Use in each nostril as directed  . calcium carbonate (OS-CAL) 600 MG TABS tablet Take 600 mg by mouth daily with breakfast.  . cetirizine (ZYRTEC ALLERGY) 10 MG tablet Take 10 mg by mouth every other day.   . Cholecalciferol (VITAMIN D) 2000 UNITS CAPS Take 6,000 Units by mouth daily.   Marland Kitchen. FREESTYLE LITE test strip USE ONE STRIP TO CHECK GLUCOSE ONCE DAILY  . hydrochlorothiazide (HYDRODIURIL) 25 MG tablet Take 1 tablet (25 mg total) by mouth daily.  . Lancets (FREESTYLE) lancets Check blood sugar 1 time daily-DX-E11.9.  . Magnesium 500 MG TABS Take 500 mg by mouth 4 (four) times daily.   . metFORMIN (GLUCOPHAGE-XR) 500 MG 24 hr tablet TAKE TWO TABLETS BY MOUTH TWICE DAILY (Patient taking differently: TAKE 1000MG  BY MOUTH TWICE DAILY)  . potassium chloride SA (K-DUR,KLOR-CON) 20 MEQ tablet Take 1 tablet (20 mEq total) by mouth daily.   No current facility-administered medications on file prior to visit.     ROS: all negative except above.   Physical Exam:  BP 132/70   Pulse 69   Temp (!) 97.3 F (36.3 C)   Ht 4\' 11"  (1.499 m)   Wt 173 lb 9.6 oz (78.7 kg)   SpO2 95%   BMI 35.06 kg/m   General Appearance: Well nourished, in no apparent distress. Eyes: PERRLA, EOMs, conjunctiva no swelling or erythema, bilaterally somewhat watery.  Sinuses: No Frontal/maxillary tenderness ENT/Mouth: R external auditory canal impacted with cerumen; disimpacted. L canal mostly clear with scant cerumen. Canals not  injected/macerated. TMs without erythema, bulging, but with bilateral fluid effusions. Post pharynx mildly injected but without notable swelling, or exudate.  Tonsils not swollen or erythematous. Hearing normal.  Neck: Supple, thyroid normal.  Respiratory: Respiratory effort normal, BS equal bilaterally, with scattered somewhat coarse bronchial sounds without rales, rhonchi, wheezing or stridor.  Cardio: RRR with no MRGs. Brisk peripheral pulses without edema.  Abdomen: Soft, + BS.  Non tender, no guarding, rebound, hernias, masses. Lymphatics: Non tender without lymphadenopathy.  Musculoskeletal: normal gait.  Skin: Warm, dry without rashes, lesions, ecchymosis.  Neuro:  Normal muscle tone, no cerebellar symptoms. Sensation intact.  Psych: Awake and oriented X 3, normal affect, Insight and Judgment appropriate.     Dan MakerAshley C My Madariaga, NP 12:13 PM [facility]

## 2017-06-20 NOTE — Patient Instructions (Signed)

## 2017-06-21 ENCOUNTER — Ambulatory Visit
Admission: RE | Admit: 2017-06-21 | Discharge: 2017-06-21 | Disposition: A | Discharge status: Home / Self Care | Payer: Medicare Other | Source: Ambulatory Visit | Attending: Internal Medicine | Admitting: Internal Medicine

## 2017-06-21 DIAGNOSIS — Z1231 Encounter for screening mammogram for malignant neoplasm of breast: Secondary | ICD-10-CM

## 2017-06-22 ENCOUNTER — Other Ambulatory Visit: Payer: Self-pay

## 2017-06-22 MED ORDER — AMLODIPINE BESYLATE 10 MG PO TABS: 10.0000 mg | ORAL_TABLET | Freq: Every day | ORAL | 3 refills | Status: DC

## 2017-06-24 DIAGNOSIS — H40023 Open angle with borderline findings, high risk, bilateral: Secondary | ICD-10-CM | POA: Diagnosis not present

## 2017-06-24 DIAGNOSIS — E119 Type 2 diabetes mellitus without complications: Secondary | ICD-10-CM | POA: Diagnosis not present

## 2017-06-24 DIAGNOSIS — H04123 Dry eye syndrome of bilateral lacrimal glands: Secondary | ICD-10-CM | POA: Diagnosis not present

## 2017-06-24 DIAGNOSIS — H35363 Drusen (degenerative) of macula, bilateral: Secondary | ICD-10-CM | POA: Diagnosis not present

## 2017-06-24 LAB — HM DIABETES EYE EXAM

## 2017-06-28 ENCOUNTER — Encounter: Payer: Self-pay | Admitting: Internal Medicine

## 2017-07-01 ENCOUNTER — Encounter: Payer: Self-pay | Admitting: Podiatry

## 2017-07-01 ENCOUNTER — Ambulatory Visit (INDEPENDENT_AMBULATORY_CARE_PROVIDER_SITE_OTHER): Payer: Medicare Other | Admitting: Podiatry

## 2017-07-01 DIAGNOSIS — E1121 Type 2 diabetes mellitus with diabetic nephropathy: Secondary | ICD-10-CM

## 2017-07-01 DIAGNOSIS — B351 Tinea unguium: Secondary | ICD-10-CM | POA: Diagnosis not present

## 2017-07-01 DIAGNOSIS — M2010 Hallux valgus (acquired), unspecified foot: Secondary | ICD-10-CM

## 2017-07-01 DIAGNOSIS — M79676 Pain in unspecified toe(s): Secondary | ICD-10-CM

## 2017-07-01 NOTE — Progress Notes (Signed)
Patient ID: Tracey Todd, female   DOB: 06/26/1935, 82 y.o.   MRN: [id number] Complaint:  Visit Type: Patient returns to my office for continued preventative foot care services. Complaint: Patient states" my nails have grown long and thick and become painful to walk and wear shoes" Patient has been diagnosed with DM with no foot complications... The patient presents for preventative foot care services. No changes to ROS  Podiatric Exam: Vascular: dorsalis pedis and posterior tibial pulses are palpable bilateral. Capillary return is immediate. Temperature gradient is WNL. Skin turgor WNL  Sensorium: Normal Semmes Weinstein monofilament test. Normal tactile sensation bilaterally. Nail Exam: Pt has thick disfigured discolored nails with subungual debris noted bilateral entire nail hallux through fifth toenails Ulcer Exam: There is no evidence of ulcer or pre-ulcerative changes or infection. Orthopedic Exam: Muscle tone and strength are WNL. No limitations in general ROM. No crepitus or effusions noted. Foot type and digits show no abnormalities. Mild dorsomedial exostosis noted. Skin: No Porokeratosis. No infection or ulcers  Diagnosis:  Onychomycosis, , Pain in right toe, pain in left toes  Treatment & Plan Procedures and Treatment: Consent by patient was obtained for treatment procedures. The patient understood the discussion of treatment and procedures well. All questions were answered thoroughly reviewed. Debridement of mycotic and hypertrophic toenails, 1 through 5 bilateral and clearing of subungual debris. No ulceration, no infection noted. Minimal mechanical debridement requested.  Patient is not interested in diabetic shoes. Return Visit-Office Procedure: Patient instructed to return to the office for a follow up visit 3 months for continued evaluation and treatment.    Helane GuntherGregory Daquisha Clermont DPM

## 2017-07-19 ENCOUNTER — Encounter: Payer: Self-pay | Admitting: Physician Assistant

## 2017-07-19 NOTE — Progress Notes (Signed)
MEDICARE ANNUAL WELLNESS VISIT AND FOLLOW UP  Assessment:    Congestive heart failure, unspecified HF chronicity, unspecified heart failure type (HCC) Weight stable, continue to monitor  Atherosclerosis of aorta (HCC) Control blood pressure, cholesterol, glucose, increase exercise.   PAF-fib and flutter Continue meds, no symptoms at this time  Sick sinus syndrome (HCC) Continue cardio follow up  Moderate to severe pulmonary hypertension (HCC) Control blood pressure, cholesterol, glucose, increase exercise.   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 -     Hemoglobin A1c  Controlled type 2 diabetes mellitus with diabetic nephropathy, unspecified whether long term insulin use (HCC) -     glucose blood (FREESTYLE LITE) test strip; [id number]  Check blood sugar 1 time daily. -     Hemoglobin A1c Discussed disease progression and risks Discussed diet/exercise, weight management and risk modification A1C  Hyperlipidemia -continue medications, check lipids, decrease fatty foods, increase activity.  -     Lipid panel  Medication management -     CBC with Differential/Platelet -     BASIC METABOLIC PANEL WITH GFR -     Hepatic function panel -     Magnesium  Vitamin D deficiency  Fatty liver Check labs, avoid tylenol, alcohol, weight loss advised.   Essential hypertension - continue medications, DASH diet, exercise and monitor at home. Call if greater than 130/80.  -     CBC with Differential/Platelet -     BASIC METABOLIC PANEL WITH GFR -     Hepatic function panel -     TSH  Primary osteoarthritis involving multiple joints Monitor symptoms, keep moving, weight loss advised  BMI 34.0-34.9,adult - long discussion about weight loss, diet, and  exercise -recommended diet heavy in fruits and veggies and low in animal meats, cheeses, and dairy products  Class 1 obesity due to excess calories with serious comorbidity and body mass index (BMI) of 34.0 to 34.9 in adult - follow up 3 months for progress monitoring - increase veggies, decrease carbs - long discussion about weight loss, diet, and exercise  Diabetic polyneuropathy associated with type 2 diabetes mellitus (HCC) Discussed general issues about diabetes pathophysiology and management., Educational material distributed., Suggested low cholesterol diet., Encouraged aerobic exercise., Discussed foot care., Reminded to get yearly retinal exam.  Encounter for Medicare annual wellness exam 1 year   Over 30 minutes of exam, counseling, chart review, and critical decision making was performed  Future Appointments  Date Time Provider Department Center  08/26/2017  9:55 AM [facility] DEVICE REMOTES [facility]  10/05/2017  9:00 AM Helane Gunther, DPM [facility]  10/21/2017  9:30 AM Lucky Cowboy, MD [facility] None  03/06/2018  9:30 AM Hillis Range, MD [facility]    Plan:   During the course of the visit the patient was educated and counseled about appropriate screening and preventive services including:    Pneumococcal vaccine   Influenza vaccine  Td vaccine  Prevnar 13  Screening electrocardiogram  Screening mammography  Bone densitometry screening  Colorectal cancer screening  Diabetes screening  Glaucoma screening  Nutrition counseling   Advanced directives: given info/requested copies   Subjective:   Tracey Todd is a 82 y.o. AA female who presents for Medicare Annual Wellness Visit and 3 month follow up on hypertension, prediabetes, hyperlipidemia, vitamin D def.   Her blood pressure  has been controlled at home, today their BP is BP: 122/62 She does workout. She denies chest pain, shortness of breath,  dizziness.   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 [name]. She has moderate PHTN and normal EF 55% 09/2014. Weight is steady.  Wt Readings from Last 3 Encounters:  07/20/17 172 lb 3.2 oz (78.1 kg)  06/20/17 173 lb 9.6 oz (78.7 kg)  06/08/17 175 lb (79.4 kg)   She is on cholesterol medication and denies myalgias. Her cholesterol is at goal. The cholesterol last visit was:   Lab Results  Component Value Date   CHOL 177 03/28/2017   HDL 77 03/28/2017   LDLCALC 85 03/28/2017   TRIG 74 03/28/2017   CHOLHDL 2.3 03/28/2017   She reports that her diabetes has been doing well.  Most of the time fasting blood sugars are 90-120.  She reports that she working on her diet and also taking her metformin.  Lab Results  Component Value Date   HGBA1C 6.4 (H) 03/28/2017   Last GFR Lab Results  Component Value Date   GFRAA 81 03/28/2017   Patient is on Vitamin D supplement. Lab Results  Component Value Date   VD25OH 77 03/28/2017     She had severe DDD, was following with Dr. Regino Schultze, she is living with it.  BMI is Body mass index is 34.78 kg/m., she is working on diet and exercise. Wt Readings from Last 3 Encounters:  07/20/17 172 lb 3.2 oz (78.1 kg)  06/20/17 173 lb 9.6 oz (78.7 kg)  06/08/17 175 lb (79.4 kg)    Medication Review Current Outpatient Medications on File Prior to Visit  Medication Sig Dispense Refill  . acetaminophen (TYLENOL 8 HOUR) 650 MG CR tablet Take 1 tablet (650 mg total) by mouth every 8 (eight) hours as needed for pain. 30 tablet 0  . amLODipine (NORVASC) 10 MG tablet Take 1 tablet (10 mg total) by mouth daily. 30 tablet 3  . aspirin 81 MG tablet Take 81 mg by mouth daily.    Marland Kitchen atorvastatin (LIPITOR) 80 MG tablet Take 1/2 to 1 tablet daily or as directed for Cholesterol (Patient taking differently: Take 40 mg by mouth 2 (two) times a week. Take 40mg  by mouth on Monday and Thursday) 30 tablet 1  . calcium carbonate (OS-CAL) 600  MG TABS tablet Take 600 mg by mouth daily with breakfast.    . cetirizine (ZYRTEC ALLERGY) 10 MG tablet Take 10 mg by mouth every other day.     . Cholecalciferol (VITAMIN D) 2000 UNITS CAPS Take 6,000 Units by mouth daily.     . hydrochlorothiazide (HYDRODIURIL) 25 MG tablet Take 1 tablet (25 mg total) by mouth daily. 90 tablet 3  . Lancets (FREESTYLE) lancets Check blood sugar 1 time daily-DX-E11.9. 100 each 1  . Magnesium 500 MG TABS Take 500 mg by mouth 4 (four) times daily.     . metFORMIN (GLUCOPHAGE-XR) 500 MG 24 hr tablet TAKE TWO TABLETS BY MOUTH TWICE DAILY (Patient taking differently: TAKE 1000MG  BY MOUTH TWICE DAILY) 360 tablet 2  . potassium chloride SA (K-DUR,KLOR-CON) 20 MEQ tablet Take 1 tablet (20 mEq total) by mouth daily. 90 tablet 1  . azelastine (ASTELIN) 0.1 % nasal spray Place 1 spray into both nostrils 2 (two) times daily. Use in each nostril as directed (Patient not taking: Reported on 07/20/2017) 30 mL 1  . predniSONE (DELTASONE) 20 MG tablet 2 tablets daily for  3 days, 1 tablet daily for 4 days. (Patient not taking: Reported on 07/20/2017) 10 tablet 0   No current facility-administered medications on file prior to visit.     Allergies: Allergies  Allergen Reactions  . Penicillins Anaphylaxis    Has patient had a PCN reaction causing immediate rash, facial/tongue/throat swelling, SOB or lightheadedness with hypotension: yes Has patient had a PCN reaction causing severe rash involving mucus membranes or skin necrosis: yes Has patient had a PCN reaction that required hospitalization: yes Has patient had a PCN reaction occurring within the last 10 years: no If all of the above answers are "NO", then may proceed with Cephalosporin use.  Gunnar Bulla. Feldene [Piroxicam] Other (See Comments)    Bleeding   . Calcium-Containing Compounds Other (See Comments)    "calcium deposits on skin"  . Celebrex [Celecoxib] Itching  . 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  Screening Tests Immunization History  Administered Date(s) Administered  . Influenza Split 05/23/2013  . Influenza, High Dose Seasonal PF 05/30/2014, 05/19/2015, 06/02/2016, 05/26/2017  . Pneumococcal Conjugate-13 08/06/2015  . Pneumococcal Polysaccharide-23 07/29/2013  . Td 08/31/2011    Preventative care: Last colonoscopy: 2017 Last mammogram: 05/2017 DEXA:2015 CT head 2018 Echo 09/2014 PAP 2014 declines another  Prior vaccinations: TD or Tdap: 2013  Influenza: 2018  Pneumococcal: 2014 Prevnar13: 2016 Shingles/Zostavax: declined  Names of Other Physician/Practitioners you currently use: 1. [facility]- here for primary care 2. Dr. Elmer PickerHecker, DM eye doctor, 05/2017 3. Does not see a dentist, last visit  Patient Care Team: Lucky CowboyMcKeown, William, MD as PCP - General (Internal Medicine) Mateo FlowHecker, Kathryn, MD as Consulting Physician (Ophthalmology) Jens Somrenshaw, Madolyn FriezeBrian S, MD as Consulting Physician (Cardiology) Wendall StadeNishan, Peter C, MD as Consulting Physician (Cardiology) Carman ChingEdwards, James, MD as Consulting Physician (Gastroenterology) Marily LenteSeiler, Amber K, NP as Nurse Practitioner (Cardiology)  Surgical: She  has a past surgical history that includes Tonsillectomy and adenoidectomy; Total knee arthroplasty; Breast surgery; Cataract extraction; left and right heart catheterization with coronary angiogram (N/A, 10/23/2014); and permanent pacemaker insertion (N/A, 10/25/2014). Family Her family history  includes Cancer in her father and mother; Diabetes in her mother. Social history  She reports that she quit smoking about 53 years ago. she has never used smokeless tobacco. She reports that she does not drink alcohol. Her drug history is not on file.  MEDICARE WELLNESS OBJECTIVES: Physical activity: Current Exercise Habits: The patient does not participate in regular exercise at present, Exercise limited by: orthopedic condition(s) Cardiac risk factors: Cardiac Risk Factors include: advanced age (>2055men, 23>65 women);diabetes mellitus;dyslipidemia;obesity (BMI >30kg/m2);hypertension;sedentary lifestyle Depression/mood screen:   [facility] 07/20/2017  Decreased Interest 0  Down, Depressed, Hopeless 0  PHQ - 2 Score 0    ADLs:  In your present state of health, do you have any difficulty performing the following activities: 07/20/2017 03/27/2017  Hearing? N N  Vision? N N  Difficulty concentrating or making decisions? N N  Walking or climbing stairs? Y N  Dressing or bathing? N N  Doing errands, shopping? Y N  Some recent data might be hidden     Cognitive  Testing  Alert? Yes  Normal Appearance?Yes  Oriented to person? Yes  Place? Yes   Time? Yes  Recall of three objects?  Yes  Can perform simple calculations? Yes  Displays appropriate judgment?Yes  Can read the correct time from a watch face?Yes  EOL planning: Does Patient Have a Medical Advance Directive?: No Would patient like information on creating a medical advance directive?: No - Patient declined(patient has the papers and is completeing at home)   Objective:   Today's Vitals   07/20/17 0853  BP: 122/62  Pulse: 67  Temp: 97.9 F (36.6 C)  SpO2: 97%  Weight: 172 lb 3.2 oz (78.1 kg)  Height: 4\' 11"  (1.499 m)   Body mass index is 34.78 kg/m.  General appearance: alert, no distress, WD/WN,  female HEENT: normocephalic, sclerae anicteric, TMs pearly, nares patent, no discharge or erythema, pharynx  normal Oral cavity: MMM, no lesions Neck: supple, no lymphadenopathy, no thyromegaly, no masses Heart: RRR, normal S1, S2, no murmurs Lungs: CTA bilaterally, no wheezes, rhonchi, or rales Abdomen: +bs, soft, non tender, non distended, no masses, no hepatomegaly, no splenomegaly Musculoskeletal: nontender, no swelling, no obvious deformity Extremities: no edema, no cyanosis, no clubbing Pulses: 2+ symmetric, upper and lower extremities, normal cap refill Neurological: alert, oriented x 3, CN2-12 intact, strength normal upper extremities and lower extremities, sensation normal throughout, DTRs 2+ throughout, no cerebellar signs, gait normal Psychiatric: normal affect, behavior normal, pleasant  Breast: defer Gyn: defer Rectal: defer   Medicare Attestation I have personally reviewed: The patient's medical and social history Their use of alcohol, tobacco or illicit drugs Their current medications and supplements The patient's functional ability including ADLs,fall risks, home safety risks, cognitive, and hearing and visual impairment Diet and physical activities Evidence for depression or mood disorders  The patient's weight, height, BMI, and visual acuity have been recorded in the chart.  I have made referrals, counseling, and provided education to the patient based on review of the above and I have provided the patient with a written personalized care plan for preventive services.     Quentin Mullingmanda Aiyonna Lucado, PA-C   07/20/2017

## 2017-07-20 ENCOUNTER — Ambulatory Visit (INDEPENDENT_AMBULATORY_CARE_PROVIDER_SITE_OTHER): Payer: Medicare Other | Admitting: Physician Assistant

## 2017-07-20 ENCOUNTER — Other Ambulatory Visit: Payer: Self-pay | Admitting: Physician Assistant

## 2017-07-20 ENCOUNTER — Encounter: Payer: Self-pay | Admitting: Physician Assistant

## 2017-07-20 VITALS — BP 122/62 | HR 67 | Temp 97.9°F | Ht 59.0 in | Wt 172.2 lb

## 2017-07-20 DIAGNOSIS — I700 Atherosclerosis of aorta: Secondary | ICD-10-CM | POA: Insufficient documentation

## 2017-07-20 DIAGNOSIS — K760 Fatty (change of) liver, not elsewhere classified: Secondary | ICD-10-CM | POA: Diagnosis not present

## 2017-07-20 DIAGNOSIS — E66811 Obesity, class 1: Secondary | ICD-10-CM

## 2017-07-20 DIAGNOSIS — Z0001 Encounter for general adult medical examination with abnormal findings: Secondary | ICD-10-CM

## 2017-07-20 DIAGNOSIS — I509 Heart failure, unspecified: Secondary | ICD-10-CM

## 2017-07-20 DIAGNOSIS — M150 Primary generalized (osteo)arthritis: Secondary | ICD-10-CM

## 2017-07-20 DIAGNOSIS — E1121 Type 2 diabetes mellitus with diabetic nephropathy: Secondary | ICD-10-CM | POA: Diagnosis not present

## 2017-07-20 DIAGNOSIS — I272 Pulmonary hypertension, unspecified: Secondary | ICD-10-CM | POA: Diagnosis not present

## 2017-07-20 DIAGNOSIS — E782 Mixed hyperlipidemia: Secondary | ICD-10-CM

## 2017-07-20 DIAGNOSIS — I495 Sick sinus syndrome: Secondary | ICD-10-CM

## 2017-07-20 DIAGNOSIS — I480 Paroxysmal atrial fibrillation: Secondary | ICD-10-CM | POA: Diagnosis not present

## 2017-07-20 DIAGNOSIS — Z79899 Other long term (current) drug therapy: Secondary | ICD-10-CM | POA: Diagnosis not present

## 2017-07-20 DIAGNOSIS — Z0000 Encounter for general adult medical examination without abnormal findings: Secondary | ICD-10-CM

## 2017-07-20 DIAGNOSIS — M159 Polyosteoarthritis, unspecified: Secondary | ICD-10-CM

## 2017-07-20 DIAGNOSIS — M8949 Other hypertrophic osteoarthropathy, multiple sites: Secondary | ICD-10-CM

## 2017-07-20 DIAGNOSIS — E559 Vitamin D deficiency, unspecified: Secondary | ICD-10-CM

## 2017-07-20 DIAGNOSIS — E1142 Type 2 diabetes mellitus with diabetic polyneuropathy: Secondary | ICD-10-CM

## 2017-07-20 DIAGNOSIS — E1140 Type 2 diabetes mellitus with diabetic neuropathy, unspecified: Secondary | ICD-10-CM | POA: Insufficient documentation

## 2017-07-20 DIAGNOSIS — R6889 Other general symptoms and signs: Secondary | ICD-10-CM | POA: Diagnosis not present

## 2017-07-20 DIAGNOSIS — I10 Essential (primary) hypertension: Secondary | ICD-10-CM | POA: Diagnosis not present

## 2017-07-20 DIAGNOSIS — Z6834 Body mass index (BMI) 34.0-34.9, adult: Secondary | ICD-10-CM

## 2017-07-20 DIAGNOSIS — E6609 Other obesity due to excess calories: Secondary | ICD-10-CM

## 2017-07-20 MED ORDER — GLUCOSE BLOOD VI STRP: ORAL_STRIP | 1 refills | Status: DC

## 2017-07-20 NOTE — Patient Instructions (Signed)
Your ears and sinuses are connected by the eustachian tube. When your sinuses are inflamed, this can close off the tube and cause fluid to collect in your middle ear. This can then cause dizziness, popping, clicking, ringing, and echoing in your ears. This is often NOT an infection and does NOT require antibiotics, it is caused by inflammation so the treatments help the inflammation. This can take a long time to get better so please be patient.  Here are things you can do to help with this: - Try the Flonase or Nasonex. Remember to spray each nostril twice towards the outer part of your eye.  Do not sniff but instead pinch your nose and tilt your head back to help the medicine get into your sinuses.  The best time to do this is at bedtime.Stop if you get blurred vision or nose bleeds.  -While drinking fluids, pinch and hold nose close and swallow, to help open eustachian tubes to drain fluid behind ear drums. -Please pick one of the over the counter allergy medications below and take it once daily for allergies.  It will also help with fluid behind ear drums. Claritin or loratadine cheapest but likely the weakest  Zyrtec or certizine at night because it can make you sleepy The strongest is allegra or fexafinadine  Cheapest at [employer], [employer], [employer] -can use decongestant over the counter, please do not use if you have high blood pressure or certain heart conditions.   if worsening HA, changes vision/speech, imbalance, weakness go to the ER     Bad carbs also include fruit juice, alcohol, and sweet tea. These are empty calories that do not signal to your brain that you are full.   Please remember the good carbs are still carbs which convert into sugar. So please measure them out no more than 1/2-1 cup of rice, oatmeal, pasta, and beans  Veggies are however free foods! Pile them on.   Not all fruit is created equal. Please see the list below, the fruit at the bottom is higher in sugars than the  fruit at the top. Please avoid all dried fruits.     

## 2017-07-21 LAB — BASIC METABOLIC PANEL WITH GFR
BUN: 18 mg/dL (ref 7–25)
CO2: 30 mmol/L (ref 20–32)
CREATININE: 0.77 mg/dL (ref 0.60–0.88)
Calcium: 9.9 mg/dL (ref 8.6–10.4)
Chloride: 102 mmol/L (ref 98–110)
GFR, EST NON AFRICAN AMERICAN: 73 mL/min/{1.73_m2} (ref >=60)
GFR, Est African American: 85 mL/min/{1.73_m2} (ref >=60)
GLUCOSE: 86 mg/dL (ref 65–99)
Potassium: 4.3 mmol/L (ref 3.5–5.3)
SODIUM: 139 mmol/L (ref 135–146)

## 2017-07-21 LAB — HEPATIC FUNCTION PANEL
AG RATIO: 1.7 (calc) (ref 1.0–2.5)
ALBUMIN MSPROF: 4.3 g/dL (ref 3.6–5.1)
ALKALINE PHOSPHATASE (APISO): 52 U/L (ref 33–130)
ALT: 6 U/L (ref 6–29)
AST: 14 U/L (ref 10–35)
Bilirubin, Direct: 0.1 mg/dL (ref 0.0–0.2)
GLOBULIN: 2.6 g/dL (ref 1.9–3.7)
Indirect Bilirubin: 0.4 mg/dL (calc) (ref 0.2–1.2)
TOTAL PROTEIN: 6.9 g/dL (ref 6.1–8.1)
Total Bilirubin: 0.5 mg/dL (ref 0.2–1.2)

## 2017-07-21 LAB — CBC WITH DIFFERENTIAL/PLATELET
BASOS ABS: 67 {cells}/uL (ref 0–200)
BASOS PCT: 1.1 %
EOS ABS: 189 {cells}/uL (ref 15–500)
EOS PCT: 3.1 %
HEMATOCRIT: 36.2 % (ref 35.0–45.0)
HEMOGLOBIN: 11.7 g/dL (ref 11.7–15.5)
Lymphs Abs: 2056 cells/uL (ref 850–3900)
MCH: 27.1 pg (ref 27.0–33.0)
MCHC: 32.3 g/dL (ref 32.0–36.0)
MCV: 84 fL (ref 80.0–100.0)
MPV: 10 fL (ref 7.5–12.5)
Monocytes Relative: 12.2 %
NEUTROS ABS: 3044 {cells}/uL (ref 1500–7800)
Neutrophils Relative %: 49.9 %
Platelets: 269 10*3/uL (ref 140–400)
RBC: 4.31 10*6/uL (ref 3.80–5.10)
RDW: 14.1 % (ref 11.0–15.0)
Total Lymphocyte: 33.7 %
WBC mixed population: 744 cells/uL (ref 200–950)
WBC: 6.1 10*3/uL (ref 3.8–10.8)

## 2017-07-21 LAB — MAGNESIUM: Magnesium: 1.8 mg/dL (ref 1.5–2.5)

## 2017-07-21 LAB — LIPID PANEL
CHOL/HDL RATIO: 1.9 (calc) (ref <5.0)
CHOLESTEROL: 147 mg/dL (ref <200)
HDL: 79 mg/dL (ref >50)
LDL CHOLESTEROL (CALC): 55 mg/dL
NON-HDL CHOLESTEROL (CALC): 68 mg/dL (ref <130)
TRIGLYCERIDES: 53 mg/dL (ref <150)

## 2017-07-21 LAB — HEMOGLOBIN A1C
EAG (MMOL/L): 7.6 (calc)
Hgb A1c MFr Bld: 6.4 % of total Hgb — ABNORMAL HIGH (ref <5.7)
MEAN PLASMA GLUCOSE: 137 (calc)

## 2017-07-21 LAB — TSH: TSH: 3.28 mIU/L (ref 0.40–4.50)

## 2017-07-25 ENCOUNTER — Other Ambulatory Visit: Payer: Self-pay

## 2017-07-25 DIAGNOSIS — E1121 Type 2 diabetes mellitus with diabetic nephropathy: Secondary | ICD-10-CM

## 2017-07-25 MED ORDER — FREESTYLE LITE DEVI: 0 refills | Status: DC

## 2017-08-15 ENCOUNTER — Other Ambulatory Visit: Payer: Self-pay | Admitting: Internal Medicine

## 2017-08-17 ENCOUNTER — Other Ambulatory Visit: Payer: Self-pay

## 2017-08-17 DIAGNOSIS — E1121 Type 2 diabetes mellitus with diabetic nephropathy: Secondary | ICD-10-CM

## 2017-08-17 DIAGNOSIS — E119 Type 2 diabetes mellitus without complications: Secondary | ICD-10-CM | POA: Diagnosis not present

## 2017-08-17 MED ORDER — FREESTYLE LANCETS MISC: 1 refills | Status: DC

## 2017-08-17 MED ORDER — GLUCOSE BLOOD VI STRP: ORAL_STRIP | 1 refills | Status: DC

## 2017-08-26 ENCOUNTER — Ambulatory Visit (INDEPENDENT_AMBULATORY_CARE_PROVIDER_SITE_OTHER): Payer: Medicare Other | Admitting: *Deleted

## 2017-08-26 DIAGNOSIS — I495 Sick sinus syndrome: Secondary | ICD-10-CM | POA: Diagnosis not present

## 2017-08-26 NOTE — Progress Notes (Signed)
Remote pacemaker transmission.   

## 2017-08-29 ENCOUNTER — Encounter: Payer: Self-pay | Admitting: Cardiology

## 2017-09-06 DIAGNOSIS — E1121 Type 2 diabetes mellitus with diabetic nephropathy: Secondary | ICD-10-CM | POA: Diagnosis not present

## 2017-09-13 LAB — CUP PACEART REMOTE DEVICE CHECK
Battery Remaining Percentage: 95.5 %
Brady Statistic AP VS Percent: 64 %
Brady Statistic AS VS Percent: 20 %
Brady Statistic RV Percent Paced: 15 %
Date Time Interrogation Session: 20181228070014
Implantable Lead Implant Date: 20160226
Implantable Lead Location: 753860
Lead Channel Impedance Value: 440 Ohm
Lead Channel Pacing Threshold Amplitude: 1 V
Lead Channel Pacing Threshold Pulse Width: 0.4 ms
Lead Channel Sensing Intrinsic Amplitude: 4 mV
Lead Channel Setting Pacing Amplitude: 2.5 V
Lead Channel Setting Pacing Pulse Width: 0.4 ms
Lead Channel Setting Sensing Sensitivity: 2 mV
MDC IDC LEAD IMPLANT DT: 20160226
MDC IDC LEAD LOCATION: 753859
MDC IDC MSMT BATTERY REMAINING LONGEVITY: 120 mo
MDC IDC MSMT BATTERY VOLTAGE: 3.01 V
MDC IDC MSMT LEADCHNL RA PACING THRESHOLD AMPLITUDE: 0.625 V
MDC IDC MSMT LEADCHNL RA PACING THRESHOLD PULSEWIDTH: 0.4 ms
MDC IDC MSMT LEADCHNL RV IMPEDANCE VALUE: 480 Ohm
MDC IDC MSMT LEADCHNL RV SENSING INTR AMPL: 7.9 mV
MDC IDC PG IMPLANT DT: 20160226
MDC IDC PG SERIAL: 7700661
MDC IDC SET LEADCHNL RA PACING AMPLITUDE: 1.625
MDC IDC STAT BRADY AP VP PERCENT: 15 %
MDC IDC STAT BRADY AS VP PERCENT: 1 %
MDC IDC STAT BRADY RA PERCENT PACED: 78 %

## 2017-10-05 ENCOUNTER — Ambulatory Visit (INDEPENDENT_AMBULATORY_CARE_PROVIDER_SITE_OTHER): Payer: Medicare Other | Admitting: Podiatry

## 2017-10-05 ENCOUNTER — Encounter: Payer: Self-pay | Admitting: Podiatry

## 2017-10-05 DIAGNOSIS — B351 Tinea unguium: Secondary | ICD-10-CM

## 2017-10-05 DIAGNOSIS — E1121 Type 2 diabetes mellitus with diabetic nephropathy: Secondary | ICD-10-CM | POA: Diagnosis not present

## 2017-10-05 DIAGNOSIS — M79676 Pain in unspecified toe(s): Secondary | ICD-10-CM | POA: Diagnosis not present

## 2017-10-05 NOTE — Progress Notes (Addendum)
Patient ID: Tracey Todd StaffMary C Lessley, female   DOB: 07/26/1935, 83 y.o.   MRN: [id number] Complaint:  Visit Type: Patient returns to my office for continued preventative foot care services. Complaint: Patient states" my nails have grown long and thick and become painful to walk and wear shoes" Patient has been diagnosed with DM with no foot complications... The patient presents for preventative foot care services. No changes to ROS  Podiatric Exam: Vascular: dorsalis pedis and posterior tibial pulses are palpable bilateral. Capillary return is immediate. Temperature gradient is WNL. Skin turgor WNL  Sensorium: Diminished  Semmes Weinstein monofilament test. Normal tactile sensation bilaterally. Nail Exam: Pt has thick disfigured discolored nails with subungual debris noted bilateral entire nail hallux through fifth toenails Ulcer Exam: There is no evidence of ulcer or pre-ulcerative changes or infection. Orthopedic Exam: Muscle tone and strength are WNL. No limitations in general ROM. No crepitus or effusions noted. Foot type and digits show no abnormalities. Mild dorsomedial exostosis noted. Skin: No Porokeratosis. No infection or ulcers  Diagnosis:  Onychomycosis, , Pain in right toe, pain in left toes  Treatment & Plan Procedures and Treatment: Consent by patient was obtained for treatment procedures. The patient understood the discussion of treatment and procedures well. All questions were answered thoroughly reviewed. Debridement of mycotic and hypertrophic toenails, 1 through 5 bilateral and clearing of subungual debris. No ulceration, no infection noted. Minimal mechanical debridement requested.  . Return Visit-Office Procedure: Patient instructed to return to the office for a follow up visit 3 months for continued evaluation and treatment.    Helane GuntherGregory Maragret Vanacker DPM

## 2017-10-10 ENCOUNTER — Other Ambulatory Visit: Payer: Self-pay | Admitting: Physician Assistant

## 2017-10-18 ENCOUNTER — Other Ambulatory Visit: Payer: Self-pay | Admitting: Adult Health

## 2017-10-21 ENCOUNTER — Encounter: Payer: Self-pay | Admitting: Internal Medicine

## 2017-10-21 ENCOUNTER — Ambulatory Visit: Payer: Medicare Other | Admitting: Internal Medicine

## 2017-10-21 VITALS — BP 136/70 | HR 68 | Temp 97.7°F | Ht 59.0 in | Wt 174.8 lb

## 2017-10-21 DIAGNOSIS — Z79899 Other long term (current) drug therapy: Secondary | ICD-10-CM

## 2017-10-21 DIAGNOSIS — E559 Vitamin D deficiency, unspecified: Secondary | ICD-10-CM | POA: Diagnosis not present

## 2017-10-21 DIAGNOSIS — N182 Chronic kidney disease, stage 2 (mild): Secondary | ICD-10-CM

## 2017-10-21 DIAGNOSIS — I10 Essential (primary) hypertension: Secondary | ICD-10-CM | POA: Diagnosis not present

## 2017-10-21 DIAGNOSIS — E782 Mixed hyperlipidemia: Secondary | ICD-10-CM | POA: Diagnosis not present

## 2017-10-21 DIAGNOSIS — E1122 Type 2 diabetes mellitus with diabetic chronic kidney disease: Secondary | ICD-10-CM | POA: Diagnosis not present

## 2017-10-21 NOTE — Progress Notes (Signed)
This very nice 83 y.o.  WBF presents for 6 month follow up with HTN, ASHD, HLD, T2_DM  and Vitamin D Deficiency. She also has been followed by Dr Regino Schultze for DDD & EDSI's.     Patient is treated for HTN (1990's) & BP has been controlled at home. Today's BP is at goal - 136/70.  In Feb 2016, she had a PPM for SSS and heart cath showed Nl Coronary arteries.  Patient has had no complaints of any cardiac type chest pain, palpitations, dyspnea / orthopnea / PND, dizziness, claudication, or dependent edema.     Hyperlipidemia is controlled with diet & meds. Patient denies myalgias or other med SE's. Last Lipids were  Lab Results  Component Value Date   CHOL 171 10/21/2017   HDL 72 10/21/2017   LDLCALC 85 03/28/2017   TRIG 78 10/21/2017   CHOLHDL 2.4 10/21/2017      Also, the patient has history of Morbid Obesity (BMI 35+) and consequent T2_NIDDM (1997)  w/CKD2 and has had no symptoms of reactive hypoglycemia, diabetic polys, paresthesias or visual blurring.  She alleges FBG's range betw 90-110 mg%. Last A1c was not at goal: Lab Results  Component Value Date   HGBA1C 6.4 (H) 10/21/2017      Further, the patient also has history of Vitamin D Deficiency and supplements vitamin D without any suspected side-effects. Last vitamin D was at goal:   Lab Results  Component Value Date   VD25OH 63 10/21/2017   Current Outpatient Medications on File Prior to Visit  Medication Sig  . acetaminophen (TYLENOL 8 HOUR) 650 MG CR tablet Take 1 tablet (650 mg total) by mouth every 8 (eight) hours as needed for pain.  Marland Kitchen amLODipine (NORVASC) 10 MG tablet TAKE 1 TABLET BY MOUTH ONCE DAILY  . aspirin 81 MG tablet Take 81 mg by mouth daily.  Marland Kitchen atorvastatin (LIPITOR) 80 MG tablet Take 1/2 to 1 tablet daily or as directed for Cholesterol (Patient taking differently: Take 40 mg by mouth 2 (two) times a week. Take 40mg  by mouth on Monday and Thursday)  . Blood Glucose Monitoring Suppl (FREESTYLE LITE) DEVI Use device  to test blood daily up to 4 times a day  . calcium carbonate (OS-CAL) 600 MG TABS tablet Take 600 mg by mouth daily with breakfast.  . cetirizine (ZYRTEC ALLERGY) 10 MG tablet Take 10 mg by mouth every other day.   . Cholecalciferol (VITAMIN D) 2000 UNITS CAPS Take 6,000 Units by mouth daily.   Marland Kitchen glucose blood (FREESTYLE LITE) test strip DX-E11.9Check blood sugar 1 time daily.  . hydrochlorothiazide (HYDRODIURIL) 25 MG tablet Take 1 tablet (25 mg total) by mouth daily.  . Lancets (FREESTYLE) lancets USE 1  TO CHECK GLUCOSE ONCE DAILY Dx. E11.9  . Magnesium 500 MG TABS Take 500 mg by mouth 4 (four) times daily.   . metFORMIN (GLUCOPHAGE-XR) 500 MG 24 hr tablet TAKE TWO TABLETS BY MOUTH TWICE DAILY (Patient taking differently: TAKE 1000MG  BY MOUTH TWICE DAILY)  . potassium chloride SA (K-DUR,KLOR-CON) 20 MEQ tablet TAKE 1 TABLET BY MOUTH ONCE DAILY   No current facility-administered medications on file prior to visit.    Allergies  Allergen Reactions  . Penicillins Anaphylaxis    Has patient had a PCN reaction causing immediate rash, facial/tongue/throat swelling, SOB or lightheadedness with hypotension: yes Has patient had a PCN reaction causing severe rash involving mucus membranes or skin necrosis: yes Has patient had a PCN reaction  that required hospitalization: yes Has patient had a PCN reaction occurring within the last 10 years: no If all of the above answers are "NO", then may proceed with Cephalosporin use.  Tracey Todd. Feldene [Piroxicam] Other (See Comments)    Bleeding   . Calcium-Containing Compounds Other (See Comments)    "calcium deposits on skin"  . Celebrex [Celecoxib] Itching  . Daypro [Oxaprozin] Itching   PMHx:   Past Medical History:  Diagnosis Date  . DJD (degenerative joint disease)   . Fatty liver   . 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 HTN (hypertension)   . Hypercholesteremia   . Nodule of chest wall   . Normal coronary arteries and LVF 10/23/14 10/24/2014  .  Pacemaker implanted 10/25/14- St Jude 10/26/2014  . Paroxysmal atrial fibrillation (HCC)    a. identified on device interrogation, burden low  . Sick sinus syndrome (HCC)    a. s/p STJ dual chamber PPM   . Type II or unspecified type diabetes mellitus without mention of complication, not stated as uncontrolled   . Vitamin D deficiency    Immunization History  Administered Date(s) Administered  . Influenza Split 05/23/2013  . Influenza, High Dose Seasonal PF 05/30/2014, 05/19/2015, 06/02/2016, 05/26/2017  . Pneumococcal Conjugate-13 08/06/2015  . Pneumococcal Polysaccharide-23 07/29/2013  . Td 08/31/2011   Past Surgical History:  Procedure Laterality Date  . BREAST SURGERY    . CATARACT EXTRACTION    . LEFT AND RIGHT HEART CATHETERIZATION WITH CORONARY ANGIOGRAM N/A 10/23/2014   Procedure: LEFT AND RIGHT HEART CATHETERIZATION WITH CORONARY ANGIOGRAM;  Surgeon: Micheline ChapmanMichael D Cooper, MD;  [facility];  Service: Cardiovascular;  Laterality: N/A;  . PERMANENT PACEMAKER INSERTION N/A 10/25/2014   STJ dual chamber PPM implanted by Dr Johney FrameAllred for SSS  . TONSILLECTOMY AND ADENOIDECTOMY    . TOTAL KNEE ARTHROPLASTY     FHx:    Reviewed / unchanged  SHx:    Reviewed / unchanged  Systems Review:  Constitutional: Denies fever, chills, wt changes, headaches, insomnia, fatigue, night sweats, change in appetite. Eyes: Denies redness, blurred vision, diplopia, discharge, itchy, watery eyes.  ENT: Denies discharge, congestion, post nasal drip, epistaxis, sore throat, earache, hearing loss, dental pain, tinnitus, vertigo, sinus pain, snoring.  CV: Denies chest pain, palpitations, irregular heartbeat, syncope, dyspnea, diaphoresis, orthopnea, PND, claudication or edema. Respiratory: denies cough, dyspnea, DOE, pleurisy, hoarseness, laryngitis, wheezing.  Gastrointestinal: Denies dysphagia, odynophagia, heartburn, reflux, water brash, abdominal pain or cramps, nausea, vomiting, bloating, diarrhea,  constipation, hematemesis, melena, hematochezia  or hemorrhoids. Genitourinary: Denies dysuria, frequency, urgency, nocturia, hesitancy, discharge, hematuria or flank pain. Musculoskeletal: Denies arthralgias, myalgias, stiffness, jt. swelling, pain, limping or strain/sprain.  Skin: Denies pruritus, rash, hives, warts, acne, eczema or change in skin lesion(s). Neuro: No weakness, tremor, incoordination, spasms, paresthesia or pain. Psychiatric: Denies confusion, memory loss or sensory loss. Endo: Denies change in weight, skin or hair change.  Heme/Lymph: No excessive bleeding, bruising or enlarged lymph nodes.  Physical Exam  BP 136/70   Pulse 68   Temp 97.7 F (36.5 C)   Ht 4\' 11"  (1.499 m)   Wt 174 lb 12.8 oz (79.3 kg)   SpO2 96%   BMI 35.31 kg/m   Appears  well nourished, well groomed  and in no distress.  Eyes: PERRLA, EOMs, conjunctiva no swelling or erythema. Sinuses: No frontal/maxillary tenderness ENT/Mouth: EAC's clear, TM's nl w/o erythema, bulging. Nares clear w/o erythema, swelling, exudates. Oropharynx clear without erythema or exudates. Oral hygiene is good.  Tongue normal, non obstructing. Hearing intact.  Neck: Supple. Thyroid not palpable. Car 2+/2+ without bruits, nodes or JVD. Chest: Respirations nl with BS clear & equal w/o rales, rhonchi, wheezing or stridor.  Cor: Heart sounds normal w/ regular rate and rhythm without sig. murmurs, gallops, clicks or rubs. Peripheral pulses normal and equal  without edema.  Abdomen: Soft & bowel sounds normal. Non-tender w/o guarding, rebound, hernias, masses or organomegaly.  Lymphatics: Unremarkable.  Musculoskeletal: Full ROM all peripheral extremities, joint stability, 5/5 strength and normal gait.  Skin: Warm, dry without exposed rashes, lesions or ecchymosis apparent.  Neuro: Cranial nerves intact, reflexes equal bilaterally. Sensory-motor testing grossly intact. Tendon reflexes grossly intact.  Pysch: Alert & oriented x  3.  Insight and judgement nl & appropriate. No ideations.  Assessment and Plan:  1. Essential hypertension  - Continue medication, monitor blood pressure at home.  - Continue DASH diet. Reminder to go to the ER if any CP,  SOB, nausea, dizziness, severe HA, changes vision/speech.  - CBC with Differential/Platelet - BASIC METABOLIC PANEL WITH GFR - Magnesium - TSH  2. Hyperlipidemia, mixed  - Continue diet/meds, exercise,& lifestyle modifications.  - Continue monitor periodic cholesterol/liver & renal functions   - Hepatic function panel - Lipid panel - TSH  3. Type 2 diabetes mellitus with stage 2 chronic kidney disease, without long-term current use of insulin (HCC)  - Continue diet, exercise, lifestyle modifications.  - Monitor appropriate labs.  - Hemoglobin A1c - Insulin, random  4. Vitamin D deficiency  - Continue supplementation.   - VITAMIN D 25 Hydroxy   5. Medication management  - CBC with Differential/Platelet - BASIC METABOLIC PANEL WITH GFR - Hepatic function panel - Magnesium - Lipid panel - TSH - Hemoglobin A1c - Insulin, random - VITAMIN D 25 Hydroxy       D  iscussed  regular exercise, BP monitoring, weight control to achieve/maintain BMI less than 25 and discussed med and SE's. Recommended labs to assess and monitor clinical status with further disposition pending results of labs. Over 30 minutes of exam, counseling, chart review was performed.

## 2017-10-21 NOTE — Patient Instructions (Signed)

## 2017-10-23 ENCOUNTER — Encounter: Payer: Self-pay | Admitting: Internal Medicine

## 2017-10-24 LAB — BASIC METABOLIC PANEL WITH GFR
BUN: 19 mg/dL (ref 7–25)
CHLORIDE: 102 mmol/L (ref 98–110)
CO2: 28 mmol/L (ref 20–32)
Calcium: 10.2 mg/dL (ref 8.6–10.4)
Creat: 0.68 mg/dL (ref 0.60–0.88)
GFR, EST NON AFRICAN AMERICAN: 83 mL/min/{1.73_m2} (ref >=60)
GFR, Est African American: 96 mL/min/{1.73_m2} (ref >=60)
Glucose, Bld: 103 mg/dL — ABNORMAL HIGH (ref 65–99)
POTASSIUM: 4.3 mmol/L (ref 3.5–5.3)
SODIUM: 140 mmol/L (ref 135–146)

## 2017-10-24 LAB — HEPATIC FUNCTION PANEL
AG Ratio: 1.8 (calc) (ref 1.0–2.5)
ALT: 7 U/L (ref 6–29)
AST: 13 U/L (ref 10–35)
Albumin: 4.5 g/dL (ref 3.6–5.1)
Alkaline phosphatase (APISO): 58 U/L (ref 33–130)
BILIRUBIN DIRECT: 0.1 mg/dL (ref 0.0–0.2)
BILIRUBIN TOTAL: 0.5 mg/dL (ref 0.2–1.2)
Globulin: 2.5 g/dL (calc) (ref 1.9–3.7)
Indirect Bilirubin: 0.4 mg/dL (calc) (ref 0.2–1.2)
Total Protein: 7 g/dL (ref 6.1–8.1)

## 2017-10-24 LAB — VITAMIN D 25 HYDROXY (VIT D DEFICIENCY, FRACTURES): VIT D 25 HYDROXY: 63 ng/mL (ref 30–100)

## 2017-10-24 LAB — LIPID PANEL
Cholesterol: 171 mg/dL (ref <200)
HDL: 72 mg/dL (ref >50)
LDL Cholesterol (Calc): 83 mg/dL (calc)
NON-HDL CHOLESTEROL (CALC): 99 mg/dL (ref <130)
Total CHOL/HDL Ratio: 2.4 (calc) (ref <5.0)
Triglycerides: 78 mg/dL (ref <150)

## 2017-10-24 LAB — CBC WITH DIFFERENTIAL/PLATELET
BASOS ABS: 78 {cells}/uL (ref 0–200)
Basophils Relative: 1.2 %
EOS PCT: 2.2 %
Eosinophils Absolute: 143 cells/uL (ref 15–500)
HEMATOCRIT: 36.2 % (ref 35.0–45.0)
Hemoglobin: 12 g/dL (ref 11.7–15.5)
LYMPHS ABS: 2529 {cells}/uL (ref 850–3900)
MCH: 28 pg (ref 27.0–33.0)
MCHC: 33.1 g/dL (ref 32.0–36.0)
MCV: 84.4 fL (ref 80.0–100.0)
MONOS PCT: 11.7 %
MPV: 9.7 fL (ref 7.5–12.5)
NEUTROS PCT: 46 %
Neutro Abs: 2990 cells/uL (ref 1500–7800)
PLATELETS: 288 10*3/uL (ref 140–400)
RBC: 4.29 10*6/uL (ref 3.80–5.10)
RDW: 14.4 % (ref 11.0–15.0)
TOTAL LYMPHOCYTE: 38.9 %
WBC mixed population: 761 cells/uL (ref 200–950)
WBC: 6.5 10*3/uL (ref 3.8–10.8)

## 2017-10-24 LAB — INSULIN, RANDOM: Insulin: 7.7 u[IU]/mL (ref 2.0–19.6)

## 2017-10-24 LAB — MAGNESIUM: Magnesium: 1.9 mg/dL (ref 1.5–2.5)

## 2017-10-24 LAB — HEMOGLOBIN A1C
EAG (MMOL/L): 7.6 (calc)
Hgb A1c MFr Bld: 6.4 % of total Hgb — ABNORMAL HIGH (ref <5.7)
MEAN PLASMA GLUCOSE: 137 (calc)

## 2017-10-24 LAB — TSH: TSH: 2.6 m[IU]/L (ref 0.40–4.50)

## 2017-11-29 ENCOUNTER — Ambulatory Visit (INDEPENDENT_AMBULATORY_CARE_PROVIDER_SITE_OTHER): Payer: Medicare Other | Admitting: *Deleted

## 2017-11-29 DIAGNOSIS — I495 Sick sinus syndrome: Secondary | ICD-10-CM | POA: Diagnosis not present

## 2017-11-29 NOTE — Progress Notes (Signed)
Remote pacemaker transmission.   

## 2017-11-30 ENCOUNTER — Encounter: Payer: Self-pay | Admitting: Cardiology

## 2017-12-04 DIAGNOSIS — E119 Type 2 diabetes mellitus without complications: Secondary | ICD-10-CM | POA: Diagnosis not present

## 2017-12-06 ENCOUNTER — Other Ambulatory Visit: Payer: Self-pay | Admitting: Internal Medicine

## 2017-12-06 DIAGNOSIS — E1142 Type 2 diabetes mellitus with diabetic polyneuropathy: Secondary | ICD-10-CM

## 2017-12-06 MED ORDER — GABAPENTIN 100 MG PO CAPS: ORAL_CAPSULE | ORAL | 2 refills | Status: DC

## 2017-12-07 ENCOUNTER — Telehealth: Payer: Self-pay | Admitting: *Deleted

## 2017-12-07 NOTE — Telephone Encounter (Signed)
Patient called and requested an RX for Gabapentin for lg pain. Dr Oneta RackMcKeown sent in an RX for Gabapentin 100 mg 1-2 tablet up to 3 times a day.  The patient was advised to start the RX slowly at 1 capsule daily and increase as tolerated.  She was advised she will need an OV if the pain persist.

## 2017-12-15 DIAGNOSIS — H40023 Open angle with borderline findings, high risk, bilateral: Secondary | ICD-10-CM | POA: Diagnosis not present

## 2017-12-15 DIAGNOSIS — E1121 Type 2 diabetes mellitus with diabetic nephropathy: Secondary | ICD-10-CM | POA: Diagnosis not present

## 2017-12-22 LAB — CUP PACEART REMOTE DEVICE CHECK
Battery Remaining Longevity: 119 mo
Brady Statistic AS VP Percent: 1 %
Brady Statistic RA Percent Paced: 78 %
Date Time Interrogation Session: 20190402073037
Implantable Lead Implant Date: 20160226
Implantable Lead Location: 753859
Implantable Lead Model: 1948
Lead Channel Impedance Value: 430 Ohm
Lead Channel Pacing Threshold Pulse Width: 0.4 ms
Lead Channel Sensing Intrinsic Amplitude: 5.2 mV
Lead Channel Setting Pacing Amplitude: 1.625
Lead Channel Setting Sensing Sensitivity: 2 mV
MDC IDC LEAD IMPLANT DT: 20160226
MDC IDC LEAD LOCATION: 753860
MDC IDC MSMT BATTERY REMAINING PERCENTAGE: 95.5 %
MDC IDC MSMT BATTERY VOLTAGE: 3.01 V
MDC IDC MSMT LEADCHNL RA PACING THRESHOLD AMPLITUDE: 0.625 V
MDC IDC MSMT LEADCHNL RA SENSING INTR AMPL: 2.1 mV
MDC IDC MSMT LEADCHNL RV IMPEDANCE VALUE: 450 Ohm
MDC IDC MSMT LEADCHNL RV PACING THRESHOLD AMPLITUDE: 1 V
MDC IDC MSMT LEADCHNL RV PACING THRESHOLD PULSEWIDTH: 0.4 ms
MDC IDC PG IMPLANT DT: 20160226
MDC IDC SET LEADCHNL RV PACING AMPLITUDE: 2.5 V
MDC IDC SET LEADCHNL RV PACING PULSEWIDTH: 0.4 ms
MDC IDC STAT BRADY AP VP PERCENT: 15 %
MDC IDC STAT BRADY AP VS PERCENT: 63 %
MDC IDC STAT BRADY AS VS PERCENT: 21 %
MDC IDC STAT BRADY RV PERCENT PACED: 16 %
Pulse Gen Model: 2240
Pulse Gen Serial Number: 7700661

## 2017-12-26 DIAGNOSIS — M8589 Other specified disorders of bone density and structure, multiple sites: Secondary | ICD-10-CM | POA: Diagnosis not present

## 2017-12-26 LAB — HM DEXA SCAN

## 2018-01-03 ENCOUNTER — Encounter: Payer: Self-pay | Admitting: *Deleted

## 2018-01-04 ENCOUNTER — Other Ambulatory Visit: Payer: Self-pay | Admitting: Physician Assistant

## 2018-01-04 ENCOUNTER — Encounter: Payer: Self-pay | Admitting: Podiatry

## 2018-01-04 ENCOUNTER — Ambulatory Visit (INDEPENDENT_AMBULATORY_CARE_PROVIDER_SITE_OTHER): Payer: Medicare Other | Admitting: Podiatry

## 2018-01-04 DIAGNOSIS — M79676 Pain in unspecified toe(s): Secondary | ICD-10-CM

## 2018-01-04 DIAGNOSIS — B351 Tinea unguium: Secondary | ICD-10-CM | POA: Diagnosis not present

## 2018-01-04 DIAGNOSIS — E1121 Type 2 diabetes mellitus with diabetic nephropathy: Secondary | ICD-10-CM

## 2018-01-04 DIAGNOSIS — M2010 Hallux valgus (acquired), unspecified foot: Secondary | ICD-10-CM

## 2018-01-04 NOTE — Progress Notes (Signed)
Patient ID: Tracey Todd, female   DOB: 12-26-1934, 83 y.o.   MRN: [id number] Complaint:  Visit Type: Patient returns to my office for continued preventative foot care services. Complaint: Patient states" my nails have grown long and thick and become painful to walk and wear shoes" Patient has been diagnosed with DM with no foot complications... The patient presents for preventative foot care services. No changes to ROS  Podiatric Exam: Vascular: dorsalis pedis and posterior tibial pulses are palpable bilateral. Capillary return is immediate. Temperature gradient is WNL. Skin turgor WNL  Sensorium: Diminished  Semmes Weinstein monofilament test. Normal tactile sensation bilaterally. Nail Exam: Pt has thick disfigured discolored nails with subungual debris noted bilateral entire nail hallux through fifth toenails Ulcer Exam: There is no evidence of ulcer or pre-ulcerative changes or infection. Orthopedic Exam: Muscle tone and strength are WNL. No limitations in general ROM. No crepitus or effusions noted. Foot type and digits show no abnormalities. Mild dorsomedial exostosis noted. Skin: No Porokeratosis. No infection or ulcers  Diagnosis:  Onychomycosis, , Pain in right toe, pain in left toes  Treatment & Plan Procedures and Treatment: Consent by patient was obtained for treatment procedures. The patient understood the discussion of treatment and procedures well. All questions were answered thoroughly reviewed. Debridement of mycotic and hypertrophic toenails, 1 through 5 bilateral and clearing of subungual debris. No ulceration, no infection noted. Minimal mechanical debridement requested.  . Return Visit-Office Procedure: Patient instructed to return to the office for a follow up visit 3 months for continued evaluation and treatment.    Helane Gunther DPM

## 2018-01-05 ENCOUNTER — Telehealth: Payer: Self-pay | Admitting: *Deleted

## 2018-01-05 NOTE — Telephone Encounter (Signed)
Patient called and stastes she has been taking Gabapentin 100 mg capsules for a month, feels better and asked if she can stop the medication.  Per Dr Oneta Rack, the medication is probably the reason she feels better, but she can stop the medication and see how she feels.  The patient is aware.

## 2018-01-26 ENCOUNTER — Other Ambulatory Visit: Payer: Self-pay | Admitting: Internal Medicine

## 2018-01-26 ENCOUNTER — Telehealth: Payer: Self-pay | Admitting: *Deleted

## 2018-01-26 NOTE — Telephone Encounter (Signed)
The patient was advised the letter she requested regarding her mail being delivered to her porch, will be mailed to the patient.  Per Dr  Oneta Rack, the patient was advised she needs to contact [facility] if she is not going to change her PCP to them.  If she does want them to become here PCP, we will send them her records and close her chart here.  The patient didi not agree to calling them and states she will talk to Dr Oneta Rack about this at her next OV.  The patient was advised to decline their services, if they contact her before her next OV here.

## 2018-02-03 DIAGNOSIS — E119 Type 2 diabetes mellitus without complications: Secondary | ICD-10-CM | POA: Diagnosis not present

## 2018-02-23 NOTE — Progress Notes (Signed)
FOLLOW UP  Assessment and Plan:   Pulmonary hypertension Symptoms stable, continue cardiology follow up  CHF Weights stable Emphasized salt restriction, less than 2000mg  a day. Encouraged daily monitoring of the patient's weight, call office if 3 lb weight loss or gain in a day.  Encouraged regular exercise. If any increasing shortness of breath, swelling, or chest pressure go to ER immediately.  decrease your fluid intake to less than 2 L daily please remember to always increase your potassium intake with any increase of your fluid pill.   Hypertension Well controlled with current medications  Monitor blood pressure at home; patient to call if consistently greater than 130/80 Continue DASH diet.   Reminder to go to the ER if any CP, SOB, nausea, dizziness, severe HA, changes vision/speech, left arm numbness and tingling and jaw pain.  Cholesterol Currently at goal;  Continue low cholesterol diet and exercise.  Check lipid panel.   Diabetes with diabetic chronic kidney disease Continue medication: metformin Continue diet and exercise.  Perform daily foot/skin check, notify office of any concerning changes.  Check A1C  Obesity with co morbidities Long discussion about weight loss, diet, and exercise Recommended diet heavy in fruits and veggies and low in animal meats, cheeses, and dairy products, appropriate calorie intake Discussed ideal weight for height and initial weight goal (170 lb) Patient will work on watching portion cise Will follow up in 3 months  Vitamin D Def At goal at last visit; continue supplementation to maintain goal of 70-100 Defer Vit D level  Continue diet and meds as discussed. Further disposition pending results of labs. Discussed med's effects and SE's.   Over 30 minutes of exam, counseling, chart review, and critical decision making was performed.   Future Appointments  Date Time Provider Department Center  02/28/2018  7:35 AM [facility] DEVICE  REMOTES [facility]  03/06/2018  9:30 AM Hillis RangeAllred, James, MD [facility]  04/07/2018  8:00 AM Helane GuntherMayer, Gregory, DPM [facility]  06/02/2018  9:00 AM Lucky CowboyMcKeown, William, MD [facility] None    ----------------------------------------------------------------------------------------------------------------------  HPI 83 y.o. female  presents for 3 month follow up on hypertension, cholesterol, diabetes, obesity and vitamin D deficiency. She has sick sinus syndrome s/p St. Jude pacemaker in 2016, CHF, pulmonary htn, Followed by cardiology.  BMI is Body mass index is 35.14 kg/m., she has been working on diet and exercise (goes to the pool twice weekly). She has been eating oatmeal, eggs and spinach for breakfast, bakes her vegetables, eats broccoli/collard greens, has pinto beans. Eats brown bread, minimal rice, etc. Drinks mainly water, rarely pepsi. Does enjoy her sweets - slice of pound cake, once a week  She has a history of CHF, pulmonary hypertension , denies dyspnea on exertion, orthopnea, paroxysmal nocturnal dyspnea and edema. Positive for none. Wt Readings from Last 3 Encounters:  02/24/18 174 lb (78.9 kg)  10/21/17 174 lb 12.8 oz (79.3 kg)  07/20/17 172 lb 3.2 oz (78.1 kg)   Her blood pressure has been controlled at home, today their BP is BP: 114/60  She does workout. She denies chest pain, shortness of breath, dizziness.   She is on cholesterol medication (atorvastatin 40 mg twice weekly) and denies myalgias. Her cholesterol is not at goal. The cholesterol last visit was:   Lab Results  Component Value Date   CHOL 171 10/21/2017   HDL 72 10/21/2017   LDLCALC 83 10/21/2017   TRIG 78 10/21/2017   CHOLHDL 2.4 10/21/2017    She has been working  on diet and exercise for T2 diabetes on metformin, and denies foot ulcerations, increased appetite, nausea, polydipsia, polyuria, visual disturbances, vomiting and weight loss. Last A1C in the office was:  Lab  Results  Component Value Date   HGBA1C 6.4 (H) 10/21/2017   Patient is on Vitamin D supplement.   Lab Results  Component Value Date   VD25OH 63 10/21/2017        Current Medications:  Current Outpatient Medications on File Prior to Visit  Medication Sig  . amLODipine (NORVASC) 10 MG tablet TAKE 1 TABLET BY MOUTH ONCE DAILY  . aspirin 81 MG tablet Take 81 mg by mouth daily.  Marland Kitchen atorvastatin (LIPITOR) 80 MG tablet Take 1/2 to 1 tablet daily or as directed for Cholesterol (Patient taking differently: Take 40 mg by mouth 2 (two) times a week. Take 40mg  by mouth on Monday and Thursday)  . Blood Glucose Monitoring Suppl (FREESTYLE LITE) DEVI Use device to test blood daily up to 4 times a day  . calcium carbonate (OS-CAL) 600 MG TABS tablet Take 600 mg by mouth daily with breakfast.  . cetirizine (ZYRTEC ALLERGY) 10 MG tablet Take 10 mg by mouth every other day.   . Cholecalciferol (VITAMIN D) 2000 UNITS CAPS Take 6,000 Units by mouth daily.   . Cyanocobalamin (VITAMIN B-12 PO) Take by mouth.  . gabapentin (NEURONTIN) 100 MG capsule Take 1 to 2 capsules 3 x / day for Neuropathy Pains  . glucose blood (FREESTYLE LITE) test strip DX-E11.9Check blood sugar 1 time daily.  . hydrochlorothiazide (HYDRODIURIL) 25 MG tablet Take 1 tablet (25 mg total) by mouth daily.  . Lancets (FREESTYLE) lancets USE 1  TO CHECK GLUCOSE ONCE DAILY Dx. E11.9  . Magnesium 500 MG TABS Take 500 mg by mouth 4 (four) times daily.   . potassium chloride SA (K-DUR,KLOR-CON) 20 MEQ tablet TAKE 1 TABLET BY MOUTH ONCE DAILY  . acetaminophen (TYLENOL 8 HOUR) 650 MG CR tablet Take 1 tablet (650 mg total) by mouth every 8 (eight) hours as needed for pain. (Patient not taking: Reported on 02/24/2018)  . metFORMIN (GLUCOPHAGE-XR) 500 MG 24 hr tablet TAKE 1000MG  BY MOUTH TWICE DAILY   No current facility-administered medications on file prior to visit.      Allergies:  Allergies  Allergen Reactions  . Penicillins Anaphylaxis   . Feldene [Piroxicam] Other (See Comments)    Bleeding   . Calcium-Containing Compounds Other (See Comments)    "calcium deposits on skin"  . Celebrex [Celecoxib] Itching  . Daypro [Oxaprozin] Itching     Medical History:  Past Medical History:  Diagnosis Date  . DJD (degenerative joint disease)   . Fatty liver   . 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 HTN (hypertension)   . Hypercholesteremia   . Nodule of chest wall   . Normal coronary arteries and LVF 10/23/14 10/24/2014  . Pacemaker implanted 10/25/14- St Jude 10/26/2014  . Paroxysmal atrial fibrillation (HCC)    a. identified on device interrogation, burden low  . Sick sinus syndrome (HCC)    a. s/p STJ dual chamber PPM   . Type II or unspecified type diabetes mellitus without mention of complication, not stated as uncontrolled   . Vitamin D deficiency    Family history- Reviewed and unchanged Social history- Reviewed and unchanged   Review of Systems:  Review of Systems  Constitutional: Negative for malaise/fatigue and weight loss.  HENT: Negative for hearing loss and tinnitus.   Eyes: Negative for blurred  vision and double vision.  Respiratory: Negative for cough, shortness of breath and wheezing.   Cardiovascular: Negative for chest pain, palpitations, orthopnea, claudication and leg swelling.  Gastrointestinal: Negative for abdominal pain, blood in stool, constipation, diarrhea, heartburn, melena, nausea and vomiting.  Genitourinary: Negative.   Musculoskeletal: Negative for joint pain and myalgias.  Skin: Negative for rash.  Neurological: Negative for dizziness, tingling, sensory change, weakness and headaches.  Endo/Heme/Allergies: Negative for polydipsia.  Psychiatric/Behavioral: Negative.   All other systems reviewed and are negative.   Physical Exam: BP 114/60   Pulse 60   Temp (!) 97.3 F (36.3 C)   Ht 4\' 11"  (1.499 m)   Wt 174 lb (78.9 kg)   SpO2 93%   BMI 35.14 kg/m  Wt Readings from Last 3  Encounters:  02/24/18 174 lb (78.9 kg)  10/21/17 174 lb 12.8 oz (79.3 kg)  07/20/17 172 lb 3.2 oz (78.1 kg)   General Appearance: Well nourished, in no apparent distress. Eyes: PERRLA, EOMs, conjunctiva no swelling or erythema Sinuses: No Frontal/maxillary tenderness ENT/Mouth: Ext aud canals clear, TMs without erythema, bulging. No erythema, swelling, or exudate on post pharynx.  Tonsils not swollen or erythematous. Hearing normal.  Neck: Supple, thyroid normal.  Respiratory: Respiratory effort normal, BS equal bilaterally without rales, rhonchi, wheezing or stridor.  Cardio: RRR with no MRGs. Brisk peripheral pulses without edema.  Abdomen: Soft, + BS.  Non tender, no guarding, rebound, hernias, masses. Lymphatics: Non tender without lymphadenopathy.  Musculoskeletal: Full ROM, 5/5 strength, Normal gait Skin: Warm, dry without rashes, lesions, ecchymosis.  Neuro: Cranial nerves intact. No cerebellar symptoms.  Psych: Awake and oriented X 3, normal affect, Insight and Judgment appropriate.    Dan Maker, NP 9:04 AM [facility]

## 2018-02-24 ENCOUNTER — Encounter: Payer: Self-pay | Admitting: Adult Health

## 2018-02-24 ENCOUNTER — Ambulatory Visit (INDEPENDENT_AMBULATORY_CARE_PROVIDER_SITE_OTHER): Payer: Medicare Other | Admitting: Adult Health

## 2018-02-24 VITALS — BP 114/60 | HR 60 | Temp 97.3°F | Ht 59.0 in | Wt 174.0 lb

## 2018-02-24 DIAGNOSIS — I272 Pulmonary hypertension, unspecified: Secondary | ICD-10-CM | POA: Diagnosis not present

## 2018-02-24 DIAGNOSIS — E1121 Type 2 diabetes mellitus with diabetic nephropathy: Secondary | ICD-10-CM

## 2018-02-24 DIAGNOSIS — I10 Essential (primary) hypertension: Secondary | ICD-10-CM | POA: Diagnosis not present

## 2018-02-24 DIAGNOSIS — K760 Fatty (change of) liver, not elsewhere classified: Secondary | ICD-10-CM

## 2018-02-24 DIAGNOSIS — Z79899 Other long term (current) drug therapy: Secondary | ICD-10-CM

## 2018-02-24 DIAGNOSIS — I509 Heart failure, unspecified: Secondary | ICD-10-CM | POA: Diagnosis not present

## 2018-02-24 DIAGNOSIS — E782 Mixed hyperlipidemia: Secondary | ICD-10-CM

## 2018-02-24 DIAGNOSIS — Z6834 Body mass index (BMI) 34.0-34.9, adult: Secondary | ICD-10-CM | POA: Diagnosis not present

## 2018-02-24 DIAGNOSIS — E559 Vitamin D deficiency, unspecified: Secondary | ICD-10-CM

## 2018-02-24 DIAGNOSIS — E6609 Other obesity due to excess calories: Secondary | ICD-10-CM

## 2018-02-24 NOTE — Patient Instructions (Addendum)
  Aim for 7+ servings of fruits and vegetables daily  64+ fluid ounces of water or unsweet tea for healthy kidneys  Avoid alcohol and tobacco  Limit animal fats in diet for cholesterol and heart health - choose grass fed whenever available  Aim for low stress - take time to unwind and care for your mental health  Aim for 150 min of moderate intensity exercise weekly for heart health, and weights twice weekly for bone health  Aim for 7-9 hours of sleep daily     Drink 1/2 your body weight in fluid ounces of water daily; drink a tall glass of water 30 min before meals  Don't eat until you're stuffed- listen to your stomach and eat until you are 80% full   Try eating off of a salad plate; wait 10 min after finishing before going back for seconds  Start by eating the vegetables on your plate; aim for 16%50% of your meals to be fruits or vegetables  Then eat your protein - lean meats (grass fed if possible), fish, beans, nuts in moderation  Eat your carbs/starch last ONLY if you still are hungry. If you can, stop before finishing it all  Avoid sugar and flour - the closer it looks to it's original form in nature, typically the better it is for you  Splurge in moderation - "assign" days when you get to splurge and have the "bad stuff" - I like to follow a 80% - 20% plan- "good" choices 80 % of the time, "bad" choices in moderation 20% of the time  Simple equation is: Calories out > calories in = weight loss - even if you eat the bad stuff, if you limit portions, you will still lose weight   Recommend "mixing it up" - your body will adjust to a new routine within just a few weeks. Change up your exercise and your diet/intake regularly, try to have some variation between days to keep your body guessing -

## 2018-02-25 LAB — COMPLETE METABOLIC PANEL WITH GFR
AG Ratio: 1.6 (calc) (ref 1.0–2.5)
ALT: 7 U/L (ref 6–29)
AST: 14 U/L (ref 10–35)
Albumin: 4.2 g/dL (ref 3.6–5.1)
Alkaline phosphatase (APISO): 52 U/L (ref 33–130)
BUN: 15 mg/dL (ref 7–25)
CO2: 29 mmol/L (ref 20–32)
CREATININE: 0.69 mg/dL (ref 0.60–0.88)
Calcium: 9.7 mg/dL (ref 8.6–10.4)
Chloride: 102 mmol/L (ref 98–110)
GFR, EST AFRICAN AMERICAN: 95 mL/min/{1.73_m2} (ref >=60)
GFR, EST NON AFRICAN AMERICAN: 82 mL/min/{1.73_m2} (ref >=60)
GLOBULIN: 2.7 g/dL (ref 1.9–3.7)
GLUCOSE: 107 mg/dL — AB (ref 65–99)
POTASSIUM: 4.2 mmol/L (ref 3.5–5.3)
SODIUM: 141 mmol/L (ref 135–146)
Total Bilirubin: 0.6 mg/dL (ref 0.2–1.2)
Total Protein: 6.9 g/dL (ref 6.1–8.1)

## 2018-02-25 LAB — MAGNESIUM: Magnesium: 2 mg/dL (ref 1.5–2.5)

## 2018-02-25 LAB — CBC WITH DIFFERENTIAL/PLATELET
BASOS PCT: 0.9 %
Basophils Absolute: 52 cells/uL (ref 0–200)
EOS ABS: 162 {cells}/uL (ref 15–500)
Eosinophils Relative: 2.8 %
HEMATOCRIT: 37.1 % (ref 35.0–45.0)
Hemoglobin: 11.8 g/dL (ref 11.7–15.5)
LYMPHS ABS: 1879 {cells}/uL (ref 850–3900)
MCH: 26.8 pg — AB (ref 27.0–33.0)
MCHC: 31.8 g/dL — ABNORMAL LOW (ref 32.0–36.0)
MCV: 84.1 fL (ref 80.0–100.0)
MPV: 9.8 fL (ref 7.5–12.5)
Monocytes Relative: 11 %
NEUTROS PCT: 52.9 %
Neutro Abs: 3068 cells/uL (ref 1500–7800)
Platelets: 303 10*3/uL (ref 140–400)
RBC: 4.41 10*6/uL (ref 3.80–5.10)
RDW: 14.4 % (ref 11.0–15.0)
TOTAL LYMPHOCYTE: 32.4 %
WBC: 5.8 10*3/uL (ref 3.8–10.8)
WBCMIX: 638 {cells}/uL (ref 200–950)

## 2018-02-25 LAB — TSH: TSH: 2.49 mIU/L (ref 0.40–4.50)

## 2018-02-25 LAB — HEMOGLOBIN A1C
EAG (MMOL/L): 7.9 (calc)
Hgb A1c MFr Bld: 6.6 % of total Hgb — ABNORMAL HIGH (ref <5.7)
MEAN PLASMA GLUCOSE: 143 (calc)

## 2018-02-25 LAB — LIPID PANEL
CHOL/HDL RATIO: 2.5 (calc) (ref <5.0)
CHOLESTEROL: 163 mg/dL (ref <200)
HDL: 66 mg/dL (ref >50)
LDL CHOLESTEROL (CALC): 81 mg/dL
NON-HDL CHOLESTEROL (CALC): 97 mg/dL (ref <130)
Triglycerides: 82 mg/dL (ref <150)

## 2018-02-27 ENCOUNTER — Other Ambulatory Visit: Payer: Self-pay | Admitting: *Deleted

## 2018-02-27 DIAGNOSIS — E1142 Type 2 diabetes mellitus with diabetic polyneuropathy: Secondary | ICD-10-CM

## 2018-02-27 MED ORDER — GABAPENTIN 100 MG PO CAPS: ORAL_CAPSULE | ORAL | 2 refills | Status: DC

## 2018-02-28 ENCOUNTER — Ambulatory Visit (INDEPENDENT_AMBULATORY_CARE_PROVIDER_SITE_OTHER): Payer: Medicare Other | Admitting: *Deleted

## 2018-02-28 DIAGNOSIS — R001 Bradycardia, unspecified: Secondary | ICD-10-CM

## 2018-02-28 NOTE — Progress Notes (Signed)
Remote pacemaker transmission.   

## 2018-03-06 ENCOUNTER — Encounter: Payer: Self-pay | Admitting: Internal Medicine

## 2018-03-06 ENCOUNTER — Ambulatory Visit: Payer: Medicare Other | Admitting: Internal Medicine

## 2018-03-06 ENCOUNTER — Encounter (INDEPENDENT_AMBULATORY_CARE_PROVIDER_SITE_OTHER): Payer: Self-pay

## 2018-03-06 VITALS — BP 126/62 | HR 64 | Ht 59.0 in | Wt 176.0 lb

## 2018-03-06 DIAGNOSIS — I495 Sick sinus syndrome: Secondary | ICD-10-CM

## 2018-03-06 DIAGNOSIS — I480 Paroxysmal atrial fibrillation: Secondary | ICD-10-CM | POA: Diagnosis not present

## 2018-03-06 DIAGNOSIS — Z950 Presence of cardiac pacemaker: Secondary | ICD-10-CM | POA: Diagnosis not present

## 2018-03-06 LAB — CUP PACEART INCLINIC DEVICE CHECK
Battery Remaining Longevity: 124 mo
Battery Voltage: 3.01 V
Brady Statistic RA Percent Paced: 78 %
Brady Statistic RV Percent Paced: 18 %
Implantable Lead Implant Date: 20160226
Implantable Lead Location: 753859
Implantable Lead Model: 1948
Lead Channel Impedance Value: 450 Ohm
Lead Channel Pacing Threshold Amplitude: 0.75 V
Lead Channel Pacing Threshold Pulse Width: 0.4 ms
Lead Channel Sensing Intrinsic Amplitude: 3.1 mV
Lead Channel Setting Pacing Amplitude: 1.625
Lead Channel Setting Pacing Pulse Width: 0.4 ms
Lead Channel Setting Sensing Sensitivity: 2 mV
MDC IDC LEAD IMPLANT DT: 20160226
MDC IDC LEAD LOCATION: 753860
MDC IDC MSMT LEADCHNL RA PACING THRESHOLD PULSEWIDTH: 0.4 ms
MDC IDC MSMT LEADCHNL RV IMPEDANCE VALUE: 487.5 Ohm
MDC IDC MSMT LEADCHNL RV PACING THRESHOLD AMPLITUDE: 1.25 V
MDC IDC MSMT LEADCHNL RV SENSING INTR AMPL: 8.1 mV
MDC IDC PG IMPLANT DT: 20160226
MDC IDC PG SERIAL: 7700661
MDC IDC SESS DTM: 20190708132844
MDC IDC SET LEADCHNL RV PACING AMPLITUDE: 2.5 V
Pulse Gen Model: 2240

## 2018-03-06 NOTE — Progress Notes (Signed)
PCP: Lucky CowboyMcKeown, William, MD Primary Cardiologist: Dr Elease HashimotoNahser Primary EP:  Dr Fabienne BrunsAllred  Tracey Todd is a 83 y.o. female who presents today for routine electrophysiology followup.  Since last being seen in our clinic, the patient reports doing very well.  Today, she denies symptoms of palpitations, chest pain, shortness of breath,  lower extremity edema, dizziness, presyncope, or syncope.  The patient is otherwise without complaint today.   Past Medical History:  Diagnosis Date  . DJD (degenerative joint disease)   . Fatty liver   . 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 HTN (hypertension)   . Hypercholesteremia   . Nodule of chest wall   . Normal coronary arteries and LVF 10/23/14 10/24/2014  . Pacemaker implanted 10/25/14- St Jude 10/26/2014  . Paroxysmal atrial fibrillation (HCC)    a. identified on device interrogation, burden low  . Sick sinus syndrome (HCC)    a. s/p STJ dual chamber PPM   . Type II or unspecified type diabetes mellitus without mention of complication, not stated as uncontrolled   . Vitamin D deficiency    Past Surgical History:  Procedure Laterality Date  . BREAST SURGERY    . CATARACT EXTRACTION    . LEFT AND RIGHT HEART CATHETERIZATION WITH CORONARY ANGIOGRAM N/A 10/23/2014   Procedure: LEFT AND RIGHT HEART CATHETERIZATION WITH CORONARY ANGIOGRAM;  Surgeon: Micheline ChapmanMichael D Cooper, MD;  [facility];  Service: Cardiovascular;  Laterality: N/A;  . PERMANENT PACEMAKER INSERTION N/A 10/25/2014   STJ dual chamber PPM implanted by Dr Johney FrameAllred for SSS  . TONSILLECTOMY AND ADENOIDECTOMY    . TOTAL KNEE ARTHROPLASTY      ROS- all systems are reviewed and negative except as per HPI above  Current Outpatient Medications  Medication Sig Dispense Refill  . amLODipine (NORVASC) 10 MG tablet TAKE 1 TABLET BY MOUTH ONCE DAILY 90 tablet 2  . aspirin 81 MG tablet Take 81 mg by mouth daily.    Marland Kitchen. atorvastatin (LIPITOR) 80 MG tablet Take 1/2 to 1 tablet daily or as  directed for Cholesterol (Patient taking differently: Take 40 mg by mouth 2 (two) times a week. Take 40mg  by mouth on Monday and Thursday) 30 tablet 1  . Blood Glucose Monitoring Suppl (FREESTYLE LITE) DEVI Use device to test blood daily up to 4 times a day 1 each 0  . calcium carbonate (OS-CAL) 600 MG TABS tablet Take 600 mg by mouth daily with breakfast.    . cetirizine (ZYRTEC ALLERGY) 10 MG tablet Take 10 mg by mouth every other day.     . Cholecalciferol (VITAMIN D) 2000 UNITS CAPS Take 6,000 Units by mouth daily.     . Cyanocobalamin (VITAMIN B-12 PO) Take by mouth.    . gabapentin (NEURONTIN) 100 MG capsule Take 1 to 2 capsules 3 x / day for Neuropathy Pains 90 capsule 2  . glucose blood (FREESTYLE LITE) test strip DX-E11.9Check blood sugar 1 time daily. 50 each 1  . hydrochlorothiazide (HYDRODIURIL) 25 MG tablet Take 1 tablet (25 mg total) by mouth daily. 90 tablet 3  . Lancets (FREESTYLE) lancets USE 1  TO CHECK GLUCOSE ONCE DAILY Dx. E11.9 50 each 1  . Magnesium 500 MG TABS Take 500 mg by mouth 4 (four) times daily.     . metFORMIN (GLUCOPHAGE-XR) 500 MG 24 hr tablet TAKE 1000MG  BY MOUTH TWICE DAILY 360 tablet 2  . potassium chloride SA (K-DUR,KLOR-CON) 20 MEQ tablet TAKE 1 TABLET BY MOUTH ONCE DAILY 90  tablet 1   No current facility-administered medications for this visit.     Physical Exam: Vitals:   03/06/18 0946  BP: 126/62  Pulse: 64  Weight: 176 lb (79.8 kg)  Height: 4\' 11"  (1.499 m)    GEN- The patient is well appearing, alert and oriented x 3 today.   Head- normocephalic, atraumatic Eyes-  Sclera clear, conjunctiva pink Ears- hearing intact Oropharynx- clear Lungs- Clear to ausculation bilaterally, normal work of breathing Chest- pacemaker pocket is well healed Heart- Regular rate and rhythm, no murmurs, rubs or gallops, PMI not laterally displaced GI- soft, NT, ND, + BS Extremities- no clubbing, cyanosis, or edema  Pacemaker interrogation- reviewed in detail  today,  See PACEART report  ekg tracing ordered today is personally reviewed and shows atrial pacing 64 bpm, PR 296 msec, QRS 84 msec, Qtc 408 msec, nonspecific ST/T changes  Assessment and Plan:  1. Symptomatic sinus bradycardia  Normal pacemaker function See Pace Art report No changes today  2. Overweight Body mass index is 35.55 kg/m. Wt Readings from Last 3 Encounters:  03/06/18 176 lb (79.8 kg)  02/24/18 174 lb (78.9 kg)  10/21/17 174 lb 12.8 oz (79.3 kg)   3. Paroxysmal atrialfibrillation <1%, loongest episode 5 hours chads2vasc score is 5.  She wishes to avoid OAC at this time but may consider if her afib burden increases  4. HTN Stable No change required today   Follow-up: merlin Return to see EP NP in 1 year  Hillis Range MD, [facility] 03/06/2018 10:28 AM

## 2018-03-06 NOTE — Patient Instructions (Addendum)
Medication Instructions:  Your physician recommends that you continue on your current medications as directed. Please refer to the Current Medication list given to you today.  Labwork: None ordered.  Testing/Procedures: None ordered.  Follow-Up: Your physician wants you to follow-up in: one year with Gypsy BalsamAmber Seiler, NP.   You will receive a reminder letter in the mail two months in advance. If you don't receive a letter, please call our office to schedule the follow-up appointment.  Remote monitoring is used to monitor your Pacemaker from home. This monitoring reduces the number of office visits required to check your device to one time per year. It allows us to keep an eye on the functioning of your device to ensure it is working properly. You are scheduled for a device check from home on 05/30/2018. You may send your transmission at any time that day. If you have a wireless device, the transmission will be sent automatically. After your physician reviews your transmission, you will receive a postcard with your next transmission date.  Any Other Special Instructions Will Be Listed Below (If Applicable).  If you need a refill on your cardiac medications before your next appointment, please call your pharmacy.

## 2018-03-08 ENCOUNTER — Other Ambulatory Visit: Payer: Self-pay

## 2018-03-08 ENCOUNTER — Other Ambulatory Visit: Payer: Self-pay | Admitting: *Deleted

## 2018-03-08 ENCOUNTER — Other Ambulatory Visit: Payer: Self-pay | Admitting: Physician Assistant

## 2018-03-08 MED ORDER — FREESTYLE LANCETS MISC: 1 refills | Status: DC

## 2018-03-08 NOTE — Telephone Encounter (Signed)
Pharmacy requesting ICD 10 code for patient's prescription for the freestyle lancets

## 2018-03-10 DIAGNOSIS — E1121 Type 2 diabetes mellitus with diabetic nephropathy: Secondary | ICD-10-CM | POA: Diagnosis not present

## 2018-03-18 LAB — CUP PACEART REMOTE DEVICE CHECK
Battery Remaining Longevity: 120 mo
Battery Remaining Percentage: 95.5 %
Battery Voltage: 3.01 V
Brady Statistic AP VP Percent: 17 %
Brady Statistic RA Percent Paced: 78 %
Brady Statistic RV Percent Paced: 18 %
Date Time Interrogation Session: 20190702060018
Implantable Lead Implant Date: 20160226
Implantable Lead Location: 753859
Implantable Lead Location: 753860
Implantable Lead Model: 1948
Implantable Pulse Generator Implant Date: 20160226
Lead Channel Impedance Value: 450 Ohm
Lead Channel Pacing Threshold Pulse Width: 0.4 ms
Lead Channel Sensing Intrinsic Amplitude: 3.5 mV
Lead Channel Setting Pacing Amplitude: 1.625
Lead Channel Setting Sensing Sensitivity: 2 mV
MDC IDC LEAD IMPLANT DT: 20160226
MDC IDC MSMT LEADCHNL RA PACING THRESHOLD AMPLITUDE: 0.625 V
MDC IDC MSMT LEADCHNL RV IMPEDANCE VALUE: 460 Ohm
MDC IDC MSMT LEADCHNL RV PACING THRESHOLD AMPLITUDE: 1 V
MDC IDC MSMT LEADCHNL RV PACING THRESHOLD PULSEWIDTH: 0.4 ms
MDC IDC MSMT LEADCHNL RV SENSING INTR AMPL: 6.5 mV
MDC IDC SET LEADCHNL RV PACING AMPLITUDE: 2.5 V
MDC IDC SET LEADCHNL RV PACING PULSEWIDTH: 0.4 ms
MDC IDC STAT BRADY AP VS PERCENT: 61 %
MDC IDC STAT BRADY AS VP PERCENT: 1 %
MDC IDC STAT BRADY AS VS PERCENT: 20 %
Pulse Gen Model: 2240
Pulse Gen Serial Number: 7700661

## 2018-03-21 ENCOUNTER — Other Ambulatory Visit: Payer: Self-pay

## 2018-03-21 DIAGNOSIS — E1121 Type 2 diabetes mellitus with diabetic nephropathy: Secondary | ICD-10-CM

## 2018-03-21 MED ORDER — GLUCOSE BLOOD VI STRP
ORAL_STRIP | 1 refills | Status: DC
Start: 2018-03-21 — End: 2018-03-23

## 2018-03-22 ENCOUNTER — Other Ambulatory Visit: Payer: Self-pay

## 2018-03-22 DIAGNOSIS — E1121 Type 2 diabetes mellitus with diabetic nephropathy: Secondary | ICD-10-CM

## 2018-03-22 MED ORDER — CONTOUR BLOOD GLUCOSE SYSTEM W/DEVICE KIT: PACK | 0 refills | Status: DC

## 2018-03-23 ENCOUNTER — Other Ambulatory Visit: Payer: Self-pay | Admitting: *Deleted

## 2018-04-05 ENCOUNTER — Other Ambulatory Visit: Payer: Self-pay | Admitting: *Deleted

## 2018-04-05 DIAGNOSIS — E1121 Type 2 diabetes mellitus with diabetic nephropathy: Secondary | ICD-10-CM

## 2018-04-05 MED ORDER — BLOOD GLUCOSE MONITOR KIT: PACK | 0 refills | Status: DC

## 2018-04-07 ENCOUNTER — Encounter: Payer: Self-pay | Admitting: Podiatry

## 2018-04-07 ENCOUNTER — Ambulatory Visit (INDEPENDENT_AMBULATORY_CARE_PROVIDER_SITE_OTHER): Payer: Medicare Other | Admitting: Podiatry

## 2018-04-07 DIAGNOSIS — E1121 Type 2 diabetes mellitus with diabetic nephropathy: Secondary | ICD-10-CM | POA: Diagnosis not present

## 2018-04-07 DIAGNOSIS — M79676 Pain in unspecified toe(s): Secondary | ICD-10-CM | POA: Diagnosis not present

## 2018-04-07 DIAGNOSIS — B351 Tinea unguium: Secondary | ICD-10-CM | POA: Diagnosis not present

## 2018-04-07 NOTE — Progress Notes (Signed)
Patient ID: Tracey Todd StaffMary C Laswell, female   DOB: 07/19/1935, 83 y.o.   MRN: [id number] Complaint:  Visit Type: Patient returns to my office for continued preventative foot care services. Complaint: Patient states" my nails have grown long and thick and become painful to walk and wear shoes" Patient has been diagnosed with DM with no foot complications... The patient presents for preventative foot care services. No changes to ROS  Podiatric Exam: Vascular: dorsalis pedis and posterior tibial pulses are palpable bilateral. Capillary return is immediate. Temperature gradient is WNL. Skin turgor WNL  Sensorium: Diminished  Semmes Weinstein monofilament test. Normal tactile sensation bilaterally. Nail Exam: Pt has thick disfigured discolored nails with subungual debris noted bilateral entire nail hallux through fifth toenails Ulcer Exam: There is no evidence of ulcer or pre-ulcerative changes or infection. Orthopedic Exam: Muscle tone and strength are WNL. No limitations in general ROM. No crepitus or effusions noted. Foot type and digits show no abnormalities. Mild dorsomedial exostosis noted. Skin: No Porokeratosis. No infection or ulcers  Diagnosis:  Onychomycosis, , Pain in right toe, pain in left toes  Treatment & Plan Procedures and Treatment: Consent by patient was obtained for treatment procedures. The patient understood the discussion of treatment and procedures well. All questions were answered thoroughly reviewed. Debridement of mycotic and hypertrophic toenails, 1 through 5 bilateral and clearing of subungual debris. No ulceration, no infection noted. Minimal mechanical debridement requested.  . Return Visit-Office Procedure: Patient instructed to return to the office for a follow up visit 3 months for continued evaluation and treatment.    Helane GuntherGregory Ryley Bachtel DPM

## 2018-04-10 ENCOUNTER — Telehealth: Payer: Self-pay | Admitting: Internal Medicine

## 2018-04-10 ENCOUNTER — Other Ambulatory Visit: Payer: Self-pay | Admitting: *Deleted

## 2018-04-10 MED ORDER — GLUCOSE BLOOD VI STRP: ORAL_STRIP | 1 refills | Status: DC

## 2018-04-10 NOTE — Telephone Encounter (Signed)
please call in FREESTYLE LITE test strips to [facility] [address] [employer] advised we needed to call in. Patient states she has run out.

## 2018-04-11 DIAGNOSIS — E1121 Type 2 diabetes mellitus with diabetic nephropathy: Secondary | ICD-10-CM | POA: Diagnosis not present

## 2018-04-24 ENCOUNTER — Other Ambulatory Visit: Payer: Self-pay | Admitting: Nurse Practitioner

## 2018-05-21 ENCOUNTER — Other Ambulatory Visit: Payer: Self-pay | Admitting: Internal Medicine

## 2018-05-21 DIAGNOSIS — E1142 Type 2 diabetes mellitus with diabetic polyneuropathy: Secondary | ICD-10-CM

## 2018-05-30 ENCOUNTER — Ambulatory Visit (INDEPENDENT_AMBULATORY_CARE_PROVIDER_SITE_OTHER): Payer: Medicare Other | Admitting: *Deleted

## 2018-05-30 DIAGNOSIS — R001 Bradycardia, unspecified: Secondary | ICD-10-CM | POA: Diagnosis not present

## 2018-05-30 NOTE — Progress Notes (Signed)
Remote pacemaker transmission.   

## 2018-06-01 ENCOUNTER — Encounter: Payer: Self-pay | Admitting: Internal Medicine

## 2018-06-01 NOTE — Progress Notes (Signed)
[facility] Unk Pinto, M.D.     Uvaldo Bristle. Silverio Lay, P.A.-C Liane Comber, [place], [place] [address] Telephone [phone] Telefax [phone] Annual Screening/Preventative Visit & Comprehensive Evaluation &  Examination     This very nice 83 y.o. BF presents for a Screening /Preventative Visit & comprehensive evaluation and management of multiple medical co-morbidities.  Patient has been followed for HTN, ASHD/PPM, HLD, T2_NIDDM  Prediabetes  and Vitamin D Deficiency. She is followed by Dr Mina Marble for DDD & EDSI's      HTN predates circa 1990's. Patient's BP has been controlled at home. She has Sick Sinus Syndrome s/p St. Jude pacemaker in 2016 and heart cath showed Nl Coronary arteries. She ghas been dx'd by Cardiology with moderate to severe pulmonary Hypertension.  She denies any cardiac symptoms as chest pain, palpitations, shortness of breath, dizziness or ankle swelling. Today's BP is at goal - 136/70.      Patient's hyperlipidemia is controlled with diet and medications. Patient denies myalgias or other medication SE's. Last lipids were at goal: Lab Results  Component Value Date   CHOL 163 02/24/2018   HDL 66 02/24/2018   LDLCALC 81 02/24/2018   TRIG 82 02/24/2018   CHOLHDL 2.5 02/24/2018      Patient has hx/o Morbid Obesity (BMI 35+) and T2_NIDDM w/CKD2  predating since 1997 and patient denies reactive hypoglycemic symptoms, visual blurring, diabetic polys or paresthesias. She alleges FBG's range below 150 mg%. Patient admits overeating and last A1c was  not at goal: Lab Results  Component Value Date   HGBA1C 6.6 (H) 02/24/2018      Finally, patient has history of Vitamin D Deficiency and last Vitamin D was at goal: Lab Results  Component Value Date   VD25OH 63 10/21/2017   Current Outpatient Medications on File Prior to Visit  Medication Sig  . amLODipine (NORVASC) 10  MG tablet TAKE 1 TABLET BY MOUTH ONCE DAILY  . aspirin 81 MG tablet Take 81 mg by mouth daily.  Marland Kitchen atorvastatin (LIPITOR) 80 MG tablet Take 1/2 to 1 tablet daily or as directed for Cholesterol (Patient taking differently: Take 40 mg by mouth 2 (two) times a week. Take 61m by mouth on Monday and Thursday)  . blood glucose meter kit and supplies KIT Dispense based on patient and insurance preference. Check blood sugar 1 time a day-E11.21  . calcium carbonate (OS-CAL) 600 MG TABS tablet Take 600 mg by mouth daily with breakfast.  . cetirizine (ZYRTEC ALLERGY) 10 MG tablet Take 10 mg by mouth every other day.   . Cholecalciferol (VITAMIN D) 2000 UNITS CAPS Take 6,000 Units by mouth daily.   .Marland Kitchengabapentin (NEURONTIN) 100 MG capsule TAKE 1 TO 2 CAPSULES BY MOUTH THREE TIMES DAILY FOR  NEUROPATHY  PAINS  . glucose blood (FREESTYLE LITE) test strip Check blood sugar 1 time daily-DX-E11.21  . hydrochlorothiazide (HYDRODIURIL) 25 MG tablet TAKE 1 TABLET BY MOUTH ONCE DAILY  . Magnesium 500 MG TABS Take 500 mg by mouth 4 (four) times daily.   . metFORMIN (GLUCOPHAGE-XR) 500 MG 24 hr tablet TAKE 1000MG BY MOUTH TWICE DAILY  . potassium chloride SA (K-DUR,KLOR-CON) 20 MEQ tablet TAKE 1 TABLET BY MOUTH ONCE DAILY   No current facility-administered medications on file prior to visit.    Allergies  Allergen Reactions  . Penicillins Anaphylaxis  . Feldene [Piroxicam] Other (See Comments)    Bleeding   . Calcium-Containing  Compounds Other (See Comments)    "calcium deposits on skin"  . Celebrex [Celecoxib] Itching  . Daypro [Oxaprozin] Itching   Past Medical History:  Diagnosis Date  . DJD (degenerative joint disease)   . Fatty liver   . 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 HTN (hypertension)   . Hypercholesteremia   . Nodule of chest wall   . Normal coronary arteries and LVF 10/23/14 10/24/2014  . Pacemaker implanted 10/25/14- St Jude 10/26/2014  . Paroxysmal atrial fibrillation (HCC)    a.  identified on device interrogation, burden low  . Sick sinus syndrome (HCC)    a. s/p STJ dual chamber PPM   . Type II or unspecified type diabetes mellitus without mention of complication, not stated as uncontrolled   . Vitamin D deficiency    Health Maintenance  Topic Date Due  . URINE MICROALBUMIN  03/28/2018  . INFLUENZA VACCINE  03/30/2018  . OPHTHALMOLOGY EXAM  06/24/2018  . HEMOGLOBIN A1C  08/26/2018  . FOOT EXAM  06/02/2019  . TETANUS/TDAP  08/30/2021  . DEXA SCAN  Completed  . PNA vac Low Risk Adult  Completed   Immunization History  Administered Date(s) Administered  . Influenza Split 05/23/2013  . Influenza, High Dose Seasonal PF 05/30/2014, 05/19/2015, 06/02/2016, 05/26/2017  . Pneumococcal Conjugate-13 08/06/2015  . Pneumococcal Polysaccharide-23 07/29/2013  . Td 08/31/2011   Last Colon - 09/02/2014 - Dr Daine Gravel- recommended no f/u due to age.  Last MGM - 06/21/2017   Last dexaBMD - 12/24/2015 Osteopenia  Past Surgical History:  Procedure Laterality Date  . BREAST SURGERY    . CATARACT EXTRACTION    . LEFT AND RIGHT HEART CATHETERIZATION WITH CORONARY ANGIOGRAM N/A 10/23/2014   Procedure: LEFT AND RIGHT HEART CATHETERIZATION WITH CORONARY ANGIOGRAM;  Surgeon: Blane Ohara, MD;  Location: [facility] CATH LAB;  Service: Cardiovascular;  Laterality: N/A;  . PERMANENT PACEMAKER INSERTION N/A 10/25/2014   STJ dual chamber PPM implanted by Dr Rayann Heman for SSS  . TONSILLECTOMY AND ADENOIDECTOMY    . TOTAL KNEE ARTHROPLASTY     Family History  Problem Relation Age of Onset  . Diabetes Mother   . Cancer Mother   . Cancer Father    Social History   Tobacco Use  . Smoking status: Former Smoker    Last attempt to quit: 12/06/1963    Years since quitting: 54.5  . Smokeless tobacco: Never Used  Substance Use Topics  . Alcohol use: No  . Drug use: Never    ROS Constitutional: Denies fever, chills, weight loss/gain, headaches, insomnia,  night sweats, and change in  appetite. Does c/o fatigue. Eyes: Denies redness, blurred vision, diplopia, discharge, itchy, watery eyes.  ENT: Denies discharge, congestion, post nasal drip, epistaxis, sore throat, earache, hearing loss, dental pain, Tinnitus, Vertigo, Sinus pain, snoring.  Cardio: Denies chest pain, palpitations, irregular heartbeat, syncope, dyspnea, diaphoresis, orthopnea, PND, claudication, edema Respiratory: denies cough, dyspnea, DOE, pleurisy, hoarseness, laryngitis, wheezing.  Gastrointestinal: Denies dysphagia, heartburn, reflux, water brash, pain, cramps, nausea, vomiting, bloating, diarrhea, constipation, hematemesis, melena, hematochezia, jaundice, hemorrhoids Genitourinary: Denies dysuria, frequency, urgency, nocturia, hesitancy, discharge, hematuria, flank pain Breast: Breast lumps, nipple discharge, bleeding.  Musculoskeletal: Denies arthralgia, myalgia, stiffness, Jt. Swelling, pain, limp, and strain/sprain. Denies falls. Skin: Denies puritis, rash, hives, warts, acne, eczema, changing in skin lesion Neuro: No weakness, tremor, incoordination, spasms, paresthesia, pain Psychiatric: Denies confusion, memory loss, sensory loss. Denies Depression. Endocrine: Denies change in weight, skin, hair change, nocturia, and paresthesia,  diabetic polys, visual blurring, hyper / hypo glycemic episodes.  Heme/Lymph: No excessive bleeding, bruising, enlarged lymph nodes.  Physical Exam  BP 136/70   Pulse 68   Temp (!) 97.5 F (36.4 C)   Resp 16   Ht 4' 10.5" (1.486 m)   Wt 175 lb (79.4 kg)   BMI 35.95 kg/m   General Appearance: Well nourished, well groomed and in no apparent distress.  Eyes: PERRLA, EOMs, conjunctiva no swelling or erythema, normal fundi and vessels. Sinuses: No frontal/maxillary tenderness ENT/Mouth: EACs patent / TMs  nl. Nares clear without erythema, swelling, mucoid exudates. Oral hygiene is good. No erythema, swelling, or exudate. Tongue normal, non-obstructing. Tonsils not  swollen or erythematous. Hearing normal.  Neck: Supple, thyroid not palpable. No bruits, nodes or JVD. Respiratory: Respiratory effort normal.  BS equal and clear bilateral without rales, rhonci, wheezing or stridor. Cardio: Heart sounds are normal with regular rate and rhythm and no murmurs, rubs or gallops. Peripheral pulses are normal and equal bilaterally without edema. No aortic or femoral bruits. Chest: symmetric with normal excursions and percussion. Breasts: Symmetric, without lumps, nipple discharge, retractions, or fibrocystic changes.  Abdomen: Flat, soft with bowel sounds active. Nontender, no guarding, rebound, hernias, masses, or organomegaly.  Lymphatics: Non tender without lymphadenopathy.  Musculoskeletal: Full ROM all peripheral extremities, joint stability, 5/5 strength, and normal gait. Skin: Warm and dry without rashes, lesions, cyanosis, clubbing or  ecchymosis.  Neuro: Cranial nerves intact, reflexes equal bilaterally. Normal muscle tone, no cerebellar symptoms. Sensation intact to touch, but decreased to vibratory and Monofilament to the toes bilaterally. Pysch: Alert and oriented X 3, normal affect, Insight and Judgment appropriate.   Assessment and Plan  1. Annual Preventative Screening Examination  2. Essential hypertension  - EKG 12-Lead - Urinalysis, Routine w reflex microscopic - Microalbumin / creatinine urine ratio - CBC with Differential/Platelet - COMPLETE METABOLIC PANEL WITH GFR - Magnesium - TSH  3. Hyperlipidemia, mixed  - EKG 12-Lead - Lipid panel - TSH  4. 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 Vitamin D deficiency  - VITAMIN D 25 Hydroxyl  6. Diabetic polyneuropathy associated with type 2 diabetes mellitus (HCC)  - EKG 12-Lead - HM DIABETES FOOT EXAM - LOW EXTREMITY NEUR EXAM DOCUM - COMPLETE METABOLIC PANEL WITH GFR - Hemoglobin A1c -  Insulin, random  7. Sick sinus syndrome (HCC)  - EKG 12-Lead  8. Atherosclerosis of aorta (HCC)  - EKG 12-Lead  9. Pacemaker implanted 10/25/14- St Jude  - EKG 12-Lead  10. Moderate to severe pulmonary hypertension ([place])  11. Class 2 severe obesity due to excess calories with serious comorbidity and body mass index (BMI) of 35.0 to 35.9 in adult ([place])   12. Screening for colorectal cancer  - POC Hemoccult Bld/Stl  13. Screening for ischemic heart disease  - EKG 12-Lead  14. Medication management  - Urinalysis, Routine w reflex microscopic - Microalbumin / creatinine urine ratio - CBC with Differential/Platelet - COMPLETE METABOLIC PANEL WITH GFR - Magnesium - Lipid panel - TSH - Hemoglobin A1c - Insulin, random - VITAMIN D 25 Hydroxyl        Patient was counseled in prudent diet to achieve/maintain BMI less than 25 for weight control, BP monitoring, regular exercise and medications. Discussed med's effects and SE's. Screening labs and tests as requested with regular follow-up as recommended. Over 40 minutes of exam,  counseling, chart review and high complex critical decision making was performed.

## 2018-06-01 NOTE — Patient Instructions (Addendum)
We Do NOT Approve of  [facility] [place] Soliciting Our Patients  To Do Home Visits & We Do NOT Approve of [employer] > > > > > > > > > > > > > > > > > > > > > > > > > > > > > > > > > > > > > > >  Preventive Care for Adults  A healthy lifestyle and preventive care can promote health and wellness. Preventive health guidelines for women include the following key practices.  A routine yearly physical is a good way to check with your health care provider about your health and preventive screening. It is a chance to share any concerns and updates on your health and to receive a thorough exam.  Visit your dentist for a routine exam and preventive care every 6 months. Brush your teeth twice a day and floss once a day. Good oral hygiene prevents tooth decay and gum disease.  The frequency of eye exams is based on your age, health, family medical history, use of contact lenses, and other factors. Follow your health care provider's recommendations for frequency of eye exams.  Eat a healthy diet. Foods like vegetables, fruits, whole grains, low-fat dairy products, and lean protein foods contain the nutrients you need without too many calories. Decrease your intake of foods high in solid fats, added sugars, and salt. Eat the right amount of calories for you. Get information about a proper diet from your health care provider, if necessary.  Regular physical exercise is one of the most important things you can do for your health. Most adults should get at least 150 minutes of moderate-intensity exercise (any activity that increases your heart rate and causes you to sweat) each week. In addition, most adults need muscle-strengthening exercises on 2 or more days a week.  Maintain a healthy weight. The body mass index (BMI) is a screening tool to identify possible weight problems. It provides an estimate of body fat based on height and weight. Your health care provider can find your BMI  and can help you achieve or maintain a healthy weight. For adults 20 years and older:  A BMI below 18.5 is considered underweight.  A BMI of 18.5 to 24.9 is normal.  A BMI of 25 to 29.9 is considered overweight.  A BMI of 30 and above is considered obese.  Maintain normal blood lipids and cholesterol levels by exercising and minimizing your intake of saturated fat. Eat a balanced diet with plenty of fruit and vegetables. If your lipid or cholesterol levels are high, you are over 50, or you are at high risk for heart disease, you may need your cholesterol levels checked more frequently. Ongoing high lipid and cholesterol levels should be treated with medicines if diet and exercise are not working.  If you smoke, find out from your health care provider how to quit. If you do not use tobacco, do not start.  Lung cancer screening is recommended for adults aged 55-80 years who are at high risk for developing lung cancer because of a history of smoking. A yearly low-dose CT scan of the lungs is recommended for people who have at least a 30-pack-year history of smoking and are a current smoker or have quit within the past 15 years. A pack year of smoking is smoking an average of 1 pack of cigarettes a day for 1 year (for example: 1 pack a day for 30 years or 2 packs a day   for 15 years). Yearly screening should continue until the smoker has stopped smoking for at least 15 years. Yearly screening should be stopped for people who develop a health problem that would prevent them from having lung cancer treatment.  Avoid use of street drugs. Do not share needles with anyone. Ask for help if you need support or instructions about stopping the use of drugs.  High blood pressure causes heart disease and increases the risk of stroke.  Ongoing high blood pressure should be treated with medicines if weight loss and exercise do not work.  If you are 55-79 years old, ask your health care provider if you should take  aspirin to prevent strokes.  Diabetes screening involves taking a blood sample to check your fasting blood sugar level. This should be done once every 3 years, after age 45, if you are within normal weight and without risk factors for diabetes. Testing should be considered at a younger age or be carried out more frequently if you are overweight and have at least 1 risk factor for diabetes.  Breast cancer screening is essential preventive care for women. You should practice "breast self-awareness." This means understanding the normal appearance and feel of your breasts and may include breast self-examination. Any changes detected, no matter how small, should be reported to a health care provider. Women in their 20s and 30s should have a clinical breast exam (CBE) by a health care provider as part of a regular health exam every 1 to 3 years. After age 40, women should have a CBE every year. Starting at age 40, women should consider having a mammogram (breast X-ray test) every year. Women who have a family history of breast cancer should talk to their health care provider about genetic screening. Women at a high risk of breast cancer should talk to their health care providers about having an MRI and a mammogram every year.  Breast cancer gene (BRCA)-related cancer risk assessment is recommended for women who have family members with BRCA-related cancers. BRCA-related cancers include breast, ovarian, tubal, and peritoneal cancers. Having family members with these cancers may be associated with an increased risk for harmful changes (mutations) in the breast cancer genes BRCA1 and BRCA2. Results of the assessment will determine the need for genetic counseling and BRCA1 and BRCA2 testing.  Routine pelvic exams to screen for cancer are no longer recommended for nonpregnant women who are considered low risk for cancer of the pelvic organs (ovaries, uterus, and vagina) and who do not have symptoms. Ask your health  care provider if a screening pelvic exam is right for you.  If you have had past treatment for cervical cancer or a condition that could lead to cancer, you need Pap tests and screening for cancer for at least 20 years after your treatment. If Pap tests have been discontinued, your risk factors (such as having a new sexual partner) need to be reassessed to determine if screening should be resumed. Some women have medical problems that increase the chance of getting cervical cancer. In these cases, your health care provider may recommend more frequent screening and Pap tests.    Colorectal cancer can be detected and often prevented. Most routine colorectal cancer screening begins at the age of 50 years and continues through age 75 years. However, your health care provider may recommend screening at an earlier age if you have risk factors for colon cancer. On a yearly basis, your health care provider may provide home test kits to check   for hidden blood in the stool. Use of a small camera at the end of a tube, to directly examine the colon (sigmoidoscopy or colonoscopy), can detect the earliest forms of colorectal cancer. Talk to your health care provider about this at age 50, when routine screening begins.  Direct exam of the colon should be repeated every 5-10 years through age 75 years, unless early forms of pre-cancerous polyps or small growths are found.  Osteoporosis is a disease in which the bones lose minerals and strength with aging. This can result in serious bone fractures or breaks. The risk of osteoporosis can be identified using a bone density scan. Women ages 65 years and over and women at risk for fractures or osteoporosis should discuss screening with their health care providers. Ask your health care provider whether you should take a calcium supplement or vitamin D to reduce the rate of osteoporosis.  Menopause can be associated with physical symptoms and risks. Hormone replacement therapy  is available to decrease symptoms and risks. You should talk to your health care provider about whether hormone replacement therapy is right for you.  Use sunscreen. Apply sunscreen liberally and repeatedly throughout the day. You should seek shade when your shadow is shorter than you. Protect yourself by wearing long sleeves, pants, a wide-brimmed hat, and sunglasses year round, whenever you are outdoors.  Once a month, do a whole body skin exam, using a mirror to look at the skin on your back. Tell your health care provider of new moles, moles that have irregular borders, moles that are larger than a pencil eraser, or moles that have changed in shape or color.  Stay current with required vaccines (immunizations).  Influenza vaccine. All adults should be immunized every year.  Tetanus, diphtheria, and acellular pertussis (Td, Tdap) vaccine. Pregnant women should receive 1 dose of Tdap vaccine during each pregnancy. The dose should be obtained regardless of the length of time since the last dose. Immunization is preferred during the 27th-36th week of gestation. An adult who has not previously received Tdap or who does not know her vaccine status should receive 1 dose of Tdap. This initial dose should be followed by tetanus and diphtheria toxoids (Td) booster doses every 10 years. Adults with an unknown or incomplete history of completing a 3-dose immunization series with Td-containing vaccines should begin or complete a primary immunization series including a Tdap dose. Adults should receive a Td booster every 10 years.    Zoster vaccine. One dose is recommended for adults aged 60 years or older unless certain conditions are present.    Pneumococcal 13-valent conjugate (PCV13) vaccine. When indicated, a person who is uncertain of her immunization history and has no record of immunization should receive the PCV13 vaccine. An adult aged 19 years or older who has certain medical conditions and has not  been previously immunized should receive 1 dose of PCV13 vaccine. This PCV13 should be followed with a dose of pneumococcal polysaccharide (PPSV23) vaccine. The PPSV23 vaccine dose should be obtained at least 1 or more year(s) after the dose of PCV13 vaccine. An adult aged 19 years or older who has certain medical conditions and previously received 1 or more doses of PPSV23 vaccine should receive 1 dose of PCV13. The PCV13 vaccine dose should be obtained 1 or more years after the last PPSV23 vaccine dose.    Pneumococcal polysaccharide (PPSV23) vaccine. When PCV13 is also indicated, PCV13 should be obtained first. All adults aged 65 years and older should   be immunized. An adult younger than age 65 years who has certain medical conditions should be immunized. Any person who resides in a nursing home or long-term care facility should be immunized. An adult smoker should be immunized. People with an immunocompromised condition and certain other conditions should receive both PCV13 and PPSV23 vaccines. People with human immunodeficiency virus (HIV) infection should be immunized as soon as possible after diagnosis. Immunization during chemotherapy or radiation therapy should be avoided. Routine use of PPSV23 vaccine is not recommended for American Indians, [place] Natives, or people younger than 65 years unless there are medical conditions that require PPSV23 vaccine. When indicated, people who have unknown immunization and have no record of immunization should receive PPSV23 vaccine. One-time revaccination 5 years after the first dose of PPSV23 is recommended for people aged 19-64 years who have chronic kidney failure, nephrotic syndrome, asplenia, or immunocompromised conditions. People who received 1-2 doses of PPSV23 before age 65 years should receive another dose of PPSV23 vaccine at age 65 years or later if at least 5 years have passed since the previous dose. Doses of PPSV23 are not needed for people immunized  with PPSV23 at or after age 65 years.   Preventive Services / Frequency  Ages 65 years and over  Blood pressure check.  Lipid and cholesterol check.  Lung cancer screening. / Every year if you are aged 55-80 years and have a 30-pack-year history of smoking and currently smoke or have quit within the past 15 years. Yearly screening is stopped once you have quit smoking for at least 15 years or develop a health problem that would prevent you from having lung cancer treatment.  Clinical breast exam.** / Every year after age 40 years.   BRCA-related cancer risk assessment.** / For women who have family members with a BRCA-related cancer (breast, ovarian, tubal, or peritoneal cancers).  Mammogram.** / Every year beginning at age 40 years and continuing for as long as you are in good health. Consult with your health care provider.  Pap test.** / Every 3 years starting at age 30 years through age 65 or 70 years with 3 consecutive normal Pap tests. Testing can be stopped between 65 and 70 years with 3 consecutive normal Pap tests and no abnormal Pap or HPV tests in the past 10 years.  Fecal occult blood test (FOBT) of stool. / Every year beginning at age 50 years and continuing until age 75 years. You may not need to do this test if you get a colonoscopy every 10 years.  Flexible sigmoidoscopy or colonoscopy.** / Every 5 years for a flexible sigmoidoscopy or every 10 years for a colonoscopy beginning at age 50 years and continuing until age 75 years.  Hepatitis C blood test.** / For all people born from 1945 through 1965 and any individual with known risks for hepatitis C.  Osteoporosis screening.** / A one-time screening for women ages 65 years and over and women at risk for fractures or osteoporosis.  Skin self-exam. / Monthly.  Influenza vaccine. / Every year.  Tetanus, diphtheria, and acellular pertussis (Tdap/Td) vaccine.** / 1 dose of Td every 10 years.  Zoster vaccine.** / 1 dose  for adults aged 60 years or older.  Pneumococcal 13-valent conjugate (PCV13) vaccine.** / Consult your health care provider.  Pneumococcal polysaccharide (PPSV23) vaccine.** / 1 dose for all adults aged 65 years and older. Screening for abdominal aortic aneurysm (AAA)  by ultrasound is recommended for people who have history of high blood pressure   or who are current or former smokers. ++++++++++++++++++++ Recommend Adult Low Dose Aspirin or  coated  Aspirin 81 mg daily  To reduce risk of Colon Cancer 20 %,  Skin Cancer 26 % ,  Melanoma 46%  and  Pancreatic cancer 60% ++++++++++++++++++++ Vitamin D goal  is between 70-100.  Please make sure that you are taking your Vitamin D as directed.  It is very important as a natural anti-inflammatory  helping hair, skin, and nails, as well as reducing stroke and heart attack risk.  It helps your bones and helps with mood. It also decreases numerous cancer risks so please take it as directed.  Low Vit D is associated with a 200-300% higher risk for CANCER  and 200-300% higher risk for HEART   ATTACK  &  STROKE.   ...................................... It is also associated with higher death rate at younger ages,  autoimmune diseases like Rheumatoid arthritis, Lupus, Multiple Sclerosis.    Also many other serious conditions, like depression, Alzheimer's Dementia, infertility, muscle aches, fatigue, fibromyalgia - just to name a few. ++++++++++++++++++ Recommend the book "The END of DIETING" by Dr Joel Fuhrman  & the book "The END of DIABETES " by Dr Joel Fuhrman At [employer] - get book & Audio CD's    Being diabetic has a  300% increased risk for heart attack, stroke, cancer, and alzheimer- type vascular dementia. It is very important that you work harder with diet by avoiding all foods that are white. Avoid white rice (brown & wild rice is OK), white potatoes (sweetpotatoes in moderation is OK), White bread or wheat bread or anything made out of  white flour like bagels, donuts, rolls, buns, biscuits, cakes, pastries, cookies, pizza crust, and pasta (made from white flour & egg whites) - vegetarian pasta or spinach or wheat pasta is OK. Multigrain breads like Arnold's or Pepperidge [employer], or multigrain sandwich thins or flatbreads.  Diet, exercise and weight loss can reverse and cure diabetes in the early stages.  Diet, exercise and weight loss is very important in the control and prevention of complications of diabetes which affects every system in your body, ie. Brain - dementia/stroke, eyes - glaucoma/blindness, heart - heart attack/heart failure, kidneys - dialysis, stomach - gastric paralysis, intestines - malabsorption, nerves - severe painful neuritis, circulation - gangrene & loss of a leg(s), and finally cancer and Alzheimers.    I recommend avoid fried & greasy foods,  sweets/candy, white rice (brown or wild rice or Quinoa is OK), white potatoes (sweet potatoes are OK) - anything made from white flour - bagels, doughnuts, rolls, buns, biscuits,white and wheat breads, pizza crust and traditional pasta made of white flour & egg white(vegetarian pasta or spinach or wheat pasta is OK).  Multi-grain bread is OK - like multi-grain flat bread or sandwich thins. Avoid alcohol in excess. Exercise is also important.    Eat all the vegetables you want - avoid meat, especially red meat and dairy - especially cheese.  Cheese is the most concentrated form of trans-fats which is the worst thing to clog up our arteries. Veggie cheese is OK which can be found in the fresh produce section at [employer] or [employer] or [employer]  +++++++++++++++++++ DASH Eating Plan  DASH stands for "Dietary Approaches to Stop Hypertension."   The DASH eating plan is a healthy eating plan that has been shown to reduce high blood pressure (hypertension). Additional health benefits may include reducing the risk of type 2 diabetes mellitus, heart disease,   and stroke. The  DASH eating plan may also help with weight loss. WHAT DO I NEED TO KNOW ABOUT THE DASH EATING PLAN? For the DASH eating plan, you will follow these general guidelines:  Choose foods with a percent daily value for sodium of less than 5% (as listed on the food label).  Use salt-free seasonings or herbs instead of table salt or sea salt.  Check with your health care provider or pharmacist before using salt substitutes.  Eat lower-sodium products, often labeled as "lower sodium" or "no salt added."  Eat fresh foods.  Eat more vegetables, fruits, and low-fat dairy products.  Choose whole grains. Look for the word "whole" as the first word in the ingredient list.  Choose fish   Limit sweets, desserts, sugars, and sugary drinks.  Choose heart-healthy fats.  Eat veggie cheese   Eat more home-cooked food and less restaurant, buffet, and fast food.  Limit fried foods.  Cook foods using methods other than frying.  Limit canned vegetables. If you do use them, rinse them well to decrease the sodium.  When eating at a restaurant, ask that your food be prepared with less salt, or no salt if possible.                      WHAT FOODS CAN I EAT? Read Dr Joel Fuhrman's books on The End of Dieting & The End of Diabetes  Grains Whole grain or whole wheat bread. Brown rice. Whole grain or whole wheat pasta. Quinoa, bulgur, and whole grain cereals. Low-sodium cereals. Corn or whole wheat flour tortillas. Whole grain cornbread. Whole grain crackers. Low-sodium crackers.  Vegetables Fresh or frozen vegetables (raw, steamed, roasted, or grilled). Low-sodium or reduced-sodium tomato and vegetable juices. Low-sodium or reduced-sodium tomato sauce and paste. Low-sodium or reduced-sodium canned vegetables.   Fruits All fresh, canned (in natural juice), or frozen fruits.  Protein Products  All fish and seafood.  Dried beans, peas, or lentils. Unsalted nuts and seeds. Unsalted canned  beans.  Dairy Low-fat dairy products, such as skim or 1% milk, 2% or reduced-fat cheeses, low-fat ricotta or cottage cheese, or plain low-fat yogurt. Low-sodium or reduced-sodium cheeses.  Fats and Oils Tub margarines without trans fats. Light or reduced-fat mayonnaise and salad dressings (reduced sodium). Avocado. Safflower, olive, or canola oils. Natural peanut or almond butter.  Other Unsalted popcorn and pretzels. The items listed above may not be a complete list of recommended foods or beverages. Contact your dietitian for more options.  +++++++++++++++  WHAT FOODS ARE NOT RECOMMENDED? Grains/ White flour or wheat flour White bread. White pasta. White rice. Refined cornbread. Bagels and croissants. Crackers that contain trans fat.  Vegetables  Creamed or fried vegetables. Vegetables in a . Regular canned vegetables. Regular canned tomato sauce and paste. Regular tomato and vegetable juices.  Fruits Dried fruits. Canned fruit in light or heavy syrup. Fruit juice.  Meat and Other Protein Products Meat in general - RED meat & White meat.  Fatty cuts of meat. Ribs, chicken wings, all processed meats as bacon, sausage, bologna, salami, fatback, hot dogs, bratwurst and packaged luncheon meats.  Dairy Whole or 2% milk, cream, half-and-half, and cream cheese. Whole-fat or sweetened yogurt. Full-fat cheeses or blue cheese. Non-dairy creamers and whipped toppings. Processed cheese, cheese spreads, or cheese curds.  Condiments Onion and garlic salt, seasoned salt, table salt, and sea salt. Canned and packaged gravies. Worcestershire sauce. Tartar sauce. Barbecue sauce. Teriyaki sauce. Soy sauce, including reduced   sodium. Steak sauce. Fish sauce. Oyster sauce. Cocktail sauce. Horseradish. Ketchup and mustard. Meat flavorings and tenderizers. Bouillon cubes. Hot sauce. Tabasco sauce. Marinades. Taco seasonings. Relishes.  Fats and Oils Butter, stick margarine, lard, shortening and bacon  fat. Coconut, palm kernel, or palm oils. Regular salad dressings.  Pickles and olives. Salted popcorn and pretzels.  The items listed above may not be a complete list of foods and beverages to avoid.    

## 2018-06-02 ENCOUNTER — Ambulatory Visit: Payer: Medicare Other | Admitting: Internal Medicine

## 2018-06-02 VITALS — BP 136/70 | HR 68 | Temp 97.5°F | Resp 16 | Ht 58.5 in | Wt 175.0 lb

## 2018-06-02 DIAGNOSIS — E1142 Type 2 diabetes mellitus with diabetic polyneuropathy: Secondary | ICD-10-CM

## 2018-06-02 DIAGNOSIS — Z1212 Encounter for screening for malignant neoplasm of rectum: Secondary | ICD-10-CM

## 2018-06-02 DIAGNOSIS — E1121 Type 2 diabetes mellitus with diabetic nephropathy: Secondary | ICD-10-CM

## 2018-06-02 DIAGNOSIS — I495 Sick sinus syndrome: Secondary | ICD-10-CM

## 2018-06-02 DIAGNOSIS — Z0000 Encounter for general adult medical examination without abnormal findings: Secondary | ICD-10-CM | POA: Diagnosis not present

## 2018-06-02 DIAGNOSIS — Z0001 Encounter for general adult medical examination with abnormal findings: Secondary | ICD-10-CM

## 2018-06-02 DIAGNOSIS — I10 Essential (primary) hypertension: Secondary | ICD-10-CM

## 2018-06-02 DIAGNOSIS — Z79899 Other long term (current) drug therapy: Secondary | ICD-10-CM

## 2018-06-02 DIAGNOSIS — I700 Atherosclerosis of aorta: Secondary | ICD-10-CM

## 2018-06-02 DIAGNOSIS — Z23 Encounter for immunization: Secondary | ICD-10-CM | POA: Diagnosis not present

## 2018-06-02 DIAGNOSIS — E782 Mixed hyperlipidemia: Secondary | ICD-10-CM | POA: Diagnosis not present

## 2018-06-02 DIAGNOSIS — Z6835 Body mass index (BMI) 35.0-35.9, adult: Secondary | ICD-10-CM

## 2018-06-02 DIAGNOSIS — M76892 Other specified enthesopathies of left lower limb, excluding foot: Secondary | ICD-10-CM

## 2018-06-02 DIAGNOSIS — Z136 Encounter for screening for cardiovascular disorders: Secondary | ICD-10-CM | POA: Diagnosis not present

## 2018-06-02 DIAGNOSIS — Z87891 Personal history of nicotine dependence: Secondary | ICD-10-CM

## 2018-06-02 DIAGNOSIS — E559 Vitamin D deficiency, unspecified: Secondary | ICD-10-CM

## 2018-06-02 DIAGNOSIS — Z950 Presence of cardiac pacemaker: Secondary | ICD-10-CM

## 2018-06-02 DIAGNOSIS — I272 Pulmonary hypertension, unspecified: Secondary | ICD-10-CM

## 2018-06-02 DIAGNOSIS — Z8249 Family history of ischemic heart disease and other diseases of the circulatory system: Secondary | ICD-10-CM

## 2018-06-02 DIAGNOSIS — Z1211 Encounter for screening for malignant neoplasm of colon: Secondary | ICD-10-CM

## 2018-06-02 MED ORDER — DEXAMETHASONE 0.5 MG PO TABS: ORAL_TABLET | ORAL | 0 refills | Status: DC

## 2018-06-02 MED ORDER — LISINOPRIL 20 MG PO TABS: ORAL_TABLET | ORAL | 3 refills | Status: DC

## 2018-06-03 ENCOUNTER — Encounter: Payer: Self-pay | Admitting: Internal Medicine

## 2018-06-05 ENCOUNTER — Other Ambulatory Visit: Payer: Self-pay | Admitting: Internal Medicine

## 2018-06-05 LAB — CBC WITH DIFFERENTIAL/PLATELET
BASOS PCT: 1.3 %
Basophils Absolute: 79 cells/uL (ref 0–200)
EOS ABS: 171 {cells}/uL (ref 15–500)
EOS PCT: 2.8 %
HCT: 35.7 % (ref 35.0–45.0)
HEMOGLOBIN: 11.4 g/dL — AB (ref 11.7–15.5)
Lymphs Abs: 2239 cells/uL (ref 850–3900)
MCH: 26.7 pg — AB (ref 27.0–33.0)
MCHC: 31.9 g/dL — AB (ref 32.0–36.0)
MCV: 83.6 fL (ref 80.0–100.0)
MPV: 10.2 fL (ref 7.5–12.5)
Monocytes Relative: 13 %
NEUTROS ABS: 2818 {cells}/uL (ref 1500–7800)
Neutrophils Relative %: 46.2 %
PLATELETS: 293 10*3/uL (ref 140–400)
RBC: 4.27 10*6/uL (ref 3.80–5.10)
RDW: 14.7 % (ref 11.0–15.0)
TOTAL LYMPHOCYTE: 36.7 %
WBC mixed population: 793 cells/uL (ref 200–950)
WBC: 6.1 10*3/uL (ref 3.8–10.8)

## 2018-06-05 LAB — COMPLETE METABOLIC PANEL WITH GFR
AG RATIO: 1.8 (calc) (ref 1.0–2.5)
ALBUMIN MSPROF: 4.4 g/dL (ref 3.6–5.1)
ALKALINE PHOSPHATASE (APISO): 49 U/L (ref 33–130)
ALT: 8 U/L (ref 6–29)
AST: 16 U/L (ref 10–35)
BILIRUBIN TOTAL: 0.5 mg/dL (ref 0.2–1.2)
BUN: 17 mg/dL (ref 7–25)
CO2: 30 mmol/L (ref 20–32)
CREATININE: 0.63 mg/dL (ref 0.60–0.88)
Calcium: 9.9 mg/dL (ref 8.6–10.4)
Chloride: 102 mmol/L (ref 98–110)
GFR, EST AFRICAN AMERICAN: 97 mL/min/{1.73_m2} (ref >=60)
GFR, Est Non African American: 84 mL/min/{1.73_m2} (ref >=60)
GLOBULIN: 2.5 g/dL (ref 1.9–3.7)
Glucose, Bld: 109 mg/dL — ABNORMAL HIGH (ref 65–99)
Potassium: 4 mmol/L (ref 3.5–5.3)
Sodium: 141 mmol/L (ref 135–146)
Total Protein: 6.9 g/dL (ref 6.1–8.1)

## 2018-06-05 LAB — TSH: TSH: 1.45 mIU/L (ref 0.40–4.50)

## 2018-06-05 LAB — URINALYSIS, ROUTINE W REFLEX MICROSCOPIC
BACTERIA UA: NONE SEEN /HPF
Bilirubin Urine: NEGATIVE
Glucose, UA: NEGATIVE
HYALINE CAST: NONE SEEN /LPF
Hgb urine dipstick: NEGATIVE
KETONES UR: NEGATIVE
Nitrite: NEGATIVE
PROTEIN: NEGATIVE
RBC / HPF: NONE SEEN /HPF (ref 0–2)
SQUAMOUS EPITHELIAL / LPF: NONE SEEN /HPF (ref <=5)
Specific Gravity, Urine: 1.012 (ref 1.001–1.03)
WBC UA: NONE SEEN /HPF (ref 0–5)
pH: 8 (ref 5.0–8.0)

## 2018-06-05 LAB — HEMOGLOBIN A1C
Hgb A1c MFr Bld: 6.9 % of total Hgb — ABNORMAL HIGH (ref <5.7)
Mean Plasma Glucose: 151 (calc)
eAG (mmol/L): 8.4 (calc)

## 2018-06-05 LAB — MICROALBUMIN / CREATININE URINE RATIO
CREATININE, URINE: 45 mg/dL (ref 20–275)
Microalb Creat Ratio: 47 mcg/mg creat — ABNORMAL HIGH (ref <30)
Microalb, Ur: 2.1 mg/dL

## 2018-06-05 LAB — VITAMIN D 25 HYDROXY (VIT D DEFICIENCY, FRACTURES): VIT D 25 HYDROXY: 63 ng/mL (ref 30–100)

## 2018-06-05 LAB — LIPID PANEL
Cholesterol: 165 mg/dL (ref <200)
HDL: 66 mg/dL (ref >50)
LDL CHOLESTEROL (CALC): 86 mg/dL
NON-HDL CHOLESTEROL (CALC): 99 mg/dL (ref <130)
TRIGLYCERIDES: 57 mg/dL (ref <150)
Total CHOL/HDL Ratio: 2.5 (calc) (ref <5.0)

## 2018-06-05 LAB — MAGNESIUM: Magnesium: 1.8 mg/dL (ref 1.5–2.5)

## 2018-06-05 LAB — INSULIN, RANDOM: Insulin: 5 u[IU]/mL (ref 2.0–19.6)

## 2018-06-05 MED ORDER — BISOPROLOL FUMARATE 10 MG PO TABS: ORAL_TABLET | ORAL | 1 refills | Status: DC

## 2018-06-07 ENCOUNTER — Other Ambulatory Visit: Payer: Self-pay | Admitting: *Deleted

## 2018-06-07 MED ORDER — BISOPROLOL FUMARATE 10 MG PO TABS: ORAL_TABLET | ORAL | 1 refills | Status: DC

## 2018-06-08 ENCOUNTER — Telehealth: Payer: Self-pay | Admitting: *Deleted

## 2018-06-08 NOTE — Telephone Encounter (Signed)
Patient is aware of lab results.

## 2018-06-09 ENCOUNTER — Other Ambulatory Visit: Payer: Self-pay | Admitting: Internal Medicine

## 2018-06-09 DIAGNOSIS — E1121 Type 2 diabetes mellitus with diabetic nephropathy: Secondary | ICD-10-CM | POA: Diagnosis not present

## 2018-06-09 DIAGNOSIS — Z1231 Encounter for screening mammogram for malignant neoplasm of breast: Secondary | ICD-10-CM

## 2018-06-22 LAB — CUP PACEART REMOTE DEVICE CHECK
Battery Voltage: 3.01 V
Brady Statistic AP VP Percent: 21 %
Brady Statistic AS VP Percent: 1 %
Brady Statistic AS VS Percent: 26 %
Brady Statistic RA Percent Paced: 71 %
Brady Statistic RV Percent Paced: 22 %
Implantable Lead Implant Date: 20160226
Implantable Lead Implant Date: 20160226
Implantable Lead Location: 753860
Implantable Pulse Generator Implant Date: 20160226
Lead Channel Impedance Value: 480 Ohm
Lead Channel Pacing Threshold Amplitude: 0.625 V
Lead Channel Pacing Threshold Pulse Width: 0.4 ms
Lead Channel Pacing Threshold Pulse Width: 0.4 ms
Lead Channel Sensing Intrinsic Amplitude: 7.2 mV
Lead Channel Setting Pacing Amplitude: 1.625
Lead Channel Setting Pacing Amplitude: 2.5 V
Lead Channel Setting Sensing Sensitivity: 2 mV
MDC IDC LEAD LOCATION: 753859
MDC IDC MSMT BATTERY REMAINING LONGEVITY: 120 mo
MDC IDC MSMT BATTERY REMAINING PERCENTAGE: 95.5 %
MDC IDC MSMT LEADCHNL RA IMPEDANCE VALUE: 430 Ohm
MDC IDC MSMT LEADCHNL RA SENSING INTR AMPL: 2.6 mV
MDC IDC MSMT LEADCHNL RV PACING THRESHOLD AMPLITUDE: 1.25 V
MDC IDC PG SERIAL: 7700661
MDC IDC SESS DTM: 20191001060013
MDC IDC SET LEADCHNL RV PACING PULSEWIDTH: 0.4 ms
MDC IDC STAT BRADY AP VS PERCENT: 52 %

## 2018-06-23 ENCOUNTER — Other Ambulatory Visit: Payer: Self-pay

## 2018-06-23 DIAGNOSIS — Z1212 Encounter for screening for malignant neoplasm of rectum: Principal | ICD-10-CM

## 2018-06-23 DIAGNOSIS — Z1211 Encounter for screening for malignant neoplasm of colon: Secondary | ICD-10-CM

## 2018-06-23 LAB — POC HEMOCCULT BLD/STL (HOME/3-CARD/SCREEN)
Card #2 Fecal Occult Blod, POC: NEGATIVE
FECAL OCCULT BLD: NEGATIVE
Fecal Occult Blood, POC: NEGATIVE

## 2018-06-26 DIAGNOSIS — H35033 Hypertensive retinopathy, bilateral: Secondary | ICD-10-CM | POA: Diagnosis not present

## 2018-06-26 DIAGNOSIS — E119 Type 2 diabetes mellitus without complications: Secondary | ICD-10-CM | POA: Diagnosis not present

## 2018-06-26 DIAGNOSIS — H40023 Open angle with borderline findings, high risk, bilateral: Secondary | ICD-10-CM | POA: Diagnosis not present

## 2018-06-26 DIAGNOSIS — H35363 Drusen (degenerative) of macula, bilateral: Secondary | ICD-10-CM | POA: Diagnosis not present

## 2018-06-26 LAB — HM DIABETES EYE EXAM

## 2018-06-27 ENCOUNTER — Encounter: Payer: Self-pay | Admitting: *Deleted

## 2018-06-27 DIAGNOSIS — Z1211 Encounter for screening for malignant neoplasm of colon: Secondary | ICD-10-CM | POA: Diagnosis not present

## 2018-06-29 ENCOUNTER — Ambulatory Visit
Admission: RE | Admit: 2018-06-29 | Discharge: 2018-06-29 | Disposition: A | Discharge status: Home / Self Care | Payer: Medicare Other | Source: Ambulatory Visit | Attending: Internal Medicine | Admitting: Internal Medicine

## 2018-06-29 DIAGNOSIS — Z1231 Encounter for screening mammogram for malignant neoplasm of breast: Secondary | ICD-10-CM | POA: Diagnosis not present

## 2018-07-07 ENCOUNTER — Encounter: Payer: Self-pay | Admitting: Podiatry

## 2018-07-07 ENCOUNTER — Ambulatory Visit (INDEPENDENT_AMBULATORY_CARE_PROVIDER_SITE_OTHER): Payer: Medicare Other | Admitting: Podiatry

## 2018-07-07 DIAGNOSIS — B351 Tinea unguium: Secondary | ICD-10-CM

## 2018-07-07 DIAGNOSIS — M79676 Pain in unspecified toe(s): Secondary | ICD-10-CM | POA: Diagnosis not present

## 2018-07-07 DIAGNOSIS — E1121 Type 2 diabetes mellitus with diabetic nephropathy: Secondary | ICD-10-CM | POA: Diagnosis not present

## 2018-07-07 NOTE — Progress Notes (Signed)
Patient ID: Tracey Todd, female   DOB: 1935-01-02, 83 y.o.   MRN: [id number] Complaint:  Visit Type: Patient returns to my office for continued preventative foot care services. Complaint: Patient states" my nails have grown long and thick and become painful to walk and wear shoes" Patient has been diagnosed with DM with no foot complications... The patient presents for preventative foot care services. No changes to ROS  Podiatric Exam: Vascular: dorsalis pedis and posterior tibial pulses are palpable bilateral. Capillary return is immediate. Temperature gradient is WNL. Skin turgor WNL  Sensorium: Diminished  Semmes Weinstein monofilament test. Normal tactile sensation bilaterally. Nail Exam: Pt has thick disfigured discolored nails with subungual debris noted bilateral entire nail hallux through fifth toenails Ulcer Exam: There is no evidence of ulcer or pre-ulcerative changes or infection. Orthopedic Exam: Muscle tone and strength are WNL. No limitations in general ROM. No crepitus or effusions noted. Foot type and digits show no abnormalities. Mild dorsomedial exostosis noted. Skin: No Porokeratosis. No infection or ulcers  Diagnosis:  Onychomycosis, , Pain in right toe, pain in left toes  Treatment & Plan Procedures and Treatment: Consent by patient was obtained for treatment procedures. The patient understood the discussion of treatment and procedures well. All questions were answered thoroughly reviewed. Debridement of mycotic and hypertrophic toenails, 1 through 5 bilateral and clearing of subungual debris. No ulceration, no infection noted. Minimal mechanical debridement requested.  . Return Visit-Office Procedure: Patient instructed to return to the office for a follow up visit 3 months for continued evaluation and treatment.    Helane Gunther DPM

## 2018-08-16 ENCOUNTER — Telehealth: Payer: Self-pay | Admitting: *Deleted

## 2018-08-16 NOTE — Telephone Encounter (Signed)
Received AT/AF alert from patient's PPM due to persistent AF since 08/14/18 at 1000. She notes some variability with her BP, but only checks it before she takes her morning meds. Patient denies ShOB or chest discomfort, but does note that her weight seems to be up a little bit and her abdomen feels tight/full. She previously declined OAC due to low burden/short episodes. Advised patient I will discuss with Dr. Johney FrameAllred for recommendations and call her back. She is agreeable to this plan.

## 2018-08-17 NOTE — Telephone Encounter (Signed)
Spoke w/ patient.  Scheduled pt to see Ronie Spiesayna Dunn, PA on Monday 12/23 to discuss starting blood thinner. Pt agreeable to plan.

## 2018-08-19 ENCOUNTER — Encounter: Payer: Self-pay | Admitting: Physician Assistant

## 2018-08-19 NOTE — Progress Notes (Signed)
Cardiology Office Note    Date:  08/21/2018  ID:  HECTOR VENNE, DOB [birth year], MRN [id number] PCP:  No primary care provider on file.  Cardiologist:  [name], MD   Chief Complaint: more atrial fib on PPM  History of Present Illness:  Tracey Todd is a 83 y.o. female with history of HTN, HLD (followed by PCP), paroxysmal atrial fib, SSS s/p St. Jude PPM, DM, minimal CAD, probable chronic diastolic CHF/pulm HTN who presents to discuss anticoagulation.  In 09/2014 she presented with symptoms concerning for CHF with weight gain, edema, and PND. At that time troponin was mildly elevated and she had a variety of rhythms including atrial flutter with slow ventricular response, sinus bradycardia, junctional bradycardia, and ventricular rhythm with RBBB morphology. 2D echo 10/22/14 showed EF 55-60%, mild AI/MR, mildly dilated LA/RA, moderate TR, moderate pulm HTN. Cardiac cath showed minimal nonobstructive CAD, normal filling pressures. She has since gone onto have PPM placement by Dr. Rayann Heman. Initially atrial arrhythmia burden was lower so [place] was deferred but more recently she has had persistent atrial fib since 08/14/18 (as of 12/18 phone note) so appointment was made per EP request to discuss starting anticoagulation. Last labs 05/2018 hemoccult normal, 05/2018 Hgb 11.8, plt 293, Cr 0.63, LFTs wnl, TSH wln, LDL 86.  She returns for follow-up overall feeling well. She has been unaware of her atrial fib. No CP, SOB, palpitations, syncope or prior h/o bleeding. She does report mild increase in LEE and abdominal bloating. Weight is up 6lb from prior visit but she has also been indulging in holiday treats.   Past Medical History:  Diagnosis Date  . Atrial flutter ([place])    a. mentioned in 2016 admission.  . Chronic diastolic CHF (congestive heart failure) ([place])   . DJD (degenerative joint disease)   . Fatty liver   . 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 HTN (hypertension)   .  Hypercholesteremia   . Mild CAD    a. minimal by cath 2016.  . Nodule of chest wall   . Normal coronary arteries and LVF 10/23/14 10/24/2014  . Pacemaker implanted 10/25/14- St Jude 10/26/2014  . Paroxysmal atrial fibrillation (HCC)    a. identified on device interrogation, burden low  . Pulmonary hypertension ([place])   . Sick sinus syndrome (HCC)    a. s/p STJ dual chamber PPM   . Type II or unspecified type diabetes mellitus without mention of complication, not stated as uncontrolled   . Vitamin D deficiency     Past Surgical History:  Procedure Laterality Date  . BREAST SURGERY    . CATARACT EXTRACTION    . LEFT AND RIGHT HEART CATHETERIZATION WITH CORONARY ANGIOGRAM N/A 10/23/2014   Procedure: LEFT AND RIGHT HEART CATHETERIZATION WITH CORONARY ANGIOGRAM;  Surgeon: Blane Ohara, MD;  Location: [facility] CATH LAB;  Service: Cardiovascular;  Laterality: N/A;  . PERMANENT PACEMAKER INSERTION N/A 10/25/2014   STJ dual chamber PPM implanted by Dr Rayann Heman for SSS  . TONSILLECTOMY AND ADENOIDECTOMY    . TOTAL KNEE ARTHROPLASTY      Current Medications: Current Meds  Medication Sig  . aspirin 81 MG tablet Take 81 mg by mouth daily.  Marland Kitchen atorvastatin (LIPITOR) 80 MG tablet Take 1/2 to 1 tablet daily or as directed for Cholesterol (Patient taking differently: Take 1/2 tablet by mouth on Monday and Thursday)  . bisoprolol (ZEBETA) 10 MG tablet Take 1 tablet daily for BP  . blood glucose meter kit and  supplies KIT Dispense based on patient and insurance preference. Check blood sugar 1 time a day-E11.21  . calcium carbonate (OS-CAL) 600 MG TABS tablet Take 600 mg by mouth daily with breakfast.  . cetirizine (ZYRTEC ALLERGY) 10 MG tablet Take 10 mg by mouth every other day.   . Cholecalciferol (VITAMIN D) 2000 UNITS CAPS Take 6,000 Units by mouth daily.   Marland Kitchen gabapentin (NEURONTIN) 100 MG capsule TAKE 1 TO 2 CAPSULES BY MOUTH THREE TIMES DAILY FOR  NEUROPATHY  PAINS  . glucose blood (FREESTYLE LITE) test  strip Check blood sugar 1 time daily-DX-E11.21  . hydrochlorothiazide (HYDRODIURIL) 25 MG tablet TAKE 1 TABLET BY MOUTH ONCE DAILY  . Lancets (FREESTYLE) lancets USE 1 LANCET TO CHECK GLUCOSE ONCE DAILY  . Magnesium 500 MG TABS Take 500 mg by mouth 4 (four) times daily.   . metFORMIN (GLUCOPHAGE-XR) 500 MG 24 hr tablet TAKE '1000MG'$  BY MOUTH TWICE DAILY  . potassium chloride SA (K-DUR,KLOR-CON) 20 MEQ tablet TAKE 1 TABLET BY MOUTH ONCE DAILY    Allergies:   Penicillins; Quinapril; Feldene [piroxicam]; Calcium-containing compounds; Celebrex [celecoxib]; and Daypro [oxaprozin]   Social History   Socioeconomic History  . Marital status: Married    Spouse name: Not on file  . Number of children: Not on file  . Years of education: Not on file  . Highest education level: Not on file  Occupational History  . Not on file  Social Needs  . Financial resource strain: Not on file  . Food insecurity:    Worry: Not on file    Inability: Not on file  . Transportation needs:    Medical: Not on file    Non-medical: Not on file  Tobacco Use  . Smoking status: Former Smoker    Last attempt to quit: 12/06/1963    Years since quitting: 54.7  . Smokeless tobacco: Never Used  Substance and Sexual Activity  . Alcohol use: No  . Drug use: Never  . Sexual activity: Not on file  Lifestyle  . Physical activity:    Days per week: Not on file    Minutes per session: Not on file  . Stress: Not on file  Relationships  . Social connections:    Talks on phone: Not on file    Gets together: Not on file    Attends religious service: Not on file    Active member of club or organization: Not on file    Attends meetings of clubs or organizations: Not on file    Relationship status: Not on file  Other Topics Concern  . Not on file  Social History Narrative  . Not on file     Family History:  The patient's family history includes Cancer in her father and mother; Diabetes in her mother.  ROS:   Please  see the history of present illness.  All other systems are reviewed and otherwise negative.    PHYSICAL EXAM:   VS:  BP 138/68   Pulse 60   Ht 4' 10.5" (1.486 m)   Wt 181 lb 3.2 oz (82.2 kg)   SpO2 98%   BMI 37.23 kg/m   BMI: Body mass index is 37.23 kg/m. GEN: Well nourished, well developed obese AAF in no acute distress HEENT: normocephalic, atraumatic Neck: no JVD, carotid bruits, or masses Cardiac: RRR; no murmurs, rubs, or gallops, trace BLE edema  Respiratory:  clear to auscultation bilaterally, normal work of breathing GI: soft, nontender, rounded/obese, not firm, + BS MS:  no deformity or atrophy Skin: warm and dry, no rash Neuro:  Alert and Oriented x 3, Strength and sensation are intact, follows commands Psych: euthymic mood, full affect  Wt Readings from Last 3 Encounters:  08/21/18 181 lb 3.2 oz (82.2 kg)  06/02/18 175 lb (79.4 kg)  03/06/18 176 lb (79.8 kg)      Studies/Labs Reviewed:   EKG:  EKG was ordered today and personally reviewed by me and demonstrates AV paced 60bpm, nonspecific STT changes, nonacute  Recent Labs: 06/02/2018: ALT 8; BUN 17; Creat 0.63; Hemoglobin 11.4; Magnesium 1.8; Platelets 293; Potassium 4.0; Sodium 141; TSH 1.45   Lipid Panel    Component Value Date/Time   CHOL 165 06/02/2018 0950   TRIG 57 06/02/2018 0950   HDL 66 06/02/2018 0950   CHOLHDL 2.5 06/02/2018 0950   VLDL 15 03/28/2017 0907   LDLCALC 86 06/02/2018 0950    Additional studies/ records that were reviewed today include: Summarized above.   ASSESSMENT & PLAN:   1. Paroxysmal atrial fibrillation - Dr. Rayann Heman has previously advised that if her afib burden should increase she would need to discuss [place]. It appears she had a prolonged episode of AF at least from 12/16-12/18 per phone note. She is in NSR (atrial paced) today so has since broken out of it. CHADSVASC is 7, therefore anticoagulation is recommended. Discussed risks, benefits, alternatives. She is agreeable  to starting. Will initiate Eliquis 30m BID and stop aspirin. Update echocardiogram given h/o rheumatic fever. No acute valvular abnormalities auscultated today. Check baseline labs to exclude any acute precipitant of arrhythmia. 2. SSS s/p PPM - followed by Dr. ARayann Heman 3. HTN - see below. The patient was instructed to monitor their blood pressure at home and to call if tending to run higher than 1355systolic or 80 diastolic. She reports her BP appeared more controlled on amlodipine than the bisoprolol that Dr. ARayann Hemanstarted. Follow with transition from HCTZ to Lasix. 4. Chronic diastolic CHF with pulmonary HTN - appears mildly volume overloaded today on exam. Update echocardiogram. Will switch HCTZ to Lasix 287mdaily for more potent diuretic effect. If labs stable, she will be advised that she can take 1 extra tablet daily as needed for swelling. Repeat BMET 1 week after Lasix initiation.  Disposition: F/u with EP APP/Dr. Allred in 4 weeks to reassess how she is doing on anticoagulation and repeat BMET/CBC at that time for general screening, then otherwise should be OK to get back on usual follow-up.  Medication Adjustments/Labs and Tests Ordered: Current medicines are reviewed at length with the patient today.  Concerns regarding medicines are outlined above. Medication changes, Labs and Tests ordered today are summarized above and listed in the Patient Instructions accessible in Encounters.   Signed, DaCharlie PitterPA-C  08/21/2018 11:21 AM    [place]  [id number]: [phone]: [phone]

## 2018-08-21 ENCOUNTER — Ambulatory Visit (INDEPENDENT_AMBULATORY_CARE_PROVIDER_SITE_OTHER): Payer: Medicare Other | Admitting: Physician Assistant

## 2018-08-21 ENCOUNTER — Encounter: Payer: Self-pay | Admitting: Physician Assistant

## 2018-08-21 ENCOUNTER — Encounter (INDEPENDENT_AMBULATORY_CARE_PROVIDER_SITE_OTHER): Payer: Self-pay

## 2018-08-21 VITALS — BP 138/68 | HR 60 | Ht 58.5 in | Wt 181.2 lb

## 2018-08-21 DIAGNOSIS — I5032 Chronic diastolic (congestive) heart failure: Secondary | ICD-10-CM | POA: Diagnosis not present

## 2018-08-21 DIAGNOSIS — I272 Pulmonary hypertension, unspecified: Secondary | ICD-10-CM

## 2018-08-21 DIAGNOSIS — Z950 Presence of cardiac pacemaker: Secondary | ICD-10-CM | POA: Diagnosis not present

## 2018-08-21 DIAGNOSIS — I10 Essential (primary) hypertension: Secondary | ICD-10-CM

## 2018-08-21 DIAGNOSIS — I480 Paroxysmal atrial fibrillation: Secondary | ICD-10-CM

## 2018-08-21 LAB — BASIC METABOLIC PANEL
BUN/Creatinine Ratio: 22 (ref 12–28)
BUN: 17 mg/dL (ref 8–27)
CALCIUM: 9.9 mg/dL (ref 8.7–10.3)
CO2: 25 mmol/L (ref 20–29)
CREATININE: 0.79 mg/dL (ref 0.57–1.00)
Chloride: 99 mmol/L (ref 96–106)
GFR calc Af Amer: 81 mL/min/{1.73_m2} (ref >59)
GFR calc non Af Amer: 70 mL/min/{1.73_m2} (ref >59)
GLUCOSE: 90 mg/dL (ref 65–99)
POTASSIUM: 4.6 mmol/L (ref 3.5–5.2)
SODIUM: 140 mmol/L (ref 134–144)

## 2018-08-21 LAB — MAGNESIUM: MAGNESIUM: 2 mg/dL (ref 1.6–2.3)

## 2018-08-21 MED ORDER — APIXABAN 5 MG PO TABS: 5.0000 mg | ORAL_TABLET | Freq: Two times a day (BID) | ORAL | 11 refills | Status: DC

## 2018-08-21 MED ORDER — FUROSEMIDE 20 MG PO TABS: 20.0000 mg | ORAL_TABLET | Freq: Every day | ORAL | 3 refills | Status: DC

## 2018-08-21 NOTE — Addendum Note (Signed)
Addended by: Burnetta SabinWITTY, Marenda Accardi K on: 08/21/2018 03:58 PM   Modules accepted: Orders

## 2018-08-21 NOTE — Patient Instructions (Addendum)
Medication Instructions:  Your physician has recommended you make the following change in your medication:  1.  STOP the Aspirin 2.  STOP the Hydrochlorothiazide 3.  START Lasix 20 mg taking 1 tablet dialy 4.  START Eliquis 5 mg taking 1 tablet twice a day  If you need a refill on your cardiac medications before your next appointment, please call your pharmacy.   Lab work: TODAY:  BMET & MAGNESIUM 1 WEEK: BMET  If you have labs (blood work) drawn today and your tests are completely normal, you will receive your results only by: Marland Kitchen. MyChart Message (if you have MyChart) OR . A paper copy in the mail If you have any lab test that is abnormal or we need to change your treatment, we will call you to review the results.  Testing/Procedures: Your physician has requested that you have an echocardiogram. Echocardiography is a painless test that uses sound waves to create images of your heart. It provides your doctor with information about the size and shape of your heart and how well your heart's chambers and valves are working. This procedure takes approximately one hour. There are no restrictions for this procedure.    Follow-Up: Your physician recommends that you schedule a follow-up appointment in: 4 WEEKS WITH EP APP OR DR. ALLRED    Any Other Special Instructions Will Be Listed Below (If Applicable).  For patients with history of fluid retention, we give them these special instructions:  1. Follow a low-salt diet - you are allowed no more than 2,000mg  of sodium per day. Watch your fluid intake. In general, you should not be taking in more than 2 liters of fluid per day (no more than 8 glasses per day). This includes sources of water in foods like soup, coffee, tea, milk, etc. 2. Weigh yourself on the same scale at same time of day and keep a log. 3. Call your doctor: (Anytime you feel any of the following symptoms)  - 3lb weight gain overnight or 5lb within a few days - Shortness of  breath, with or without a dry hacking cough  - Swelling in the hands, feet or stomach  - If you have to sleep on extra pillows at night in order to breathe   IT IS IMPORTANT TO LET YOUR DOCTOR KNOW EARLY ON IF YOU ARE HAVING SYMPTOMS SO WE CAN HELP YOU!   Please monitor your blood pressure occasionally at home. Call your doctor if you tend to get readings of greater than 130 on the top number or 80 on the bottom number.   Echocardiogram An echocardiogram is a procedure that uses painless sound waves (ultrasound) to produce an image of the heart. Images from an echocardiogram can provide important information about:  Signs of coronary artery disease (CAD).  Aneurysm detection. An aneurysm is a weak or damaged part of an artery wall that bulges out from the normal force of blood pumping through the body.  Heart size and shape. Changes in the size or shape of the heart can be associated with certain conditions, including heart failure, aneurysm, and CAD.  Heart muscle function.  Heart valve function.  Signs of a past heart attack.  Fluid buildup around the heart.  Thickening of the heart muscle.  A tumor or infectious growth around the heart valves. Tell a health care provider about:  Any allergies you have.  All medicines you are taking, including vitamins, herbs, eye drops, creams, and over-the-counter medicines.  Any blood disorders you  have.  Any surgeries you have had.  Any medical conditions you have.  Whether you are pregnant or may be pregnant. What are the risks? Generally, this is a safe procedure. However, problems may occur, including:  Allergic reaction to dye (contrast) that may be used during the procedure. What happens before the procedure? No specific preparation is needed. You may eat and drink normally. What happens during the procedure?   An IV tube may be inserted into one of your veins.  You may receive contrast through this tube. A contrast is  an injection that improves the quality of the pictures from your heart.  A gel will be applied to your chest.  A wand-like tool (transducer) will be moved over your chest. The gel will help to transmit the sound waves from the transducer.  The sound waves will harmlessly bounce off of your heart to allow the heart images to be captured in real-time motion. The images will be recorded on a computer. The procedure may vary among health care providers and hospitals. What happens after the procedure?  You may return to your normal, everyday life, including diet, activities, and medicines, unless your health care provider tells you not to do that. Summary  An echocardiogram is a procedure that uses painless sound waves (ultrasound) to produce an image of the heart.  Images from an echocardiogram can provide important information about the size and shape of your heart, heart muscle function, heart valve function, and fluid buildup around your heart.  You do not need to do anything to prepare before this procedure. You may eat and drink normally.  After the echocardiogram is completed, you may return to your normal, everyday life, unless your health care provider tells you not to do that. This information is not intended to replace advice given to you by your health care provider. Make sure you discuss any questions you have with your health care provider. Document Released: 08/13/2000 Document Revised: 09/18/2016 Document Reviewed: 09/18/2016 Elsevier Interactive Patient Education  2019 [employer]

## 2018-08-22 ENCOUNTER — Other Ambulatory Visit: Payer: Medicare Other | Admitting: *Deleted

## 2018-08-22 DIAGNOSIS — I5032 Chronic diastolic (congestive) heart failure: Secondary | ICD-10-CM

## 2018-08-22 DIAGNOSIS — I10 Essential (primary) hypertension: Secondary | ICD-10-CM | POA: Diagnosis not present

## 2018-08-22 DIAGNOSIS — I272 Pulmonary hypertension, unspecified: Secondary | ICD-10-CM

## 2018-08-22 DIAGNOSIS — Z950 Presence of cardiac pacemaker: Secondary | ICD-10-CM | POA: Diagnosis not present

## 2018-08-22 DIAGNOSIS — I480 Paroxysmal atrial fibrillation: Secondary | ICD-10-CM | POA: Diagnosis not present

## 2018-08-22 LAB — CBC
Hematocrit: 35.3 % (ref 34.0–46.6)
Hemoglobin: 11.6 g/dL (ref 11.1–15.9)
MCH: 27.1 pg (ref 26.6–33.0)
MCHC: 32.9 g/dL (ref 31.5–35.7)
MCV: 83 fL (ref 79–97)
PLATELETS: 300 10*3/uL (ref 150–450)
RBC: 4.28 x10E6/uL (ref 3.77–5.28)
RDW: 15.3 % (ref 12.3–15.4)
WBC: 6.7 10*3/uL (ref 3.4–10.8)

## 2018-08-22 LAB — TSH: TSH: 3.9 u[IU]/mL (ref 0.450–4.500)

## 2018-08-28 ENCOUNTER — Other Ambulatory Visit: Payer: Self-pay

## 2018-08-28 ENCOUNTER — Telehealth: Payer: Self-pay

## 2018-08-28 ENCOUNTER — Other Ambulatory Visit: Payer: Medicare Other

## 2018-08-28 ENCOUNTER — Ambulatory Visit (HOSPITAL_COMMUNITY): Payer: Medicare Other | Attending: Cardiology

## 2018-08-28 DIAGNOSIS — I272 Pulmonary hypertension, unspecified: Secondary | ICD-10-CM

## 2018-08-28 DIAGNOSIS — I10 Essential (primary) hypertension: Secondary | ICD-10-CM

## 2018-08-28 DIAGNOSIS — Z950 Presence of cardiac pacemaker: Secondary | ICD-10-CM | POA: Insufficient documentation

## 2018-08-28 DIAGNOSIS — I480 Paroxysmal atrial fibrillation: Secondary | ICD-10-CM | POA: Diagnosis not present

## 2018-08-28 DIAGNOSIS — I5032 Chronic diastolic (congestive) heart failure: Secondary | ICD-10-CM | POA: Diagnosis not present

## 2018-08-28 NOTE — Telephone Encounter (Signed)
Pt came by the office stating that she could not afford her Eliquis.Marland Kitchen.it will cost her $400/mth out of pocket. I contacted her Ins Co and spoke with Tamika, she stated this is a Tier 3 drug so I done a Tier exception. They will notify us of a determination later today.

## 2018-08-29 ENCOUNTER — Telehealth: Payer: Self-pay | Admitting: *Deleted

## 2018-08-29 ENCOUNTER — Other Ambulatory Visit: Payer: Medicare Other | Admitting: *Deleted

## 2018-08-29 ENCOUNTER — Ambulatory Visit (INDEPENDENT_AMBULATORY_CARE_PROVIDER_SITE_OTHER): Payer: Medicare Other

## 2018-08-29 ENCOUNTER — Other Ambulatory Visit: Payer: Self-pay | Admitting: Physician Assistant

## 2018-08-29 DIAGNOSIS — I480 Paroxysmal atrial fibrillation: Secondary | ICD-10-CM | POA: Diagnosis not present

## 2018-08-29 DIAGNOSIS — I5032 Chronic diastolic (congestive) heart failure: Secondary | ICD-10-CM | POA: Diagnosis not present

## 2018-08-29 DIAGNOSIS — R001 Bradycardia, unspecified: Secondary | ICD-10-CM

## 2018-08-29 DIAGNOSIS — Z950 Presence of cardiac pacemaker: Secondary | ICD-10-CM | POA: Diagnosis not present

## 2018-08-29 DIAGNOSIS — I10 Essential (primary) hypertension: Secondary | ICD-10-CM | POA: Diagnosis not present

## 2018-08-29 LAB — CUP PACEART REMOTE DEVICE CHECK
Battery Remaining Longevity: 112 mo
Battery Remaining Percentage: 95.5 %
Battery Voltage: 2.99 V
Brady Statistic AP VP Percent: 53 %
Brady Statistic AS VP Percent: 1 %
Brady Statistic RA Percent Paced: 83 %
Brady Statistic RV Percent Paced: 54 %
Date Time Interrogation Session: 20191231070018
Implantable Lead Implant Date: 20160226
Implantable Lead Implant Date: 20160226
Implantable Lead Location: 753859
Implantable Lead Location: 753860
Implantable Lead Model: 1948
Implantable Pulse Generator Implant Date: 20160226
Lead Channel Impedance Value: 460 Ohm
Lead Channel Pacing Threshold Amplitude: 0.5 V
Lead Channel Pacing Threshold Pulse Width: 0.4 ms
Lead Channel Pacing Threshold Pulse Width: 0.4 ms
Lead Channel Sensing Intrinsic Amplitude: 2.4 mV
Lead Channel Sensing Intrinsic Amplitude: 6.7 mV
Lead Channel Setting Pacing Amplitude: 1.5 V
Lead Channel Setting Pacing Amplitude: 2.5 V
Lead Channel Setting Pacing Pulse Width: 0.4 ms
Lead Channel Setting Sensing Sensitivity: 2 mV
MDC IDC MSMT LEADCHNL RA IMPEDANCE VALUE: 390 Ohm
MDC IDC MSMT LEADCHNL RV PACING THRESHOLD AMPLITUDE: 1.25 V
MDC IDC STAT BRADY AP VS PERCENT: 32 %
MDC IDC STAT BRADY AS VS PERCENT: 14 %
Pulse Gen Model: 2240
Pulse Gen Serial Number: 7700661

## 2018-08-29 LAB — BASIC METABOLIC PANEL
BUN/Creatinine Ratio: 22 (ref 12–28)
BUN: 17 mg/dL (ref 8–27)
CO2: 24 mmol/L (ref 20–29)
Calcium: 9.4 mg/dL (ref 8.7–10.3)
Chloride: 100 mmol/L (ref 96–106)
Creatinine, Ser: 0.78 mg/dL (ref 0.57–1.00)
GFR calc Af Amer: 82 mL/min/{1.73_m2} (ref >59)
GFR calc non Af Amer: 72 mL/min/{1.73_m2} (ref >59)
Glucose: 116 mg/dL — ABNORMAL HIGH (ref 65–99)
Potassium: 3.9 mmol/L (ref 3.5–5.2)
Sodium: 140 mmol/L (ref 134–144)

## 2018-08-29 NOTE — Telephone Encounter (Signed)
Pt returned my call and she has been made aware of her echo results and recommendations. See result note.

## 2018-08-29 NOTE — Telephone Encounter (Signed)
-----   Message from Dayna N Dunn, PA-C sent at 08/29/2018  9:00 AM EST ----- Just ask her to discuss with EP at f/u appointment. Thank you! We did not discuss those things in clinic that day, only about anticoagulation. ----- Message ----- From: Niklaus Mamaril, RMA Sent: 08/29/2018   8:28 AM EST To: Dayna N Dunn, PA-C  Talked with the Wanda @ [facility], she states that it has to be noted if pt has daytime sleepiness or snoring. I don't see anywhere that it's been documented. What do I need to do? Will wait to call pt. Thanks!  

## 2018-08-29 NOTE — Telephone Encounter (Signed)
Pt was prescribed Eliquis recently. Her Insurance covers it but it's a Tier 3 and will cost her $400/mth. I did a tier exception but it was denied because per Medicare Part D it can not be lowered. Pt states that she can not afford this and she will not qualify for Pt Asst since her Insurance covers it.    Eliquis and Xarelto are both Tier 3, Warfarin is a Tier 1. All others are Tier 4.  We are submitting a Pt Asst application but I was told by Larita FifeLynn Via that it would not be approved and we have no samples of this medication so I wanted you to be aware just incase a change in meds needs to be made.

## 2018-08-29 NOTE — Telephone Encounter (Signed)
Pt was prescribed Eliquis recently. Her Insurance covers it but it's a Tier 3 and will cost her $400/mth. I done a tier exception but it was denied because per Medicare Part D it can not be lowered. Pt states that she can not afford this and she will not qualify for Pt Asst since her Insurance covers it.    Eliquis and Xarelto are both Tier 3, Warfarin is a Tier 1. All others are Tier 4.

## 2018-08-29 NOTE — Telephone Encounter (Signed)
-----   Message from Laurann Montanaayna N Dunn, [place]PA-C sent at 08/29/2018  9:00 AM EST ----- Just ask her to discuss with EP at f/u appointment. Thank you! We did not discuss those things in clinic that day, only about anticoagulation. ----- Message ----- From: Elliot CousinWitty, Joury Allcorn, RMA Sent: 08/29/2018   8:28 AM EST To: Laurann Montanaayna N Dunn, PA-C  [facility] with [facility] @ [facility], she states that it has to be noted if pt has daytime sleepiness or snoring. I don't see anywhere that it's been documented. What do I need to do? Will wait to call pt. Thanks!

## 2018-08-29 NOTE — Progress Notes (Signed)
Remote pacemaker transmission.   

## 2018-08-29 NOTE — Telephone Encounter (Signed)
I called pt and notified her of this cost of medication. I told her that she may not get Pt Asst but we will try for her. She will come by Monday to sign the form and bring documentation needed (proof of income and expense report).

## 2018-08-29 NOTE — Telephone Encounter (Signed)
Called pt re: echo results.  Left a message for her to call back. 

## 2018-08-31 DIAGNOSIS — E1121 Type 2 diabetes mellitus with diabetic nephropathy: Secondary | ICD-10-CM | POA: Diagnosis not present

## 2018-09-01 NOTE — Telephone Encounter (Signed)
LMTCB with family member.    Advised to call today before 5 pm.  Family indicates understanding.

## 2018-09-01 NOTE — Telephone Encounter (Signed)
Spoke with Pt.  Pt is coming to office Monday.   Pt being assisted by patient assistance at this time.  Will continue to monitor.

## 2018-09-04 NOTE — Telephone Encounter (Signed)
**Note De-Identified Tracey Todd Obfuscation** The pt came to the office today for assistance with pt asst for her Eliquis as she cannot afford.  Per the pt she does have Part D insurance but has not met her deductible for this year.  She is advised that she is not eligible for pt asst as she has insurance that is paying on her Eliquis and she is not in the donut hole. I made her aware that per Medicare part D rules once a medication has been lowered to a tier 3 as Eliquis has it cannot be lowered any further.  I asked the pt to wait in the waiting room while I went to discuss with Dr Rayann Heman and his nurse.  Per Dr Rayann Heman I gave her 3 weeks worth of Eliquis samples and advised her that a change in medication will be discussed at her next f/u on 1/29.   She verbalized understanding and thanked me for my help.

## 2018-09-07 ENCOUNTER — Ambulatory Visit: Payer: Self-pay | Admitting: Adult Health

## 2018-09-26 ENCOUNTER — Ambulatory Visit: Payer: Medicare Other | Admitting: Family Medicine

## 2018-09-27 ENCOUNTER — Other Ambulatory Visit: Payer: Self-pay | Admitting: Adult Health

## 2018-09-27 ENCOUNTER — Ambulatory Visit: Payer: Medicare Other | Admitting: Internal Medicine

## 2018-09-27 ENCOUNTER — Encounter: Payer: Self-pay | Admitting: Internal Medicine

## 2018-09-27 VITALS — BP 128/70 | HR 61 | Ht <= 58 in | Wt 177.8 lb

## 2018-09-27 DIAGNOSIS — I10 Essential (primary) hypertension: Secondary | ICD-10-CM | POA: Diagnosis not present

## 2018-09-27 DIAGNOSIS — I480 Paroxysmal atrial fibrillation: Secondary | ICD-10-CM

## 2018-09-27 DIAGNOSIS — Z950 Presence of cardiac pacemaker: Secondary | ICD-10-CM | POA: Diagnosis not present

## 2018-09-27 DIAGNOSIS — I495 Sick sinus syndrome: Secondary | ICD-10-CM | POA: Diagnosis not present

## 2018-09-27 MED ORDER — WARFARIN SODIUM 5 MG PO TABS: 5.0000 mg | ORAL_TABLET | Freq: Every day | ORAL | 0 refills | Status: DC

## 2018-09-27 NOTE — Progress Notes (Deleted)
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  Since last being seen in our clinic, the patient reports doing very well.  Today, she denies symptoms of palpitations, chest pain, shortness of breath,  lower extremity edema, dizziness, presyncope, or syncope.  The patient is otherwise without complaint today.   Past Medical History:  Diagnosis Date  . Atrial flutter ([place])    a. mentioned in 2016 admission.  . Chronic diastolic CHF (congestive heart failure) ([place])   . DJD (degenerative joint disease)   . Fatty liver   . 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 HTN (hypertension)   . Hypercholesteremia   . Mild CAD    a. minimal by cath 2016.  . Nodule of chest wall   . Normal coronary arteries and LVF 10/23/14 10/24/2014  . Pacemaker implanted 10/25/14- St Jude 10/26/2014  . Paroxysmal atrial fibrillation (HCC)    a. identified on device interrogation, burden low  . Pulmonary hypertension ([place])   . Sick sinus syndrome (HCC)    a. s/p STJ dual chamber PPM   . Type II or unspecified type diabetes mellitus without mention of complication, not stated as uncontrolled   . Vitamin D deficiency    Past Surgical History:  Procedure Laterality Date  . BREAST SURGERY    . CATARACT EXTRACTION    . LEFT AND RIGHT HEART CATHETERIZATION WITH CORONARY ANGIOGRAM N/A 10/23/2014   Procedure: LEFT AND RIGHT HEART CATHETERIZATION WITH CORONARY ANGIOGRAM;  Surgeon: Blane Ohara, MD;  Location: [facility] CATH LAB;  Service: Cardiovascular;  Laterality: N/A;  . PERMANENT PACEMAKER INSERTION N/A 10/25/2014   STJ dual chamber PPM implanted by Dr Rayann Heman for SSS  . TONSILLECTOMY AND ADENOIDECTOMY    . TOTAL KNEE ARTHROPLASTY      ROS- all systems are reviewed and negatives except as per HPI above  Current Outpatient Medications  Medication Sig Dispense Refill  . apixaban (ELIQUIS) 5  MG TABS tablet Take 1 tablet (5 mg total) by mouth 2 (two) times daily. 60 tablet 11  . atorvastatin (LIPITOR) 80 MG tablet Take 1/2 to 1 tablet daily or as directed for Cholesterol (Patient taking differently: Take 1/2 tablet by mouth on Monday and Thursday) 30 tablet 1  . bisoprolol (ZEBETA) 10 MG tablet Take 1 tablet daily for BP 90 tablet 1  . blood glucose meter kit and supplies KIT Dispense based on patient and insurance preference. Check blood sugar 1 time a day-E11.21 1 each 0  . calcium carbonate (OS-CAL) 600 MG TABS tablet Take 600 mg by mouth daily with breakfast.    . cetirizine (ZYRTEC ALLERGY) 10 MG tablet Take 10 mg by mouth every other day.     . Cholecalciferol (VITAMIN D) 2000 UNITS CAPS Take 6,000 Units by mouth daily.     . furosemide (LASIX) 20 MG tablet Take 1 tablet (20 mg total) by mouth daily. 90 tablet 3  . gabapentin (NEURONTIN) 100 MG capsule TAKE 1 TO 2 CAPSULES BY MOUTH THREE TIMES DAILY FOR  NEUROPATHY  PAINS 540 capsule 3  . glucose blood (FREESTYLE LITE) test strip Check blood sugar 1 time daily-DX-E11.21 100 each 1  . Lancets (FREESTYLE) lancets USE 1 LANCET TO CHECK GLUCOSE ONCE DAILY  1  . Magnesium 500 MG TABS Take 500 mg by mouth 4 (four) times daily.     . metFORMIN (GLUCOPHAGE-XR) 500 MG  24 hr tablet TAKE 1000MG BY MOUTH TWICE DAILY 360 tablet 2  . potassium chloride SA (K-DUR,KLOR-CON) 20 MEQ tablet TAKE 1 TABLET BY MOUTH ONCE DAILY 90 tablet 1   No current facility-administered medications for this visit.     Physical Exam: Vitals:   09/27/18 1453  BP: 128/70  Pulse: 61  SpO2: 91%  Weight: 177 lb 12.8 oz (80.6 kg)  Height: _0  (1.473 m)    GEN- The patient is well appearing, alert and oriented x 3 today.   Head- normocephalic, atraumatic Eyes-  Sclera clear, conjunctiva pink Ears- hearing intact Oropharynx- clear Lungs- Clear to ausculation bilaterally, normal work of breathing Heart- Regular rate and rhythm, no murmurs, rubs or gallops,  PMI not laterally displaced GI- soft, NT, ND, + BS Extremities- no clubbing, cyanosis, or edema  Wt Readings from Last 3 Encounters:  09/27/18 177 lb 12.8 oz (80.6 kg)  08/21/18 181 lb 3.2 oz (82.2 kg)  06/02/18 175 lb (79.4 kg)    EKG tracing ordered today is personally reviewed and shows ***  Assessment and Plan:  ***   [name] MD, [facility] 09/27/2018 3:05 PM

## 2018-09-27 NOTE — Patient Instructions (Addendum)
Medication Instructions:  Your physician has recommended you make the following change in your medication:   1.  Stop taking Eliquis  2.  Start taking warfarin 5 mg one tablet by mouth daily  Labwork: None ordered.  Testing/Procedures: None ordered.  Follow-Up:  You have an new patient appointment with the Coumadin clinic on October 02, 2018 at 2:30 pm.     Your physician wants you to follow-up in: 3 months with the AFIB clinic.      Remote monitoring is used to monitor your Pacemaker from home. This monitoring reduces the number of office visits required to check your device to one time per year. It allows [place] to keep an eye on the functioning of your device to ensure it is working properly. You are scheduled for a device check from home on 11/28/2018. You may send your transmission at any time that day. If you have a wireless device, the transmission will be sent automatically. After your physician reviews your transmission, you will receive a postcard with your next transmission date.  Any Other Special Instructions Will Be Listed Below (If Applicable).  If you need a refill on your cardiac medications before your next appointment, please call your pharmacy.

## 2018-09-27 NOTE — Progress Notes (Signed)
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  Since last being seen in our clinic, the patient reports doing reasonably well. She had an episode of atrial fibrillation in December which was persistent for several days. She was asymptomatic and unaware of her arrhythmia. Seen 08/21/18 by Melina Copa and started on Eliquis 5 mg BID. She has had no further episodes of afib per device interrogation.  Today, she denies symptoms of palpitations, chest pain, shortness of breath,  lower extremity edema, dizziness, presyncope, or syncope.  The patient is otherwise without complaint today.   Past Medical History:  Diagnosis Date  . Atrial flutter ([place])    a. mentioned in 2016 admission.  . Chronic diastolic CHF (congestive heart failure) ([place])   . DJD (degenerative joint disease)   . Fatty liver   . 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 HTN (hypertension)   . Hypercholesteremia   . Mild CAD    a. minimal by cath 2016.  . Nodule of chest wall   . Normal coronary arteries and LVF 10/23/14 10/24/2014  . Pacemaker implanted 10/25/14- [employer] 10/26/2014  . Paroxysmal atrial fibrillation (HCC)    a. identified on device interrogation, burden low  . Pulmonary hypertension ([place])   . Sick sinus syndrome (HCC)    a. s/p STJ dual chamber PPM   . Type II or unspecified type diabetes mellitus without mention of complication, not stated as uncontrolled   . Vitamin D deficiency    Past Surgical History:  Procedure Laterality Date  . BREAST SURGERY    . CATARACT EXTRACTION    . LEFT AND RIGHT HEART CATHETERIZATION WITH CORONARY ANGIOGRAM N/A 10/23/2014   Procedure: LEFT AND RIGHT HEART CATHETERIZATION WITH CORONARY ANGIOGRAM;  Surgeon: Blane Ohara, MD;  Location: [facility] CATH LAB;  Service: Cardiovascular;  Laterality: N/A;  . PERMANENT PACEMAKER INSERTION N/A 10/25/2014     STJ dual chamber PPM implanted by Dr Rayann Heman for SSS  . TONSILLECTOMY AND ADENOIDECTOMY    . TOTAL KNEE ARTHROPLASTY      ROS- all systems are reviewed and negative except as per HPI above  Current Outpatient Medications  Medication Sig Dispense Refill  . atorvastatin (LIPITOR) 80 MG tablet Take 1/2 to 1 tablet daily or as directed for Cholesterol (Patient taking differently: Take 1/2 tablet by mouth on Monday and Thursday) 30 tablet 1  . bisoprolol (ZEBETA) 10 MG tablet Take 1 tablet daily for BP 90 tablet 1  . blood glucose meter kit and supplies KIT Dispense based on patient and insurance preference. Check blood sugar 1 time a day-E11.21 1 each 0  . calcium carbonate (OS-CAL) 600 MG TABS tablet Take 600 mg by mouth daily with breakfast.    . cetirizine (ZYRTEC ALLERGY) 10 MG tablet Take 10 mg by mouth every other day.     . Cholecalciferol (VITAMIN D) 2000 UNITS CAPS Take 6,000 Units by mouth daily.     . furosemide (LASIX) 20 MG tablet Take 1 tablet (20 mg total) by mouth daily. 90 tablet 3  . gabapentin (NEURONTIN) 100 MG capsule TAKE 1 TO 2 CAPSULES BY MOUTH THREE TIMES DAILY FOR  NEUROPATHY  PAINS 540 capsule 3  . glucose blood (FREESTYLE LITE) test strip Check blood sugar 1 time daily-DX-E11.21 100 each 1  . Lancets (FREESTYLE) lancets USE 1 LANCET TO CHECK GLUCOSE ONCE  DAILY  1  . Magnesium 500 MG TABS Take 500 mg by mouth 4 (four) times daily.     . metFORMIN (GLUCOPHAGE-XR) 500 MG 24 hr tablet TAKE '1000MG'$  BY MOUTH TWICE DAILY 360 tablet 2  . potassium chloride SA (K-DUR,KLOR-CON) 20 MEQ tablet TAKE 1 TABLET BY MOUTH ONCE DAILY 90 tablet 1  . warfarin (COUMADIN) 5 MG tablet Take 1 tablet (5 mg total) by mouth daily. 30 tablet 0   No current facility-administered medications for this visit.     Physical Exam: Vitals:   09/27/18 1453  BP: 128/70  Pulse: 61  SpO2: 91%  Weight: 177 lb 12.8 oz (80.6 kg)  Height: '4\' 10"'$  (1.473 m)    GEN- The patient is well appearing  elderly female, alert and oriented x 3 today.   Head- normocephalic, atraumatic Eyes-  Sclera clear, conjunctiva pink Ears- hearing intact Oropharynx- clear Lungs- Clear to ausculation bilaterally, normal work of breathing Chest- pacemaker pocket is well healed Heart- Regular rate and rhythm, no murmurs, rubs or gallops, PMI not laterally displaced GI- soft, NT, ND, + BS Extremities- no clubbing, cyanosis, or edema  Pacemaker interrogation- reviewed in detail today,  See PACEART report  ekg tracing not ordered today.  Assessment and Plan:  1. Symptomatic sinus bradycardia Normal pacemaker function See Pace Art report No changes today  2. Paroxysmal atrial fibrillation Device interrogation about 2% burden. Has been on Eliquis but unfortunately is too expensive. Will change to Coumadin and refer to clinic for management.  This patients CHA2DS2-VASc Score and unadjusted Ischemic Stroke Rate (% per year) is equal to 7.2 % stroke rate/year from a score of 5  Above score calculated as 1 point each if present [CHF, HTN, DM, Vascular=MI/PAD/Aortic Plaque, Age if 65-74, or Female] Above score calculated as 2 points each if present [Age > 75, or Stroke/TIA/TE]  3. Obesity Body mass index is 37.16 kg/m. Lifestyle modifications encouraged. She is limited 2/2 her hip pain.  4. HTN Stable, no changes today.  5. Snoring Patient admits to snoring and mildly elevated pulmonary pressures on echo. We discussed testing for OSA and she declined at this time.   Follow up in Coumadin clinic. Follow up in Afib clinic in 3 months and with me in 1 year.  [name] MD 09/27/2018 3:42 PM

## 2018-09-28 LAB — CUP PACEART INCLINIC DEVICE CHECK
Brady Statistic RA Percent Paced: 100 %
Brady Statistic RV Percent Paced: 100 %
Date Time Interrogation Session: 20200129205219
Implantable Lead Implant Date: 20160226
Implantable Lead Implant Date: 20160226
Implantable Lead Location: 753859
Implantable Lead Location: 753860
Implantable Lead Model: 1948
Implantable Pulse Generator Implant Date: 20160226
Lead Channel Impedance Value: 362.5 Ohm
Lead Channel Impedance Value: 462.5 Ohm
Lead Channel Pacing Threshold Amplitude: 0.625 V
Lead Channel Pacing Threshold Amplitude: 1.25 V
Lead Channel Pacing Threshold Pulse Width: 0.4 ms
Lead Channel Pacing Threshold Pulse Width: 0.4 ms
Lead Channel Pacing Threshold Pulse Width: 0.4 ms
Lead Channel Sensing Intrinsic Amplitude: 6.5 mV
Lead Channel Setting Pacing Amplitude: 2.5 V
Lead Channel Setting Pacing Amplitude: 5 V
Lead Channel Setting Pacing Pulse Width: 0.4 ms
Lead Channel Setting Sensing Sensitivity: 2 mV
MDC IDC MSMT BATTERY REMAINING LONGEVITY: 109 mo
MDC IDC MSMT BATTERY VOLTAGE: 2.99 V
MDC IDC MSMT LEADCHNL RV PACING THRESHOLD AMPLITUDE: 1.25 V
Pulse Gen Model: 2240
Pulse Gen Serial Number: 7700661

## 2018-10-02 ENCOUNTER — Ambulatory Visit (INDEPENDENT_AMBULATORY_CARE_PROVIDER_SITE_OTHER): Payer: Medicare Other | Admitting: Family Medicine

## 2018-10-02 ENCOUNTER — Encounter: Payer: Self-pay | Admitting: Family Medicine

## 2018-10-02 ENCOUNTER — Ambulatory Visit (INDEPENDENT_AMBULATORY_CARE_PROVIDER_SITE_OTHER): Payer: Medicare Other | Admitting: Pharmacist

## 2018-10-02 VITALS — BP 126/75 | HR 60 | Resp 17 | Ht 58.5 in | Wt 177.2 lb

## 2018-10-02 DIAGNOSIS — M1612 Unilateral primary osteoarthritis, left hip: Secondary | ICD-10-CM

## 2018-10-02 DIAGNOSIS — I10 Essential (primary) hypertension: Secondary | ICD-10-CM

## 2018-10-02 DIAGNOSIS — E1159 Type 2 diabetes mellitus with other circulatory complications: Secondary | ICD-10-CM | POA: Diagnosis not present

## 2018-10-02 DIAGNOSIS — Z7689 Persons encountering health services in other specified circumstances: Secondary | ICD-10-CM

## 2018-10-02 DIAGNOSIS — I480 Paroxysmal atrial fibrillation: Secondary | ICD-10-CM

## 2018-10-02 DIAGNOSIS — Z7901 Long term (current) use of anticoagulants: Secondary | ICD-10-CM | POA: Insufficient documentation

## 2018-10-02 DIAGNOSIS — I495 Sick sinus syndrome: Secondary | ICD-10-CM | POA: Diagnosis not present

## 2018-10-02 LAB — POCT INR: INR: 1.4 — AB (ref 2.0–3.0)

## 2018-10-02 LAB — GLUCOSE, POCT (MANUAL RESULT ENTRY): POC Glucose: 85 mg/dl (ref 70–99)

## 2018-10-02 MED ORDER — POTASSIUM CHLORIDE CRYS ER 20 MEQ PO TBCR: 20.0000 meq | EXTENDED_RELEASE_TABLET | Freq: Every day | ORAL | 1 refills | Status: DC

## 2018-10-02 MED ORDER — METFORMIN HCL ER 500 MG PO TB24: ORAL_TABLET | ORAL | 2 refills | Status: DC

## 2018-10-02 NOTE — Patient Instructions (Addendum)
Thank you for choosing Primary Care at [facility] to be your medical home!    Tracey Todd was seen by Joaquin Courts, FNP today.   Tracey Todd's primary care provider is Bing Neighbors, FNP.   For the best care possible, you should try to see Joaquin Courts, FNP-C whenever you come to the clinic.   We look forward to seeing you again soon!  If you have any questions about your visit today, please call us at [phone] or feel free to reach your primary care provider via MyChart.       Hip Pain  The hip is the joint between the upper legs and the lower pelvis. The bones, cartilage, tendons, and muscles of your hip joint support your body and allow you to move around. Hip pain can range from a minor ache to severe pain in one or both of your hips. The pain may be felt on the inside of the hip joint near the groin, or the outside near the buttocks and upper thigh. You may also have swelling or stiffness. Follow these instructions at home: Managing pain, stiffness, and swelling  If directed, apply ice to the injured area. ? Put ice in a plastic bag. ? Place a towel between your skin and the bag. ? Leave the ice on for 20 minutes, 2-3 times a day  Sleep with a pillow between your legs on your most comfortable side.  Avoid any activities that cause pain. General instructions  Take over-the-counter and prescription medicines only as told by your health care provider.  Do any exercises as told by your health care provider.  Record the following: ? How often you have hip pain. ? The location of your pain. ? What the pain feels like. ? What makes the pain worse.  Keep all follow-up visits as told by your health care provider. This is important. Contact a health care provider if:  You cannot put weight on your leg.  Your pain or swelling continues or gets worse after one week.  It gets harder to walk.  You have a fever. Get help right away if:  You  fall.  You have a sudden increase in pain and swelling in your hip.  Your hip is red or swollen or very tender to touch. Summary  Hip pain can range from a minor ache to severe pain in one or both of your hips.  The pain may be felt on the inside of the hip joint near the groin, or the outside near the buttocks and upper thigh.  Avoid any activities that cause pain.  Record how often you have hip pain, the location of the pain, what makes it worse and what it feels like. This information is not intended to replace advice given to you by your health care provider. Make sure you discuss any questions you have with your health care provider. Document Released: 02/03/2010 Document Revised: 07/19/2016 Document Reviewed: 07/19/2016 Elsevier Interactive Patient Education  2019 [employer].

## 2018-10-02 NOTE — Progress Notes (Signed)
Tracey Todd, is a 84 y.o. female  ZOX:[id number]  [id number]  DOB - 06/22/1935  CC:  Chief Complaint  Patient presents with  . Establish Care  . Diabetes  . Hypertension  . Hyperlipidemia       HPI: Tracey Todd is a 84 y.o. female is here today to establish care.   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); Atherosclerosis of aorta ([place]); and Diabetic neuropathy ([place]) on their problem list.    Today's visit:  The patient is here today to establish care.  She has an extensive medical history including : stable type 2 diabetes , last  A1C 6.07 June 2018.  A1c's have consistently ranged within the 6.0-6.9 range.  She was prescribed metformin patiently checks her blood sugar. She is followed by cardiology and has an ICD in place for sick sinus syndrome and heart failure. She also suffers from proximal atrial fibrillation and is chronically anticoagulated with warfarin.  She attends the anticoagulation clinic for monitoring of PT/INR.  She also suffers from hypertension which is been very well controlled on current medication regimen.  She is normotensive today.  She is up-to-date with all immunizations including influenza vaccination for this season.  She reports that she routinely engages in routine physical activity however has been not active recently but plans to resume walking next month at a gym. Current Body mass index is 36.4 kg/m. She is a non-smoker.   She complains of left hip pain which has been a chronic issue.  She reports the pain had been intermittently occurring but of recent the pain has gotten worse to the point that she fears  falling.  She denies any recent falls.  She is ambulating with a cane due to this problem.  In review of EMR/care everywhere, imaging performed 03/06/2014 indicated left hip degenerative disease.  She is requesting a referral to orthopedic specialty today.   Patien denies new headaches, chest pain, abdominal pain, nausea, new weakness , numbness or tingling, SOB, edema, or worrisome cough.    Current medications: Current Outpatient Medications:  .  atorvastatin (LIPITOR) 80 MG tablet, Take 1/2 to 1 tablet daily or as directed for Cholesterol (Patient taking differently: Take 1/2 tablet by mouth on Monday and Thursday), Disp: 30 tablet, Rfl: 1 .  bisoprolol (ZEBETA) 10 MG tablet, Take 1 tablet daily for BP, Disp: 90 tablet, Rfl: 1 .  blood glucose meter kit and supplies KIT, Dispense based on patient and insurance preference. Check blood sugar 1 time a day-E11.21, Disp: 1 each, Rfl: 0 .  calcium carbonate (OS-CAL) 600 MG TABS tablet, Take 600 mg by mouth daily with breakfast., Disp: , Rfl:  .  cetirizine (ZYRTEC ALLERGY) 10 MG tablet, Take 10 mg by mouth every other day. , Disp: , Rfl:  .  Cholecalciferol (VITAMIN D) 2000 UNITS CAPS, Take 6,000 Units by mouth daily. , Disp: , Rfl:  .  furosemide (LASIX) 20 MG tablet, Take 1 tablet (20 mg total) by mouth daily., Disp: 90 tablet, Rfl: 3 .  gabapentin (NEURONTIN) 100 MG capsule, TAKE 1 TO 2 CAPSULES BY MOUTH THREE TIMES DAILY FOR  NEUROPATHY  PAINS, Disp: 540 capsule, Rfl: 3 .  glucose blood (FREESTYLE LITE) test strip, Check blood sugar  1 time daily-DX-E11.21, Disp: 100 each, Rfl: 1 .  Lancets (FREESTYLE) lancets, USE 1 LANCET TO CHECK GLUCOSE ONCE DAILY, Disp: , Rfl: 1 .  Magnesium 500 MG TABS, Take 500 mg by mouth 4 (four) times daily. , Disp: , Rfl:  .  metFORMIN (GLUCOPHAGE-XR) 500 MG 24 hr tablet, TAKE '1000MG'$  BY MOUTH TWICE DAILY, Disp: 360 tablet, Rfl: 2 .  potassium chloride SA (K-DUR,KLOR-CON) 20 MEQ tablet, Take 1 tablet (20 mEq total) by  mouth daily., Disp: 90 tablet, Rfl: 1 .  warfarin (COUMADIN) 5 MG tablet, Take 1 tablet (5 mg total) by mouth daily., Disp: 30 tablet, Rfl: 0   Pertinent family medical history: family history includes Cancer in her father and mother; Diabetes in her mother.   Allergies  Allergen Reactions  . Penicillins Anaphylaxis  . Quinapril     Angioedema   . Feldene [Piroxicam] Other (See Comments)    Bleeding   . Calcium-Containing Compounds Other (See Comments)    "calcium deposits on skin"  . Celebrex [Celecoxib] Itching  . Daypro [Oxaprozin] Itching    Social History   Socioeconomic History  . Marital status: Married    Spouse name: Not on file  . Number of children: Not on file  . Years of education: Not on file  . Highest education level: Not on file  Occupational History  . Not on file  Social Needs  . Financial resource strain: Not on file  . Food insecurity:    Worry: Not on file    Inability: Not on file  . Transportation needs:    Medical: Not on file    Non-medical: Not on file  Tobacco Use  . Smoking status: Former Smoker    Last attempt to quit: 12/06/1963    Years since quitting: 54.8  . Smokeless tobacco: Never Used  Substance and Sexual Activity  . Alcohol use: No  . Drug use: Never  . Sexual activity: Not on file  Lifestyle  . Physical activity:    Days per week: Not on file    Minutes per session: Not on file  . Stress: Not on file  Relationships  . Social connections:    Talks on phone: Not on file    Gets together: Not on file    Attends religious service: Not on file    Active member of club or organization: Not on file    Attends meetings of clubs or organizations: Not on file    Relationship status: Not on file  . Intimate partner violence:    Fear of current or ex partner: Not on file    Emotionally abused: Not on file    Physically abused: Not on file    Forced sexual activity: Not on file  Other Topics Concern  . Not on file  Social  History Narrative  . Not on file    Review of Systems: Pertinent negatives listed in HPI Objective:   Vitals:   10/02/18 1043  BP: 126/75  Pulse: 60  Resp: 17  SpO2: 95%    BP Readings from Last 3 Encounters:  10/02/18 126/75  09/27/18 128/70  08/21/18 138/68    Filed Weights   10/02/18 1043  Weight: 177 lb 3.2 oz (80.4 kg)      Physical Exam: Constitutional: Patient appears well-developed and well-nourished. No distress. HENT: Normocephalic, atraumatic, External right and left ear normal.  Eyes: Conjunctivae and EOM are normal. PERRLA, no scleral icterus. Neck: Normal ROM. Neck supple. No JVD.  No tracheal deviation. No thyromegaly. CVS: RRR, S1/S2 +, no murmurs, no gallops, no carotid bruit.  Pulmonary: Effort and breath sounds normal, no stridor, rhonchi, wheezes, rales.  Abdominal: Soft. BS +, no distension, tenderness, rebound or guarding.  Musculoskeletal: Impaired bilateral extremity normal range of motion.  Ambulates with a cane. No edema.  Positive for left hip tenderness.  Neuro: Alert. Normal muscle tone coordination. Normal gait. BUE and BLE strength 5/5. Bilateral hand grips symmetrical. Skin: Skin is warm and dry. No rash noted. Not diaphoretic. No erythema. No pallor. Psychiatric: Normal mood and affect. Behavior, judgment, thought content normal.  Lab Results (prior encounters)  Lab Results  Component Value Date   WBC 6.7 08/22/2018   HGB 11.6 08/22/2018   HCT 35.3 08/22/2018   MCV 83 08/22/2018   PLT 300 08/22/2018   Lab Results  Component Value Date   CREATININE 0.78 08/29/2018   BUN 17 08/29/2018   NA 140 08/29/2018   K 3.9 08/29/2018   CL 100 08/29/2018   CO2 24 08/29/2018    Lab Results  Component Value Date   HGBA1C 6.9 (H) 06/02/2018       Component Value Date/Time   CHOL 165 06/02/2018 0950   TRIG 57 06/02/2018 0950   HDL 66 06/02/2018 0950   CHOLHDL 2.5 06/02/2018 0950   VLDL 15 03/28/2017 0907   LDLCALC 86 06/02/2018  0950        Assessment and plan:  1. Encounter to establish care 2. Type 2 diabetes mellitus with other circulatory complication, unspecified whether long term insulin use (HCC) Diabetes has been historically stable.  Last A1c October 2019 A1c had increased from 6.4-6.9.  She intends to resume routine physical exercise and has been monitoring intake of sweets.  Will repeat A1c today, will have patient return in 3 months. - Glucose (CBG)-85 - Hemoglobin A1c  3. Osteoarthritis of left hip, unspecified osteoarthritis type - Ambulatory referral to Orthopedic Surgery  4. PAF-fib and flutter Currently anticoagulated and followed by cardiology.  5. Sick sinus syndrome ([place]) ICD in place.  Continue follow-up with cardiology  6. Essential hypertension, controlled We have discussed target BP range and blood pressure goal. I have advised patient to check BP regularly and to call us back or report to clinic if the numbers are consistently higher than 140/90. We discussed the importance of compliance with medical therapy and DASH diet recommended, consequences of uncontrolled hypertension discussed.  - Continue current BP medications   Return in about 3 months (around 12/31/2018) for diabetes .   The patient was given clear instructions to go to ER or return to medical center if symptoms don't improve, worsen or new problems develop. The patient verbalized understanding. The patient was advised  to call and obtain lab results if they haven't heard anything from out office within 7-10 business days.  Molli Barrows, FNP Primary Care at [facility] [address], [place] [address] [phone]: [phone]   This note has been created with Dragon speech recognition software and [employer], materials Any transcriptional errors are unintentional.

## 2018-10-02 NOTE — Patient Instructions (Addendum)
Take 2 tablets tonight, then take 1.5 tablets tomorrow then continue taking 5mg  daily. Recheck in 1 week. Call us at [phone] with any new medications, upcoming procedures, or questions.   Information on my medicine - Coumadin   (Warfarin)  Why was Coumadin prescribed for you? Coumadin was prescribed for you because you have a blood clot or a medical condition that can cause an increased risk of forming blood clots. Blood clots can cause serious health problems by blocking the flow of blood to the heart, lung, or brain. Coumadin can prevent harmful blood clots from forming. As a reminder your indication for Coumadin is:   Stroke Prevention Because Of Atrial Fibrillation  What test will check on my response to Coumadin? While on Coumadin (warfarin) you will need to have an INR test regularly to ensure that your dose is keeping you in the desired range. The INR (international normalized ratio) number is calculated from the result of the laboratory test called prothrombin time (PT).  What  do you need to  know  About  COUMADIN? Take Coumadin (warfarin) exactly as prescribed by your healthcare provider about the same time each day.  DO NOT stop taking without talking to the doctor who prescribed the medication.  Stopping without other blood clot prevention medication to take the place of Coumadin may increase your risk of developing a new clot or stroke.  Get refills before you run out.  What do you do if you miss a dose? If you miss a dose, take it as soon as you remember on the same day then continue your regularly scheduled regimen the next day.  Do not take two doses of Coumadin at the same time.  Important Safety Information A possible side effect of Coumadin (Warfarin) is an increased risk of bleeding. You should call your healthcare provider right away if you experience any of the following: ? Bleeding from an injury or your nose that does not stop. ? Unusual colored urine (red or dark  brown) or unusual colored stools (red or black). ? Unusual bruising for unknown reasons. ? A serious fall or if you hit your head (even if there is no bleeding).  Some foods or medicines interact with Coumadin (warfarin) and might alter your response to warfarin. To help avoid this: ? Eat a balanced diet, maintaining a consistent amount of Vitamin K. ? Notify your provider about major diet changes you plan to make. ? Avoid alcohol or limit your intake to 1 drink for women and 2 drinks for men per day. (1 drink is 5 oz. wine, 12 oz. beer, or 1.5 oz. liquor.)  Make sure that ANY health care provider who prescribes medication for you knows that you are taking Coumadin (warfarin).  Also make sure the healthcare provider who is monitoring your Coumadin knows when you have started a new medication including herbals and non-prescription products.  Coumadin (Warfarin)  Major Drug Interactions  Increased Warfarin Effect Decreased Warfarin Effect  Alcohol (large quantities) Antibiotics (esp. Septra/Bactrim, Flagyl, Cipro) Amiodarone (Cordarone) Aspirin (ASA) Cimetidine (Tagamet) Megestrol (Megace) NSAIDs (ibuprofen, naproxen, etc.) Piroxicam (Feldene) Propafenone (Rythmol SR) Propranolol (Inderal) Isoniazid (INH) Posaconazole (Noxafil) Barbiturates (Phenobarbital) Carbamazepine (Tegretol) Chlordiazepoxide (Librium) Cholestyramine (Questran) Griseofulvin Oral Contraceptives Rifampin Sucralfate (Carafate) Vitamin K   Coumadin (Warfarin) Major Herbal Interactions  Increased Warfarin Effect Decreased Warfarin Effect  Garlic Ginseng Ginkgo biloba Coenzyme Q10 Green tea St. John's wort    Coumadin (Warfarin) FOOD Interactions  Eat a consistent number of servings per week of  foods HIGH in Vitamin K (1 serving =  cup)  Collards (cooked, or boiled & drained) Kale (cooked, or boiled & drained) Mustard greens (cooked, or boiled & drained) Parsley *serving size only =  cup Spinach  (cooked, or boiled & drained) Swiss chard (cooked, or boiled & drained) Turnip greens (cooked, or boiled & drained)  Eat a consistent number of servings per week of foods MEDIUM-HIGH in Vitamin K (1 serving = 1 cup)  Asparagus (cooked, or boiled & drained) Broccoli (cooked, boiled & drained, or raw & chopped) Brussel sprouts (cooked, or boiled & drained) *serving size only =  cup Lettuce, raw (green leaf, endive, romaine) Spinach, raw Turnip greens, raw & chopped   These websites have more information on Coumadin (warfarin):  http://www.king-russell.com/; https://www.hines.net/;

## 2018-10-03 LAB — HEMOGLOBIN A1C
Est. average glucose Bld gHb Est-mCnc: 146 mg/dL
Hgb A1c MFr Bld: 6.7 % — ABNORMAL HIGH (ref 4.8–5.6)

## 2018-10-04 ENCOUNTER — Encounter: Payer: Self-pay | Admitting: Podiatry

## 2018-10-04 ENCOUNTER — Ambulatory Visit (INDEPENDENT_AMBULATORY_CARE_PROVIDER_SITE_OTHER): Payer: Medicare Other | Admitting: Podiatry

## 2018-10-04 DIAGNOSIS — D689 Coagulation defect, unspecified: Secondary | ICD-10-CM | POA: Diagnosis not present

## 2018-10-04 DIAGNOSIS — M79676 Pain in unspecified toe(s): Secondary | ICD-10-CM | POA: Diagnosis not present

## 2018-10-04 DIAGNOSIS — E1121 Type 2 diabetes mellitus with diabetic nephropathy: Secondary | ICD-10-CM | POA: Diagnosis not present

## 2018-10-04 DIAGNOSIS — B351 Tinea unguium: Secondary | ICD-10-CM

## 2018-10-04 NOTE — Progress Notes (Signed)
Patient ID: Tracey Todd, female   DOB: 03/03/1935, 83 y.o.   MRN: [id number] Complaint:  Visit Type: Patient returns to my office for continued preventative foot care services. Complaint: Patient states" my nails have grown long and thick and become painful to walk and wear shoes" Patient has been diagnosed with DM with no foot complications... The patient presents for preventative foot care services. No changes to ROS.  Patient is now taking coumadin.  Podiatric Exam: Vascular: dorsalis pedis and posterior tibial pulses are palpable bilateral. Capillary return is immediate. Temperature gradient is WNL. Skin turgor WNL  Sensorium: Diminished  Semmes Weinstein monofilament test. Normal tactile sensation bilaterally. Nail Exam: Pt has thick disfigured discolored nails with subungual debris noted bilateral entire nail hallux through fifth toenails Ulcer Exam: There is no evidence of ulcer or pre-ulcerative changes or infection. Orthopedic Exam: Muscle tone and strength are WNL. No limitations in general ROM. No crepitus or effusions noted. Foot type and digits show no abnormalities. Mild dorsomedial exostosis noted. Skin: No Porokeratosis. No infection or ulcers  Diagnosis:  Onychomycosis, , Pain in right toe, pain in left toes  Treatment & Plan Procedures and Treatment: Consent by patient was obtained for treatment procedures. The patient understood the discussion of treatment and procedures well. All questions were answered thoroughly reviewed. Debridement of mycotic and hypertrophic toenails, 1 through 5 bilateral and clearing of subungual debris. No ulceration, no infection noted. Minimal mechanical debridement requested.  . Return Visit-Office Procedure: Patient instructed to return to the office for a follow up visit 3 months for continued evaluation and treatment.    Denis Carreon DPM 

## 2018-10-09 ENCOUNTER — Ambulatory Visit (INDEPENDENT_AMBULATORY_CARE_PROVIDER_SITE_OTHER): Payer: Medicare Other | Admitting: *Deleted

## 2018-10-09 ENCOUNTER — Ambulatory Visit (INDEPENDENT_AMBULATORY_CARE_PROVIDER_SITE_OTHER): Payer: Medicare Other

## 2018-10-09 ENCOUNTER — Ambulatory Visit (INDEPENDENT_AMBULATORY_CARE_PROVIDER_SITE_OTHER): Payer: Medicare Other | Admitting: Orthopaedic Surgery

## 2018-10-09 ENCOUNTER — Other Ambulatory Visit (INDEPENDENT_AMBULATORY_CARE_PROVIDER_SITE_OTHER): Payer: Self-pay | Admitting: Orthopaedic Surgery

## 2018-10-09 ENCOUNTER — Encounter (INDEPENDENT_AMBULATORY_CARE_PROVIDER_SITE_OTHER): Payer: Self-pay | Admitting: Orthopaedic Surgery

## 2018-10-09 VITALS — BP 141/75 | HR 75 | Ht <= 58 in | Wt 175.0 lb

## 2018-10-09 DIAGNOSIS — Z7901 Long term (current) use of anticoagulants: Secondary | ICD-10-CM

## 2018-10-09 DIAGNOSIS — M25552 Pain in left hip: Secondary | ICD-10-CM | POA: Diagnosis not present

## 2018-10-09 DIAGNOSIS — I480 Paroxysmal atrial fibrillation: Secondary | ICD-10-CM

## 2018-10-09 LAB — POCT INR: INR: 1.9 — AB (ref 2.0–3.0)

## 2018-10-09 MED ORDER — WARFARIN SODIUM 5 MG PO TABS: ORAL_TABLET | ORAL | 0 refills | Status: DC

## 2018-10-09 NOTE — Progress Notes (Signed)
Office Visit Note   Patient: Tracey Todd           Date of Birth: 04/08/1935           MRN: [id number] Visit Date: 10/09/2018              Requested by: Bing NeighborsHarris, Kimberly S, FNP [address] [place], [place] [address] PCP: Bing NeighborsHarris, Kimberly S, FNP   Assessment & Plan: Visit Diagnoses:  1. Pain in left hip     Plan: Osteoarthritis left hip but I suspect is creating her symptoms.  From a diagnostic and therapeutic standpoint will have the hip injected with cortisone and monitor response.  Also consider further evaluation of her lumbar spine as a potential source of her pain  Follow-Up Instructions: Return in about 1 month (around 11/07/2018).   Orders:  Orders Placed This Encounter  Procedures  . XR HIP UNILAT W OR W/O PELVIS 2-3 VIEWS LEFT   No orders of the defined types were placed in this encounter.     Procedures: No procedures performed   Clinical Data: No additional findings.   Subjective: Chief Complaint  Patient presents with  . Left Hip - Pain  Patient presents today with left hip pain X2 months. No known injury. She said that she has pain in her groin and into her thigh. The pain seems to be constant, but weightbearing makes it worse. She said that her leg feels weak. She is taking Gabapentin for pain. The pain does not seem to keep her awake at night. She has noticed a rash on her left side above her buttock that is painful on and off. Long history of diabetes with neuropathy particularly involving her feet with burning and occasional pain.  Having some buttock pain and mild low back discomfort but predominantly left groin pain that will on occasion compromise her ambulation  HPI  Review of Systems  Constitutional: Positive for fatigue.  HENT: Negative for ear pain.   Eyes: Negative for pain.  Respiratory: Negative for shortness of breath.   Cardiovascular: Positive for leg swelling.  Gastrointestinal: Positive for diarrhea. Negative for  constipation.  Endocrine: Negative for cold intolerance and heat intolerance.  Genitourinary: Negative for difficulty urinating.  Musculoskeletal: Negative for joint swelling.  Skin: Positive for rash.  Allergic/Immunologic: Negative for food allergies.  Neurological: Positive for weakness.  Hematological: Does not bruise/bleed easily.  Psychiatric/Behavioral: Negative for sleep disturbance.     Objective: Vital Signs: BP (!) 141/75   Pulse 75   Ht 4\' 10"  (1.473 m)   Wt 175 lb (79.4 kg)   BMI 36.58 kg/m   Physical Exam Constitutional:      Appearance: She is well-developed.  Eyes:     Pupils: Pupils are equal, round, and reactive to light.  Pulmonary:     Effort: Pulmonary effort is normal.  Skin:    General: Skin is warm and dry.  Neurological:     Mental Status: She is alert and oriented to person, place, and time.  Psychiatric:        Behavior: Behavior normal.     Ortho Exam awake alert and oriented x3.  Comfortable sitting.  Straight leg raise negative.  No pain with range of motion of her right hip but definitely had some pain in the extreme of internal/external rotation of her left hip.  Some burning and tingling in both of her feet related to her neuropathy.  No distal edema.  Motor exam appeared to  be intact.  Very minimal percussible tenderness of the lumbar spine Specialty Comments:  No specialty comments available.  Imaging: Xr Hip Unilat W Or W/o Pelvis 2-3 Views Left  Result Date: 10/09/2018 Films of the pelvis were obtained demonstrating degenerative changes in both hips.  The changes are more distinct in the left compared to the right hip.  There is joint space narrowing and small cyst formation in the subchondral space of the femoral head.  Similar findings on the right asymptomatic hip but to a lesser extent.  Diffuse bowel gas obliterate some of the details.  Some degenerative change at L5-S1 based on that one film    PMFS History: Patient Active  Problem List   Diagnosis Date Noted  . Long term (current) use of anticoagulants 10/02/2018  . Atherosclerosis of aorta (HCC) 07/20/2017  . Diabetic neuropathy (HCC) 07/20/2017  . Moderate to severe pulmonary hypertension (HCC) 11/20/2015  . BMI 34.0-34.9,adult 08/06/2015  . Diabetes mellitus type 2, controlled (HCC) 04/25/2015  . Sick sinus syndrome (HCC) 01/29/2015  . Essential hypertension 10/22/2014  . PAF-fib and flutter 10/22/2014  . Congestive heart failure (HCC) 10/21/2014  . 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 Obesity 07/08/2014  . Vitamin D deficiency 09/28/2013  . Medication management 09/28/2013  . Hyperlipidemia   . DJD (degenerative joint disease)   . Fatty liver    Past Medical History:  Diagnosis Date  . Atrial flutter (HCC)    a. mentioned in 2016 admission.  . Chronic diastolic CHF (congestive heart failure) (HCC)   . DJD (degenerative joint disease)   . Fatty liver   . 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 HTN (hypertension)   . Hypercholesteremia   . Mild CAD    a. minimal by cath 2016.  . Nodule of chest wall   . Normal coronary arteries and LVF 10/23/14 10/24/2014  . Pacemaker implanted 10/25/14- St Jude 10/26/2014  . Paroxysmal atrial fibrillation (HCC)    a. identified on device interrogation, burden low  . Pulmonary hypertension (HCC)   . Sick sinus syndrome (HCC)    a. s/p STJ dual chamber PPM   . Type II or unspecified type diabetes mellitus without mention of complication, not stated as uncontrolled   . Vitamin D deficiency     Family History  Problem Relation Age of Onset  . Diabetes Mother   . Cancer Mother   . Cancer Father     Past Surgical History:  Procedure Laterality Date  . BREAST SURGERY    . CATARACT EXTRACTION    . LEFT AND RIGHT HEART CATHETERIZATION WITH CORONARY ANGIOGRAM N/A 10/23/2014   Procedure: LEFT AND RIGHT HEART CATHETERIZATION WITH CORONARY ANGIOGRAM;  Surgeon: Micheline Chapman, MD;  Location: [facility]  CATH LAB;  Service: Cardiovascular;  Laterality: N/A;  . PERMANENT PACEMAKER INSERTION N/A 10/25/2014   STJ dual chamber PPM implanted by Dr [name] for SSS  . TONSILLECTOMY AND ADENOIDECTOMY    . TOTAL KNEE ARTHROPLASTY     Social History   Occupational History  . Not on file  Tobacco Use  . Smoking status: Former Smoker    Last attempt to quit: 12/06/1963    Years since quitting: 54.8  . Smokeless tobacco: Never Used  Substance and Sexual Activity  . Alcohol use: No  . Drug use: Never  . Sexual activity: Not on file

## 2018-10-09 NOTE — Patient Instructions (Signed)
Description   Take 7.5mg  tonight, then start taking 5mg  daily except 7.5mg  on Saturdays. Recheck in 1 week. Call us at [phone] with any new medications, upcoming procedures, or questions.

## 2018-10-10 ENCOUNTER — Ambulatory Visit
Admission: EM | Admit: 2018-10-10 | Discharge: 2018-10-10 | Disposition: A | Discharge status: Home / Self Care | Payer: Medicare Other | Attending: Nurse Practitioner | Admitting: Nurse Practitioner

## 2018-10-10 ENCOUNTER — Telehealth (INDEPENDENT_AMBULATORY_CARE_PROVIDER_SITE_OTHER): Payer: Self-pay | Admitting: Orthopaedic Surgery

## 2018-10-10 DIAGNOSIS — B029 Zoster without complications: Secondary | ICD-10-CM

## 2018-10-10 MED ORDER — VALACYCLOVIR HCL 1 G PO TABS: 1000.0000 mg | ORAL_TABLET | Freq: Three times a day (TID) | ORAL | 0 refills | Status: DC

## 2018-10-10 NOTE — ED Triage Notes (Signed)
Pt c/o a painful rash to lt side since saturday

## 2018-10-10 NOTE — ED Provider Notes (Signed)
[name]    CSN: [id number] Arrival date & time: 10/10/18  1142     History   Chief Complaint Chief Complaint  Patient presents with  . Rash    HPI Tracey Todd is a 84 y.o. female.   Subjective:   Tracey Todd is a 84 y.o. female who presents for evaluation of rash. Rash started 2 days ago. Initial distribution: Left flank area and now there is another area under the left panniculus. Lesions are pink in color, are of raised texture. Rash has not changed over time.  Rash is painful and is pruritic. Associated symptoms: none. Patient denies: abdominal pain, arthralgia, fever and myalgia. Patient has not had previous evaluation of rash. Patient has not had previous treatment. Patient has not had contacts with similar rash. Patient has not had new exposures.  The following portions of the patient's history were reviewed and updated as appropriate: allergies, current medications, past family history, past medical history, past social history, past surgical history and problem list.        Past Medical History:  Diagnosis Date  . Atrial flutter ([place])    a. mentioned in 2016 admission.  . Chronic diastolic CHF (congestive heart failure) ([place])   . DJD (degenerative joint disease)   . Fatty liver   . 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 HTN (hypertension)   . Hypercholesteremia   . Mild CAD    a. minimal by cath 2016.  . Nodule of chest wall   . Normal coronary arteries and LVF 10/23/14 10/24/2014  . Pacemaker implanted 10/25/14- St Jude 10/26/2014  . Paroxysmal atrial fibrillation (HCC)    a. identified on device interrogation, burden low  . Pulmonary hypertension ([place])   . Sick sinus syndrome (HCC)    a. s/p STJ dual chamber PPM   . Type II or unspecified type diabetes mellitus without mention of complication, not stated as uncontrolled   . Vitamin D deficiency     Patient Active Problem List   Diagnosis Date Noted  . Long term (current) use of  anticoagulants 10/02/2018  . Atherosclerosis of aorta ([place]) 07/20/2017  . Diabetic neuropathy ([place]) 07/20/2017  . Moderate to severe pulmonary hypertension ([place]) 11/20/2015  . BMI 34.0-34.9,adult 08/06/2015  . Diabetes mellitus type 2, controlled ([place]) 04/25/2015  . Sick sinus syndrome ([place]) 01/29/2015  . Essential hypertension 10/22/2014  . PAF-fib and flutter 10/22/2014  . Congestive heart failure ([place]) 10/21/2014  . 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 Obesity 07/08/2014  . Vitamin D deficiency 09/28/2013  . Medication management 09/28/2013  . Hyperlipidemia   . DJD (degenerative joint disease)   . Fatty liver     Past Surgical History:  Procedure Laterality Date  . BREAST SURGERY    . CATARACT EXTRACTION    . LEFT AND RIGHT HEART CATHETERIZATION WITH CORONARY ANGIOGRAM N/A 10/23/2014   Procedure: LEFT AND RIGHT HEART CATHETERIZATION WITH CORONARY ANGIOGRAM;  Surgeon: Blane Ohara, MD;  Location: [facility] CATH LAB;  Service: Cardiovascular;  Laterality: N/A;  . PERMANENT PACEMAKER INSERTION N/A 10/25/2014   STJ dual chamber PPM implanted by Dr Rayann Heman for SSS  . TONSILLECTOMY AND ADENOIDECTOMY    . TOTAL KNEE ARTHROPLASTY      OB History   No obstetric history on file.      Home Medications    Prior to Admission medications   Medication Sig Start Date End Date Taking? Authorizing Provider  atorvastatin (LIPITOR) 80  MG tablet Take 1/2 to 1 tablet daily or as directed for Cholesterol Patient taking differently: Take 1/2 tablet by mouth on Monday and Thursday 03/31/17   Unk Pinto, MD  bisoprolol (ZEBETA) 10 MG tablet Take 1 tablet daily for BP 06/07/18   Unk Pinto, MD  blood glucose meter kit and supplies KIT Dispense based on patient and insurance preference. Check blood sugar 1 time a day-E11.21 04/05/18   Unk Pinto, MD  calcium carbonate (OS-CAL) 600 MG TABS tablet Take 600 mg by mouth daily with breakfast.    [provider]    cetirizine (ZYRTEC ALLERGY) 10 MG tablet Take 10 mg by mouth every other day.  11/28/12   [provider]  Cholecalciferol (VITAMIN D) 2000 UNITS CAPS Take 6,000 Units by mouth daily.     [provider]  furosemide (LASIX) 20 MG tablet Take 1 tablet (20 mg total) by mouth daily. 08/21/18 11/19/18  Dunn, Nedra Hai, PA-C  gabapentin (NEURONTIN) 100 MG capsule TAKE 1 TO 2 CAPSULES BY MOUTH THREE TIMES DAILY FOR  NEUROPATHY  PAINS 05/21/18   Unk Pinto, MD  glucose blood (FREESTYLE LITE) test strip Check blood sugar 1 time daily-DX-E11.21 04/10/18   Unk Pinto, MD  Lancets (FREESTYLE) lancets USE 1 LANCET TO CHECK GLUCOSE ONCE DAILY 06/08/18   [provider]  Magnesium 500 MG TABS Take 500 mg by mouth 4 (four) times daily.     [provider]  metFORMIN (GLUCOPHAGE-XR) 500 MG 24 hr tablet TAKE 1000MG BY MOUTH TWICE DAILY 10/02/18   Scot Jun, FNP  potassium chloride SA (K-DUR,KLOR-CON) 20 MEQ tablet Take 1 tablet (20 mEq total) by mouth daily. 10/02/18   Scot Jun, FNP  valACYclovir (VALTREX) 1000 MG tablet Take 1 tablet (1,000 mg total) by mouth 3 (three) times daily. 10/10/18   Enrique Sack, FNP  warfarin (COUMADIN) 5 MG tablet Take 1 tab daily except 1.5 tabs on Saturday or as directed by [facility]. 10/09/18   [name], MD    Family History Family History  Problem Relation Age of Onset  . Diabetes Mother   . Cancer Mother   . Cancer Father     Social History Social History   Tobacco Use  . Smoking status: Former Smoker    Last attempt to quit: 12/06/1963    Years since quitting: 54.8  . Smokeless tobacco: Never Used  Substance Use Topics  . Alcohol use: No  . Drug use: Never     Allergies   Penicillins; Quinapril; Feldene [piroxicam]; Calcium-containing compounds; Celebrex [celecoxib]; and Daypro [oxaprozin]   Review of Systems Review of Systems  Constitutional: Negative.   HENT: Negative.   Respiratory:  Negative.   Musculoskeletal: Negative.   Skin: Positive for rash.  Neurological: Negative.   All other systems reviewed and are negative.    Physical Exam Triage Vital Signs ED Triage Vitals  Enc Vitals Group     BP 10/10/18 1234 (!) 158/90     Pulse Rate 10/10/18 1234 70     Resp 10/10/18 1234 18     Temp 10/10/18 1234 98.2 F (36.8 C)     Temp Source 10/10/18 1234 Oral     SpO2 10/10/18 1234 93 %     Weight --      Height --      Head Circumference --      Peak Flow --      Pain Score 10/10/18 1235 5     Pain  Loc --      Pain Edu? --      Excl. in [place]? --    No data found.  Updated Vital Signs BP (!) 158/90 (BP Location: Left Arm)   Pulse 70   Temp 98.2 F (36.8 C) (Oral)   Resp 18   SpO2 93%   Visual Acuity Right Eye Distance:   Left Eye Distance:   Bilateral Distance:    Right Eye Near:   Left Eye Near:    Bilateral Near:     Physical Exam Vitals signs reviewed.  Constitutional:      General: She is not in acute distress.    Appearance: Normal appearance.  Neck:     Musculoskeletal: Normal range of motion and neck supple.  Cardiovascular:     Rate and Rhythm: Normal rate.  Pulmonary:     Effort: Pulmonary effort is normal.  Musculoskeletal: Normal range of motion.  Skin:    General: Skin is warm and dry.     Findings: Rash present.     Comments: Hemorrhagic rash noted to approximately the T10/T11 dermatomes of the left flank region and to the left panniculus.   Neurological:     General: No focal deficit present.     Mental Status: She is alert and oriented to person, place, and time.  Psychiatric:        Mood and Affect: Mood normal.        Behavior: Behavior normal.      UC Treatments / Results  Labs (all labs ordered are listed, but only abnormal results are displayed) Labs Reviewed - No data to display  EKG None  Radiology Xr Hip Unilat W Or W/o Pelvis 2-3 Views Left  Result Date: 10/09/2018 Films of the pelvis were obtained  demonstrating degenerative changes in both hips.  The changes are more distinct in the left compared to the right hip.  There is joint space narrowing and small cyst formation in the subchondral space of the femoral head.  Similar findings on the right asymptomatic hip but to a lesser extent.  Diffuse bowel gas obliterate some of the details.  Some degenerative change at L5-S1 based on that one film   Procedures Procedures (including critical care time)  Medications Ordered in UC Medications - No data to display  Initial Impression / Assessment and Plan / UC Course  I have reviewed the triage vital signs and the nursing notes.  Pertinent labs & imaging results that were available during my care of the patient were reviewed by me and considered in my medical decision making (see chart for details).    84 year old female presenting with a 2-day history of a rash to the left flank area and underneath her left abdomen.  Rash is both painful and pruritic.  This rash is highly suspicious for shingles.  Patient is unsure if she has had chickenpox in the past but states that she probably has.  Will place patient on Valtrex 3 times a day for a week.  Over-the-counter analgesics as needed for pain.  Patient advised to watch for signs of fever or worsening of the rash.  Low up with her primary care in 1 week or sooner if needed.  Today's evaluation has revealed no signs of a dangerous process. Discussed diagnosis with patient. Patient aware of their diagnosis, possible red flag symptoms to watch out for and need for close follow up. Patient understands verbal and written discharge instructions. Patient comfortable with plan and disposition.  Patient has a clear mental status at this time, good insight into illness (after discussion and teaching) and has clear judgment to make decisions regarding their care.  Documentation was completed with the aid of voice recognition software. Transcription may contain  typographical errors. Final Clinical Impressions(s) / UC Diagnoses   Final diagnoses:  Herpes zoster without complication     Discharge Instructions     Take medications as provided.  Follow-up with your primary care doctor in 1 week.    ED Prescriptions    Medication Sig Dispense Auth. Provider   valACYclovir (VALTREX) 1000 MG tablet Take 1 tablet (1,000 mg total) by mouth 3 (three) times daily. 21 tablet Enrique Sack, FNP     Controlled Substance Prescriptions [address] Controlled Substance Registry consulted? Not Applicable   Enrique Sack, [place] 10/10/18 1328

## 2018-10-10 NOTE — Discharge Instructions (Signed)
Take medications as provided.  Follow-up with your primary care doctor in 1 week.

## 2018-10-10 NOTE — Telephone Encounter (Signed)
Please call  Patient. Thank you.

## 2018-10-10 NOTE — Telephone Encounter (Signed)
Spoke with patient. I told her that [facility] after they get authorization from insurance.

## 2018-10-10 NOTE — Telephone Encounter (Signed)
Patient called requesting a return call to let her know which office will be calling to schedule her "hip injection."

## 2018-10-11 ENCOUNTER — Telehealth: Payer: Self-pay | Admitting: Pharmacist

## 2018-10-11 ENCOUNTER — Telehealth: Payer: Self-pay | Admitting: *Deleted

## 2018-10-11 NOTE — Telephone Encounter (Signed)
   [facility] Pre-operative Risk Assessment    Request for surgical clearance:  1. What type of surgery is being performed? LEFT HIP INJECTION   2. When is this surgery scheduled? TBD   3. What type of clearance is required (medical clearance vs. Pharmacy clearance to hold med vs. Both)? BOTH  4. Are there any medications that need to be held prior to surgery and how long?WARFARIN HOLD 4 DAYS PRIOR   5. Practice name and name of physician performing surgery? [facility]   6. What is your office phone number [phone]    7.   What is your office fax number [phone]  8.   Anesthesia type (None, local, MAC, general) ? NONE LISTED   Tracey Todd 10/11/2018, 2:43 PM  _________________________________________________________________   (provider comments below)

## 2018-10-11 NOTE — Telephone Encounter (Signed)
Patient with diagnosis of Aflutter on warfarin for anticoagulation.    Procedure: hip injection Date of procedure: TBD  CHADS2-VASc score of  7 (CHF, HTN, AGE, DM2, stroke/tia x 2, CAD, AGE, female)  CrCl 58ml/min Platelet count 300  Per office protocol, patient can hold warfarin for 4 days prior to procedure WITH bridging with Lovenox (enoxaparin) around procedure.

## 2018-10-11 NOTE — Telephone Encounter (Signed)
Patient called stating she was started on valacyclovir 1g. Medication does not have any interaction with warfarin. Ok to take. No additional action needed

## 2018-10-13 NOTE — Telephone Encounter (Signed)
   Primary Cardiologist: Hillis Range, MD  Chart reviewed as part of pre-operative protocol coverage. Patient was contacted 10/13/2018 in reference to pre-operative risk assessment for pending surgery as outlined below.  Tracey Todd was last seen on 09/27/18 by Dr. [name].  Since that day, Tracey Todd has done well. She denies no changes in her medical history with the exception of shingles. She has no new cardiac symptoms and denies anginal symptoms, but is having pain from shingles. Since this diagnosis, her hip injection has been postponed until she has recovered. Her activity level is currently down from her baseline secondary to hip pain and now shingles.  Per our pharmacy,  Patient with diagnosis of Aflutter on warfarin for anticoagulation.    Procedure: hip injection Date of procedure: TBD  CHADS2-VASc score of  7 (CHF, HTN, AGE, DM2, stroke/tia x 2, CAD, AGE, female)  CrCl 71ml/min Platelet count 300  Per office protocol, patient can hold warfarin for 4 days prior to procedure WITH bridging with Lovenox (enoxaparin) around procedure.  Pt is aware that she needs to bridge with lovenox.  Therefore, based on ACC/AHA guidelines, the patient would be at acceptable risk for the planned procedure without further cardiovascular testing.   I will route this recommendation to the requesting party via Epic fax function and remove from pre-op pool.  Please call with questions.  Roe Rutherford Anjel Perfetti, PA 10/13/2018, 8:07 AM

## 2018-10-16 ENCOUNTER — Ambulatory Visit (INDEPENDENT_AMBULATORY_CARE_PROVIDER_SITE_OTHER): Payer: Medicare Other | Admitting: Pharmacist

## 2018-10-16 DIAGNOSIS — I480 Paroxysmal atrial fibrillation: Secondary | ICD-10-CM

## 2018-10-16 DIAGNOSIS — Z7901 Long term (current) use of anticoagulants: Secondary | ICD-10-CM | POA: Diagnosis not present

## 2018-10-16 LAB — POCT INR: INR: 1.9 — AB (ref 2.0–3.0)

## 2018-10-16 NOTE — Progress Notes (Signed)
Patient notified of results & recommendations. Expressed understanding.

## 2018-10-16 NOTE — Patient Instructions (Signed)
Description   Take 1.5 tablets today, then start taking 1 tablet daily except 1.5 tablets on Wednesdays and Saturdays. Recheck in 1 week. Call us at [phone] with any new medications, upcoming procedures, or questions.

## 2018-10-17 ENCOUNTER — Telehealth: Payer: Self-pay | Admitting: Family Medicine

## 2018-10-17 NOTE — Telephone Encounter (Signed)
RN Suzette Battiest called from [facility], she was calling requesting orders for this patient because she is in excruciating pain due to the shingles. Please follow up.

## 2018-10-18 NOTE — Telephone Encounter (Signed)
Called patient and informed her of medication being sent to the pharmacy and scheduled her for 10:50 10/19/2018.

## 2018-10-18 NOTE — Telephone Encounter (Signed)
E-prescribing Gabapentin 300 mg TID as needed for pain.  Please contact patient to advise that I've sent medication. However, I would like to see here for follow-up. I can work her in tomorrow at 10:50 or 4:10. Or I will be working at Urgent Care at [facility] Saturday, she can also come by to see me there. I am concerned that the shingles doesn't seem to be improving from her UC visit 10/10/2018.

## 2018-10-19 ENCOUNTER — Ambulatory Visit (INDEPENDENT_AMBULATORY_CARE_PROVIDER_SITE_OTHER): Payer: Medicare Other | Admitting: Family Medicine

## 2018-10-19 ENCOUNTER — Encounter: Payer: Self-pay | Admitting: Family Medicine

## 2018-10-19 VITALS — BP 118/72 | HR 69 | Temp 97.9°F | Resp 17 | Ht <= 58 in | Wt 176.0 lb

## 2018-10-19 DIAGNOSIS — B028 Zoster with other complications: Secondary | ICD-10-CM | POA: Diagnosis not present

## 2018-10-19 DIAGNOSIS — M792 Neuralgia and neuritis, unspecified: Secondary | ICD-10-CM | POA: Diagnosis not present

## 2018-10-19 MED ORDER — TRIAMCINOLONE ACETONIDE 0.5 % EX CREA: 1.0000 "application " | TOPICAL_CREAM | Freq: Three times a day (TID) | CUTANEOUS | 0 refills | Status: DC

## 2018-10-19 NOTE — Patient Instructions (Signed)
Shingles    Shingles is an infection. It gives you a painful skin rash and blisters that have fluid in them. Shingles is caused by the same germ (virus) that causes chickenpox.  Shingles only happens in people who:   Have had chickenpox.   Have been given a shot of medicine (vaccine) to protect against chickenpox. Shingles is rare in this group.  The first symptoms of shingles may be itching, tingling, or pain in an area on your skin. A rash will show on your skin a few days or weeks later. The rash is likely to be on one side of your body. The rash usually has a shape like a belt or a band. Over time, the rash turns into fluid-filled blisters. The blisters will break open, change into scabs, and dry up. Medicines may:   Help with pain and itching.   Help you get better sooner.   Help to prevent long-term problems.  Follow these instructions at home:  Medicines   Take over-the-counter and prescription medicines only as told by your doctor.   Put on an anti-itch cream or numbing cream where you have a rash, blisters, or scabs. Do this as told by your doctor.  Helping with itching and discomfort     Put cold, wet cloths (cold compresses) on the area of the rash or blisters as told by your doctor.   Cool baths can help you feel better. Try adding baking soda or dry oatmeal to the water to lessen itching. Do not bathe in hot water.  Blister and rash care   Keep your rash covered with a loose bandage (dressing).   Wear loose clothing that does not rub on your rash.   Keep your rash and blisters clean. To do this, wash the area with mild soap and cool water as told by your doctor.   Check your rash every day for signs of infection. Check for:  ? More redness, swelling, or pain.  ? Fluid or blood.  ? Warmth.  ? Pus or a bad smell.   Do not scratch your rash. Do not pick at your blisters. To help you to not scratch:  ? Keep your fingernails clean and cut short.  ? Wear gloves or mittens when you sleep, if  scratching is a problem.  General instructions   Rest as told by your doctor.   Keep all follow-up visits as told by your doctor. This is important.   Wash your hands often with soap and water. If soap and water are not available, use hand sanitizer. Doing this lowers your chance of getting a skin infection caused by germs (bacteria).   Your infection can cause chickenpox in people who have never had chickenpox or never got a shot of chickenpox vaccine. If you have blisters that did not change into scabs yet, try not to touch other people or be around other people, especially:  ? Babies.  ? Pregnant women.  ? Children who have areas of red, itchy, or rough skin (eczema).  ? Very old people who have transplants.  ? People who have a long-term (chronic) sickness, like cancer or AIDS.  Contact a doctor if:   Your pain does not get better with medicine.   Your pain does not get better after the rash heals.   You have any signs of infection in the rash area. These signs include:  ? More redness, swelling, or pain around the rash.  ? Fluid or blood coming from   the rash.  ? The rash area feeling warm to the touch.  ? Pus or a bad smell coming from the rash.  Get help right away if:   The rash is on your face or nose.   You have pain in your face or pain by your eye.   You lose feeling on one side of your face.   You have trouble seeing.   You have ear pain, or you have ringing in your ear.   You have a loss of taste.   Your condition gets worse.  Summary   Shingles gives you a painful skin rash and blisters that have fluid in them.   Shingles is an infection. It is caused by the same germ (virus) that causes chickenpox.   Keep your rash covered with a loose bandage (dressing). Wear loose clothing that does not rub on your rash.   If you have blisters that did not change into scabs yet, try not to touch other people or be around people.  This information is not intended to replace advice given to you by  your health care provider. Make sure you discuss any questions you have with your health care provider.  Document Released: 02/02/2008 Document Revised: 04/20/2017 Document Reviewed: 04/20/2017  Elsevier Interactive Patient Education  2019 [employer] Inc[employer]

## 2018-10-19 NOTE — Progress Notes (Signed)
Established Patient Office Visit  Subjective:  Patient ID: Tracey Todd, female    DOB: 01/02/1935  Age: 84 y.o. MRN: [id number]  CC:  Chief Complaint  Patient presents with  . Follow-up    urgent care 2/11: shingles    HPI [name] presents for evaluation of shingles rash. Diagnosed with shingles at [facility] urgent care on 10/10/18. Treated with anti-viral therapy only. She phoned the office yesterday and complained of shingles related pain. She was prescribed gabapentin for pain and has not picked up medication as of yet. Rash remains visible and mostly painful at night. Denies fever, chills, nausea, or vomiting. She did not receive a shingles vaccine as medication was not covered by her insurance. \ Past Medical History:  Diagnosis Date  . Atrial flutter ([place])    a. mentioned in 2016 admission.  . Chronic diastolic CHF (congestive heart failure) ([place])   . DJD (degenerative joint disease)   . Fatty liver   . 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 HTN (hypertension)   . Hypercholesteremia   . Mild CAD    a. minimal by cath 2016.  . Nodule of chest wall   . Normal coronary arteries and LVF 10/23/14 10/24/2014  . Pacemaker implanted 10/25/14- St Jude 10/26/2014  . Paroxysmal atrial fibrillation (HCC)    a. identified on device interrogation, burden low  . Pulmonary hypertension ([place])   . Sick sinus syndrome (HCC)    a. s/p STJ dual chamber PPM   . Type II or unspecified type diabetes mellitus without mention of complication, not stated as uncontrolled   . Vitamin D deficiency     Past Surgical History:  Procedure Laterality Date  . BREAST SURGERY    . CATARACT EXTRACTION    . LEFT AND RIGHT HEART CATHETERIZATION WITH CORONARY ANGIOGRAM N/A 10/23/2014   Procedure: LEFT AND RIGHT HEART CATHETERIZATION WITH CORONARY ANGIOGRAM;  Surgeon: Blane Ohara, MD;  Location: [facility] CATH LAB;  Service: Cardiovascular;  Laterality: N/A;  . PERMANENT PACEMAKER INSERTION N/A 10/25/2014    STJ dual chamber PPM implanted by Dr Rayann Heman for SSS  . TONSILLECTOMY AND ADENOIDECTOMY    . TOTAL KNEE ARTHROPLASTY      Family History  Problem Relation Age of Onset  . Diabetes Mother   . Cancer Mother   . Cancer Father     Social History   Socioeconomic History  . Marital status: Married    Spouse name: Not on file  . Number of children: Not on file  . Years of education: Not on file  . Highest education level: Not on file  Occupational History  . Not on file  Social Needs  . Financial resource strain: Not on file  . Food insecurity:    Worry: Not on file    Inability: Not on file  . Transportation needs:    Medical: Not on file    Non-medical: Not on file  Tobacco Use  . Smoking status: Former Smoker    Last attempt to quit: 12/06/1963    Years since quitting: 54.9  . Smokeless tobacco: Never Used  Substance and Sexual Activity  . Alcohol use: No  . Drug use: Never  . Sexual activity: Not on file  Lifestyle  . Physical activity:    Days per week: Not on file    Minutes per session: Not on file  . Stress: Not on file  Relationships  . Social connections:    Talks on phone: Not on  file    Gets together: Not on file    Attends religious service: Not on file    Active member of club or organization: Not on file    Attends meetings of clubs or organizations: Not on file    Relationship status: Not on file  . Intimate partner violence:    Fear of current or ex partner: Not on file    Emotionally abused: Not on file    Physically abused: Not on file    Forced sexual activity: Not on file  Other Topics Concern  . Not on file  Social History Narrative  . Not on file    Outpatient Medications Prior to Visit  Medication Sig Dispense Refill  . atorvastatin (LIPITOR) 80 MG tablet Take 1/2 to 1 tablet daily or as directed for Cholesterol (Patient taking differently: Take 1/2 tablet by mouth on Monday and Thursday) 30 tablet 1  . bisoprolol (ZEBETA) 10 MG  tablet Take 1 tablet daily for BP 90 tablet 1  . blood glucose meter kit and supplies KIT Dispense based on patient and insurance preference. Check blood sugar 1 time a day-E11.21 1 each 0  . calcium carbonate (OS-CAL) 600 MG TABS tablet Take 600 mg by mouth daily with breakfast.    . cetirizine (ZYRTEC ALLERGY) 10 MG tablet Take 10 mg by mouth every other day.     . Cholecalciferol (VITAMIN D) 2000 UNITS CAPS Take 6,000 Units by mouth daily.     . furosemide (LASIX) 20 MG tablet Take 1 tablet (20 mg total) by mouth daily. 90 tablet 3  . gabapentin (NEURONTIN) 100 MG capsule TAKE 1 TO 2 CAPSULES BY MOUTH THREE TIMES DAILY FOR  NEUROPATHY  PAINS 540 capsule 3  . glucose blood (FREESTYLE LITE) test strip Check blood sugar 1 time daily-DX-E11.21 100 each 1  . Lancets (FREESTYLE) lancets USE 1 LANCET TO CHECK GLUCOSE ONCE DAILY  1  . Magnesium 500 MG TABS Take 500 mg by mouth 4 (four) times daily.     . metFORMIN (GLUCOPHAGE-XR) 500 MG 24 hr tablet TAKE '1000MG'$  BY MOUTH TWICE DAILY 360 tablet 2  . potassium chloride SA (K-DUR,KLOR-CON) 20 MEQ tablet Take 1 tablet (20 mEq total) by mouth daily. 90 tablet 1  . warfarin (COUMADIN) 5 MG tablet Take 1 tab daily except 1.5 tabs on Saturday or as directed by Coumadin Clinic. 40 tablet 0  . valACYclovir (VALTREX) 1000 MG tablet Take 1 tablet (1,000 mg total) by mouth 3 (three) times daily. 21 tablet 0   No facility-administered medications prior to visit.     Allergies  Allergen Reactions  . Penicillins Anaphylaxis  . Quinapril     Angioedema   . Feldene [Piroxicam] Other (See Comments)    Bleeding   . Calcium-Containing Compounds Other (See Comments)    "calcium deposits on skin"  . Celebrex [Celecoxib] Itching  . Daypro [Oxaprozin] Itching    ROS Review of Systems Pertinent negatives listed in HPI   Objective:    Physical Exam  BP 118/72   Pulse 69   Temp 97.9 F (36.6 C) (Oral)   Resp 17   Ht '4\' 10"'$  (1.473 m)   Wt 176 lb (79.8 kg)    SpO2 94%   BMI 36.78 kg/m    General appearance: alert, well developed, well nourished, cooperative and in no distress Head: Normocephalic, without obvious abnormality, atraumatic Respiratory: Respirations even and unlabored, normal respiratory rate Heart: Regular rate and rhythm. No gallops or murmurs.  Extremities: No gross deformities Skin: Zoster Rash present  Psych: Appropriate mood and affect. Neurologic: Mental status: Alert, oriented to person, place, and time, thought content appropriate. Wt Readings from Last 3 Encounters:  10/19/18 176 lb (79.8 kg)  10/09/18 175 lb (79.4 kg)  10/02/18 177 lb 3.2 oz (80.4 kg)    Lab Results  Component Value Date   TSH 3.900 08/22/2018   Lab Results  Component Value Date   WBC 6.7 08/22/2018   HGB 11.6 08/22/2018   HCT 35.3 08/22/2018   MCV 83 08/22/2018   PLT 300 08/22/2018   Lab Results  Component Value Date   NA 140 08/29/2018   K 3.9 08/29/2018   CO2 24 08/29/2018   GLUCOSE 116 (H) 08/29/2018   BUN 17 08/29/2018   CREATININE 0.78 08/29/2018   BILITOT 0.5 06/02/2018   ALKPHOS 53 03/28/2017   AST 16 06/02/2018   ALT 8 06/02/2018   PROT 6.9 06/02/2018   ALBUMIN 4.6 03/28/2017   CALCIUM 9.4 08/29/2018   ANIONGAP 6 04/26/2015   GFR 84.42 01/02/2015   Lab Results  Component Value Date   CHOL 165 06/02/2018   Lab Results  Component Value Date   HDL 66 06/02/2018   Lab Results  Component Value Date   LDLCALC 86 06/02/2018   Lab Results  Component Value Date   TRIG 57 06/02/2018   Lab Results  Component Value Date   CHOLHDL 2.5 06/02/2018   Lab Results  Component Value Date   HGBA1C 6.7 (H) 10/02/2018      Assessment & Plan:  1. Herpes zoster with complication 2. Neuropathic pain Rash remains present. Antiviral therapy completed.  Patient suffers from type 2 diabetes, therefore will forego prednisone -For pain prescribed gabapentin yesterday, patient will pick-up today -For rash trial triamcinolone  cream 3 times daily as needed   Meds ordered this encounter  Medications  . triamcinolone cream (KENALOG) 0.5 %    Sig: Apply 1 application topically 3 (three) times daily.    Dispense:  90 g    Refill:  0    Follow-up: Return if symptoms worsen or fail to improve.    Molli Barrows, FNP

## 2018-10-25 ENCOUNTER — Ambulatory Visit (INDEPENDENT_AMBULATORY_CARE_PROVIDER_SITE_OTHER): Payer: Medicare Other | Admitting: *Deleted

## 2018-10-25 ENCOUNTER — Ambulatory Visit: Payer: Medicare Other | Admitting: Family Medicine

## 2018-10-25 DIAGNOSIS — Z7901 Long term (current) use of anticoagulants: Secondary | ICD-10-CM

## 2018-10-25 DIAGNOSIS — I480 Paroxysmal atrial fibrillation: Secondary | ICD-10-CM

## 2018-10-25 LAB — POCT INR: INR: 2.1 (ref 2.0–3.0)

## 2018-10-25 NOTE — Patient Instructions (Signed)
Description   Continue taking 1 tablet daily except 1.5 tablets on Wednesdays and Saturdays. Recheck in 1 week. Call us at [phone] with any new medications, upcoming procedures, or questions.     

## 2018-10-31 ENCOUNTER — Ambulatory Visit (INDEPENDENT_AMBULATORY_CARE_PROVIDER_SITE_OTHER): Payer: Medicare Other | Admitting: *Deleted

## 2018-10-31 ENCOUNTER — Ambulatory Visit (INDEPENDENT_AMBULATORY_CARE_PROVIDER_SITE_OTHER): Payer: Medicare Other | Admitting: Orthopaedic Surgery

## 2018-10-31 DIAGNOSIS — I480 Paroxysmal atrial fibrillation: Secondary | ICD-10-CM | POA: Diagnosis not present

## 2018-10-31 DIAGNOSIS — Z7901 Long term (current) use of anticoagulants: Secondary | ICD-10-CM

## 2018-10-31 LAB — POCT INR: INR: 2.1 (ref 2.0–3.0)

## 2018-10-31 NOTE — Patient Instructions (Signed)
Description   Continue taking 1 tablet daily except 1.5 tablets on Wednesdays and Saturdays. Recheck in 1 week. Call us at [phone] with any new medications, upcoming procedures, or questions.

## 2018-11-06 ENCOUNTER — Ambulatory Visit (INDEPENDENT_AMBULATORY_CARE_PROVIDER_SITE_OTHER): Payer: Medicare Other

## 2018-11-06 DIAGNOSIS — Z7901 Long term (current) use of anticoagulants: Secondary | ICD-10-CM | POA: Diagnosis not present

## 2018-11-06 DIAGNOSIS — I480 Paroxysmal atrial fibrillation: Secondary | ICD-10-CM

## 2018-11-06 LAB — POCT INR: INR: 2.1 (ref 2.0–3.0)

## 2018-11-06 NOTE — Patient Instructions (Signed)
Description   Continue taking 1 tablet daily except 1.5 tablets on Wednesdays and Saturdays. Recheck in 2 week. Call us at [phone] with any new medications, upcoming procedures, or questions.

## 2018-11-17 ENCOUNTER — Telehealth: Payer: Self-pay

## 2018-11-17 NOTE — Telephone Encounter (Signed)

## 2018-11-20 ENCOUNTER — Other Ambulatory Visit: Payer: Self-pay | Admitting: Internal Medicine

## 2018-11-20 ENCOUNTER — Other Ambulatory Visit: Payer: Self-pay

## 2018-11-20 ENCOUNTER — Ambulatory Visit (INDEPENDENT_AMBULATORY_CARE_PROVIDER_SITE_OTHER): Payer: Medicare Other | Admitting: Pharmacist

## 2018-11-20 DIAGNOSIS — I480 Paroxysmal atrial fibrillation: Secondary | ICD-10-CM

## 2018-11-20 DIAGNOSIS — Z7901 Long term (current) use of anticoagulants: Secondary | ICD-10-CM

## 2018-11-20 LAB — POCT INR: INR: 1.8 — AB (ref 2.0–3.0)

## 2018-11-28 ENCOUNTER — Other Ambulatory Visit: Payer: Self-pay

## 2018-11-28 ENCOUNTER — Ambulatory Visit (INDEPENDENT_AMBULATORY_CARE_PROVIDER_SITE_OTHER): Payer: Medicare Other | Admitting: *Deleted

## 2018-11-28 DIAGNOSIS — I495 Sick sinus syndrome: Secondary | ICD-10-CM

## 2018-11-28 LAB — CUP PACEART REMOTE DEVICE CHECK
Battery Voltage: 2.99 V
Brady Statistic AP VP Percent: 82 %
Brady Statistic AP VS Percent: 18 %
Brady Statistic AS VP Percent: 1 %
Brady Statistic AS VS Percent: 1 %
Brady Statistic RA Percent Paced: 54 %
Brady Statistic RV Percent Paced: 89 %
Implantable Lead Implant Date: 20160226
Implantable Lead Implant Date: 20160226
Implantable Lead Location: 753859
Implantable Lead Location: 753860
Implantable Lead Model: 1948
Lead Channel Impedance Value: 450 Ohm
Lead Channel Pacing Threshold Amplitude: 0.5 V
Lead Channel Pacing Threshold Amplitude: 1.25 V
Lead Channel Pacing Threshold Pulse Width: 0.4 ms
Lead Channel Pacing Threshold Pulse Width: 0.4 ms
Lead Channel Sensing Intrinsic Amplitude: 2.4 mV
Lead Channel Sensing Intrinsic Amplitude: 8.1 mV
Lead Channel Setting Pacing Amplitude: 1.5 V
Lead Channel Setting Pacing Amplitude: 2.5 V
Lead Channel Setting Pacing Pulse Width: 0.4 ms
Lead Channel Setting Sensing Sensitivity: 2 mV
MDC IDC MSMT BATTERY REMAINING LONGEVITY: 108 mo
MDC IDC MSMT BATTERY REMAINING PERCENTAGE: 95.5 %
MDC IDC MSMT LEADCHNL RA IMPEDANCE VALUE: 330 Ohm
MDC IDC PG IMPLANT DT: 20160226
MDC IDC SESS DTM: 20200331060015
Pulse Gen Model: 2240
Pulse Gen Serial Number: 7700661

## 2018-11-30 ENCOUNTER — Telehealth: Payer: Self-pay

## 2018-11-30 NOTE — Telephone Encounter (Signed)

## 2018-12-04 ENCOUNTER — Other Ambulatory Visit: Payer: Self-pay

## 2018-12-04 ENCOUNTER — Ambulatory Visit (INDEPENDENT_AMBULATORY_CARE_PROVIDER_SITE_OTHER): Payer: Medicare Other | Admitting: *Deleted

## 2018-12-04 DIAGNOSIS — I480 Paroxysmal atrial fibrillation: Secondary | ICD-10-CM | POA: Diagnosis not present

## 2018-12-04 DIAGNOSIS — Z7901 Long term (current) use of anticoagulants: Secondary | ICD-10-CM | POA: Diagnosis not present

## 2018-12-04 LAB — POCT INR: INR: 2 (ref 2.0–3.0)

## 2018-12-04 NOTE — Patient Instructions (Signed)
Description   Continue taking 1 tablet daily except 1.5 tablets on Wednesdays and Saturdays. Recheck in 4 weeks. Call us at [phone] with any new medications, upcoming procedures, or questions.

## 2018-12-05 ENCOUNTER — Encounter: Payer: Self-pay | Admitting: Cardiology

## 2018-12-05 NOTE — Progress Notes (Signed)
Remote pacemaker transmission.   

## 2018-12-07 ENCOUNTER — Telehealth (INDEPENDENT_AMBULATORY_CARE_PROVIDER_SITE_OTHER): Payer: Medicare Other | Admitting: Internal Medicine

## 2018-12-07 ENCOUNTER — Encounter: Payer: Self-pay | Admitting: Internal Medicine

## 2018-12-07 DIAGNOSIS — I4819 Other persistent atrial fibrillation: Secondary | ICD-10-CM

## 2018-12-07 DIAGNOSIS — Z6834 Body mass index (BMI) 34.0-34.9, adult: Secondary | ICD-10-CM | POA: Diagnosis not present

## 2018-12-07 DIAGNOSIS — I110 Hypertensive heart disease with heart failure: Secondary | ICD-10-CM

## 2018-12-07 DIAGNOSIS — I5033 Acute on chronic diastolic (congestive) heart failure: Secondary | ICD-10-CM | POA: Diagnosis not present

## 2018-12-07 MED ORDER — FUROSEMIDE 40 MG PO TABS: 40.0000 mg | ORAL_TABLET | Freq: Every day | ORAL | 3 refills | Status: DC

## 2018-12-07 NOTE — Progress Notes (Signed)
Electrophysiology TeleHealth Note   Due to national recommendations of social distancing due to [place] 19, an audio telehealth visit is felt to be most appropriate for this patient at this time.  Verbal consent was obtained from the patient today.  She does not have a smart phone or Internet and is therefore unable to do a virtual visit with me today.   Date:  12/07/2018   ID:  Tracey Todd, DOB [birth year], MRN [id number]  Location: patient's home  Provider location: [facility]  Evaluation Performed: Follow-up visit  PCP:  Scot Jun, FNP  Cardiologist: Dr Acie Fredrickson Electrophysiologist:  Dr Rayann Heman  Chief Complaint:  swelling  History of Present Illness:    Tracey Todd is a 84 y.o. female who presents via audio  conferencing for a telehealth visit today. She had shingles in February and has subsequently been in persistent afib.  She has had fatigue and edema.  Overall, she is doing well.  She feels that her lasix "is not strong enough".  Today, she denies symptoms of palpitations, chest pain, shortness of breath, dizziness, presyncope, or syncope.  The patient is otherwise without complaint today.  The patient denies symptoms of fevers, chills, cough, or new SOB worrisome for COVID 19.  Past Medical History:  Diagnosis Date  . Atrial flutter ([place])    a. mentioned in 2016 admission.  . Chronic diastolic CHF (congestive heart failure) ([place])   . DJD (degenerative joint disease)   . Fatty liver   . 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 HTN (hypertension)   . Hypercholesteremia   . Mild CAD    a. minimal by cath 2016.  . Nodule of chest wall   . Normal coronary arteries and LVF 10/23/14 10/24/2014  . Pacemaker implanted 10/25/14- St Jude 10/26/2014  . Paroxysmal atrial fibrillation (HCC)    a. identified on device interrogation, burden low  . Pulmonary hypertension ([place])   . Sick sinus syndrome (HCC)    a. s/p STJ dual chamber PPM   . Type II or unspecified type  diabetes mellitus without mention of complication, not stated as uncontrolled   . Vitamin D deficiency     Past Surgical History:  Procedure Laterality Date  . BREAST SURGERY    . CATARACT EXTRACTION    . LEFT AND RIGHT HEART CATHETERIZATION WITH CORONARY ANGIOGRAM N/A 10/23/2014   Procedure: LEFT AND RIGHT HEART CATHETERIZATION WITH CORONARY ANGIOGRAM;  Surgeon: Blane Ohara, MD;  Location: [facility] CATH LAB;  Service: Cardiovascular;  Laterality: N/A;  . PERMANENT PACEMAKER INSERTION N/A 10/25/2014   STJ dual chamber PPM implanted by Dr Rayann Heman for SSS  . TONSILLECTOMY AND ADENOIDECTOMY    . TOTAL KNEE ARTHROPLASTY      Current Outpatient Medications  Medication Sig Dispense Refill  . atorvastatin (LIPITOR) 80 MG tablet Take 1/2 to 1 tablet daily or as directed for Cholesterol (Patient taking differently: Take 1/2 tablet by mouth on Monday and Thursday) 30 tablet 1  . bisoprolol (ZEBETA) 10 MG tablet Take 1 tablet daily for BP 90 tablet 1  . blood glucose meter kit and supplies KIT Dispense based on patient and insurance preference. Check blood sugar 1 time a day-E11.21 1 each 0  . calcium carbonate (OS-CAL) 600 MG TABS tablet Take 600 mg by mouth daily with breakfast.    . cetirizine (ZYRTEC ALLERGY) 10 MG tablet Take 10 mg by mouth every other day.     . Cholecalciferol (VITAMIN D) 2000  UNITS CAPS Take 6,000 Units by mouth daily.     Marland Kitchen gabapentin (NEURONTIN) 100 MG capsule TAKE 1 TO 2 CAPSULES BY MOUTH THREE TIMES DAILY FOR  NEUROPATHY  PAINS 540 capsule 3  . glucose blood (FREESTYLE LITE) test strip Check blood sugar 1 time daily-DX-E11.21 100 each 1  . Lancets (FREESTYLE) lancets USE 1 LANCET TO CHECK GLUCOSE ONCE DAILY  1  . Magnesium 500 MG TABS Take 500 mg by mouth 4 (four) times daily.     . metFORMIN (GLUCOPHAGE-XR) 500 MG 24 hr tablet TAKE 1000MG BY MOUTH TWICE DAILY 360 tablet 2  . potassium chloride SA (K-DUR,KLOR-CON) 20 MEQ tablet Take 1 tablet (20 mEq total) by mouth daily.  90 tablet 1  . triamcinolone cream (KENALOG) 0.5 % Apply 1 application topically 3 (three) times daily. 90 g 0  . warfarin (COUMADIN) 5 MG tablet Take 1 or 1.5 tablets daily or as directed by Coumadin clinic 40 tablet 0  . furosemide (LASIX) 40 MG tablet Take 1 tablet (40 mg total) by mouth daily. 90 tablet 3   No current facility-administered medications for this visit.     Allergies:   Penicillins; Quinapril; Feldene [piroxicam]; Calcium-containing compounds; Celebrex [celecoxib]; and Daypro [oxaprozin]   Social History:  The patient  reports that she quit smoking about 55 years ago. She has never used smokeless tobacco. She reports that she does not drink alcohol or use drugs.   Family History:  The patient's  family history includes Cancer in her father and mother; Diabetes in her mother.   ROS:  Please see the history of present illness.   All other systems are personally reviewed and negative.    Exam:    Vital Signs:  BP (!) 147/78   Pulse 70   Well sounding   Labs/Other Tests and Data Reviewed:    Recent Labs: 06/02/2018: ALT 8 08/21/2018: Magnesium 2.0 08/22/2018: Hemoglobin 11.6; Platelets 300; TSH 3.900 08/29/2018: BUN 17; Creatinine, Ser 0.78; Potassium 3.9; Sodium 140   Wt Readings from Last 3 Encounters:  10/19/18 176 lb (79.8 kg)  10/09/18 175 lb (79.4 kg)  10/02/18 177 lb 3.2 oz (80.4 kg)     Other studies personally reviewed: Additional studies/ records that were reviewed today include: my clinic visit note  Review of the above records today demonstrates: as above   Last device remote is reviewed from [facility] PDF dated 11/28/2018 which reveals normal device function, afib is  Now persistent   ASSESSMENT & PLAN:    1.  Acute on chronic diastolic dysfunction Exacerbated by HTN and AF. Increase lasix to 69m daily Recent labs reviewed 2 gram sodium diet advised Consider rhythm control after COVID 19.  2. HTN bp has been elevated recently Increase  lasix as above  3. Persistent afib Now persistently in afib She does have some CHF with this Continue on coumadin Consider rhythm control after COVID 19 Will need close follow-up in the AF clinic  4. Sick sinus Normal pacemaker function by recent remotes  5. Overweight Lifestyle modification is encouraged  6. Snoring She should reconsider sleep study after COVID 19  7. COVID 19 screen The patient denies symptoms of COVID 19 at this time.  The importance of social distancing was discussed today.  Follow-up:  AF clinic with virtual visit in 2 weeks ,  Will need close follow-up going forward Next remote: 01/2019  Current medicines are reviewed at length with the patient today.   The patient does not have  concerns regarding her medicines.  The following changes were made today:  none  Labs/ tests ordered today include:  No orders of the defined types were placed in this encounter.   Patient Risk:  after full review of this patients clinical status, I feel that they are at moderate risk at this time.  Today, I have spent 22 minutes with the patient with telehealth technology discussing afib management .    Army Fossa, MD  12/07/2018 12:04 PM     [address] [place] [address] [phone] (office) [phone] (fax)

## 2018-12-12 ENCOUNTER — Ambulatory Visit: Payer: Self-pay | Admitting: Internal Medicine

## 2018-12-18 ENCOUNTER — Other Ambulatory Visit: Payer: Self-pay | Admitting: Internal Medicine

## 2018-12-21 ENCOUNTER — Other Ambulatory Visit: Payer: Self-pay | Admitting: Internal Medicine

## 2018-12-22 ENCOUNTER — Other Ambulatory Visit: Payer: Self-pay | Admitting: Internal Medicine

## 2018-12-25 ENCOUNTER — Other Ambulatory Visit: Payer: Self-pay | Admitting: *Deleted

## 2018-12-26 MED ORDER — ATORVASTATIN CALCIUM 80 MG PO TABS: ORAL_TABLET | ORAL | 3 refills | Status: DC

## 2018-12-27 ENCOUNTER — Ambulatory Visit (HOSPITAL_COMMUNITY)
Admission: RE | Admit: 2018-12-27 | Discharge: 2018-12-27 | Disposition: A | Discharge status: Home / Self Care | Payer: Medicare Other | Source: Ambulatory Visit | Attending: Nurse Practitioner | Admitting: Nurse Practitioner

## 2018-12-27 ENCOUNTER — Other Ambulatory Visit: Payer: Self-pay

## 2018-12-27 DIAGNOSIS — I4819 Other persistent atrial fibrillation: Secondary | ICD-10-CM | POA: Diagnosis not present

## 2018-12-27 NOTE — Progress Notes (Addendum)
Electrophysiology TeleHealth Note   Due to national recommendations of social distancing due to [place] 19, Audio/video telehealth visit is felt to be most appropriate for this patient at this time.  See MyChart message/consent below from today for patient consent regarding telehealth for the [facility].    Date:  12/27/2018   ID:  Tracey Todd, DOB [birth year], MRN [id number]  Location: home  Provider location: [address] [place], [place] [address] Evaluation Performed:Follow up  PCP:  Scot Jun, FNP  Primary Cardiologist:   Dr. Stanford Breed Primary Electrophysiologist: Dr. Rayann Heman  DD:[id number] afib    History of Present Illness: Tracey Todd is a 84 y.o. female who presents via audio/video conferencing for a telehealth visit today.   The patient is referred for f/u regarding afib  by Dr Rayann Heman.  Pt was recently seen by Dr. Rayann Heman for persistent afib since Shingles in February. She overall reported that she felt well but reported extra fluid . Her lasix was doubled at that visit and referred to afib clinic for f/u. When covid 19 restrictions allow, Dr. Rayann Heman wants to pursue returning pt to [facility].   On the telephone today, pt sounds well, she has no complaints. Says that she does not notice the afib. Her weight is staying stable with increase of lasix and changing her diet to minimize salt. LLE has diminished.   Today, she denies symptoms of palpitations, chest pain, shortness of breath, orthopnea, PND, chronic lower extremity edema, claudication, dizziness, presyncope, syncope, bleeding, or neurologic sequela. The patient is tolerating medications without difficulties and is otherwise without complaint today.   she denies symptoms of cough, fevers, chills, or new SOB worrisome for COVID 19.   Atrial Fibrillation Risk Factors:  she does not have symptoms or diagnosis of sleep apnea. she does not have a history of rheumatic fever. she does not have a  history of alcohol use. The patient does not have a history of early familial atrial fibrillation or other arrhythmias.  she has a BMI of There is no height or weight on file to calculate BMI.. There were no vitals filed for this visit.  Past Medical History:  Diagnosis Date  . Atrial flutter ([place])    a. mentioned in 2016 admission.  . Chronic diastolic CHF (congestive heart failure) ([place])   . DJD (degenerative joint disease)   . Fatty liver   . 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 HTN (hypertension)   . Hypercholesteremia   . Mild CAD    a. minimal by cath 2016.  . Nodule of chest wall   . Normal coronary arteries and LVF 10/23/14 10/24/2014  . Pacemaker implanted 10/25/14- St Jude 10/26/2014  . Paroxysmal atrial fibrillation (HCC)    a. identified on device interrogation, burden low  . Pulmonary hypertension ([place])   . Sick sinus syndrome (HCC)    a. s/p STJ dual chamber PPM   . Type II or unspecified type diabetes mellitus without mention of complication, not stated as uncontrolled   . Vitamin D deficiency    Past Surgical History:  Procedure Laterality Date  . BREAST SURGERY    . CATARACT EXTRACTION    . LEFT AND RIGHT HEART CATHETERIZATION WITH CORONARY ANGIOGRAM N/A 10/23/2014   Procedure: LEFT AND RIGHT HEART CATHETERIZATION WITH CORONARY ANGIOGRAM;  Surgeon: Blane Ohara, MD;  Location: [facility] CATH LAB;  Service: Cardiovascular;  Laterality: N/A;  . PERMANENT PACEMAKER INSERTION N/A 10/25/2014   STJ dual chamber  PPM implanted by Dr Rayann Heman for SSS  . TONSILLECTOMY AND ADENOIDECTOMY    . TOTAL KNEE ARTHROPLASTY       Current Outpatient Medications  Medication Sig Dispense Refill  . atorvastatin (LIPITOR) 80 MG tablet Take 1/2 tablet by mouth on Monday and Thursday 30 tablet 3  . bisoprolol (ZEBETA) 10 MG tablet Take 1 tablet daily for BP 90 tablet 1  . blood glucose meter kit and supplies KIT Dispense based on patient and insurance preference. Check blood sugar 1 time  a day-E11.21 1 each 0  . calcium carbonate (OS-CAL) 600 MG TABS tablet Take 600 mg by mouth daily with breakfast.    . cetirizine (ZYRTEC ALLERGY) 10 MG tablet Take 10 mg by mouth every other day.     . Cholecalciferol (VITAMIN D) 2000 UNITS CAPS Take 6,000 Units by mouth daily.     . furosemide (LASIX) 40 MG tablet Take 1 tablet (40 mg total) by mouth daily. 90 tablet 3  . gabapentin (NEURONTIN) 100 MG capsule TAKE 1 TO 2 CAPSULES BY MOUTH THREE TIMES DAILY FOR  NEUROPATHY  PAINS 540 capsule 3  . glucose blood (FREESTYLE LITE) test strip Check blood sugar 1 time daily-DX-E11.21 100 each 1  . Lancets (FREESTYLE) lancets USE 1 LANCET TO CHECK GLUCOSE ONCE DAILY  1  . Magnesium 500 MG TABS Take 500 mg by mouth 4 (four) times daily.     . metFORMIN (GLUCOPHAGE-XR) 500 MG 24 hr tablet TAKE '1000MG'$  BY MOUTH TWICE DAILY 360 tablet 2  . potassium chloride SA (K-DUR,KLOR-CON) 20 MEQ tablet Take 1 tablet (20 mEq total) by mouth daily. 90 tablet 1  . triamcinolone cream (KENALOG) 0.5 % Apply 1 application topically 3 (three) times daily. 90 g 0  . warfarin (COUMADIN) 5 MG tablet TAKE 1 TO 1 & 1/2 (ONE & ONE-HALF) TABLETS BY MOUTH ONCE DAILY OR  AS  DIRECTED  BY  COUMADIN  CLINIC 40 tablet 0   No current facility-administered medications for this encounter.     Allergies:   Penicillins; Quinapril; Feldene [piroxicam]; Calcium-containing compounds; Celebrex [celecoxib]; and Daypro [oxaprozin]   Social History:  The patient  reports that she quit smoking about 55 years ago. She has never used smokeless tobacco. She reports that she does not drink alcohol or use drugs.   Family History:  The patient's  family history includes Cancer in her father and mother; Diabetes in her mother.    ROS:  Please see the history of present illness.   All other systems are personally reviewed and negative.   Exam: NA  Recent Labs: 06/02/2018: ALT 8 08/21/2018: Magnesium 2.0 08/22/2018: Hemoglobin 11.6; Platelets 300;  TSH 3.900 08/29/2018: BUN 17; Creatinine, Ser 0.78; Potassium 3.9; Sodium 140  personally reviewed    Other studies personally reviewed: Epic records reviewed      ASSESSMENT AND PLAN:  1. Persisitant atrial fibrillation So far she says she is tolerating, although persistent afib has made her more prone to fluid retention, wihch has been addressed with increase of lasix No change in approach today Continue  warfarin, next drive thru INR check on Monday   2. HF continue low salt diet and 40 mg lasix dailky   This patients CHA2DS2-VASc Score and unadjusted Ischemic Stroke Rate (% per year) is equal to 4.8 % stroke rate/year from a score of 4  Above score calculated as 1 point each if present [CHF, HTN, DM, Vascular=MI/PAD/Aortic Plaque, Age if 65-74, or Female] Above score calculated  as 2 points each if present [Age > 75, or Stroke/TIA/TE]   COVID screen The patient does not have any symptoms that suggest any further testing/ screening at this time.  Social distancing reinforced today.   Follow-up:  2 weeks by phone for increased risk for decompensation Will scheduled in the office when covid restrictions are lifted to pursue restoring SR  Current medicines are reviewed at length with the patient today.   The patient does not have concerns regarding her medicines.  The following changes were made today:  none  Labs/ tests ordered today include: none No orders of the defined types were placed in this encounter.   Patient Risk:  after full review of this patients clinical status, I feel that they are at high risk at this time.   Today, I have spent 12 minutes with the patient with telehealth technology discussing above .    Eduard Roux NP 12/27/2018 9:36 AM  Afib [facility] [place], [place] [address] [phone]   I hereby voluntarily request, consent and authorize the [facility] and its employed or  contracted physicians, physician assistants, nurse practitioners or other licensed health care professionals (the Practitioner), to provide me with telemedicine health care services (the "Services") as deemed necessary by the treating Practitioner. I acknowledge and consent to receive the Services by the Practitioner via telemedicine. I understand that the telemedicine visit will involve communicating with the Practitioner through live audiovisual communication technology and the disclosure of certain medical information by electronic transmission. I acknowledge that I have been given the opportunity to request an in-person assessment or other available alternative prior to the telemedicine visit and am voluntarily participating in the telemedicine visit.   I understand that I have the right to withhold or withdraw my consent to the use of telemedicine in the course of my care at any time, without affecting my right to future care or treatment, and that the Practitioner or I may terminate the telemedicine visit at any time. I understand that I have the right to inspect all information obtained and/or recorded in the course of the telemedicine visit and may receive copies of available information for a reasonable fee.  I understand that some of the potential risks of receiving the Services via telemedicine include:   Delay or interruption in medical evaluation due to technological equipment failure or disruption;  Information transmitted may not be sufficient (e.g. poor resolution of images) to allow for appropriate medical decision making by the Practitioner; and/or  In rare instances, security protocols could fail, causing a breach of personal health information.   Furthermore, I acknowledge that it is my responsibility to provide information about my medical history, conditions and care that is complete and accurate to the best of my ability. I acknowledge that Practitioner's advice, recommendations, and/or  decision may be based on factors not within their control, such as incomplete or inaccurate data provided by me or distortions of diagnostic images or specimens that may result from electronic transmissions. I understand that the practice of medicine is not an exact science and that Practitioner makes no warranties or guarantees regarding treatment outcomes. I acknowledge that I will receive a copy of this consent concurrently upon execution via email to the email address I last provided but may also request a printed copy by calling the office of the [facility].  I understand that my insurance will be billed for this visit.   I have read  or had this consent read to me.  I understand the contents of this consent, which adequately explains the benefits and risks of the Services being provided via telemedicine.  I have been provided ample opportunity to ask questions regarding this consent and the Services and have had my questions answered to my satisfaction.  I give my informed consent for the services to be provided through the use of telemedicine in my medical care  By participating in this telemedicine visit I agree to the above.

## 2018-12-29 ENCOUNTER — Telehealth: Payer: Self-pay

## 2018-12-29 NOTE — Telephone Encounter (Signed)

## 2019-01-01 ENCOUNTER — Ambulatory Visit (INDEPENDENT_AMBULATORY_CARE_PROVIDER_SITE_OTHER): Payer: Medicare Other | Admitting: *Deleted

## 2019-01-01 ENCOUNTER — Other Ambulatory Visit: Payer: Self-pay

## 2019-01-01 ENCOUNTER — Ambulatory Visit (INDEPENDENT_AMBULATORY_CARE_PROVIDER_SITE_OTHER): Payer: Medicare Other | Admitting: Family Medicine

## 2019-01-01 DIAGNOSIS — B028 Zoster with other complications: Secondary | ICD-10-CM

## 2019-01-01 DIAGNOSIS — I509 Heart failure, unspecified: Secondary | ICD-10-CM | POA: Diagnosis not present

## 2019-01-01 DIAGNOSIS — E1159 Type 2 diabetes mellitus with other circulatory complications: Secondary | ICD-10-CM

## 2019-01-01 DIAGNOSIS — I480 Paroxysmal atrial fibrillation: Secondary | ICD-10-CM

## 2019-01-01 DIAGNOSIS — Z7901 Long term (current) use of anticoagulants: Secondary | ICD-10-CM

## 2019-01-01 DIAGNOSIS — I10 Essential (primary) hypertension: Secondary | ICD-10-CM

## 2019-01-01 LAB — POCT INR: INR: 1.6 — AB (ref 2.0–3.0)

## 2019-01-01 NOTE — Progress Notes (Deleted)
Called patient to initiate their telephone visit with provider Joaquin Courts, FNP-C. Verified date of birth. Patient states that her FSBS are running between 98-120. KWalker, CMA.

## 2019-01-01 NOTE — Progress Notes (Signed)
Virtual Visit via Telephone Note  I connected with Tracey Todd on 01/01/19 at  9:30 AM EDT by telephone and verified that I am speaking with the correct person using two identifiers.  Location: Patient: Is at home during today's encounter. Provider: Located in primary care office.   I discussed the limitations, risks, security and privacy concerns of performing an evaluation and management service by telephone and the availability of in person appointments. I also discussed with the patient that there may be a patient responsible charge related to this service. The patient expressed understanding and agreed to proceed.   History of Present Illness: Tracey Todd's medical history is significant for chronic atrial fib, CHF, hypertension, type 2 diabetes, hx of shingles, DJD hips, chronic anticoagulation, and obesity.  Hypertension/CHF/Afib [facility] monitors her blood pressure. Reports readings have ranged systolic 130's-140's with some readings as high as 150's. Diastolic readings have remained less than 90. Prior to establishing care here, prior provider discontinued her previous BP medication and she was started in bisoprolol for hypertension and rate management. Concern as BP readings occasionally fluctuate. She endorses sodium intake which she is trying to reduce consumption. Endorses leg swelling as the day progresses which resolves with rest. Denies chest pain, SOB, new weakness, dizziness, or headache. She monitors her pulse which ranges between 60-70. Followed by the Coumadin clinic. Recent INR subtherapeutic 1.6.  Endorses compliances with medication. She sedentary due to COVID-19 and DJD of hip. She is followed by [facility] for hip problem and plans to obtain a hip injection in the near future and hopes to be able to increase physical activity.   Diabetes  Blood sugars have remained stable. Fasting readings 90-120. No hypoglycemia. Last A1C 6.7. Current regimen is metformin only. She is  prescribed statin therapy. Denies neuropathy. Recently recovered from a shingles outbreak which has resolved. Occasionally experiences mild burning sensation at the site of rash without any rash or blister present. Pain lasts only a few seconds and is not worrisome. She is unable to afford shingles vaccine and reports that her insurance will not cover the vaccine.   Assessment and Plan: 1. Congestive heart failure, unspecified HF chronicity, unspecified heart failure type (HCC), stable.  -Continue to reduce sodium  -Continue to limit fluid intake to assist with management of BLE edema.  2. Herpes zoster with complication, resolved -If neuropathic pain persists, worsens, with or without a rash, please follow-up as you will likely require treatment with anti-viral medication.  3. Type 2 diabetes mellitus with other circulatory complication, unspecified whether long term insulin use (HCC),  A1C 6.7 prior 3 months. Home readings stable. No changes today with medication.  4. Essential hypertension, stable Advised if BP is consistently greater than 140/90, will need to make adjustments to medication. Bring BP cuff to follow-up and home readings to next visit. Encouraged to make efforts to reduce intake of high dose sodium.   5. Long-term current use anticoagulant therapy -Ensure follow-up with coumadin clinic and take revised dose of coumadin as prescribed -Do not increase intake of green leafy vegetables =.  Follow Up Instructions: 8 week follow-up hypertension and DM follow-up with labs   I discussed the assessment and treatment plan with the patient. The patient was provided an opportunity to ask questions and all were answered. The patient agreed with the plan and demonstrated an understanding of the instructions.   The patient was advised to call back or seek an in-person evaluation if the symptoms worsen or if the condition  fails to improve as anticipated.  I provided 20 minutes of  non-face-to-face time during this encounter.   Joaquin CourtsKimberly Zelpha Messing, FNP

## 2019-01-01 NOTE — Patient Instructions (Signed)
Spoke with pt and instructed pt to take 1.5 tablets today, then increase dose to 1 tablet daily except 1.5 tablets on Mondays, Wednesdays and Saturdays. Recheck in 2 weeks. Call us at [phone] with any new medications, upcoming procedures, or questions.

## 2019-01-03 ENCOUNTER — Encounter: Payer: Self-pay | Admitting: Podiatry

## 2019-01-03 ENCOUNTER — Ambulatory Visit (INDEPENDENT_AMBULATORY_CARE_PROVIDER_SITE_OTHER): Payer: Medicare Other | Admitting: Podiatry

## 2019-01-03 ENCOUNTER — Other Ambulatory Visit: Payer: Self-pay

## 2019-01-03 VITALS — Temp 97.3°F

## 2019-01-03 DIAGNOSIS — B351 Tinea unguium: Secondary | ICD-10-CM | POA: Diagnosis not present

## 2019-01-03 DIAGNOSIS — E1121 Type 2 diabetes mellitus with diabetic nephropathy: Secondary | ICD-10-CM | POA: Diagnosis not present

## 2019-01-03 DIAGNOSIS — M79676 Pain in unspecified toe(s): Secondary | ICD-10-CM

## 2019-01-03 DIAGNOSIS — D689 Coagulation defect, unspecified: Secondary | ICD-10-CM

## 2019-01-03 NOTE — Progress Notes (Signed)
Patient ID: Kery C Shad, female   DOB: 03/27/1935, 83 y.o.   MRN: [id number] Complaint:  Visit Type: Patient returns to my office for continued preventative foot care services. Complaint: Patient states" my nails have grown long and thick and become painful to walk and wear shoes" Patient has been diagnosed with DM with no foot complications... The patient presents for preventative foot care services. No changes to ROS.  Patient is now taking coumadin.  Podiatric Exam: Vascular: dorsalis pedis and posterior tibial pulses are palpable bilateral. Capillary return is immediate. Temperature gradient is WNL. Skin turgor WNL  Sensorium: Diminished  Semmes Weinstein monofilament test. Normal tactile sensation bilaterally. Nail Exam: Pt has thick disfigured discolored nails with subungual debris noted bilateral entire nail hallux through fifth toenails Ulcer Exam: There is no evidence of ulcer or pre-ulcerative changes or infection. Orthopedic Exam: Muscle tone and strength are WNL. No limitations in general ROM. No crepitus or effusions noted. Foot type and digits show no abnormalities. Mild dorsomedial exostosis noted. Skin: No Porokeratosis. No infection or ulcers  Diagnosis:  Onychomycosis, , Pain in right toe, pain in left toes  Treatment & Plan Procedures and Treatment: Consent by patient was obtained for treatment procedures. The patient understood the discussion of treatment and procedures well. All questions were answered thoroughly reviewed. Debridement of mycotic and hypertrophic toenails, 1 through 5 bilateral and clearing of subungual debris. No ulceration, no infection noted. Minimal mechanical debridement requested.  . Return Visit-Office Procedure: Patient instructed to return to the office for a follow up visit 3 months for continued evaluation and treatment.    Karlin Binion DPM 

## 2019-01-08 ENCOUNTER — Telehealth: Payer: Self-pay | Admitting: Pharmacist

## 2019-01-08 NOTE — Telephone Encounter (Signed)
Patient called to say her hip injection is scheduled for 5/21. She has been cleared to hold 4 days prior w/ lovenox injections. Scheduled patient to come to office on Friday 5/15 to review lovenox injections/instructions. Patient is also scheduled on 5/20 per request as states she needs and INR reading the day prior to injection

## 2019-01-09 ENCOUNTER — Ambulatory Visit (HOSPITAL_COMMUNITY)
Admission: RE | Admit: 2019-01-09 | Discharge: 2019-01-09 | Disposition: A | Discharge status: Home / Self Care | Payer: Medicare Other | Source: Ambulatory Visit | Attending: Nurse Practitioner | Admitting: Nurse Practitioner

## 2019-01-09 DIAGNOSIS — I4819 Other persistent atrial fibrillation: Secondary | ICD-10-CM

## 2019-01-09 NOTE — Progress Notes (Signed)
Electrophysiology TeleHealth Note   Due to national recommendations of social distancing due to [place] 19, Audio/video telehealth visit is felt to be most appropriate for this patient at this time.  See MyChart message/consent below from today for patient consent regarding telehealth for the [facility].    Date:  01/09/2019   ID:  Tracey Todd, DOB 1935-06-06, MRN [id number]  Location: home  Provider location: [address] [place], [place] [address] Evaluation Performed:Follow up  PCP:  Scot Jun, FNP  Primary Cardiologist:   Dr. Stanford Breed Primary Electrophysiologist: Dr. Rayann Heman  XA:[facility] afib    History of Present Illness: Tracey Todd is a 84 y.o. female who presents via audio/video conferencing for a telehealth visit today.   The patient is referred for f/u regarding afib  by Dr Rayann Heman.  Pt was recently seen by Dr. Rayann Heman for persistent afib since Shingles in February. She overall reported that she felt well but reported extra fluid . Her lasix was doubled at that visit and referred to afib clinic for f/u. When covid 19 restrictions allow, Dr. Rayann Heman wants to pursue returning pt to [place].   On the telephone today, pt sounds well, she has no complaints. Says that she does not notice the afib. Her weight is staying stable with increase of lasix and changing her diet to minimize salt. LLE has diminished.  F/u afib clinic 5/12. She reports that she is doing well. She does not notice any afib. Does not notice any extra fluid. She is pending a hip injection 5/21 and will be on lovenox prior to the injection. This is being arranged thru [facility] coumadin clinic.Her BP this am is 130/60 with a  HR OF 62 bpm.   Today, she denies symptoms of palpitations, chest pain, shortness of breath, orthopnea, PND, chronic lower extremity edema, claudication, dizziness, presyncope, syncope, bleeding, or neurologic sequela. The patient is tolerating medications  without difficulties and is otherwise without complaint today.   she denies symptoms of cough, fevers, chills, or new SOB worrisome for COVID 19.   Atrial Fibrillation Risk Factors:  she does not have symptoms or diagnosis of sleep apnea. she does not have a history of rheumatic fever. she does not have a history of alcohol use. The patient does not have a history of early familial atrial fibrillation or other arrhythmias.  she has a BMI of There is no height or weight on file to calculate BMI.. There were no vitals filed for this visit.  Past Medical History:  Diagnosis Date   Atrial flutter ([place])    a. mentioned in 2016 admission.   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 minimal by cath 2016.   Nodule of chest wall    Normal coronary arteries and LVF 10/23/14 10/24/2014   Pacemaker implanted 10/25/14- [employer] Jude 10/26/2014   Paroxysmal atrial fibrillation (HCC)    a. identified on device interrogation, burden low   Pulmonary hypertension (HCC)    Sick sinus syndrome ([place])    a. s/p STJ dual chamber PPM    Type II or unspecified type diabetes mellitus without mention of complication, not stated as uncontrolled    Vitamin D deficiency    Past Surgical History:  Procedure Laterality Date   BREAST  SURGERY     CATARACT EXTRACTION     LEFT AND RIGHT HEART CATHETERIZATION WITH CORONARY ANGIOGRAM N/A 10/23/2014   Procedure: LEFT AND RIGHT HEART CATHETERIZATION WITH CORONARY ANGIOGRAM;  Surgeon: Blane Ohara, MD;  Location: [facility] CATH LAB;  Service: Cardiovascular;  Laterality: N/A;   PERMANENT PACEMAKER INSERTION N/A 10/25/2014   STJ dual chamber PPM implanted by Dr Rayann Heman for SSS   TONSILLECTOMY AND ADENOIDECTOMY     TOTAL KNEE ARTHROPLASTY       Current Outpatient Medications  Medication Sig  Dispense Refill   atorvastatin (LIPITOR) 80 MG tablet Take 1/2 tablet by mouth on Monday and Thursday 30 tablet 3   bisoprolol (ZEBETA) 10 MG tablet Take 1 tablet daily for BP 90 tablet 1   blood glucose meter kit and supplies KIT Dispense based on patient and insurance preference. Check blood sugar 1 time a day-E11.21 1 each 0   calcium carbonate (OS-CAL) 600 MG TABS tablet Take 600 mg by mouth daily with breakfast.     cetirizine (ZYRTEC ALLERGY) 10 MG tablet Take 10 mg by mouth every other day.      Cholecalciferol (VITAMIN D) 2000 UNITS CAPS Take 6,000 Units by mouth daily.      furosemide (LASIX) 40 MG tablet Take 1 tablet (40 mg total) by mouth daily. 90 tablet 3   gabapentin (NEURONTIN) 100 MG capsule TAKE 1 TO 2 CAPSULES BY MOUTH THREE TIMES DAILY FOR  NEUROPATHY  PAINS 540 capsule 3   glucose blood (FREESTYLE LITE) test strip Check blood sugar 1 time daily-DX-E11.21 100 each 1   Lancets (FREESTYLE) lancets USE 1 LANCET TO CHECK GLUCOSE ONCE DAILY  1   Magnesium 500 MG TABS Take 500 mg by mouth 4 (four) times daily.      metFORMIN (GLUCOPHAGE-XR) 500 MG 24 hr tablet TAKE '1000MG'$  BY MOUTH TWICE DAILY 360 tablet 2   potassium chloride SA (K-DUR,KLOR-CON) 20 MEQ tablet Take 1 tablet (20 mEq total) by mouth daily. 90 tablet 1   warfarin (COUMADIN) 5 MG tablet TAKE 1 TO 1 & 1/2 (ONE & ONE-HALF) TABLETS BY MOUTH ONCE DAILY OR  AS  DIRECTED  BY  COUMADIN  CLINIC 40 tablet 0   No current facility-administered medications for this encounter.     Allergies:   Penicillins; Quinapril; Feldene [piroxicam]; Calcium-containing compounds; Celebrex [celecoxib]; and Daypro [oxaprozin]   Social History:  The patient  reports that she quit smoking about 55 years ago. She has never used smokeless tobacco. She reports that she does not drink alcohol or use drugs.   Family History:  The patient's  family history includes Cancer in her father and mother; Diabetes in her mother.    ROS:  Please  see the history of present illness.   All other systems are personally reviewed and negative.   Exam: NA  Recent Labs: 06/02/2018: ALT 8 08/21/2018: Magnesium 2.0 08/22/2018: Hemoglobin 11.6; Platelets 300; TSH 3.900 08/29/2018: BUN 17; Creatinine, Ser 0.78; Potassium 3.9; Sodium 140  personally reviewed    Other studies personally reviewed: Epic records reviewed      ASSESSMENT AND PLAN:  1. Persisitant atrial fibrillation States this is not bothering her Denies any fluid overload with this   Had an increase of  Lasix several weeks ago and is tolerating No change in approach today Continue  Warfarin other than 4 days before 5/21 when she sill use short term lovenox for pending hip injection   2. HF stable continue low salt diet  and 40 mg lasix daily   This patients CHA2DS2-VASc Score and unadjusted Ischemic Stroke Rate (% per year) is equal to 4.8 % stroke rate/year from a score of 4  Above score calculated as 1 point each if present [CHF, HTN, DM, Vascular=MI/PAD/Aortic Plaque, Age if 65-74, or Female] Above score calculated as 2 points each if present [Age > 75, or Stroke/TIA/TE]   COVID screen The patient does not have any symptoms that suggest any further testing/ screening at this time.  Social distancing reinforced today.   Follow-up: with PCP 6/16 remtoe PPm check scheduled for 6/30 Will see in afib office tentatively first week of July to further assess restoring SR Current medicines are reviewed at length with the patient today.   The patient does not have concerns regarding her medicines.  The following changes were made today:  none  Labs/ tests ordered today include: none No orders of the defined types were placed in this encounter.   Patient Risk:  after full review of this patients clinical status, I feel that they are at high risk at this time.   Today, I have spent 76mnutes with the patient with telehealth technology discussing above .     SEduard RouxNP 01/09/2019 8:42 AM  [facility] [place] [phone]  I hereby voluntarily request, consent and authorize the [facility] its employed or contracted physicians, physician assistants, nurse practitioners or other licensed health care professionals (the Practitioner), to provide me with telemedicine health care services (the "Services") as deemed necessary by the treating Practitioner. I acknowledge and consent to receive the Services by the Practitioner via telemedicine. I understand that the telemedicine visit will involve communicating with the Practitioner through live audiovisual communication technology and the disclosure of certain medical information by electronic transmission. I acknowledge that I have been given the opportunity to request an in-person assessment or other available alternative prior to the telemedicine visit and am voluntarily participating in the telemedicine visit.   I understand that I have the right to withhold or withdraw my consent to the use of telemedicine in the course of my care at any time, without affecting my right to future care or treatment, and that the Practitioner or I may terminate the telemedicine visit at any time. I understand that I have the right to inspect all information obtained and/or recorded in the course of the telemedicine visit and may receive copies of available information for a reasonable fee.  I understand that some of the potential risks of receiving the Services via telemedicine include:   Delay or interruption in medical evaluation due to technological equipment failure or disruption;  Information transmitted may not be sufficient (e.g. poor resolution of images) to allow for appropriate medical decision making by the Practitioner; and/or  In rare instances, security protocols could fail, causing a breach of personal health  information.   Furthermore, I acknowledge that it is my responsibility to provide information about my medical history, conditions and care that is complete and accurate to the best of my ability. I acknowledge that Practitioner's advice, recommendations, and/or decision may be based on factors not within their control, such as incomplete or inaccurate data provided by me or distortions of diagnostic images or specimens that may result from electronic transmissions. I understand that the practice of medicine is not an exact science and that Practitioner makes no warranties or guarantees regarding treatment outcomes. I acknowledge that I  will receive a copy of this consent concurrently upon execution via email to the email address I last provided but may also request a printed copy by calling the office of the [facility].  I understand that my insurance will be billed for this visit.   I have read or had this consent read to me.  I understand the contents of this consent, which adequately explains the benefits and risks of the Services being provided via telemedicine.  I have been provided ample opportunity to ask questions regarding this consent and the Services and have had my questions answered to my satisfaction.  I give my informed consent for the services to be provided through the use of telemedicine in my medical care  By participating in this telemedicine visit I agree to the above.

## 2019-01-11 ENCOUNTER — Telehealth: Payer: Self-pay

## 2019-01-11 NOTE — Telephone Encounter (Signed)

## 2019-01-12 ENCOUNTER — Ambulatory Visit (INDEPENDENT_AMBULATORY_CARE_PROVIDER_SITE_OTHER): Payer: Medicare Other | Admitting: Pharmacist

## 2019-01-12 ENCOUNTER — Other Ambulatory Visit: Payer: Self-pay

## 2019-01-12 DIAGNOSIS — I480 Paroxysmal atrial fibrillation: Secondary | ICD-10-CM

## 2019-01-12 DIAGNOSIS — Z7901 Long term (current) use of anticoagulants: Secondary | ICD-10-CM

## 2019-01-12 LAB — POCT INR: INR: 1.8 — AB (ref 2.0–3.0)

## 2019-01-12 MED ORDER — ENOXAPARIN SODIUM 120 MG/0.8ML ~~LOC~~ SOLN: 120.0000 mg | SUBCUTANEOUS | 1 refills | Status: DC

## 2019-01-12 NOTE — Patient Instructions (Addendum)
5/15: Inject Lovenox 120 mg in the fatty abdominal tissue at least 2 inches from the belly button once daily at 8PM No Coumadin.  5/16: Inject Lovenox 120 mg in the fatty abdominal tissue at least 2 inches from the belly button once daily at 8PM No Coumadin.  5/17: Inject Lovenox 120 mg in the fatty abdominal tissue at least 2 inches from the belly button once daily at 8PM No Coumadin.  5/18: Inject Lovenox 120 mg in the fatty abdominal tissue at least 2 inches from the belly button once daily at 8PM No Coumadin.  5/19: Inject Lovenox 120 mg in the fatty abdominal tissue at least 2 inches from the belly button once daily at 8PM No Coumadin.  5/20: No Lovenox, No Coumadin  5/21: Procedure Day - No Lovenox - Resume Coumadin in the evening or as directed by doctor (take an extra half tablet with usual dose for 2 days then resume normal dose).  5/22: Resume Lovenox inject in the fatty tissue every daily at 8 AM and take Coumadin.  5/23:  Resume Lovenox inject in the fatty tissue every daily at 8 AM and take Coumadin..  5/24:  Resume Lovenox inject in the fatty tissue every daily at 8 AM and take Coumadin.  5/25:  Resume Lovenox inject in the fatty tissue every daily at 8 AM and take Coumadin.  5/26:  Resume Lovenox inject in the fatty tissue every daily at 8 AM and take Coumadin..  5/27: Coumadin appt to check INR.

## 2019-01-16 ENCOUNTER — Telehealth: Payer: Self-pay

## 2019-01-16 NOTE — Telephone Encounter (Signed)

## 2019-01-16 NOTE — Telephone Encounter (Signed)
lmom for prescreen  

## 2019-01-17 ENCOUNTER — Ambulatory Visit (INDEPENDENT_AMBULATORY_CARE_PROVIDER_SITE_OTHER): Payer: Medicare Other | Admitting: *Deleted

## 2019-01-17 ENCOUNTER — Other Ambulatory Visit: Payer: Self-pay

## 2019-01-17 DIAGNOSIS — I480 Paroxysmal atrial fibrillation: Secondary | ICD-10-CM

## 2019-01-17 DIAGNOSIS — Z7901 Long term (current) use of anticoagulants: Secondary | ICD-10-CM

## 2019-01-17 LAB — POCT INR: INR: 1 — AB (ref 2.0–3.0)

## 2019-01-17 NOTE — Patient Instructions (Addendum)
Description   Spoke and instructed pt to follow her  Lovenox bridge instructions

## 2019-01-18 ENCOUNTER — Ambulatory Visit
Admission: RE | Admit: 2019-01-18 | Discharge: 2019-01-18 | Disposition: A | Discharge status: Home / Self Care | Payer: Medicare Other | Source: Ambulatory Visit | Attending: Orthopaedic Surgery | Admitting: Orthopaedic Surgery

## 2019-01-18 ENCOUNTER — Other Ambulatory Visit: Payer: Self-pay

## 2019-01-18 DIAGNOSIS — M1612 Unilateral primary osteoarthritis, left hip: Secondary | ICD-10-CM | POA: Diagnosis not present

## 2019-01-18 DIAGNOSIS — M25552 Pain in left hip: Secondary | ICD-10-CM

## 2019-01-18 MED ORDER — METHYLPREDNISOLONE ACETATE 40 MG/ML INJ SUSP (RADIOLOG
120.0000 mg | Freq: Once | INTRAMUSCULAR | Status: AC
  Administered 2019-01-18: 120 mg via INTRA_ARTICULAR

## 2019-01-18 MED ORDER — IOPAMIDOL (ISOVUE-M 200) INJECTION 41%
1.0000 mL | Freq: Once | INTRAMUSCULAR | Status: AC
  Administered 2019-01-18: 1 mL via INTRA_ARTICULAR

## 2019-01-18 NOTE — Discharge Instructions (Signed)

## 2019-01-19 ENCOUNTER — Other Ambulatory Visit: Payer: Self-pay | Admitting: Internal Medicine

## 2019-01-19 ENCOUNTER — Telehealth: Payer: Self-pay | Admitting: *Deleted

## 2019-01-19 NOTE — Telephone Encounter (Signed)

## 2019-01-24 ENCOUNTER — Other Ambulatory Visit: Payer: Self-pay

## 2019-01-24 ENCOUNTER — Ambulatory Visit (INDEPENDENT_AMBULATORY_CARE_PROVIDER_SITE_OTHER): Payer: Medicare Other | Admitting: *Deleted

## 2019-01-24 DIAGNOSIS — I480 Paroxysmal atrial fibrillation: Secondary | ICD-10-CM | POA: Diagnosis not present

## 2019-01-24 DIAGNOSIS — Z7901 Long term (current) use of anticoagulants: Secondary | ICD-10-CM | POA: Diagnosis not present

## 2019-01-24 LAB — POCT INR: INR: 1.3 — AB (ref 2.0–3.0)

## 2019-01-24 NOTE — Patient Instructions (Addendum)
Description   Spoke and instructed pt to take 2 tablets today and 2 tablets tomorrow and resume Lovenox 120mg  injections daily then pt will resume normal dose of 1 tablet daily except 1.5 tablets Mondays, Wednesdays, and Saturdays. Recheck INR on Monday. Coumadin Clinic [phone]

## 2019-01-25 ENCOUNTER — Telehealth: Payer: Self-pay | Admitting: Pharmacist

## 2019-01-25 NOTE — Telephone Encounter (Signed)

## 2019-01-29 ENCOUNTER — Other Ambulatory Visit: Payer: Self-pay

## 2019-01-29 ENCOUNTER — Ambulatory Visit (INDEPENDENT_AMBULATORY_CARE_PROVIDER_SITE_OTHER): Payer: Medicare Other | Admitting: *Deleted

## 2019-01-29 DIAGNOSIS — Z7901 Long term (current) use of anticoagulants: Secondary | ICD-10-CM | POA: Diagnosis not present

## 2019-01-29 DIAGNOSIS — I480 Paroxysmal atrial fibrillation: Secondary | ICD-10-CM

## 2019-01-29 LAB — POCT INR: INR: 2 (ref 2.0–3.0)

## 2019-01-29 NOTE — Patient Instructions (Addendum)
Description   Take 2 tablets today and then continue to take normal dose of 1 tablet daily except 1.5 tablets Mondays, Wednesdays, and Saturdays. Recheck INR on 6/11. Coumadin Clinic [phone]

## 2019-01-31 ENCOUNTER — Telehealth: Payer: Self-pay | Admitting: Internal Medicine

## 2019-01-31 NOTE — Telephone Encounter (Signed)
New Message:    Please call, Pt have been taking Lovenox shots. She says she is bruising a lot and itching so much. She says the itching will not stop.

## 2019-01-31 NOTE — Telephone Encounter (Signed)
Spoke with patient who states that she took her last dose of lovenox several days ago and is still itching on stomach and getting hot and sweaty. She has not tried anything for the itching. She states she does not have a rash, just bruising.   She also has not been short of breath while on medication. Advise that she take her zytrec as needed to help with itching and use a topical cortizone cream. Also advised that if related to the medication, she should experience continued improvement over the coming days and if not should call primary care to be evaluated. She already has an upcoming appt with them. She will call with any other concerns.

## 2019-02-01 ENCOUNTER — Telehealth: Payer: Self-pay

## 2019-02-01 NOTE — Telephone Encounter (Signed)

## 2019-02-08 ENCOUNTER — Ambulatory Visit: Payer: Medicare Other | Admitting: *Deleted

## 2019-02-08 ENCOUNTER — Other Ambulatory Visit: Payer: Self-pay

## 2019-02-08 DIAGNOSIS — Z7901 Long term (current) use of anticoagulants: Secondary | ICD-10-CM | POA: Diagnosis not present

## 2019-02-08 DIAGNOSIS — I480 Paroxysmal atrial fibrillation: Secondary | ICD-10-CM | POA: Diagnosis not present

## 2019-02-08 LAB — POCT INR: INR: 1.9 — AB (ref 2.0–3.0)

## 2019-02-08 NOTE — Patient Instructions (Signed)
Description   Change dose to 1.5 tablets daily, except 1 tablet on Monday, Wednesday and Friday. Recheck INR 1 week. Coumadin Clinic [phone]

## 2019-02-12 ENCOUNTER — Telehealth: Payer: Self-pay

## 2019-02-12 NOTE — Telephone Encounter (Signed)
Called patient to do their pre-visit COVID screening.  Have you recently traveled internationally([place], [place], [place], [place], [place]) or within the [place] to a hotspot area([place], [place], [place], [place], [place])? no  Are you currently experiencing any of the following: fever, cough, SHOB, fatigue, body aches, loss of smell, rash, diarrhea, vomiting, severe headaches, weakness, sore throat? no  Have you been in contact with anyone who has recently travelled? no  Have you been in contact with anyone who is experiencing any of the above symptoms or been diagnosed with COVID  or works in or has recently visited a SNF? no  

## 2019-02-13 ENCOUNTER — Telehealth: Payer: Self-pay

## 2019-02-13 ENCOUNTER — Other Ambulatory Visit: Payer: Self-pay

## 2019-02-13 ENCOUNTER — Encounter: Payer: Self-pay | Admitting: Family Medicine

## 2019-02-13 ENCOUNTER — Ambulatory Visit (INDEPENDENT_AMBULATORY_CARE_PROVIDER_SITE_OTHER): Payer: Medicare Other | Admitting: Family Medicine

## 2019-02-13 VITALS — BP 161/93 | HR 84 | Temp 98.2°F | Resp 17 | Ht <= 58 in | Wt 181.4 lb

## 2019-02-13 DIAGNOSIS — L309 Dermatitis, unspecified: Secondary | ICD-10-CM | POA: Diagnosis not present

## 2019-02-13 DIAGNOSIS — I10 Essential (primary) hypertension: Secondary | ICD-10-CM

## 2019-02-13 DIAGNOSIS — E1142 Type 2 diabetes mellitus with diabetic polyneuropathy: Secondary | ICD-10-CM

## 2019-02-13 DIAGNOSIS — E785 Hyperlipidemia, unspecified: Secondary | ICD-10-CM

## 2019-02-13 DIAGNOSIS — M7989 Other specified soft tissue disorders: Secondary | ICD-10-CM

## 2019-02-13 MED ORDER — TRIAMCINOLONE ACETONIDE 0.025 % EX OINT: 1.0000 "application " | TOPICAL_OINTMENT | Freq: Three times a day (TID) | CUTANEOUS | 1 refills | Status: DC | PRN

## 2019-02-13 MED ORDER — AMLODIPINE BESYLATE 5 MG PO TABS: 5.0000 mg | ORAL_TABLET | Freq: Every day | ORAL | 3 refills | Status: DC

## 2019-02-13 MED ORDER — GABAPENTIN 100 MG PO CAPS: ORAL_CAPSULE | ORAL | 3 refills | Status: DC

## 2019-02-13 NOTE — Patient Instructions (Addendum)
Follow-up cardiology regarding blood pressure. I will have my assistant follow-up with you 02/26/19 to check your home blood pressure readings. I have added amlodipine 5 mg once at bedtime.  No changes to other medications. I have also prescribed Triamcinolone ointment for you to apply to your abdomen 3 times daily as needed.      How to Take Your Blood Pressure You can take your blood pressure at home with a machine. You may need to check your blood pressure at home:  To check if you have high blood pressure (hypertension).  To check your blood pressure over time.  To make sure your blood pressure medicine is working. Supplies needed: You will need a blood pressure machine, or monitor. You can buy one at a drugstore or online. When choosing one:  Choose one with an arm cuff.  Choose one that wraps around your upper arm. Only one finger should fit between your arm and the cuff.  Do not choose one that measures your blood pressure from your wrist or finger. Your doctor can suggest a monitor. How to prepare Avoid these things for 30 minutes before checking your blood pressure:  Drinking caffeine.  Drinking alcohol.  Eating.  Smoking.  Exercising. Five minutes before checking your blood pressure:  Pee.  Sit in a dining chair. Avoid sitting in a soft couch or armchair.  Be quiet. Do not talk. How to take your blood pressure Follow the instructions that came with your machine. If you have a digital blood pressure monitor, these may be the instructions: 1. Sit up straight. 2. Place your feet on the floor. Do not cross your ankles or legs. 3. Rest your left arm at the level of your heart. You may rest it on a table, desk, or chair. 4. Pull up your shirt sleeve. 5. Wrap the blood pressure cuff around the upper part of your left arm. The cuff should be 1 inch (2.5 cm) above your elbow. It is best to wrap the cuff around bare skin. 6. Fit the cuff snugly around your arm. You  should be able to place only one finger between the cuff and your arm. 7. Put the cord inside the groove of your elbow. 8. Press the power button. 9. Sit quietly while the cuff fills with air and loses air. 10. Write down the numbers on the screen. 11. Wait 2-3 minutes and then repeat steps 1-10. What do the numbers mean? Two numbers make up your blood pressure. The first number is called systolic pressure. The second is called diastolic pressure. An example of a blood pressure reading is "120 over 80" (or 120/80). If you are an adult and do not have a medical condition, use this guide to find out if your blood pressure is normal: Normal  First number: below 120.  Second number: below 80. Elevated  First number: 120-129.  Second number: below 80. Hypertension stage 1  First number: 130-139.  Second number: 80-89. Hypertension stage 2  First number: 140 or above.  Second number: 90 or above. Your blood pressure is above normal even if only the top or bottom number is above normal. Follow these instructions at home:  Check your blood pressure as often as your doctor tells you to.  Take your monitor to your next doctor's appointment. Your doctor will: ? Make sure you are using it correctly. ? Make sure it is working right.  Make sure you understand what your blood pressure numbers should be.  Tell  your doctor if your medicines are causing side effects. Contact a doctor if:  Your blood pressure keeps being high. Get help right away if:  Your first blood pressure number is higher than 180.  Your second blood pressure number is higher than 120. This information is not intended to replace advice given to you by your health care provider. Make sure you discuss any questions you have with your health care provider. Document Released: 07/29/2008 Document Revised: 07/14/2016 Document Reviewed: 01/23/2016 Elsevier Interactive Patient Education  2019 [employer].

## 2019-02-13 NOTE — Telephone Encounter (Signed)

## 2019-02-13 NOTE — Progress Notes (Signed)
Patient ID: Tracey Todd, female    DOB: December 09, 1934, 84 y.o.   MRN: [id number]  PCP: Tracey Jun, FNP  Chief Complaint  Patient presents with  . Diabetes  . Hypertension  . Hyperlipidemia    Subjective:  HPI Tracey Todd is a 84 y.o. female presents for evaluation of hypertension, diabetes and hyperlipidemia.   Tracey Todd has multiple complex  morbid conditions. 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  Hypertension/ Diabetes Monitor blood pressure at home and readings have been increasing at home. She is hypertensive on arrival today 161/93. Reports home readings have been in 510-258 NIDPOEUM/<353 diastolic. Patient is scheduled to follow-up at the Afib clinic on 03/06/19. She denies palpitation, chest pain, shortness of breath or dizziness. She checks blood sugars and reports readings have been good. Last A1C 6.7 (Goal<7.0).  She is inactive of exercise as she suffers from chronic hip pain. Tries to control chronic conditions with medications and diet control.  She suffers from diabetic neuropathy for which she takes gabapentin with relief. Stomach dermatitis  Tracey Todd complains of a few weeks of itching and irritation of the abdomen . No recent soaps or detergent changes. She reports that she was administering LOVENOX injections after undergoing a procedure on her hip. Her INR remained elevated and LOVENOX required extending. Once the Lovenox injections were completed, she reports experiencing persistent itching of her abdomen.  Fine bumps but no visible rash. Itching is localized to the abdomen only. Social History   Socioeconomic History  . Marital status: Married    Spouse name: Not on file  . Number of children: Not on file  . Years of education: Not on file  . Highest education level: Not on file  Occupational History  . Not on file  Social Needs  . Financial resource strain: Not on file  . Food insecurity    Worry: Not on file    Inability: Not on file  . Transportation needs    Medical: Not on file    Non-medical: Not on file  Tobacco Use  . Smoking status: Former Smoker    Quit date: 12/06/1963    Years since quitting: 55.2  . Smokeless tobacco: Never Used  Substance and Sexual Activity  . Alcohol use: No  . Drug use: Never  . Sexual activity: Not on file  Lifestyle  . Physical activity    Days per week: Not on file    Minutes per session: Not on file  . Stress: Not on file  Relationships  . Social Herbalist on phone: Not on file    Gets together: Not on file    Attends religious service: Not on file    Active member of club or organization: Not on file    Attends meetings of clubs or organizations: Not on file    Relationship status: Not on file  . Intimate partner violence    Fear of current or ex partner: Not on file    Emotionally abused: Not on file    Physically abused: Not on file    Forced sexual activity: Not on file  Other  Topics Concern  . Not on file  Social History Narrative  . Not on file    Family History  Problem Relation Age of Onset  . Diabetes Mother   . Cancer Mother   . Cancer Father    Review of Systems Pertinent negatives listed in HPI Patient Active Problem List   Diagnosis Date Noted  . Long term (current) use of anticoagulants 10/02/2018  . Atherosclerosis of aorta ([place]) 07/20/2017  . Diabetic neuropathy ([place]) 07/20/2017  . Moderate to severe pulmonary hypertension ([place]) 11/20/2015  . BMI 34.0-34.9,adult 08/06/2015  . Diabetes  mellitus type 2, controlled ([place]) 04/25/2015  . Sick sinus syndrome ([place]) 01/29/2015  . Essential hypertension 10/22/2014  . PAF-fib and flutter 10/22/2014  . Congestive heart failure ([place]) 10/21/2014  . 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 Obesity 07/08/2014  . Vitamin D deficiency 09/28/2013  . Medication management 09/28/2013  . Hyperlipidemia   . DJD (degenerative joint disease)   . Fatty liver     Allergies  Allergen Reactions  . Penicillins Anaphylaxis  . Quinapril     Angioedema   . Feldene [Piroxicam] Other (See Comments)    Bleeding   . Calcium-Containing Compounds Other (See Comments)    "calcium deposits on skin"  . Celebrex [Celecoxib] Itching  . Daypro [Oxaprozin] Itching    Prior to Admission medications   Medication Sig Start Date End Date Taking? Authorizing Provider  atorvastatin (LIPITOR) 80 MG tablet Take 1/2 tablet by mouth on Monday and Thursday 12/26/18  Yes Tracey Jun, FNP  bisoprolol (ZEBETA) 10 MG tablet Take 1 tablet daily for BP 06/07/18  Yes Unk Pinto, MD  blood glucose meter kit and supplies KIT Dispense based on patient and insurance preference. Check blood sugar 1 time a day-E11.21 04/05/18  Yes Unk Pinto, MD  calcium carbonate (OS-CAL) 600 MG TABS tablet Take 600 mg by mouth daily with breakfast.   Yes [provider]  cetirizine (ZYRTEC ALLERGY) 10 MG tablet Take 10 mg by mouth every other day.  11/28/12  Yes [provider]  Cholecalciferol (VITAMIN D) 2000 UNITS CAPS Take 6,000 Units by mouth daily.    Yes [provider]  furosemide (LASIX) 40 MG tablet Take 1 tablet (40 mg total) by mouth daily. 12/07/18 03/07/19 Yes Allred, Jeneen Rinks, MD  gabapentin (NEURONTIN) 100 MG capsule TAKE 1 TO 2 CAPSULES BY MOUTH THREE TIMES DAILY FOR  NEUROPATHY  PAINS 02/13/19  Yes Tracey Jun, FNP  glucose blood (FREESTYLE LITE) test strip Check blood sugar 1 time daily-DX-E11.21 04/10/18  Yes Unk Pinto, MD  Lancets (FREESTYLE) lancets USE 1 LANCET TO CHECK GLUCOSE ONCE DAILY 06/08/18  Yes [provider]  Magnesium 500 MG TABS Take 500 mg by mouth 4 (four) times daily.    Yes [provider]  metFORMIN (GLUCOPHAGE-XR) 500 MG 24 hr tablet TAKE 1000MG BY MOUTH TWICE DAILY 10/02/18  Yes Tracey Jun, FNP  potassium chloride SA (K-DUR,KLOR-CON) 20 MEQ tablet Take 1 tablet (20 mEq total) by mouth daily. 10/02/18  Yes Tracey Jun, FNP  warfarin (COUMADIN) 5 MG tablet TAKE AS DIRECTED BY COUMADIN CLINIC 01/19/19  Yes Allred, Jeneen Rinks, MD  amLODipine (NORVASC) 5 MG tablet Take 1 tablet (5 mg total) by mouth daily. 02/13/19   Tracey Jun, FNP    Past Medical, Surgical Family and Social History reviewed and updated.    Objective:   Today's Vitals   02/13/19 0900 02/13/19 0912  BP: (!) 179/83 (!) 161/93  Pulse: 64 84  Resp: 17   Temp: 98.2 F (36.8 C)   TempSrc: Temporal   SpO2: 95%   Weight: 181 lb 6.4 oz (82.3 kg)   Height: _0  (1.473 m)     Wt Readings from Last 3 Encounters:  02/13/19 181 lb 6.4 oz (82.3 kg)  01/12/19 181 lb (82.1 kg)  10/19/18 176 lb (79.8 kg)     Physical Exam General appearance: alert, well developed, well nourished, cooperative and in no distress Head: Normocephalic, without obvious abnormality, atraumatic Respiratory: Respirations even and unlabored, normal respiratory rate Heart: distant heart sound, rate normal. No gallop or murmurs noted on exam  Extremities: No gross deformities Skin: Skin color, texture, turgor normal. No rashes seen  Psych: Appropriate mood and affect. Neurologic: Mental status: Alert, oriented to person, place, and time, thought content appropriate.  Lab Results  Component Value Date   POCGLU 85 10/02/2018    Lab Results  Component Value Date   HGBA1C 6.7 (H) 10/02/2018            Assessment & Plan:  1. Diabetic polyneuropathy associated with type 2 diabetes mellitus ([place]) F  recent diabetes has been well controlled.  Patient advised to continue current regimen the day.  6.7. Refilled gabapentin and she can take 2 tablets if pain becomes severe or persistent.  Last A1c  - Hemoglobin A1c  2. Accelerated hypertension Patient will continue bisoprolol and lasix.  Adding amlodipine 5 mg once daily to current regimen in efforts to improve blood pressure. We have discussed target BP range and blood pressure goal. I have advised patient to check BP regularly and to call us back or report to clinic if the numbers are consistently higher than 140/90. We discussed the importance of compliance with medical therapy and DASH diet recommended, consequences of uncontrolled hypertension discussed.   3. Hyperlipidemia, unspecified hyperlipidemia type - Lipid panel; Future  4.  Dermatitis of the skin  -Will trial triamcinolone ointment apply to affected area twice daily. Follow-up if no improvement of symptoms.  5.  Leg swelling  -Currently prescribed Lasix recommend continuation. Recommend purchasing compression socks or stockings to help with fluid retention.  Also recommend elevating legs when sitting for prolonged period of time to help reduce swelling. Also encouraged to reduce sodium and adhere to the DASH diet as this will also facilitate improvement of swelling.    Meds ordered this encounter  Medications  . gabapentin (NEURONTIN) 100 MG capsule    Sig: TAKE 1 TO 2 CAPSULES BY MOUTH THREE TIMES DAILY FOR  NEUROPATHY  PAINS    Dispense:  540 capsule    Refill:  3  . amLODipine (NORVASC) 5 MG tablet    Sig: Take 1 tablet (5 mg total) by mouth daily.    Dispense:  90 tablet    Refill:  3  . triamcinolone (KENALOG) 0.025 % ointment    Sig: Apply 1 application topically 3 (three) times daily as needed.    Dispense:  454 g    Refill:  1   -The patient was given clear instructions to go to ER or return to medical center if symptoms do not improve, worsen or new problems  develop. The patient verbalized understanding.    Molli Barrows, FNP Primary Care at [facility] [address], [place] [phone]: [phone]

## 2019-02-14 LAB — COMPREHENSIVE METABOLIC PANEL
ALT: 9 IU/L (ref 0–32)
AST: 14 IU/L (ref 0–40)
Albumin/Globulin Ratio: 1.8 (ref 1.2–2.2)
Albumin: 4.4 g/dL (ref 3.6–4.6)
Alkaline Phosphatase: 49 IU/L (ref 39–117)
BUN/Creatinine Ratio: 20 (ref 12–28)
BUN: 15 mg/dL (ref 8–27)
Bilirubin Total: 0.4 mg/dL (ref 0.0–1.2)
CO2: 26 mmol/L (ref 20–29)
Calcium: 10 mg/dL (ref 8.7–10.3)
Chloride: 100 mmol/L (ref 96–106)
Creatinine, Ser: 0.74 mg/dL (ref 0.57–1.00)
GFR calc Af Amer: 88 mL/min/{1.73_m2} (ref >59)
GFR calc non Af Amer: 76 mL/min/{1.73_m2} (ref >59)
Globulin, Total: 2.5 g/dL (ref 1.5–4.5)
Glucose: 105 mg/dL — ABNORMAL HIGH (ref 65–99)
Potassium: 4.3 mmol/L (ref 3.5–5.2)
Sodium: 140 mmol/L (ref 134–144)
Total Protein: 6.9 g/dL (ref 6.0–8.5)

## 2019-02-14 LAB — HEMOGLOBIN A1C
Est. average glucose Bld gHb Est-mCnc: 148 mg/dL
Hgb A1c MFr Bld: 6.8 % — ABNORMAL HIGH (ref 4.8–5.6)

## 2019-02-14 LAB — LIPID PANEL
Chol/HDL Ratio: 2.6 ratio (ref 0.0–4.4)
Cholesterol, Total: 197 mg/dL (ref 100–199)
HDL: 75 mg/dL (ref >39)
LDL Calculated: 102 mg/dL — ABNORMAL HIGH (ref 0–99)
Triglycerides: 101 mg/dL (ref 0–149)
VLDL Cholesterol Cal: 20 mg/dL (ref 5–40)

## 2019-02-15 DIAGNOSIS — H40023 Open angle with borderline findings, high risk, bilateral: Secondary | ICD-10-CM | POA: Diagnosis not present

## 2019-02-15 DIAGNOSIS — H04123 Dry eye syndrome of bilateral lacrimal glands: Secondary | ICD-10-CM | POA: Diagnosis not present

## 2019-02-19 NOTE — Progress Notes (Signed)
Patient notified of results & recommendations. Expressed understanding.

## 2019-02-20 ENCOUNTER — Other Ambulatory Visit: Payer: Self-pay

## 2019-02-20 ENCOUNTER — Ambulatory Visit (INDEPENDENT_AMBULATORY_CARE_PROVIDER_SITE_OTHER): Payer: Medicare Other | Admitting: Pharmacist

## 2019-02-20 DIAGNOSIS — I480 Paroxysmal atrial fibrillation: Secondary | ICD-10-CM

## 2019-02-20 DIAGNOSIS — Z7901 Long term (current) use of anticoagulants: Secondary | ICD-10-CM | POA: Diagnosis not present

## 2019-02-20 LAB — POCT INR: INR: 2.7 (ref 2.0–3.0)

## 2019-02-20 NOTE — Patient Instructions (Signed)
Continue taking 1.5 tablets daily, except 1 tablet on Monday, Wednesday and Friday.  Eating 2 serving of greens per week. Recheck INR 3 week. Coumadin Clinic [phone]

## 2019-02-26 ENCOUNTER — Ambulatory Visit: Payer: Medicare Other

## 2019-02-27 ENCOUNTER — Ambulatory Visit (INDEPENDENT_AMBULATORY_CARE_PROVIDER_SITE_OTHER): Payer: Medicare Other | Admitting: *Deleted

## 2019-02-27 ENCOUNTER — Other Ambulatory Visit: Payer: Self-pay

## 2019-02-27 ENCOUNTER — Ambulatory Visit (INDEPENDENT_AMBULATORY_CARE_PROVIDER_SITE_OTHER): Payer: Medicare Other

## 2019-02-27 DIAGNOSIS — I10 Essential (primary) hypertension: Secondary | ICD-10-CM

## 2019-02-27 DIAGNOSIS — Z0130 Encounter for examination of blood pressure without abnormal findings: Secondary | ICD-10-CM | POA: Diagnosis not present

## 2019-02-27 DIAGNOSIS — I495 Sick sinus syndrome: Secondary | ICD-10-CM | POA: Diagnosis not present

## 2019-02-27 LAB — CUP PACEART REMOTE DEVICE CHECK
Battery Remaining Longevity: 108 mo
Battery Remaining Percentage: 95.5 %
Battery Voltage: 2.99 V
Brady Statistic AP VP Percent: 79 %
Brady Statistic AP VS Percent: 20 %
Brady Statistic AS VP Percent: 1 %
Brady Statistic AS VS Percent: 1 %
Brady Statistic RA Percent Paced: 70 %
Brady Statistic RV Percent Paced: 85 %
Date Time Interrogation Session: 20200630060014
Implantable Lead Implant Date: 20160226
Implantable Lead Implant Date: 20160226
Implantable Lead Location: 753859
Implantable Lead Location: 753860
Implantable Lead Model: 1948
Implantable Pulse Generator Implant Date: 20160226
Lead Channel Impedance Value: 360 Ohm
Lead Channel Impedance Value: 450 Ohm
Lead Channel Pacing Threshold Amplitude: 0.5 V
Lead Channel Pacing Threshold Amplitude: 1.25 V
Lead Channel Pacing Threshold Pulse Width: 0.4 ms
Lead Channel Pacing Threshold Pulse Width: 0.4 ms
Lead Channel Sensing Intrinsic Amplitude: 1.4 mV
Lead Channel Sensing Intrinsic Amplitude: 7.8 mV
Lead Channel Setting Pacing Amplitude: 1.5 V
Lead Channel Setting Pacing Amplitude: 2.5 V
Lead Channel Setting Pacing Pulse Width: 0.4 ms
Lead Channel Setting Sensing Sensitivity: 2 mV
Pulse Gen Model: 2240
Pulse Gen Serial Number: 7700661

## 2019-02-27 NOTE — Progress Notes (Signed)
Worked up patient for their televisit BP check. Patient states that since starting the Amlodipine her BP readings have gotten a lot better. Took it prior to phone call & it was 134/74. Readings have typically been in the 134-146/63-74. Denies chest pain, SHOB, lower extremity swelling, headaches, dizziness. KWalker, CMA.

## 2019-03-06 ENCOUNTER — Telehealth: Payer: Self-pay

## 2019-03-06 ENCOUNTER — Other Ambulatory Visit: Payer: Self-pay

## 2019-03-06 ENCOUNTER — Ambulatory Visit (HOSPITAL_COMMUNITY)
Admission: RE | Admit: 2019-03-06 | Discharge: 2019-03-06 | Disposition: A | Discharge status: Home / Self Care | Payer: Medicare Other | Source: Ambulatory Visit | Attending: Nurse Practitioner | Admitting: Nurse Practitioner

## 2019-03-06 ENCOUNTER — Encounter (HOSPITAL_COMMUNITY): Payer: Self-pay | Admitting: Nurse Practitioner

## 2019-03-06 VITALS — BP 130/62 | HR 60 | Ht <= 58 in | Wt 185.0 lb

## 2019-03-06 DIAGNOSIS — I495 Sick sinus syndrome: Secondary | ICD-10-CM | POA: Insufficient documentation

## 2019-03-06 DIAGNOSIS — Z79899 Other long term (current) drug therapy: Secondary | ICD-10-CM | POA: Diagnosis not present

## 2019-03-06 DIAGNOSIS — E7800 Pure hypercholesterolemia, unspecified: Secondary | ICD-10-CM | POA: Diagnosis not present

## 2019-03-06 DIAGNOSIS — Z886 Allergy status to analgesic agent status: Secondary | ICD-10-CM | POA: Diagnosis not present

## 2019-03-06 DIAGNOSIS — Z87891 Personal history of nicotine dependence: Secondary | ICD-10-CM | POA: Diagnosis not present

## 2019-03-06 DIAGNOSIS — I5032 Chronic diastolic (congestive) heart failure: Secondary | ICD-10-CM | POA: Insufficient documentation

## 2019-03-06 DIAGNOSIS — E559 Vitamin D deficiency, unspecified: Secondary | ICD-10-CM | POA: Insufficient documentation

## 2019-03-06 DIAGNOSIS — I110 Hypertensive heart disease with heart failure: Secondary | ICD-10-CM | POA: Diagnosis not present

## 2019-03-06 DIAGNOSIS — Z7984 Long term (current) use of oral hypoglycemic drugs: Secondary | ICD-10-CM | POA: Diagnosis not present

## 2019-03-06 DIAGNOSIS — Z880 Allergy status to penicillin: Secondary | ICD-10-CM | POA: Diagnosis not present

## 2019-03-06 DIAGNOSIS — M1990 Unspecified osteoarthritis, unspecified site: Secondary | ICD-10-CM | POA: Diagnosis not present

## 2019-03-06 DIAGNOSIS — Z888 Allergy status to other drugs, medicaments and biological substances status: Secondary | ICD-10-CM | POA: Insufficient documentation

## 2019-03-06 DIAGNOSIS — Z7901 Long term (current) use of anticoagulants: Secondary | ICD-10-CM | POA: Insufficient documentation

## 2019-03-06 DIAGNOSIS — Z950 Presence of cardiac pacemaker: Secondary | ICD-10-CM | POA: Diagnosis not present

## 2019-03-06 DIAGNOSIS — I480 Paroxysmal atrial fibrillation: Secondary | ICD-10-CM

## 2019-03-06 DIAGNOSIS — I2510 Atherosclerotic heart disease of native coronary artery without angina pectoris: Secondary | ICD-10-CM | POA: Insufficient documentation

## 2019-03-06 DIAGNOSIS — I272 Pulmonary hypertension, unspecified: Secondary | ICD-10-CM | POA: Diagnosis not present

## 2019-03-06 DIAGNOSIS — E119 Type 2 diabetes mellitus without complications: Secondary | ICD-10-CM | POA: Diagnosis not present

## 2019-03-06 DIAGNOSIS — I4819 Other persistent atrial fibrillation: Secondary | ICD-10-CM | POA: Diagnosis not present

## 2019-03-06 NOTE — Telephone Encounter (Signed)

## 2019-03-06 NOTE — Progress Notes (Addendum)
Date:  03/06/2019   ID:  Tracey Todd, DOB Apr 02, 1935, MRN [id number]   Provider location: [address] [place], [place] [address] Evaluation Performed:Follow up  PCP:  Scot Jun, FNP  Primary Cardiologist:   Dr. Stanford Breed Primary Electrophysiologist: Dr. Rayann Heman  YI:[id number] afib    History of Present Illness: Tracey SCHEXNIDER is a 84 y.o. female   Pt was  seen by Dr. Rayann Heman for persistent afib since Shingles in February. She overall reported that she felt well but reported extra fluid . She was in afib at the time. Her lasix was doubled at that visit and referred to afib clinic for f/u Dr. Rayann Heman mentioned pursuing return to [facility] after covid.   On the telephone visit with pt earlier this spirng, pt sounded well, she had no complaints. Said that she did not notice the afib. Her weight was staying stable with increase of lasix and changing her diet to minimize salt. LLE has diminished.  F/u afib clinic 5/12. She reported that she is doing well. She does not notice any afib. Does not notice any extra fluid. She is pending a hip injection 5/21 and will be on lovenox prior to the injection. This is being arranged thru [facility] coumadin clinic.Her BP was 130/60 with a  HR OF 62 bpm.  F/u in afib clinic, 03/06/19. She is a paced today. She feels well. States PCP put her on 5 mg amlodipine and BP today is stable at 130/62.minimal LLE.    Today, she denies symptoms of palpitations, chest pain, shortness of breath, orthopnea, PND, chronic lower extremity edema, claudication, dizziness, presyncope, syncope, bleeding, or neurologic sequela. The patient is tolerating medications without difficulties and is otherwise without complaint today.   she denies symptoms of cough, fevers, chills, or new SOB worrisome for COVID 19.   Atrial Fibrillation Risk Factors:  she does not have symptoms or diagnosis of sleep apnea. she does not have a history of rheumatic fever. she does not  have a history of alcohol use. The patient does not have a history of early familial atrial fibrillation or other arrhythmias.  she has a BMI of Body mass index is 38.67 kg/m.Marland Kitchen Filed Weights   03/06/19 0837  Weight: 83.9 kg    Past Medical History:  Diagnosis Date  . Atrial flutter ([place])    a. mentioned in 2016 admission.  . Chronic diastolic CHF (congestive heart failure) ([place])   . DJD (degenerative joint disease)   . Fatty liver   . 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 HTN (hypertension)   . Hypercholesteremia   . Mild CAD    a. minimal by cath 2016.  . Nodule of chest wall   . Normal coronary arteries and LVF 10/23/14 10/24/2014  . Pacemaker implanted 10/25/14- [employer] Jude 10/26/2014  . Paroxysmal atrial fibrillation ([facility])    a. identified on device interrogation, burden low  . Pulmonary hypertension ([place])   . Sick sinus syndrome ([facility])    a. s/p STJ dual chamber PPM   . Type II or unspecified type diabetes mellitus without mention of complication, not stated as uncontrolled   . Vitamin D deficiency    Past Surgical History:  Procedure Laterality Date  . BREAST SURGERY    . CATARACT EXTRACTION    . LEFT AND RIGHT HEART CATHETERIZATION WITH CORONARY ANGIOGRAM N/A 10/23/2014   Procedure: LEFT AND RIGHT HEART CATHETERIZATION WITH CORONARY ANGIOGRAM;  Surgeon: Blane Ohara, MD;  Location: [facility] CATH [facility];  Service: Cardiovascular;  Laterality: N/A;  . PERMANENT PACEMAKER INSERTION N/A 10/25/2014   STJ dual chamber PPM implanted by Dr Rayann Heman for SSS  . TONSILLECTOMY AND ADENOIDECTOMY    . TOTAL KNEE ARTHROPLASTY       Current Outpatient Medications  Medication Sig Dispense Refill  . amLODipine (NORVASC) 5 MG tablet Take 1 tablet (5 mg total) by mouth daily. 90 tablet 3  . atorvastatin (LIPITOR) 80 MG tablet Take 1/2 tablet by mouth on Monday and Thursday 30 tablet 3  . bisoprolol (ZEBETA) 10 MG tablet Take 1 tablet daily for BP 90 tablet 1  . blood glucose meter kit and  supplies KIT Dispense based on patient and insurance preference. Check blood sugar 1 time a day-E11.21 1 each 0  . calcium carbonate (OS-CAL) 600 MG TABS tablet Take 600 mg by mouth daily with breakfast.    . cetirizine (ZYRTEC ALLERGY) 10 MG tablet Take 10 mg by mouth every other day.     . Cholecalciferol (VITAMIN D) 2000 UNITS CAPS Take 6,000 Units by mouth daily.     . furosemide (LASIX) 40 MG tablet Take 1 tablet (40 mg total) by mouth daily. 90 tablet 3  . gabapentin (NEURONTIN) 100 MG capsule TAKE 1 TO 2 CAPSULES BY MOUTH THREE TIMES DAILY FOR  NEUROPATHY  PAINS 540 capsule 3  . glucose blood (FREESTYLE LITE) test strip Check blood sugar 1 time daily-DX-E11.21 100 each 1  . Lancets (FREESTYLE) lancets USE 1 LANCET TO CHECK GLUCOSE ONCE DAILY  1  . Magnesium 500 MG TABS Take 500 mg by mouth 4 (four) times daily.     . metFORMIN (GLUCOPHAGE-XR) 500 MG 24 hr tablet TAKE '1000MG'$  BY MOUTH TWICE DAILY 360 tablet 2  . potassium chloride SA (K-DUR,KLOR-CON) 20 MEQ tablet Take 1 tablet (20 mEq total) by mouth daily. (Patient taking differently: Take 10 mEq by mouth daily. ) 90 tablet 1  . triamcinolone (KENALOG) 0.025 % ointment Apply 1 application topically 3 (three) times daily as needed. 454 g 1  . warfarin (COUMADIN) 5 MG tablet TAKE AS DIRECTED BY COUMADIN CLINIC 40 tablet 2   No current facility-administered medications for this encounter.     Allergies:   Penicillins, Quinapril, Feldene [piroxicam], Calcium-containing compounds, Celebrex [celecoxib], and Daypro [oxaprozin]   Social History:  The patient  reports that she quit smoking about 55 years ago. She has never used smokeless tobacco. She reports that she does not drink alcohol or use drugs.   Family History:  The patient's  family history includes Cancer in her father and mother; Diabetes in her mother.    ROS:  Please see the history of present illness.   All other systems are personally reviewed and negative.   Exam: GEN- The  patient is well appearing elderly female, alert and oriented x 3 today.   Head- normocephalic, atraumatic Eyes-  Sclera clear, conjunctiva pink Ears- hearing intact Oropharynx- clear Lungs- Clear to ausculation bilaterally, normal work of breathing Chest- pacemaker pocket is well healed Heart- Regular rate and rhythm, no murmurs, rubs or gallops, PMI not laterally displaced GI- soft, NT, ND, + BS Extremities- no clubbing, cyanosis, or edema  Recent Labs: 08/21/2018: Magnesium 2.0 08/22/2018: Hemoglobin 11.6; Platelets 300; TSH 3.900 02/13/2019: ALT 9; BUN 15; Creatinine, Ser 0.74; Potassium 4.3; Sodium 140  personally reviewed    Other studies personally reviewed: Epic records reviewed Ekg-a paced with prolonged AV conduction PR int 294 bpm, qrs int 86  ms, qtc 416 ms Cup paceart device report reviewed form 02/27/19    ASSESSMENT AND PLAN:  1. afib Had been persistent by previous device check but is a paced today  Did not feel any different in afib vrs SR Had an increase of  Lasix several weeks ago and is tolerating No change in approach today Continue  Warfarin  per [employer] Recent device check showed afib burden at 30% with normal device function   2. HF Stable Denies any fluid overload Continue low salt diet and 40 mg lasix daily Labs checked with pcp 6/16, stable   This patients CHA2DS2-VASc Score and unadjusted Ischemic Stroke Rate (% per year) is equal to 4.8 % stroke rate/year from a score of 4  Above score calculated as 1 point each if present [CHF, HTN, DM, Vascular=MI/PAD/Aortic Plaque, Age if 65-74, or Female] Above score calculated as 2 points each if present [Age > 75, or Stroke/TIA/TE]   COVID screen The patient does not have any symptoms that suggest any further testing/ screening at this time.  Social distancing reinforced today.   F/u in device clinic 8/20 afib clinic as needed  Labs/ tests ordered today include: none No orders of the defined  types were placed in this encounter.   Signed, Roderic Palau NP 03/06/2019 9:02 AM  Afib [facility] [address] [place], [place] [address] [phone]

## 2019-03-06 NOTE — Addendum Note (Signed)
Encounter addended by: Sherran Needs, NP on: 03/06/2019 9:36 AM  Actions taken: Clinical Note Signed

## 2019-03-10 ENCOUNTER — Encounter: Payer: Self-pay | Admitting: Cardiology

## 2019-03-10 NOTE — Progress Notes (Signed)
Remote pacemaker transmission.   

## 2019-03-13 ENCOUNTER — Other Ambulatory Visit: Payer: Self-pay

## 2019-03-13 ENCOUNTER — Ambulatory Visit (INDEPENDENT_AMBULATORY_CARE_PROVIDER_SITE_OTHER): Payer: Medicare Other | Admitting: *Deleted

## 2019-03-13 DIAGNOSIS — Z7901 Long term (current) use of anticoagulants: Secondary | ICD-10-CM | POA: Diagnosis not present

## 2019-03-13 DIAGNOSIS — I480 Paroxysmal atrial fibrillation: Secondary | ICD-10-CM | POA: Diagnosis not present

## 2019-03-13 LAB — POCT INR: INR: 2.7 (ref 2.0–3.0)

## 2019-03-13 NOTE — Patient Instructions (Signed)
Description   Continue taking 1.5 tablets daily, except 1 tablet on Monday, Wednesday and Friday.  Eating 2 serving of greens per week. Recheck INR 5 weeks. Coumadin Clinic [phone]

## 2019-04-06 ENCOUNTER — Other Ambulatory Visit: Payer: Self-pay | Admitting: Internal Medicine

## 2019-04-11 ENCOUNTER — Ambulatory Visit (INDEPENDENT_AMBULATORY_CARE_PROVIDER_SITE_OTHER): Payer: Medicare Other | Admitting: Podiatry

## 2019-04-11 ENCOUNTER — Other Ambulatory Visit: Payer: Self-pay

## 2019-04-11 ENCOUNTER — Encounter: Payer: Self-pay | Admitting: Podiatry

## 2019-04-11 DIAGNOSIS — E1121 Type 2 diabetes mellitus with diabetic nephropathy: Secondary | ICD-10-CM

## 2019-04-11 DIAGNOSIS — D689 Coagulation defect, unspecified: Secondary | ICD-10-CM | POA: Diagnosis not present

## 2019-04-11 DIAGNOSIS — B351 Tinea unguium: Secondary | ICD-10-CM | POA: Diagnosis not present

## 2019-04-11 DIAGNOSIS — M79676 Pain in unspecified toe(s): Secondary | ICD-10-CM | POA: Diagnosis not present

## 2019-04-11 NOTE — Progress Notes (Signed)
Patient ID: Tracey Todd, female   DOB: [birth year], 84 y.o.   MRN: [id number] Complaint:  Visit Type: Patient returns to my office for continued preventative foot care services. Complaint: Patient states" my nails have grown long and thick and become painful to walk and wear shoes" Patient has been diagnosed with DM with no foot complications... The patient presents for preventative foot care services. No changes to ROS.  Patient is now taking coumadin.  Podiatric Exam: Vascular: dorsalis pedis and posterior tibial pulses are palpable bilateral. Capillary return is immediate. Temperature gradient is WNL. Skin turgor WNL  Sensorium: Diminished  Semmes Weinstein monofilament test. Normal tactile sensation bilaterally. Nail Exam: Pt has thick disfigured discolored nails with subungual debris noted bilateral entire nail hallux through fifth toenails Ulcer Exam: There is no evidence of ulcer or pre-ulcerative changes or infection. Orthopedic Exam: Muscle tone and strength are WNL. No limitations in general ROM. No crepitus or effusions noted. Foot type and digits show no abnormalities. Mild dorsomedial exostosis noted. Skin: No Porokeratosis. No infection or ulcers  Diagnosis:  Onychomycosis, , Pain in right toe, pain in left toes  Treatment & Plan Procedures and Treatment: Consent by patient was obtained for treatment procedures. The patient understood the discussion of treatment and procedures well. All questions were answered thoroughly reviewed. Debridement of mycotic and hypertrophic toenails, 1 through 5 bilateral and clearing of subungual debris. No ulceration, no infection noted. Minimal mechanical debridement requested.  . Return Visit-Office Procedure: Patient instructed to return to the office for a follow up visit 3 months for continued evaluation and treatment.    Gardiner Barefoot DPM

## 2019-04-18 NOTE — Progress Notes (Signed)
Electrophysiology Office Note Date: 04/19/2019  ID:  Tracey Todd, Tracey Todd 1935-05-28, MRN [id number]  PCP: Scot Jun, FNP Primary Cardiologist: Johnsie Cancel Electrophysiologist: Allred  CC: Pacemaker follow-up  Tracey Todd is a 84 y.o. female seen today for Dr Rayann Heman.  She presents today for routine electrophysiology followup.  Since last being seen in our clinic, the patient reports doing reasonably well.  She had persistent AF earlier this year at the time of shingles. She was not symptomatic and has reverted to SR. She has not been able to be as active because of COVID.  Her hip pain limits her activity as well.   She denies chest pain, palpitations, dyspnea, PND, orthopnea, nausea, vomiting, dizziness, syncope, edema, weight gain, or early satiety.  Device History: STJ dual chamber PPM implanted 2016 for SSS   Past Medical History:  Diagnosis Date   Atrial flutter ([place])    a. mentioned in 2016 admission.   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 minimal by cath 2016.   Nodule of chest wall    Normal coronary arteries and LVF 10/23/14 10/24/2014   Pacemaker implanted 10/25/14- St Jude 10/26/2014   Paroxysmal atrial fibrillation (HCC)    a. identified on device interrogation, burden low   Pulmonary hypertension (HCC)    Sick sinus syndrome ([place])    a. s/p STJ dual chamber PPM    Type II or unspecified type diabetes mellitus without mention of complication, not stated as uncontrolled    Vitamin D deficiency    Past Surgical History:  Procedure Laterality Date   BREAST SURGERY     CATARACT EXTRACTION     LEFT AND RIGHT HEART CATHETERIZATION WITH CORONARY ANGIOGRAM N/A 10/23/2014   Procedure: LEFT AND RIGHT HEART CATHETERIZATION WITH CORONARY ANGIOGRAM;  Surgeon: Blane Ohara, MD;   Location: [facility] CATH LAB;  Service: Cardiovascular;  Laterality: N/A;   PERMANENT PACEMAKER INSERTION N/A 10/25/2014   STJ dual chamber PPM implanted by Dr Rayann Heman for SSS   TONSILLECTOMY AND ADENOIDECTOMY     TOTAL KNEE ARTHROPLASTY      Current Outpatient Medications  Medication Sig Dispense Refill   amLODipine (NORVASC) 5 MG tablet Take 1 tablet (5 mg total) by mouth daily. 90 tablet 3   atorvastatin (LIPITOR) 80 MG tablet Take 1/2 tablet by mouth on Monday and Thursday 30 tablet 3   bisoprolol (ZEBETA) 10 MG tablet Take 1 tablet daily for BP 90 tablet 1   blood glucose meter kit and supplies KIT Dispense based on patient and insurance preference. Check blood sugar 1 time a day-E11.21 1 each 0   calcium carbonate (OS-CAL) 600 MG TABS tablet Take 600 mg by mouth daily with breakfast.     cetirizine (ZYRTEC ALLERGY) 10 MG tablet Take 10 mg by mouth every other day.      Cholecalciferol (VITAMIN D) 2000 UNITS CAPS Take 6,000 Units by mouth daily.      furosemide (LASIX) 40 MG tablet Take 1 tablet (40 mg total) by mouth daily. 90 tablet 3   glucose blood (FREESTYLE LITE) test strip Check blood sugar 1 time daily-DX-E11.21 100 each 1   Lancets (FREESTYLE) lancets USE 1 LANCET TO CHECK GLUCOSE ONCE DAILY  1   Magnesium 500 MG TABS Take 500 mg  by mouth 4 (four) times daily.      metFORMIN (GLUCOPHAGE-XR) 500 MG 24 hr tablet TAKE '1000MG'$  BY MOUTH TWICE DAILY 360 tablet 2   potassium chloride SA (K-DUR,KLOR-CON) 20 MEQ tablet Take 1 tablet (20 mEq total) by mouth daily. 90 tablet 1   warfarin (COUMADIN) 5 MG tablet TAKE 1 & 1/2 (ONE & ONE-HALF) TABLETS BY MOUTH ONCE DAILY OR  AS  DIRECTED  BY  COUMADIN  CLINIC 40 tablet 2   No current facility-administered medications for this visit.     Allergies:   Penicillins, Quinapril, Feldene [piroxicam], Calcium-containing compounds, Celebrex [celecoxib], and Daypro [oxaprozin]   Social History: Social History   Socioeconomic History    Marital status: Married    Spouse name: Not on file   Number of children: Not on file   Years of education: Not on file   Highest education level: Not on file  Occupational History   Not on file  Social Needs   Financial resource strain: Not on file   Food insecurity    Worry: Not on file    Inability: Not on file   Transportation needs    Medical: Not on file    Non-medical: Not on file  Tobacco Use   Smoking status: Former Smoker    Quit date: 12/06/1963    Years since quitting: 55.4   Smokeless tobacco: Never Used  Substance and Sexual Activity   Alcohol use: No   Drug use: Never   Sexual activity: Not on file  Lifestyle   Physical activity    Days per week: Not on file    Minutes per session: Not on file   Stress: Not on file  Relationships   Social connections    Talks on phone: Not on file    Gets together: Not on file    Attends religious service: Not on file    Active member of club or organization: Not on file    Attends meetings of clubs or organizations: Not on file    Relationship status: Not on file   Intimate partner violence    Fear of current or ex partner: Not on file    Emotionally abused: Not on file    Physically abused: Not on file    Forced sexual activity: Not on file  Other Topics Concern   Not on file  Social History Narrative   Not on file    Family History: Family History  Problem Relation Age of Onset   Diabetes Mother    Cancer Mother    Cancer Father      Review of Systems: All other systems reviewed and are otherwise negative except as noted above.   Physical Exam: VS:  BP 140/60    Pulse 62    Ht '4\' 10"'$  (1.473 m)    Wt 186 lb 9.6 oz (84.6 kg)    SpO2 92%    BMI 39.00 kg/m  , BMI Body mass index is 39 kg/m.  GEN- The patient is well appearing, alert and oriented x 3 today.   HEENT: normocephalic, atraumatic; sclera clear, conjunctiva pink; hearing intact; oropharynx clear; neck supple  Lungs- Clear  to ausculation bilaterally, normal work of breathing.  No wheezes, rales, rhonchi Heart- Regular rate and rhythm (paced) GI- soft, non-tender, non-distended, bowel sounds present  Extremities- no clubbing, cyanosis, or edema  MS- no significant deformity or atrophy Skin- warm and dry, no rash or lesion; PPM pocket well healed Psych- euthymic mood, full  affect Neuro- strength and sensation are intact  PPM Interrogation- reviewed in detail today,  See PACEART report  EKG:  EKG is not ordered today.  Recent Labs: 08/21/2018: Magnesium 2.0 08/22/2018: Hemoglobin 11.6; Platelets 300; TSH 3.900 02/13/2019: ALT 9; BUN 15; Creatinine, Ser 0.74; Potassium 4.3; Sodium 140   Wt Readings from Last 3 Encounters:  04/19/19 186 lb 9.6 oz (84.6 kg)  03/06/19 185 lb (83.9 kg)  02/13/19 181 lb 6.4 oz (82.3 kg)     Other studies Reviewed: Additional studies/ records that were reviewed today include: AF clinic notes  Assessment and Plan:  1.  Symptomatic bradycardia Normal PPM function See Pace Art report No changes today  2.  Persistent atrial fibrillation/atrial flutter [place] by device interrogation 22%. She is not symptomatic with her AF. If recurrence, would not pursue rhythm control as long as V rates are controlled.  Continue Warfarin for CHADS2VASC of 5  3.  Obesity Body mass index is 39 kg/m. Weight loss encouraged  4.  HTN Stable No change required today    Current medicines are reviewed at length with the patient today.   The patient does not have concerns regarding her medicines.  The following changes were made today:  none  Labs/ tests ordered today include: none No orders of the defined types were placed in this encounter.    Disposition:   Follow up with Merlin, me in 1 year     Signed, Chanetta Marshall, NP 04/19/2019 [address] [address] [phone] (office) [phone] (fax)

## 2019-04-19 ENCOUNTER — Other Ambulatory Visit: Payer: Self-pay

## 2019-04-19 ENCOUNTER — Ambulatory Visit (INDEPENDENT_AMBULATORY_CARE_PROVIDER_SITE_OTHER): Payer: Medicare Other | Admitting: Nurse Practitioner

## 2019-04-19 ENCOUNTER — Encounter: Payer: Self-pay | Admitting: Nurse Practitioner

## 2019-04-19 ENCOUNTER — Ambulatory Visit (INDEPENDENT_AMBULATORY_CARE_PROVIDER_SITE_OTHER): Payer: Medicare Other | Admitting: *Deleted

## 2019-04-19 VITALS — BP 140/60 | HR 62 | Ht <= 58 in | Wt 186.6 lb

## 2019-04-19 DIAGNOSIS — I10 Essential (primary) hypertension: Secondary | ICD-10-CM | POA: Diagnosis not present

## 2019-04-19 DIAGNOSIS — I480 Paroxysmal atrial fibrillation: Secondary | ICD-10-CM

## 2019-04-19 DIAGNOSIS — R001 Bradycardia, unspecified: Secondary | ICD-10-CM

## 2019-04-19 DIAGNOSIS — Z7901 Long term (current) use of anticoagulants: Secondary | ICD-10-CM | POA: Diagnosis not present

## 2019-04-19 DIAGNOSIS — I4819 Other persistent atrial fibrillation: Secondary | ICD-10-CM | POA: Diagnosis not present

## 2019-04-19 DIAGNOSIS — Z6834 Body mass index (BMI) 34.0-34.9, adult: Secondary | ICD-10-CM | POA: Diagnosis not present

## 2019-04-19 LAB — POCT INR: INR: 2.6 (ref 2.0–3.0)

## 2019-04-19 NOTE — Patient Instructions (Signed)
Medication Instructions:  none If you need a refill on your cardiac medications before your next appointment, please call your pharmacy.   Lab work: none If you have labs (blood work) drawn today and your tests are completely normal, you will receive your results only by: Marland Kitchen MyChart Message (if you have MyChart) OR . A paper copy in the mail If you have any lab test that is abnormal or we need to change your treatment, we will call you to review the results.  Testing/Procedures: none  Follow-Up: Administrator, arts 1 YEAR At [facility], you and your health needs are our priority.  As part of our continuing mission to provide you with exceptional heart care, we have created designated Provider Care Teams.  These Care Teams include your primary Cardiologist (physician) and Advanced Practice Providers (APPs -  Physician Assistants and Nurse Practitioners) who all work together to provide you with the care you need, when you need it. .   Any Other Special Instructions Will Be Listed Below (If Applicable). Remote monitoring is used to monitor your Pacemaker  from home. This monitoring reduces the number of office visits required to check your device to one time per year. It allows [place] to keep an eye on the functioning of your device to ensure it is working properly. You are scheduled for a device check from home on 05/29/19. You may send your transmission at any time that day. If you have a wireless device, the transmission will be sent automatically. After your physician reviews your transmission, you will receive a postcard with your next transmission date.

## 2019-04-19 NOTE — Patient Instructions (Addendum)
  Description   Continue taking 1.5 tablets daily, except 1 tablet on Monday, Wednesday and Friday.  Eating 2 serving of greens per week. Recheck INR 6 weeks. Coumadin Clinic [phone]

## 2019-04-20 LAB — CUP PACEART INCLINIC DEVICE CHECK
Battery Remaining Longevity: 102 mo
Battery Voltage: 2.99 V
Brady Statistic RA Percent Paced: 77 %
Brady Statistic RV Percent Paced: 85 %
Date Time Interrogation Session: 20200820115740
Implantable Lead Implant Date: 20160226
Implantable Lead Implant Date: 20160226
Implantable Lead Location: 753859
Implantable Lead Location: 753860
Implantable Lead Model: 1948
Implantable Pulse Generator Implant Date: 20160226
Lead Channel Impedance Value: 375 Ohm
Lead Channel Impedance Value: 462.5 Ohm
Lead Channel Pacing Threshold Amplitude: 0.625 V
Lead Channel Pacing Threshold Amplitude: 1 V
Lead Channel Pacing Threshold Amplitude: 1 V
Lead Channel Pacing Threshold Pulse Width: 0.4 ms
Lead Channel Pacing Threshold Pulse Width: 0.4 ms
Lead Channel Pacing Threshold Pulse Width: 0.4 ms
Lead Channel Sensing Intrinsic Amplitude: 1.4 mV
Lead Channel Sensing Intrinsic Amplitude: 7.4 mV
Lead Channel Setting Pacing Amplitude: 1.625
Lead Channel Setting Pacing Amplitude: 2.5 V
Lead Channel Setting Pacing Pulse Width: 0.4 ms
Lead Channel Setting Sensing Sensitivity: 2 mV
Pulse Gen Model: 2240
Pulse Gen Serial Number: 7700661

## 2019-05-15 ENCOUNTER — Telehealth: Payer: Self-pay

## 2019-05-15 NOTE — Telephone Encounter (Signed)
Called patient to do their pre-visit COVID screening.  Spouse answered phone. Unable to do prescreening.

## 2019-05-16 ENCOUNTER — Ambulatory Visit (INDEPENDENT_AMBULATORY_CARE_PROVIDER_SITE_OTHER): Payer: Medicare Other | Admitting: Nurse Practitioner

## 2019-05-16 ENCOUNTER — Other Ambulatory Visit: Payer: Self-pay

## 2019-05-16 VITALS — BP 120/74 | HR 70 | Temp 97.2°F | Resp 17 | Ht <= 58 in | Wt 185.8 lb

## 2019-05-16 DIAGNOSIS — E782 Mixed hyperlipidemia: Secondary | ICD-10-CM

## 2019-05-16 DIAGNOSIS — E1159 Type 2 diabetes mellitus with other circulatory complications: Secondary | ICD-10-CM | POA: Diagnosis not present

## 2019-05-16 DIAGNOSIS — Z23 Encounter for immunization: Secondary | ICD-10-CM | POA: Diagnosis not present

## 2019-05-16 DIAGNOSIS — K760 Fatty (change of) liver, not elsewhere classified: Secondary | ICD-10-CM

## 2019-05-16 DIAGNOSIS — I10 Essential (primary) hypertension: Secondary | ICD-10-CM | POA: Diagnosis not present

## 2019-05-16 DIAGNOSIS — E559 Vitamin D deficiency, unspecified: Secondary | ICD-10-CM

## 2019-05-16 LAB — GLUCOSE, POCT (MANUAL RESULT ENTRY): POC Glucose: 125 mg/dl — AB (ref 70–99)

## 2019-05-16 MED ORDER — AMLODIPINE BESYLATE 5 MG PO TABS: 5.0000 mg | ORAL_TABLET | Freq: Two times a day (BID) | ORAL | 0 refills | Status: DC

## 2019-05-16 NOTE — Progress Notes (Signed)
Blood sugars have been between 110-125 fasting.

## 2019-05-16 NOTE — Patient Instructions (Signed)
BIOTIN for hair, skin and nails

## 2019-05-16 NOTE — Progress Notes (Signed)
Assessment & Plan:  Alliana was seen today for diabetes, hypertension and hyperlipidemia.  Diagnoses and all orders for this visit:  Type 2 diabetes mellitus with other circulatory complication, unspecified whether long term insulin use (HCC) -     Glucose (CBG) -     Microalbumin/Creatinine Ratio, Urine -     CMP14+EGFR Continue blood sugar control as discussed in office today, low carbohydrate diet, and regular physical exercise as tolerated, 150 minutes per week (30 min each day, 5 days per week, or 50 min 3 days per week). Keep blood sugar logs with fasting goal of 90-130 mg/dl, post prandial (after you eat) less than 180.  For Hypoglycemia: BS <60 and Hyperglycemia BS >400; contact the clinic ASAP. Annual eye exams and foot exams are recommended.   Essential hypertension -     CMP14+EGFR Continue all antihypertensives as prescribed.  Remember to bring in your blood pressure log with you for your follow up appointment.  DASH/Mediterranean Diets are healthier choices for HTN.   Hyperlipidemia -     Lipid panel INSTRUCTIONS: Work on a low fat, heart healthy diet and participate in regular aerobic exercise program by working out at least 150 minutes per week; 5 days a week-30 minutes per day. Avoid red meat, fried foods. junk foods, sodas, sugary drinks, unhealthy snacking, alcohol and smoking.  Drink at least 48oz of water per day and monitor your carbohydrate intake daily.    Fatty liver -     CMP14+EGFR  Vitamin D deficiency disease -     VITAMIN D 25 Hydroxy (Vit-D Deficiency, Fractures)  Influenza vaccine administered -     Flu Vaccine QUAD 6+ mos PF IM (Fluarix Quad PF)    Patient has been counseled on age-appropriate routine health concerns for screening and prevention. These are reviewed and up-to-date. Referrals have been placed accordingly. Immunizations are up-to-date or declined.    Subjective:   Chief Complaint  Patient presents with  . Diabetes  .  Hypertension  . Hyperlipidemia   HPI Tracey Todd 84 y.o. female presents to office today for follow up.   has a past medical history of Atrial flutter ([place]), Chronic diastolic CHF (congestive heart failure) ([place]), DJD (degenerative joint disease), Fatty liver, H/O total knee replacement (02/13/2013), HTN (hypertension), Hypercholesteremia, Mild CAD, Nodule of chest wall, Normal coronary arteries and LVF 10/23/14 (10/24/2014), Pacemaker implanted 10/25/14- St Jude (10/26/2014), Paroxysmal atrial fibrillation ([place]), Pulmonary hypertension ([place]), Sick sinus syndrome ([place]), Type II or unspecified type diabetes mellitus without mention of complication, not stated as uncontrolled, and Vitamin D deficiency.  Seeing [facility] for her chronic cardiac conditions.   Hypertension Chronic. Stable. She is taking amlodipine 5 mg BID. She has been taking 10 mg for a few months as she was seeing higher SBP readings at home and had an elevated reading in the office per her report. States readings high as 150s. She was instructed by her PCP to take '5mg'$  additionally in the evening.   Blood pressure is well controlled at home.  Cardiac symptoms intermittent lower extremity edema. However she does not wear her compression stockings during the day. Denies chest pain, shortness of breath, palpitations, lightheadedness, dizziness, headaches. Other antihypertensives include: Bisoprolol 10 mg daily and furosemide 40 mg daily for CHF. She is not very active due to left hip OA.  BP Readings from Last 3 Encounters:  05/16/19 120/74  04/19/19 140/60  03/06/19 130/62    Hyperlipidemia Patient presents for follow up to hyperlipidemia.  She is medication compliant taking atorvastatin '80mg'$ . She takes half a tablet twice a week as she was previously experiencing myalgias with taking the full dose every day. No longer experiencing muscle cramps since dosage change. Taking 1/2 tablet every Monday and Thursday.  Lab Results   Component Value Date   CHOL 197 02/13/2019   Lab Results  Component Value Date   HDL 75 02/13/2019   Lab Results  Component Value Date   LDLCALC 102 (H) 02/13/2019   Lab Results  Component Value Date   TRIG 101 02/13/2019   Lab Results  Component Value Date   CHOLHDL 2.6 02/13/2019      Diabetes Mellitus Type II Well controlled.  Currently taking metformin XR 500 mg twice a day as prescribed. Eye exam current (within one year): yes Weight trend: stable Current monitoring regimen: home blood tests - daily Home blood sugar records: fasting range: 110-125 Any episodes of hypoglycemia? no Is She on ACE inhibitor or angiotensin II receptor blocker?  No, she has ACE intolerance with history of angioedema when taking quinapril Lab Results  Component Value Date   HGBA1C 6.8 (H) 02/13/2019   HGBA1C 6.7 (H) 10/02/2018   HGBA1C 6.9 (H) 06/02/2018   Review of Systems  Constitutional: Negative for fever, malaise/fatigue and weight loss.  HENT: Negative.  Negative for nosebleeds.   Eyes: Negative.  Negative for blurred vision, double vision and photophobia.  Respiratory: Negative.  Negative for cough and shortness of breath.   Cardiovascular: Positive for leg swelling (Mild and intermittent). Negative for chest pain and palpitations.  Gastrointestinal: Negative.  Negative for heartburn, nausea and vomiting.  Musculoskeletal: Positive for joint pain. Negative for myalgias.  Neurological: Negative.  Negative for dizziness, focal weakness, seizures and headaches.  Psychiatric/Behavioral: Negative.  Negative for suicidal ideas.    Past Medical History:  Diagnosis Date  . Atrial flutter ([place])    a. mentioned in 2016 admission.  . Chronic diastolic CHF (congestive heart failure) ([place])   . DJD (degenerative joint disease)   . Fatty liver   . 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 HTN (hypertension)   . Hypercholesteremia   . Mild CAD    a. minimal by cath 2016.  . Nodule of  chest wall   . Normal coronary arteries and LVF 10/23/14 10/24/2014  . Pacemaker implanted 10/25/14- St Jude 10/26/2014  . Paroxysmal atrial fibrillation (HCC)    a. identified on device interrogation, burden low  . Pulmonary hypertension ([place])   . Sick sinus syndrome (HCC)    a. s/p STJ dual chamber PPM   . Type II or unspecified type diabetes mellitus without mention of complication, not stated as uncontrolled   . Vitamin D deficiency     Past Surgical History:  Procedure Laterality Date  . BREAST SURGERY    . CATARACT EXTRACTION    . LEFT AND RIGHT HEART CATHETERIZATION WITH CORONARY ANGIOGRAM N/A 10/23/2014   Procedure: LEFT AND RIGHT HEART CATHETERIZATION WITH CORONARY ANGIOGRAM;  Surgeon: Blane Ohara, MD;  Location: [facility] CATH LAB;  Service: Cardiovascular;  Laterality: N/A;  . PERMANENT PACEMAKER INSERTION N/A 10/25/2014   STJ dual chamber PPM implanted by Dr Rayann Heman for SSS  . TONSILLECTOMY AND ADENOIDECTOMY    . TOTAL KNEE ARTHROPLASTY      Family History  Problem Relation Age of Onset  . Diabetes Mother   . Cancer Mother   . Cancer Father     Social History Reviewed with no changes to be  made today.   Outpatient Medications Prior to Visit  Medication Sig Dispense Refill  . amLODipine (NORVASC) 5 MG tablet Take 1 tablet (5 mg total) by mouth daily. 90 tablet 3  . atorvastatin (LIPITOR) 80 MG tablet Take 1/2 tablet by mouth on Monday and Thursday 30 tablet 3  . bisoprolol (ZEBETA) 10 MG tablet Take 1 tablet daily for BP 90 tablet 1  . calcium carbonate (OS-CAL) 600 MG TABS tablet Take 600 mg by mouth daily with breakfast.    . cetirizine (ZYRTEC ALLERGY) 10 MG tablet Take 10 mg by mouth every other day.     . Cholecalciferol (VITAMIN D) 2000 UNITS CAPS Take 6,000 Units by mouth daily.     . furosemide (LASIX) 40 MG tablet Take 1 tablet (40 mg total) by mouth daily. 90 tablet 3  . glucose blood (FREESTYLE LITE) test strip Check blood sugar 1 time daily-DX-E11.21 100 each 1   . Lancets (FREESTYLE) lancets USE 1 LANCET TO CHECK GLUCOSE ONCE DAILY  1  . Magnesium 500 MG TABS Take 500 mg by mouth 4 (four) times daily.     . metFORMIN (GLUCOPHAGE-XR) 500 MG 24 hr tablet TAKE '1000MG'$  BY MOUTH TWICE DAILY 360 tablet 2  . potassium chloride SA (K-DUR,KLOR-CON) 20 MEQ tablet Take 1 tablet (20 mEq total) by mouth daily. 90 tablet 1  . warfarin (COUMADIN) 5 MG tablet TAKE 1 & 1/2 (ONE & ONE-HALF) TABLETS BY MOUTH ONCE DAILY OR  AS  DIRECTED  BY  COUMADIN  CLINIC 40 tablet 2  . blood glucose meter kit and supplies KIT Dispense based on patient and insurance preference. Check blood sugar 1 time a day-E11.21 1 each 0   No facility-administered medications prior to visit.     Allergies  Allergen Reactions  . Penicillins Anaphylaxis  . Quinapril     Angioedema   . Feldene [Piroxicam] Other (See Comments)    Bleeding   . Calcium-Containing Compounds Other (See Comments)    "calcium deposits on skin"  . Celebrex [Celecoxib] Itching  . Daypro [Oxaprozin] Itching       Objective:    BP 120/74   Pulse 70   Temp (!) 97.2 F (36.2 C) (Temporal)   Resp 17   Ht '4\' 10"'$  (1.473 m)   Wt 185 lb 12.8 oz (84.3 kg)   SpO2 96%   BMI 38.83 kg/m  Wt Readings from Last 3 Encounters:  05/16/19 185 lb 12.8 oz (84.3 kg)  04/19/19 186 lb 9.6 oz (84.6 kg)  03/06/19 185 lb (83.9 kg)    Physical Exam Vitals signs and nursing note reviewed.  Constitutional:      Appearance: She is well-developed.  HENT:     Head: Normocephalic and atraumatic.  Neck:     Musculoskeletal: Normal range of motion.  Cardiovascular:     Rate and Rhythm: Normal rate and regular rhythm.     Heart sounds: Normal heart sounds. No murmur. No friction rub. No gallop.   Pulmonary:     Effort: Pulmonary effort is normal. No tachypnea or respiratory distress.     Breath sounds: Normal breath sounds. No decreased breath sounds, wheezing, rhonchi or rales.  Chest:     Chest wall: No tenderness.   Abdominal:     General: Bowel sounds are normal.     Palpations: Abdomen is soft.  Musculoskeletal: Normal range of motion.  Skin:    General: Skin is warm and dry.  Neurological:     Mental  Status: She is alert and oriented to person, place, and time.     Coordination: Coordination normal.  Psychiatric:        Behavior: Behavior normal. Behavior is cooperative.        Thought Content: Thought content normal.        Judgment: Judgment normal.        Patient has been counseled extensively about nutrition and exercise as well as the importance of adherence with medications and regular follow-up. The patient was given clear instructions to go to ER or return to medical center if symptoms don't improve, worsen or new problems develop. The patient verbalized understanding.   Follow-up: Return in about 3 months (around 08/15/2019) for HTN/HPL/DM.   Gildardo Pounds, FNP-BC [facility] and [facility] [place], [place]   05/16/2019, 10:28 AM

## 2019-05-17 LAB — CMP14+EGFR
ALT: 8 IU/L (ref 0–32)
AST: 12 IU/L (ref 0–40)
Albumin/Globulin Ratio: 2.2 (ref 1.2–2.2)
Albumin: 4.7 g/dL — ABNORMAL HIGH (ref 3.6–4.6)
Alkaline Phosphatase: 53 IU/L (ref 39–117)
BUN/Creatinine Ratio: 25 (ref 12–28)
BUN: 18 mg/dL (ref 8–27)
Bilirubin Total: 0.4 mg/dL (ref 0.0–1.2)
CO2: 25 mmol/L (ref 20–29)
Calcium: 9.6 mg/dL (ref 8.7–10.3)
Chloride: 101 mmol/L (ref 96–106)
Creatinine, Ser: 0.72 mg/dL (ref 0.57–1.00)
GFR calc Af Amer: 91 mL/min/{1.73_m2} (ref >59)
GFR calc non Af Amer: 79 mL/min/{1.73_m2} (ref >59)
Globulin, Total: 2.1 g/dL (ref 1.5–4.5)
Glucose: 115 mg/dL — ABNORMAL HIGH (ref 65–99)
Potassium: 4.1 mmol/L (ref 3.5–5.2)
Sodium: 140 mmol/L (ref 134–144)
Total Protein: 6.8 g/dL (ref 6.0–8.5)

## 2019-05-17 LAB — LIPID PANEL
Chol/HDL Ratio: 2.5 ratio (ref 0.0–4.4)
Cholesterol, Total: 163 mg/dL (ref 100–199)
HDL: 64 mg/dL (ref >39)
LDL Chol Calc (NIH): 84 mg/dL (ref 0–99)
Triglycerides: 80 mg/dL (ref 0–149)
VLDL Cholesterol Cal: 15 mg/dL (ref 5–40)

## 2019-05-17 LAB — VITAMIN D 25 HYDROXY (VIT D DEFICIENCY, FRACTURES): Vit D, 25-Hydroxy: 63.1 ng/mL (ref 30.0–100.0)

## 2019-05-17 LAB — MICROALBUMIN / CREATININE URINE RATIO
Creatinine, Urine: 48.2 mg/dL
Microalb/Creat Ratio: 62 mg/g creat — ABNORMAL HIGH (ref 0–29)
Microalbumin, Urine: 30 ug/mL

## 2019-05-19 ENCOUNTER — Other Ambulatory Visit: Payer: Self-pay | Admitting: Nurse Practitioner

## 2019-05-29 ENCOUNTER — Ambulatory Visit (INDEPENDENT_AMBULATORY_CARE_PROVIDER_SITE_OTHER): Payer: Medicare Other | Admitting: *Deleted

## 2019-05-29 ENCOUNTER — Other Ambulatory Visit: Payer: Self-pay | Admitting: Internal Medicine

## 2019-05-29 DIAGNOSIS — I509 Heart failure, unspecified: Secondary | ICD-10-CM

## 2019-05-29 DIAGNOSIS — I495 Sick sinus syndrome: Secondary | ICD-10-CM

## 2019-05-29 LAB — CUP PACEART REMOTE DEVICE CHECK
Battery Remaining Longevity: 107 mo
Battery Remaining Percentage: 95.5 %
Battery Voltage: 2.99 V
Brady Statistic AP VP Percent: 82 %
Brady Statistic AP VS Percent: 17 %
Brady Statistic AS VP Percent: 1 %
Brady Statistic AS VS Percent: 1 %
Brady Statistic RA Percent Paced: 81 %
Brady Statistic RV Percent Paced: 85 %
Date Time Interrogation Session: 20200929074854
Implantable Lead Implant Date: 20160226
Implantable Lead Implant Date: 20160226
Implantable Lead Location: 753859
Implantable Lead Location: 753860
Implantable Lead Model: 1948
Implantable Pulse Generator Implant Date: 20160226
Lead Channel Impedance Value: 380 Ohm
Lead Channel Impedance Value: 450 Ohm
Lead Channel Pacing Threshold Amplitude: 0.625 V
Lead Channel Pacing Threshold Amplitude: 1 V
Lead Channel Pacing Threshold Pulse Width: 0.4 ms
Lead Channel Pacing Threshold Pulse Width: 0.4 ms
Lead Channel Sensing Intrinsic Amplitude: 3.6 mV
Lead Channel Sensing Intrinsic Amplitude: 8.8 mV
Lead Channel Setting Pacing Amplitude: 1.625
Lead Channel Setting Pacing Amplitude: 2.5 V
Lead Channel Setting Pacing Pulse Width: 0.4 ms
Lead Channel Setting Sensing Sensitivity: 2 mV
Pulse Gen Model: 2240
Pulse Gen Serial Number: 7700661

## 2019-05-31 ENCOUNTER — Ambulatory Visit (INDEPENDENT_AMBULATORY_CARE_PROVIDER_SITE_OTHER): Payer: Medicare Other | Admitting: *Deleted

## 2019-05-31 ENCOUNTER — Encounter (INDEPENDENT_AMBULATORY_CARE_PROVIDER_SITE_OTHER): Payer: Self-pay

## 2019-05-31 ENCOUNTER — Other Ambulatory Visit: Payer: Self-pay

## 2019-05-31 DIAGNOSIS — Z7901 Long term (current) use of anticoagulants: Secondary | ICD-10-CM

## 2019-05-31 DIAGNOSIS — I480 Paroxysmal atrial fibrillation: Secondary | ICD-10-CM | POA: Diagnosis not present

## 2019-05-31 LAB — POCT INR: INR: 3.3 — AB (ref 2.0–3.0)

## 2019-05-31 NOTE — Patient Instructions (Signed)
Description    Hold today, then continue taking 1.5 tablets daily, except 1 tablet on Monday, Wednesday and Friday.  Eating 2 serving of greens per week. Recheck INR 3 weeks. Coumadin Clinic [phone]

## 2019-06-01 ENCOUNTER — Other Ambulatory Visit: Payer: Self-pay

## 2019-06-01 MED ORDER — BISOPROLOL FUMARATE 10 MG PO TABS: 10.0000 mg | ORAL_TABLET | Freq: Every day | ORAL | 1 refills | Status: DC

## 2019-06-07 NOTE — Progress Notes (Signed)
Carelink Summary Report / Loop Recorder 

## 2019-06-12 ENCOUNTER — Other Ambulatory Visit: Payer: Self-pay | Admitting: Family Medicine

## 2019-06-12 ENCOUNTER — Other Ambulatory Visit: Payer: Self-pay | Admitting: Internal Medicine

## 2019-06-12 DIAGNOSIS — Z1231 Encounter for screening mammogram for malignant neoplasm of breast: Secondary | ICD-10-CM

## 2019-06-20 ENCOUNTER — Encounter: Payer: Self-pay | Admitting: Internal Medicine

## 2019-06-21 ENCOUNTER — Other Ambulatory Visit: Payer: Self-pay

## 2019-06-21 ENCOUNTER — Ambulatory Visit (INDEPENDENT_AMBULATORY_CARE_PROVIDER_SITE_OTHER): Payer: Medicare Other | Admitting: *Deleted

## 2019-06-21 DIAGNOSIS — I480 Paroxysmal atrial fibrillation: Secondary | ICD-10-CM

## 2019-06-21 DIAGNOSIS — Z7901 Long term (current) use of anticoagulants: Secondary | ICD-10-CM

## 2019-06-21 LAB — POCT INR: INR: 2.9 (ref 2.0–3.0)

## 2019-06-21 NOTE — Patient Instructions (Signed)
Description   Continue taking 1.5 tablets daily, except 1 tablet on Monday, Wednesday and Friday.  Eating 2 serving of greens per week. Recheck INR 4 weeks. Coumadin Clinic [phone]

## 2019-06-22 ENCOUNTER — Other Ambulatory Visit: Payer: Self-pay | Admitting: Family Medicine

## 2019-06-22 NOTE — Telephone Encounter (Signed)
Requested medication (s) are due for refill today: yes  Requested medication (s) are on the active medication list: yes  Last refill:  03/26/2019  Future visit scheduled: yes  Notes to clinic:  Review for refill   Requested Prescriptions  Pending Prescriptions Disp Refills   metFORMIN (GLUCOPHAGE-XR) 500 MG 24 hr tablet [Pharmacy Med Name: metFORMIN HCl ER 500 MG Oral Tablet Extended Release 24 Hour] 360 tablet 0    Sig: Take 2 tablets by mouth twice daily     There is no refill protocol information for this order

## 2019-06-23 ENCOUNTER — Other Ambulatory Visit: Payer: Self-pay | Admitting: Nurse Practitioner

## 2019-06-23 MED ORDER — METFORMIN HCL 500 MG PO TABS: 1000.0000 mg | ORAL_TABLET | Freq: Two times a day (BID) | ORAL | 0 refills | Status: DC

## 2019-07-02 DIAGNOSIS — Z961 Presence of intraocular lens: Secondary | ICD-10-CM | POA: Diagnosis not present

## 2019-07-02 DIAGNOSIS — E119 Type 2 diabetes mellitus without complications: Secondary | ICD-10-CM | POA: Diagnosis not present

## 2019-07-02 DIAGNOSIS — H40023 Open angle with borderline findings, high risk, bilateral: Secondary | ICD-10-CM | POA: Diagnosis not present

## 2019-07-02 DIAGNOSIS — H04123 Dry eye syndrome of bilateral lacrimal glands: Secondary | ICD-10-CM | POA: Diagnosis not present

## 2019-07-09 ENCOUNTER — Other Ambulatory Visit: Payer: Self-pay | Admitting: Internal Medicine

## 2019-07-16 ENCOUNTER — Ambulatory Visit (INDEPENDENT_AMBULATORY_CARE_PROVIDER_SITE_OTHER): Payer: Medicare Other | Admitting: Podiatry

## 2019-07-16 ENCOUNTER — Encounter: Payer: Self-pay | Admitting: Podiatry

## 2019-07-16 ENCOUNTER — Other Ambulatory Visit: Payer: Self-pay

## 2019-07-16 DIAGNOSIS — B351 Tinea unguium: Secondary | ICD-10-CM

## 2019-07-16 DIAGNOSIS — E1121 Type 2 diabetes mellitus with diabetic nephropathy: Secondary | ICD-10-CM | POA: Diagnosis not present

## 2019-07-16 DIAGNOSIS — M79676 Pain in unspecified toe(s): Secondary | ICD-10-CM

## 2019-07-16 NOTE — Patient Instructions (Signed)
Diabetes Mellitus and Foot Care Foot care is an important part of your health, especially when you have diabetes. Diabetes may cause you to have problems because of poor blood flow (circulation) to your feet and legs, which can cause your skin to:  Become thinner and drier.  Break more easily.  Heal more slowly.  Peel and crack. You may also have nerve damage (neuropathy) in your legs and feet, causing decreased feeling in them. This means that you may not notice minor injuries to your feet that could lead to more serious problems. Noticing and addressing any potential problems early is the best way to prevent future foot problems. How to care for your feet Foot hygiene  Wash your feet daily with warm water and mild soap. Do not use hot water. Then, pat your feet and the areas between your toes until they are completely dry. Do not soak your feet as this can dry your skin.  Trim your toenails straight across. Do not dig under them or around the cuticle. File the edges of your nails with an emery board or nail file.  Apply a moisturizing lotion or petroleum jelly to the skin on your feet and to dry, brittle toenails. Use lotion that does not contain alcohol and is unscented. Do not apply lotion between your toes. Shoes and socks  Wear clean socks or stockings every day. Make sure they are not too tight. Do not wear knee-high stockings since they may decrease blood flow to your legs.  Wear shoes that fit properly and have enough cushioning. Always look in your shoes before you put them on to be sure there are no objects inside.  To break in new shoes, wear them for just a few hours a day. This prevents injuries on your feet. Wounds, scrapes, corns, and calluses  Check your feet daily for blisters, cuts, bruises, sores, and redness. If you cannot see the bottom of your feet, use a mirror or ask someone for help.  Do not cut corns or calluses or try to remove them with medicine.  If you  find a minor scrape, cut, or break in the skin on your feet, keep it and the skin around it clean and dry. You may clean these areas with mild soap and water. Do not clean the area with peroxide, alcohol, or iodine.  If you have a wound, scrape, corn, or callus on your foot, look at it several times a day to make sure it is healing and not infected. Check for: ? Redness, swelling, or pain. ? Fluid or blood. ? Warmth. ? Pus or a bad smell. General instructions  Do not cross your legs. This may decrease blood flow to your feet.  Do not use heating pads or hot water bottles on your feet. They may burn your skin. If you have lost feeling in your feet or legs, you may not know this is happening until it is too late.  Protect your feet from hot and cold by wearing shoes, such as at the beach or on hot pavement.  Schedule a complete foot exam at least once a year (annually) or more often if you have foot problems. If you have foot problems, report any cuts, sores, or bruises to your health care provider immediately. Contact a health care provider if:  You have a medical condition that increases your risk of infection and you have any cuts, sores, or bruises on your feet.  You have an injury that is not   healing.  You have redness on your legs or feet.  You feel burning or tingling in your legs or feet.  You have pain or cramps in your legs and feet.  Your legs or feet are numb.  Your feet always feel cold.  You have pain around a toenail. Get help right away if:  You have a wound, scrape, corn, or callus on your foot and: ? You have pain, swelling, or redness that gets worse. ? You have fluid or blood coming from the wound, scrape, corn, or callus. ? Your wound, scrape, corn, or callus feels warm to the touch. ? You have pus or a bad smell coming from the wound, scrape, corn, or callus. ? You have a fever. ? You have a red line going up your leg. Summary  Check your feet every day  for cuts, sores, red spots, swelling, and blisters.  Moisturize feet and legs daily.  Wear shoes that fit properly and have enough cushioning.  If you have foot problems, report any cuts, sores, or bruises to your health care provider immediately.  Schedule a complete foot exam at least once a year (annually) or more often if you have foot problems. This information is not intended to replace advice given to you by your health care provider. Make sure you discuss any questions you have with your health care provider. Document Released: 08/13/2000 Document Revised: 09/28/2017 Document Reviewed: 09/17/2016 Elsevier Patient Education  2020 Elsevier Inc.  

## 2019-07-19 ENCOUNTER — Other Ambulatory Visit: Payer: Self-pay

## 2019-07-19 ENCOUNTER — Ambulatory Visit (INDEPENDENT_AMBULATORY_CARE_PROVIDER_SITE_OTHER): Payer: Medicare Other | Admitting: *Deleted

## 2019-07-19 DIAGNOSIS — Z7901 Long term (current) use of anticoagulants: Secondary | ICD-10-CM | POA: Diagnosis not present

## 2019-07-19 DIAGNOSIS — I480 Paroxysmal atrial fibrillation: Secondary | ICD-10-CM | POA: Diagnosis not present

## 2019-07-19 LAB — POCT INR: INR: 3.8 — AB (ref 2.0–3.0)

## 2019-07-19 NOTE — Patient Instructions (Signed)
Description   Hold today's dose then continue taking 1.5 tablets daily except 1 tablet on Monday, Wednesday and Friday.  Eating 2 serving of greens per week. Recheck INR 3 weeks. [facility] [phone]

## 2019-07-22 NOTE — Progress Notes (Signed)
Subjective: Tracey Todd is seen today for preventative diabetic foot care follow up. She c/o painful, elongated, thickened toenails 1-5 b/l feet that she cannot cut. Pain interferes with daily activities. Aggravating factor includes wearing enclosed shoe gear and relieved with periodic debridement.  Current Outpatient Medications on File Prior to Visit  Medication Sig  . amLODipine (NORVASC) 5 MG tablet Take 1 tablet (5 mg total) by mouth 2 (two) times daily.  Marland Kitchen atorvastatin (LIPITOR) 80 MG tablet Take 1/2 tablet by mouth on Monday and Thursday  . bisoprolol (ZEBETA) 10 MG tablet Take 1 tablet (10 mg total) by mouth daily. Take 1 tablet daily for BP  . calcium carbonate (OS-CAL) 600 MG TABS tablet Take 600 mg by mouth daily with breakfast.  . cetirizine (ZYRTEC ALLERGY) 10 MG tablet Take 10 mg by mouth every other day.   . Cholecalciferol (VITAMIN D) 2000 UNITS CAPS Take 6,000 Units by mouth daily.   . furosemide (LASIX) 40 MG tablet Take 1 tablet (40 mg total) by mouth daily.  Marland Kitchen glucose blood (FREESTYLE LITE) test strip Check blood sugar 1 time daily-DX-E11.21  . hydrochlorothiazide (HYDRODIURIL) 25 MG tablet Take 25 mg by mouth daily.  . Lancets (FREESTYLE) lancets USE 1 LANCET TO CHECK GLUCOSE ONCE DAILY  . Magnesium 500 MG TABS Take 500 mg by mouth 4 (four) times daily.   . metFORMIN (GLUCOPHAGE) 500 MG tablet Take 2 tablets (1,000 mg total) by mouth 2 (two) times daily with a meal.  . potassium chloride SA (K-DUR,KLOR-CON) 20 MEQ tablet Take 1 tablet (20 mEq total) by mouth daily.  Marland Kitchen warfarin (COUMADIN) 5 MG tablet TAKE AS DIRECTED BY COUMADIN CLINIC   No current facility-administered medications on file prior to visit.      Allergies  Allergen Reactions  . Penicillins Anaphylaxis  . Quinapril     Angioedema   . Feldene [Piroxicam] Other (See Comments)    Bleeding   . Calcium-Containing Compounds Other (See Comments)    "calcium deposits on skin"  . Celebrex [Celecoxib]  Itching  . Daypro [Oxaprozin] Itching     Objective:  Vascular Examination: Capillary refill time immediate x 10 digits.  Dorsalis pedis present b/l.  Posterior tibial pulses present b/l.  Digital hair  present x 10 digits.  Skin temperature gradient WNL b/l.   Dermatological Examination: Skin with normal turgor, texture and tone b/l  Toenails 1-5 b/l discolored, thick, dystrophic with subungual debris and pain with palpation to nailbeds due to thickness of nails.  Musculoskeletal: Muscle strength 5/5 to all LE muscle groups b/l.  No gross bony deformities b/l.  No pain, crepitus or joint limitation noted with ROM.   Neurological Examination: Protective diminished with 10 gram monofilament bilaterally.  Assessment: Painful onychomycosis toenails 1-5 b/l  NIDDM with nephropathy  Plan: 1. Toenails 1-5 b/l were debrided in length and girth without iatrogenic bleeding. 2. Patient to continue soft, supportive shoe gear. 3. Patient to report any pedal injuries to medical professional immediately. 4. Follow up 3 months.  5. Patient/POA to call should there be a concern in the interim.

## 2019-07-30 ENCOUNTER — Other Ambulatory Visit: Payer: Self-pay

## 2019-07-30 ENCOUNTER — Ambulatory Visit
Admission: RE | Admit: 2019-07-30 | Discharge: 2019-07-30 | Disposition: A | Discharge status: Home / Self Care | Payer: Medicare Other | Source: Ambulatory Visit | Attending: Family Medicine | Admitting: Family Medicine

## 2019-07-30 DIAGNOSIS — Z1231 Encounter for screening mammogram for malignant neoplasm of breast: Secondary | ICD-10-CM | POA: Diagnosis not present

## 2019-08-09 ENCOUNTER — Ambulatory Visit (INDEPENDENT_AMBULATORY_CARE_PROVIDER_SITE_OTHER): Payer: Medicare Other | Admitting: *Deleted

## 2019-08-09 ENCOUNTER — Other Ambulatory Visit: Payer: Self-pay

## 2019-08-09 DIAGNOSIS — I480 Paroxysmal atrial fibrillation: Secondary | ICD-10-CM

## 2019-08-09 DIAGNOSIS — Z7901 Long term (current) use of anticoagulants: Secondary | ICD-10-CM | POA: Diagnosis not present

## 2019-08-09 LAB — POCT INR: INR: 3.5 — AB (ref 2.0–3.0)

## 2019-08-09 NOTE — Patient Instructions (Signed)
Description   Hold today's dose then start taking 1 tablet daily except 1.5 tablets on Tuesdays, Thursdays, and Saturdays.  Eating 2 serving of greens per week. Recheck INR 3 weeks. [facility] Clinic [phone]

## 2019-08-14 ENCOUNTER — Telehealth: Payer: Self-pay

## 2019-08-14 NOTE — Telephone Encounter (Signed)

## 2019-08-15 ENCOUNTER — Other Ambulatory Visit: Payer: Self-pay

## 2019-08-15 ENCOUNTER — Ambulatory Visit (INDEPENDENT_AMBULATORY_CARE_PROVIDER_SITE_OTHER): Payer: Medicare Other | Admitting: Nurse Practitioner

## 2019-08-15 VITALS — BP 122/71 | HR 61 | Temp 97.0°F | Resp 18 | Wt 188.6 lb

## 2019-08-15 DIAGNOSIS — I10 Essential (primary) hypertension: Secondary | ICD-10-CM | POA: Diagnosis not present

## 2019-08-15 DIAGNOSIS — E1159 Type 2 diabetes mellitus with other circulatory complications: Secondary | ICD-10-CM

## 2019-08-15 DIAGNOSIS — Z130 Encounter for screening for diseases of the blood and blood-forming organs and certain disorders involving the immune mechanism: Secondary | ICD-10-CM | POA: Diagnosis not present

## 2019-08-15 DIAGNOSIS — E782 Mixed hyperlipidemia: Secondary | ICD-10-CM

## 2019-08-15 LAB — GLUCOSE, POCT (MANUAL RESULT ENTRY): POC Glucose: 136 mg/dl — AB (ref 70–99)

## 2019-08-15 MED ORDER — ONETOUCH DELICA LANCETS 33G MISC: 2 refills | Status: DC

## 2019-08-15 MED ORDER — ONETOUCH VERIO VI STRP: ORAL_STRIP | 2 refills | Status: DC

## 2019-08-15 MED ORDER — ONETOUCH VERIO W/DEVICE KIT: PACK | 0 refills | Status: AC

## 2019-08-15 NOTE — Progress Notes (Signed)
Assessment & Plan:  Tracey Todd was seen today for diabetes, hypertension and hyperlipidemia.  Diagnoses and all orders for this visit:  Type 2 diabetes mellitus with other circulatory complication, unspecified whether long term insulin use (HCC) -     Glucose (CBG) -     Hemoglobin A1c -     Blood Glucose Monitoring Suppl (ONETOUCH VERIO) w/Device KIT; Use to check fasting blood sugar daily. Dx: E11.59 -     glucose blood (ONETOUCH VERIO) test strip; Use to check fasting blood sugar daily. Dx: E11.59 -     OneTouch Delica Lancets 50Y MISC; Use to check fasting blood sugar daily. Dx: E11.59 Continue blood sugar control as discussed in office today, low carbohydrate diet, and regular physical exercise as tolerated, 150 minutes per week (30 min each day, 5 days per week, or 50 min 3 days per week). Keep blood sugar logs with fasting goal of 90-130 mg/dl, post prandial (after you eat) less than 180.  For Hypoglycemia: BS <60 and Hyperglycemia BS >400; contact the clinic ASAP. Annual eye exams and foot exams are recommended.   Essential hypertension -     BMP Continue all antihypertensives as prescribed.  Remember to bring in your blood pressure log with you for your follow up appointment.  DASH/Mediterranean Diets are healthier choices for HTN.    Hyperlipidemia INSTRUCTIONS: Work on a low fat, heart healthy diet and participate in regular aerobic exercise program by working out at least 150 minutes per week; 5 days a week-30 minutes per day. Avoid red meat/beef/steak,  fried foods. junk foods, sodas, sugary drinks, unhealthy snacking, alcohol and smoking.  Drink at least 80 oz of water per day and monitor your carbohydrate intake daily.    Screening for deficiency anemia -     CBC    Patient has been counseled on age-appropriate routine health concerns for screening and prevention. These are reviewed and up-to-date. Referrals have been placed accordingly. Immunizations are up-to-date or  declined.    Subjective:   Chief Complaint  Patient presents with  . Diabetes    readings have been been 108-120. has a form from insurance to get a new glucometer  . Hypertension    BP readings have been good  . Hyperlipidemia   HPI Tracey Todd 84 y.o. female presents to office today for follow up. 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  Seeing [facility] for her chronic cardiac conditions.  DM TYPE 2 Well controlled.  She is medication compliant taking Metformin 1000 mg twice daily.  She denies any hypo or hyperglycemic symptoms.  She is overdue for eye exam and referral was placed today. Lab Results  Component Value Date   HGBA1C 6.8 (H) 02/13/2019   Essential Hypertension Blood pressure is well controlled and she endorses adherence with taking amlodipine 5 mg twice daily, bisoprolol 10 mg daily, Lasix 40 mg daily (CHF), hydrochlorothiazide 25 mg daily. Denies chest pain, shortness of breath, palpitations, lightheadedness, dizziness, headaches or BLE edema.  Her weight has increased about 3 pounds since her last office visit.  She  states she finds it difficult to exercise due to her hip osteoarthritis.  Due to Covid pandemic she is unable to go to the gym and participating in water aerobics. BP Readings from Last 3 Encounters:  08/15/19 122/71  05/16/19 120/74  04/19/19 140/60   HYPERLIPIDEMIA LDL not at goal of less than 70.  She  takes half a tablet twice a week as she was previously experiencing myalgias with taking the full dose every day. No longer experiencing muscle cramps since dosage change. Taking 1/2 tablet every Monday and Thursday. Not consistently diet compliant in regards to low-fat low-cholesterol. Lab Results  Component Value Date   LDLCALC 84 05/16/2019   Review of Systems  Constitutional: Negative for fever, malaise/fatigue and weight loss.  HENT: Negative.  Negative for nosebleeds.   Eyes: Negative.  Negative for blurred vision, double vision and photophobia.  Respiratory: Negative.  Negative for cough and shortness of breath.   Cardiovascular: Negative.  Negative for chest pain, palpitations and leg swelling.  Gastrointestinal: Negative.  Negative for heartburn, nausea and vomiting.  Musculoskeletal: Positive for joint pain. Negative for myalgias.  Neurological: Positive for weakness (Uses cane to assist with mobility). Negative for dizziness, focal weakness, seizures and headaches.  Psychiatric/Behavioral: Negative.  Negative for suicidal ideas.    Past Medical History:  Diagnosis Date  . Atrial flutter ([place])    a. mentioned in 2016 admission.  . Chronic diastolic CHF (congestive heart failure) ([place])   . DJD (degenerative joint disease)   . Fatty liver   . 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 HTN (hypertension)   . Hypercholesteremia   . Mild CAD    a. minimal by cath 2016.  . Nodule of chest wall   . Normal coronary arteries and LVF 10/23/14 10/24/2014  . Pacemaker implanted 10/25/14- St Jude 10/26/2014  . Paroxysmal atrial fibrillation (HCC)    a. identified on device interrogation, burden low  . Pulmonary hypertension ([place])   . Sick sinus syndrome (HCC)    a. s/p STJ dual chamber PPM   . Type II or unspecified type diabetes mellitus without mention of complication, not stated as uncontrolled   . Vitamin D deficiency     Past Surgical History:  Procedure Laterality Date  .  BREAST SURGERY    . CATARACT EXTRACTION    . LEFT AND RIGHT HEART CATHETERIZATION WITH CORONARY ANGIOGRAM N/A 10/23/2014   Procedure: LEFT AND RIGHT HEART CATHETERIZATION WITH CORONARY ANGIOGRAM;  Surgeon: Blane Ohara, MD;  Location[facility] C[facility] LAB;  Service: Cardiovascular;  Laterality: N/A;  . PERMANENT PACEMAKER INSERTION N/A 10/25/2014   STJ dual chamber PPM implanted by Dr Rayann Heman for SSS  . TONSILLECTOMY AND ADENOIDECTOMY    . TOTAL KNEE ARTHROPLASTY      Family History  Problem Relation Age of Onset  . Diabetes Mother   . Cancer Mother   . Cancer Father     Social History Reviewed with no changes to be made today.   Outpatient Medications Prior to Visit  Medication Sig Dispense Refill  . amLODipine (NORVASC) 5 MG tablet Take 1 tablet (5 mg total) by mouth 2 (two) times daily. 180 tablet 0  . atorvastatin (LIPITOR) 80 MG tablet Take 1/2 tablet by mouth on Monday and Thursday 30 tablet 3  . bisoprolol (ZEBETA) 10 MG tablet Take 1 tablet (10 mg total) by mouth daily. Take 1 tablet daily for BP 90 tablet 1  . calcium carbonate (OS-CAL) 600 MG TABS tablet Take 600  mg by mouth daily with breakfast.    . cetirizine (ZYRTEC ALLERGY) 10 MG tablet Take 10 mg by mouth every other day.     . Cholecalciferol (VITAMIN D) 2000 UNITS CAPS Take 6,000 Units by mouth daily.     . furosemide (LASIX) 40 MG tablet Take 1 tablet (40 mg total) by mouth daily. 90 tablet 3  . hydrochlorothiazide (HYDRODIURIL) 25 MG tablet Take 25 mg by mouth daily.    . Magnesium 500 MG TABS Take 500 mg by mouth 4 (four) times daily.     . metFORMIN (GLUCOPHAGE) 500 MG tablet Take 2 tablets (1,000 mg total) by mouth 2 (two) times daily with a meal. 360 tablet 0  . potassium chloride SA (K-DUR,KLOR-CON) 20 MEQ tablet Take 1 tablet (20 mEq total) by mouth daily. 90 tablet 1  . warfarin (COUMADIN) 5 MG tablet TAKE AS DIRECTED BY COUMADIN CLINIC 40 tablet 3  . glucose blood (FREESTYLE LITE) test strip Check blood sugar 1  time daily-DX-E11.21 100 each 1  . Lancets (FREESTYLE) lancets USE 1 LANCET TO CHECK GLUCOSE ONCE DAILY  1   No facility-administered medications prior to visit.    Allergies  Allergen Reactions  . Penicillins Anaphylaxis  . Quinapril     Angioedema   . Feldene [Piroxicam] Other (See Comments)    Bleeding   . Calcium-Containing Compounds Other (See Comments)    "calcium deposits on skin"  . Celebrex [Celecoxib] Itching  . Daypro [Oxaprozin] Itching       Objective:    BP 122/71   Pulse 61   Temp (!) 97 F (36.1 C) (Temporal)   Resp 18   Wt 188 lb 9.6 oz (85.5 kg)   SpO2 94%   BMI 39.42 kg/m  Wt Readings from Last 3 Encounters:  08/15/19 188 lb 9.6 oz (85.5 kg)  05/16/19 185 lb 12.8 oz (84.3 kg)  04/19/19 186 lb 9.6 oz (84.6 kg)    Physical Exam Vitals and nursing note reviewed.  Constitutional:      Appearance: She is well-developed.  HENT:     Head: Normocephalic and atraumatic.  Cardiovascular:     Rate and Rhythm: Normal rate and regular rhythm.     Heart sounds: Normal heart sounds. No murmur. No friction rub. No gallop.   Pulmonary:     Effort: Pulmonary effort is normal. No tachypnea or respiratory distress.     Breath sounds: Normal breath sounds. No decreased breath sounds, wheezing, rhonchi or rales.  Chest:     Chest wall: No tenderness.  Abdominal:     General: Bowel sounds are normal.     Palpations: Abdomen is soft.  Musculoskeletal:        General: No signs of injury.     Cervical back: Normal range of motion.  Skin:    General: Skin is warm and dry.  Neurological:     Mental Status: She is alert and oriented to person, place, and time.     Coordination: Coordination normal.  Psychiatric:        Behavior: Behavior normal. Behavior is cooperative.        Thought Content: Thought content normal.        Judgment: Judgment normal.          Patient has been counseled extensively about nutrition and exercise as well as the importance of  adherence with medications and regular follow-up. The patient was given clear instructions to go to ER or return to medical center  if symptoms don't improve, worsen or new problems develop. The patient verbalized understanding.   Follow-up: Return in about 3 months (around 11/13/2019).   Gildardo Pounds, FNP-BC [facility] and [address], [place]   08/15/2019, 1:09 PM

## 2019-08-16 LAB — CBC
Hematocrit: 35.6 % (ref 34.0–46.6)
Hemoglobin: 11.5 g/dL (ref 11.1–15.9)
MCH: 27.5 pg (ref 26.6–33.0)
MCHC: 32.3 g/dL (ref 31.5–35.7)
MCV: 85 fL (ref 79–97)
Platelets: 323 10*3/uL (ref 150–450)
RBC: 4.18 x10E6/uL (ref 3.77–5.28)
RDW: 14.9 % (ref 11.7–15.4)
WBC: 7.2 10*3/uL (ref 3.4–10.8)

## 2019-08-16 LAB — BASIC METABOLIC PANEL
BUN/Creatinine Ratio: 23 (ref 12–28)
BUN: 17 mg/dL (ref 8–27)
CO2: 24 mmol/L (ref 20–29)
Calcium: 9.7 mg/dL (ref 8.7–10.3)
Chloride: 103 mmol/L (ref 96–106)
Creatinine, Ser: 0.73 mg/dL (ref 0.57–1.00)
GFR calc Af Amer: 89 mL/min/{1.73_m2} (ref >59)
GFR calc non Af Amer: 77 mL/min/{1.73_m2} (ref >59)
Glucose: 110 mg/dL — ABNORMAL HIGH (ref 65–99)
Potassium: 4.8 mmol/L (ref 3.5–5.2)
Sodium: 143 mmol/L (ref 134–144)

## 2019-08-16 LAB — HEMOGLOBIN A1C
Est. average glucose Bld gHb Est-mCnc: 157 mg/dL
Hgb A1c MFr Bld: 7.1 % — ABNORMAL HIGH (ref 4.8–5.6)

## 2019-08-20 NOTE — Progress Notes (Signed)
Patient notified of results & recommendations. Expressed understanding.

## 2019-08-28 ENCOUNTER — Ambulatory Visit (INDEPENDENT_AMBULATORY_CARE_PROVIDER_SITE_OTHER): Payer: Medicare Other | Admitting: *Deleted

## 2019-08-28 ENCOUNTER — Other Ambulatory Visit: Payer: Self-pay

## 2019-08-28 DIAGNOSIS — I480 Paroxysmal atrial fibrillation: Secondary | ICD-10-CM

## 2019-08-28 DIAGNOSIS — I495 Sick sinus syndrome: Secondary | ICD-10-CM

## 2019-08-28 DIAGNOSIS — Z7901 Long term (current) use of anticoagulants: Secondary | ICD-10-CM

## 2019-08-28 LAB — CUP PACEART REMOTE DEVICE CHECK
Battery Remaining Longevity: 107 mo
Battery Remaining Percentage: 95.5 %
Battery Voltage: 2.99 V
Brady Statistic AP VP Percent: 84 %
Brady Statistic AP VS Percent: 15 %
Brady Statistic AS VP Percent: 1 %
Brady Statistic AS VS Percent: 1 %
Brady Statistic RA Percent Paced: 86 %
Brady Statistic RV Percent Paced: 86 %
Date Time Interrogation Session: 20201229020015
Implantable Lead Implant Date: 20160226
Implantable Lead Implant Date: 20160226
Implantable Lead Location: 753859
Implantable Lead Location: 753860
Implantable Lead Model: 1948
Implantable Pulse Generator Implant Date: 20160226
Lead Channel Impedance Value: 360 Ohm
Lead Channel Impedance Value: 460 Ohm
Lead Channel Pacing Threshold Amplitude: 0.5 V
Lead Channel Pacing Threshold Amplitude: 1 V
Lead Channel Pacing Threshold Pulse Width: 0.4 ms
Lead Channel Pacing Threshold Pulse Width: 0.4 ms
Lead Channel Sensing Intrinsic Amplitude: 3.6 mV
Lead Channel Sensing Intrinsic Amplitude: 7.4 mV
Lead Channel Setting Pacing Amplitude: 1.5 V
Lead Channel Setting Pacing Amplitude: 2.5 V
Lead Channel Setting Pacing Pulse Width: 0.4 ms
Lead Channel Setting Sensing Sensitivity: 2 mV
Pulse Gen Model: 2240
Pulse Gen Serial Number: 7700661

## 2019-08-28 LAB — POCT INR: INR: 3.6 — AB (ref 2.0–3.0)

## 2019-08-28 NOTE — Patient Instructions (Signed)
Description   Hold today's dose then start taking the dose you should be taking which is 1 tablet daily except 1.5 tablets on Tuesdays, Thursdays, and Saturdays. Eating 2 serving of greens per week. Recheck INR 3 weeks. Coumadin Clinic [phone]

## 2019-09-18 ENCOUNTER — Ambulatory Visit (INDEPENDENT_AMBULATORY_CARE_PROVIDER_SITE_OTHER): Payer: Medicare Other | Admitting: *Deleted

## 2019-09-18 ENCOUNTER — Other Ambulatory Visit: Payer: Self-pay

## 2019-09-18 ENCOUNTER — Other Ambulatory Visit: Payer: Self-pay | Admitting: Internal Medicine

## 2019-09-18 DIAGNOSIS — I480 Paroxysmal atrial fibrillation: Secondary | ICD-10-CM | POA: Diagnosis not present

## 2019-09-18 DIAGNOSIS — Z7901 Long term (current) use of anticoagulants: Secondary | ICD-10-CM | POA: Diagnosis not present

## 2019-09-18 LAB — POCT INR: INR: 2.6 (ref 2.0–3.0)

## 2019-09-18 NOTE — Patient Instructions (Signed)
Description   Continue taking 1 tablet daily except 1.5 tablets on Tuesdays, Thursdays, and Saturdays. Eating 2 serving of greens per week. Recheck INR 4 weeks. Coumadin Clinic [phone]

## 2019-09-28 ENCOUNTER — Other Ambulatory Visit: Payer: Self-pay | Admitting: Nurse Practitioner

## 2019-10-12 ENCOUNTER — Other Ambulatory Visit: Payer: Self-pay

## 2019-10-12 ENCOUNTER — Ambulatory Visit (INDEPENDENT_AMBULATORY_CARE_PROVIDER_SITE_OTHER): Payer: Medicare Other | Admitting: Podiatry

## 2019-10-12 ENCOUNTER — Encounter: Payer: Self-pay | Admitting: Podiatry

## 2019-10-12 DIAGNOSIS — M79676 Pain in unspecified toe(s): Secondary | ICD-10-CM

## 2019-10-12 DIAGNOSIS — B351 Tinea unguium: Secondary | ICD-10-CM

## 2019-10-12 DIAGNOSIS — E1121 Type 2 diabetes mellitus with diabetic nephropathy: Secondary | ICD-10-CM | POA: Diagnosis not present

## 2019-10-12 DIAGNOSIS — D689 Coagulation defect, unspecified: Secondary | ICD-10-CM

## 2019-10-12 DIAGNOSIS — L84 Corns and callosities: Secondary | ICD-10-CM | POA: Insufficient documentation

## 2019-10-12 DIAGNOSIS — M129 Arthropathy, unspecified: Secondary | ICD-10-CM | POA: Insufficient documentation

## 2019-10-12 NOTE — Progress Notes (Signed)
Patient ID: Tracey Todd, female   DOB: 06-10-1935, 85 y.o.   MRN: [id number] Complaint:  Visit Type: Patient returns to my office for continued preventative foot care services. Complaint: Patient states" my nails have grown long and thick and become painful to walk and wear shoes" Patient has been diagnosed with DM with no foot complications.  Patient is also concerned about painful callus on left big toe. The patient presents for preventative foot care services. No changes to ROS.  Patient is now taking coumadin. Patient requests no dremel tool usage.  Podiatric Exam: Vascular: dorsalis pedis and posterior tibial pulses are palpable bilateral. Capillary return is immediate. Temperature gradient is WNL. Skin turgor WNL  Sensorium: Diminished  Semmes Weinstein monofilament test. Normal tactile sensation bilaterally. Nail Exam: Pt has thick disfigured discolored nails with subungual debris noted bilateral entire nail hallux through fifth toenails Ulcer Exam: There is no evidence of ulcer or pre-ulcerative changes or infection. Orthopedic Exam: Muscle tone and strength are WNL. No limitations in general ROM. No crepitus or effusions noted. Foot type and digits show no abnormalities. Mild dorsomedial exostosis noted. Skin: No Porokeratosis. No infection or ulcers.  Pinch callus hallux  Diagnosis:  Onychomycosis, , Pain in right toe, pain in left toes  Treatment & Plan Procedures and Treatment: Consent by patient was obtained for treatment procedures. The patient understood the discussion of treatment and procedures well. All questions were answered thoroughly reviewed. Debridement of mycotic and hypertrophic toenails, 1 through 5 bilateral and clearing of subungual debris. No ulceration, no infection noted. Minimal mechanical debridement requested.  . Return Visit-Office Procedure: Patient instructed to return to the office for a follow up visit 3 months for continued evaluation and  treatment.    Helane Gunther DPM

## 2019-10-16 ENCOUNTER — Other Ambulatory Visit: Payer: Self-pay

## 2019-10-16 ENCOUNTER — Ambulatory Visit (INDEPENDENT_AMBULATORY_CARE_PROVIDER_SITE_OTHER): Payer: Medicare Other | Admitting: Pharmacist

## 2019-10-16 DIAGNOSIS — Z7901 Long term (current) use of anticoagulants: Secondary | ICD-10-CM | POA: Diagnosis not present

## 2019-10-16 DIAGNOSIS — I480 Paroxysmal atrial fibrillation: Secondary | ICD-10-CM

## 2019-10-16 LAB — POCT INR: INR: 2.4 (ref 2.0–3.0)

## 2019-10-16 NOTE — Patient Instructions (Signed)
Description   Continue taking 1 tablet daily except 1.5 tablets on Tuesdays, Thursdays, and Saturdays. Eating 2 serving of greens per week. Recheck INR 5 weeks. Coumadin Clinic [phone]

## 2019-10-22 ENCOUNTER — Other Ambulatory Visit: Payer: Self-pay | Admitting: Family Medicine

## 2019-10-24 ENCOUNTER — Telehealth: Payer: Self-pay | Admitting: Internal Medicine

## 2019-10-24 NOTE — Telephone Encounter (Signed)
We are recommending the COVID-19 vaccine to all of our patients. Cardiac medications (including blood thinners) should not deter anyone from being vaccinated and there is no need to hold any of those medications prior to vaccine administration.     Currently, there is a hotline to call (active 09/07/19) to schedule vaccination appointments as no walk-ins will be accepted.   Number: [phone].    If an appointment is not available please go to [email or web address] to sign up for notification when additional vaccine appointments are available.   If you have further questions or concerns about the vaccine process, please visit [email or web address] or contact your primary care physician.   

## 2019-10-25 ENCOUNTER — Telehealth: Payer: Self-pay | Admitting: Family Medicine

## 2019-10-25 MED ORDER — POTASSIUM CHLORIDE CRYS ER 20 MEQ PO TBCR: 20.0000 meq | EXTENDED_RELEASE_TABLET | Freq: Every day | ORAL | 0 refills | Status: DC

## 2019-10-25 NOTE — Telephone Encounter (Signed)
Rx sent 

## 2019-10-25 NOTE — Telephone Encounter (Signed)
1) Medication(s) Requested (by name): Potassium Chloride Crys ER 20 MEQ Take 1 tablet by mouth once daily   2) [facility]acy of [facility]: [facility]  3) Special Requests:   Approved medications will be sent to the pharmacy, we will reach out if there is an issue.  Requests made after 3pm may not be addressed until the following business day!  If a patient is unsure of the name of the medication(s) please note and ask patient to call back when they are able to provide all info, do not send to responsible party until all information is available!

## 2019-10-26 ENCOUNTER — Ambulatory Visit: Payer: Medicare Other | Attending: Internal Medicine

## 2019-10-26 DIAGNOSIS — Z23 Encounter for immunization: Secondary | ICD-10-CM

## 2019-10-26 NOTE — Progress Notes (Signed)
   Covid-19 Vaccination Clinic  Name:  Tracey Todd    MRN: [id number] DOB: 06/19/1935  10/26/2019  [name] was observed post Covid-19 immunization for 15 minutes without incidence. She was provided with Vaccine Information Sheet and instruction to access the V-Safe system.   [name] was instructed to call 911 with any severe reactions post vaccine: Marland Kitchen Difficulty breathing  . Swelling of your face and throat  . A fast heartbeat  . A bad rash all over your body  . Dizziness and weakness    Immunizations Administered    Name Date Dose VIS Date Route   Pfizer COVID-19 Vaccine 10/26/2019  8:45 AM 0.3 mL 08/10/2019 Intramuscular   Manufacturer: [employer], [employer]   Lot: [id number]   NDC: [address]-3

## 2019-11-13 ENCOUNTER — Telehealth: Payer: Self-pay

## 2019-11-13 NOTE — Telephone Encounter (Signed)

## 2019-11-14 ENCOUNTER — Ambulatory Visit (INDEPENDENT_AMBULATORY_CARE_PROVIDER_SITE_OTHER): Payer: Medicare Other | Admitting: Nurse Practitioner

## 2019-11-14 ENCOUNTER — Other Ambulatory Visit: Payer: Self-pay

## 2019-11-14 VITALS — BP 127/77 | HR 60 | Temp 97.2°F | Resp 18 | Wt 188.0 lb

## 2019-11-14 DIAGNOSIS — E1169 Type 2 diabetes mellitus with other specified complication: Secondary | ICD-10-CM

## 2019-11-14 DIAGNOSIS — I10 Essential (primary) hypertension: Secondary | ICD-10-CM

## 2019-11-14 DIAGNOSIS — E1159 Type 2 diabetes mellitus with other circulatory complications: Secondary | ICD-10-CM | POA: Diagnosis not present

## 2019-11-14 DIAGNOSIS — E785 Hyperlipidemia, unspecified: Secondary | ICD-10-CM

## 2019-11-14 LAB — GLUCOSE, POCT (MANUAL RESULT ENTRY): POC Glucose: 111 mg/dl — AB (ref 70–99)

## 2019-11-14 LAB — POCT GLYCOSYLATED HEMOGLOBIN (HGB A1C): Hemoglobin A1C: 6.6 % — AB (ref 4.0–5.6)

## 2019-11-14 MED ORDER — AMLODIPINE BESYLATE 5 MG PO TABS: 5.0000 mg | ORAL_TABLET | Freq: Every day | ORAL | 0 refills | Status: DC

## 2019-11-14 MED ORDER — BISOPROLOL FUMARATE 10 MG PO TABS: 10.0000 mg | ORAL_TABLET | Freq: Every day | ORAL | 1 refills | Status: DC

## 2019-11-14 NOTE — Progress Notes (Signed)
Assessment & Plan:  Tracey Todd was seen today for diabetes, hypertension and hyperlipidemia.  Diagnoses and all orders for this visit:  Type 2 diabetes mellitus with other circulatory complication, unspecified whether long term insulin use (HCC) -     HgB A1c -     Glucose (CBG) Continue blood sugar control as discussed in office today, low carbohydrate diet, and regular physical exercise as tolerated, 150 minutes per week (30 min each day, 5 days per week, or 50 min 3 days per week). Keep blood sugar logs with fasting goal of 90-130 mg/dl, post prandial (after you eat) less than 180.  For Hypoglycemia: BS <60 and Hyperglycemia BS >400; contact the clinic ASAP. Annual eye exams and foot exams are recommended.   Essential hypertension -     bisoprolol (ZEBETA) 10 MG tablet; Take 1 tablet (10 mg total) by mouth daily. Take 1 tablet daily for BP -     CMP14+EGFR -     amLODipine (NORVASC) 5 MG tablet; Take 1 tablet (5 mg total) by mouth daily. Continue all antihypertensives as prescribed.  Remember to bring in your blood pressure log with you for your follow up appointment.  DASH/Mediterranean Diets are healthier choices for HTN.   Hyperlipidemia associated with type 2 diabetes mellitus ([place]) -     Lipid panel INSTRUCTIONS: Work on a low fat, heart healthy diet and participate in regular aerobic exercise program by working out at least 150 minutes per week; 5 days a week-30 minutes per day. Avoid red meat/beef/steak,  fried foods. junk foods, sodas, sugary drinks, unhealthy snacking, alcohol and smoking.  Drink at least 80 oz of water per day and monitor your carbohydrate intake daily.    Patient has been counseled on age-appropriate routine health concerns for screening and prevention. These are reviewed and up-to-date. Referrals have been placed accordingly. Immunizations are up-to-date or declined.    Subjective:   Chief Complaint  Patient presents with  . Diabetes    AM readings are  between 110-125  . Hypertension    BP readings are still running good(120s/60s-70s)  . Hyperlipidemia   HPI Tracey Todd 85 y.o. female presents to office today for follow up.   Reports falling in her kitchen last month. She was walking from the living room into her ktichen and her foot slipped outside of her shoes. She did not hit her head or lose consciousness.  Today she reports some soreness in the left hip area with no worsening of gait.    Essential Hypertension  Well-controlled.  She endorses medication compliance taking amlodipine 5 mg daily as well as bisoprolol 10 mg daily. Denies chest pain, shortness of breath, palpitations, lightheadedness, dizziness, headaches or visual disturbances. BP Readings from Last 3 Encounters:  11/14/19 127/77  08/15/19 122/71  05/16/19 120/74    DM TYPE 2 Well controlled and improved since last office visit. She has made a few changes in her diet and reports eating less white bread.  Currently taking Metformin 500 mg 2 tablets twice daily.  Taking gabapentin for hyperglycemic symptoms of peripheral neuropathy. Lab Results  Component Value Date   HGBA1C 6.6 (A) 11/14/2019     Dyslipidemia LDL not at goal of <70. She takes atorvastatin 80 mg daily as prescribed.  Denies any statin intolerance. Lab Results  Component Value Date   LDLCALC 84 05/16/2019   Review of Systems  Constitutional: Negative for fever, malaise/fatigue and weight loss.  HENT: Negative.  Negative for nosebleeds.  Eyes: Negative.  Negative for blurred vision, double vision and photophobia.  Respiratory: Negative.  Negative for cough and shortness of breath.   Cardiovascular: Negative.  Negative for chest pain, palpitations and leg swelling.  Gastrointestinal: Negative.  Negative for heartburn, nausea and vomiting.  Musculoskeletal: Positive for joint pain (Bilateral knees). Negative for myalgias.  Neurological: Negative.  Negative for dizziness, focal weakness,  seizures and headaches.  Psychiatric/Behavioral: Negative.  Negative for suicidal ideas.    Past Medical History:  Diagnosis Date  . Atrial flutter ([place])    a. mentioned in 2016 admission.  . Chronic diastolic CHF (congestive heart failure) ([place])   . DJD (degenerative joint disease)   . Fatty liver   . 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 HTN (hypertension)   . Hypercholesteremia   . Mild CAD    a. minimal by cath 2016.  . Nodule of chest wall   . Normal coronary arteries and LVF 10/23/14 10/24/2014  . Pacemaker implanted 10/25/14- [employer] 10/26/2014  . Paroxysmal atrial fibrillation (HCC)    a. identified on device interrogation, burden low  . Pulmonary hypertension ([place])   . Sick sinus syndrome (HCC)    a. s/p STJ dual chamber PPM   . Type II or unspecified type diabetes mellitus without mention of complication, not stated as uncontrolled   . Vitamin D deficiency     Past Surgical History:  Procedure Laterality Date  . BREAST SURGERY    . CATARACT EXTRACTION    . LEFT AND RIGHT HEART CATHETERIZATION WITH CORONARY ANGIOGRAM N/A 10/23/2014   Procedure: LEFT AND RIGHT HEART CATHETERIZATION WITH CORONARY ANGIOGRAM;  Surgeon: Blane Ohara, MD;  Location: [facility] CATH LAB;  Service: Cardiovascular;  Laterality: N/A;  . PERMANENT PACEMAKER INSERTION N/A 10/25/2014   STJ dual chamber PPM implanted by Dr Rayann Heman for SSS  . TONSILLECTOMY AND ADENOIDECTOMY    . TOTAL KNEE ARTHROPLASTY      Family History  Problem Relation Age of Onset  . Diabetes Mother   . Cancer Mother   . Cancer Father     Social History Reviewed with no changes to be made today.   Outpatient Medications Prior to Visit  Medication Sig Dispense Refill  . atorvastatin (LIPITOR) 80 MG tablet Take 1/2 tablet by mouth on Monday and Thursday 30 tablet 3  . Blood Glucose Monitoring Suppl (ONETOUCH VERIO) w/Device KIT Use to check fasting blood sugar daily. Dx: E11.59 1 kit 0  . calcium carbonate (OS-CAL) 600 MG  TABS tablet Take 600 mg by mouth daily with breakfast.    . cetirizine (ZYRTEC ALLERGY) 10 MG tablet Take 10 mg by mouth every other day.     . Cholecalciferol (VITAMIN D) 2000 UNITS CAPS Take 6,000 Units by mouth daily.     . furosemide (LASIX) 40 MG tablet Take 1 tablet by mouth once daily 90 tablet 3  . glucose blood (ONETOUCH VERIO) test strip Use to check fasting blood sugar daily. Dx: E11.59 100 each 2  . Magnesium 500 MG TABS Take 500 mg by mouth 4 (four) times daily.     . metFORMIN (GLUCOPHAGE) 500 MG tablet TAKE 2 TABLETS BY MOUTH TWICE DAILY WITH A MEAL 360 tablet 0  . OneTouch Delica Lancets 62V MISC Use to check fasting blood sugar daily. Dx: E11.59 100 each 2  . potassium chloride SA (KLOR-CON) 20 MEQ tablet Take 1 tablet (20 mEq total) by mouth daily. 90 tablet 0  . warfarin (COUMADIN) 5 MG tablet  TAKE AS DIRECTED BY COUMADIN CLINIC 40 tablet 3  . amLODipine (NORVASC) 5 MG tablet Take 1 tablet (5 mg total) by mouth 2 (two) times daily. 180 tablet 0  . bisoprolol (ZEBETA) 10 MG tablet Take 1 tablet (10 mg total) by mouth daily. Take 1 tablet daily for BP 90 tablet 1  . hydrochlorothiazide (HYDRODIURIL) 25 MG tablet Take 25 mg by mouth daily.     No facility-administered medications prior to visit.    Allergies  Allergen Reactions  . Penicillins Anaphylaxis  . Quinapril     Angioedema   . Feldene [Piroxicam] Other (See Comments)    Bleeding   . Calcium-Containing Compounds Other (See Comments)    "calcium deposits on skin"  . Celebrex [Celecoxib] Itching  . Daypro [Oxaprozin] Itching       Objective:    BP 127/77   Pulse 60   Temp (!) 97.2 F (36.2 C) (Temporal)   Resp 18   Wt 188 lb (85.3 kg)   SpO2 94%   BMI 39.29 kg/m  Wt Readings from Last 3 Encounters:  11/14/19 188 lb (85.3 kg)  08/15/19 188 lb 9.6 oz (85.5 kg)  05/16/19 185 lb 12.8 oz (84.3 kg)    Physical Exam Vitals and nursing note reviewed.  Constitutional:      Appearance: She is  well-developed.  HENT:     Head: Normocephalic and atraumatic.  Cardiovascular:     Rate and Rhythm: Normal rate and regular rhythm.     Heart sounds: Normal heart sounds. No murmur. No friction rub. No gallop.   Pulmonary:     Effort: Pulmonary effort is normal. No tachypnea or respiratory distress.     Breath sounds: Normal breath sounds. No decreased breath sounds, wheezing, rhonchi or rales.  Chest:     Chest wall: No tenderness.  Abdominal:     General: Bowel sounds are normal.     Palpations: Abdomen is soft.  Musculoskeletal:        General: Normal range of motion.     Cervical back: Normal range of motion.  Skin:    General: Skin is warm and dry.  Neurological:     Mental Status: She is alert and oriented to person, place, and time.     Coordination: Coordination normal.     Gait: Gait abnormal (uses a straight cane; B/L KNEE OA. DECLINES REFERRAL TO SPECIALIST).  Psychiatric:        Behavior: Behavior normal. Behavior is cooperative.        Thought Content: Thought content normal.        Judgment: Judgment normal.          Patient has been counseled extensively about nutrition and exercise as well as the importance of adherence with medications and regular follow-up. The patient was given clear instructions to go to ER or return to medical center if symptoms don't improve, worsen or new problems develop. The patient verbalized understanding.   Follow-up: Return in about 3 months (around 02/14/2020).   Gildardo Pounds, FNP-BC [facility] [place], [place]   11/14/2019, 4:13 PM

## 2019-11-15 LAB — CMP14+EGFR
ALT: 7 IU/L (ref 0–32)
AST: 16 IU/L (ref 0–40)
Albumin/Globulin Ratio: 1.9 (ref 1.2–2.2)
Albumin: 4.4 g/dL (ref 3.6–4.6)
Alkaline Phosphatase: 55 IU/L (ref 39–117)
BUN/Creatinine Ratio: 29 — ABNORMAL HIGH (ref 12–28)
BUN: 20 mg/dL (ref 8–27)
Bilirubin Total: 0.3 mg/dL (ref 0.0–1.2)
CO2: 23 mmol/L (ref 20–29)
Calcium: 9.7 mg/dL (ref 8.7–10.3)
Chloride: 101 mmol/L (ref 96–106)
Creatinine, Ser: 0.69 mg/dL (ref 0.57–1.00)
GFR calc Af Amer: 94 mL/min/{1.73_m2} (ref >59)
GFR calc non Af Amer: 81 mL/min/{1.73_m2} (ref >59)
Globulin, Total: 2.3 g/dL (ref 1.5–4.5)
Glucose: 101 mg/dL — ABNORMAL HIGH (ref 65–99)
Potassium: 4.2 mmol/L (ref 3.5–5.2)
Sodium: 140 mmol/L (ref 134–144)
Total Protein: 6.7 g/dL (ref 6.0–8.5)

## 2019-11-15 LAB — LIPID PANEL
Chol/HDL Ratio: 2.9 ratio (ref 0.0–4.4)
Cholesterol, Total: 169 mg/dL (ref 100–199)
HDL: 59 mg/dL (ref >39)
LDL Chol Calc (NIH): 83 mg/dL (ref 0–99)
Triglycerides: 160 mg/dL — ABNORMAL HIGH (ref 0–149)
VLDL Cholesterol Cal: 27 mg/dL (ref 5–40)

## 2019-11-15 NOTE — Progress Notes (Signed)
Patient notified of results & recommendations. Expressed understanding.

## 2019-11-19 ENCOUNTER — Other Ambulatory Visit: Payer: Self-pay | Admitting: Family Medicine

## 2019-11-20 ENCOUNTER — Ambulatory Visit (INDEPENDENT_AMBULATORY_CARE_PROVIDER_SITE_OTHER): Payer: Medicare Other | Admitting: *Deleted

## 2019-11-20 ENCOUNTER — Ambulatory Visit: Payer: Medicare Other | Attending: Internal Medicine

## 2019-11-20 ENCOUNTER — Other Ambulatory Visit: Payer: Self-pay

## 2019-11-20 DIAGNOSIS — Z23 Encounter for immunization: Secondary | ICD-10-CM

## 2019-11-20 DIAGNOSIS — I480 Paroxysmal atrial fibrillation: Secondary | ICD-10-CM

## 2019-11-20 DIAGNOSIS — Z5181 Encounter for therapeutic drug level monitoring: Secondary | ICD-10-CM

## 2019-11-20 DIAGNOSIS — Z7901 Long term (current) use of anticoagulants: Secondary | ICD-10-CM | POA: Diagnosis not present

## 2019-11-20 LAB — POCT INR: INR: 2.6 (ref 2.0–3.0)

## 2019-11-20 NOTE — Progress Notes (Signed)
   Covid-19 Vaccination Clinic  Name:  Tracey Todd    MRN: [id number] DOB: 1935-01-21  11/20/2019  Tracey Todd was observed post Covid-19 immunization for 15 minutes without incident. She was provided with Vaccine Information Sheet and instruction to access the V-Safe system.   Tracey Todd was instructed to call 911 with any severe reactions post vaccine: Marland Kitchen Difficulty breathing  . Swelling of face and throat  . A fast heartbeat  . A bad rash all over body  . Dizziness and weakness   Immunizations Administered    Name Date Dose VIS Date Route   Pfizer COVID-19 Vaccine 11/20/2019 10:13 AM 0.3 mL 08/10/2019 Intramuscular   Manufacturer: [employer], [employer]   Lot: [phone] [id number]   NDC: [address]-7

## 2019-11-20 NOTE — Patient Instructions (Signed)
Description   Continue taking 1 tablet daily except 1.5 tablets on Tuesdays, Thursdays, and Saturdays. Eating 2 serving of greens per week. Recheck INR 6 weeks. Coumadin Clinic [phone]

## 2019-11-27 ENCOUNTER — Ambulatory Visit (INDEPENDENT_AMBULATORY_CARE_PROVIDER_SITE_OTHER): Payer: Medicare Other | Admitting: *Deleted

## 2019-11-27 DIAGNOSIS — I495 Sick sinus syndrome: Secondary | ICD-10-CM

## 2019-11-27 LAB — CUP PACEART REMOTE DEVICE CHECK
Battery Remaining Longevity: 109 mo
Battery Remaining Percentage: 95.5 %
Battery Voltage: 2.98 V
Brady Statistic AP VP Percent: 86 %
Brady Statistic AP VS Percent: 13 %
Brady Statistic AS VP Percent: 1 %
Brady Statistic AS VS Percent: 1 %
Brady Statistic RA Percent Paced: 89 %
Brady Statistic RV Percent Paced: 88 %
Date Time Interrogation Session: 20210330020027
Implantable Lead Implant Date: 20160226
Implantable Lead Implant Date: 20160226
Implantable Lead Location: 753859
Implantable Lead Location: 753860
Implantable Lead Model: 1948
Implantable Pulse Generator Implant Date: 20160226
Lead Channel Impedance Value: 390 Ohm
Lead Channel Impedance Value: 480 Ohm
Lead Channel Pacing Threshold Amplitude: 0.5 V
Lead Channel Pacing Threshold Amplitude: 1 V
Lead Channel Pacing Threshold Pulse Width: 0.4 ms
Lead Channel Pacing Threshold Pulse Width: 0.4 ms
Lead Channel Sensing Intrinsic Amplitude: 2.8 mV
Lead Channel Sensing Intrinsic Amplitude: 7.9 mV
Lead Channel Setting Pacing Amplitude: 1.5 V
Lead Channel Setting Pacing Amplitude: 2.5 V
Lead Channel Setting Pacing Pulse Width: 0.4 ms
Lead Channel Setting Sensing Sensitivity: 2 mV
Pulse Gen Model: 2240
Pulse Gen Serial Number: 7700661

## 2019-11-27 NOTE — Progress Notes (Signed)
PPM Remote  

## 2019-12-17 ENCOUNTER — Other Ambulatory Visit: Payer: Self-pay | Admitting: Internal Medicine

## 2020-01-01 ENCOUNTER — Ambulatory Visit (INDEPENDENT_AMBULATORY_CARE_PROVIDER_SITE_OTHER): Payer: Medicare Other | Admitting: *Deleted

## 2020-01-01 ENCOUNTER — Other Ambulatory Visit: Payer: Self-pay

## 2020-01-01 DIAGNOSIS — H1045 Other chronic allergic conjunctivitis: Secondary | ICD-10-CM | POA: Diagnosis not present

## 2020-01-01 DIAGNOSIS — Z7901 Long term (current) use of anticoagulants: Secondary | ICD-10-CM

## 2020-01-01 DIAGNOSIS — H04123 Dry eye syndrome of bilateral lacrimal glands: Secondary | ICD-10-CM | POA: Diagnosis not present

## 2020-01-01 DIAGNOSIS — I480 Paroxysmal atrial fibrillation: Secondary | ICD-10-CM | POA: Diagnosis not present

## 2020-01-01 DIAGNOSIS — H40023 Open angle with borderline findings, high risk, bilateral: Secondary | ICD-10-CM | POA: Diagnosis not present

## 2020-01-01 LAB — POCT INR: INR: 2.6 (ref 2.0–3.0)

## 2020-01-01 NOTE — Patient Instructions (Signed)
Description   Continue taking 1 tablet daily except 1.5 tablets on Tuesdays, Thursdays, and Saturdays. Eating 2 serving of greens per week. Recheck INR 6 weeks. Coumadin Clinic [phone]

## 2020-01-08 ENCOUNTER — Telehealth: Payer: Self-pay

## 2020-01-08 NOTE — Telephone Encounter (Signed)

## 2020-01-09 ENCOUNTER — Other Ambulatory Visit: Payer: Self-pay

## 2020-01-09 ENCOUNTER — Ambulatory Visit (INDEPENDENT_AMBULATORY_CARE_PROVIDER_SITE_OTHER): Payer: Medicare Other | Admitting: Internal Medicine

## 2020-01-09 ENCOUNTER — Encounter: Payer: Self-pay | Admitting: Internal Medicine

## 2020-01-09 VITALS — BP 145/69 | HR 60 | Temp 97.2°F | Resp 18 | Wt 190.0 lb

## 2020-01-09 DIAGNOSIS — H6121 Impacted cerumen, right ear: Secondary | ICD-10-CM

## 2020-01-09 DIAGNOSIS — H9201 Otalgia, right ear: Secondary | ICD-10-CM

## 2020-01-09 NOTE — Progress Notes (Signed)
  Subjective:    Tracey Todd - 85 y.o. female MRN [id number]  Date of birth: 07-25-1935  HPI  Tracey Todd is here for concern about right ear pain. Reports it feels stopped up and has been that way for about 3 weeks. She does have a history of allergies and takes Zyrtec. No drainage from ear. No hearing loss. No fevers.      Health Maintenance:  Health Maintenance Due  Topic Date Due  . OPHTHALMOLOGY EXAM  06/27/2019    -  reports that she quit smoking about 56 years ago. She has never used smokeless tobacco. - Review of Systems: Per HPI. - Past Medical History: Patient Active Problem List   Diagnosis Date Noted  . Chronic arthropathy 10/12/2019  . Callus 10/12/2019  . Long term (current) use of anticoagulants 10/02/2018  . Atherosclerosis of aorta (HCC) 07/20/2017  . Diabetic neuropathy (HCC) 07/20/2017  . Moderate to severe pulmonary hypertension (HCC) 11/20/2015  . BMI 34.0-34.9,adult 08/06/2015  . Diabetes mellitus type 2, controlled (HCC) 04/25/2015  . Sick sinus syndrome (HCC) 01/29/2015  . Essential hypertension 10/22/2014  . PAF-fib and flutter 10/22/2014  . Congestive heart failure (HCC) 10/21/2014  . 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 Obesity 07/08/2014  . Vitamin D deficiency 09/28/2013  . Medication management 09/28/2013  . Hyperlipidemia   . DJD (degenerative joint disease)   . Fatty liver    - Medications: reviewed and updated   Objective:   Physical Exam There were no vitals taken for this visit. Physical Exam  Constitutional: She is oriented to person, place, and time and well-developed, well-nourished, and in no distress. No distress.  HENT:  Right ear with impacted cerumen, unable to visualize TM. No abnormalities of walls of right ear canal. No pain with traction on tragus. Left ear with normal canal and TM.   Cardiovascular: Normal rate.  Pulmonary/Chest: Effort normal. No respiratory distress.  Musculoskeletal:        General: Normal range of motion.  Neurological: She is alert and oriented to person, place, and time.  Skin: Skin is warm and dry. She is not diaphoretic.  Psychiatric: Affect and judgment normal.        Assessment & Plan:   1. Ear pain, right 2. Impacted cerumen of right ear Cerumen removed and symptoms resolved. Discussed appropriate methods for cleaning ears.        Marcy Siren, D.O. 01/09/2020, 10:53 AM Primary Care at [facility]

## 2020-01-18 ENCOUNTER — Other Ambulatory Visit: Payer: Self-pay

## 2020-01-18 ENCOUNTER — Ambulatory Visit (INDEPENDENT_AMBULATORY_CARE_PROVIDER_SITE_OTHER): Payer: Medicare Other | Admitting: Podiatry

## 2020-01-18 ENCOUNTER — Encounter: Payer: Self-pay | Admitting: Podiatry

## 2020-01-18 DIAGNOSIS — E1121 Type 2 diabetes mellitus with diabetic nephropathy: Secondary | ICD-10-CM | POA: Diagnosis not present

## 2020-01-18 DIAGNOSIS — B351 Tinea unguium: Secondary | ICD-10-CM

## 2020-01-18 DIAGNOSIS — M79676 Pain in unspecified toe(s): Secondary | ICD-10-CM

## 2020-01-18 DIAGNOSIS — D689 Coagulation defect, unspecified: Secondary | ICD-10-CM

## 2020-01-18 NOTE — Progress Notes (Signed)
This patient returns to my office for at risk foot care.  This patient requires this care by a professional since this patient will be at risk due to having diabetes and coagulation defect.  Patient is taking coumadin.  This patient is unable to cut nails herself since the patient cannot reach her nails.These nails are painful walking and wearing shoes.  This patient presents for at risk foot care today.  General Appearance  Alert, conversant and in no acute stress.  Vascular  Dorsalis pedis and posterior tibial  pulses are palpable  bilaterally.  Capillary return is within normal limits  bilaterally. Temperature is within normal limits  bilaterally.  Neurologic  Senn-Weinstein monofilament wire test within normal limits  bilaterally. Muscle power within normal limits bilaterally.  Nails Thick disfigured discolored nails with subungual debris  from hallux to fifth toes bilaterally. No evidence of bacterial infection or drainage bilaterally.  Orthopedic  No limitations of motion  feet .  No crepitus or effusions noted.  No bony pathology or digital deformities noted.  Skin  normotropic skin with no porokeratosis noted bilaterally.  No signs of infections or ulcers noted.     Onychomycosis  Pain in right toes  Pain in left toes  Consent was obtained for treatment procedures.   Mechanical debridement of nails 1-5  bilaterally performed with a nail nipper. No dremel usage.   Return office visit   3 months                   Told patient to return for periodic foot care and evaluation due to potential at risk complications.   Darlen Gledhill DPM  

## 2020-02-12 ENCOUNTER — Ambulatory Visit (INDEPENDENT_AMBULATORY_CARE_PROVIDER_SITE_OTHER): Payer: Medicare Other | Admitting: *Deleted

## 2020-02-12 ENCOUNTER — Other Ambulatory Visit: Payer: Self-pay

## 2020-02-12 ENCOUNTER — Encounter (INDEPENDENT_AMBULATORY_CARE_PROVIDER_SITE_OTHER): Payer: Self-pay

## 2020-02-12 DIAGNOSIS — Z7901 Long term (current) use of anticoagulants: Secondary | ICD-10-CM

## 2020-02-12 DIAGNOSIS — Z5181 Encounter for therapeutic drug level monitoring: Secondary | ICD-10-CM | POA: Diagnosis not present

## 2020-02-12 DIAGNOSIS — I480 Paroxysmal atrial fibrillation: Secondary | ICD-10-CM | POA: Diagnosis not present

## 2020-02-12 LAB — POCT INR: INR: 3.2 — AB (ref 2.0–3.0)

## 2020-02-12 NOTE — Patient Instructions (Signed)
Description   Take 1/2 a tablet today, then continue taking 1 tablet daily except 1.5 tablets on Tuesdays, Thursdays, and Saturdays. Eating 2 serving of greens per week. Recheck INR 4 weeks. Coumadin Clinic [phone]

## 2020-02-20 ENCOUNTER — Other Ambulatory Visit: Payer: Self-pay

## 2020-02-20 ENCOUNTER — Encounter: Payer: Self-pay | Admitting: Internal Medicine

## 2020-02-20 ENCOUNTER — Ambulatory Visit (INDEPENDENT_AMBULATORY_CARE_PROVIDER_SITE_OTHER): Payer: Medicare Other | Admitting: Internal Medicine

## 2020-02-20 VITALS — BP 134/73 | HR 64 | Temp 97.3°F | Resp 17 | Wt 193.0 lb

## 2020-02-20 DIAGNOSIS — E785 Hyperlipidemia, unspecified: Secondary | ICD-10-CM

## 2020-02-20 DIAGNOSIS — I480 Paroxysmal atrial fibrillation: Secondary | ICD-10-CM

## 2020-02-20 DIAGNOSIS — E1159 Type 2 diabetes mellitus with other circulatory complications: Secondary | ICD-10-CM | POA: Diagnosis not present

## 2020-02-20 DIAGNOSIS — L249 Irritant contact dermatitis, unspecified cause: Secondary | ICD-10-CM

## 2020-02-20 DIAGNOSIS — I10 Essential (primary) hypertension: Secondary | ICD-10-CM | POA: Diagnosis not present

## 2020-02-20 DIAGNOSIS — E1169 Type 2 diabetes mellitus with other specified complication: Secondary | ICD-10-CM | POA: Diagnosis not present

## 2020-02-20 LAB — POCT GLYCOSYLATED HEMOGLOBIN (HGB A1C): Hemoglobin A1C: 6.8 % — AB (ref 4.0–5.6)

## 2020-02-20 LAB — GLUCOSE, POCT (MANUAL RESULT ENTRY): POC Glucose: 182 mg/dl — AB (ref 70–99)

## 2020-02-20 MED ORDER — METFORMIN HCL 500 MG PO TABS: 500.0000 mg | ORAL_TABLET | Freq: Two times a day (BID) | ORAL | 1 refills | Status: DC

## 2020-02-20 MED ORDER — TRIAMCINOLONE ACETONIDE 0.1 % EX CREA: 1.0000 "application " | TOPICAL_CREAM | Freq: Two times a day (BID) | CUTANEOUS | 0 refills | Status: DC

## 2020-02-20 NOTE — Addendum Note (Signed)
Addended by: De Hollingshead on: 02/20/2020 09:43 AM   Modules accepted: Orders

## 2020-02-20 NOTE — Progress Notes (Addendum)
Subjective:    Tracey Todd - 85 y.o. female MRN [id number]  Date of birth: [birth year]  HPI  Tracey Todd is here for f/u chronic medical conditions.  Diabetes mellitus, Type 2 Disease Monitoring             Blood Sugar Ranges: Fasting - 110-120s             Polyuria: no             Visual problems: no   Urine Microalbumin 62 (sept 2020) --unable to take Ace or Arb due to h/o angioedema  Last A1C: 6.6 (March 2021)   Medications: Metformin 1000 mg BID  Medication Compliance: yes  Medication Side Effects             Hypoglycemia: no   Chronic HTN Disease Monitoring:  Home BP Monitoring - Yes. Typically 120-130/60s Chest pain- no  Dyspnea- no Headache - no  Medications: Amlodipine 5 mg, Bisoprolol 10 mg   Compliance- yes Lightheadedness- no  Edema- no    Also has concerns about a rash on her right side, right underneath her bra line. Has been itching with some mild burning for the past week, slowly improving. No blisters present.    Health Maintenance:  Health Maintenance Due  Topic Date Due  . OPHTHALMOLOGY EXAM  06/27/2019    -  reports that she quit smoking about 56 years ago. She has never used smokeless tobacco. - Review of Systems: Per HPI. - Past Medical History: Patient Active Problem List   Diagnosis Date Noted  . Chronic arthropathy 10/12/2019  . Callus 10/12/2019  . Long term (current) use of anticoagulants 10/02/2018  . Atherosclerosis of aorta (HCC) 07/20/2017  . Diabetic neuropathy (HCC) 07/20/2017  . Moderate to severe pulmonary hypertension (HCC) 11/20/2015  . BMI 34.0-34.9,adult 08/06/2015  . Diabetes mellitus type 2, controlled (HCC) 04/25/2015  . Sick sinus syndrome (HCC) 01/29/2015  . Essential hypertension 10/22/2014  . PAF-fib and flutter 10/22/2014  . Congestive heart failure (HCC) 10/21/2014  . 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 Obesity 07/08/2014  . Vitamin D deficiency 09/28/2013  .  Medication management 09/28/2013  . Hyperlipidemia   . DJD (degenerative joint disease)   . Fatty liver    - Medications: reviewed and updated   Objective:   Physical Exam BP 134/73   Pulse 64   Temp (!) 97.3 F (36.3 C) (Temporal)   Resp 17   Wt 193 lb (87.5 kg)   SpO2 94%   BMI 40.34 kg/m  Physical Exam Constitutional:      General: She is not in acute distress.    Appearance: She is not diaphoretic.  HENT:     Head: Normocephalic and atraumatic.  Eyes:     Conjunctiva/sclera: Conjunctivae normal.  Cardiovascular:     Rate and Rhythm: Normal rate and regular rhythm.     Heart sounds: Normal heart sounds. No murmur heard.   Pulmonary:     Effort: Pulmonary effort is normal. No respiratory distress.     Breath sounds: Normal breath sounds.  Musculoskeletal:        General: Normal range of motion.  Skin:    General: Skin is warm and dry.     Comments: Small approximately 1.5 cm area of erythema and irritation present directly underneath bra band without macules, papules, vesicles, pustules.   Neurological:     Mental Status: She is alert and oriented to person, place, and  time.  Psychiatric:        Mood and Affect: Affect normal.        Judgment: Judgment normal.            Assessment & Plan:   1. Type 2 diabetes mellitus with other circulatory complication, unspecified whether long term insulin use (HCC) A1c 6.8, actually below goal range of 7-8% for age. Will decrease metformin to 500 mg BID in attempt to lessen risk of hypoglycemia.  Counseled on Diabetic diet, my plate method, 962 minutes of moderate intensity exercise/week Blood sugar logs with fasting goals of 80-120 mg/dl, random of less than 180 and in the event of sugars less than 60 mg/dl or greater than 400 mg/dl encouraged to notify the clinic. Advised on the need for annual eye exams, annual foot exams, Pneumonia vaccine. - Glucose (CBG) - HgB A1c - Ambulatory referral to Ophthalmology  2.  Essential hypertension BP at goal today. Continue current regimen.   3. Hyperlipidemia associated with type 2 diabetes mellitus ([place]) Continue Lipitor. Could consider addition of Zetia in the future to optimize LDL.   4. PAF-fib and flutter Regular rate and rhythm at visit. On Coumadin without any concerns about bleeding. INR was 3.2, slightly above goal range on 6/15. Followed by cardiology.   5. Irritant dermatitis Appears most consistent with irritant dermatitis from friction of bra band. Will prescribe steroid cream to apply to the area. Instructed to avoid bra use while at home and to wear loose cotton clothing to prevent moisture from being trapped and worsening symptoms.  - triamcinolone cream (KENALOG) 0.1 %; Apply 1 application topically 2 (two) times daily.  Dispense: 30 g; Refill: 0     Phill Myron, D.O. 02/20/2020, 9:23 AM Primary Care at [facility]

## 2020-02-21 ENCOUNTER — Other Ambulatory Visit: Payer: Self-pay

## 2020-02-21 DIAGNOSIS — I10 Essential (primary) hypertension: Secondary | ICD-10-CM

## 2020-02-21 MED ORDER — AMLODIPINE BESYLATE 5 MG PO TABS: 5.0000 mg | ORAL_TABLET | Freq: Every day | ORAL | 1 refills | Status: DC

## 2020-02-21 MED ORDER — ATORVASTATIN CALCIUM 80 MG PO TABS: ORAL_TABLET | ORAL | 0 refills | Status: DC

## 2020-02-25 ENCOUNTER — Other Ambulatory Visit: Payer: Medicare Other

## 2020-02-26 ENCOUNTER — Ambulatory Visit (INDEPENDENT_AMBULATORY_CARE_PROVIDER_SITE_OTHER): Payer: Medicare Other | Admitting: *Deleted

## 2020-02-26 DIAGNOSIS — R001 Bradycardia, unspecified: Secondary | ICD-10-CM | POA: Diagnosis not present

## 2020-02-26 LAB — CUP PACEART REMOTE DEVICE CHECK
Battery Remaining Longevity: 107 mo
Battery Remaining Percentage: 95.5 %
Battery Voltage: 2.98 V
Brady Statistic AP VP Percent: 87 %
Brady Statistic AP VS Percent: 12 %
Brady Statistic AS VP Percent: 1 %
Brady Statistic AS VS Percent: 1 %
Brady Statistic RA Percent Paced: 91 %
Brady Statistic RV Percent Paced: 89 %
Date Time Interrogation Session: 20210629020012
Implantable Lead Implant Date: 20160226
Implantable Lead Implant Date: 20160226
Implantable Lead Location: 753859
Implantable Lead Location: 753860
Implantable Lead Model: 1948
Implantable Pulse Generator Implant Date: 20160226
Lead Channel Impedance Value: 380 Ohm
Lead Channel Impedance Value: 460 Ohm
Lead Channel Pacing Threshold Amplitude: 0.5 V
Lead Channel Pacing Threshold Amplitude: 1 V
Lead Channel Pacing Threshold Pulse Width: 0.4 ms
Lead Channel Pacing Threshold Pulse Width: 0.4 ms
Lead Channel Sensing Intrinsic Amplitude: 1.8 mV
Lead Channel Sensing Intrinsic Amplitude: 8.1 mV
Lead Channel Setting Pacing Amplitude: 1.5 V
Lead Channel Setting Pacing Amplitude: 2.5 V
Lead Channel Setting Pacing Pulse Width: 0.4 ms
Lead Channel Setting Sensing Sensitivity: 2 mV
Pulse Gen Model: 2240
Pulse Gen Serial Number: 7700661

## 2020-02-27 NOTE — Progress Notes (Signed)
Remote pacemaker transmission.   

## 2020-03-11 ENCOUNTER — Ambulatory Visit: Payer: Medicare Other | Admitting: *Deleted

## 2020-03-11 ENCOUNTER — Other Ambulatory Visit: Payer: Self-pay

## 2020-03-11 DIAGNOSIS — Z7901 Long term (current) use of anticoagulants: Secondary | ICD-10-CM | POA: Diagnosis not present

## 2020-03-11 DIAGNOSIS — I480 Paroxysmal atrial fibrillation: Secondary | ICD-10-CM | POA: Diagnosis not present

## 2020-03-11 LAB — POCT INR: INR: 2.9 (ref 2.0–3.0)

## 2020-03-11 NOTE — Patient Instructions (Addendum)
Description   Continue taking Warfarin 1 tablet daily except 1.5 tablets on Tuesdays, Thursdays, and Saturdays. Eating 2 serving of greens per week. Recheck INR 5 weeks. Coumadin Clinic [phone]

## 2020-03-26 ENCOUNTER — Other Ambulatory Visit: Payer: Self-pay

## 2020-03-26 MED ORDER — POTASSIUM CHLORIDE CRYS ER 20 MEQ PO TBCR: 20.0000 meq | EXTENDED_RELEASE_TABLET | Freq: Every day | ORAL | 0 refills | Status: DC

## 2020-03-31 ENCOUNTER — Other Ambulatory Visit: Payer: Self-pay | Admitting: Internal Medicine

## 2020-03-31 DIAGNOSIS — Z7901 Long term (current) use of anticoagulants: Secondary | ICD-10-CM

## 2020-03-31 DIAGNOSIS — I480 Paroxysmal atrial fibrillation: Secondary | ICD-10-CM

## 2020-03-31 NOTE — Telephone Encounter (Signed)
Rx received for warfarin 5 mg.  Last INR 7/13.  Rx sent to pharmacy

## 2020-04-15 ENCOUNTER — Other Ambulatory Visit: Payer: Self-pay

## 2020-04-15 ENCOUNTER — Ambulatory Visit: Payer: Medicare Other | Admitting: *Deleted

## 2020-04-15 DIAGNOSIS — I480 Paroxysmal atrial fibrillation: Secondary | ICD-10-CM

## 2020-04-15 DIAGNOSIS — Z7901 Long term (current) use of anticoagulants: Secondary | ICD-10-CM | POA: Diagnosis not present

## 2020-04-15 LAB — POCT INR: INR: 2.8 (ref 2.0–3.0)

## 2020-04-15 NOTE — Patient Instructions (Signed)
Description   Continue taking Warfarin 1 tablet daily except 1.5 tablets on Tuesdays, Thursdays, and Saturdays. Eating 2 serving of greens per week. Recheck INR 6 weeks. Coumadin Clinic [phone]

## 2020-04-18 ENCOUNTER — Encounter: Payer: Self-pay | Admitting: Podiatry

## 2020-04-18 ENCOUNTER — Other Ambulatory Visit: Payer: Self-pay

## 2020-04-18 ENCOUNTER — Ambulatory Visit (INDEPENDENT_AMBULATORY_CARE_PROVIDER_SITE_OTHER): Payer: Medicare Other | Admitting: Podiatry

## 2020-04-18 DIAGNOSIS — E1121 Type 2 diabetes mellitus with diabetic nephropathy: Secondary | ICD-10-CM | POA: Diagnosis not present

## 2020-04-18 DIAGNOSIS — D689 Coagulation defect, unspecified: Secondary | ICD-10-CM

## 2020-04-18 DIAGNOSIS — B351 Tinea unguium: Secondary | ICD-10-CM | POA: Diagnosis not present

## 2020-04-18 DIAGNOSIS — M79676 Pain in unspecified toe(s): Secondary | ICD-10-CM | POA: Diagnosis not present

## 2020-04-18 NOTE — Progress Notes (Signed)
This patient returns to my office for at risk foot care.  This patient requires this care by a professional since this patient will be at risk due to having diabetes and coagulation defect.  Patient is taking coumadin.  This patient is unable to cut nails herself since the patient cannot reach her nails.These nails are painful walking and wearing shoes.  This patient presents for at risk foot care today.  General Appearance  Alert, conversant and in no acute stress.  Vascular  Dorsalis pedis and posterior tibial  pulses are palpable  bilaterally.  Capillary return is within normal limits  bilaterally. Temperature is within normal limits  bilaterally.  Neurologic  Senn-Weinstein monofilament wire test within normal limits  bilaterally. Muscle power within normal limits bilaterally.  Nails Thick disfigured discolored nails with subungual debris  from hallux to fifth toes bilaterally. No evidence of bacterial infection or drainage bilaterally.  Orthopedic  No limitations of motion  feet .  No crepitus or effusions noted.  No bony pathology or digital deformities noted.  Skin  normotropic skin with no porokeratosis noted bilaterally.  No signs of infections or ulcers noted.     Onychomycosis  Pain in right toes  Pain in left toes  Consent was obtained for treatment procedures.   Mechanical debridement of nails 1-5  bilaterally performed with a nail nipper. No dremel usage.   Return office visit   3 months                   Told patient to return for periodic foot care and evaluation due to potential at risk complications.   Helane Gunther DPM

## 2020-04-23 NOTE — Progress Notes (Signed)
Electrophysiology Office Note Date: 04/24/2020  ID:  Tracey Todd, Tracey Todd 1935-08-12, MRN [id number]  PCP: Nicolette Bang, DO Primary Cardiologist: Johnsie Cancel Electrophysiologist: Allred  CC: Pacemaker follow-up  Tracey Todd is a 85 y.o. female seen today for Dr Rayann Heman.  She presents today for routine electrophysiology followup.  Since last being seen in our clinic, the patient reports doing relatively well.  She had shingles earlier this year which caused a great deal of pain and inactivity and she has gained some weight. She is working on increasing activity and exploring [facility].  She denies chest pain, palpitations, dyspnea, PND, orthopnea, nausea, vomiting, dizziness, syncope, edema, weight gain, or early satiety.  Device History: STJ dual chamber PPM implanted 2016 for SSS   Past Medical History:  Diagnosis Date  . Atrial flutter ([place])    a. mentioned in 2016 admission.  . Chronic diastolic CHF (congestive heart failure) ([place])   . DJD (degenerative joint disease)   . Fatty liver   . 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 HTN (hypertension)   . Hypercholesteremia   . Mild CAD    a. minimal by cath 2016.  . Nodule of chest wall   . Normal coronary arteries and LVF 10/23/14 10/24/2014  . Pacemaker implanted 10/25/14- St Jude 10/26/2014  . Paroxysmal atrial fibrillation (HCC)    a. identified on device interrogation, burden low  . Pulmonary hypertension ([place])   . Sick sinus syndrome (HCC)    a. s/p STJ dual chamber PPM   . Type II or unspecified type diabetes mellitus without mention of complication, not stated as uncontrolled   . Vitamin D deficiency    Past Surgical History:  Procedure Laterality Date  . BREAST SURGERY    . CATARACT EXTRACTION    . LEFT AND RIGHT HEART CATHETERIZATION WITH CORONARY ANGIOGRAM N/A 10/23/2014   Procedure: LEFT AND RIGHT HEART CATHETERIZATION WITH CORONARY ANGIOGRAM;  Surgeon: Blane Ohara, MD;   Location: [facility] CATH LAB;  Service: Cardiovascular;  Laterality: N/A;  . PERMANENT PACEMAKER INSERTION N/A 10/25/2014   STJ dual chamber PPM implanted by Dr Rayann Heman for SSS  . TONSILLECTOMY AND ADENOIDECTOMY    . TOTAL KNEE ARTHROPLASTY      Current Outpatient Medications  Medication Sig Dispense Refill  . amLODipine (NORVASC) 5 MG tablet Take 1 tablet (5 mg total) by mouth daily. 90 tablet 1  . atorvastatin (LIPITOR) 80 MG tablet Take 1/2 tablet by mouth on Monday and Thursday 30 tablet 0  . bisoprolol (ZEBETA) 10 MG tablet Take 1 tablet (10 mg total) by mouth daily. Take 1 tablet daily for BP 90 tablet 1  . Blood Glucose Monitoring Suppl (ONETOUCH VERIO) w/Device KIT Use to check fasting blood sugar daily. Dx: E11.59 1 kit 0  . calcium carbonate (OS-CAL) 600 MG TABS tablet Take 600 mg by mouth daily with breakfast.    . cetirizine (ZYRTEC ALLERGY) 10 MG tablet Take 10 mg by mouth every other day.     . Cholecalciferol (VITAMIN D) 2000 UNITS CAPS Take 6,000 Units by mouth daily.     . furosemide (LASIX) 40 MG tablet Take 1 tablet by mouth once daily 90 tablet 3  . glucose blood (ONETOUCH VERIO) test strip Use to check fasting blood sugar daily. Dx: E11.59 100 each 2  . Magnesium 500 MG TABS Take 500 mg by mouth 4 (four) times daily.     . metFORMIN (GLUCOPHAGE) 500 MG tablet Take 1  tablet (500 mg total) by mouth 2 (two) times daily with a meal. 360 tablet 1  . OneTouch Delica Lancets 33G MISC Use to check fasting blood sugar daily. Dx: E11.59 100 each 2  . potassium chloride SA (KLOR-CON) 20 MEQ tablet Take 1 tablet (20 mEq total) by mouth daily. 90 tablet 0  . triamcinolone cream (KENALOG) 0.1 % Apply 1 application topically 2 (two) times daily. 30 g 0  . warfarin (COUMADIN) 5 MG tablet TAKE AS DIRECTED BY  COUMADIN  CLINIC 40 tablet 0   No current facility-administered medications for this visit.    Allergies:   Penicillins, Quinapril, Feldene [piroxicam], Calcium-containing compounds,  Celebrex [celecoxib], and Daypro [oxaprozin]   Social History: Social History   Socioeconomic History  . Marital status: Married    Spouse name: Not on file  . Number of children: Not on file  . Years of education: Not on file  . Highest education level: Not on file  Occupational History  . Not on file  Tobacco Use  . Smoking status: Former Smoker    Quit date: 12/06/1963    Years since quitting: 56.4  . Smokeless tobacco: Never Used  Vaping Use  . Vaping Use: Never used  Substance and Sexual Activity  . Alcohol use: No  . Drug use: Never  . Sexual activity: Not on file  Other Topics Concern  . Not on file  Social History Narrative  . Not on file   Social Determinants of Health   Financial Resource Strain:   . Difficulty of Paying Living Expenses: Not on file  Food Insecurity:   . Worried About Programme researcher, broadcasting/film/video in the Last Year: Not on file  . Ran Out of Food in the Last Year: Not on file  Transportation Needs:   . Lack of Transportation (Medical): Not on file  . Lack of Transportation (Non-Medical): Not on file  Physical Activity:   . Days of Exercise per Week: Not on file  . Minutes of Exercise per Session: Not on file  Stress:   . Feeling of Stress : Not on file  Social Connections:   . Frequency of Communication with Friends and Family: Not on file  . Frequency of Social Gatherings with Friends and Family: Not on file  . Attends Religious Services: Not on file  . Active Member of Clubs or Organizations: Not on file  . Attends Banker Meetings: Not on file  . Marital Status: Not on file  Intimate Partner Violence:   . Fear of Current or Ex-Partner: Not on file  . Emotionally Abused: Not on file  . Physically Abused: Not on file  . Sexually Abused: Not on file    Family History: Family History  Problem Relation Age of Onset  . Diabetes Mother   . Cancer Mother   . Cancer Father      Review of Systems: All other systems reviewed and  are otherwise negative except as noted above.   Physical Exam: VS:  BP 126/70   Pulse 60   Ht 4\' 10"  (1.473 m)   Wt 198 lb (89.8 kg)   SpO2 98%   BMI 41.38 kg/m  , BMI Body mass index is 41.38 kg/m.  GEN- The patient is elderly appearing, alert and oriented x 3 today.   HEENT: normocephalic, atraumatic; sclera clear, conjunctiva pink; hearing intact; oropharynx clear; neck supple  Lungs- Clear to ausculation bilaterally, normal work of breathing.  No wheezes, rales, rhonchi Heart-  Regular rate and rhythm (paced) GI- soft, non-tender, non-distended, bowel sounds present  Extremities- no clubbing, cyanosis, or edema  MS- no significant deformity or atrophy Skin- warm and dry, no rash or lesion; PPM pocket well healed Psych- euthymic mood, full affect Neuro- strength and sensation are intact  PPM Interrogation- reviewed in detail today,  See PACEART report  EKG:  EKG is ordered today. EKG today demonstrates AV pacing    Recent Labs: 08/15/2019: Hemoglobin 11.5; Platelets 323 11/14/2019: ALT 7; BUN 20; Creatinine, Ser 0.69; Potassium 4.2; Sodium 140   Wt Readings from Last 3 Encounters:  04/24/20 198 lb (89.8 kg)  02/20/20 193 lb (87.5 kg)  01/09/20 190 lb (86.2 kg)      Assessment and Plan:  1.  Symptomatic bradycardia Normal PPM function See Pace Art report No changes today  2.  Persistent atrial fibrillation/ atrial flutter Burden by device interrogation 2.7% She is asymptomatic If recurrence, would not pursue rhythm control as long as rates are controlled CHADS2VASC is 5 - continue Warfarin  3.  Obesity Body mass index is 41.38 kg/m.  4.  HTN Stable No change required today    Current medicines are reviewed at length with the patient today.   The patient does not have concerns regarding her medicines.  The following changes were made today:  none  Labs/ tests ordered today include: none No orders of the defined types were placed in this  encounter.    Disposition:   Follow up with Merlin, me in 1 year      Signed, Chanetta Marshall, NP 04/24/2020 9:16 AM  [place] [address] [phone] (office) [phone] (fax)

## 2020-04-24 ENCOUNTER — Encounter: Payer: Self-pay | Admitting: Nurse Practitioner

## 2020-04-24 ENCOUNTER — Other Ambulatory Visit: Payer: Self-pay

## 2020-04-24 ENCOUNTER — Ambulatory Visit (INDEPENDENT_AMBULATORY_CARE_PROVIDER_SITE_OTHER): Payer: Medicare Other | Admitting: Nurse Practitioner

## 2020-04-24 VITALS — BP 126/70 | HR 60 | Ht <= 58 in | Wt 198.0 lb

## 2020-04-24 DIAGNOSIS — Z6834 Body mass index (BMI) 34.0-34.9, adult: Secondary | ICD-10-CM

## 2020-04-24 DIAGNOSIS — I10 Essential (primary) hypertension: Secondary | ICD-10-CM

## 2020-04-24 DIAGNOSIS — I495 Sick sinus syndrome: Secondary | ICD-10-CM

## 2020-04-24 DIAGNOSIS — I4819 Other persistent atrial fibrillation: Secondary | ICD-10-CM | POA: Diagnosis not present

## 2020-04-24 LAB — CUP PACEART INCLINIC DEVICE CHECK
Battery Remaining Longevity: 99 mo
Battery Voltage: 2.98 V
Brady Statistic RA Percent Paced: 92 %
Brady Statistic RV Percent Paced: 89 %
Date Time Interrogation Session: 20210826092423
Implantable Lead Implant Date: 20160226
Implantable Lead Implant Date: 20160226
Implantable Lead Location: 753859
Implantable Lead Location: 753860
Implantable Lead Model: 1948
Implantable Pulse Generator Implant Date: 20160226
Lead Channel Impedance Value: 350 Ohm
Lead Channel Impedance Value: 450 Ohm
Lead Channel Pacing Threshold Amplitude: 0.5 V
Lead Channel Pacing Threshold Amplitude: 1 V
Lead Channel Pacing Threshold Amplitude: 1 V
Lead Channel Pacing Threshold Pulse Width: 0.4 ms
Lead Channel Pacing Threshold Pulse Width: 0.4 ms
Lead Channel Pacing Threshold Pulse Width: 0.4 ms
Lead Channel Sensing Intrinsic Amplitude: 1.4 mV
Lead Channel Sensing Intrinsic Amplitude: 7 mV
Lead Channel Setting Pacing Amplitude: 1.5 V
Lead Channel Setting Pacing Amplitude: 2.5 V
Lead Channel Setting Pacing Pulse Width: 0.4 ms
Lead Channel Setting Sensing Sensitivity: 2 mV
Pulse Gen Model: 2240
Pulse Gen Serial Number: 7700661

## 2020-04-24 NOTE — Patient Instructions (Signed)
Medication Instructions:  *If you need a refill on your cardiac medications before your next appointment, please call your pharmacy*   Follow-Up: At [facility], you and your health needs are our priority.  As part of our continuing mission to provide you with exceptional heart care, we have created designated Provider Care Teams.  These Care Teams include your primary Cardiologist (physician) and Advanced Practice Providers (APPs -  Physician Assistants and Nurse Practitioners) who all work together to provide you with the care you need, when you need it.  We recommend signing up for the patient portal called "MyChart".  Sign up information is provided on this After Visit Summary.  MyChart is used to connect with patients for Virtual Visits (Telemedicine).  Patients are able to view lab/test results, encounter notes, upcoming appointments, etc.  Non-urgent messages can be sent to your provider as well.   To learn more about what you can do with MyChart, go to [email or web address].    Your next appointment:   Your physician wants you to follow-up in: 1 YEAR WITH AMBER Glory Buff, NP. You will receive a reminder letter in the mail two months in advance. If you don't receive a letter, please call our office to schedule the follow-up appointment.   The format for your next appointment:   In Person with Gypsy Balsam, NP

## 2020-05-01 ENCOUNTER — Other Ambulatory Visit: Payer: Self-pay | Admitting: Nurse Practitioner

## 2020-05-01 ENCOUNTER — Other Ambulatory Visit: Payer: Self-pay | Admitting: Internal Medicine

## 2020-05-01 DIAGNOSIS — Z7901 Long term (current) use of anticoagulants: Secondary | ICD-10-CM

## 2020-05-01 DIAGNOSIS — I480 Paroxysmal atrial fibrillation: Secondary | ICD-10-CM

## 2020-05-01 DIAGNOSIS — I10 Essential (primary) hypertension: Secondary | ICD-10-CM

## 2020-05-16 ENCOUNTER — Encounter: Payer: Self-pay | Admitting: Internal Medicine

## 2020-05-16 ENCOUNTER — Telehealth (INDEPENDENT_AMBULATORY_CARE_PROVIDER_SITE_OTHER): Payer: Medicare Other | Admitting: Internal Medicine

## 2020-05-16 DIAGNOSIS — E1169 Type 2 diabetes mellitus with other specified complication: Secondary | ICD-10-CM | POA: Diagnosis not present

## 2020-05-16 DIAGNOSIS — E1159 Type 2 diabetes mellitus with other circulatory complications: Secondary | ICD-10-CM | POA: Diagnosis not present

## 2020-05-16 DIAGNOSIS — E785 Hyperlipidemia, unspecified: Secondary | ICD-10-CM | POA: Diagnosis not present

## 2020-05-16 DIAGNOSIS — I10 Essential (primary) hypertension: Secondary | ICD-10-CM | POA: Diagnosis not present

## 2020-05-16 MED ORDER — ONETOUCH VERIO VI STRP: ORAL_STRIP | 2 refills | Status: DC

## 2020-05-16 MED ORDER — METFORMIN HCL 500 MG PO TABS: 500.0000 mg | ORAL_TABLET | Freq: Two times a day (BID) | ORAL | 1 refills | Status: DC

## 2020-05-16 NOTE — Progress Notes (Signed)
Virtual Visit via Telephone Note  I connected with Tracey Todd, on 05/16/2020 at 8:51 AM by telephone due to the COVID-19 pandemic and verified that I am speaking with the correct person using two identifiers.   Consent: I discussed the limitations, risks, security and privacy concerns of performing an evaluation and management service by telephone and the availability of in person appointments. I also discussed with the patient that there may be a patient responsible charge related to this service. The patient expressed understanding and agreed to proceed.   Location of Patient: Home   Location of Provider: Home    Persons participating in Telemedicine visit: Tracey Todd      History of Present Illness: Patient has a visit to f/u chronic medical conditions.   Diabetes mellitus, Type 2 Disease Monitoring             Blood Sugar Ranges: Fasting - 120-140              Polyuria: no              Visual problems: no   Urine Microalbumin 62 ---unable to tolerate Ace/Arb due to angioedemia   Last A1C: 6.8 (June 2021)   Medications: Metformin 500 mg BID (decreased from Metformin 1000 mg BID)  Medication Compliance: yes  Medication Side Effects             Hypoglycemia: no   Chronic HTN Disease Monitoring:  Home BP Monitoring - Yes. Typically 120-130/60s Chest pain- no  Dyspnea- no Headache - no  Medications: Amlodipine 5 mg, Bisoprolol 10 mg   Compliance- yes Lightheadedness- no  Edema- no  .   Past Medical History:  Diagnosis Date   Atrial flutter ([place])    a. mentioned in 2016 admission.   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 minimal by cath 2016.   Nodule of chest wall    Normal coronary arteries and LVF 10/23/14 10/24/2014    Pacemaker implanted 10/25/14- St Jude 10/26/2014   Paroxysmal atrial fibrillation (HCC)    a. identified on device interrogation, burden low   Pulmonary hypertension (HCC)    Sick sinus syndrome ([place])    a. s/p STJ dual chamber PPM    Type II or unspecified type diabetes mellitus without mention of complication, not stated as uncontrolled    Vitamin D deficiency    Allergies  Allergen Reactions   Penicillins Anaphylaxis   Quinapril     Angioedema    Feldene [Piroxicam] Other (See Comments)    Bleeding    Calcium-Containing Compounds Other (See Comments)    "calcium deposits on skin"   Celebrex [Celecoxib] Itching   Daypro [Oxaprozin] Itching    Current Outpatient Medications on File Prior to Visit  Medication Sig Dispense Refill   amLODipine (NORVASC) 5 MG tablet Take 1 tablet (5 mg total) by mouth daily. 90 tablet 1   atorvastatin (LIPITOR) 80 MG tablet Take 1/2 tablet by mouth on Monday and Thursday 30 tablet 0   bisoprolol (ZEBETA) 10 MG tablet Take 1 tablet by mouth once daily for blood pressure 90 tablet 0   Blood Glucose Monitoring Suppl (ONETOUCH VERIO) w/Device KIT Use to check fasting blood sugar daily. Dx: E11.59 1 kit 0  calcium carbonate (OS-CAL) 600 MG TABS tablet Take 600 mg by mouth daily with breakfast.     cetirizine (ZYRTEC ALLERGY) 10 MG tablet Take 10 mg by mouth every other day.      Cholecalciferol (VITAMIN D) 2000 UNITS CAPS Take 6,000 Units by mouth daily.      furosemide (LASIX) 40 MG tablet Take 1 tablet by mouth once daily 90 tablet 3   glucose blood (ONETOUCH VERIO) test strip Use to check fasting blood sugar daily. Dx: E11.59 100 each 2   Magnesium 500 MG TABS Take 500 mg by mouth 4 (four) times daily.      metFORMIN (GLUCOPHAGE) 500 MG tablet Take 1 tablet (500 mg total) by mouth 2 (two) times daily with a meal. 360 tablet 1   OneTouch Delica Lancets 43P MISC Use to check fasting blood sugar daily. Dx: E11.59 100 each 2    potassium chloride SA (KLOR-CON) 20 MEQ tablet Take 1 tablet (20 mEq total) by mouth daily. 90 tablet 0   triamcinolone cream (KENALOG) 0.1 % Apply 1 application topically 2 (two) times daily. 30 g 0   warfarin (COUMADIN) 5 MG tablet TAKE AS DIRECTED BY  COUMADIN  CLINIC 40 tablet 3   No current facility-administered medications on file prior to visit.    Observations/Objective: NAD. Speaking clearly.  Work of breathing normal.  Alert and oriented. Mood appropriate.   Assessment and Plan: 1. Type 2 diabetes mellitus with other circulatory complication, unspecified whether long term insulin use (HCC) Last A1c was actually under goal with result <7. At that time, decreased Metformin to 500 mg BID from 1000 mg BID. Patient concerned since fasting CBGs now in 120-140s. Dicussed that at her age, prefer CBGs to run on the slightly higher side of normal range and that goal A1c is 7-8%. Continue current regimen and repeat at upcoming lab. Not candidate for Ace/Arb due to angioedema.  - Hemoglobin A1c; Future - Microalbumin/Creatinine Ratio, Urine; Future - glucose blood (ONETOUCH VERIO) test strip; Use to check fasting blood sugar daily. Dx: E11.59  Dispense: 100 each; Refill: 2 - metFORMIN (GLUCOPHAGE) 500 MG tablet; Take 1 tablet (500 mg total) by mouth 2 (two) times daily with a meal.  Dispense: 360 tablet; Refill: 1  2. Essential hypertension Well controlled. Asymptomatic. Continue current regimen.   3. Hyperlipidemia associated with type 2 diabetes mellitus ([place]) Continue statin therapy.    Follow Up Instructions: Labs 9/20    I discussed the assessment and treatment plan with the patient. The patient was provided an opportunity to ask questions and all were answered. The patient agreed with the plan and demonstrated an understanding of the instructions.   The patient was advised to call back or seek an in-person evaluation if the symptoms worsen or if the condition fails to improve as  anticipated.     I provided 20 minutes total of non-face-to-face time during this encounter including median intraservice time, reviewing previous notes, investigations, ordering medications, medical decision making, coordinating care and patient verbalized understanding at the end of the visit.    Phill Myron, D.O. Primary Care at [facility]  05/16/2020, 8:51 AM

## 2020-05-19 ENCOUNTER — Other Ambulatory Visit: Payer: Self-pay

## 2020-05-19 ENCOUNTER — Other Ambulatory Visit (INDEPENDENT_AMBULATORY_CARE_PROVIDER_SITE_OTHER): Payer: Medicare Other

## 2020-05-19 DIAGNOSIS — Z23 Encounter for immunization: Secondary | ICD-10-CM | POA: Diagnosis not present

## 2020-05-19 DIAGNOSIS — E1159 Type 2 diabetes mellitus with other circulatory complications: Secondary | ICD-10-CM

## 2020-05-19 NOTE — Progress Notes (Signed)
Patient here for repeat labs. While here she requested that flu shot be updated. KWalker, CMA.

## 2020-05-20 LAB — MICROALBUMIN / CREATININE URINE RATIO
Creatinine, Urine: 27 mg/dL
Microalb/Creat Ratio: 84 mg/g creat — ABNORMAL HIGH (ref 0–29)
Microalbumin, Urine: 22.8 ug/mL

## 2020-05-20 LAB — HEMOGLOBIN A1C
Est. average glucose Bld gHb Est-mCnc: 171 mg/dL
Hgb A1c MFr Bld: 7.6 % — ABNORMAL HIGH (ref 4.8–5.6)

## 2020-05-23 NOTE — Progress Notes (Signed)
Patient notified of results & recommendations. Expressed understanding. Has follow up appointment scheduled on 08/18/2020.  Patient would like to know if she can take Metformin 1000 mg in the morning & 500 mg in the evening. She states that her readings at home are still higher than she'd like in the mornings(120s-130s) & she'd feel better if adjustments were made.  Please advise.

## 2020-05-27 ENCOUNTER — Ambulatory Visit (INDEPENDENT_AMBULATORY_CARE_PROVIDER_SITE_OTHER): Payer: Medicare Other | Admitting: Emergency Medicine

## 2020-05-27 ENCOUNTER — Other Ambulatory Visit: Payer: Self-pay

## 2020-05-27 ENCOUNTER — Ambulatory Visit: Payer: Medicare Other

## 2020-05-27 DIAGNOSIS — R001 Bradycardia, unspecified: Secondary | ICD-10-CM

## 2020-05-27 DIAGNOSIS — Z7901 Long term (current) use of anticoagulants: Secondary | ICD-10-CM

## 2020-05-27 DIAGNOSIS — I480 Paroxysmal atrial fibrillation: Secondary | ICD-10-CM | POA: Diagnosis not present

## 2020-05-27 LAB — CUP PACEART REMOTE DEVICE CHECK
Battery Remaining Longevity: 108 mo
Battery Remaining Percentage: 95.5 %
Battery Voltage: 2.98 V
Brady Statistic AP VP Percent: 99 %
Brady Statistic AP VS Percent: 1 %
Brady Statistic AS VP Percent: 1 %
Brady Statistic AS VS Percent: 1 %
Brady Statistic RA Percent Paced: 88 %
Brady Statistic RV Percent Paced: 99 %
Date Time Interrogation Session: 20210928020015
Implantable Lead Implant Date: 20160226
Implantable Lead Implant Date: 20160226
Implantable Lead Location: 753859
Implantable Lead Location: 753860
Implantable Lead Model: 1948
Implantable Pulse Generator Implant Date: 20160226
Lead Channel Impedance Value: 410 Ohm
Lead Channel Impedance Value: 480 Ohm
Lead Channel Pacing Threshold Amplitude: 0.625 V
Lead Channel Pacing Threshold Amplitude: 1 V
Lead Channel Pacing Threshold Pulse Width: 0.4 ms
Lead Channel Pacing Threshold Pulse Width: 0.4 ms
Lead Channel Sensing Intrinsic Amplitude: 1.1 mV
Lead Channel Sensing Intrinsic Amplitude: 9.1 mV
Lead Channel Setting Pacing Amplitude: 1.625
Lead Channel Setting Pacing Amplitude: 2.5 V
Lead Channel Setting Pacing Pulse Width: 0.4 ms
Lead Channel Setting Sensing Sensitivity: 2 mV
Pulse Gen Model: 2240
Pulse Gen Serial Number: 7700661

## 2020-05-27 LAB — POCT INR: INR: 2.7 (ref 2.0–3.0)

## 2020-05-27 NOTE — Patient Instructions (Signed)
Description   Continue on same dosage Warfarin 1 tablet daily except 1.5 tablets on Tuesdays, Thursdays, and Saturdays. Eating 2 serving of greens per week. Recheck INR 6 weeks. Coumadin Clinic [phone]

## 2020-05-28 ENCOUNTER — Telehealth: Payer: Medicare Other | Admitting: Internal Medicine

## 2020-05-29 ENCOUNTER — Other Ambulatory Visit: Payer: Medicare Other

## 2020-05-29 NOTE — Progress Notes (Signed)
Remote pacemaker transmission.   

## 2020-06-10 ENCOUNTER — Encounter: Payer: Self-pay | Admitting: Internal Medicine

## 2020-06-10 ENCOUNTER — Telehealth (INDEPENDENT_AMBULATORY_CARE_PROVIDER_SITE_OTHER): Payer: Medicare Other | Admitting: Internal Medicine

## 2020-06-10 DIAGNOSIS — E1159 Type 2 diabetes mellitus with other circulatory complications: Secondary | ICD-10-CM | POA: Diagnosis not present

## 2020-06-10 DIAGNOSIS — R197 Diarrhea, unspecified: Secondary | ICD-10-CM | POA: Diagnosis not present

## 2020-06-10 MED ORDER — METFORMIN HCL ER 500 MG PO TB24: 500.0000 mg | ORAL_TABLET | Freq: Two times a day (BID) | ORAL | 0 refills | Status: DC

## 2020-06-10 NOTE — Progress Notes (Signed)
Virtual Visit via Telephone Note  I connected with Tracey Todd, on 06/10/2020 at 4:08 PM by telephone due to the COVID-19 pandemic and verified that I am speaking with the correct person using two identifiers.   Consent: I discussed the limitations, risks, security and privacy concerns of performing an evaluation and management service by telephone and the availability of in person appointments. I also discussed with the patient that there may be a patient responsible charge related to this service. The patient expressed understanding and agreed to proceed.   Location of Patient: Home   Location of Provider: Clinic    Persons participating in Telemedicine visit: Levonne Carreras [facility] Dr. Juleen China      History of Present Illness: Patient has a visit for concerns about diarrhea. Has been present for 2 weeks. Says has not had firm bowel movement. No abdominal pain, nausea, vomiting, periods of constipation, hematochezia, melena, fevers. No concern for food sources. No travel. Thinks related to Metformin.    Past Medical History:  Diagnosis Date  . Atrial flutter ([place])    a. mentioned in 2016 admission.  . Chronic diastolic CHF (congestive heart failure) ([place])   . DJD (degenerative joint disease)   . Fatty liver   . 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 HTN (hypertension)   . Hypercholesteremia   . Mild CAD    a. minimal by cath 2016.  . Nodule of chest wall   . Normal coronary arteries and LVF 10/23/14 10/24/2014  . Pacemaker implanted 10/25/14- St Jude 10/26/2014  . Paroxysmal atrial fibrillation (HCC)    a. identified on device interrogation, burden low  . Pulmonary hypertension ([place])   . Sick sinus syndrome (HCC)    a. s/p STJ dual chamber PPM   . Type II or unspecified type diabetes mellitus without mention of complication, not stated as uncontrolled   . Vitamin D deficiency    Allergies  Allergen Reactions  . Penicillins Anaphylaxis  . Quinapril      Angioedema   . Feldene [Piroxicam] Other (See Comments)    Bleeding   . Calcium-Containing Compounds Other (See Comments)    "calcium deposits on skin"  . Celebrex [Celecoxib] Itching  . Daypro [Oxaprozin] Itching    Current Outpatient Medications on File Prior to Visit  Medication Sig Dispense Refill  . amLODipine (NORVASC) 5 MG tablet Take 1 tablet (5 mg total) by mouth daily. 90 tablet 1  . atorvastatin (LIPITOR) 80 MG tablet Take 1/2 tablet by mouth on Monday and Thursday 30 tablet 0  . bisoprolol (ZEBETA) 10 MG tablet Take 1 tablet by mouth once daily for blood pressure 90 tablet 0  . Blood Glucose Monitoring Suppl (ONETOUCH VERIO) w/Device KIT Use to check fasting blood sugar daily. Dx: E11.59 1 kit 0  . calcium carbonate (OS-CAL) 600 MG TABS tablet Take 600 mg by mouth daily with breakfast.    . cetirizine (ZYRTEC ALLERGY) 10 MG tablet Take 10 mg by mouth every other day.     . Cholecalciferol (VITAMIN D) 2000 UNITS CAPS Take 6,000 Units by mouth daily.     . furosemide (LASIX) 40 MG tablet Take 1 tablet by mouth once daily 90 tablet 3  . glucose blood (ONETOUCH VERIO) test strip Use to check fasting blood sugar daily. Dx: E11.59 100 each 2  . Magnesium 500 MG TABS Take 500 mg by mouth 4 (four) times daily.     . metFORMIN (GLUCOPHAGE) 500 MG tablet Take 1  tablet (500 mg total) by mouth 2 (two) times daily with a meal. 360 tablet 1  . OneTouch Delica Lancets 16X MISC Use to check fasting blood sugar daily. Dx: E11.59 100 each 2  . potassium chloride SA (KLOR-CON) 20 MEQ tablet Take 1 tablet (20 mEq total) by mouth daily. 90 tablet 0  . triamcinolone cream (KENALOG) 0.1 % Apply 1 application topically 2 (two) times daily. 30 g 0  . warfarin (COUMADIN) 5 MG tablet TAKE AS DIRECTED BY  COUMADIN  CLINIC 40 tablet 3   No current facility-administered medications on file prior to visit.    Observations/Objective: NAD. Speaking clearly.  Work of breathing normal.  Alert and  oriented. Mood appropriate.   Assessment and Plan: 1. Diarrhea, unspecified type Will try switching to extended release Metformin to see if improves/resolves diarrhea. If no change, would warrant further work up of other etiologies.  - metFORMIN (GLUCOPHAGE XR) 500 MG 24 hr tablet; Take 1 tablet (500 mg total) by mouth in the morning and at bedtime.  Dispense: 180 tablet; Refill: 0  2. Type 2 diabetes mellitus with other circulatory complication, unspecified whether long term insulin use (HCC) CBGS <130 fasting. Well controlled.    Follow Up Instructions: PRN and for routine medical care    I discussed the assessment and treatment plan with the patient. The patient was provided an opportunity to ask questions and all were answered. The patient agreed with the plan and demonstrated an understanding of the instructions.   The patient was advised to call back or seek an in-person evaluation if the symptoms worsen or if the condition fails to improve as anticipated.     I provided 12 minutes total of non-face-to-face time during this encounter including median intraservice time, reviewing previous notes, investigations, ordering medications, medical decision making, coordinating care and patient verbalized understanding at the end of the visit.    Phill Myron, D.O. Primary Care at [facility]  06/10/2020, 4:08 PM

## 2020-06-18 ENCOUNTER — Other Ambulatory Visit: Payer: Self-pay | Admitting: Internal Medicine

## 2020-06-18 DIAGNOSIS — Z1231 Encounter for screening mammogram for malignant neoplasm of breast: Secondary | ICD-10-CM

## 2020-06-26 ENCOUNTER — Other Ambulatory Visit: Payer: Self-pay

## 2020-06-26 DIAGNOSIS — E1159 Type 2 diabetes mellitus with other circulatory complications: Secondary | ICD-10-CM

## 2020-06-26 MED ORDER — ONETOUCH DELICA LANCETS 33G MISC: 2 refills | Status: DC

## 2020-07-04 DIAGNOSIS — H26493 Other secondary cataract, bilateral: Secondary | ICD-10-CM | POA: Diagnosis not present

## 2020-07-04 DIAGNOSIS — H40023 Open angle with borderline findings, high risk, bilateral: Secondary | ICD-10-CM | POA: Diagnosis not present

## 2020-07-04 DIAGNOSIS — E119 Type 2 diabetes mellitus without complications: Secondary | ICD-10-CM | POA: Diagnosis not present

## 2020-07-04 DIAGNOSIS — H04123 Dry eye syndrome of bilateral lacrimal glands: Secondary | ICD-10-CM | POA: Diagnosis not present

## 2020-07-04 LAB — HM DIABETES EYE EXAM

## 2020-07-08 ENCOUNTER — Other Ambulatory Visit: Payer: Self-pay

## 2020-07-08 ENCOUNTER — Ambulatory Visit: Payer: Medicare Other

## 2020-07-08 DIAGNOSIS — I480 Paroxysmal atrial fibrillation: Secondary | ICD-10-CM

## 2020-07-08 DIAGNOSIS — Z7901 Long term (current) use of anticoagulants: Secondary | ICD-10-CM

## 2020-07-08 LAB — POCT INR: INR: 2.5 (ref 2.0–3.0)

## 2020-07-08 NOTE — Patient Instructions (Signed)
Description   Continue on same dosage Warfarin 1 tablet daily except 1.5 tablets on Tuesdays, Thursdays, and Saturdays. Eating 2 serving of greens per week. Recheck INR 6 weeks. Coumadin Clinic [phone]

## 2020-07-23 ENCOUNTER — Other Ambulatory Visit: Payer: Self-pay

## 2020-07-23 ENCOUNTER — Encounter: Payer: Self-pay | Admitting: Podiatry

## 2020-07-23 ENCOUNTER — Ambulatory Visit (INDEPENDENT_AMBULATORY_CARE_PROVIDER_SITE_OTHER): Payer: Medicare Other | Admitting: Podiatry

## 2020-07-23 DIAGNOSIS — B351 Tinea unguium: Secondary | ICD-10-CM | POA: Diagnosis not present

## 2020-07-23 DIAGNOSIS — E1121 Type 2 diabetes mellitus with diabetic nephropathy: Secondary | ICD-10-CM

## 2020-07-23 DIAGNOSIS — M79676 Pain in unspecified toe(s): Secondary | ICD-10-CM | POA: Diagnosis not present

## 2020-07-23 DIAGNOSIS — D689 Coagulation defect, unspecified: Secondary | ICD-10-CM | POA: Diagnosis not present

## 2020-07-23 NOTE — Progress Notes (Signed)
This patient returns to my office for at risk foot care.  This patient requires this care by a professional since this patient will be at risk due to having diabetes and coagulation defect.  Patient is taking coumadin.  This patient is unable to cut nails herself since the patient cannot reach her nails.These nails are painful walking and wearing shoes.  This patient presents for at risk foot care today.  General Appearance  Alert, conversant and in no acute stress.  Vascular  Dorsalis pedis and posterior tibial  pulses are weakly  palpable  bilaterally.  Capillary return is within normal limits  bilaterally. Temperature is within normal limits  Bilaterally. Absent hair.  Neurologic  Senn-Weinstein monofilament wire test within normal limits  bilaterally. Muscle power within normal limits bilaterally.  Nails Thick disfigured discolored nails with subungual debris  from hallux to fifth toes bilaterally. No evidence of bacterial infection or drainage bilaterally.  Orthopedic  No limitations of motion  feet .  No crepitus or effusions noted.  No bony pathology or digital deformities noted.  Skin  normotropic skin with no porokeratosis noted bilaterally.  No signs of infections or ulcers noted.     Onychomycosis  Pain in right toes  Pain in left toes  Consent was obtained for treatment procedures.   Mechanical debridement of nails 1-5  bilaterally performed with a nail nipper. No dremel usage.   Return office visit   3 months                   Told patient to return for periodic foot care and evaluation due to potential at risk complications.   Helane Gunther DPM

## 2020-07-30 ENCOUNTER — Ambulatory Visit
Admission: RE | Admit: 2020-07-30 | Discharge: 2020-07-30 | Disposition: A | Discharge status: Home / Self Care | Payer: Medicare Other | Source: Ambulatory Visit | Attending: Internal Medicine | Admitting: Internal Medicine

## 2020-07-30 ENCOUNTER — Other Ambulatory Visit: Payer: Self-pay

## 2020-07-30 DIAGNOSIS — Z1231 Encounter for screening mammogram for malignant neoplasm of breast: Secondary | ICD-10-CM | POA: Diagnosis not present

## 2020-07-31 ENCOUNTER — Telehealth: Payer: Self-pay

## 2020-07-31 NOTE — Telephone Encounter (Signed)
-----   Message from Hoy Register, MD sent at 07/31/2020  1:02 PM EST ----- Please inform her that her mammogram is negative for malignancy

## 2020-07-31 NOTE — Telephone Encounter (Signed)
Patient name and DOB has been verified Patient was informed of lab results. Patient had no questions.  

## 2020-08-18 ENCOUNTER — Other Ambulatory Visit: Payer: Self-pay

## 2020-08-18 ENCOUNTER — Other Ambulatory Visit: Payer: Medicare Other

## 2020-08-18 DIAGNOSIS — E785 Hyperlipidemia, unspecified: Secondary | ICD-10-CM

## 2020-08-18 DIAGNOSIS — I10 Essential (primary) hypertension: Secondary | ICD-10-CM | POA: Diagnosis not present

## 2020-08-18 DIAGNOSIS — E1159 Type 2 diabetes mellitus with other circulatory complications: Secondary | ICD-10-CM | POA: Diagnosis not present

## 2020-08-18 DIAGNOSIS — E1169 Type 2 diabetes mellitus with other specified complication: Secondary | ICD-10-CM

## 2020-08-18 DIAGNOSIS — R197 Diarrhea, unspecified: Secondary | ICD-10-CM

## 2020-08-18 MED ORDER — AMLODIPINE BESYLATE 5 MG PO TABS: 5.0000 mg | ORAL_TABLET | Freq: Every day | ORAL | 2 refills | Status: DC

## 2020-08-18 MED ORDER — ATORVASTATIN CALCIUM 80 MG PO TABS
40.0000 mg | ORAL_TABLET | Freq: Every day | ORAL | 2 refills | Status: DC
Start: 2020-08-18 — End: 2021-06-08

## 2020-08-18 MED ORDER — BISOPROLOL FUMARATE 10 MG PO TABS: ORAL_TABLET | ORAL | 2 refills | Status: DC

## 2020-08-18 MED ORDER — ATORVASTATIN CALCIUM 80 MG PO TABS
40.0000 mg | ORAL_TABLET | Freq: Every day | ORAL | 2 refills | Status: DC
Start: 2020-08-18 — End: 2020-08-18

## 2020-08-18 MED ORDER — METFORMIN HCL ER 500 MG PO TB24: 500.0000 mg | ORAL_TABLET | Freq: Two times a day (BID) | ORAL | 2 refills | Status: DC

## 2020-08-18 MED ORDER — POTASSIUM CHLORIDE CRYS ER 20 MEQ PO TBCR: 20.0000 meq | EXTENDED_RELEASE_TABLET | Freq: Every day | ORAL | 2 refills | Status: DC

## 2020-08-19 ENCOUNTER — Ambulatory Visit: Payer: Medicare Other | Admitting: *Deleted

## 2020-08-19 DIAGNOSIS — Z7901 Long term (current) use of anticoagulants: Secondary | ICD-10-CM | POA: Diagnosis not present

## 2020-08-19 DIAGNOSIS — I480 Paroxysmal atrial fibrillation: Secondary | ICD-10-CM

## 2020-08-19 LAB — BASIC METABOLIC PANEL
BUN/Creatinine Ratio: 26 (ref 12–28)
BUN: 18 mg/dL (ref 8–27)
CO2: 25 mmol/L (ref 20–29)
Calcium: 9.6 mg/dL (ref 8.7–10.3)
Chloride: 105 mmol/L (ref 96–106)
Creatinine, Ser: 0.7 mg/dL (ref 0.57–1.00)
GFR calc Af Amer: 93 mL/min/{1.73_m2} (ref >59)
GFR calc non Af Amer: 80 mL/min/{1.73_m2} (ref >59)
Glucose: 127 mg/dL — ABNORMAL HIGH (ref 65–99)
Potassium: 4.8 mmol/L (ref 3.5–5.2)
Sodium: 140 mmol/L (ref 134–144)

## 2020-08-19 LAB — POCT INR: INR: 2.7 (ref 2.0–3.0)

## 2020-08-19 LAB — CBC WITH DIFFERENTIAL/PLATELET
Basophils Absolute: 0.1 10*3/uL (ref 0.0–0.2)
Basos: 1 %
EOS (ABSOLUTE): 0.1 10*3/uL (ref 0.0–0.4)
Eos: 2 %
Hematocrit: 37.6 % (ref 34.0–46.6)
Hemoglobin: 12.1 g/dL (ref 11.1–15.9)
Immature Grans (Abs): 0 10*3/uL (ref 0.0–0.1)
Immature Granulocytes: 1 %
Lymphocytes Absolute: 1.7 10*3/uL (ref 0.7–3.1)
Lymphs: 30 %
MCH: 26.8 pg (ref 26.6–33.0)
MCHC: 32.2 g/dL (ref 31.5–35.7)
MCV: 83 fL (ref 79–97)
Monocytes Absolute: 0.6 10*3/uL (ref 0.1–0.9)
Monocytes: 11 %
Neutrophils Absolute: 3.1 10*3/uL (ref 1.4–7.0)
Neutrophils: 55 %
Platelets: 309 10*3/uL (ref 150–450)
RBC: 4.52 x10E6/uL (ref 3.77–5.28)
RDW: 15.6 % — ABNORMAL HIGH (ref 11.7–15.4)
WBC: 5.5 10*3/uL (ref 3.4–10.8)

## 2020-08-19 LAB — HEMOGLOBIN A1C
Est. average glucose Bld gHb Est-mCnc: 177 mg/dL
Hgb A1c MFr Bld: 7.8 % — ABNORMAL HIGH (ref 4.8–5.6)

## 2020-08-19 NOTE — Patient Instructions (Signed)
Description   Continue taking Warfarin 1 tablet daily except 1.5 tablets on Tuesdays, Thursdays, and Saturdays. Eating 2 serving of greens per week. Recheck INR 6 weeks. Coumadin Clinic [phone]

## 2020-08-28 ENCOUNTER — Ambulatory Visit (INDEPENDENT_AMBULATORY_CARE_PROVIDER_SITE_OTHER): Payer: Medicare Other

## 2020-08-28 DIAGNOSIS — I495 Sick sinus syndrome: Secondary | ICD-10-CM

## 2020-08-28 DIAGNOSIS — R001 Bradycardia, unspecified: Secondary | ICD-10-CM

## 2020-08-28 LAB — CUP PACEART REMOTE DEVICE CHECK
Battery Remaining Longevity: 107 mo
Battery Remaining Percentage: 95.5 %
Battery Voltage: 2.98 V
Brady Statistic AP VP Percent: 99 %
Brady Statistic AP VS Percent: 1 %
Brady Statistic AS VP Percent: 1 %
Brady Statistic AS VS Percent: 1 %
Brady Statistic RA Percent Paced: 96 %
Brady Statistic RV Percent Paced: 99 %
Date Time Interrogation Session: 20211230020013
Implantable Lead Implant Date: 20160226
Implantable Lead Implant Date: 20160226
Implantable Lead Location: 753859
Implantable Lead Location: 753860
Implantable Lead Model: 1948
Implantable Pulse Generator Implant Date: 20160226
Lead Channel Impedance Value: 380 Ohm
Lead Channel Impedance Value: 480 Ohm
Lead Channel Pacing Threshold Amplitude: 0.625 V
Lead Channel Pacing Threshold Amplitude: 1 V
Lead Channel Pacing Threshold Pulse Width: 0.4 ms
Lead Channel Pacing Threshold Pulse Width: 0.4 ms
Lead Channel Sensing Intrinsic Amplitude: 1.1 mV
Lead Channel Sensing Intrinsic Amplitude: 7.8 mV
Lead Channel Setting Pacing Amplitude: 1.625
Lead Channel Setting Pacing Amplitude: 2.5 V
Lead Channel Setting Pacing Pulse Width: 0.4 ms
Lead Channel Setting Sensing Sensitivity: 2 mV
Pulse Gen Model: 2240
Pulse Gen Serial Number: 7700661

## 2020-09-11 ENCOUNTER — Other Ambulatory Visit: Payer: Self-pay | Admitting: Internal Medicine

## 2020-09-11 DIAGNOSIS — Z7901 Long term (current) use of anticoagulants: Secondary | ICD-10-CM

## 2020-09-11 DIAGNOSIS — I480 Paroxysmal atrial fibrillation: Secondary | ICD-10-CM

## 2020-09-11 NOTE — Progress Notes (Signed)
Remote pacemaker transmission.   

## 2020-09-16 ENCOUNTER — Telehealth (INDEPENDENT_AMBULATORY_CARE_PROVIDER_SITE_OTHER): Payer: Self-pay

## 2020-09-16 NOTE — Telephone Encounter (Signed)
-----   Message from Guy Franco, RN sent at 09/15/2020  6:32 PM EST -----  ----- Message ----- From: Arvilla Market, DO Sent: 08/19/2020  10:14 AM EST To: Heidi Dach, CMA  Please call [name] about results. CBC and BMET look good. A1c is 7.8, which is slightly higher than it was in Sept, however given age goal is to be <8.   Thanks,  Marcy Siren, D.O. [facility]  08/19/2020, 10:13 AM

## 2020-09-16 NOTE — Telephone Encounter (Signed)
Spoke with Tracey Todd. After verifying date of birth she is aware that labs normal. A1c 7.8 which is slgihtly higher than her A1c in September but due to age goal is below 8. She verbalized understanding of results. Maryjean Morn, CMA

## 2020-09-30 ENCOUNTER — Ambulatory Visit: Payer: Medicare Other | Admitting: *Deleted

## 2020-09-30 ENCOUNTER — Other Ambulatory Visit: Payer: Self-pay

## 2020-09-30 DIAGNOSIS — I480 Paroxysmal atrial fibrillation: Secondary | ICD-10-CM | POA: Diagnosis not present

## 2020-09-30 DIAGNOSIS — Z7901 Long term (current) use of anticoagulants: Secondary | ICD-10-CM

## 2020-09-30 LAB — POCT INR: INR: 2.8 (ref 2.0–3.0)

## 2020-09-30 NOTE — Patient Instructions (Signed)
Description   Continue taking Warfarin 1 tablet daily except 1.5 tablets on Tuesdays, Thursdays, and Saturdays. Eating 2 serving of greens per week. Recheck INR 7 weeks. [facility] [phone]

## 2020-10-29 ENCOUNTER — Ambulatory Visit (INDEPENDENT_AMBULATORY_CARE_PROVIDER_SITE_OTHER): Payer: Medicare Other | Admitting: Podiatry

## 2020-10-29 ENCOUNTER — Other Ambulatory Visit: Payer: Self-pay

## 2020-10-29 ENCOUNTER — Encounter: Payer: Self-pay | Admitting: Podiatry

## 2020-10-29 DIAGNOSIS — M79676 Pain in unspecified toe(s): Secondary | ICD-10-CM | POA: Diagnosis not present

## 2020-10-29 DIAGNOSIS — E1121 Type 2 diabetes mellitus with diabetic nephropathy: Secondary | ICD-10-CM | POA: Diagnosis not present

## 2020-10-29 DIAGNOSIS — B351 Tinea unguium: Secondary | ICD-10-CM | POA: Diagnosis not present

## 2020-10-29 DIAGNOSIS — D689 Coagulation defect, unspecified: Secondary | ICD-10-CM

## 2020-10-29 NOTE — Progress Notes (Signed)
This patient returns to my office for at risk foot care.  This patient requires this care by a professional since this patient will be at risk due to having diabetes and coagulation defect.  Patient is taking coumadin.  This patient is unable to cut nails herself since the patient cannot reach her nails.These nails are painful walking and wearing shoes.  This patient presents for at risk foot care today.  General Appearance  Alert, conversant and in no acute stress.  Vascular  Dorsalis pedis and posterior tibial  pulses are weakly  palpable  bilaterally.  Capillary return is within normal limits  bilaterally. Temperature is within normal limits  Bilaterally. Absent hair.  Neurologic  Senn-Weinstein monofilament wire test within normal limits  bilaterally. Muscle power within normal limits bilaterally.  Nails Thick disfigured discolored nails with subungual debris  from hallux to fifth toes bilaterally. No evidence of bacterial infection or drainage bilaterally.  Orthopedic  No limitations of motion  feet .  No crepitus or effusions noted.  No bony pathology or digital deformities noted.  Skin  normotropic skin with no porokeratosis noted bilaterally.  No signs of infections or ulcers noted.     Onychomycosis  Pain in right toes  Pain in left toes  Consent was obtained for treatment procedures.   Mechanical debridement of nails 1-5  bilaterally performed with a nail nipper. No dremel usage.   Return office visit   3 months                   Told patient to return for periodic foot care and evaluation due to potential at risk complications.   Helane Gunther DPM

## 2020-11-04 ENCOUNTER — Other Ambulatory Visit: Payer: Self-pay | Admitting: Internal Medicine

## 2020-11-04 DIAGNOSIS — I10 Essential (primary) hypertension: Secondary | ICD-10-CM

## 2020-11-07 ENCOUNTER — Other Ambulatory Visit: Payer: Self-pay | Admitting: *Deleted

## 2020-11-07 DIAGNOSIS — R197 Diarrhea, unspecified: Secondary | ICD-10-CM

## 2020-11-07 MED ORDER — METFORMIN HCL ER 500 MG PO TB24: 500.0000 mg | ORAL_TABLET | Freq: Two times a day (BID) | ORAL | 0 refills | Status: DC

## 2020-11-18 ENCOUNTER — Ambulatory Visit: Payer: Medicare Other | Admitting: *Deleted

## 2020-11-18 ENCOUNTER — Other Ambulatory Visit: Payer: Self-pay

## 2020-11-18 DIAGNOSIS — Z7901 Long term (current) use of anticoagulants: Secondary | ICD-10-CM

## 2020-11-18 DIAGNOSIS — I480 Paroxysmal atrial fibrillation: Secondary | ICD-10-CM | POA: Diagnosis not present

## 2020-11-18 DIAGNOSIS — Z5181 Encounter for therapeutic drug level monitoring: Secondary | ICD-10-CM | POA: Diagnosis not present

## 2020-11-18 LAB — POCT INR: INR: 3.3 — AB (ref 2.0–3.0)

## 2020-11-18 NOTE — Patient Instructions (Signed)
Description   Hold today, then continue taking Warfarin 1 tablet daily except 1.5 tablets on Tuesdays, Thursdays, and Saturdays. Eating 2 serving of greens per week. Recheck INR 4 weeks. [facility] [phone]

## 2020-11-25 ENCOUNTER — Ambulatory Visit: Payer: Medicare Other | Admitting: Internal Medicine

## 2020-11-26 ENCOUNTER — Encounter: Payer: Self-pay | Admitting: Family

## 2020-11-26 ENCOUNTER — Ambulatory Visit (INDEPENDENT_AMBULATORY_CARE_PROVIDER_SITE_OTHER): Payer: Medicare Other | Admitting: Family

## 2020-11-26 ENCOUNTER — Other Ambulatory Visit: Payer: Self-pay

## 2020-11-26 VITALS — BP 187/90 | HR 60 | Ht <= 58 in | Wt 196.8 lb

## 2020-11-26 DIAGNOSIS — E1159 Type 2 diabetes mellitus with other circulatory complications: Secondary | ICD-10-CM | POA: Diagnosis not present

## 2020-11-26 DIAGNOSIS — H938X2 Other specified disorders of left ear: Secondary | ICD-10-CM | POA: Diagnosis not present

## 2020-11-26 DIAGNOSIS — I10 Essential (primary) hypertension: Secondary | ICD-10-CM

## 2020-11-26 DIAGNOSIS — M7989 Other specified soft tissue disorders: Secondary | ICD-10-CM | POA: Diagnosis not present

## 2020-11-26 MED ORDER — AMLODIPINE BESYLATE 5 MG PO TABS: 5.0000 mg | ORAL_TABLET | Freq: Every day | ORAL | 2 refills | Status: DC

## 2020-11-26 MED ORDER — FUROSEMIDE 40 MG PO TABS: 40.0000 mg | ORAL_TABLET | Freq: Every day | ORAL | 0 refills | Status: DC

## 2020-11-26 MED ORDER — METFORMIN HCL ER 500 MG PO TB24: 500.0000 mg | ORAL_TABLET | Freq: Two times a day (BID) | ORAL | 2 refills | Status: DC

## 2020-11-26 MED ORDER — BISOPROLOL FUMARATE 10 MG PO TABS: 10.0000 mg | ORAL_TABLET | Freq: Every day | ORAL | 2 refills | Status: DC

## 2020-11-26 NOTE — Progress Notes (Signed)
Diabetes  Left ear clogged

## 2020-11-26 NOTE — Progress Notes (Signed)
Patient ID: Tracey Todd, female    DOB: 09-11-1934  MRN: [id number]  CC: Hypertension and Diabetes Follow-Up  Subjective: Tracey Todd is a 86 y.o. female who presents for hypertension and diabetes follow-up. Her concerns today include:   1. HYPERTENSION FOLLOW-UP: 05/16/2020 per DO note: Well controlled. Asymptomatic. Continue current regimen.   11/26/2020: Currently taking: see medication list Med Adherence: [x] Yes, reports did have roast beef and bacon sandwich prior to today's visit Medication side effects: [] Yes    [x] No Adherence with salt restriction (low-salt diet): [] Yes    [x] No Exercise: Yes [] No [x] Home Monitoring?: [x] Yes    [] No Monitoring Frequency: [x] Yes    [] No Home BP results range: [x] Yes, 120's-130's/60's-70's Smoking [] Yes [x] No SOB? [x] Yes, sometimes Chest Pain?: [] Yes    [x] No Leg swelling?: [x] Yes, requesting refill of Lasix Headaches?: [] Yes    [x] No  Dizziness? [] Yes    [x] No  2.DIABETES TYPE 2 FOLLOW-UP: 06/10/2020 per DO note: CBGS <130 fasting. Well controlled.   11/27/2019: Med Adherence:  [x] Yes    [] No Medication side effects:  [] Yes    [x] No Home Monitoring?  [x] Yes    [] No Home glucose results range: 140-160 Diet Adherence: [] Yes    [x] No  Exercise: [] Yes    [x] No, left hip pain Hypoglycemic episodes?: [] Yes    [x] No Last eye exam: May 2022  3. EAR CONCERN: Began 2 weeks ago. Left ear feeling of being stopped up and something running out of it. Had similar issue last year with right ear, had it washed out and felt better.   Patient Active Problem List   Diagnosis Date Noted  . Chronic arthropathy 10/12/2019  . Callus 10/12/2019  . Long term (current) use of anticoagulants 10/02/2018  . Atherosclerosis of aorta ([place]) 07/20/2017  . Diabetic neuropathy ([place]) 07/20/2017  . Moderate to severe pulmonary hypertension ([place]) 11/20/2015  . BMI 34.0-34.9,adult 08/06/2015  . Diabetes mellitus type 2,  controlled ([place]) 04/25/2015  . Sick sinus syndrome ([place]) 01/29/2015  . Essential hypertension 10/22/2014  . PAF-fib and flutter 10/22/2014  . Congestive heart failure ([place]) 10/21/2014  . 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 Obesity 07/08/2014  . Vitamin D deficiency 09/28/2013  . Medication management 09/28/2013  . Hyperlipidemia   . DJD (degenerative joint disease)   . Fatty liver      Current Outpatient Medications on File Prior to Visit  Medication Sig Dispense Refill  . atorvastatin (LIPITOR) 80 MG tablet Take 0.5 tablets (40 mg total) by mouth daily. 15 tablet 2  . Blood Glucose Monitoring Suppl (ONETOUCH VERIO) w/Device KIT Use to check fasting blood sugar daily. Dx: E11.59 1 kit 0  . calcium carbonate (OS-CAL) 600 MG TABS tablet Take 600 mg by mouth daily with breakfast.    . cetirizine (ZYRTEC) 10 MG tablet Take 10 mg by mouth every other day.     . Cholecalciferol (VITAMIN D) 2000 UNITS CAPS Take 6,000 Units by mouth daily.     Marland Kitchen glucose blood (ONETOUCH VERIO) test strip Use to check fasting blood sugar daily. Dx: E11.59 100 each 2  . Magnesium 500 MG TABS Take 500 mg by mouth 4 (four) times daily.     Glory Rosebush Delica Lancets 43P MISC Use to check fasting blood sugar daily. Dx: E11.59  100 each 2  . potassium chloride SA (KLOR-CON) 20 MEQ tablet Take 1 tablet (20 mEq total) by mouth daily. 30 tablet 2  . warfarin (COUMADIN) 5 MG tablet TAKE AS DIRECTED BY  COUMADIN  CLINIC 40 tablet 3   No current facility-administered medications on file prior to visit.    Allergies  Allergen Reactions  . Penicillins Anaphylaxis  . Quinapril     Angioedema   . Feldene [Piroxicam] Other (See Comments)    Bleeding   . Calcium-Containing Compounds Other (See Comments)    "calcium deposits on skin"  . Celebrex [Celecoxib] Itching  . Daypro [Oxaprozin] Itching    Social History   Socioeconomic History  . Marital status: Married    Spouse name: Not on file  .  Number of children: Not on file  . Years of education: Not on file  . Highest education level: Not on file  Occupational History  . Not on file  Tobacco Use  . Smoking status: Former Smoker    Quit date: 12/06/1963    Years since quitting: 57.0  . Smokeless tobacco: Never Used  Vaping Use  . Vaping Use: Never used  Substance and Sexual Activity  . Alcohol use: No  . Drug use: Never  . Sexual activity: Not on file  Other Topics Concern  . Not on file  Social History Narrative  . Not on file   Social Determinants of Health   Financial Resource Strain: Not on file  Food Insecurity: Not on file  Transportation Needs: Not on file  Physical Activity: Not on file  Stress: Not on file  Social Connections: Not on file  Intimate Partner Violence: Not on file    Family History  Problem Relation Age of Onset  . Diabetes Mother   . Cancer Mother   . Cancer Father     Past Surgical History:  Procedure Laterality Date  . BREAST SURGERY    . CATARACT EXTRACTION    . LEFT AND RIGHT HEART CATHETERIZATION WITH CORONARY ANGIOGRAM N/A 10/23/2014   Procedure: LEFT AND RIGHT HEART CATHETERIZATION WITH CORONARY ANGIOGRAM;  Surgeon: Michael D Cooper, MD;  Location: [facility] CATH LAB;  Service: Cardiovascular;  Laterality: N/A;  . PERMANENT PACEMAKER INSERTION N/A 10/25/2014   STJ dual chamber PPM implanted by Dr Allred for SSS  . TONSILLECTOMY AND ADENOIDECTOMY    . TOTAL KNEE ARTHROPLASTY      ROS: Review of Systems Negative except as stated above  PHYSICAL EXAM: BP (!) 187/90 (BP Location: Right Arm, Patient Position: Sitting)   Pulse 60   Ht 4' 9.99" (1.473 m)   Wt 196 lb 12.8 oz (89.3 kg)   SpO2 93%   BMI 41.14 kg/m   Physical Exam HENT:     Head: Normocephalic and atraumatic.     Right Ear: Tympanic membrane, ear canal and external ear normal.     Left Ear: Tympanic membrane, ear canal and external ear normal.  Eyes:     Extraocular Movements: Extraocular movements intact.      Conjunctiva/sclera: Conjunctivae normal.     Pupils: Pupils are equal, round, and reactive to light.  Cardiovascular:     Rate and Rhythm: Normal rate and regular rhythm.     Pulses: Normal pulses.     Heart sounds: Normal heart sounds.  Pulmonary:     Effort: Pulmonary effort is normal.     Breath sounds: Normal breath sounds.  Musculoskeletal:     Cervical back: Normal range of   motion and neck supple.  Neurological:     General: No focal deficit present.     Mental Status: She is alert and oriented to person, place, and time.  Psychiatric:        Mood and Affect: Mood normal.        Behavior: Behavior normal.     ASSESSMENT AND PLAN: 1. Essential hypertension: - Blood pressure not at goal during today's visit, goal < 140/90. Patient asymptomatic without chest pressure, chest pain, palpitations, shortness of breath, and worst headache of life. - Patient reports she had roast beef and bacon prior to today's visit. Endorses home blood pressure readings are within normal range.  - Continue Bisoprolol, Amlodipine, and Furosemide as prescribed.  - Follow-up with primary provider in 1 week for blood pressure check.  - BMP to evaluate kidney function and electrolyte balance. - Basic Metabolic Panel - bisoprolol (ZEBETA) 10 MG tablet; Take 1 tablet (10 mg total) by mouth daily. for blood pressure  Dispense: 30 tablet; Refill: 2 - amLODipine (NORVASC) 5 MG tablet; Take 1 tablet (5 mg total) by mouth daily.  Dispense: 30 tablet; Refill: 2 - furosemide (LASIX) 40 MG tablet; Take 1 tablet (40 mg total) by mouth daily.  Dispense: 90 tablet; Refill: 0  2. Type 2 diabetes mellitus with other circulatory complication, unspecified whether long term insulin use ([place]): - Continue Metformin as prescribed.  - Hemoglobin A1c to screen for level of diabetes control. - Follow-up with primary provider in 3 months or sooner if needed.  - Hemoglobin A1c - metFORMIN (GLUCOPHAGE XR) 500 MG 24 hr tablet;  Take 1 tablet (500 mg total) by mouth in the morning and at bedtime. Needs office visit for additional refills.  Dispense: 60 tablet; Refill: 2  3. Leg swelling: - Continue Furosemide as prescribed.  - Follow-up with primary provider as scheduled.  - furosemide (LASIX) 40 MG tablet; Take 1 tablet (40 mg total) by mouth daily.  Dispense: 90 tablet; Refill: 0  4. Clogged ear, left: - Return in 1 week for nurse visit ear lavage.  Patient was given the opportunity to ask questions.  Patient verbalized understanding of the plan and was able to repeat key elements of the plan. Patient was given clear instructions to go to Emergency Department or return to medical center if symptoms don't improve, worsen, or new problems develop.The patient verbalized understanding.   Orders Placed This Encounter  Procedures  . Basic Metabolic Panel  . Hemoglobin A1c     Requested Prescriptions   Signed Prescriptions Disp Refills  . bisoprolol (ZEBETA) 10 MG tablet 30 tablet 2    Sig: Take 1 tablet (10 mg total) by mouth daily. for blood pressure  . amLODipine (NORVASC) 5 MG tablet 30 tablet 2    Sig: Take 1 tablet (5 mg total) by mouth daily.  . metFORMIN (GLUCOPHAGE XR) 500 MG 24 hr tablet 60 tablet 2    Sig: Take 1 tablet (500 mg total) by mouth in the morning and at bedtime. Needs office visit for additional refills.  . furosemide (LASIX) 40 MG tablet 90 tablet 0    Sig: Take 1 tablet (40 mg total) by mouth daily.    Return in about 1 week (around 12/03/2020) for Follow-Up hypertension Dr. Juleen China.  Camillia Herter, NP

## 2020-11-27 ENCOUNTER — Ambulatory Visit (INDEPENDENT_AMBULATORY_CARE_PROVIDER_SITE_OTHER): Payer: Medicare Other

## 2020-11-27 DIAGNOSIS — I495 Sick sinus syndrome: Secondary | ICD-10-CM

## 2020-11-27 LAB — CUP PACEART REMOTE DEVICE CHECK
Battery Remaining Longevity: 110 mo
Battery Remaining Percentage: 95.5 %
Battery Voltage: 2.98 V
Brady Statistic AP VP Percent: 99 %
Brady Statistic AP VS Percent: 1 %
Brady Statistic AS VP Percent: 1 %
Brady Statistic AS VS Percent: 1 %
Brady Statistic RA Percent Paced: 98 %
Brady Statistic RV Percent Paced: 99 %
Date Time Interrogation Session: 20220331035742
Implantable Lead Implant Date: 20160226
Implantable Lead Implant Date: 20160226
Implantable Lead Location: 753859
Implantable Lead Location: 753860
Implantable Lead Model: 1948
Implantable Pulse Generator Implant Date: 20160226
Lead Channel Impedance Value: 410 Ohm
Lead Channel Impedance Value: 490 Ohm
Lead Channel Pacing Threshold Amplitude: 0.5 V
Lead Channel Pacing Threshold Amplitude: 1 V
Lead Channel Pacing Threshold Pulse Width: 0.4 ms
Lead Channel Pacing Threshold Pulse Width: 0.4 ms
Lead Channel Sensing Intrinsic Amplitude: 1.1 mV
Lead Channel Sensing Intrinsic Amplitude: 7.9 mV
Lead Channel Setting Pacing Amplitude: 1.5 V
Lead Channel Setting Pacing Amplitude: 2.5 V
Lead Channel Setting Pacing Pulse Width: 0.4 ms
Lead Channel Setting Sensing Sensitivity: 2 mV
Pulse Gen Model: 2240
Pulse Gen Serial Number: 7700661

## 2020-11-27 LAB — BASIC METABOLIC PANEL
BUN/Creatinine Ratio: 22 (ref 12–28)
BUN: 20 mg/dL (ref 8–27)
CO2: 24 mmol/L (ref 20–29)
Calcium: 9.9 mg/dL (ref 8.7–10.3)
Chloride: 103 mmol/L (ref 96–106)
Creatinine, Ser: 0.91 mg/dL (ref 0.57–1.00)
Glucose: 117 mg/dL — ABNORMAL HIGH (ref 65–99)
Potassium: 4.4 mmol/L (ref 3.5–5.2)
Sodium: 144 mmol/L (ref 134–144)
eGFR: 63 mL/min/{1.73_m2} (ref >59)

## 2020-11-27 LAB — HEMOGLOBIN A1C
Est. average glucose Bld gHb Est-mCnc: 180 mg/dL
Hgb A1c MFr Bld: 7.9 % — ABNORMAL HIGH (ref 4.8–5.6)

## 2020-11-27 NOTE — Progress Notes (Signed)
Kidney function normal.  Hemoglobin A1c at goal. Continue Metformin as prescribed.

## 2020-12-05 ENCOUNTER — Other Ambulatory Visit: Payer: Self-pay

## 2020-12-05 ENCOUNTER — Ambulatory Visit (INDEPENDENT_AMBULATORY_CARE_PROVIDER_SITE_OTHER): Payer: Medicare Other | Admitting: Family

## 2020-12-05 VITALS — BP 146/75 | HR 60 | Ht <= 58 in | Wt 194.8 lb

## 2020-12-05 DIAGNOSIS — H938X2 Other specified disorders of left ear: Secondary | ICD-10-CM

## 2020-12-05 DIAGNOSIS — I10 Essential (primary) hypertension: Secondary | ICD-10-CM | POA: Diagnosis not present

## 2020-12-05 DIAGNOSIS — H938X1 Other specified disorders of right ear: Secondary | ICD-10-CM | POA: Diagnosis not present

## 2020-12-05 NOTE — Progress Notes (Signed)
Patient ID: Tracey Todd, female    DOB: 1935-06-30  MRN: [id number]  CC: Hypertension Follow-Up  Subjective: Tracey Todd is a 86 y.o. female who presents for hypertension follow-up. Her concerns today include:  1. HYPERTENSION FOLLOW-UP: 11/26/2020: - Continue Bisoprolol, Amlodipine, and Furosemide as prescribed.  - Follow-up with primary provider in 1 week for blood pressure check.   12/05/2020: Currently taking: see medication list  Med Adherence: _0  Yes    _1  No Medication side effects: _2  Yes    _3  No SOB? _4  Yes    _5  No Chest Pain?: _6  Yes    _7  No Leg swelling?: _8  Yes    _9  No Headaches?: _10  Yes    _11  No Dizziness? _12  Yes    _13  No  2. CLOGGED EARS FOLLOW-UP: Reports she is hearing better today than last week.   Patient Active Problem List   Diagnosis Date Noted  . Chronic arthropathy 10/12/2019  . Callus 10/12/2019  . Long term (current) use of anticoagulants 10/02/2018  . Atherosclerosis of aorta ([place]) 07/20/2017  . Diabetic neuropathy ([place]) 07/20/2017  . Moderate to severe pulmonary hypertension ([place]) 11/20/2015  . BMI 34.0-34.9,adult 08/06/2015  . Diabetes mellitus type 2, controlled ([place]) 04/25/2015  . Sick sinus syndrome ([place]) 01/29/2015  . Essential hypertension 10/22/2014  . PAF-fib and flutter 10/22/2014  . Congestive heart failure ([place]) 10/21/2014  . 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 Obesity 07/08/2014  . Vitamin D deficiency 09/28/2013  . Medication management 09/28/2013  . Hyperlipidemia   . DJD (degenerative joint disease)   . Fatty liver      Current Outpatient Medications on File Prior to Visit  Medication Sig Dispense Refill  . amLODipine (NORVASC) 5 MG tablet Take 1 tablet (5 mg total) by mouth daily. 30 tablet 2  . atorvastatin (LIPITOR) 80 MG tablet Take 0.5 tablets (40 mg total) by mouth daily. 15 tablet 2  . bisoprolol (ZEBETA) 10 MG tablet Take 1 tablet (10 mg total) by mouth daily. for blood  pressure 30 tablet 2  . Blood Glucose Monitoring Suppl (ONETOUCH VERIO) w/Device KIT Use to check fasting blood sugar daily. Dx: E11.59 1 kit 0  . calcium carbonate (OS-CAL) 600 MG TABS tablet Take 600 mg by mouth daily with breakfast.    . cetirizine (ZYRTEC) 10 MG tablet Take 10 mg by mouth every other day.     . Cholecalciferol (VITAMIN D) 2000 UNITS CAPS Take 6,000 Units by mouth daily.     . furosemide (LASIX) 40 MG tablet Take 1 tablet (40 mg total) by mouth daily. 90 tablet 0  . glucose blood (ONETOUCH VERIO) test strip Use to check fasting blood sugar daily. Dx: E11.59 100 each 2  . Magnesium 500 MG TABS Take 500 mg by mouth 4 (four) times daily.     . metFORMIN (GLUCOPHAGE XR) 500 MG 24 hr tablet Take 1 tablet (500 mg total) by mouth in the morning and at bedtime. Needs office visit for additional refills. 60 tablet 2  . OneTouch Delica Lancets 21H MISC Use to check fasting blood sugar daily. Dx: E11.59 100 each 2  . potassium chloride SA (KLOR-CON) 20 MEQ tablet Take 1 tablet (20 mEq total) by mouth daily. 30 tablet 2  . warfarin (COUMADIN) 5 MG tablet TAKE AS DIRECTED BY  COUMADIN  CLINIC 40 tablet 3   No current facility-administered medications on file prior to visit.    Allergies  Allergen Reactions  .  Penicillins Anaphylaxis  . Quinapril     Angioedema   . Feldene [Piroxicam] Other (See Comments)    Bleeding   . Calcium-Containing Compounds Other (See Comments)    "calcium deposits on skin"  . Celebrex [Celecoxib] Itching  . Daypro [Oxaprozin] Itching    Social History   Socioeconomic History  . Marital status: Married    Spouse name: Not on file  . Number of children: Not on file  . Years of education: Not on file  . Highest education level: Not on file  Occupational History  . Not on file  Tobacco Use  . Smoking status: Former Smoker    Quit date: 12/06/1963    Years since quitting: 57.0  . Smokeless tobacco: Never Used  Vaping Use  . Vaping Use: Never  used  Substance and Sexual Activity  . Alcohol use: No  . Drug use: Never  . Sexual activity: Not on file  Other Topics Concern  . Not on file  Social History Narrative  . Not on file   Social Determinants of Health   Financial Resource Strain: Not on file  Food Insecurity: Not on file  Transportation Needs: Not on file  Physical Activity: Not on file  Stress: Not on file  Social Connections: Not on file  Intimate Partner Violence: Not on file    Family History  Problem Relation Age of Onset  . Diabetes Mother   . Cancer Mother   . Cancer Father     Past Surgical History:  Procedure Laterality Date  . BREAST SURGERY    . CATARACT EXTRACTION    . LEFT AND RIGHT HEART CATHETERIZATION WITH CORONARY ANGIOGRAM N/A 10/23/2014   Procedure: LEFT AND RIGHT HEART CATHETERIZATION WITH CORONARY ANGIOGRAM;  Surgeon: Blane Ohara, MD;  Location[facility] CATH LAB;  Service: Cardiovascular;  Laterality: N/A;  . PERMANENT PACEMAKER INSERTION N/A 10/25/2014   STJ dual chamber PPM implanted by Dr Rayann Heman for SSS  . TONSILLECTOMY AND ADENOIDECTOMY    . TOTAL KNEE ARTHROPLASTY      ROS: Review of Systems Negative except as stated above  PHYSICAL EXAM: BP (!) 146/75 (BP Location: Right Arm, Patient Position: Sitting)   Pulse 60   Ht 4' 9.99" (1.473 m)   Wt 194 lb 12.8 oz (88.4 kg)   SpO2 91%   BMI 40.72 kg/m   Physical Exam HENT:     Right Ear: Tympanic membrane, ear canal and external ear normal.     Left Ear: Tympanic membrane, ear canal and external ear normal.  Eyes:     Extraocular Movements: Extraocular movements intact.     Conjunctiva/sclera: Conjunctivae normal.     Pupils: Pupils are equal, round, and reactive to light.  Cardiovascular:     Rate and Rhythm: Normal rate and regular rhythm.     Pulses: Normal pulses.     Heart sounds: Normal heart sounds.  Pulmonary:     Effort: Pulmonary effort is normal.     Breath sounds: Normal breath sounds.  Musculoskeletal:      Cervical back: Normal range of motion and neck supple.  Neurological:     General: No focal deficit present.     Mental Status: She is alert and oriented to person, place, and time.  Psychiatric:        Mood and Affect: Mood normal.        Behavior: Behavior normal.    ASSESSMENT AND PLAN: 1. Essential hypertension: - Blood pressure relative to goal.  -  Continue  Bisoprolol, Amlodipine, and Furosemide as prescribed. - Counseled on blood pressure goal of less than 140/90, low-sodium, DASH diet, medication compliance, 150 minutes of moderate intensity exercise per week as tolerated. Discussed medication compliance, adverse effects. - Follow-up with primary provider in 3 months or sooner if needed.  2. Clogged ear, left: 3. Clogged ear, right: - Exam unremarkable.   Patient was given the opportunity to ask questions.  Patient verbalized understanding of the plan and was able to repeat key elements of the plan. Patient was given clear instructions to go to Emergency Department or return to medical center if symptoms don't improve, worsen, or new problems develop.The patient verbalized understanding.   Return in about 3 months (around 03/06/2021) for Follow-Up hypertension .  Camillia Herter, NP

## 2020-12-05 NOTE — Progress Notes (Signed)
Hypertension f/u Right ear clogged

## 2020-12-09 NOTE — Progress Notes (Signed)
Remote pacemaker transmission.   

## 2020-12-16 ENCOUNTER — Other Ambulatory Visit: Payer: Self-pay

## 2020-12-16 ENCOUNTER — Ambulatory Visit: Payer: Medicare Other | Admitting: Pharmacist

## 2020-12-16 DIAGNOSIS — Z7901 Long term (current) use of anticoagulants: Secondary | ICD-10-CM | POA: Diagnosis not present

## 2020-12-16 DIAGNOSIS — I480 Paroxysmal atrial fibrillation: Secondary | ICD-10-CM | POA: Diagnosis not present

## 2020-12-16 LAB — POCT INR: INR: 3.1 — AB (ref 2.0–3.0)

## 2020-12-16 NOTE — Patient Instructions (Signed)
Description   Only take 1 tablet tonight and then continue taking Warfarin 1 tablet daily except 1.5 tablets on Tuesdays, Thursdays, and Saturdays. Eating 2 serving of greens per week plus your one day of brussels sprouts. Recheck INR 4 weeks. Coumadin Clinic [phone]

## 2021-01-08 DIAGNOSIS — H40023 Open angle with borderline findings, high risk, bilateral: Secondary | ICD-10-CM | POA: Diagnosis not present

## 2021-01-08 DIAGNOSIS — E113291 Type 2 diabetes mellitus with mild nonproliferative diabetic retinopathy without macular edema, right eye: Secondary | ICD-10-CM | POA: Diagnosis not present

## 2021-01-08 DIAGNOSIS — H1045 Other chronic allergic conjunctivitis: Secondary | ICD-10-CM | POA: Diagnosis not present

## 2021-01-08 DIAGNOSIS — H04212 Epiphora due to excess lacrimation, left lacrimal gland: Secondary | ICD-10-CM | POA: Diagnosis not present

## 2021-01-13 ENCOUNTER — Ambulatory Visit: Payer: Medicare Other | Admitting: *Deleted

## 2021-01-13 ENCOUNTER — Other Ambulatory Visit: Payer: Self-pay

## 2021-01-13 DIAGNOSIS — I480 Paroxysmal atrial fibrillation: Secondary | ICD-10-CM | POA: Diagnosis not present

## 2021-01-13 DIAGNOSIS — Z7901 Long term (current) use of anticoagulants: Secondary | ICD-10-CM | POA: Diagnosis not present

## 2021-01-13 LAB — POCT INR: INR: 2.6 (ref 2.0–3.0)

## 2021-01-13 NOTE — Patient Instructions (Signed)
Description   Continue taking Warfarin 1 tablet daily except 1.5 tablets on Tuesdays, Thursdays, and Saturdays. Eating 2 serving of greens per week plus your one day of brussels sprouts. Recheck INR 4 weeks. Coumadin Clinic [phone]

## 2021-01-14 ENCOUNTER — Other Ambulatory Visit: Payer: Self-pay | Admitting: Internal Medicine

## 2021-01-14 DIAGNOSIS — Z7901 Long term (current) use of anticoagulants: Secondary | ICD-10-CM

## 2021-01-14 DIAGNOSIS — I480 Paroxysmal atrial fibrillation: Secondary | ICD-10-CM

## 2021-02-04 ENCOUNTER — Ambulatory Visit (INDEPENDENT_AMBULATORY_CARE_PROVIDER_SITE_OTHER): Payer: Medicare Other | Admitting: Podiatry

## 2021-02-04 ENCOUNTER — Encounter: Payer: Self-pay | Admitting: Podiatry

## 2021-02-04 ENCOUNTER — Other Ambulatory Visit: Payer: Self-pay

## 2021-02-04 DIAGNOSIS — E1142 Type 2 diabetes mellitus with diabetic polyneuropathy: Secondary | ICD-10-CM | POA: Diagnosis not present

## 2021-02-04 DIAGNOSIS — E1121 Type 2 diabetes mellitus with diabetic nephropathy: Secondary | ICD-10-CM

## 2021-02-04 DIAGNOSIS — M79676 Pain in unspecified toe(s): Secondary | ICD-10-CM

## 2021-02-04 DIAGNOSIS — B351 Tinea unguium: Secondary | ICD-10-CM | POA: Diagnosis not present

## 2021-02-04 DIAGNOSIS — D689 Coagulation defect, unspecified: Secondary | ICD-10-CM | POA: Diagnosis not present

## 2021-02-04 NOTE — Progress Notes (Signed)
This patient returns to my office for at risk foot care.  This patient requires this care by a professional since this patient will be at risk due to having diabetes and coagulation defect.  Patient is taking coumadin.  This patient is unable to cut nails herself since the patient cannot reach her nails.These nails are painful walking and wearing shoes.  This patient presents for at risk foot care today.  General Appearance  Alert, conversant and in no acute stress.  Vascular  Dorsalis pedis and posterior tibial  pulses are weakly  palpable  bilaterally.  Capillary return is within normal limits  bilaterally. Temperature is within normal limits  Bilaterally. Absent hair.  Neurologic  Senn-Weinstein monofilament wire test within normal limits  bilaterally. Muscle power within normal limits bilaterally.  Nails Thick disfigured discolored nails with subungual debris  from hallux to fifth toes bilaterally. No evidence of bacterial infection or drainage bilaterally.  Orthopedic  No limitations of motion  feet .  No crepitus or effusions noted.  No bony pathology or digital deformities noted.  Skin  normotropic skin with no porokeratosis noted bilaterally.  No signs of infections or ulcers noted.     Onychomycosis  Pain in right toes  Pain in left toes  Consent was obtained for treatment procedures.   Mechanical debridement of nails 1-5  bilaterally performed with a nail nipper. No dremel usage.   Return office visit   3 months                   Told patient to return for periodic foot care and evaluation due to potential at risk complications.   Helane Gunther DPM

## 2021-02-10 ENCOUNTER — Ambulatory Visit (INDEPENDENT_AMBULATORY_CARE_PROVIDER_SITE_OTHER): Payer: Medicare Other | Admitting: *Deleted

## 2021-02-10 ENCOUNTER — Other Ambulatory Visit: Payer: Self-pay

## 2021-02-10 DIAGNOSIS — Z7901 Long term (current) use of anticoagulants: Secondary | ICD-10-CM

## 2021-02-10 DIAGNOSIS — I480 Paroxysmal atrial fibrillation: Secondary | ICD-10-CM | POA: Diagnosis not present

## 2021-02-10 LAB — POCT INR: INR: 3.1 — AB (ref 2.0–3.0)

## 2021-02-10 NOTE — Patient Instructions (Signed)
Description   Today take 1 tablet then continue taking Warfarin 1 tablet daily except 1.5 tablets on Tuesdays, Thursdays, and Saturdays. Eating 2 serving of greens per week plus your one day of brussels sprouts. Recheck INR 4 weeks. Coumadin Clinic [phone]

## 2021-02-16 ENCOUNTER — Other Ambulatory Visit: Payer: Self-pay | Admitting: Family

## 2021-02-16 DIAGNOSIS — M7989 Other specified soft tissue disorders: Secondary | ICD-10-CM

## 2021-02-16 DIAGNOSIS — I10 Essential (primary) hypertension: Secondary | ICD-10-CM

## 2021-02-26 ENCOUNTER — Ambulatory Visit (INDEPENDENT_AMBULATORY_CARE_PROVIDER_SITE_OTHER): Payer: Medicare Other

## 2021-02-26 DIAGNOSIS — I495 Sick sinus syndrome: Secondary | ICD-10-CM

## 2021-02-26 LAB — CUP PACEART REMOTE DEVICE CHECK
Battery Remaining Longevity: 43 mo
Battery Remaining Percentage: 39 %
Battery Voltage: 2.96 V
Brady Statistic AP VP Percent: 99 %
Brady Statistic AP VS Percent: 1 %
Brady Statistic AS VP Percent: 1 %
Brady Statistic AS VS Percent: 1 %
Brady Statistic RA Percent Paced: 98 %
Brady Statistic RV Percent Paced: 99 %
Date Time Interrogation Session: 20220630023914
Implantable Lead Implant Date: 20160226
Implantable Lead Implant Date: 20160226
Implantable Lead Location: 753859
Implantable Lead Location: 753860
Implantable Lead Model: 1948
Implantable Pulse Generator Implant Date: 20160226
Lead Channel Impedance Value: 390 Ohm
Lead Channel Impedance Value: 550 Ohm
Lead Channel Pacing Threshold Amplitude: 0.5 V
Lead Channel Pacing Threshold Amplitude: 1 V
Lead Channel Pacing Threshold Pulse Width: 0.4 ms
Lead Channel Pacing Threshold Pulse Width: 0.4 ms
Lead Channel Sensing Intrinsic Amplitude: 3.2 mV
Lead Channel Sensing Intrinsic Amplitude: 8.8 mV
Lead Channel Setting Pacing Amplitude: 1.5 V
Lead Channel Setting Pacing Amplitude: 2.5 V
Lead Channel Setting Pacing Pulse Width: 0.4 ms
Lead Channel Setting Sensing Sensitivity: 2 mV
Pulse Gen Model: 2240
Pulse Gen Serial Number: 7700661

## 2021-03-02 ENCOUNTER — Other Ambulatory Visit: Payer: Self-pay | Admitting: Family

## 2021-03-02 DIAGNOSIS — I10 Essential (primary) hypertension: Secondary | ICD-10-CM

## 2021-03-04 NOTE — Progress Notes (Signed)
Patient ID: SRINIDHI LANDERS, female    DOB: October 24, 1934  MRN: [id number]  CC: Hypertension and Diabetes Follow-Up   Subjective: Tracey Todd is a 86 y.o. female who presents for hypertension and diabetes follow-up.   Her concerns today include:   HYPERTENSION FOLLOW-UP: 12/05/2020: - Blood pressure relative to goal. - Continue Bisoprolol, Amlodipine, and Furosemide as prescribed. - Follow-up with primary provider in 3 months or sooner if needed.  03/06/2021: Doing well on current regimen. No side effects. No issues/concerns. Denies chest pain and shortness of breath.   2. DIABETES TYPE 2 FOLLOW-UP: 11/26/2020: - Continue Metformin as prescribed.  - Hemoglobin A1c to screen for level of diabetes control. - Follow-up with primary provider in 3 months or sooner if needed.   03/06/2021: Doing well on current regimen. No side effects. No issues/concerns. She is checking blood sugars at home ranges 130's-170's.  3. LEG SWELLING FOLLOW-UP: 11/26/2020: - Continue Furosemide as prescribed. - Follow-up with primary provider as scheduled.   03/06/2021: Reports caring for her husband in the home is causing swelling to worsen some days. Not using compression stockings on a consistent basis because she is unable to reach down to her feet. Considering purchasing compression stockings with zipper sides. Unable to elevate legs as much as she would like to.  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 15 tablet 2   Blood Glucose Monitoring Suppl (ONETOUCH VERIO) w/Device KIT Use to check fasting blood sugar daily. Dx: E11.59 1 kit 0   calcium carbonate (OS-CAL) 600 MG TABS tablet Take 600 mg by mouth daily with breakfast.     cetirizine (ZYRTEC) 10 MG tablet Take 10 mg by mouth every other day.      Cholecalciferol (VITAMIN D) 2000 UNITS CAPS Take 6,000 Units by mouth daily.      glucose blood (ONETOUCH VERIO) test strip Use to check fasting blood sugar daily. Dx: E11.59 100 each 2   Magnesium 500 MG TABS Take 500 mg by mouth 4 (four) times daily.      OneTouch Delica Lancets 00F MISC Use to check fasting blood sugar daily. Dx: E11.59 100 each 2   warfarin (COUMADIN) 5 MG tablet TAKE AS DIRECTED BY  COUMADIN  CLINIC 40 tablet 3   No current facility-administered medications on file prior to visit.    Allergies  Allergen Reactions   Penicillins Anaphylaxis   Quinapril     Angioedema    Feldene [Piroxicam] Other (See Comments)    Bleeding    Calcium-Containing Compounds Other (See Comments)    "calcium deposits on  skin"   Celebrex [Celecoxib] Itching   Daypro [Oxaprozin] Itching    Social History   Socioeconomic History   Marital status: Married    Spouse name: Not on file   Number of children: Not on file   Years of education: Not on file   Highest education level: Not on file  Occupational History   Not on file  Tobacco Use   Smoking status: Former    Pack years: 0.00    Types: Cigarettes    Quit date: 12/06/1963    Years since quitting: 57.2   Smokeless tobacco: Never  Vaping  Use   Vaping Use: Never used  Substance and Sexual Activity   Alcohol use: No   Drug use: Never   Sexual activity: Not on file  Other Topics Concern   Not on file  Social History Narrative   Not on file   Social Determinants of Health   Financial Resource Strain: Not on file  Food Insecurity: Not on file  Transportation Needs: Not on file  Physical Activity: Not on file  Stress: Not on file  Social Connections: Not on file  Intimate Partner Violence: Not on file    Family History  Problem Relation Age of Onset   Diabetes Mother    Cancer Mother    Cancer Father     Past Surgical History:  Procedure Laterality Date   BREAST SURGERY     CATARACT EXTRACTION     LEFT AND RIGHT HEART CATHETERIZATION WITH CORONARY ANGIOGRAM N/A 10/23/2014   Procedure: LEFT AND RIGHT HEART CATHETERIZATION WITH CORONARY ANGIOGRAM;  Surgeon: Micheline Chapman, MD;  Location: [facility] CATH LAB;  Service: Cardiovascular;  Laterality: N/A;   PERMANENT PACEMAKER INSERTION N/A 10/25/2014   STJ dual chamber PPM implanted by Dr [name] for SSS   TONSILLECTOMY AND ADENOIDECTOMY     TOTAL KNEE ARTHROPLASTY      ROS: Review of Systems Negative except as stated above  PHYSICAL EXAM: BP (!) 144/72 (BP Location: Right Arm, Patient Position: Sitting, Cuff Size: Large)   Pulse 64   Temp 98.2 F (36.8 C)   Resp 16   Ht 4' 9.99" (1.473 m)   Wt 199 lb 12.8 oz (90.6 kg)   SpO2 92%   BMI 41.77 kg/m   Physical Exam HENT:     Head: Normocephalic and atraumatic.  Eyes:     Extraocular Movements: Extraocular movements intact.     Conjunctiva/sclera: Conjunctivae normal.     Pupils: Pupils are equal, round, and reactive to light.  Cardiovascular:     Rate and Rhythm: Normal rate and regular rhythm.     Pulses: Normal pulses.     Heart sounds: Normal heart sounds.  Pulmonary:     Effort: Pulmonary effort is normal.     Breath sounds: Normal breath sounds.  Musculoskeletal:     Cervical back: Normal range  of motion and neck supple.     Comments: Mild pitting edema 1+ bilateral lower extremities.   Neurological:     General: No focal deficit present.     Mental Status: She is alert and oriented to person, place, and time.  Psychiatric:        Mood and Affect: Mood normal.        Behavior: Behavior normal.    Results for orders placed or performed in visit on 03/06/21  POCT glycosylated hemoglobin (Hb A1C)  Result Value Ref Range   Hemoglobin A1C 7.7 (A) 4.0 - 5.6 %  HbA1c POC (<> result, manual entry)     HbA1c, POC (prediabetic range)     HbA1c, POC (controlled diabetic range)      ASSESSMENT AND PLAN: 1. Essential hypertension: - Blood pressure close to goal today.  - Continue Amlodipine, Bisoprolol, and Furosemide as prescribed.  - Counseled on blood pressure goal of less than 140/90, low-sodium, DASH diet, medication compliance, 150 minutes of moderate intensity exercise per week as tolerated. Discussed medication compliance, adverse effects. - BMP to evaluate kidney function and electrolyte balance. - Follow-up with primary provider in 3 months or sooner if needed.  - Basic Metabolic Panel - amLODipine (NORVASC) 5 MG tablet; Take 1 tablet (5 mg total) by mouth daily.  Dispense: 90 tablet; Refill: 0 - bisoprolol (ZEBETA) 10 MG tablet; Take 1 tablet (10 mg total) by mouth daily for 90 doses. for blood pressure  Dispense: 90 tablet; Refill: 0 - furosemide (LASIX) 40 MG tablet; Take 1 tablet (40 mg total) by mouth daily.  Dispense: 90 tablet; Refill: 0  2. Type 2 diabetes mellitus with other circulatory complication, unspecified whether long term insulin use ([place]): - Hemoglobin A1c at goal today at 7.7%, goal < 8%. This is improved from previous hemoglobin A1c of 7.9% on 11/26/2020. Next hemoglobin A1c due October 2022.  - Continue Metformin as prescribed.  - Discussed the importance of healthy eating habits, low-carbohydrate diet, low-sugar diet, regular aerobic exercise as  tolerated, and medication compliance to achieve or maintain control of diabetes. - Follow-up with primary provider in 3 months or sooner if needed.  - POCT glycosylated hemoglobin (Hb A1C) - metFORMIN (GLUCOPHAGE XR) 500 MG 24 hr tablet; Take 1 tablet (500 mg total) by mouth in the morning and at bedtime. Needs office visit for additional refills.  Dispense: 180 tablet; Refill: 0  3. Leg swelling: - Continue Furosemide as prescribed.  - Follow-up with primary provider as scheduled.  - furosemide (LASIX) 40 MG tablet; Take 1 tablet (40 mg total) by mouth daily.  Dispense: 90 tablet; Refill: 0  4. Hypokalemia: - Continue Potassium Chloride as prescribed.  - BMP to evaluate kidney function and electrolyte balance. - Follow-up with primary provider as scheduled.  - potassium chloride SA (KLOR-CON) 20 MEQ tablet; Take 1 tablet (20 mEq total) by mouth daily.  Dispense: 90 tablet; Refill: 0 - Basic Metabolic Panel  Patient was given the opportunity to ask questions.  Patient verbalized understanding of the plan and was able to repeat key elements of the plan. Patient was given clear instructions to go to Emergency Department or return to medical center if symptoms don't improve, worsen, or new problems develop.The patient verbalized understanding.   Orders Placed This Encounter  Procedures   Basic Metabolic Panel   POCT glycosylated hemoglobin (Hb A1C)     Requested Prescriptions   Signed Prescriptions Disp Refills   amLODipine (NORVASC) 5 MG tablet 90 tablet 0    Sig: Take 1 tablet (5 mg total) by mouth daily.   bisoprolol (ZEBETA) 10 MG tablet 90 tablet 0    Sig: Take 1 tablet (10 mg total) by mouth daily for 90 doses. for blood pressure   furosemide (LASIX) 40 MG tablet 90 tablet 0    Sig: Take 1 tablet (40 mg total) by mouth daily.   potassium chloride SA (KLOR-CON) 20 MEQ tablet 90 tablet 0    Sig: Take 1 tablet (20 mEq total) by mouth daily.   metFORMIN (GLUCOPHAGE XR) 500 MG 24  hr tablet 180  tablet 0    Sig: Take 1 tablet (500 mg total) by mouth in the morning and at bedtime. Needs office visit for additional refills.    Return in about 3 months (around 06/06/2021) for Follow-Up hypertension and diabetes.  Camillia Herter, NP

## 2021-03-06 ENCOUNTER — Ambulatory Visit (INDEPENDENT_AMBULATORY_CARE_PROVIDER_SITE_OTHER): Payer: Medicare Other | Admitting: Family

## 2021-03-06 ENCOUNTER — Other Ambulatory Visit: Payer: Self-pay

## 2021-03-06 VITALS — BP 144/72 | HR 64 | Temp 98.2°F | Resp 16 | Ht <= 58 in | Wt 199.8 lb

## 2021-03-06 DIAGNOSIS — E1159 Type 2 diabetes mellitus with other circulatory complications: Secondary | ICD-10-CM | POA: Diagnosis not present

## 2021-03-06 DIAGNOSIS — I10 Essential (primary) hypertension: Secondary | ICD-10-CM | POA: Diagnosis not present

## 2021-03-06 DIAGNOSIS — M7989 Other specified soft tissue disorders: Secondary | ICD-10-CM

## 2021-03-06 DIAGNOSIS — E876 Hypokalemia: Secondary | ICD-10-CM | POA: Diagnosis not present

## 2021-03-06 LAB — POCT GLYCOSYLATED HEMOGLOBIN (HGB A1C): Hemoglobin A1C: 7.7 % — AB (ref 4.0–5.6)

## 2021-03-06 MED ORDER — FUROSEMIDE 40 MG PO TABS: 40.0000 mg | ORAL_TABLET | Freq: Every day | ORAL | 0 refills | Status: DC

## 2021-03-06 MED ORDER — BISOPROLOL FUMARATE 10 MG PO TABS: 10.0000 mg | ORAL_TABLET | Freq: Every day | ORAL | 0 refills | Status: DC

## 2021-03-06 MED ORDER — AMLODIPINE BESYLATE 5 MG PO TABS: 5.0000 mg | ORAL_TABLET | Freq: Every day | ORAL | 0 refills | Status: DC

## 2021-03-06 MED ORDER — POTASSIUM CHLORIDE CRYS ER 20 MEQ PO TBCR: 20.0000 meq | EXTENDED_RELEASE_TABLET | Freq: Every day | ORAL | 0 refills | Status: DC

## 2021-03-06 MED ORDER — METFORMIN HCL ER 500 MG PO TB24: 500.0000 mg | ORAL_TABLET | Freq: Two times a day (BID) | ORAL | 0 refills | Status: DC

## 2021-03-06 NOTE — Progress Notes (Signed)
Pt presents for hypertension and diabetes f/u. Pt need refills on bisoprolol, furosemide Metformin and Klor-Con

## 2021-03-07 LAB — BASIC METABOLIC PANEL
BUN/Creatinine Ratio: 26 (ref 12–28)
BUN: 17 mg/dL (ref 8–27)
CO2: 24 mmol/L (ref 20–29)
Calcium: 9.2 mg/dL (ref 8.7–10.3)
Chloride: 100 mmol/L (ref 96–106)
Creatinine, Ser: 0.65 mg/dL (ref 0.57–1.00)
Glucose: 138 mg/dL — ABNORMAL HIGH (ref 65–99)
Potassium: 4.4 mmol/L (ref 3.5–5.2)
Sodium: 138 mmol/L (ref 134–144)
eGFR: 87 mL/min/{1.73_m2} (ref >59)

## 2021-03-07 NOTE — Progress Notes (Signed)
Please call patient with update.   Kidney function normal.   Diabetes discussed in office.

## 2021-03-09 ENCOUNTER — Telehealth: Payer: Self-pay | Admitting: Internal Medicine

## 2021-03-09 ENCOUNTER — Other Ambulatory Visit: Payer: Self-pay

## 2021-03-09 DIAGNOSIS — E1159 Type 2 diabetes mellitus with other circulatory complications: Secondary | ICD-10-CM

## 2021-03-09 MED ORDER — ONETOUCH DELICA LANCETS 33G MISC: 2 refills | Status: DC

## 2021-03-09 MED ORDER — ONETOUCH VERIO VI STRP: ORAL_STRIP | 2 refills | Status: DC

## 2021-03-09 NOTE — Telephone Encounter (Signed)
Patient called in stating she was seen on 03/06/2021 and is needing  glucose blood (ONETOUCH VERIO) test strip  OneTouch Delica Lancets 33G MISC     Pharmacy  [facility] [address] [place], [place] - [address]  [address] [place] [address]  Phone:  [phone]  Fax:  [phone]

## 2021-03-10 ENCOUNTER — Other Ambulatory Visit: Payer: Self-pay

## 2021-03-10 ENCOUNTER — Ambulatory Visit: Payer: Medicare Other | Admitting: *Deleted

## 2021-03-10 DIAGNOSIS — I480 Paroxysmal atrial fibrillation: Secondary | ICD-10-CM

## 2021-03-10 DIAGNOSIS — Z7901 Long term (current) use of anticoagulants: Secondary | ICD-10-CM | POA: Diagnosis not present

## 2021-03-10 LAB — POCT INR: INR: 3.1 — AB (ref 2.0–3.0)

## 2021-03-10 NOTE — Patient Instructions (Signed)
Description   Today take 1 tablet then start taking Warfarin 1 tablet daily except 1.5 tablets on Tuesdays, and Saturdays. Eating 2 serving of greens per week plus your one day of brussels sprouts. Recheck INR 3 weeks. Coumadin Clinic [phone]

## 2021-03-17 NOTE — Progress Notes (Signed)
Remote pacemaker transmission.   

## 2021-03-30 ENCOUNTER — Other Ambulatory Visit: Payer: Self-pay | Admitting: Internal Medicine

## 2021-03-30 DIAGNOSIS — Z7901 Long term (current) use of anticoagulants: Secondary | ICD-10-CM

## 2021-03-30 DIAGNOSIS — I480 Paroxysmal atrial fibrillation: Secondary | ICD-10-CM

## 2021-03-30 NOTE — Telephone Encounter (Signed)
Prescription refill request received for warfarin Lov:  se[place], 04/24/2020 Next INR check: 8/2 Warfarin tablet strength: 5mg 

## 2021-03-31 ENCOUNTER — Ambulatory Visit: Payer: Medicare Other | Admitting: *Deleted

## 2021-03-31 ENCOUNTER — Other Ambulatory Visit: Payer: Self-pay

## 2021-03-31 DIAGNOSIS — Z5181 Encounter for therapeutic drug level monitoring: Secondary | ICD-10-CM | POA: Diagnosis not present

## 2021-03-31 DIAGNOSIS — Z7901 Long term (current) use of anticoagulants: Secondary | ICD-10-CM

## 2021-03-31 DIAGNOSIS — I480 Paroxysmal atrial fibrillation: Secondary | ICD-10-CM

## 2021-03-31 LAB — POCT INR: INR: 2.9 (ref 2.0–3.0)

## 2021-03-31 NOTE — Patient Instructions (Signed)
Description   Continue taking Warfarin 1 tablet daily except 1.5 tablets on Tuesdays, and Saturdays. Eating 2 serving of greens per week plus your one day of brussels sprouts. Recheck INR 4 weeks. Coumadin Clinic [phone]

## 2021-04-25 ENCOUNTER — Telehealth: Payer: Self-pay | Admitting: Internal Medicine

## 2021-04-25 NOTE — Telephone Encounter (Signed)
Contacted patient in attempt to schedule virtual medicare wellness. Patient declined appointment stating she already has enough appointments with her doctor and is doing fine. AS, CMA

## 2021-04-26 NOTE — Progress Notes (Signed)
Electrophysiology Office Note Date: 04/27/2021  ID:  Tracey Todd, Tracey Todd 01/08/1935, MRN [id number]  PCP: Arvilla Market, MD Primary Cardiologist: Hillis Range, MD Electrophysiologist: Hillis Range, MD   CC: Pacemaker follow-up  Tracey Todd is a 86 y.o. female seen today for Hillis Range, MD for routine electrophysiology followup.  Since last being seen in our clinic the patient reports doing very well.  she denies chest pain, palpitations, dyspnea, PND, orthopnea, nausea, vomiting, dizziness, syncope, edema, weight gain, or early satiety.  Device History: STJ dual chamber PPM implanted 2016 for SSS  Past Medical History:  Diagnosis Date   Atrial flutter (HCC)    a. mentioned in 2016 admission.   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 minimal by cath 2016.   Nodule of chest wall    Normal coronary arteries and LVF 10/23/14 10/24/2014   Pacemaker implanted 10/25/14- St Jude 10/26/2014   Paroxysmal atrial fibrillation (HCC)    a. identified on device interrogation, burden low   Pulmonary hypertension (HCC)    Sick sinus syndrome (HCC)    a. s/p STJ dual chamber PPM    Type II or unspecified type diabetes mellitus without mention of complication, not stated as uncontrolled    Vitamin D deficiency    Past Surgical History:  Procedure Laterality Date   BREAST SURGERY     CATARACT EXTRACTION     LEFT AND RIGHT HEART CATHETERIZATION WITH CORONARY ANGIOGRAM N/A 10/23/2014   Procedure: LEFT AND RIGHT HEART CATHETERIZATION WITH CORONARY ANGIOGRAM;  Surgeon: Micheline Chapman, MD;  Location: [facility] CATH LAB;  Service: Cardiovascular;  Laterality: N/A;   PERMANENT PACEMAKER INSERTION N/A 10/25/2014   STJ dual chamber PPM implanted by Dr [name] for SSS   TONSILLECTOMY AND ADENOIDECTOMY     TOTAL KNEE  ARTHROPLASTY      Current Outpatient Medications  Medication Sig Dispense Refill   amLODipine (NORVASC) 5 MG tablet Take 1 tablet (5 mg total) by mouth daily. 90 tablet 0   atorvastatin (LIPITOR) 80 MG tablet Take 0.5 tablets (40 mg total) by mouth daily. 15 tablet 2   bisoprolol (ZEBETA) 10 MG tablet Take 1 tablet (10 mg total) by mouth daily for 90 doses. for blood pressure 90 tablet 0   Blood Glucose Monitoring Suppl (ONETOUCH VERIO) w/Device KIT Use to check fasting blood sugar daily. Dx: E11.59 1 kit 0   calcium carbonate (OS-CAL) 600 MG TABS tablet Take 600 mg by mouth daily with breakfast.     cetirizine (ZYRTEC) 10 MG tablet Take 10 mg by mouth every other day.      Cholecalciferol (VITAMIN D) 2000 UNITS CAPS Take 6,000 Units by mouth daily.      furosemide (LASIX) 40 MG tablet Take 1 tablet (40 mg total) by mouth daily. 90 tablet 0   glucose blood (ONETOUCH VERIO) test strip Use to check fasting blood sugar daily. Dx: E11.59 100 each 2   Magnesium 500 MG TABS Take 500 mg by mouth 4 (four) times daily.      metFORMIN (GLUCOPHAGE XR) 500 MG 24 hr tablet Take 1 tablet (500 mg total) by mouth in the morning and at bedtime. Needs office visit for additional refills. 180 tablet 0   OneTouch Delica Lancets 33G MISC Use to  check fasting blood sugar daily. Dx: E11.59 100 each 2   potassium chloride SA (KLOR-CON) 20 MEQ tablet Take 1 tablet (20 mEq total) by mouth daily. 90 tablet 0   warfarin (COUMADIN) 5 MG tablet TAKE AS DIRECTED BY  COUMADIN  CLINIC 40 tablet 2   No current facility-administered medications for this visit.    Allergies:   Penicillins, Quinapril, Feldene [piroxicam], Calcium-containing compounds, Celebrex [celecoxib], and Daypro [oxaprozin]   Social History: Social History   Socioeconomic History   Marital status: Married    Spouse name: Not on file   Number of children: Not on file   Years of education: Not on file   Highest education level: Not on file   Occupational History   Not on file  Tobacco Use   Smoking status: Former    Types: Cigarettes    Quit date: 12/06/1963    Years since quitting: 57.4   Smokeless tobacco: Never  Vaping Use   Vaping Use: Never used  Substance and Sexual Activity   Alcohol use: No   Drug use: Never   Sexual activity: Not on file  Other Topics Concern   Not on file  Social History Narrative   Not on file   Social Determinants of Health   Financial Resource Strain: Not on file  Food Insecurity: Not on file  Transportation Needs: Not on file  Physical Activity: Not on file  Stress: Not on file  Social Connections: Not on file  Intimate Partner Violence: Not on file    Family History: Family History  Problem Relation Age of Onset   Diabetes Mother    Cancer Mother    Cancer Father      Review of Systems: All other systems reviewed and are otherwise negative except as noted above.  Physical Exam: Vitals:   04/27/21 0810  BP: (!) 146/80  Pulse: 63  SpO2: 95%  Weight: 203 lb 6.4 oz (92.3 kg)  Height: $Remove'4\' 10"'fHaRiNR$  (1.473 m)     GEN- The patient is well appearing, alert and oriented x 3 today.   HEENT: normocephalic, atraumatic; sclera clear, conjunctiva pink; hearing intact; oropharynx clear; neck supple  Lungs- Clear to ausculation bilaterally, normal work of breathing.  No wheezes, rales, rhonchi Heart- Regular rate and rhythm, no murmurs, rubs or gallops  GI- soft, non-tender, non-distended, bowel sounds present  Extremities- no clubbing or cyanosis. No edema MS- no significant deformity or atrophy Skin- warm and dry, no rash or lesion; PPM pocket well healed Psych- euthymic mood, full affect Neuro- strength and sensation are intact  PPM Interrogation- reviewed in detail today,  See PACEART report  EKG:  EKG is ordered today. Personal review of ekg ordered today shows A-V dual paced at 63 bpm   Recent Labs: 08/18/2020: Hemoglobin 12.1; Platelets 309 03/06/2021: BUN 17;  Creatinine, Ser 0.65; Potassium 4.4; Sodium 138   Wt Readings from Last 3 Encounters:  04/27/21 203 lb 6.4 oz (92.3 kg)  03/06/21 199 lb 12.8 oz (90.6 kg)  12/05/20 194 lb 12.8 oz (88.4 kg)     Other studies Reviewed: Additional studies/ records that were reviewed today include: Previous EP office notes, Previous remote checks, Most recent labwork.   Assessment and Plan:  1. Symptomatic bradycardia s/p St. Jude PPM  Normal PPM function See Pace Art report No changes today  2.  Persistent atrial fibrillation/ atrial flutter Burden by device interrogation ~1.3% She is asymptomatic If recurrence, would not pursue rhythm control as long as rates  are controlled Continue coumadin for CHA2DS2-VASc of at least 5.    3.  Obesity Body mass index is 42.51 kg/m.    4.  HTN Stable on current regimen  Current medicines are reviewed at length with the patient today.   The patient does not have concerns regarding her medicines.  The following changes were made today:  none  Labs/ tests ordered today include:  Orders Placed This Encounter  Procedures   EKG 12-Lead    Disposition:   Follow up with EP APP annually.   Jacalyn Lefevre, PA-C  04/27/2021 8:23 AM  [place] [address] [place] [address] [phone] (office) [phone] (fax)

## 2021-04-27 ENCOUNTER — Other Ambulatory Visit: Payer: Self-pay

## 2021-04-27 ENCOUNTER — Ambulatory Visit: Payer: Medicare Other

## 2021-04-27 ENCOUNTER — Ambulatory Visit (INDEPENDENT_AMBULATORY_CARE_PROVIDER_SITE_OTHER): Payer: Medicare Other | Admitting: Student

## 2021-04-27 VITALS — BP 146/80 | HR 63 | Ht <= 58 in | Wt 203.4 lb

## 2021-04-27 DIAGNOSIS — I495 Sick sinus syndrome: Secondary | ICD-10-CM | POA: Diagnosis not present

## 2021-04-27 DIAGNOSIS — I4819 Other persistent atrial fibrillation: Secondary | ICD-10-CM | POA: Diagnosis not present

## 2021-04-27 DIAGNOSIS — Z7901 Long term (current) use of anticoagulants: Secondary | ICD-10-CM

## 2021-04-27 DIAGNOSIS — Z5181 Encounter for therapeutic drug level monitoring: Secondary | ICD-10-CM

## 2021-04-27 DIAGNOSIS — I480 Paroxysmal atrial fibrillation: Secondary | ICD-10-CM

## 2021-04-27 DIAGNOSIS — I10 Essential (primary) hypertension: Secondary | ICD-10-CM

## 2021-04-27 LAB — CUP PACEART INCLINIC DEVICE CHECK
Battery Remaining Longevity: 38 mo
Battery Voltage: 2.96 V
Brady Statistic RA Percent Paced: 98 %
Brady Statistic RV Percent Paced: 99.42 %
Date Time Interrogation Session: 20220829082100
Implantable Lead Implant Date: 20160226
Implantable Lead Implant Date: 20160226
Implantable Lead Location: 753859
Implantable Lead Location: 753860
Implantable Lead Model: 1948
Implantable Pulse Generator Implant Date: 20160226
Lead Channel Impedance Value: 387.5 Ohm
Lead Channel Impedance Value: 550 Ohm
Lead Channel Pacing Threshold Amplitude: 0.625 V
Lead Channel Pacing Threshold Amplitude: 0.75 V
Lead Channel Pacing Threshold Amplitude: 0.75 V
Lead Channel Pacing Threshold Pulse Width: 0.4 ms
Lead Channel Pacing Threshold Pulse Width: 0.4 ms
Lead Channel Pacing Threshold Pulse Width: 0.4 ms
Lead Channel Sensing Intrinsic Amplitude: 3.2 mV
Lead Channel Sensing Intrinsic Amplitude: 9.2 mV
Lead Channel Setting Pacing Amplitude: 1.625
Lead Channel Setting Pacing Amplitude: 2.5 V
Lead Channel Setting Pacing Pulse Width: 0.4 ms
Lead Channel Setting Sensing Sensitivity: 2 mV
Pulse Gen Model: 2240
Pulse Gen Serial Number: 7700661

## 2021-04-27 LAB — POCT INR: INR: 2.9 (ref 2.0–3.0)

## 2021-04-27 NOTE — Patient Instructions (Signed)
Continue taking Warfarin 1 tablet daily except 1.5 tablets on Tuesdays, and Saturdays. Eating 2 serving of greens per week plus your one day of brussels sprouts. Recheck INR 5 weeks. Coumadin Clinic [phone]

## 2021-04-27 NOTE — Patient Instructions (Signed)
Medication Instructions:  Your physician recommends that you continue on your current medications as directed. Please refer to the Current Medication list given to you today.  *If you need a refill on your cardiac medications before your next appointment, please call your pharmacy*   Lab Work: None If you have labs (blood work) drawn today and your tests are completely normal, you will receive your results only by: MyChart Message (if you have MyChart) OR A paper copy in the mail If you have any lab test that is abnormal or we need to change your treatment, we will call you to review the results.   Follow-Up: At [facility], you and your health needs are our priority.  As part of our continuing mission to provide you with exceptional heart care, we have created designated Provider Care Teams.  These Care Teams include your primary Cardiologist (physician) and Advanced Practice Providers (APPs -  Physician Assistants and Nurse Practitioners) who all work together to provide you with the care you need, when you need it.  We recommend signing up for the patient portal called "MyChart".  Sign up information is provided on this After Visit Summary.  MyChart is used to connect with patients for Virtual Visits (Telemedicine).  Patients are able to view lab/test results, encounter notes, upcoming appointments, etc.  Non-urgent messages can be sent to your provider as well.   To learn more about what you can do with MyChart, go to [email or web address].    Your next appointment:   1 year(s)  The format for your next appointment:   In Person  Provider:   You may see James Allred, MD or one of the following Advanced Practice Providers on your designated Care Team:   Renee Ursuy, PA-C Michael "Andy" Tillery, PA-C    

## 2021-05-08 ENCOUNTER — Encounter: Payer: Self-pay | Admitting: Podiatry

## 2021-05-08 ENCOUNTER — Ambulatory Visit (INDEPENDENT_AMBULATORY_CARE_PROVIDER_SITE_OTHER): Payer: Medicare Other | Admitting: Podiatry

## 2021-05-08 ENCOUNTER — Other Ambulatory Visit: Payer: Self-pay

## 2021-05-08 DIAGNOSIS — M79676 Pain in unspecified toe(s): Secondary | ICD-10-CM | POA: Diagnosis not present

## 2021-05-08 DIAGNOSIS — B351 Tinea unguium: Secondary | ICD-10-CM

## 2021-05-08 DIAGNOSIS — E1121 Type 2 diabetes mellitus with diabetic nephropathy: Secondary | ICD-10-CM | POA: Diagnosis not present

## 2021-05-08 DIAGNOSIS — D689 Coagulation defect, unspecified: Secondary | ICD-10-CM | POA: Diagnosis not present

## 2021-05-08 NOTE — Progress Notes (Signed)
This patient returns to my office for at risk foot care.  This patient requires this care by a professional since this patient will be at risk due to having diabetes and coagulation defect.  Patient is taking coumadin.  This patient is unable to cut nails herself since the patient cannot reach her nails.These nails are painful walking and wearing shoes.  This patient presents for at risk foot care today.  General Appearance  Alert, conversant and in no acute stress.  Vascular  Dorsalis pedis and posterior tibial  pulses are weakly  palpable  bilaterally.  Capillary return is within normal limits  bilaterally. Temperature is within normal limits  Bilaterally. Absent hair.  Neurologic  Senn-Weinstein monofilament wire test within normal limits  bilaterally. Muscle power within normal limits bilaterally.  Nails Thick disfigured discolored nails with subungual debris  from hallux to fifth toes bilaterally. No evidence of bacterial infection or drainage bilaterally.  Orthopedic  No limitations of motion  feet .  No crepitus or effusions noted.  No bony pathology or digital deformities noted.  Skin  normotropic skin with no porokeratosis noted bilaterally.  No signs of infections or ulcers noted.     Onychomycosis  Pain in right toes  Pain in left toes  Consent was obtained for treatment procedures.   Mechanical debridement of nails 1-5  bilaterally performed with a nail nipper. No dremel usage.   Return office visit   3 months                   Told patient to return for periodic foot care and evaluation due to potential at risk complications.   Helane Gunther DPM

## 2021-05-28 ENCOUNTER — Ambulatory Visit (INDEPENDENT_AMBULATORY_CARE_PROVIDER_SITE_OTHER): Payer: Medicare Other

## 2021-05-28 DIAGNOSIS — I495 Sick sinus syndrome: Secondary | ICD-10-CM | POA: Diagnosis not present

## 2021-05-28 DIAGNOSIS — Z0000 Encounter for general adult medical examination without abnormal findings: Secondary | ICD-10-CM | POA: Diagnosis not present

## 2021-05-28 LAB — CUP PACEART REMOTE DEVICE CHECK
Battery Remaining Longevity: 40 mo
Battery Remaining Percentage: 36 %
Battery Voltage: 2.96 V
Brady Statistic AP VP Percent: 99 %
Brady Statistic AP VS Percent: 1 %
Brady Statistic AS VP Percent: 1 %
Brady Statistic AS VS Percent: 1 %
Brady Statistic RA Percent Paced: 99 %
Brady Statistic RV Percent Paced: 99 %
Date Time Interrogation Session: 20220929031930
Implantable Lead Implant Date: 20160226
Implantable Lead Implant Date: 20160226
Implantable Lead Location: 753859
Implantable Lead Location: 753860
Implantable Lead Model: 1948
Implantable Pulse Generator Implant Date: 20160226
Lead Channel Impedance Value: 410 Ohm
Lead Channel Impedance Value: 550 Ohm
Lead Channel Pacing Threshold Amplitude: 0.625 V
Lead Channel Pacing Threshold Amplitude: 0.75 V
Lead Channel Pacing Threshold Pulse Width: 0.4 ms
Lead Channel Pacing Threshold Pulse Width: 0.4 ms
Lead Channel Sensing Intrinsic Amplitude: 3.2 mV
Lead Channel Sensing Intrinsic Amplitude: 9 mV
Lead Channel Setting Pacing Amplitude: 1.625
Lead Channel Setting Pacing Amplitude: 2.5 V
Lead Channel Setting Pacing Pulse Width: 0.4 ms
Lead Channel Setting Sensing Sensitivity: 2 mV
Pulse Gen Model: 2240
Pulse Gen Serial Number: 7700661

## 2021-05-28 NOTE — Patient Instructions (Signed)
Health Maintenance, Female Adopting a healthy lifestyle and getting preventive care are important in promoting health and wellness. Ask your health care provider about: The right schedule for you to have regular tests and exams. Things you can do on your own to prevent diseases and keep yourself healthy. What should I know about diet, weight, and exercise? Eat a healthy diet  Eat a diet that includes plenty of vegetables, fruits, low-fat dairy products, and lean protein. Do not eat a lot of foods that are high in solid fats, added sugars, or sodium. Maintain a healthy weight Body mass index (BMI) is used to identify weight problems. It estimates body fat based on height and weight. Your health care provider can help determine your BMI and help you achieve or maintain a healthy weight. Get regular exercise Get regular exercise. This is one of the most important things you can do for your health. Most adults should: Exercise for at least 150 minutes each week. The exercise should increase your heart rate and make you sweat (moderate-intensity exercise). Do strengthening exercises at least twice a week. This is in addition to the moderate-intensity exercise. Spend less time sitting. Even light physical activity can be beneficial. Watch cholesterol and blood lipids Have your blood tested for lipids and cholesterol at 84 years of age, then have this test every 5 years. Have your cholesterol levels checked more often if: Your lipid or cholesterol levels are high. You are older than 84 years of age. You are at high risk for heart disease. What should I know about cancer screening? Depending on your health history and family history, you may need to have cancer screening at various ages. This may include screening for: Breast cancer. Cervical cancer. Colorectal cancer. Skin cancer. Lung cancer. What should I know about heart disease, diabetes, and high blood pressure? Blood pressure and heart  disease High blood pressure causes heart disease and increases the risk of stroke. This is more likely to develop in people who have high blood pressure readings, are of African descent, or are overweight. Have your blood pressure checked: Every 3-5 years if you are 18-39 years of age. Every year if you are 40 years old or older. Diabetes Have regular diabetes screenings. This checks your fasting blood sugar level. Have the screening done: Once every three years after age 40 if you are at a normal weight and have a low risk for diabetes. More often and at a younger age if you are overweight or have a high risk for diabetes. What should I know about preventing infection? Hepatitis B If you have a higher risk for hepatitis B, you should be screened for this virus. Talk with your health care provider to find out if you are at risk for hepatitis B infection. Hepatitis C Testing is recommended for: Everyone born from 1945 through 1965. Anyone with known risk factors for hepatitis C. Sexually transmitted infections (STIs) Get screened for STIs, including gonorrhea and chlamydia, if: You are sexually active and are younger than 84 years of age. You are older than 84 years of age and your health care provider tells you that you are at risk for this type of infection. Your sexual activity has changed since you were last screened, and you are at increased risk for chlamydia or gonorrhea. Ask your health care provider if you are at risk. Ask your health care provider about whether you are at high risk for HIV. Your health care provider may recommend a prescription medicine   to help prevent HIV infection. If you choose to take medicine to prevent HIV, you should first get tested for HIV. You should then be tested every 3 months for as long as you are taking the medicine. Pregnancy If you are about to stop having your period (premenopausal) and you may become pregnant, seek counseling before you get  pregnant. Take 400 to 800 micrograms (mcg) of folic acid every day if you become pregnant. Ask for birth control (contraception) if you want to prevent pregnancy. Osteoporosis and menopause Osteoporosis is a disease in which the bones lose minerals and strength with aging. This can result in bone fractures. If you are 65 years old or older, or if you are at risk for osteoporosis and fractures, ask your health care provider if you should: Be screened for bone loss. Take a calcium or vitamin D supplement to lower your risk of fractures. Be given hormone replacement therapy (HRT) to treat symptoms of menopause. Follow these instructions at home: Lifestyle Do not use any products that contain nicotine or tobacco, such as cigarettes, e-cigarettes, and chewing tobacco. If you need help quitting, ask your health care provider. Do not use street drugs. Do not share needles. Ask your health care provider for help if you need support or information about quitting drugs. Alcohol use Do not drink alcohol if: Your health care provider tells you not to drink. You are pregnant, may be pregnant, or are planning to become pregnant. If you drink alcohol: Limit how much you use to 0-1 drink a day. Limit intake if you are breastfeeding. Be aware of how much alcohol is in your drink. In the [place] one drink equals one 12 oz bottle of beer (355 mL), one 5 oz glass of wine (148 mL), or one 1 oz glass of hard liquor (44 mL). General instructions Schedule regular health, dental, and eye exams. Stay current with your vaccines. Tell your health care provider if: You often feel depressed. You have ever been abused or do not feel safe at home. Summary Adopting a healthy lifestyle and getting preventive care are important in promoting health and wellness. Follow your health care provider's instructions about healthy diet, exercising, and getting tested or screened for diseases. Follow your health care provider's  instructions on monitoring your cholesterol and blood pressure. This information is not intended to replace advice given to you by your health care provider. Make sure you discuss any questions you have with your health care provider. Document Revised: 10/24/2020 Document Reviewed: 08/09/2018 Elsevier Patient Education  2022 Elsevier Inc.  

## 2021-05-28 NOTE — Progress Notes (Cosign Needed)
Subjective:   Tracey Todd is a 86 y.o. female who presents for Medicare Annual (Subsequent) preventive examination. I connected with  [name] on 05/28/21 by a audio enabled telemedicine application and verified that I am speaking with the correct person using two identifiers.   I discussed the limitations of evaluation and management by telemedicine. The patient expressed understanding and agreed to proceed.   Location of patient: Home Location of provider: Office Persons participating in visit: Marquite Attwood. Tracey Todd (patient) Tamala Julian, CMA  Review of Systems    Defer to PCP: Nicolette Bang, MD       Objective:    There were no vitals filed for this visit. There is no height or weight on file to calculate BMI.  Advanced Directives 07/20/2017 06/08/2017 06/02/2016 09/22/2015 04/24/2015 10/21/2014  Does Patient Have a Medical Advance Directive? No No Yes No No No  Type of Advance Directive - Public librarian;Living will - - -  Does patient want to make changes to medical advance directive? - - No - Patient declined - - -  Copy of [place] in Chart? - - No - copy requested - - -  Would patient like information on creating a medical advance directive? No - Patient declined No - Patient declined - - No - patient declined information No - patient declined information    Current Medications (verified) Outpatient Encounter Medications as of 05/28/2021  Medication Sig   amLODipine (NORVASC) 5 MG tablet Take 1 tablet (5 mg total) by mouth daily.   atorvastatin (LIPITOR) 80 MG tablet Take 0.5 tablets (40 mg total) by mouth daily.   bisoprolol (ZEBETA) 10 MG tablet Take 1 tablet (10 mg total) by mouth daily for 90 doses. for blood pressure   Blood Glucose Monitoring Suppl (ONETOUCH VERIO) w/Device KIT Use to check fasting blood sugar daily. Dx: E11.59   calcium carbonate (OS-CAL) 600 MG TABS tablet Take 600 mg by mouth daily with breakfast.    cetirizine (ZYRTEC) 10 MG tablet Take 10 mg by mouth every other day.    Cholecalciferol (VITAMIN D) 2000 UNITS CAPS Take 6,000 Units by mouth daily.    furosemide (LASIX) 40 MG tablet Take 1 tablet (40 mg total) by mouth daily.   glucose blood (ONETOUCH VERIO) test strip Use to check fasting blood sugar daily. Dx: E11.59   Magnesium 500 MG TABS Take 500 mg by mouth 4 (four) times daily.    metFORMIN (GLUCOPHAGE XR) 500 MG 24 hr tablet Take 1 tablet (500 mg total) by mouth in the morning and at bedtime. Needs office visit for additional refills.   OneTouch Delica Lancets 19E MISC Use to check fasting blood sugar daily. Dx: E11.59   potassium chloride SA (KLOR-CON) 20 MEQ tablet Take 1 tablet (20 mEq total) by mouth daily.   warfarin (COUMADIN) 5 MG tablet TAKE AS DIRECTED BY  COUMADIN  CLINIC   No facility-administered encounter medications on file as of 05/28/2021.    Allergies (verified) Penicillins, Quinapril, Feldene [piroxicam], Calcium-containing compounds, Celebrex [celecoxib], and Daypro [oxaprozin]   History: Past Medical History:  Diagnosis Date   Atrial flutter ([place])    a. mentioned in 2016 admission.   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 minimal  by cath 2016.   Nodule of chest wall    Normal coronary arteries and LVF 10/23/14 10/24/2014   Pacemaker implanted 10/25/14- [employer] Jude 10/26/2014   Paroxysmal atrial fibrillation (HCC)    a. identified on device interrogation, burden low   Pulmonary hypertension (HCC)    Sick sinus syndrome ([place])    a. s/p STJ dual chamber PPM    Type II or unspecified type diabetes mellitus without mention of complication, not stated as uncontrolled    Vitamin D deficiency    Past Surgical History:  Procedure Laterality Date   BREAST SURGERY     CATARACT EXTRACTION      LEFT AND RIGHT HEART CATHETERIZATION WITH CORONARY ANGIOGRAM N/A 10/23/2014   Procedure: LEFT AND RIGHT HEART CATHETERIZATION WITH CORONARY ANGIOGRAM;  Surgeon: Blane Ohara, MD;  Location: [facility] CATH LAB;  Service: Cardiovascular;  Laterality: N/A;   PERMANENT PACEMAKER INSERTION N/A 10/25/2014   STJ dual chamber PPM implanted by Dr Rayann Heman for SSS   TONSILLECTOMY AND ADENOIDECTOMY     TOTAL KNEE ARTHROPLASTY     Family History  Problem Relation Age of Onset   Diabetes Mother    Cancer Mother    Cancer Father    Social History   Socioeconomic History   Marital status: Married    Spouse name: Not on file   Number of children: Not on file   Years of education: Not on file   Highest education level: Not on file  Occupational History   Not on file  Tobacco Use   Smoking status: Former    Types: Cigarettes    Quit date: 12/06/1963    Years since quitting: 57.5   Smokeless tobacco: Never  Vaping Use   Vaping Use: Never used  Substance and Sexual Activity   Alcohol use: No   Drug use: Never   Sexual activity: Not on file  Other Topics Concern   Not on file  Social History Narrative   Not on file   Social Determinants of Health   Financial Resource Strain: Not on file  Food Insecurity: Not on file  Transportation Needs: Not on file  Physical Activity: Not on file  Stress: Not on file  Social Connections: Not on file    Tobacco Counseling Counseling given: Not Answered   Clinical Intake:                 Diabetic?Yes         Activities of Daily Living In your present state of health, do you have any difficulty performing the following activities: 11/26/2020  Hearing? N  Vision? N  Difficulty concentrating or making decisions? N  Walking or climbing stairs? Y  Dressing or bathing? N  Doing errands, shopping? N  Some recent data might be hidden    Patient Care Team: Nicolette Bang, MD as PCP - General (Family Medicine) [name], MD  as PCP - Cardiology (Cardiology) [name], MD as PCP - Electrophysiology (Cardiology) Monna Fam, MD as Consulting Physician (Ophthalmology) Stanford Breed Denice Bors, MD as Consulting Physician (Cardiology) Josue Hector, MD as Consulting Physician (Cardiology) Laurence Spates, MD (Inactive) as Consulting Physician (Gastroenterology) Patsey Berthold, NP as Nurse Practitioner (Cardiology)  Indicate any recent Medical Services you may have received from other than [facility] providers in the past year (date may be approximate).     Assessment:   This is a routine wellness examination for [facility].  Hearing/Vision screen No results found.  Dietary issues and exercise  activities discussed:     Goals Addressed   None    Depression Screen PHQ 2/9 Scores 03/06/2021 12/05/2020 11/26/2020 11/14/2019 10/02/2018 06/01/2018 10/23/2017  PHQ - 2 Score 0 0 0 0 0 0 0  PHQ- 9 Score 0 0 0 - 2 - -    Fall Risk Fall Risk  11/26/2020 11/14/2019 10/02/2018 06/01/2018 10/23/2017  Falls in the past year? 0 0 0 No No  Number falls in past yr: 0 0 - - -  Injury with Fall? 0 0 - - -  Risk for fall due to : No Fall Risks - - - -  Follow up Falls evaluation completed - - - -    FALL RISK PREVENTION PERTAINING TO THE HOME:  Any stairs in or around the home? No  If so, are there any without handrails? No  Home free of loose throw rugs in walkways, pet beds, electrical cords, etc? Yes  Adequate lighting in your home to reduce risk of falls? Yes   ASSISTIVE DEVICES UTILIZED TO PREVENT FALLS:  Life alert? No  Use of a cane, walker or w/c? Yes  Grab bars in the bathroom? Yes  Shower chair or bench in shower? No  Elevated toilet seat or a handicapped toilet? Yes   TIMED UP AND GO:  Was the test performed? No .  Length of time to ambulate 10 feet: N/A sec.   Gait steady and fast without use of assistive device  Cognitive Function:        Immunizations Immunization History  Administered Date(s) Administered    Influenza Split 05/23/2013   Influenza, High Dose Seasonal PF 05/30/2014, 05/19/2015, 06/02/2016, 05/26/2017   Influenza,inj,Quad PF,6+ Mos 05/16/2019, 05/19/2020   Influenza-Unspecified 06/02/2018   PFIZER(Purple Top)SARS-COV-2 Vaccination 10/26/2019, 11/20/2019   Pneumococcal Conjugate-13 08/06/2015, 04/09/2019   Pneumococcal Polysaccharide-23 07/29/2013   Td 08/31/2011    TDAP status: Up to date 08/31/11  Flu Vaccine status: Up to date 05/27/21  Pneumococcal vaccine status: Due, Education has been provided regarding the importance of this vaccine. Advised may receive this vaccine at local pharmacy or Health Dept. Aware to provide a copy of the vaccination record if obtained from local pharmacy or Health Dept. Verbalized acceptance and understanding.  Covid-19 vaccine status: Completed vaccines  Qualifies for Shingles Vaccine? No   Zostavax completed No   Shingrix Completed?: No.    Education has been provided regarding the importance of this vaccine. Patient has been advised to call insurance company to determine out of pocket expense if they have not yet received this vaccine. Advised may also receive vaccine at local pharmacy or Health Dept. Verbalized acceptance and understanding.  Screening Tests Health Maintenance  Topic Date Due   Zoster Vaccines- Shingrix (1 of 2) Never done   COVID-19 Vaccine (3 - Pfizer risk series) 12/18/2019   INFLUENZA VACCINE  03/30/2021   URINE MICROALBUMIN  05/19/2021   OPHTHALMOLOGY EXAM  07/04/2021   FOOT EXAM  07/23/2021   TETANUS/TDAP  08/30/2021   HEMOGLOBIN A1C  09/06/2021   DEXA SCAN  Completed   HPV VACCINES  Aged Out    Health Maintenance  Health Maintenance Due  Topic Date Due   Zoster Vaccines- Shingrix (1 of 2) Never done   COVID-19 Vaccine (3 - Pfizer risk series) 12/18/2019   INFLUENZA VACCINE  03/30/2021   URINE MICROALBUMIN  05/19/2021    Colorectal cancer screening: No longer required.   Mammogram status: Completed  08/2019. Repeat every year  Bone Density status: Completed  12/26/17. Results reflect: Bone density results: OSTEOPENIA. Repeat every 3 years.  Lung Cancer Screening: (Low Dose CT Chest recommended if Age 52-80 years, 30 pack-year currently smoking OR have quit w/in 15years.) does qualify.   Lung Cancer Screening Referral:   Additional Screening:  Hepatitis C Screening: does not qualify; Completed   Vision Screening: Recommended annual ophthalmology exams for early detection of glaucoma and other disorders of the eye. Is the patient up to date with their annual eye exam?  Yes  Who is the provider or what is the name of the office in which the patient attends annual eye exams? Yes If pt is not established with a provider, would they like to be referred to a provider to establish care? No .   Dental Screening: Recommended annual dental exams for proper oral hygiene  Community Resource Referral / Chronic Care Management: CRR required this visit?  No   CCM required this visit?  No      Plan:     I have personally reviewed and noted the following in the patient's chart:   Medical and social history Use of alcohol, tobacco or illicit drugs  Current medications and supplements including opioid prescriptions.  Functional ability and status Nutritional status Physical activity Advanced directives List of other physicians Hospitalizations, surgeries, and ER visits in previous 12 months Vitals Screenings to include cognitive, depression, and falls Referrals and appointments  In addition, I have reviewed and discussed with patient certain preventive protocols, quality metrics, and best practice recommendations. A written personalized care plan for preventive services as well as general preventive health recommendations were provided to patient.     [place], [place]   05/28/2021   Nurse Notes: Non face to face 45 minute encounter Mail AVS  Ms. Tracey Todd , Thank you for taking  time to come for your Medicare Wellness Visit. I appreciate your ongoing commitment to your health goals. Please review the following plan we discussed and let me know if I can assist you in the future.   These are the goals we discussed:  Goals   None     This is a list of the screening recommended for you and due dates:  Health Maintenance  Topic Date Due   Zoster (Shingles) Vaccine (1 of 2) Never done   COVID-19 Vaccine (3 - Pfizer risk series) 12/18/2019   Flu Shot  03/30/2021   Urine Protein Check  05/19/2021   Eye exam for diabetics  07/04/2021   Complete foot exam   07/23/2021   Tetanus Vaccine  08/30/2021   Hemoglobin A1C  09/06/2021   DEXA scan (bone density measurement)  Completed   HPV Vaccine  Aged Out

## 2021-06-02 ENCOUNTER — Other Ambulatory Visit: Payer: Self-pay

## 2021-06-02 ENCOUNTER — Encounter (INDEPENDENT_AMBULATORY_CARE_PROVIDER_SITE_OTHER): Payer: Self-pay

## 2021-06-02 ENCOUNTER — Ambulatory Visit: Payer: Medicare Other | Admitting: *Deleted

## 2021-06-02 DIAGNOSIS — I480 Paroxysmal atrial fibrillation: Secondary | ICD-10-CM | POA: Diagnosis not present

## 2021-06-02 DIAGNOSIS — Z7901 Long term (current) use of anticoagulants: Secondary | ICD-10-CM

## 2021-06-02 LAB — POCT INR: INR: 2.9 (ref 2.0–3.0)

## 2021-06-02 NOTE — Patient Instructions (Signed)
Description   Continue taking Warfarin 1 tablet daily except 1.5 tablets on Tuesdays and Saturdays. Eating 2 serving of greens per week plus your one day of brussels sprouts. Recheck INR 6 weeks. [facility]in [facility] [phone]

## 2021-06-03 NOTE — Progress Notes (Signed)
Remote pacemaker transmission.   

## 2021-06-08 ENCOUNTER — Ambulatory Visit (INDEPENDENT_AMBULATORY_CARE_PROVIDER_SITE_OTHER): Payer: Medicare Other | Admitting: Family

## 2021-06-08 ENCOUNTER — Other Ambulatory Visit: Payer: Self-pay

## 2021-06-08 ENCOUNTER — Encounter: Payer: Self-pay | Admitting: Family

## 2021-06-08 VITALS — BP 155/81 | HR 60 | Temp 98.4°F | Resp 16 | Ht <= 58 in | Wt 199.0 lb

## 2021-06-08 DIAGNOSIS — R21 Rash and other nonspecific skin eruption: Secondary | ICD-10-CM | POA: Diagnosis not present

## 2021-06-08 DIAGNOSIS — E876 Hypokalemia: Secondary | ICD-10-CM | POA: Diagnosis not present

## 2021-06-08 DIAGNOSIS — M7989 Other specified soft tissue disorders: Secondary | ICD-10-CM

## 2021-06-08 DIAGNOSIS — I10 Essential (primary) hypertension: Secondary | ICD-10-CM | POA: Diagnosis not present

## 2021-06-08 DIAGNOSIS — E1159 Type 2 diabetes mellitus with other circulatory complications: Secondary | ICD-10-CM | POA: Diagnosis not present

## 2021-06-08 DIAGNOSIS — Z0001 Encounter for general adult medical examination with abnormal findings: Secondary | ICD-10-CM

## 2021-06-08 DIAGNOSIS — Z0000 Encounter for general adult medical examination without abnormal findings: Secondary | ICD-10-CM

## 2021-06-08 DIAGNOSIS — L299 Pruritus, unspecified: Secondary | ICD-10-CM

## 2021-06-08 LAB — POCT GLYCOSYLATED HEMOGLOBIN (HGB A1C): HbA1c, POC (controlled diabetic range): 8 % — AB (ref 0.0–7.0)

## 2021-06-08 MED ORDER — ONETOUCH DELICA LANCETS 33G MISC: 2 refills | Status: DC

## 2021-06-08 MED ORDER — FUROSEMIDE 40 MG PO TABS: 40.0000 mg | ORAL_TABLET | Freq: Every day | ORAL | 0 refills | Status: DC

## 2021-06-08 MED ORDER — METFORMIN HCL ER 500 MG PO TB24: 500.0000 mg | ORAL_TABLET | Freq: Two times a day (BID) | ORAL | 0 refills | Status: DC

## 2021-06-08 MED ORDER — BISOPROLOL FUMARATE 10 MG PO TABS: 10.0000 mg | ORAL_TABLET | Freq: Every day | ORAL | 0 refills | Status: DC

## 2021-06-08 MED ORDER — ATORVASTATIN CALCIUM 80 MG PO TABS: 40.0000 mg | ORAL_TABLET | Freq: Every day | ORAL | 2 refills | Status: DC

## 2021-06-08 MED ORDER — ONETOUCH VERIO VI STRP: ORAL_STRIP | 2 refills | Status: DC

## 2021-06-08 MED ORDER — AMLODIPINE BESYLATE 5 MG PO TABS: 5.0000 mg | ORAL_TABLET | Freq: Every day | ORAL | 0 refills | Status: DC

## 2021-06-08 NOTE — Progress Notes (Signed)
Medicare Annual Wellness Visit, Subsequent   Tracey Todd is a 86 y.o. female who presents for a Medicare Annual Wellness Visit, Subsequent. She is accompanied by her daughter, Tracey Todd.   Current issues and/or concerns: Reports she is fasting and did not take blood pressure medications prior to today's visit. Reports home blood pressures are 120's-130's/60's-70's.   Reports taking Metformin and doing well. Blood sugars 150's-160's.   Reports wheezing with non-productive cough sometimes in the morning. Denies red flag symptoms.   Reports right ankle with numbness and sometimes swelling of the right foot. Reports she is unable to wear compressions stockings because she cannot reach down to put them on. Denies red flag symptoms such as but not limited to calf tenderness and redness, shortness of breath (reports sometimes but related to weight), and chest pain. Does not prefer to begin medication.  Right posterior forearm with several spots for at least 1 year. Reports has been about the same since appeared. Prefers to watchful wait for now.  Right hand itching, palmar aspect. Denies rash. Reports she does use hands often for example while working in kitchen with dishes. Moisturizing with Eucerin.   Would like to know if she can discontinue potassium chloride.   Patient and daughter express concern thinking that today was an annual physical exam. Patient reports she had annual medicare wellness visit in July 2022. Subsequently explained that she declined offer for annual medicare wellness visit in August 2022. Reports she thought an annual medicare wellness visit and an annual physical exam were the same.   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 no     Had little interest or pleasure in doing things? no     Additional depression screening completed? yes. Results? No anxiety or depression        Functional Ability Does the patient need help with: Myrtis Ser index)         Bathing: no         Dressing : no         Toileting: no         Transferring: no         Continence: no          Feeding: no           Safety Screen Does the home have:  Rugs in the hallway: no         Grab bars in the bathroom: yes         Handrails on the stairs:1 step outside without handrail          Stairs in home: no         Poor lightning: no   Hearing Evaluation Do you have trouble hearing the television when others do not? no Do you have to strain to hear/understand conversations? No  Seeing Evaluation Vision screen performed. Established with [facility] and scheduled for appointment November 2022.    Falls Risk: Does the patient need assistance with ambulation? Uses a walking cane  Does the patient have a history of a fall in the last 90 days? no  Is the patient at  risk for falls? Yes, using assistive device  Was the patient's timed "Get Up and Go Test" unsteady or longer than 30 seconds? no.   Advanced Care Planning Patient has executed an Advance Directive: no  If no, patient was given the opportunity to execute an Advance Directive today? yes This patient has the ability to prepare an Advance Directive: yes   Cognitive Assessment Does the patient have evidence of cognitive impairment? No The patient does not have evidence of a change in mood/affect, appearance, speech, memory or motor skills.   MMSE - Mini Mental State Exam 06/08/2021  Orientation to time 4  Orientation to Place 5  Registration 3  Attention/ Calculation 4  Recall 3  Language- name 2 objects 2  Language- repeat 1  Language- follow 3 step command 3  Language- read & follow direction 1  Write a sentence 1  Copy design 1  Total score 28    PHYSICAL EXAM: Vitals:   06/08/21 0907  BP: (!) 155/81  Pulse: 60  Resp: 16  Temp: 98.4 F (36.9 C)  TempSrc: Oral  SpO2: 93%  Weight: 199 lb (90.3 kg)  Height: 4\' 10"  (1.473 m)   Body mass index is 41.59 kg/m.  Physical Exam HENT:     Head: Normocephalic and atraumatic.     Right Ear: Tympanic membrane, ear canal and external ear normal.     Left Ear: Tympanic membrane, ear canal and external ear normal.  Eyes:     Extraocular Movements: Extraocular movements intact.     Conjunctiva/sclera: Conjunctivae normal.     Pupils: Pupils are equal, round, and reactive to light.  Cardiovascular:     Rate and Rhythm: Normal rate and regular rhythm.     Pulses: Normal pulses.     Heart sounds: Normal heart sounds.  Pulmonary:     Effort: Pulmonary effort is normal.     Breath sounds: Normal breath sounds.  Musculoskeletal:     Cervical back: Normal range of motion and neck supple.  Skin:    General: Skin is warm and dry.     Comments: Several hyperpigmented macular spot at right posterior forearm. No evidence of erythema,  drainage, or pain with palpation.  Neurological:     General: No focal deficit present.     Mental Status: She is alert and oriented to person, place, and time.  Psychiatric:        Mood and Affect: Mood normal.        Behavior: Behavior normal.     Results for orders placed or performed in visit on 06/08/21  POCT glycosylated hemoglobin (Hb A1C)  Result Value Ref Range   Hemoglobin A1C  HbA1c POC (<> result, manual entry)     HbA1c, POC (prediabetic range)     HbA1c, POC (controlled diabetic range) 8.0 (A) 0.0 - 7.0 %    Assessment and Plan: 1. Medicare annual wellness visit, subsequent: - Counseled on 150 minutes of exercise per week as tolerated, healthy eating (including decreased daily intake of saturated fats, cholesterol, added sugars, sodium), STI prevention, and routine healthcare maintenance.  2. Essential hypertension: - Blood pressure not at goal during today's visit. Patient asymptomatic without chest pressure, chest pain, palpitations, shortness of breath, worst headache of life, and any additional red flag symptoms. Patient reports she did not take blood pressure medications this morning as she is fasting.  - Home blood pressures at goal.  - Continue Amlodipine, Bisoprolol, and Furosemide as prescribed. - Counseled on blood pressure goal of less than 140/90, low-sodium, DASH diet, medication compliance, 150 minutes of moderate intensity exercise per week as tolerated. Discussed medication compliance, adverse effects. - Follow-up with primary provider in 3 months or sooner if needed.  - amLODipine (NORVASC) 5 MG tablet; Take 1 tablet (5 mg total) by mouth daily.  Dispense: 90 tablet; Refill: 0 - bisoprolol (ZEBETA) 10 MG tablet; Take 1 tablet (10 mg total) by mouth daily for 90 doses. for blood pressure  Dispense: 90 tablet; Refill: 0 - furosemide (LASIX) 40 MG tablet; Take 1 tablet (40 mg total) by mouth daily.  Dispense: 90 tablet; Refill: 0  3. Type 2 diabetes  mellitus with other circulatory complication, unspecified whether long term insulin use (HCC): - Hemoglobin A1c today at goal at 8.0%, goal < 8%. This is increased from previous hemoglobin A1c of 7.7% on 03/06/2021. Next hemoglobin A1c due January 2023.  - Continue Metformin as prescribed.  - Discussed the importance of healthy eating habits, low-carbohydrate diet, low-sugar diet, regular aerobic exercise (at least 150 minutes a week as tolerated) and medication compliance to achieve or maintain control of diabetes. - Follow-up with primary provider in 3 months or sooner if needed.  - POCT glycosylated hemoglobin (Hb A1C) - metFORMIN (GLUCOPHAGE XR) 500 MG 24 hr tablet; Take 1 tablet (500 mg total) by mouth in the morning and at bedtime. Needs office visit for additional refills.  Dispense: 180 tablet; Refill: 0 - OneTouch Delica Lancets 33G MISC; Use to check fasting blood sugar daily. Dx: E11.59  Dispense: 100 each; Refill: 2 - glucose blood (ONETOUCH VERIO) test strip; Use to check fasting blood sugar daily. Dx: E11.59  Dispense: 100 each; Refill: 2  4. Leg swelling: - Continue Furosemide as prescribed.  - Keep all scheduled appointments with Podiatry for ankle and foot concerns.  - Follow-up with primary provider as scheduled.  - furosemide (LASIX) 40 MG tablet; Take 1 tablet (40 mg total) by mouth daily.  Dispense: 90 tablet; Refill: 0  5. Rash and nonspecific skin eruption: - Located right posterior forearm.  - Patient declines referral to Dermatology as of present and prefers to watchful wait.  - Follow-up with primary provider as scheduled.   6. Itching: - Located at right hand. - Continue moisturizing with over-the-counter Eucerin.  - May consider over-the-counter cortisone.  - Patient declines referral to Dermatology as of present and prefers to watchful wait.  - Follow-up with primary provider as scheduled.   7. Hypokalemia: - Screening for level of potassium.  -  Potassium  The following orders were placed at today's visit; Orders Placed This Encounter  Procedures   Potassium   POCT glycosylated hemoglobin (Hb  A1C)     An after visit summary with all of these plans was given to the patient.

## 2021-06-09 LAB — POTASSIUM: Potassium: 4.6 mmol/L (ref 3.5–5.2)

## 2021-06-09 NOTE — Progress Notes (Signed)
Potassium normal. Discontinue potassium supplement for now. Please schedule lab only appointment to have potassium rechecked in 4 to 6 weeks.   Diabetes discussed in office.

## 2021-06-22 ENCOUNTER — Other Ambulatory Visit: Payer: Self-pay | Admitting: Internal Medicine

## 2021-06-22 DIAGNOSIS — I480 Paroxysmal atrial fibrillation: Secondary | ICD-10-CM

## 2021-06-22 DIAGNOSIS — Z7901 Long term (current) use of anticoagulants: Secondary | ICD-10-CM

## 2021-06-22 NOTE — Telephone Encounter (Signed)
Prescription refill request received for warfarin Lov: 04/27/21 Lanna Poche)  Next INR check: 07/14/21 Warfarin tablet strength: 5mg   Appropriate dose and refill sent to requested pharmacy.

## 2021-06-29 ENCOUNTER — Other Ambulatory Visit: Payer: Self-pay | Admitting: Family

## 2021-06-29 DIAGNOSIS — Z1231 Encounter for screening mammogram for malignant neoplasm of breast: Secondary | ICD-10-CM

## 2021-07-14 ENCOUNTER — Ambulatory Visit: Payer: Medicare Other

## 2021-07-14 ENCOUNTER — Other Ambulatory Visit: Payer: Self-pay

## 2021-07-14 DIAGNOSIS — I480 Paroxysmal atrial fibrillation: Secondary | ICD-10-CM | POA: Diagnosis not present

## 2021-07-14 DIAGNOSIS — H40023 Open angle with borderline findings, high risk, bilateral: Secondary | ICD-10-CM | POA: Diagnosis not present

## 2021-07-14 DIAGNOSIS — Z7901 Long term (current) use of anticoagulants: Secondary | ICD-10-CM

## 2021-07-14 DIAGNOSIS — H04123 Dry eye syndrome of bilateral lacrimal glands: Secondary | ICD-10-CM | POA: Diagnosis not present

## 2021-07-14 DIAGNOSIS — H01134 Eczematous dermatitis of left upper eyelid: Secondary | ICD-10-CM | POA: Diagnosis not present

## 2021-07-14 DIAGNOSIS — E113291 Type 2 diabetes mellitus with mild nonproliferative diabetic retinopathy without macular edema, right eye: Secondary | ICD-10-CM | POA: Diagnosis not present

## 2021-07-14 LAB — POCT INR: INR: 2.5 (ref 2.0–3.0)

## 2021-07-14 LAB — HM DIABETES EYE EXAM

## 2021-07-14 NOTE — Patient Instructions (Signed)
Description   Continue taking Warfarin 1 tablet daily except 1.5 tablets on Tuesdays and Saturdays. Eating 2 serving of greens per week plus your one day of brussels sprouts. Recheck INR 6 weeks. [facility]in [facility] [phone]

## 2021-07-20 ENCOUNTER — Other Ambulatory Visit: Payer: Self-pay

## 2021-07-20 ENCOUNTER — Other Ambulatory Visit: Payer: Medicare Other

## 2021-07-20 DIAGNOSIS — E876 Hypokalemia: Secondary | ICD-10-CM

## 2021-07-20 NOTE — Progress Notes (Signed)
Labs completed

## 2021-07-21 ENCOUNTER — Other Ambulatory Visit: Payer: Self-pay | Admitting: Family

## 2021-07-21 DIAGNOSIS — E876 Hypokalemia: Secondary | ICD-10-CM

## 2021-07-21 LAB — POTASSIUM: Potassium: 4.8 mmol/L (ref 3.5–5.2)

## 2021-07-21 MED ORDER — POTASSIUM CHLORIDE CRYS ER 20 MEQ PO TBCR: 20.0000 meq | EXTENDED_RELEASE_TABLET | Freq: Every day | ORAL | 0 refills | Status: DC

## 2021-07-21 NOTE — Progress Notes (Signed)
Please call patient with update.   Potassium normal. Continue Potassium Chloride as prescribed for potassium maintenance. Encouraged to recheck potassium levels in 3 months or sooner if needed.

## 2021-07-29 NOTE — Progress Notes (Signed)
Patient ID: Tracey Todd, female    DOB: 03/09/1935  MRN: [id number]  CC: Allergies  Subjective: Tracey Todd is a 86 y.o. female who presents for allergies.   Her concerns today include:  ALLERGIES: Reports she quit taking Zyrtec 3 weeks ago because this was causing her blood pressures to be 140's-150's/90's. Since then blood pressures have returned to normal. No additional issues or concerns.   2. LEG NUMBNESS: Reports bilateral leg numbness ongoing. Denies any red flag symptoms such as but not limited to chest pain, shortness of breath, tenderness, warmth, and redness. Denies falls. Not ready for referral just yet.   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 90 tablet 0   atorvastatin (LIPITOR) 80 MG tablet Take 0.5 tablets (40 mg total) by mouth daily. 15 tablet 2   bisoprolol (ZEBETA) 10 MG tablet Take 1 tablet (10 mg total) by mouth daily for 90 doses. for blood pressure 90 tablet 0   Blood Glucose Monitoring Suppl  (ONETOUCH VERIO) w/Device KIT Use to check fasting blood sugar daily. Dx: E11.59 1 kit 0   calcium carbonate (OS-CAL) 600 MG TABS tablet Take 600 mg by mouth daily with breakfast.     cetirizine (ZYRTEC) 10 MG tablet Take 10 mg by mouth every other day.      Cholecalciferol (VITAMIN D) 2000 UNITS CAPS Take 6,000 Units by mouth daily.      furosemide (LASIX) 40 MG tablet Take 1 tablet (40 mg total) by mouth daily. 90 tablet 0   glucose blood (ONETOUCH VERIO) test strip Use to check fasting blood sugar daily. Dx: E11.59 100 each 2   Magnesium 500 MG TABS Take 500 mg by mouth 4 (four) times daily.      metFORMIN (GLUCOPHAGE XR) 500 MG 24 hr tablet Take 1 tablet (500 mg total) by mouth in the morning and at bedtime. Needs office visit for additional refills. 180 tablet 0   OneTouch Delica Lancets 23T MISC Use to check fasting blood sugar daily. Dx: E11.59 100 each 2   potassium chloride SA (KLOR-CON) 20 MEQ tablet Take 1 tablet (20 mEq total) by mouth daily. 90 tablet 0   No current facility-administered medications on file prior to visit.    Allergies  Allergen Reactions   Penicillins Anaphylaxis   Quinapril     Angioedema    Feldene [Piroxicam] Other (See Comments)    Bleeding    Calcium-Containing Compounds Other (See Comments)    "calcium deposits  on skin"   Celebrex [Celecoxib] Itching   Daypro [Oxaprozin] Itching    Social History   Socioeconomic History   Marital status: Married    Spouse name: Not on file   Number of children: Not on file   Years of education: Not on file   Highest education level: Not on file  Occupational History   Not on file  Tobacco Use   Smoking status: Former    Types: Cigarettes    Quit date: 12/06/1963    Years since quitting: 25.6   Smokeless tobacco: Never  Vaping Use   Vaping Use: Never used  Substance and Sexual Activity   Alcohol use: No   Drug use: Never   Sexual activity: Not Currently  Other Topics Concern   Not on file  Social  History Narrative   Not on file   Social Determinants of Health   Financial Resource Strain: Low Risk    Difficulty of Paying Living Expenses: Not very hard  Food Insecurity: No Food Insecurity   Worried About Running Out of Food in the Last Year: Never true   [place] in the Last Year: Never true  Transportation Needs: No Transportation Needs   Lack of Transportation (Medical): No   Lack of Transportation (Non-Medical): No  Physical Activity: Inactive   Days of Exercise per Week: 0 days   Minutes of Exercise per Session: 0 min  Stress: No Stress Concern Present   Feeling of Stress : Not at all  Social Connections: Moderately Integrated   Frequency of Communication with Friends and Family: More than three times a week   Frequency of Social Gatherings with Friends and Family: More than three times a week   Attends Religious Services: More than 4 times per year   Active Member of [employer] or Organizations: No   Attends Music therapist: Never   Marital Status: Married  Human resources officer Violence: Not At Risk   Fear of Current or Ex-Partner: No   Emotionally Abused: No   Physically Abused: No   Sexually Abused: No    Family History  Problem Relation Age of Onset   Diabetes Mother    Cancer Mother    Cancer Father     Past Surgical History:  Procedure Laterality Date   BREAST SURGERY     CATARACT EXTRACTION     LEFT AND RIGHT HEART CATHETERIZATION WITH CORONARY ANGIOGRAM N/A 10/23/2014   Procedure: LEFT AND RIGHT HEART CATHETERIZATION WITH CORONARY ANGIOGRAM;  Surgeon: Blane Ohara, MD;  Location: [facility] CATH LAB;  Service: Cardiovascular;  Laterality: N/A;   PERMANENT PACEMAKER INSERTION N/A 10/25/2014   STJ dual chamber PPM implanted by Dr Rayann Heman for SSS   TONSILLECTOMY AND ADENOIDECTOMY     TOTAL KNEE ARTHROPLASTY      ROS: Review of Systems Negative except as stated above  PHYSICAL EXAM: BP 135/86   Pulse 62   Temp 98.3 F (36.8 C)   Resp 18    Ht 4' 9.99" (1.473 m)   Wt 200 lb 3.2 oz (90.8 kg)   SpO2 93%   BMI 41.85 kg/m   Physical Exam HENT:     Head: Normocephalic and atraumatic.  Eyes:     Extraocular Movements: Extraocular movements intact.     Conjunctiva/sclera: Conjunctivae normal.     Pupils: Pupils are equal, round, and reactive to light.  Cardiovascular:     Rate and Rhythm: Normal rate and regular rhythm.     Pulses:  Normal pulses.     Heart sounds: Normal heart sounds.  Pulmonary:     Effort: Pulmonary effort is normal.     Breath sounds: Normal breath sounds.  Musculoskeletal:     Cervical back: Normal range of motion and neck supple.  Neurological:     General: No focal deficit present.     Mental Status: She is alert and oriented to person, place, and time.  Psychiatric:        Mood and Affect: Mood normal.        Behavior: Behavior normal.    ASSESSMENT AND PLAN: 1. Perennial allergic rhinitis: - Discontinue Cetirizine.  - Try over-the-counter non-drowsy Loratadine.  - Follow-up with primary provider as scheduled.  2. Essential hypertension: - Blood pressure normal today in office.  - Continue current regimen Amlodipine, Bisoprolol, and Furosemide as prescribed. - Follow-up with primary provider as scheduled.  3. Bilateral leg numbness: - Patient declined referral to Neurology.  - Follow-up with primary provider as scheduled.   Patient was given the opportunity to ask questions.  Patient verbalized understanding of the plan and was able to repeat key elements of the plan. Patient was given clear instructions to go to Emergency Department or return to medical center if symptoms don't improve, worsen, or new problems develop.The patient verbalized understanding.  Follow-up with primary provider as scheduled.  Camillia Herter, NP

## 2021-07-30 ENCOUNTER — Encounter: Payer: Self-pay | Admitting: Family

## 2021-07-30 ENCOUNTER — Other Ambulatory Visit: Payer: Self-pay

## 2021-07-30 ENCOUNTER — Ambulatory Visit (INDEPENDENT_AMBULATORY_CARE_PROVIDER_SITE_OTHER): Payer: Medicare Other | Admitting: Family

## 2021-07-30 VITALS — BP 135/86 | HR 62 | Temp 98.3°F | Resp 18 | Ht <= 58 in | Wt 200.2 lb

## 2021-07-30 DIAGNOSIS — J3089 Other allergic rhinitis: Secondary | ICD-10-CM | POA: Diagnosis not present

## 2021-07-30 DIAGNOSIS — I10 Essential (primary) hypertension: Secondary | ICD-10-CM | POA: Diagnosis not present

## 2021-07-30 DIAGNOSIS — R200 Anesthesia of skin: Secondary | ICD-10-CM | POA: Diagnosis not present

## 2021-07-30 NOTE — Progress Notes (Signed)
Pt presents for allergy issues, states that she has tried Zyrtec but feels like it is causing her BP to be elevated

## 2021-08-05 ENCOUNTER — Ambulatory Visit
Admission: RE | Admit: 2021-08-05 | Discharge: 2021-08-05 | Disposition: A | Discharge status: Home / Self Care | Payer: Medicare Other | Source: Ambulatory Visit | Attending: Family | Admitting: Family

## 2021-08-05 ENCOUNTER — Encounter: Payer: Self-pay | Admitting: Family

## 2021-08-05 DIAGNOSIS — Z1231 Encounter for screening mammogram for malignant neoplasm of breast: Secondary | ICD-10-CM

## 2021-08-05 NOTE — Progress Notes (Signed)
Please call patient with update.   Mammogram with no evidence of malignancy. Repeat in 12 months.

## 2021-08-10 ENCOUNTER — Other Ambulatory Visit: Payer: Self-pay

## 2021-08-10 ENCOUNTER — Encounter: Payer: Self-pay | Admitting: Podiatry

## 2021-08-10 ENCOUNTER — Ambulatory Visit (INDEPENDENT_AMBULATORY_CARE_PROVIDER_SITE_OTHER): Payer: Medicare Other | Admitting: Podiatry

## 2021-08-10 DIAGNOSIS — B351 Tinea unguium: Secondary | ICD-10-CM | POA: Diagnosis not present

## 2021-08-10 DIAGNOSIS — D689 Coagulation defect, unspecified: Secondary | ICD-10-CM

## 2021-08-10 DIAGNOSIS — M79676 Pain in unspecified toe(s): Secondary | ICD-10-CM | POA: Diagnosis not present

## 2021-08-10 DIAGNOSIS — E1121 Type 2 diabetes mellitus with diabetic nephropathy: Secondary | ICD-10-CM

## 2021-08-10 NOTE — Progress Notes (Signed)
This patient returns to my office for at risk foot care.  This patient requires this care by a professional since this patient will be at risk due to having diabetes and coagulation defect.  Patient is taking coumadin.  This patient is unable to cut nails herself since the patient cannot reach her nails.These nails are painful walking and wearing shoes.  This patient presents for at risk foot care today.  General Appearance  Alert, conversant and in no acute stress.  Vascular  Dorsalis pedis and posterior tibial  pulses are weakly  palpable  bilaterally.  Capillary return is within normal limits  bilaterally. Temperature is within normal limits  Bilaterally. Absent hair.  Neurologic  Senn-Weinstein monofilament wire test within normal limits  bilaterally. Muscle power within normal limits bilaterally.  Nails Thick disfigured discolored nails with subungual debris  from hallux to fifth toes bilaterally. No evidence of bacterial infection or drainage bilaterally.  Orthopedic  No limitations of motion  feet .  No crepitus or effusions noted.  No bony pathology or digital deformities noted.  Skin  normotropic skin with no porokeratosis noted bilaterally.  No signs of infections or ulcers noted.     Onychomycosis  Pain in right toes  Pain in left toes  Consent was obtained for treatment procedures.   Mechanical debridement of nails 1-5  bilaterally performed with a nail nipper. No dremel usage.   Return office visit   3 months                   Told patient to return for periodic foot care and evaluation due to potential at risk complications.   Helane Gunther DPM

## 2021-08-14 ENCOUNTER — Other Ambulatory Visit: Payer: Self-pay | Admitting: Internal Medicine

## 2021-08-14 DIAGNOSIS — I480 Paroxysmal atrial fibrillation: Secondary | ICD-10-CM

## 2021-08-14 DIAGNOSIS — Z7901 Long term (current) use of anticoagulants: Secondary | ICD-10-CM

## 2021-08-14 NOTE — Telephone Encounter (Signed)
Prescription refill request received for warfarin Lov: 04/27/21 Lanna Poche)  Next INR check: 08/25/22 Warfarin tablet strength: 5mg   Appropriate dose and refill sent to requested pharmacy.

## 2021-08-25 ENCOUNTER — Ambulatory Visit: Payer: Medicare Other

## 2021-08-25 ENCOUNTER — Other Ambulatory Visit: Payer: Self-pay

## 2021-08-25 DIAGNOSIS — I480 Paroxysmal atrial fibrillation: Secondary | ICD-10-CM | POA: Diagnosis not present

## 2021-08-25 DIAGNOSIS — Z7901 Long term (current) use of anticoagulants: Secondary | ICD-10-CM | POA: Diagnosis not present

## 2021-08-25 LAB — POCT INR: INR: 4.1 — AB (ref 2.0–3.0)

## 2021-08-25 NOTE — Patient Instructions (Signed)
Description   Skip today's dosage of Warfarin, then start taking Warfarin 1 tablet daily except 1.5 tablets on Tuesdays. Continue eating 2 serving of greens per week plus your one day of brussels sprouts. Recheck INR 2 weeks. [facility] [phone]

## 2021-08-27 ENCOUNTER — Ambulatory Visit (INDEPENDENT_AMBULATORY_CARE_PROVIDER_SITE_OTHER): Payer: Medicare Other

## 2021-08-27 DIAGNOSIS — I495 Sick sinus syndrome: Secondary | ICD-10-CM

## 2021-08-27 LAB — CUP PACEART REMOTE DEVICE CHECK
Battery Remaining Longevity: 38 mo
Battery Remaining Percentage: 34 %
Battery Voltage: 2.96 V
Brady Statistic AP VP Percent: 99 %
Brady Statistic AP VS Percent: 1 %
Brady Statistic AS VP Percent: 1 %
Brady Statistic AS VS Percent: 1 %
Brady Statistic RA Percent Paced: 99 %
Brady Statistic RV Percent Paced: 99 %
Date Time Interrogation Session: 20221229020016
Implantable Lead Implant Date: 20160226
Implantable Lead Implant Date: 20160226
Implantable Lead Location: 753859
Implantable Lead Location: 753860
Implantable Lead Model: 1948
Implantable Pulse Generator Implant Date: 20160226
Lead Channel Impedance Value: 410 Ohm
Lead Channel Impedance Value: 540 Ohm
Lead Channel Pacing Threshold Amplitude: 0.5 V
Lead Channel Pacing Threshold Amplitude: 0.75 V
Lead Channel Pacing Threshold Pulse Width: 0.4 ms
Lead Channel Pacing Threshold Pulse Width: 0.4 ms
Lead Channel Sensing Intrinsic Amplitude: 5 mV
Lead Channel Sensing Intrinsic Amplitude: 8.1 mV
Lead Channel Setting Pacing Amplitude: 1.5 V
Lead Channel Setting Pacing Amplitude: 2.5 V
Lead Channel Setting Pacing Pulse Width: 0.4 ms
Lead Channel Setting Sensing Sensitivity: 2 mV
Pulse Gen Model: 2240
Pulse Gen Serial Number: 7700661

## 2021-09-01 ENCOUNTER — Other Ambulatory Visit: Payer: Self-pay | Admitting: Family

## 2021-09-01 DIAGNOSIS — E1159 Type 2 diabetes mellitus with other circulatory complications: Secondary | ICD-10-CM

## 2021-09-01 DIAGNOSIS — I10 Essential (primary) hypertension: Secondary | ICD-10-CM

## 2021-09-01 NOTE — Telephone Encounter (Signed)
Amlodipine and Bisoprolol refilled as requested. Metformin refilled for 30 day courtesy. Updated hemoglobin A1c due. Schedule appointment for additional refills.

## 2021-09-08 ENCOUNTER — Other Ambulatory Visit: Payer: Self-pay

## 2021-09-08 ENCOUNTER — Ambulatory Visit: Payer: Medicare Other

## 2021-09-08 DIAGNOSIS — Z7901 Long term (current) use of anticoagulants: Secondary | ICD-10-CM | POA: Diagnosis not present

## 2021-09-08 DIAGNOSIS — I480 Paroxysmal atrial fibrillation: Secondary | ICD-10-CM | POA: Diagnosis not present

## 2021-09-08 LAB — POCT INR: INR: 2.3 (ref 2.0–3.0)

## 2021-09-08 NOTE — Patient Instructions (Signed)
Description   Continue on same dosage Warfarin 1 tablet daily except 1.5 tablets on Tuesdays. Continue eating 2 serving of greens per week plus your one day of brussels sprouts. Recheck INR 3 weeks. [facility] [phone]

## 2021-09-08 NOTE — Progress Notes (Signed)
Remote pacemaker transmission.   

## 2021-09-08 NOTE — Progress Notes (Signed)
Patient ID: Tracey Todd, female    DOB: 02-20-1935  MRN: [id number]  CC: Diabetes Follow-Up   Subjective: Tracey Todd is a 87 y.o. female who presents for diabetes follow-up.   Her concerns today include:   DIABETES TYPE 2 FOLLOW-UP: 06/08/2021: - Hemoglobin A1c today at goal at 8.0%, goal < 8%. This is increased from previous hemoglobin A1c of 7.7% on 03/06/2021. Next hemoglobin A1c due January 2023.  - Continue Metformin as prescribed.   09/14/2021: Doing well on current regimen. Still having numbness of bilateral lower extremities right > left. Worse during bedtime. Reports feet feel cold as well sometimes.   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 15 tablet 2   bisoprolol (ZEBETA) 10 MG tablet Take 1 tablet by mouth once daily for blood pressure 90 tablet 0   Blood Glucose Monitoring Suppl (ONETOUCH VERIO) w/Device KIT Use to check fasting blood sugar daily. Dx: E11.59 1 kit 0   calcium carbonate (OS-CAL)  600 MG TABS tablet Take 600 mg by mouth daily with breakfast.     cetirizine (ZYRTEC) 10 MG tablet Take 10 mg by mouth every other day.      Cholecalciferol (VITAMIN D) 2000 UNITS CAPS Take 6,000 Units by mouth daily.      furosemide (LASIX) 40 MG tablet Take 1 tablet (40 mg total) by mouth daily. 90 tablet 0   glucose blood (ONETOUCH VERIO) test strip Use to check fasting blood sugar daily. Dx: E11.59 100 each 2   Magnesium 500 MG TABS Take 500 mg by mouth 4 (four) times daily.      OneTouch Delica Lancets 33G MISC Use to check fasting blood sugar daily. Dx: E11.59 100 each 2   potassium chloride SA (KLOR-CON) 20 MEQ tablet Take 1 tablet (20 mEq total) by mouth daily. 90 tablet 0   warfarin (COUMADIN) 5 MG tablet AS DIRECTED BY  COUMADIN  CLINIC 40 tablet 2   No current facility-administered medications on file prior to visit.    Allergies  Allergen Reactions   Penicillins Anaphylaxis   Quinapril     Angioedema    Feldene [Piroxicam] Other (See Comments)    Bleeding    Calcium-Containing Compounds Other (See Comments)    "calcium deposits on skin"   Celebrex [Celecoxib] Itching   Daypro [Oxaprozin] Itching    Social History   Socioeconomic History   Marital status: Married    Spouse name: Not on file   Number of children:  Not on file   Years of education: Not on file   Highest education level: Not on file  Occupational History   Not on file  Tobacco Use   Smoking status: Former    Types: Cigarettes    Quit date: 12/06/1963    Years since quitting: 57.8   Smokeless tobacco: Never  Vaping Use   Vaping Use: Never used  Substance and Sexual Activity   Alcohol use: No   Drug use: Never   Sexual activity: Not Currently  Other Topics Concern   Not on file  Social History Narrative   Not on file   Social Determinants of Health   Financial Resource Strain: Low Risk    Difficulty of Paying Living Expenses: Not very hard  Food Insecurity: No Food Insecurity   Worried  About Running Out of Food in the Last Year: Never true   [place] in the Last Year: Never true  Transportation Needs: No Transportation Needs   Lack of Transportation (Medical): No   Lack of Transportation (Non-Medical): No  Physical Activity: Inactive   Days of Exercise per Week: 0 days   Minutes of Exercise per Session: 0 min  Stress: No Stress Concern Present   Feeling of Stress : Not at all  Social Connections: Moderately Integrated   Frequency of Communication with Friends and Family: More than three times a week   Frequency of Social Gatherings with Friends and Family: More than three times a week   Attends Religious Services: More than 4 times per year   Active Member of [employer] or Organizations: No   Attends Music therapist: Never   Marital Status: Married  Human resources officer Violence: Not At Risk   Fear of Current or Ex-Partner: No   Emotionally Abused: No   Physically Abused: No   Sexually Abused: No    Family History  Problem Relation Age of Onset   Diabetes Mother    Cancer Mother    Cancer Father     Past Surgical History:  Procedure Laterality Date   BREAST SURGERY     CATARACT EXTRACTION     LEFT AND RIGHT HEART CATHETERIZATION WITH CORONARY ANGIOGRAM N/A 10/23/2014   Procedure: LEFT AND RIGHT HEART CATHETERIZATION WITH CORONARY ANGIOGRAM;  Surgeon: Blane Ohara, MD;  Location: [facility] CATH LAB;  Service: Cardiovascular;  Laterality: N/A;   PERMANENT PACEMAKER INSERTION N/A 10/25/2014   STJ dual chamber PPM implanted by Dr Rayann Heman for SSS   TONSILLECTOMY AND ADENOIDECTOMY     TOTAL KNEE ARTHROPLASTY      ROS: Review of Systems Negative except as stated above  PHYSICAL EXAM: BP (!) 148/70 (BP Location: Right Arm, Patient Position: Sitting, Cuff Size: Large)    Pulse 61    Temp 98.3 F (36.8 C)    Resp 18    Ht 4' 9.99" (1.473 m)    Wt 199 lb (90.3 kg)    SpO2 95%    BMI 41.60 kg/m   Physical Exam HENT:     Head: Normocephalic and  atraumatic.  Eyes:     Extraocular Movements: Extraocular movements intact.     Conjunctiva/sclera: Conjunctivae normal.     Pupils: Pupils are equal, round, and reactive to light.  Cardiovascular:     Rate and Rhythm: Normal rate and regular rhythm.     Pulses: Normal pulses.     Heart sounds: Normal heart sounds.  Pulmonary:     Effort: Pulmonary effort is normal.  Breath sounds: Normal breath sounds.  Musculoskeletal:     Cervical back: Normal range of motion and neck supple.  Neurological:     General: No focal deficit present.     Mental Status: She is alert and oriented to person, place, and time.  Psychiatric:        Mood and Affect: Mood normal.        Behavior: Behavior normal.   Results for orders placed or performed in visit on 09/14/21  POCT glycosylated hemoglobin (Hb A1C)  Result Value Ref Range   Hemoglobin A1C 8.0 (A) 4.0 - 5.6 %   HbA1c POC (<> result, manual entry)     HbA1c, POC (prediabetic range)     HbA1c, POC (controlled diabetic range)      ASSESSMENT AND PLAN: 1. Type 2 diabetes mellitus with diabetic mononeuropathy, without long-term current use of insulin ([place]): - Hemoglobin A1c today at goal at 8.0%, goal < 8%.  - Continue Metformin as prescribed. - Discussed the importance of healthy eating habits, low-carbohydrate diet, low-sugar diet, regular aerobic exercise (at least 150 minutes a week as tolerated) and medication compliance to achieve or maintain control of diabetes. - Update BMP.  - Follow-up with primary provider in 3 months or sooner if needed.  - POCT glycosylated hemoglobin (Hb G2R) - Basic Metabolic Panel - metFORMIN (GLUCOPHAGE-XR) 500 MG 24 hr tablet; Take 1 tablet (500 mg total) by mouth 2 (two) times daily with a meal.  Dispense: 180 tablet; Refill: 0  2. Diabetic mononeuropathy associated with type 2 diabetes mellitus ([place]): - Referral to Neurology for further evaluation and management.  - Ambulatory referral to  Neurology   Patient was given the opportunity to ask questions.  Patient verbalized understanding of the plan and was able to repeat key elements of the plan. Patient was given clear instructions to go to Emergency Department or return to medical center if symptoms don't improve, worsen, or new problems develop.The patient verbalized understanding.   Orders Placed This Encounter  Procedures   Basic Metabolic Panel   Ambulatory referral to Neurology   POCT glycosylated hemoglobin (Hb A1C)    Requested Prescriptions   Signed Prescriptions Disp Refills   metFORMIN (GLUCOPHAGE-XR) 500 MG 24 hr tablet 180 tablet 0    Sig: Take 1 tablet (500 mg total) by mouth 2 (two) times daily with a meal.    Return in about 3 months (around 12/13/2021) for Follow-Up or next available diabetes .  Camillia Herter, NP

## 2021-09-14 ENCOUNTER — Ambulatory Visit (INDEPENDENT_AMBULATORY_CARE_PROVIDER_SITE_OTHER): Payer: Medicare Other | Admitting: Family

## 2021-09-14 ENCOUNTER — Encounter: Payer: Self-pay | Admitting: Family

## 2021-09-14 ENCOUNTER — Other Ambulatory Visit: Payer: Self-pay

## 2021-09-14 VITALS — BP 148/70 | HR 61 | Temp 98.3°F | Resp 18 | Ht <= 58 in | Wt 199.0 lb

## 2021-09-14 DIAGNOSIS — E1141 Type 2 diabetes mellitus with diabetic mononeuropathy: Secondary | ICD-10-CM

## 2021-09-14 DIAGNOSIS — E119 Type 2 diabetes mellitus without complications: Secondary | ICD-10-CM

## 2021-09-14 LAB — POCT GLYCOSYLATED HEMOGLOBIN (HGB A1C): Hemoglobin A1C: 8 % — AB (ref 4.0–5.6)

## 2021-09-14 MED ORDER — METFORMIN HCL ER 500 MG PO TB24: 500.0000 mg | ORAL_TABLET | Freq: Two times a day (BID) | ORAL | 0 refills | Status: DC

## 2021-09-14 NOTE — Patient Instructions (Signed)
Metformin Tablets What is this medication? METFORMIN (met FOR min) treats type 2 diabetes. It controls blood sugar (glucose) and helps your body use insulin effectively. This medication is often combined with changes to diet and exercise. This medicine may be used for other purposes; ask your health care provider or pharmacist if you have questions. COMMON BRAND NAME(S): Glucophage What should I tell my care team before I take this medication? They need to know if you have any of these conditions: Anemia Dehydration Heart disease If you often drink alcohol Kidney disease Liver disease Polycystic ovary syndrome Serious infection or injury Vomiting An unusual or allergic reaction to metformin, other medications, foods, dyes, or preservatives Pregnant or trying to get pregnant Breast-feeding How should I use this medication? Take this medication by mouth with a glass of water. Follow the directions on the prescription label. Take this medication with food. Take your medication at regular intervals. Do not take your medication more often than directed. Do not stop taking except on your care team's advice. Talk to your care team about the use of this medication in children. While this medication may be prescribed for children as young as 48 years of age for selected conditions, precautions do apply. Overdosage: If you think you have taken too much of this medicine contact a poison control center or emergency room at once. NOTE: This medicine is only for you. Do not share this medicine with others. What if I miss a dose? If you miss a dose, take it as soon as you can. If it is almost time for your next dose, take only that dose. Do not take double or extra doses. What may interact with this medication? Do not take this medication with any of the following: Certain contrast medications given before X-rays, CT scans, MRI, or other procedures Dofetilide This medication may also interact with the  following: Acetazolamide Alcohol Certain antivirals for HIV or hepatitis Certain medications for blood pressure, heart disease, irregular heart beat Cimetidine Dichlorphenamide Digoxin Diuretics Estrogens, progestins, or birth control pills Glycopyrrolate Isoniazid Lamotrigine Memantine Methazolamide Metoclopramide Midodrine Niacin Phenothiazines like chlorpromazine, mesoridazine, prochlorperazine, thioridazine Phenytoin Ranolazine Steroid medications like prednisone or cortisone Stimulant medications for attention disorders, weight loss, or to stay awake Thyroid medications Topiramate Trospium Vandetanib Zonisamide This list may not describe all possible interactions. Give your health care provider a list of all the medicines, herbs, non-prescription drugs, or dietary supplements you use. Also tell them if you smoke, drink alcohol, or use illegal drugs. Some items may interact with your medicine. What should I watch for while using this medication? Visit your care team for regular checks on your progress. A test called the HbA1C (A1C) will be monitored. This is a simple blood test. It measures your blood sugar control over the last 2 to 3 months. You will receive this test every 3 to 6 months. Using this medication with insulin or a sulfonylurea may increase your risk of hypoglycemia. Learn how to check your blood sugar. Learn the symptoms of low and high blood sugar and how to manage them. Always carry a quick-source of sugar with you in case you have symptoms of low blood sugar. Examples include hard sugar candy or glucose tablets. Make sure others know that you can choke if you eat or drink when you develop serious symptoms of low blood sugar, such as seizures or unconsciousness. They must get medical help at once. Tell your care team if you have high blood sugar. You might need  to change the dose of your medication. If you are sick or exercising more than usual, you might need  to change the dose of your medication. Do not skip meals. Ask your care team if you should avoid alcohol. Many nonprescription cough and cold products contain sugar or alcohol. These can affect blood sugar. This medication may cause ovulation in premenopausal women who do not have regular monthly periods. This may increase your chances of becoming pregnant. You should not take this medication if you become pregnant or think you may be pregnant. Talk with your care team about your birth control options while taking this medication. Contact your care team right away if you think you are pregnant. If you are going to need surgery, an MRI, CT scan, or other procedure, tell your care team that you are taking this medication. You may need to stop taking this medication before the procedure. Wear a medical ID bracelet or chain, and carry a card that describes your disease and details of your medication and dosage times. This medication may cause a decrease in folic acid and vitamin B12. You should make sure that you get enough vitamins while you are taking this medication. Discuss the foods you eat and the vitamins you take with your care team. What side effects may I notice from receiving this medication? Side effects that you should report to your care team as soon as possible: Allergic reactions--skin rash, itching, hives, swelling of the face, lips, tongue, or throat High lactic acid level--muscle pain or cramps, stomach pain, trouble breathing, general discomfort or fatigue Low vitamin B12 level--pain, tingling, or numbness in the hands or feet, muscle weakness, dizziness, confusion, difficulty concentrating Side effects that usually do not require medical attention (report to your care team if they continue or are bothersome): Diarrhea Gas Headache Metallic taste in mouth Nausea This list may not describe all possible side effects. Call your doctor for medical advice about side effects. You may  report side effects to FDA at 1-800-FDA-1088. Where should I keep my medication? Keep out of the reach of children and pets. Store at room temperature between 15 and 30 degrees C (59 and 86 degrees F). Protect from moisture and light. Get rid of any unused medication after the expiration date. To get rid of medications that are no longer needed or expired: Take the medication to a medication take-back program. Check with your pharmacy or law enforcement to find a location. If you cannot return the medication, check the label or package insert to see if the medication should be thrown out in the garbage or flushed down the toilet. If you are not sure, ask your care team. If it is safe to put in the trash, empty the medication out of the container. Mix the medication with cat litter, dirt, coffee grounds, or other unwanted substance. Seal the mixture in a bag or container. Put it in the trash. NOTE: This sheet is a summary. It may not cover all possible information. If you have questions about this medicine, talk to your doctor, pharmacist, or health care provider.  2022 Elsevier/Gold Standard (2020-08-01 00:00:00)

## 2021-09-14 NOTE — Progress Notes (Signed)
Pt presents for diabetes follow-up  

## 2021-09-14 NOTE — Progress Notes (Signed)
Diabetes discussed in office.

## 2021-09-15 LAB — BASIC METABOLIC PANEL
BUN/Creatinine Ratio: 30 — ABNORMAL HIGH (ref 12–28)
BUN: 22 mg/dL (ref 8–27)
CO2: 25 mmol/L (ref 20–29)
Calcium: 9.7 mg/dL (ref 8.7–10.3)
Chloride: 101 mmol/L (ref 96–106)
Creatinine, Ser: 0.74 mg/dL (ref 0.57–1.00)
Glucose: 155 mg/dL — ABNORMAL HIGH (ref 70–99)
Potassium: 4.7 mmol/L (ref 3.5–5.2)
Sodium: 141 mmol/L (ref 134–144)
eGFR: 80 mL/min/{1.73_m2} (ref >59)

## 2021-09-15 NOTE — Progress Notes (Signed)
Please call patient with update.   Kidney function normal.

## 2021-09-17 ENCOUNTER — Encounter: Payer: Self-pay | Admitting: Neurology

## 2021-09-23 ENCOUNTER — Telehealth: Payer: Self-pay | Admitting: Family

## 2021-09-23 NOTE — Telephone Encounter (Signed)
Pt was not aware that lipid panel was not obtained on 09/14/21 because pt was contacted on 09/17/21 about labs. Advised we will check lipid panel and any other needed labs at her visit in April

## 2021-09-23 NOTE — Telephone Encounter (Signed)
Pt states she has been waiting on a call from the labwork results from 09/14/21 visit.   Number confirmed.

## 2021-09-29 ENCOUNTER — Other Ambulatory Visit: Payer: Self-pay

## 2021-09-29 ENCOUNTER — Ambulatory Visit (INDEPENDENT_AMBULATORY_CARE_PROVIDER_SITE_OTHER): Payer: Medicare Other | Admitting: *Deleted

## 2021-09-29 DIAGNOSIS — Z7901 Long term (current) use of anticoagulants: Secondary | ICD-10-CM | POA: Diagnosis not present

## 2021-09-29 DIAGNOSIS — I480 Paroxysmal atrial fibrillation: Secondary | ICD-10-CM

## 2021-09-29 LAB — POCT INR: INR: 2.1 (ref 2.0–3.0)

## 2021-09-29 NOTE — Patient Instructions (Signed)
Description   Continue taking Warfarin 1 tablet daily except 1.5 tablets on Tuesdays. Continue eating 2 serving of greens per week plus your one day of brussels sprouts. Recheck INR 4 weeks. [facility] [phone]

## 2021-10-12 ENCOUNTER — Other Ambulatory Visit: Payer: Self-pay

## 2021-10-12 ENCOUNTER — Ambulatory Visit (INDEPENDENT_AMBULATORY_CARE_PROVIDER_SITE_OTHER): Payer: Medicare Other | Admitting: Nurse Practitioner

## 2021-10-12 ENCOUNTER — Encounter: Payer: Self-pay | Admitting: Nurse Practitioner

## 2021-10-12 DIAGNOSIS — U071 COVID-19: Secondary | ICD-10-CM | POA: Diagnosis not present

## 2021-10-12 MED ORDER — PREDNISONE 10 MG PO TABS: 10.0000 mg | ORAL_TABLET | Freq: Every day | ORAL | 0 refills | Status: AC

## 2021-10-12 MED ORDER — MOLNUPIRAVIR EUA 200MG CAPSULE: 4.0000 | ORAL_CAPSULE | Freq: Two times a day (BID) | ORAL | 0 refills | Status: AC

## 2021-10-12 NOTE — Patient Instructions (Addendum)
Covid 19 Cough Fever:   Stay well hydrated  Stay active  Deep breathing exercises  May take tylenol or fever or pain  May take mucinex DM twice daily   You are being prescribed MOLNUPIRAVIR for COVID-19 infection.   Please call the pharmacy or go through the drive through vs going inside if you are picking up the mediation yourself to prevent further spread. If prescribed to a [facility] affiliated pharmacy, a pharmacist will bring the medication out to your car.   ADMINISTRATION INSTRUCTIONS: Take with or without food. Swallow the tablets whole. Don't chew, crush, or break the medications because it might not work as well  For each dose of the medication, you should be taking FOUR tablets at one time, TWICE a day   Finish your full five-day course of Molnupiravir even if you feel better before you're done. Stopping this medication too early can make it less effective to prevent severe illness related to COVID19.    Molnupiravir is prescribed for YOU ONLY. Don't share it with others, even if they have similar symptoms as you. This medication might not be right for everyone.   Make sure to take steps to protect yourself and others while you're taking this medication in order to get well soon and to prevent others from getting sick with COVID-19.   COMMON SIDE EFFECTS: Diarrhea Nausea  Dizziness    If your COVID-19 symptoms get worse, get medical help right away. Call 911 if you experience symptoms such as worsening cough, trouble breathing, chest pain that doesn't go away, confusion, a hard time staying awake, and pale or blue-colored skin. This medication won't prevent all COVID-19 cases from getting worse.      Follow up:  Follow up if needed

## 2021-10-12 NOTE — Progress Notes (Signed)
Virtual Visit via Telephone Note  I connected with Tracey Todd on 10/12/21 at  9:20 AM EST by telephone and verified that I am speaking with the correct person using two identifiers.  Location: Patient: home Provider: office   I discussed the limitations, risks, security and privacy concerns of performing an evaluation and management service by telephone and the availability of in person appointments. I also discussed with the patient that there may be a patient responsible charge related to this service. The patient expressed understanding and agreed to proceed.   History of Present Illness:  Patient presents today for telephone visit for sick visit.  Patient states that she did test positive for COVID with a home test today.  She states that her symptoms started this past Friday.  She has been experiencing cough, nasal drainage, chills.  She reports that she did have 102 F fever yesterday.  She has taken Tylenol with moderate relief noted.  She has taken Mucinex with some relief noted.  Patient is at high risk for complications due to age and comorbidities.  We will order molnupiravir for COVID treatment.  Patient was advised that if symptoms worsen to please go to the ED for further evaluation.  Patient was advised to do deep breathing exercises. Denies n/v/d, hemoptysis, PND, chest pain or edema.     Observations/Objective:  Vitals with BMI 09/14/2021 09/14/2021 07/30/2021  Height - 4' 9.992" -  Weight - 199 lbs -  BMI - 41.6 -  Systolic 148 147 656  Diastolic 70 74 86  Pulse - 61 -      Assessment and Plan:  Covid 19 Cough Fever:   Stay well hydrated  Stay active  Deep breathing exercises  May take tylenol or fever or pain  May take mucinex DM twice daily   You are being prescribed MOLNUPIRAVIR for COVID-19 infection.   Please call the pharmacy or go through the drive through vs going inside if you are picking up the mediation yourself to prevent further  spread. If prescribed to a [facility] affiliated pharmacy, a pharmacist will bring the medication out to your car.   ADMINISTRATION INSTRUCTIONS: Take with or without food. Swallow the tablets whole. Don't chew, crush, or break the medications because it might not work as well  For each dose of the medication, you should be taking FOUR tablets at one time, TWICE a day   Finish your full five-day course of Molnupiravir even if you feel better before you're done. Stopping this medication too early can make it less effective to prevent severe illness related to COVID19.    Molnupiravir is prescribed for YOU ONLY. Don't share it with others, even if they have similar symptoms as you. This medication might not be right for everyone.   Make sure to take steps to protect yourself and others while you're taking this medication in order to get well soon and to prevent others from getting sick with COVID-19.   COMMON SIDE EFFECTS: Diarrhea Nausea  Dizziness    If your COVID-19 symptoms get worse, get medical help right away. Call 911 if you experience symptoms such as worsening cough, trouble breathing, chest pain that doesn't go away, confusion, a hard time staying awake, and pale or blue-colored skin. This medication won't prevent all COVID-19 cases from getting worse.      Follow up:  Follow up if needed     I discussed the assessment and treatment plan with the patient. The patient was  provided an opportunity to ask questions and all were answered. The patient agreed with the plan and demonstrated an understanding of the instructions.   The patient was advised to call back or seek an in-person evaluation if the symptoms worsen or if the condition fails to improve as anticipated.  I provided 23 minutes of non-face-to-face time during this encounter.   Ivonne Andrew, NP

## 2021-10-12 NOTE — Progress Notes (Deleted)
Patient reported to the office for chest xray. She was found to be extremely short of breath and xray showed large pleural effusion. EMS was called for patient to be taken to Hospital.

## 2021-10-27 ENCOUNTER — Ambulatory Visit: Payer: Medicare Other | Admitting: *Deleted

## 2021-10-27 ENCOUNTER — Other Ambulatory Visit: Payer: Self-pay

## 2021-10-27 DIAGNOSIS — I480 Paroxysmal atrial fibrillation: Secondary | ICD-10-CM | POA: Diagnosis not present

## 2021-10-27 DIAGNOSIS — Z7901 Long term (current) use of anticoagulants: Secondary | ICD-10-CM

## 2021-10-27 LAB — POCT INR: INR: 3 (ref 2.0–3.0)

## 2021-10-27 NOTE — Patient Instructions (Signed)
Description   Today take 1 tablet then continue taking Warfarin 1 tablet daily except 1.5 tablets on Tuesdays. Continue eating 2 serving of greens per week plus your one day of brussels sprouts. Recheck INR 4 weeks. [facility]in Clinic [phone]

## 2021-11-06 ENCOUNTER — Ambulatory Visit (INDEPENDENT_AMBULATORY_CARE_PROVIDER_SITE_OTHER): Payer: Medicare Other | Admitting: Nurse Practitioner

## 2021-11-06 ENCOUNTER — Other Ambulatory Visit: Payer: Self-pay

## 2021-11-06 ENCOUNTER — Encounter: Payer: Self-pay | Admitting: Nurse Practitioner

## 2021-11-06 VITALS — BP 160/78 | HR 61 | Ht <= 58 in | Wt 202.0 lb

## 2021-11-06 DIAGNOSIS — H6121 Impacted cerumen, right ear: Secondary | ICD-10-CM | POA: Insufficient documentation

## 2021-11-06 NOTE — Assessment & Plan Note (Signed)
Will order ear wash ? ?Follow up: ? ?Follow up if needed ?

## 2021-11-06 NOTE — Progress Notes (Signed)
Right ear stopped up. ?

## 2021-11-06 NOTE — Patient Instructions (Signed)
1. Right ear impacted cerumen  Will order ear wash  Follow up:  Follow up if needed  Earwax Buildup, Adult The ears produce a substance called earwax that helps keep bacteria out of the ear and protects the skin in the ear canal. Occasionally, earwax can build up in the ear and cause discomfort or hearing loss. What are the causes? This condition is caused by a buildup of earwax. Ear canals are self-cleaning. Ear wax is made in the outer part of the ear canal and generally falls out in small amounts over time. When the self-cleaning mechanism is not working, earwax builds up and can cause decreased hearing and discomfort. Attempting to clean ears with cotton swabs can push the earwax deep into the ear canal and cause decreased hearing and pain. What increases the risk? This condition is more likely to develop in people who: Clean their ears often with cotton swabs. Pick at their ears. Use earplugs or in-ear headphones often, or wear hearing aids. The following factors may also make you more likely to develop this condition: Being female. Being of older age. Naturally producing more earwax. Having narrow ear canals. Having earwax that is overly thick or sticky. Having excess hair in the ear canal. Having eczema. Being dehydrated. What are the signs or symptoms? Symptoms of this condition include: Reduced or muffled hearing. A feeling of fullness in the ear or feeling that the ear is plugged. Fluid coming from the ear. Ear pain or an itchy ear. Ringing in the ear. Coughing. Balance problems. An obvious piece of earwax that can be seen inside the ear canal. How is this diagnosed? This condition may be diagnosed based on: Your symptoms. Your medical history. An ear exam. During the exam, your health care provider will look into your ear with an instrument called an otoscope. You may have tests, including a hearing test. How is this treated? This condition may be treated  by: Using ear drops to soften the earwax. Having the earwax removed by a health care provider. The health care provider may: Flush the ear with water. Use an instrument that has a loop on the end (curette). Use a suction device. Having surgery to remove the wax buildup. This may be done in severe cases. Follow these instructions at home:  Take over-the-counter and prescription medicines only as told by your health care provider. Do not put any objects, including cotton swabs, into your ear. You can clean the opening of your ear canal with a washcloth or facial tissue. Follow instructions from your health care provider about cleaning your ears. Do not overclean your ears. Drink enough fluid to keep your urine pale yellow. This will help to thin the earwax. Keep all follow-up visits as told. If earwax builds up in your ears often or if you use hearing aids, consider seeing your health care provider for routine, preventive ear cleanings. Ask your health care provider how often you should schedule your cleanings. If you have hearing aids, clean them according to instructions from the manufacturer and your health care provider. Contact a health care provider if: You have ear pain. You develop a fever. You have pus or other fluid coming from your ear. You have hearing loss. You have ringing in your ears that does not go away. You feel like the room is spinning (vertigo). Your symptoms do not improve with treatment. Get help right away if: You have bleeding from the affected ear. You have severe ear pain. Summary Earwax can  build up in the ear and cause discomfort or hearing loss. The most common symptoms of this condition include reduced or muffled hearing, a feeling of fullness in the ear, or feeling that the ear is plugged. This condition may be diagnosed based on your symptoms, your medical history, and an ear exam. This condition may be treated by using ear drops to soften the earwax or  by having the earwax removed by a health care provider. Do not put any objects, including cotton swabs, into your ear. You can clean the opening of your ear canal with a washcloth or facial tissue. This information is not intended to replace advice given to you by your health care provider. Make sure you discuss any questions you have with your health care provider. Document Revised: 12/04/2019 Document Reviewed: 12/04/2019 Elsevier Patient Education  [place] Irrigation Ear irrigation is a procedure to wash dirt and wax out of your ear canal. This procedure is also called lavage. You may need ear irrigation if you are having trouble hearing because of a buildup of earwax. You may also have ear irrigation as part of the treatment for an ear infection. Getting wax and dirt out of your ear canal can help ear drops work better. Tell a health care provider about: Any allergies you have. All medicines you are taking, including vitamins, herbs, eye drops, creams, and over-the-counter medicines. Any problems you or family members have had with anesthetic medicines. Any blood disorders you have. Any surgeries you have had. This includes any ear surgeries. Any medical conditions you have. Whether you are pregnant or may be pregnant. What are the risks? Generally, this is a safe procedure. However, problems may occur, including: Infection. Pain. Hearing loss. Fluid and debris being pushed through the eardrum and into the middle ear. This can occur if there are holes in the eardrum. Ear irrigation failing to work. What happens before the procedure? You will talk with your provider about the procedure and plan. You may be given ear drops to put in your ear 15-20 minutes before irrigation. This helps loosen the wax. What happens during the procedure?  A syringe is filled with water or saline solution, which is made of salt and water. The syringe is gently inserted into the ear  canal. The fluid is used to flush out wax and other debris. The procedure may vary among health care providers and hospitals. What can I expect after the procedure? After an ear irrigation, follow instructions given to you by your health care provider. Follow these instructions at home: Using ear irrigation kits Ear irrigation kits are available for use at home. Ask your health care provider if this is an option for you. In general, you should: Use a home irrigation kit only as told by your health care provider. Read the package instructions carefully. Follow the directions for using the syringe. Use water that is room temperature. Do not do ear irrigation at home if you: Have diabetes. Diabetes increases the risk of infection. Have a hole or tear in your eardrum. Have tubes in your ears. Have had any ear surgery in the past. Have been told not to irrigate your ears. Cleaning your ears  Clean the outside of your ear with a soft washcloth daily. If told by your health care provider, use a few drops of baby oil, mineral oil, glycerin, hydrogen peroxide, or over-the-counter earwax softening drops. Do not use cotton swabs to clean your ears. These can push  wax down into the ear canal. Do not put anything into your ears to try to remove wax. This includes ear candles. General instructions Take over-the-counter and prescription medicines only as told by your health care provider. If you were prescribed an antibiotic medicine, use it as told by your health care provider. Do not stop using the antibiotic even if your condition improves. Keep the ear clean and dry by following the instructions from your health care provider. Keep all follow-up visits. This is important. Visit your health care provider at least once a year to have your ears and hearing checked. Contact a health care provider if: Your hearing is not improving or is getting worse. You have pain or redness in your ear. You are  dizzy. You have ringing in your ears. You have nausea or vomiting. You have fluid, blood, or pus coming out of your ear. Summary Ear irrigation is a procedure to wash dirt and wax out of your ear canal. This procedure is also called lavage. To perform ear irrigation, ear drops may be put in your ear 15-20 minutes before irrigation. Water or saline solution will be used to flush out earwax and other debris. You may be able to irrigate your ears at home. Ask your health care provider if this is an option for you. Follow your health care provider's instructions. Clean your ears with a soft cloth after irrigation. Do not use cotton swabs to clean your ears. These can push wax down into the ear canal. This information is not intended to replace advice given to you by your health care provider. Make sure you discuss any questions you have with your health care provider. Document Revised: 12/04/2019 Document Reviewed: 12/04/2019 Elsevier Patient Education  [place].

## 2021-11-06 NOTE — Progress Notes (Signed)
$'@Patient'L$  ID: Tracey Todd, female    DOB: [birth year], 87 y.o.   MRN: [id number]  Chief Complaint  Patient presents with   Ear Fullness    Referring provider: Camillia Herter, NP  HPI   Patient presents today with issues related to her right ear.  She states that for the past month her right ear has felt stopped up and she is having difficulty hearing out of the ear.  She states that occasionally she does struggle with impacted cerumen.  Upon exam her ear does have impacted cerumen and we will order a ear wash today. Denies f/c/s, n/v/d, hemoptysis, PND, chest pain or edema.     Allergies  Allergen Reactions   Penicillins Anaphylaxis   Quinapril     Angioedema    Feldene [Piroxicam] Other (See Comments)    Bleeding    Calcium-Containing Compounds Other (See Comments)    "calcium deposits on skin"   Celebrex [Celecoxib] Itching   Daypro [Oxaprozin] Itching    Immunization History  Administered Date(s) Administered   Influenza Split 05/23/2013   Influenza, High Dose Seasonal PF 05/30/2014, 05/19/2015, 06/02/2016, 05/26/2017   Influenza,inj,Quad PF,6+ Mos 05/16/2019, 05/19/2020   Influenza-Unspecified 06/02/2018, 06/02/2021   PFIZER(Purple Top)SARS-COV-2 Vaccination 10/26/2019, 11/20/2019   Pneumococcal Conjugate-13 08/06/2015, 04/09/2019   Pneumococcal Polysaccharide-23 07/29/2013   Td 08/31/2011    Past Medical History:  Diagnosis Date   Atrial flutter ([facility])    a. mentioned in 2016 admission.   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 minimal by cath 2016.   Nodule of chest wall    Normal coronary arteries and LVF 10/23/14 10/24/2014   Pacemaker implanted 10/25/14- [employer] 10/26/2014   Paroxysmal atrial fibrillation (HCC)    a. identified on device interrogation, burden low   Pulmonary hypertension  (HCC)    Sick sinus syndrome ([facility])    a. s/p STJ dual chamber PPM    Type II or unspecified type diabetes mellitus without mention of complication, not stated as uncontrolled    Vitamin D deficiency     Tobacco History: Social History   Tobacco Use  Smoking Status Former   Types: Cigarettes   Quit date: 12/06/1963   Years since quitting: 57.9  Smokeless Tobacco Never   Counseling given: Not Answered   Outpatient Encounter Medications as of 11/06/2021  Medication Sig   amLODipine (NORVASC) 5 MG tablet Take 1 tablet by mouth once daily   atorvastatin (LIPITOR) 80 MG tablet Take 0.5 tablets (40 mg total) by mouth daily.   bisoprolol (ZEBETA) 10 MG tablet Take 1 tablet by mouth once daily for blood pressure   Blood Glucose Monitoring Suppl (ONETOUCH VERIO) w/Device KIT Use to check fasting blood sugar daily. Dx: E11.59   calcium carbonate (OS-CAL) 600 MG TABS tablet Take 600 mg by mouth daily with breakfast.   cetirizine (ZYRTEC) 10 MG tablet Take 10 mg by mouth every other day.    Cholecalciferol (VITAMIN D) 2000 UNITS CAPS Take 6,000 Units by mouth daily.    glucose blood (ONETOUCH VERIO) test strip Use to check fasting blood sugar daily. Dx: E11.59   Magnesium 500 MG TABS Take 500 mg by mouth 4 (four) times daily.    metFORMIN (GLUCOPHAGE-XR) 500 MG 24 hr tablet Take 1 tablet (500 mg  total) by mouth 2 (two) times daily with a meal.   OneTouch Delica Lancets 41D MISC Use to check fasting blood sugar daily. Dx: E11.59   warfarin (COUMADIN) 5 MG tablet AS DIRECTED BY  COUMADIN  CLINIC   furosemide (LASIX) 40 MG tablet Take 1 tablet (40 mg total) by mouth daily.   potassium chloride SA (KLOR-CON) 20 MEQ tablet Take 1 tablet (20 mEq total) by mouth daily.   No facility-administered encounter medications on file as of 11/06/2021.     Review of Systems  Review of Systems  Constitutional: Negative.   HENT:  Positive for hearing loss (right ear).   Cardiovascular: Negative.    Gastrointestinal: Negative.   Allergic/Immunologic: Negative.   Neurological: Negative.   Psychiatric/Behavioral: Negative.        Physical Exam  BP (!) 160/78    Pulse 61    Ht $R'4\' 9"'do$  (1.448 m)    Wt 202 lb (91.6 kg)    SpO2 93%    BMI 43.71 kg/m   Wt Readings from Last 5 Encounters:  11/06/21 202 lb (91.6 kg)  09/14/21 199 lb (90.3 kg)  07/30/21 200 lb 3.2 oz (90.8 kg)  06/08/21 199 lb (90.3 kg)  04/27/21 203 lb 6.4 oz (92.3 kg)     Physical Exam Vitals and nursing note reviewed.  Constitutional:      General: She is not in acute distress.    Appearance: She is well-developed.  HENT:     Right Ear: There is impacted cerumen.     Left Ear: There is no impacted cerumen.  Cardiovascular:     Rate and Rhythm: Normal rate and regular rhythm.  Pulmonary:     Effort: Pulmonary effort is normal.     Breath sounds: Normal breath sounds.  Neurological:     Mental Status: She is alert and oriented to person, place, and time.     Lab Results:  CBC    Component Value Date/Time   WBC 5.5 08/18/2020 0915   WBC 6.1 06/02/2018 0950   RBC 4.52 08/18/2020 0915   RBC 4.27 06/02/2018 0950   HGB 12.1 08/18/2020 0915   HCT 37.6 08/18/2020 0915   PLT 309 08/18/2020 0915   MCV 83 08/18/2020 0915   MCH 26.8 08/18/2020 0915   MCH 26.7 (L) 06/02/2018 0950   MCHC 32.2 08/18/2020 0915   MCHC 31.9 (L) 06/02/2018 0950   RDW 15.6 (H) 08/18/2020 0915   LYMPHSABS 1.7 08/18/2020 0915   MONOABS 684 03/28/2017 0907   EOSABS 0.1 08/18/2020 0915   BASOSABS 0.1 08/18/2020 0915    BMET    Component Value Date/Time   NA 141 09/14/2021 1127   K 4.7 09/14/2021 1127   CL 101 09/14/2021 1127   CO2 25 09/14/2021 1127   GLUCOSE 155 (H) 09/14/2021 1127   GLUCOSE 109 (H) 06/02/2018 0950   BUN 22 09/14/2021 1127   CREATININE 0.74 09/14/2021 1127   CREATININE 0.63 06/02/2018 0950   CALCIUM 9.7 09/14/2021 1127   GFRNONAA 80 08/18/2020 0915   GFRNONAA 84 06/02/2018 0950   GFRAA 93  08/18/2020 0915   GFRAA 97 06/02/2018 0950    BNP    Component Value Date/Time   BNP 649.3 (H) 10/21/2014 1729    ProBNP No results found for: PROBNP  Imaging: No results found.   Assessment & Plan:   Right ear impacted cerumen Will order ear wash  Follow up:  Follow up if needed   Patient Instructions  1.  Right ear impacted cerumen  Will order ear wash  Follow up:  Follow up if needed  Earwax Buildup, Adult The ears produce a substance called earwax that helps keep bacteria out of the ear and protects the skin in the ear canal. Occasionally, earwax can build up in the ear and cause discomfort or hearing loss. What are the causes? This condition is caused by a buildup of earwax. Ear canals are self-cleaning. Ear wax is made in the outer part of the ear canal and generally falls out in small amounts over time. When the self-cleaning mechanism is not working, earwax builds up and can cause decreased hearing and discomfort. Attempting to clean ears with cotton swabs can push the earwax deep into the ear canal and cause decreased hearing and pain. What increases the risk? This condition is more likely to develop in people who: Clean their ears often with cotton swabs. Pick at their ears. Use earplugs or in-ear headphones often, or wear hearing aids. The following factors may also make you more likely to develop this condition: Being female. Being of older age. Naturally producing more earwax. Having narrow ear canals. Having earwax that is overly thick or sticky. Having excess hair in the ear canal. Having eczema. Being dehydrated. What are the signs or symptoms? Symptoms of this condition include: Reduced or muffled hearing. A feeling of fullness in the ear or feeling that the ear is plugged. Fluid coming from the ear. Ear pain or an itchy ear. Ringing in the ear. Coughing. Balance problems. An obvious piece of earwax that can be seen inside the ear  canal. How is this diagnosed? This condition may be diagnosed based on: Your symptoms. Your medical history. An ear exam. During the exam, your health care provider will look into your ear with an instrument called an otoscope. You may have tests, including a hearing test. How is this treated? This condition may be treated by: Using ear drops to soften the earwax. Having the earwax removed by a health care provider. The health care provider may: Flush the ear with water. Use an instrument that has a loop on the end (curette). Use a suction device. Having surgery to remove the wax buildup. This may be done in severe cases. Follow these instructions at home:  Take over-the-counter and prescription medicines only as told by your health care provider. Do not put any objects, including cotton swabs, into your ear. You can clean the opening of your ear canal with a washcloth or facial tissue. Follow instructions from your health care provider about cleaning your ears. Do not overclean your ears. Drink enough fluid to keep your urine pale yellow. This will help to thin the earwax. Keep all follow-up visits as told. If earwax builds up in your ears often or if you use hearing aids, consider seeing your health care provider for routine, preventive ear cleanings. Ask your health care provider how often you should schedule your cleanings. If you have hearing aids, clean them according to instructions from the manufacturer and your health care provider. Contact a health care provider if: You have ear pain. You develop a fever. You have pus or other fluid coming from your ear. You have hearing loss. You have ringing in your ears that does not go away. You feel like the room is spinning (vertigo). Your symptoms do not improve with treatment. Get help right away if: You have bleeding from the affected ear. You have severe ear pain. Summary Earwax can build  up in the ear and cause discomfort or  hearing loss. The most common symptoms of this condition include reduced or muffled hearing, a feeling of fullness in the ear, or feeling that the ear is plugged. This condition may be diagnosed based on your symptoms, your medical history, and an ear exam. This condition may be treated by using ear drops to soften the earwax or by having the earwax removed by a health care provider. Do not put any objects, including cotton swabs, into your ear. You can clean the opening of your ear canal with a washcloth or facial tissue. This information is not intended to replace advice given to you by your health care provider. Make sure you discuss any questions you have with your health care provider. Document Revised: 12/04/2019 Document Reviewed: 12/04/2019 Elsevier Patient Education  [place] Irrigation Ear irrigation is a procedure to wash dirt and wax out of your ear canal. This procedure is also called lavage. You may need ear irrigation if you are having trouble hearing because of a buildup of earwax. You may also have ear irrigation as part of the treatment for an ear infection. Getting wax and dirt out of your ear canal can help ear drops work better. Tell a health care provider about: Any allergies you have. All medicines you are taking, including vitamins, herbs, eye drops, creams, and over-the-counter medicines. Any problems you or family members have had with anesthetic medicines. Any blood disorders you have. Any surgeries you have had. This includes any ear surgeries. Any medical conditions you have. Whether you are pregnant or may be pregnant. What are the risks? Generally, this is a safe procedure. However, problems may occur, including: Infection. Pain. Hearing loss. Fluid and debris being pushed through the eardrum and into the middle ear. This can occur if there are holes in the eardrum. Ear irrigation failing to work. What happens before the procedure? You will  talk with your provider about the procedure and plan. You may be given ear drops to put in your ear 15-20 minutes before irrigation. This helps loosen the wax. What happens during the procedure?  A syringe is filled with water or saline solution, which is made of salt and water. The syringe is gently inserted into the ear canal. The fluid is used to flush out wax and other debris. The procedure may vary among health care providers and hospitals. What can I expect after the procedure? After an ear irrigation, follow instructions given to you by your health care provider. Follow these instructions at home: Using ear irrigation kits Ear irrigation kits are available for use at home. Ask your health care provider if this is an option for you. In general, you should: Use a home irrigation kit only as told by your health care provider. Read the package instructions carefully. Follow the directions for using the syringe. Use water that is room temperature. Do not do ear irrigation at home if you: Have diabetes. Diabetes increases the risk of infection. Have a hole or tear in your eardrum. Have tubes in your ears. Have had any ear surgery in the past. Have been told not to irrigate your ears. Cleaning your ears  Clean the outside of your ear with a soft washcloth daily. If told by your health care provider, use a few drops of baby oil, mineral oil, glycerin, hydrogen peroxide, or over-the-counter earwax softening drops. Do not use cotton swabs to clean your ears. These can push wax  down into the ear canal. Do not put anything into your ears to try to remove wax. This includes ear candles. General instructions Take over-the-counter and prescription medicines only as told by your health care provider. If you were prescribed an antibiotic medicine, use it as told by your health care provider. Do not stop using the antibiotic even if your condition improves. Keep the ear clean and dry by  following the instructions from your health care provider. Keep all follow-up visits. This is important. Visit your health care provider at least once a year to have your ears and hearing checked. Contact a health care provider if: Your hearing is not improving or is getting worse. You have pain or redness in your ear. You are dizzy. You have ringing in your ears. You have nausea or vomiting. You have fluid, blood, or pus coming out of your ear. Summary Ear irrigation is a procedure to wash dirt and wax out of your ear canal. This procedure is also called lavage. To perform ear irrigation, ear drops may be put in your ear 15-20 minutes before irrigation. Water or saline solution will be used to flush out earwax and other debris. You may be able to irrigate your ears at home. Ask your health care provider if this is an option for you. Follow your health care provider's instructions. Clean your ears with a soft cloth after irrigation. Do not use cotton swabs to clean your ears. These can push wax down into the ear canal. This information is not intended to replace advice given to you by your health care provider. Make sure you discuss any questions you have with your health care provider. Document Revised: 12/04/2019 Document Reviewed: 12/04/2019 Elsevier Patient Education  2022 [place], [place] 11/06/2021

## 2021-11-18 ENCOUNTER — Ambulatory Visit (INDEPENDENT_AMBULATORY_CARE_PROVIDER_SITE_OTHER): Payer: Medicare Other | Admitting: Podiatry

## 2021-11-18 ENCOUNTER — Other Ambulatory Visit: Payer: Self-pay

## 2021-11-18 ENCOUNTER — Encounter: Payer: Self-pay | Admitting: Podiatry

## 2021-11-18 DIAGNOSIS — E1121 Type 2 diabetes mellitus with diabetic nephropathy: Secondary | ICD-10-CM

## 2021-11-18 DIAGNOSIS — M79676 Pain in unspecified toe(s): Secondary | ICD-10-CM | POA: Diagnosis not present

## 2021-11-18 DIAGNOSIS — B351 Tinea unguium: Secondary | ICD-10-CM

## 2021-11-18 DIAGNOSIS — D689 Coagulation defect, unspecified: Secondary | ICD-10-CM | POA: Diagnosis not present

## 2021-11-18 NOTE — Progress Notes (Signed)
This patient returns to my office for at risk foot care.  This patient requires this care by a professional since this patient will be at risk due to having diabetes and coagulation defect.  Patient is taking coumadin.  This patient is unable to cut nails herself since the patient cannot reach her nails.These nails are painful walking and wearing shoes.  This patient presents for at risk foot care today.  General Appearance  Alert, conversant and in no acute stress.  Vascular  Dorsalis pedis and posterior tibial  pulses are weakly  palpable  bilaterally.  Capillary return is within normal limits  bilaterally. Temperature is within normal limits  Bilaterally. Absent hair.  Neurologic  Senn-Weinstein monofilament wire test within normal limits  bilaterally. Muscle power within normal limits bilaterally.  Nails Thick disfigured discolored nails with subungual debris  from hallux to fifth toes bilaterally. No evidence of bacterial infection or drainage bilaterally.  Orthopedic  No limitations of motion  feet .  No crepitus or effusions noted.  No bony pathology or digital deformities noted.  Skin  normotropic skin with no porokeratosis noted bilaterally.  No signs of infections or ulcers noted.     Onychomycosis  Pain in right toes  Pain in left toes  Consent was obtained for treatment procedures.   Mechanical debridement of nails 1-5  bilaterally performed with a nail nipper. No dremel usage.   Return office visit   3 months                   Told patient to return for periodic foot care and evaluation due to potential at risk complications.   Layla Gramm DPM  

## 2021-11-20 ENCOUNTER — Encounter: Payer: Self-pay | Admitting: Neurology

## 2021-11-20 ENCOUNTER — Other Ambulatory Visit: Payer: Self-pay

## 2021-11-20 ENCOUNTER — Ambulatory Visit: Payer: Medicare Other | Admitting: Neurology

## 2021-11-20 VITALS — BP 167/77 | HR 72 | Ht <= 58 in | Wt 200.0 lb

## 2021-11-20 DIAGNOSIS — E1142 Type 2 diabetes mellitus with diabetic polyneuropathy: Secondary | ICD-10-CM

## 2021-11-20 DIAGNOSIS — M48062 Spinal stenosis, lumbar region with neurogenic claudication: Secondary | ICD-10-CM | POA: Diagnosis not present

## 2021-11-20 MED ORDER — GABAPENTIN 300 MG PO CAPS: 300.0000 mg | ORAL_CAPSULE | Freq: Every day | ORAL | 1 refills | Status: DC

## 2021-11-20 NOTE — Patient Instructions (Signed)
Start gabapentin 300mg  at bedtime ? ?Start home physical therapy ? ?CT lumbar spine without contrast ? ?Return to clinic 6 months ? ? ?

## 2021-11-20 NOTE — Progress Notes (Signed)
?[facility] ?Neurology Division ?Clinic Note - Initial Visit ? ? ?Date: 11/20/21 ? ?Tracey Todd ?MRN: [id number] ?DOB: 06/23/1935 ? ? ?Dear Tracey Fruits, NP: ? ?Thank you for your kind referral of Tracey Todd for consultation of bilateral leg numbness. Although her history is well known to you, please allow [place] to reiterate it for the purpose of our medical record. The patient was accompanied to the clinic by daughter Tracey Todd, [place]) who also provides collateral information.  ?  ? ?History of Present Illness: ?Tracey Todd is a 87 y.o. left-handed female with PAF, sick sinus syndrome s/p PPM, CHF, diabetes mellitus, and lumbar spondylosis presenting for evaluation of bilateral leg numbness.  ? ?For the past several years she has numbness involving the feet and lateral leg, which has become worse over the past 6 months.  It occurs about 2-3 times per week which lasts several hours.  With prolonged standing and walking, she gets burning sensation over the side of her hips which radiates down her leg and into the top of her feet. She was on gabapentin $RemoveBefor'100mg'jdvyCttXDtWd$ , but does not take this anymore. Several years ago, she was evaluated by Tracey Todd and had ESI for lumbar radiculopathy.  She recalls pain was better controlled on gabapentin $RemoveBefor'300mg'GPRWlbRKjgds$  at bedtime. CT myelogram from 2017 shows moderate central canal stenosis ta L4-5 and disc protrusion at L1-2.  ? ?She lives at home with her husband in a one-level home.  Her daughter is primary caregiver.  Patient does not drive.  She has 5 living children  ? ?Out-side paper records, electronic medical record, and images have been reviewed where available and summarized as:  ?CT myelogram 10/07/2015: ?LUMBAR MYELOGRAM IMPRESSION: ?1. Moderate central canal stenosis at L4-5 with dynamic anterolisthesis, worse on flexion and partially reduced in extension. ?2. Slight anterolisthesis and a broad-based disc protrusion at L3-4 are worse with flexion. ?3. Mild  subarticular narrowing is present bilaterally at L1-2 and worse on the right at L2-3. ?4. Transitional T12 and L5 anatomy. There is sacralization of the L5 vertebral body. ? ?Lab Results  ?Component Value Date  ? HGBA1C 8.0 (A) 09/14/2021  ? ?No results found for: VITAMINB12 ?Lab Results  ?Component Value Date  ? TSH 3.900 08/22/2018  ? ?No results found for: ESRSEDRATE, POCTSEDRATE ? ?Past Medical History:  ?Diagnosis Date  ? Atrial flutter ([place])   ? a. mentioned in 2016 admission.  ? Chronic diastolic CHF (congestive heart failure) ([facility])   ? DJD (degenerative joint disease)   ? Fatty liver   ? H/O total knee replacement 02/13/2013  ? HTN (hypertension)   ? Hypercholesteremia   ? Mild CAD   ? a. minimal by cath 2016.  ? Nodule of chest wall   ? Normal coronary arteries and LVF 10/23/14 10/24/2014  ? Pacemaker implanted 10/25/14- [employer] Jude 10/26/2014  ? Paroxysmal atrial fibrillation (HCC)   ? a. identified on device interrogation, burden low  ? Pulmonary hypertension ([place])   ? Sick sinus syndrome ([place])   ? a. s/p STJ dual chamber PPM   ? Type II or unspecified type diabetes mellitus without mention of complication, not stated as uncontrolled   ? Vitamin D deficiency   ? ? ?Past Surgical History:  ?Procedure Laterality Date  ? BREAST SURGERY    ? CATARACT EXTRACTION    ? LEFT AND RIGHT HEART CATHETERIZATION WITH CORONARY ANGIOGRAM N/A 10/23/2014  ? Procedure: LEFT AND RIGHT HEART CATHETERIZATION WITH CORONARY ANGIOGRAM;  Surgeon: Blane Ohara,  MD;  Location: [facility] CATH LAB;  Service: Cardiovascular;  Laterality: N/A;  ? PERMANENT PACEMAKER INSERTION N/A 10/25/2014  ? STJ dual chamber PPM implanted by Dr Rayann Heman for SSS  ? TONSILLECTOMY AND ADENOIDECTOMY    ? TOTAL KNEE ARTHROPLASTY    ? ? ? ?Medications:  ?Outpatient Encounter Medications as of 11/20/2021  ?Medication Sig  ? amLODipine (NORVASC) 5 MG tablet Take 1 tablet by mouth once daily  ? atorvastatin (LIPITOR) 80 MG tablet Take 0.5 tablets (40 mg total) by mouth daily.   ? bisoprolol (ZEBETA) 10 MG tablet Take 1 tablet by mouth once daily for blood pressure  ? Blood Glucose Monitoring Suppl (ONETOUCH VERIO) w/Device KIT Use to check fasting blood sugar daily. Dx: E11.59  ? calcium carbonate (OS-CAL) 600 MG TABS tablet Take 600 mg by mouth daily with breakfast.  ? cetirizine (ZYRTEC) 10 MG tablet Take 10 mg by mouth every other day.   ? Cholecalciferol (VITAMIN D) 2000 UNITS CAPS Take 6,000 Units by mouth daily.   ? furosemide (LASIX) 40 MG tablet Take 1 tablet (40 mg total) by mouth daily.  ? glucose blood (ONETOUCH VERIO) test strip Use to check fasting blood sugar daily. Dx: E11.59  ? Magnesium 500 MG TABS Take 500 mg by mouth 4 (four) times daily.   ? metFORMIN (GLUCOPHAGE-XR) 500 MG 24 hr tablet Take 1 tablet (500 mg total) by mouth 2 (two) times daily with a meal.  ? OneTouch Delica Lancets 67M MISC Use to check fasting blood sugar daily. Dx: E11.59  ? potassium chloride SA (KLOR-CON) 20 MEQ tablet Take 1 tablet (20 mEq total) by mouth daily.  ? warfarin (COUMADIN) 5 MG tablet AS DIRECTED BY  COUMADIN  CLINIC  ? gabapentin (NEURONTIN) 100 MG capsule SMARTSIG:Applicator By Mouth (Patient not taking: Reported on 11/20/2021)  ? ?No facility-administered encounter medications on file as of 11/20/2021.  ? ? ?Allergies:  ?Allergies  ?Allergen Reactions  ? Penicillins Anaphylaxis  ? Quinapril   ?  Angioedema ?  ? Feldene [Piroxicam] Other (See Comments)  ?  Bleeding   ? Calcium-Containing Compounds Other (See Comments)  ?  "calcium deposits on skin"  ? Celebrex [Celecoxib] Itching  ? Daypro [Oxaprozin] Itching  ? ? ?Family History: ?Family History  ?Problem Relation Age of Onset  ? Diabetes Mother   ? Cancer Mother   ? Cancer Father   ? ? ?Social History: ?Social History  ? ?Tobacco Use  ? Smoking status: Former  ?  Types: Cigarettes  ?  Quit date: 12/06/1963  ?  Years since quitting: 57.9  ? Smokeless tobacco: Never  ?Vaping Use  ? Vaping Use: Never used  ?Substance Use Topics  ?  Alcohol use: No  ? Drug use: Never  ? ?Social History  ? ?Social History Narrative  ? Left Handed   ? Lives in a one story home   ? ? ?Vital Signs:  ?BP (!) 167/77   Pulse 72   Ht $R'4\' 9"'ah$  (1.448 m)   Wt 200 lb (90.7 kg)   SpO2 93%   BMI 43.28 kg/m?  ? ? ?Neurological Exam: ?MENTAL STATUS including orientation to time, place, person, recent and remote memory, attention span and concentration, language, and fund of knowledge is normal.  Speech is not dysarthric. ? ?CRANIAL NERVES: ?II:  No visual field defects.   ?III-IV-VI: Pupils equal round and reactive to light.  Normal conjugate, extra-ocular eye movements in all directions of gaze.  No nystagmus.  No ptosis.   ?  V:  Normal facial sensation.    ?VII:  Normal facial symmetry and movements.   ?VIII:  Normal hearing and vestibular function.   ?IX-X:  Normal palatal movement.   ?XI:  Normal shoulder shrug and head rotation.   ?XII:  Normal tongue strength and range of motion, no deviation or fasciculation. ? ?MOTOR:  No atrophy, fasciculations or abnormal movements.  No pronator drift.  ? ?Upper Extremity:  Right  Left  ?Deltoid  5/5   5/5   ?Biceps  5/5   5/5   ?Triceps  5/5   5/5   ?Wrist extensors  5/5   5/5   ?Wrist flexors  5/5   5/5   ?Finger extensors  5/5   5/5   ?Finger flexors  5/5   5/5   ?Dorsal interossei  5/5   5/5   ?Abductor pollicis  5/5   5/5   ?Tone (Ashworth scale)  0  0  ? ?Lower Extremity:  Right  Left  ?Hip flexors  5/5   5-/5   ?Knee flexors  5/5   5-/5   ?Knee extensors  5/5   5/5   ?Dorsiflexors  5/5   5/5   ?Plantarflexors  5/5   5/5   ?Toe extensors  5/5   5/5   ?Toe flexors  5/5   5/5   ?Tone (Ashworth scale)  0  0  ? ?MSRs:  ?Right        Left                  ?brachioradialis 2+  2+  ?biceps 2+  2+  ?triceps 2+  2+  ?patellar 3+  3+  ?ankle jerk 2+  2+  ?Hoffman no  no  ?plantar response down  down  ? ?SENSORY:  Normal and symmetric perception of light touch, pinprick, vibration, and proprioception.   ? ?COORDINATION/GAIT: Normal  finger-to- nose-finger.  Gait is mildly wide-based, assisted with cane, but appears unsteady.   ? ? ?IMPRESSION: ?Lumbar canal stenosis with neurogenic claudication causing bilateral leg paresthesias and pain.  Prior CT lu

## 2021-11-24 ENCOUNTER — Ambulatory Visit: Payer: Medicare Other

## 2021-11-24 ENCOUNTER — Other Ambulatory Visit: Payer: Self-pay

## 2021-11-24 DIAGNOSIS — I480 Paroxysmal atrial fibrillation: Secondary | ICD-10-CM | POA: Diagnosis not present

## 2021-11-24 DIAGNOSIS — Z7901 Long term (current) use of anticoagulants: Secondary | ICD-10-CM

## 2021-11-24 LAB — POCT INR: INR: 1.9 — AB (ref 2.0–3.0)

## 2021-11-24 NOTE — Patient Instructions (Signed)
Description   ?Take 1.5 tablets today and tomorrow, then resume same dosage of Warfarin 1 tablet daily except 1.5 tablets on Tuesdays. Continue eating 2 serving of greens per week plus your one day of brussels sprouts. Recheck INR 4 weeks. Coumadin Clinic [phone] ?  ?  ?

## 2021-11-25 NOTE — Progress Notes (Signed)
? ? ?Patient ID: Tracey Todd, female    DOB: 03/13/1935  MRN: [id number] ? ?CC: Hypertension Follow-Up ? ?Subjective: ?Tracey Todd is a 87 y.o. female who presents for hypertension follow-up.  ? ?Her concerns today include:  ?HYPERTENSION FOLLOW-UP: ?07/30/2021: ?- Continue current regimen Amlodipine, Bisoprolol, and Furosemide as prescribed. ? ?11/30/2021: ?Doing well on current regimen. No side effects. No issues/concerns. Denies chest pain and shortness of breath.  ? ?2. DIABETES TYPE 2 FOLLOW-UP: ?09/14/2021: ?- Hemoglobin A1c today at goal at 8.0%, goal < 8%.  ?- Continue Metformin as prescribed. ? ?11/30/2021: ?Doing well on current regimen. No side effects. No issues/concerns.   ? ?3. HYPERLIPIDEMIA FOLLOW-UP: ?Doing well on current Atorvastatin, no issues/concerns.   ? ?Patient Active Problem List  ? Diagnosis Date Noted  ? Right ear impacted cerumen 11/06/2021  ? Chronic arthropathy 10/12/2019  ? Callus 10/12/2019  ? Long term (current) use of anticoagulants 10/02/2018  ? Atherosclerosis of aorta ([place]) 07/20/2017  ? Diabetic neuropathy ([place]) 07/20/2017  ? Moderate to severe pulmonary hypertension ([place]) 11/20/2015  ? BMI 34.0-34.9,adult 08/06/2015  ? Diabetes mellitus type 2, controlled ([place]) 04/25/2015  ? Sick sinus syndrome ([place]) 01/29/2015  ? Essential hypertension 10/22/2014  ? PAF-fib and flutter 10/22/2014  ? Congestive heart failure ([place]) 10/21/2014  ? 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 Obesity 07/08/2014  ? Vitamin D deficiency 09/28/2013  ? Medication management 09/28/2013  ? Hyperlipidemia   ? DJD (degenerative joint disease)   ? Fatty liver   ?  ? ?Current Outpatient Medications on File Prior to Visit  ?Medication Sig Dispense Refill  ? Blood Glucose Monitoring Suppl (ONETOUCH VERIO) w/Device KIT Use to check fasting blood sugar daily. Dx: E11.59 1 kit 0  ? calcium carbonate (OS-CAL) 600 MG TABS tablet Take 600 mg by mouth daily with breakfast.    ? cetirizine (ZYRTEC) 10 MG  tablet Take 10 mg by mouth every other day.     ? Cholecalciferol (VITAMIN D) 2000 UNITS CAPS Take 6,000 Units by mouth daily.     ? gabapentin (NEURONTIN) 300 MG capsule Take 1 capsule (300 mg total) by mouth at bedtime. 90 capsule 1  ? glucose blood (ONETOUCH VERIO) test strip Use to check fasting blood sugar daily. Dx: E11.59 100 each 2  ? Magnesium 500 MG TABS Take 500 mg by mouth 4 (four) times daily.     ? OneTouch Delica Lancets 06T MISC Use to check fasting blood sugar daily. Dx: E11.59 100 each 2  ? potassium chloride SA (KLOR-CON) 20 MEQ tablet Take 1 tablet (20 mEq total) by mouth daily. 90 tablet 0  ? warfarin (COUMADIN) 5 MG tablet AS DIRECTED BY  COUMADIN  CLINIC 40 tablet 2  ? ?No current facility-administered medications on file prior to visit.  ? ? ?Allergies  ?Allergen Reactions  ? Penicillins Anaphylaxis  ? Quinapril   ?  Angioedema ?  ? Feldene [Piroxicam] Other (See Comments)  ?  Bleeding   ? Calcium-Containing Compounds Other (See Comments)  ?  "calcium deposits on skin"  ? Celebrex [Celecoxib] Itching  ? Daypro [Oxaprozin] Itching  ? ? ?Social History  ? ?Socioeconomic History  ? Marital status: Married  ?  Spouse name: Not on file  ? Number of children: Not on file  ? Years of education: Not on file  ? Highest education level: Not on file  ?Occupational History  ? Not on file  ?Tobacco Use  ? Smoking status:  Former  ?  Types: Cigarettes  ?  Quit date: 12/06/1963  ?  Years since quitting: 58.0  ?  Passive exposure: Never  ? Smokeless tobacco: Never  ?Vaping Use  ? Vaping Use: Never used  ?Substance and Sexual Activity  ? Alcohol use: No  ? Drug use: Never  ? Sexual activity: Not Currently  ?Other Topics Concern  ? Not on file  ?Social History Narrative  ? Left Handed   ? Lives in a one story home   ? ?Social Determinants of Health  ? ?Financial Resource Strain: Low Risk   ? Difficulty of Paying Living Expenses: Not very hard  ?Food Insecurity: No Food Insecurity  ? Worried About Sales executive in the Last Year: Never true  ? Ran Out of Food in the Last Year: Never true  ?Transportation Needs: No Transportation Needs  ? Lack of Transportation (Medical): No  ? Lack of Transportation (Non-Medical): No  ?Physical Activity: Inactive  ? Days of Exercise per Week: 0 days  ? Minutes of Exercise per Session: 0 min  ?Stress: No Stress Concern Present  ? Feeling of Stress : Not at all  ?Social Connections: Moderately Integrated  ? Frequency of Communication with Friends and Family: More than three times a week  ? Frequency of Social Gatherings with Friends and Family: More than three times a week  ? Attends Religious Services: More than 4 times per year  ? Active Member of Clubs or Organizations: No  ? Attends Archivist Meetings: Never  ? Marital Status: Married  ?Intimate Partner Violence: Not At Risk  ? Fear of Current or Ex-Partner: No  ? Emotionally Abused: No  ? Physically Abused: No  ? Sexually Abused: No  ? ? ?Family History  ?Problem Relation Age of Onset  ? Diabetes Mother   ? Cancer Mother   ? Cancer Father   ? ? ?Past Surgical History:  ?Procedure Laterality Date  ? BREAST SURGERY    ? CATARACT EXTRACTION    ? LEFT AND RIGHT HEART CATHETERIZATION WITH CORONARY ANGIOGRAM N/A 10/23/2014  ? Procedure: LEFT AND RIGHT HEART CATHETERIZATION WITH CORONARY ANGIOGRAM;  Surgeon: Blane Ohara, MD;  [facility] C[facility] LAB;  Service: Cardiovascular;  Laterality: N/A;  ? PERMANENT PACEMAKER INSERTION N/A 10/25/2014  ? STJ dual chamber PPM implanted by Dr Rayann Heman for SSS  ? TONSILLECTOMY AND ADENOIDECTOMY    ? TOTAL KNEE ARTHROPLASTY    ? ? ?ROS: ?Review of Systems ?Negative except as stated above ? ?PHYSICAL EXAM: ?BP 126/65 (BP Location: Left Arm, Patient Position: Sitting, Cuff Size: Large)   Pulse 64   Temp 98.3 ?F (36.8 ?C)   Resp 16   Ht 4' 9.01" (1.448 m)   Wt 198 lb (89.8 kg)   SpO2 92%   BMI 42.83 kg/m?  ? ?Physical Exam ?HENT:  ?   Head: Normocephalic and atraumatic.  ?Eyes:  ?    Extraocular Movements: Extraocular movements intact.  ?   Conjunctiva/sclera: Conjunctivae normal.  ?   Pupils: Pupils are equal, round, and reactive to light.  ?Cardiovascular:  ?   Rate and Rhythm: Normal rate and regular rhythm.  ?   Pulses: Normal pulses.  ?   Heart sounds: Normal heart sounds.  ?Pulmonary:  ?   Effort: Pulmonary effort is normal.  ?   Breath sounds: Normal breath sounds.  ?Musculoskeletal:  ?   Cervical back: Normal range of motion and neck supple.  ?Neurological:  ?  General: No focal deficit present.  ?   Mental Status: She is alert and oriented to person, place, and time.  ?Psychiatric:     ?   Mood and Affect: Mood normal.     ?   Behavior: Behavior normal.  ? ? ?Results for orders placed or performed in visit on 11/30/21  ?POCT glycosylated hemoglobin (Hb A1C)  ?Result Value Ref Range  ? Hemoglobin A1C 7.9 (A) 4.0 - 5.6 %  ? HbA1c POC (<> result, manual entry)    ? HbA1c, POC (prediabetic range)    ? HbA1c, POC (controlled diabetic range)    ? ? ?ASSESSMENT AND PLAN: ?1. Essential (primary) hypertension: ?- Continue Amlodipine, Bisoprolol, and Furosemide as prescribed.  ?- Counseled on blood pressure goal of less than 140/90, low-sodium, DASH diet, medication compliance, 150 minutes of moderate intensity exercise per week as tolerated. Discussed medication compliance, adverse effects. ?- Follow-up with primary provider in 3 months or sooner if needed.  ?- amLODipine (NORVASC) 5 MG tablet; Take 1 tablet (5 mg total) by mouth daily.  Dispense: 90 tablet; Refill: 0 ?- bisoprolol (ZEBETA) 10 MG tablet; Take 1 tablet (10 mg total) by mouth daily. for blood pressure  Dispense: 90 tablet; Refill: 0 ?- furosemide (LASIX) 40 MG tablet; Take 1 tablet (40 mg total) by mouth daily.  Dispense: 90 tablet; Refill: 0 ? ?2. Type 2 diabetes mellitus with diabetic mononeuropathy, without long-term current use of insulin ([place]): ?- Hemoglobin A1c today at goal at 7.9%, goal < 8%.  ?- Continue Metformin as  prescribed.  ?- Discussed the importance of healthy eating habits, low-carbohydrate diet, low-sugar diet, regular aerobic exercise (at least 150 minutes a week as tolerated) and medication compliance to achieve or maintai

## 2021-11-26 ENCOUNTER — Ambulatory Visit (INDEPENDENT_AMBULATORY_CARE_PROVIDER_SITE_OTHER): Payer: Medicare Other

## 2021-11-26 DIAGNOSIS — I495 Sick sinus syndrome: Secondary | ICD-10-CM

## 2021-11-26 LAB — CUP PACEART REMOTE DEVICE CHECK
Battery Remaining Longevity: 35 mo
Battery Remaining Percentage: 31 %
Battery Voltage: 2.95 V
Brady Statistic AP VP Percent: 99 %
Brady Statistic AP VS Percent: 1 %
Brady Statistic AS VP Percent: 1 %
Brady Statistic AS VS Percent: 1 %
Brady Statistic RA Percent Paced: 99 %
Brady Statistic RV Percent Paced: 99 %
Date Time Interrogation Session: 20230330020015
Implantable Lead Implant Date: 20160226
Implantable Lead Implant Date: 20160226
Implantable Lead Location: 753859
Implantable Lead Location: 753860
Implantable Lead Model: 1948
Implantable Pulse Generator Implant Date: 20160226
Lead Channel Impedance Value: 440 Ohm
Lead Channel Impedance Value: 560 Ohm
Lead Channel Pacing Threshold Amplitude: 0.625 V
Lead Channel Pacing Threshold Amplitude: 0.75 V
Lead Channel Pacing Threshold Pulse Width: 0.4 ms
Lead Channel Pacing Threshold Pulse Width: 0.4 ms
Lead Channel Sensing Intrinsic Amplitude: 11 mV
Lead Channel Sensing Intrinsic Amplitude: 3.8 mV
Lead Channel Setting Pacing Amplitude: 1.625
Lead Channel Setting Pacing Amplitude: 2.5 V
Lead Channel Setting Pacing Pulse Width: 0.4 ms
Lead Channel Setting Sensing Sensitivity: 2 mV
Pulse Gen Model: 2240
Pulse Gen Serial Number: 7700661

## 2021-11-30 ENCOUNTER — Encounter: Payer: Self-pay | Admitting: Family

## 2021-11-30 ENCOUNTER — Ambulatory Visit (INDEPENDENT_AMBULATORY_CARE_PROVIDER_SITE_OTHER): Payer: Medicare Other | Admitting: Family

## 2021-11-30 VITALS — BP 126/65 | HR 64 | Temp 98.3°F | Resp 16 | Ht <= 58 in | Wt 198.0 lb

## 2021-11-30 DIAGNOSIS — Z23 Encounter for immunization: Secondary | ICD-10-CM

## 2021-11-30 DIAGNOSIS — I10 Essential (primary) hypertension: Secondary | ICD-10-CM

## 2021-11-30 DIAGNOSIS — E782 Mixed hyperlipidemia: Secondary | ICD-10-CM | POA: Diagnosis not present

## 2021-11-30 DIAGNOSIS — E1141 Type 2 diabetes mellitus with diabetic mononeuropathy: Secondary | ICD-10-CM | POA: Diagnosis not present

## 2021-11-30 LAB — POCT GLYCOSYLATED HEMOGLOBIN (HGB A1C): Hemoglobin A1C: 7.9 % — AB (ref 4.0–5.6)

## 2021-11-30 MED ORDER — BISOPROLOL FUMARATE 10 MG PO TABS
10.0000 mg | ORAL_TABLET | Freq: Every day | ORAL | 0 refills | Status: DC
Start: 2021-11-30 — End: 2022-02-12

## 2021-11-30 MED ORDER — ATORVASTATIN CALCIUM 40 MG PO TABS: 40.0000 mg | ORAL_TABLET | Freq: Every day | ORAL | 1 refills | Status: DC

## 2021-11-30 MED ORDER — FUROSEMIDE 40 MG PO TABS: 40.0000 mg | ORAL_TABLET | Freq: Every day | ORAL | 0 refills | Status: DC

## 2021-11-30 MED ORDER — AMLODIPINE BESYLATE 5 MG PO TABS: 5.0000 mg | ORAL_TABLET | Freq: Every day | ORAL | 0 refills | Status: DC

## 2021-11-30 MED ORDER — METFORMIN HCL ER 500 MG PO TB24: 500.0000 mg | ORAL_TABLET | Freq: Two times a day (BID) | ORAL | 0 refills | Status: DC

## 2021-11-30 NOTE — Progress Notes (Signed)
Pt presents for hypertension and diabetes follow-up  ?Marland KitchenA1c 8.0% 09/14/21 down to 7.9% today  ?

## 2021-11-30 NOTE — Progress Notes (Signed)
Diabetes discussed in office.

## 2021-12-01 ENCOUNTER — Telehealth: Payer: Self-pay | Admitting: Family

## 2021-12-01 LAB — LIPID PANEL
Chol/HDL Ratio: 3.3 ratio (ref 0.0–4.4)
Cholesterol, Total: 194 mg/dL (ref 100–199)
HDL: 59 mg/dL (ref >39)
LDL Chol Calc (NIH): 113 mg/dL — ABNORMAL HIGH (ref 0–99)
Triglycerides: 122 mg/dL (ref 0–149)
VLDL Cholesterol Cal: 22 mg/dL (ref 5–40)

## 2021-12-01 LAB — MICROALBUMIN / CREATININE URINE RATIO
Creatinine, Urine: 74.1 mg/dL
Microalb/Creat Ratio: 168 mg/g creat — ABNORMAL HIGH (ref 0–29)
Microalbumin, Urine: 124.6 ug/mL

## 2021-12-01 NOTE — Progress Notes (Signed)
Call patient with update.  ? ?Continue Atorvastatin for cholesterol maintenance.

## 2021-12-01 NOTE — Telephone Encounter (Signed)
Pt contacted.

## 2021-12-01 NOTE — Telephone Encounter (Signed)
Pt called in about lab results, haven't been released to pec yet. ?

## 2021-12-01 NOTE — Progress Notes (Signed)
Call patient with update.  ? ?You have protein spilling over into the urine. This could mean that diabetes is affecting the kidneys. Good diabetes control will help. Will continue to routinely check.

## 2021-12-08 NOTE — Progress Notes (Signed)
Remote pacemaker transmission.   

## 2021-12-17 ENCOUNTER — Ambulatory Visit
Admission: RE | Admit: 2021-12-17 | Discharge: 2021-12-17 | Disposition: A | Discharge status: Home / Self Care | Payer: Medicare Other | Source: Ambulatory Visit | Attending: Neurology | Admitting: Neurology

## 2021-12-17 DIAGNOSIS — M47817 Spondylosis without myelopathy or radiculopathy, lumbosacral region: Secondary | ICD-10-CM | POA: Diagnosis not present

## 2021-12-17 DIAGNOSIS — M5126 Other intervertebral disc displacement, lumbar region: Secondary | ICD-10-CM | POA: Diagnosis not present

## 2021-12-17 DIAGNOSIS — M48062 Spinal stenosis, lumbar region with neurogenic claudication: Secondary | ICD-10-CM

## 2021-12-17 DIAGNOSIS — M48061 Spinal stenosis, lumbar region without neurogenic claudication: Secondary | ICD-10-CM | POA: Diagnosis not present

## 2021-12-21 ENCOUNTER — Telehealth: Payer: Self-pay | Admitting: Neurology

## 2021-12-21 NOTE — Telephone Encounter (Signed)
Patient said she missed a call, she thinks its about her results but she is not sure ?

## 2021-12-21 NOTE — Telephone Encounter (Signed)
See Results. Called patient back and line was picked up and disconnected.  ?

## 2021-12-22 ENCOUNTER — Ambulatory Visit: Payer: Medicare Other | Admitting: Pharmacist

## 2021-12-22 DIAGNOSIS — I480 Paroxysmal atrial fibrillation: Secondary | ICD-10-CM

## 2021-12-22 DIAGNOSIS — Z7901 Long term (current) use of anticoagulants: Secondary | ICD-10-CM | POA: Diagnosis not present

## 2021-12-22 LAB — POCT INR: INR: 1.4 — AB (ref 2.0–3.0)

## 2021-12-22 NOTE — Patient Instructions (Signed)
Description   ?Take 2 tablets today and tomorrow, then start taking Warfarin 1 tablet daily except 1.5 tablets on Tuesdays and Fridays. Continue eating 2 serving of greens per week plus your one day of brussels sprouts. Recheck INR in 2 weeks. Coumadin Clinic [phone] ?  ? ? ?

## 2021-12-23 ENCOUNTER — Telehealth: Payer: Self-pay | Admitting: Neurology

## 2021-12-23 NOTE — Telephone Encounter (Signed)
Patient is calling someone back about results. She said she is still hurting ?

## 2021-12-23 NOTE — Telephone Encounter (Signed)
Called patient and voiced understanding of CT results.  ?

## 2022-01-05 ENCOUNTER — Ambulatory Visit: Payer: Medicare Other | Admitting: *Deleted

## 2022-01-05 ENCOUNTER — Encounter (INDEPENDENT_AMBULATORY_CARE_PROVIDER_SITE_OTHER): Payer: Self-pay

## 2022-01-05 DIAGNOSIS — I480 Paroxysmal atrial fibrillation: Secondary | ICD-10-CM | POA: Diagnosis not present

## 2022-01-05 DIAGNOSIS — Z7901 Long term (current) use of anticoagulants: Secondary | ICD-10-CM

## 2022-01-05 LAB — POCT INR: INR: 1.5 — AB (ref 2.0–3.0)

## 2022-01-05 NOTE — Patient Instructions (Signed)
Description   ?Take 2 tablets today and 2 tablets tomorrow, then start taking Warfarin 1.5 tablets daily except 1 tablet on Monday, Wednesday, and Friday. Continue eating 2 serving of greens per week plus your one day of brussels sprouts. Recheck INR in 2 weeks. Coumadin Clinic [phone] ?  ?  ?

## 2022-01-08 ENCOUNTER — Other Ambulatory Visit: Payer: Self-pay | Admitting: Internal Medicine

## 2022-01-08 DIAGNOSIS — Z7901 Long term (current) use of anticoagulants: Secondary | ICD-10-CM

## 2022-01-08 DIAGNOSIS — I480 Paroxysmal atrial fibrillation: Secondary | ICD-10-CM

## 2022-01-12 ENCOUNTER — Other Ambulatory Visit: Payer: Self-pay | Admitting: Family

## 2022-01-12 DIAGNOSIS — H40023 Open angle with borderline findings, high risk, bilateral: Secondary | ICD-10-CM | POA: Diagnosis not present

## 2022-01-12 DIAGNOSIS — E876 Hypokalemia: Secondary | ICD-10-CM

## 2022-01-12 DIAGNOSIS — H04123 Dry eye syndrome of bilateral lacrimal glands: Secondary | ICD-10-CM | POA: Diagnosis not present

## 2022-01-19 ENCOUNTER — Ambulatory Visit: Payer: Medicare Other | Admitting: *Deleted

## 2022-01-19 DIAGNOSIS — I480 Paroxysmal atrial fibrillation: Secondary | ICD-10-CM | POA: Diagnosis not present

## 2022-01-19 DIAGNOSIS — Z7901 Long term (current) use of anticoagulants: Secondary | ICD-10-CM

## 2022-01-19 LAB — POCT INR: INR: 1.8 — AB (ref 2.0–3.0)

## 2022-01-19 NOTE — Patient Instructions (Signed)
Description   Take 2 tablets today then start taking Warfarin 1.5 tablets daily except 1 tablet on Monday and Friday. Continue eating 2 serving of greens per week plus your one day of brussels sprouts. Recheck INR in 2 weeks. Coumadin Clinic [phone]

## 2022-01-27 NOTE — Progress Notes (Signed)
Patient ID: Tracey Todd, female    DOB: 10-Dec-1934  MRN: [id number]  CC: Left Hip Pain  Subjective: Tracey Todd is a 87 y.o. female who presents for left hip pain. She is accompanied by her daughter, Tracey Todd.   Her concerns today include:  Reports left hip pain began a couple months ago after trying to physically help move her husband up in bed. Reports she thought she could do it alone because he has lost a lot of weight and he seemed light enough to try. Denies red flag symptoms such as but not limited to recent falls/injury/trauma and abnormal lower extremity swelling from her baseline including warmth/tenderness/redness.   She is followed by Neurology initially thought low back pain was radiating and causing left hip pain/sciatic nerve pain. She was prescribed Gabapentin which helped some but did not give as much relief as she would like. Reports her neurologist is out on maternity leave. Plans to call Neurology soon to see who the covering provider is to ask if Gabapentin can be increased. Reports her neurologist told her that Gabapentin could be increased prior to her going on maternity leave.  She is also using over-the-counter Acetaminophen.  Reports pain persisting and worsening usually 7/10. Using a cane to assist with ambulation. She is requesting an xray today in office. She is open to referral to Orthopedic Surgery but states she does not want to have any surgeries.   Patient Active Problem List   Diagnosis Date Noted   Right ear impacted cerumen 11/06/2021   Chronic arthropathy 10/12/2019   Callus 10/12/2019   Long term (current) use of anticoagulants 10/02/2018   Atherosclerosis of aorta ([place]) 07/20/2017   Diabetic neuropathy ([place]) 07/20/2017   Moderate to severe pulmonary hypertension ([place]) 11/20/2015   BMI 34.0-34.9,adult 08/06/2015   Diabetes mellitus type 2, controlled ([place]) 04/25/2015   Sick sinus syndrome ([place]) 01/29/2015   Essential hypertension 10/22/2014    PAF-fib and flutter 10/22/2014   Congestive heart failure ([place]) 10/21/2014   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 90 tablet 0   atorvastatin (LIPITOR) 40 MG tablet Take 1 tablet (40 mg total) by mouth daily. 90 tablet 1   bisoprolol (ZEBETA) 10 MG tablet Take 1 tablet (10 mg total) by mouth daily. for blood pressure 90 tablet 0   Blood Glucose Monitoring Suppl (ONETOUCH VERIO) w/Device KIT Use to check fasting blood sugar daily. Dx: E11.59 1 kit 0   calcium carbonate (OS-CAL) 600 MG TABS tablet Take 600 mg by mouth daily with breakfast.     cetirizine (ZYRTEC) 10 MG tablet Take 10 mg by mouth every other day.      Cholecalciferol (VITAMIN D) 2000 UNITS CAPS Take 6,000 Units by mouth daily.      furosemide (LASIX) 40 MG tablet Take 1 tablet (40 mg total) by mouth daily. 90 tablet 0   gabapentin (NEURONTIN) 300 MG capsule Take 1 capsule (300 mg total) by mouth at bedtime. 90 capsule 1   glucose blood (ONETOUCH VERIO) test strip Use to check fasting blood sugar daily. Dx: E11.59 100 each 2  Magnesium 500 MG TABS Take 500 mg by mouth 4 (four) times daily.      metFORMIN (GLUCOPHAGE-XR) 500 MG 24 hr tablet Take 1 tablet (500 mg total) by mouth 2 (two) times daily with a meal. 180 tablet 0   OneTouch Delica Lancets 40J MISC Use to check fasting blood sugar daily. Dx: E11.59 100 each 2   potassium chloride SA (KLOR-CON M) 20 MEQ tablet Take 1 tablet by mouth once daily 90 tablet 0   warfarin (COUMADIN) 5 MG tablet TAKE 1 to 1 and 1/2 tablets daily or as DIRECTED BY ANTICOAGULATION  CLINIC 40 tablet 5   No current facility-administered medications on file  prior to visit.    Allergies  Allergen Reactions   Penicillins Anaphylaxis   Quinapril     Angioedema    Feldene [Piroxicam] Other (See Comments)    Bleeding    Calcium-Containing Compounds Other (See Comments)    "calcium deposits on skin"   Celebrex [Celecoxib] Itching   Daypro [Oxaprozin] Itching    Social History   Socioeconomic History   Marital status: Married    Spouse name: Not on file   Number of children: Not on file   Years of education: Not on file   Highest education level: Not on file  Occupational History   Not on file  Tobacco Use   Smoking status: Former    Types: Cigarettes    Quit date: 12/06/1963    Years since quitting: 58.1    Passive exposure: Never   Smokeless tobacco: Never  Vaping Use   Vaping Use: Never used  Substance and Sexual Activity   Alcohol use: No   Drug use: Never   Sexual activity: Not Currently  Other Topics Concern   Not on file  Social History Narrative   Left Handed    Lives in a one story home    Social Determinants of Health   Financial Resource Strain: Low Risk    Difficulty of Paying Living Expenses: Not very hard  Food Insecurity: No Food Insecurity   Worried About Charity fundraiser in the Last Year: Never true   [place] in the Last Year: Never true  Transportation Needs: No Transportation Needs   Lack of Transportation (Medical): No   Lack of Transportation (Non-Medical): No  Physical Activity: Inactive   Days of Exercise per Week: 0 days   Minutes of Exercise per Session: 0 min  Stress: No Stress Concern Present   Feeling of Stress : Not at all  Social Connections: Moderately Integrated   Frequency of Communication with Friends and Family: More than three times a week   Frequency of Social Gatherings with Friends and Family: More than three times a week   Attends Religious Services: More than 4 times per year   Active Member of [employer] or Organizations: No   Attends Music therapist:  Never   Marital Status: Married  Human resources officer Violence: Not At Risk   Fear of Current or Ex-Partner: No   Emotionally Abused: No   Physically Abused: No   Sexually Abused: No    Family History  Problem Relation Age of Onset   Diabetes Mother    Cancer Mother    Cancer Father     Past Surgical History:  Procedure Laterality Date   BREAST SURGERY     CATARACT EXTRACTION     LEFT AND RIGHT HEART CATHETERIZATION WITH CORONARY ANGIOGRAM N/A 10/23/2014   Procedure: LEFT  AND RIGHT HEART CATHETERIZATION WITH CORONARY ANGIOGRAM;  Surgeon: Blane Ohara, MD;  [facility] C[facility] LAB;  Service: Cardiovascular;  Laterality: N/A;   PERMANENT PACEMAKER INSERTION N/A 10/25/2014   STJ dual chamber PPM implanted by Dr Rayann Heman for SSS   TONSILLECTOMY AND ADENOIDECTOMY     TOTAL KNEE ARTHROPLASTY      ROS: Review of Systems Negative except as stated above  PHYSICAL EXAM: BP (!) 145/69 (BP Location: Right Arm, Patient Position: Sitting, Cuff Size: Large)   Pulse 61   Temp 98.3 F (36.8 C)   Resp 18   Ht 4' 9.01" (1.448 m)   Wt 195 lb (88.5 kg)   SpO2 94%   BMI 42.19 kg/m   Physical Exam HENT:     Head: Normocephalic and atraumatic.  Eyes:     Extraocular Movements: Extraocular movements intact.     Conjunctiva/sclera: Conjunctivae normal.     Pupils: Pupils are equal, round, and reactive to light.  Cardiovascular:     Rate and Rhythm: Normal rate and regular rhythm.     Pulses: Normal pulses.     Heart sounds: Normal heart sounds.  Pulmonary:     Effort: Pulmonary effort is normal.     Breath sounds: Normal breath sounds.  Musculoskeletal:     Cervical back: Normal range of motion and neck supple.  Neurological:     General: No focal deficit present.     Mental Status: She is alert and oriented to person, place, and time.     Gait: Gait is intact.     Comments: Gait slow and steady with cane.  Psychiatric:        Mood and Affect: Mood normal.        Behavior: Behavior  normal.    ASSESSMENT AND PLAN: 1. Chronic left hip pain: - Diagnostic xray left hip for further evaluation.  - Referral to Orthopedic Surgery for further evaluation and management. - Ambulatory referral to Orthopedic Surgery - DG Hip Unilat W OR W/O Pelvis 2-3 Views Left; Future   Patient was given the opportunity to ask questions.  Patient verbalized understanding of the plan and was able to repeat key elements of the plan. Patient was given clear instructions to go to Emergency Department or return to medical center if symptoms don't improve, worsen, or new problems develop.The patient verbalized understanding.   Orders Placed This Encounter  Procedures   DG Hip Unilat W OR W/O Pelvis 2-3 Views Left   Ambulatory referral to Orthopedic Surgery     Requested Prescriptions    No prescriptions requested or ordered in this encounter    Follow-up with primary provider as scheduled.  Camillia Herter, NP

## 2022-02-01 ENCOUNTER — Ambulatory Visit (INDEPENDENT_AMBULATORY_CARE_PROVIDER_SITE_OTHER): Payer: Medicare Other | Admitting: Family

## 2022-02-01 ENCOUNTER — Ambulatory Visit (INDEPENDENT_AMBULATORY_CARE_PROVIDER_SITE_OTHER): Payer: Medicare Other

## 2022-02-01 ENCOUNTER — Encounter: Payer: Self-pay | Admitting: Family

## 2022-02-01 VITALS — BP 145/69 | HR 61 | Temp 98.3°F | Resp 18 | Ht <= 58 in | Wt 195.0 lb

## 2022-02-01 DIAGNOSIS — G8929 Other chronic pain: Secondary | ICD-10-CM

## 2022-02-01 DIAGNOSIS — M25552 Pain in left hip: Secondary | ICD-10-CM

## 2022-02-01 NOTE — Progress Notes (Signed)
Pt presents for left hip pain wants xray, states when walking pain level 7, denies any falls

## 2022-02-02 NOTE — Progress Notes (Signed)
Call patient with update.   Bilateral hip arthritis. Left hip has more arthritis than right hip. Continue with plan discussed in office.

## 2022-02-04 ENCOUNTER — Telehealth: Payer: Self-pay | Admitting: Neurology

## 2022-02-04 NOTE — Telephone Encounter (Signed)
Pt called in stating she is wanting to see if she might could increase her gabapentin? Her pain is not quite controlled.

## 2022-02-05 ENCOUNTER — Other Ambulatory Visit: Payer: Self-pay

## 2022-02-05 DIAGNOSIS — E1142 Type 2 diabetes mellitus with diabetic polyneuropathy: Secondary | ICD-10-CM

## 2022-02-05 DIAGNOSIS — M48062 Spinal stenosis, lumbar region with neurogenic claudication: Secondary | ICD-10-CM

## 2022-02-05 MED ORDER — GABAPENTIN 300 MG PO CAPS: 300.0000 mg | ORAL_CAPSULE | Freq: Two times a day (BID) | ORAL | 1 refills | Status: DC

## 2022-02-05 NOTE — Telephone Encounter (Signed)
Called patient and let her know Dr. Allena Katz is out of the office but her colleague Dr. Arbutus Leas recommended the patient try BID for the Gabapentin but if she has any falls or confusion to please let us know. Prescription has been sent in.

## 2022-02-08 ENCOUNTER — Ambulatory Visit: Payer: Medicare Other | Admitting: Physician Assistant

## 2022-02-09 ENCOUNTER — Ambulatory Visit: Payer: Medicare Other | Admitting: *Deleted

## 2022-02-09 DIAGNOSIS — Z7901 Long term (current) use of anticoagulants: Secondary | ICD-10-CM

## 2022-02-09 DIAGNOSIS — I480 Paroxysmal atrial fibrillation: Secondary | ICD-10-CM

## 2022-02-09 LAB — POCT INR: INR: 2 (ref 2.0–3.0)

## 2022-02-09 NOTE — Patient Instructions (Signed)
Description   Take 2 tablets today then continue taking Warfarin 1.5 tablets daily except 1 tablet on Monday and Friday. Continue eating 2 serving of greens per week plus your one day of brussels sprouts. Recheck INR in 4 weeks. [facility] [phone]

## 2022-02-12 ENCOUNTER — Other Ambulatory Visit: Payer: Self-pay | Admitting: Family

## 2022-02-12 DIAGNOSIS — I10 Essential (primary) hypertension: Secondary | ICD-10-CM

## 2022-02-12 NOTE — Telephone Encounter (Signed)
Requested Prescriptions  Pending Prescriptions Disp Refills  . furosemide (LASIX) 40 MG tablet [Pharmacy Med Name: Furosemide 40 MG Oral Tablet] 90 tablet 0    Sig: Take 1 tablet by mouth once daily     Cardiovascular:  Diuretics - Loop Failed - 02/12/2022 12:19 PM      Failed - Mg Level in normal range and within 180 days    Magnesium  Date Value Ref Range Status  08/21/2018 2.0 1.6 - 2.3 mg/dL Final         Failed - Last BP in normal range    BP Readings from Last 1 Encounters:  02/01/22 (!) 145/69         Passed - K in normal range and within 180 days    Potassium  Date Value Ref Range Status  09/14/2021 4.7 3.5 - 5.2 mmol/L Final         Passed - Ca in normal range and within 180 days    Calcium  Date Value Ref Range Status  09/14/2021 9.7 8.7 - 10.3 mg/dL Final   Calcium, Ion  Date Value Ref Range Status  12/07/2009 1.08 (L) 1.12 - 1.32 mmol/L Final         Passed - Na in normal range and within 180 days    Sodium  Date Value Ref Range Status  09/14/2021 141 134 - 144 mmol/L Final         Passed - Cr in normal range and within 180 days    Creat  Date Value Ref Range Status  06/02/2018 0.63 0.60 - 0.88 mg/dL Final    Comment:    For patients >21 years of age, the reference limit for Creatinine is approximately 13% higher for people identified as African-American. .    Creatinine, Ser  Date Value Ref Range Status  09/14/2021 0.74 0.57 - 1.00 mg/dL Final   Creatinine, Urine  Date Value Ref Range Status  06/02/2018 45 20 - 275 mg/dL Final         Passed - Cl in normal range and within 180 days    Chloride  Date Value Ref Range Status  09/14/2021 101 96 - 106 mmol/L Final         Passed - Valid encounter within last 6 months    Recent Outpatient Visits          1 week ago Chronic left hip pain   Primary Care at [facility], Amy J, NP   2 months ago Essential (primary) hypertension   Primary Care at [facility], Amy J, NP    3 months ago Right ear impacted cerumen   Primary Care at [facility], Gildardo Pounds, NP   4 months ago COVID-19   Primary Care at [facility], Gildardo Pounds, NP   5 months ago Type 2 diabetes mellitus with diabetic mononeuropathy, without long-term current use of insulin [facility])   Primary Care at [facility], Salomon Fick, NP      Future Appointments            In 2 weeks Rema Fendt, NP Primary Care at [facility]           . bisoprolol (ZEBETA) 10 MG tablet [Pharmacy Med Name: Bisoprolol Fumarate 10 MG Oral Tablet] 90 tablet 0    Sig: Take 1 tablet by mouth once daily for blood pressure     Cardiovascular: Beta Blockers 2 Failed - 02/12/2022 12:19 PM  Failed - Last BP in normal range    BP Readings from Last 1 Encounters:  02/01/22 (!) 145/69         Passed - Cr in normal range and within 360 days    Creat  Date Value Ref Range Status  06/02/2018 0.63 0.60 - 0.88 mg/dL Final    Comment:    For patients >76 years of age, the reference limit for Creatinine is approximately 13% higher for people identified as African-American. .    Creatinine, Ser  Date Value Ref Range Status  09/14/2021 0.74 0.57 - 1.00 mg/dL Final   Creatinine, Urine  Date Value Ref Range Status  06/02/2018 45 20 - 275 mg/dL Final         Passed - Last Heart Rate in normal range    Pulse Readings from Last 1 Encounters:  02/01/22 61         Passed - Valid encounter within last 6 months    Recent Outpatient Visits          1 week ago Chronic left hip pain   Primary Care at [facility], Amy J, NP   2 months ago Essential (primary) hypertension   Primary Care at [facility], Amy J, NP   3 months ago Right ear impacted cerumen   Primary Care at [facility], Gildardo Pounds, NP   4 months ago COVID-19   Primary Care at [facility], Gildardo Pounds, NP   5 months ago Type 2 diabetes mellitus with diabetic mononeuropathy, without  long-term current use of insulin [facility])   Primary Care at [facility], Salomon Fick, NP      Future Appointments            In 2 weeks Rema Fendt, NP Primary Care at [facility]

## 2022-02-19 ENCOUNTER — Ambulatory Visit (INDEPENDENT_AMBULATORY_CARE_PROVIDER_SITE_OTHER): Payer: Medicare Other | Admitting: Podiatry

## 2022-02-19 ENCOUNTER — Encounter: Payer: Self-pay | Admitting: Podiatry

## 2022-02-19 DIAGNOSIS — M2010 Hallux valgus (acquired), unspecified foot: Secondary | ICD-10-CM | POA: Diagnosis not present

## 2022-02-19 DIAGNOSIS — B351 Tinea unguium: Secondary | ICD-10-CM | POA: Diagnosis not present

## 2022-02-19 DIAGNOSIS — D689 Coagulation defect, unspecified: Secondary | ICD-10-CM

## 2022-02-19 DIAGNOSIS — E1142 Type 2 diabetes mellitus with diabetic polyneuropathy: Secondary | ICD-10-CM

## 2022-02-19 DIAGNOSIS — M79676 Pain in unspecified toe(s): Secondary | ICD-10-CM

## 2022-02-19 DIAGNOSIS — E1121 Type 2 diabetes mellitus with diabetic nephropathy: Secondary | ICD-10-CM

## 2022-02-22 ENCOUNTER — Other Ambulatory Visit: Payer: Self-pay | Admitting: Family

## 2022-02-22 DIAGNOSIS — E1159 Type 2 diabetes mellitus with other circulatory complications: Secondary | ICD-10-CM

## 2022-02-22 NOTE — Progress Notes (Signed)
Patient ID: Tracey Todd, female    DOB: [birth year]  MRN: [id number]  CC: Chronic Care Management   Subjective: Tracey Todd is a 87 y.o. female who presents for chronic care management.   Her concerns today include:  Doing well on current regimen. No side effects. No issues/concerns. Home blood sugars 130's -140's most days. Concerned about chronic bilateral leg edema. Causing ankle swelling and difficulty putting on shoes some days. Doesn't feel Lasix is helping. Denies red flag symptoms such as but not limited to leg pain/tenderness/warmth.    Patient Active Problem List   Diagnosis Date Noted   Hav (hallux abducto valgus), unspecified laterality 02/19/2022   Right ear impacted cerumen 11/06/2021   Chronic arthropathy 10/12/2019   Callus 10/12/2019   Long term (current) use of anticoagulants 10/02/2018   Atherosclerosis of aorta ([place]) 07/20/2017   Diabetic neuropathy ([place]) 07/20/2017   Moderate to severe pulmonary hypertension ([place]) 11/20/2015   BMI 34.0-34.9,adult 08/06/2015   Diabetes mellitus type 2, controlled ([place]) 04/25/2015   Sick sinus syndrome ([place]) 01/29/2015   Essential hypertension 10/22/2014   PAF-fib and flutter 10/22/2014   Congestive heart failure ([place]) 10/21/2014   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 90 tablet 1   bisoprolol (ZEBETA) 10 MG tablet Take 1 tablet by mouth once daily for blood pressure 90 tablet 0   Blood Glucose Monitoring Suppl (ONETOUCH VERIO) w/Device KIT Use to check fasting blood sugar daily. Dx: E11.59 1 kit 0   calcium carbonate (OS-CAL) 600 MG TABS tablet Take 600 mg by mouth daily with  breakfast.     cetirizine (ZYRTEC) 10 MG tablet Take 10 mg by mouth every other day.      Cholecalciferol (VITAMIN D) 2000 UNITS CAPS Take 6,000 Units by mouth daily.      furosemide (LASIX) 40 MG tablet Take 1 tablet by mouth once daily 90 tablet 0   gabapentin (NEURONTIN) 300 MG capsule Take 1 capsule (300 mg total) by mouth 2 (two) times daily. 180 capsule 1   glucose blood (ONETOUCH VERIO) test strip Use to check fasting blood sugar daily. Dx: E11.59 100 each 2   Lancets (ONETOUCH DELICA PLUS [id number]) MISC USE TO CHECK FASTING BLOOD SUGAR DAILY 100 each 0   Magnesium 500 MG TABS Take 500 mg by mouth 4 (four) times daily.      warfarin (COUMADIN) 5 MG tablet TAKE 1 to 1 and 1/2 tablets daily or as DIRECTED BY ANTICOAGULATION  CLINIC 40 tablet 5   No current facility-administered medications on file prior to visit.    Allergies  Allergen Reactions   Penicillins Anaphylaxis   Quinapril     Angioedema    Feldene [Piroxicam] Other (See Comments)    Bleeding    Calcium-Containing Compounds Other (See Comments)    "calcium deposits on skin"   Celebrex [Celecoxib] Itching   Daypro [Oxaprozin] Itching    Social History   Socioeconomic History   Marital status: Married    Spouse name: Not on file   Number of children: Not on file   Years of education: Not on  file   Highest education level: Not on file  Occupational History   Not on file  Tobacco Use   Smoking status: Former    Types: Cigarettes    Quit date: 12/06/1963    Years since quitting: 58.2    Passive exposure: Never   Smokeless tobacco: Never  Vaping Use   Vaping Use: Never used  Substance and Sexual Activity   Alcohol use: No   Drug use: Never   Sexual activity: Not Currently  Other Topics Concern   Not on file  Social History Narrative   Left Handed    Lives in a one story home    Social Determinants of Health   Financial Resource Strain: Low Risk  (05/28/2021)   Overall Financial Resource Strain  (CARDIA)    Difficulty of Paying Living Expenses: Not very hard  Food Insecurity: No Food Insecurity (05/28/2021)   Hunger Vital Sign    Worried About Running Out of Food in the Last Year: Never true    Ran Out of Food in the Last Year: Never true  Transportation Needs: No Transportation Needs (05/28/2021)   PRAPARE - Hydrologist (Medical): No    Lack of Transportation (Non-Medical): No  Physical Activity: Inactive (05/28/2021)   Exercise Vital Sign    Days of Exercise per Week: 0 days    Minutes of Exercise per Session: 0 min  Stress: No Stress Concern Present (05/28/2021)   [place]    Feeling of Stress : Not at all  Social Connections: Moderately Integrated (05/28/2021)   Social Connection and Isolation Panel [NHANES]    Frequency of Communication with Friends and Family: More than three times a week    Frequency of Social Gatherings with Friends and Family: More than three times a week    Attends Religious Services: More than 4 times per year    Active Member of [employer] or Organizations: No    Attends Archivist Meetings: Never    Marital Status: Married  Human resources officer Violence: Not At Risk (05/28/2021)   Humiliation, Afraid, Rape, and Kick questionnaire    Fear of Current or Ex-Partner: No    Emotionally Abused: No    Physically Abused: No    Sexually Abused: No    Family History  Problem Relation Age of Onset   Diabetes Mother    Cancer Mother    Cancer Father     Past Surgical History:  Procedure Laterality Date   BREAST SURGERY     CATARACT EXTRACTION     LEFT AND RIGHT HEART CATHETERIZATION WITH CORONARY ANGIOGRAM N/A 10/23/2014   Procedure: LEFT AND RIGHT HEART CATHETERIZATION WITH CORONARY ANGIOGRAM;  Surgeon: Blane Ohara, MD;  Location: [facility] CATH LAB;  Service: Cardiovascular;  Laterality: N/A;   PERMANENT PACEMAKER INSERTION N/A 10/25/2014   STJ dual  chamber PPM implanted by Dr Rayann Heman for SSS   TONSILLECTOMY AND ADENOIDECTOMY     TOTAL KNEE ARTHROPLASTY      ROS: Review of Systems Negative except as stated above  PHYSICAL EXAM: BP 135/81 (BP Location: Left Arm, Patient Position: Sitting, Cuff Size: Large)   Pulse 70   Temp 98.3 F (36.8 C)   Resp 16   Ht 4' 9.01" (1.448 m)   Wt 193 lb (87.5 kg)   SpO2 92%   BMI 41.75 kg/m   Physical Exam HENT:     Head: Normocephalic and atraumatic.  Eyes:  Extraocular Movements: Extraocular movements intact.     Conjunctiva/sclera: Conjunctivae normal.     Pupils: Pupils are equal, round, and reactive to light.  Cardiovascular:     Rate and Rhythm: Normal rate and regular rhythm.     Pulses: Normal pulses.     Heart sounds: Normal heart sounds.  Pulmonary:     Effort: Pulmonary effort is normal.     Breath sounds: Normal breath sounds.  Musculoskeletal:     Cervical back: Normal range of motion and neck supple.     Right ankle: Swelling present.     Left ankle: Swelling present.  Neurological:     General: No focal deficit present.     Mental Status: She is alert and oriented to person, place, and time.  Psychiatric:        Mood and Affect: Mood normal.        Behavior: Behavior normal.    Results for orders placed or performed in visit on 03/01/22  POCT glycosylated hemoglobin (Hb A1C)  Result Value Ref Range   Hemoglobin A1C 8.3 (A) 4.0 - 5.6 %   HbA1c POC (<> result, manual entry)     HbA1c, POC (prediabetic range)     HbA1c, POC (controlled diabetic range)      ASSESSMENT AND PLAN: 1. Essential (primary) hypertension - Continue Bisoprolol as prescribed. No refills needed as of present.  - Continue Furosemide as prescribed. No refills needed as of present.  - Continue Amlodipine as prescribed.  - Counseled on blood pressure goal of less than 140/90, low-sodium, DASH diet, medication compliance, 150 minutes of moderate intensity exercise per week as tolerated.  Discussed medication compliance, adverse effects. - Update BMP.  - Follow-up with primary provider in 4 months or sooner if needed.  - Basic Metabolic Panel - amLODipine (NORVASC) 5 MG tablet; Take 1 tablet (5 mg total) by mouth daily.  Dispense: 30 tablet; Refill: 3  2. Type 2 diabetes mellitus with diabetic mononeuropathy, without long-term current use of insulin (HCC) - Hemoglobin A1c relative to goal 8.3%, goal 8%. - Continue Metformin as prescribed.  - Discussed the importance of healthy eating habits, low-carbohydrate diet, low-sugar diet, regular aerobic exercise (at least 150 minutes a week as tolerated) and medication compliance to achieve or maintain control of diabetes. - Follow-up with primary provider in 4 months or sooner if needed.  - POCT glycosylated hemoglobin (Hb A1C) - metFORMIN (GLUCOPHAGE-XR) 500 MG 24 hr tablet; Take 1 tablet (500 mg total) by mouth 2 (two) times daily with a meal.  Dispense: 60 tablet; Refill: 3  3. Hypokalemia - Continue Potassium Chloride as prescribed.  - Follow-up with primary provider as scheduled. - potassium chloride SA (KLOR-CON M) 20 MEQ tablet; Take 1 tablet (20 mEq total) by mouth daily.  Dispense: 30 tablet; Refill: 3  4. Bilateral leg edema - Continue Furosemide as prescribed.  - Referral to Vascular Surgery for further evaluation and management.  - Ambulatory referral to Vascular Surgery  Patient was given the opportunity to ask questions.  Patient verbalized understanding of the plan and was able to repeat key elements of the plan. Patient was given clear instructions to go to Emergency Department or return to medical center if symptoms don't improve, worsen, or new problems develop.The patient verbalized understanding.   Orders Placed This Encounter  Procedures   Basic Metabolic Panel   Ambulatory referral to Vascular Surgery   POCT glycosylated hemoglobin (Hb A1C)     Requested Prescriptions  Signed Prescriptions Disp  Refills   amLODipine (NORVASC) 5 MG tablet 30 tablet 3    Sig: Take 1 tablet (5 mg total) by mouth daily.   metFORMIN (GLUCOPHAGE-XR) 500 MG 24 hr tablet 60 tablet 3    Sig: Take 1 tablet (500 mg total) by mouth 2 (two) times daily with a meal.   potassium chloride SA (KLOR-CON M) 20 MEQ tablet 30 tablet 3    Sig: Take 1 tablet (20 mEq total) by mouth daily.    Return in about 4 months (around 07/02/2022) for Follow-Up or next available chronic care .  Camillia Herter, NP

## 2022-02-25 ENCOUNTER — Telehealth: Payer: Self-pay

## 2022-02-25 ENCOUNTER — Ambulatory Visit (INDEPENDENT_AMBULATORY_CARE_PROVIDER_SITE_OTHER): Payer: Medicare Other

## 2022-02-25 DIAGNOSIS — I495 Sick sinus syndrome: Secondary | ICD-10-CM | POA: Diagnosis not present

## 2022-02-25 LAB — CUP PACEART REMOTE DEVICE CHECK
Battery Remaining Longevity: 31 mo
Battery Remaining Percentage: 28 %
Battery Voltage: 2.95 V
Brady Statistic AP VP Percent: 99 %
Brady Statistic AP VS Percent: 1 %
Brady Statistic AS VP Percent: 1 %
Brady Statistic AS VS Percent: 1 %
Brady Statistic RA Percent Paced: 94 %
Brady Statistic RV Percent Paced: 99 %
Date Time Interrogation Session: 20230629020014
Implantable Lead Implant Date: 20160226
Implantable Lead Implant Date: 20160226
Implantable Lead Location: 753859
Implantable Lead Location: 753860
Implantable Lead Model: 1948
Implantable Pulse Generator Implant Date: 20160226
Lead Channel Impedance Value: 380 Ohm
Lead Channel Impedance Value: 530 Ohm
Lead Channel Pacing Threshold Amplitude: 0.625 V
Lead Channel Pacing Threshold Amplitude: 0.75 V
Lead Channel Pacing Threshold Pulse Width: 0.4 ms
Lead Channel Pacing Threshold Pulse Width: 0.4 ms
Lead Channel Sensing Intrinsic Amplitude: 2.4 mV
Lead Channel Sensing Intrinsic Amplitude: 8.2 mV
Lead Channel Setting Pacing Amplitude: 1.625
Lead Channel Setting Pacing Amplitude: 2.5 V
Lead Channel Setting Pacing Pulse Width: 0.4 ms
Lead Channel Setting Sensing Sensitivity: 2 mV
Pulse Gen Model: 2240
Pulse Gen Serial Number: 7700661

## 2022-02-25 NOTE — Telephone Encounter (Addendum)
I was able to connect with patient this afternoon to discuss her findings on remote transmission.  Patient states that she has noted some intermittent shortness of breath in the evening hours mainly over the past 3-4 weeks.  In addition, she states that when these episodes occur, she feels like she has to "cough" to clear her throat and take a deep breath.  Otherwise, she denies any chest pain, dizziness, or lightheadedness and episode eventually passes.  She does note ongoing swelling to B lower extremities but states that this is not new or worsening.  At the present moment, she denies any symptoms at all.    I explained to her what was going on with her transmission and she did confirm that she is taking her coumadin as prescribed.   Given that patient is having symptoms, I will send this note to afib clinic to follow up with patient as she states she has not been seen by them before. Patient is aware to expect a call from their clinic to establish.   Will also forward to Dr. [name].

## 2022-02-25 NOTE — Telephone Encounter (Signed)
Scheduled remote reviewed. Normal device function.   Continues to be in an ongoing AF since 02/09/2022, on [facility] according to previous reports, AF burden is 5.3% of the time.  Sent to triage Next remote 91 days.  Successful telephone call to patient to assess for signs and symptoms of persistent AF.  Patient states that its' not a good time to talk when I called her this am and requests a call back at a later time this am.  Will follow up with patient to further assess later this am.

## 2022-02-26 NOTE — Telephone Encounter (Signed)
Appt made

## 2022-03-01 ENCOUNTER — Ambulatory Visit: Payer: Medicare Other | Admitting: Family

## 2022-03-01 ENCOUNTER — Encounter: Payer: Self-pay | Admitting: Family

## 2022-03-01 VITALS — BP 135/81 | HR 70 | Temp 98.3°F | Resp 16 | Ht <= 58 in | Wt 193.0 lb

## 2022-03-01 DIAGNOSIS — E1141 Type 2 diabetes mellitus with diabetic mononeuropathy: Secondary | ICD-10-CM

## 2022-03-01 DIAGNOSIS — R600 Localized edema: Secondary | ICD-10-CM

## 2022-03-01 DIAGNOSIS — I10 Essential (primary) hypertension: Secondary | ICD-10-CM

## 2022-03-01 DIAGNOSIS — E876 Hypokalemia: Secondary | ICD-10-CM | POA: Diagnosis not present

## 2022-03-01 LAB — POCT GLYCOSYLATED HEMOGLOBIN (HGB A1C): Hemoglobin A1C: 8.3 % — AB (ref 4.0–5.6)

## 2022-03-01 MED ORDER — METFORMIN HCL ER 500 MG PO TB24: 500.0000 mg | ORAL_TABLET | Freq: Two times a day (BID) | ORAL | 3 refills | Status: DC

## 2022-03-01 MED ORDER — AMLODIPINE BESYLATE 5 MG PO TABS: 5.0000 mg | ORAL_TABLET | Freq: Every day | ORAL | 3 refills | Status: DC

## 2022-03-01 MED ORDER — POTASSIUM CHLORIDE CRYS ER 20 MEQ PO TBCR: 20.0000 meq | EXTENDED_RELEASE_TABLET | Freq: Every day | ORAL | 3 refills | Status: DC

## 2022-03-01 NOTE — Progress Notes (Signed)
Established Patient Office Visit  Subjective   Patient ID: Tracey Todd, female    DOB: [birth year]  Age: 87 y.o. MRN: [id number]  Chief Complaint  Patient presents with   Chronic Care Management    HPI  Pt presents for chronic care management, pt complains of bilateral leg edema that has been going on for approx 1 month, states legs are sore, states she does not feel like furosemide is working  Pt needs refill on Klor-con  ROS    Objective:     BP 135/81 (BP Location: Left Arm, Patient Position: Sitting, Cuff Size: Large)   Pulse 70   Temp 98.3 F (36.8 C)   Resp 16   Ht 4' 9.01" (1.448 m)   Wt 193 lb (87.5 kg)   SpO2 92%   BMI 41.75 kg/m    Physical Exam   No results found for any visits on 03/01/22.    The ASCVD Risk score (Arnett DK, et al., 2019) failed to calculate for the following reasons:   The 2019 ASCVD risk score is only valid for ages 63 to 82    Assessment & Plan:   Problem List Items Addressed This Visit   None Visit Diagnoses     Essential (primary) hypertension    -  Primary   Relevant Orders   Basic Metabolic Panel   Type 2 diabetes mellitus with diabetic mononeuropathy, without long-term current use of insulin (HCC)       Relevant Orders   POCT glycosylated hemoglobin (Hb A1C)       No follow-ups on file.    Margorie John, CMA

## 2022-03-02 LAB — BASIC METABOLIC PANEL
BUN/Creatinine Ratio: 24 (ref 12–28)
BUN: 20 mg/dL (ref 8–27)
CO2: 20 mmol/L (ref 20–29)
Calcium: 9.4 mg/dL (ref 8.7–10.3)
Chloride: 105 mmol/L (ref 96–106)
Creatinine, Ser: 0.84 mg/dL (ref 0.57–1.00)
Glucose: 159 mg/dL — ABNORMAL HIGH (ref 70–99)
Potassium: 4.7 mmol/L (ref 3.5–5.2)
Sodium: 143 mmol/L (ref 134–144)
eGFR: 68 mL/min/{1.73_m2} (ref >59)

## 2022-03-03 ENCOUNTER — Ambulatory Visit (INDEPENDENT_AMBULATORY_CARE_PROVIDER_SITE_OTHER): Payer: Medicare Other | Admitting: Podiatry

## 2022-03-03 DIAGNOSIS — E1121 Type 2 diabetes mellitus with diabetic nephropathy: Secondary | ICD-10-CM

## 2022-03-03 DIAGNOSIS — M2010 Hallux valgus (acquired), unspecified foot: Secondary | ICD-10-CM

## 2022-03-03 NOTE — Progress Notes (Addendum)
Patient presented for foam casting diabetic shoe inserts. Patient is a size 9 wide  Diabetic shoes are chosen from the safe step catalog and the shoes chosen are the first choice is 821 and the second choice is A 720  The patient will be contacted when the shoes and inserts are ready to be picked up.  Helane Gunther DPM

## 2022-03-09 ENCOUNTER — Ambulatory Visit: Payer: Medicare Other

## 2022-03-09 DIAGNOSIS — I480 Paroxysmal atrial fibrillation: Secondary | ICD-10-CM

## 2022-03-09 DIAGNOSIS — Z7901 Long term (current) use of anticoagulants: Secondary | ICD-10-CM

## 2022-03-09 LAB — POCT INR: INR: 2.2 (ref 2.0–3.0)

## 2022-03-09 NOTE — Patient Instructions (Signed)
Description   Continue taking Warfarin 1.5 tablets daily except 1 tablet on Mondays and Fridays. Continue eating 2 serving of greens per week plus your one day of brussels sprouts. Recheck INR in 4 weeks. Coumadin Clinic [phone]

## 2022-03-11 ENCOUNTER — Encounter (HOSPITAL_COMMUNITY): Payer: Self-pay | Admitting: Nurse Practitioner

## 2022-03-11 ENCOUNTER — Ambulatory Visit (HOSPITAL_COMMUNITY)
Admission: RE | Admit: 2022-03-11 | Discharge: 2022-03-11 | Disposition: A | Discharge status: Home / Self Care | Payer: Medicare Other | Source: Ambulatory Visit | Attending: Nurse Practitioner | Admitting: Nurse Practitioner

## 2022-03-11 VITALS — BP 106/60 | HR 70 | Ht <= 58 in | Wt 204.8 lb

## 2022-03-11 DIAGNOSIS — Z7901 Long term (current) use of anticoagulants: Secondary | ICD-10-CM | POA: Insufficient documentation

## 2022-03-11 DIAGNOSIS — I10 Essential (primary) hypertension: Secondary | ICD-10-CM | POA: Insufficient documentation

## 2022-03-11 DIAGNOSIS — I4891 Unspecified atrial fibrillation: Secondary | ICD-10-CM | POA: Diagnosis not present

## 2022-03-11 DIAGNOSIS — I4819 Other persistent atrial fibrillation: Secondary | ICD-10-CM

## 2022-03-11 DIAGNOSIS — D6869 Other thrombophilia: Secondary | ICD-10-CM | POA: Diagnosis not present

## 2022-03-11 NOTE — Progress Notes (Signed)
Remote pacemaker transmission.   

## 2022-03-11 NOTE — Progress Notes (Signed)
Primary Care Physician: Camillia Herter, NP Referring Physician: Device clinic   Tracey Todd is a 87 y.o. female with a h/o PAF that is now in the afib clinic for device clinic noting ongoing afib since 6/29.I saw pt since then but do not think she has seen cardiology since then except for remote PPM checks. She states that she is unaware and does not feel any different. She has noted some increased in ankle edema. She is on daily furosemide. She is rate controlled. She states no alcohol, no unusual caffeine intake, no apnea. She is her with daughter today. She remains on wafarin with a CHA2DS2VASc  score of at least 6. Checked last 7/11 at 2.2. She is on bisoprolol 10 mg daily for rate control.    Today, she denies symptoms of palpitations, chest pain, shortness of breath, orthopnea, PND, lower extremity edema, dizziness, presyncope, syncope, or neurologic sequela. The patient is tolerating medications without difficulties and is otherwise without complaint today.   Past Medical History:  Diagnosis Date   Atrial flutter ([place])    a. mentioned in 2016 admission.   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 minimal by cath 2016.   Nodule of chest wall    Normal coronary arteries and LVF 10/23/14 10/24/2014   Pacemaker implanted 10/25/14- St Jude 10/26/2014   Paroxysmal atrial fibrillation (HCC)    a. identified on device interrogation, burden low   Pulmonary hypertension (HCC)    Sick sinus syndrome ([place])    a. s/p STJ dual chamber PPM    Type II or unspecified type diabetes mellitus without mention of complication, not stated as uncontrolled    Vitamin D deficiency    Past Surgical History:  Procedure Laterality Date   BREAST SURGERY     CATARACT EXTRACTION     LEFT AND RIGHT HEART CATHETERIZATION WITH CORONARY  ANGIOGRAM N/A 10/23/2014   Procedure: LEFT AND RIGHT HEART CATHETERIZATION WITH CORONARY ANGIOGRAM;  Surgeon: Blane Ohara, MD;  Location: [facility] CATH LAB;  Service: Cardiovascular;  Laterality: N/A;   PERMANENT PACEMAKER INSERTION N/A 10/25/2014   STJ dual chamber PPM implanted by Dr Rayann Heman for SSS   TONSILLECTOMY AND ADENOIDECTOMY     TOTAL KNEE ARTHROPLASTY      Current Outpatient Medications  Medication Sig Dispense Refill   amLODipine (NORVASC) 5 MG tablet Take 1 tablet (5 mg total) by mouth daily. 30 tablet 3   atorvastatin (LIPITOR) 40 MG tablet Take 1 tablet (40 mg total) by mouth daily. 90 tablet 1   bisoprolol (ZEBETA) 10 MG tablet Take 1 tablet by mouth once daily for blood pressure 90 tablet 0   Blood Glucose Monitoring Suppl (ONETOUCH VERIO) w/Device KIT Use to check fasting blood sugar daily. Dx: E11.59 1 kit 0   calcium carbonate (OS-CAL) 600 MG TABS tablet Take 600 mg by mouth daily with breakfast.     cetirizine (ZYRTEC) 10 MG tablet Take 10 mg by mouth every other day.      Cholecalciferol (VITAMIN D) 2000 UNITS CAPS Take 6,000 Units by mouth daily.      furosemide (LASIX) 40 MG tablet Take 1 tablet by mouth once daily 90 tablet 0   gabapentin (NEURONTIN) 300 MG capsule Take 1 capsule (300 mg total) by mouth  2 (two) times daily. 180 capsule 1   glucose blood (ONETOUCH VERIO) test strip Use to check fasting blood sugar daily. Dx: E11.59 100 each 2   Lancets (ONETOUCH DELICA PLUS [id number]) MISC USE TO CHECK FASTING BLOOD SUGAR DAILY 100 each 0   Magnesium 500 MG TABS Take 500 mg by mouth 4 (four) times daily.      metFORMIN (GLUCOPHAGE-XR) 500 MG 24 hr tablet Take 1 tablet (500 mg total) by mouth 2 (two) times daily with a meal. 60 tablet 3   potassium chloride SA (KLOR-CON M) 20 MEQ tablet Take 1 tablet (20 mEq total) by mouth daily. 30 tablet 3   warfarin (COUMADIN) 5 MG tablet TAKE 1 to 1 and 1/2 tablets daily or as DIRECTED BY ANTICOAGULATION  CLINIC 40 tablet 5   No  current facility-administered medications for this encounter.    Allergies  Allergen Reactions   Penicillins Anaphylaxis   Quinapril     Angioedema    Feldene [Piroxicam] Other (See Comments)    Bleeding    Calcium-Containing Compounds Other (See Comments)    "calcium deposits on skin"   Celebrex [Celecoxib] Itching   Daypro [Oxaprozin] Itching    Social History   Socioeconomic History   Marital status: Married    Spouse name: Not on file   Number of children: Not on file   Years of education: Not on file   Highest education level: Not on file  Occupational History   Not on file  Tobacco Use   Smoking status: Former    Types: Cigarettes    Quit date: 12/06/1963    Years since quitting: 58.3    Passive exposure: Never   Smokeless tobacco: Never  Vaping Use   Vaping Use: Never used  Substance and Sexual Activity   Alcohol use: No   Drug use: Never   Sexual activity: Not Currently  Other Topics Concern   Not on file  Social History Narrative   Left Handed    Lives in a one story home    Social Determinants of Health   Financial Resource Strain: Low Risk  (05/28/2021)   Overall Financial Resource Strain (CARDIA)    Difficulty of Paying Living Expenses: Not very hard  Food Insecurity: No Food Insecurity (05/28/2021)   Hunger Vital Sign    Worried About Running Out of Food in the Last Year: Never true    [place] in the Last Year: Never true  Transportation Needs: No Transportation Needs (05/28/2021)   PRAPARE - Hydrologist (Medical): No    Lack of Transportation (Non-Medical): No  Physical Activity: Inactive (05/28/2021)   Exercise Vital Sign    Days of Exercise per Week: 0 days    Minutes of Exercise per Session: 0 min  Stress: No Stress Concern Present (05/28/2021)   [place]    Feeling of Stress : Not at all  Social Connections: Moderately Integrated  (05/28/2021)   Social Connection and Isolation Panel [NHANES]    Frequency of Communication with Friends and Family: More than three times a week    Frequency of Social Gatherings with Friends and Family: More than three times a week    Attends Religious Services: More than 4 times per year    Active Member of [employer] or Organizations: No    Attends Archivist Meetings: Never    Marital Status: Married  Human resources officer Violence: Not  At Risk (05/28/2021)   Humiliation, Afraid, Rape, and Kick questionnaire    Fear of Current or Ex-Partner: No    Emotionally Abused: No    Physically Abused: No    Sexually Abused: No    Family History  Problem Relation Age of Onset   Diabetes Mother    Cancer Mother    Cancer Father     ROS- All systems are reviewed and negative except as per the HPI above  Physical Exam: Vitals:   03/11/22 0941  BP: 106/60  Pulse: 70  Weight: 92.9 kg  Height: 4' 9.01" (1.448 m)   Wt Readings from Last 3 Encounters:  03/11/22 92.9 kg  03/01/22 87.5 kg  02/01/22 88.5 kg    Labs: Lab Results  Component Value Date   NA 143 03/01/2022   K 4.7 03/01/2022   CL 105 03/01/2022   CO2 20 03/01/2022   GLUCOSE 159 (H) 03/01/2022   BUN 20 03/01/2022   CREATININE 0.84 03/01/2022   CALCIUM 9.4 03/01/2022   MG 2.0 08/21/2018   Lab Results  Component Value Date   INR 2.2 03/09/2022   Lab Results  Component Value Date   CHOL 194 11/30/2021   HDL 59 11/30/2021   LDLCALC 113 (H) 11/30/2021   TRIG 122 11/30/2021     GEN- The patient is well appearing, alert and oriented x 3 today.   Head- normocephalic, atraumatic Eyes-  Sclera clear, conjunctiva pink Ears- hearing intact Oropharynx- clear Neck- supple, no JVP Lymph- no cervical lymphadenopathy Lungs- Clear to ausculation bilaterally, normal work of breathing Heart- iregular rate and rhythm, no murmurs, rubs or gallops, PMI not laterally displaced GI- soft, NT, ND, + BS Extremities- no  clubbing, cyanosis, or  mild pedal edema MS- no significant deformity or atrophy Skin- no rash or lesion Psych- euthymic mood, full affect Neuro- strength and sensation are intact  EKG- PR interval * ms QRS duration 172 ms QT/QTcB 456/492 ms P-R-T axes * -77 113 Ventricular-paced rhythm Abnormal ECG When compared with ECG of 06-Mar-2019 07:40, PREVIOUS ECG IS PRESENT    Assessment and Plan:  1. Afib Device clinic shows persistent afib since June  Pt states that she is not that symptomatic but has noted some increase in ankle edema She denies any extra fatigue or shortness of breath  But she sedentary She is rate controlled and is on max bisoprolol and her BP is soft to add additional rate control  I discussed cardioversion but she does not want to pursue at this time  I will bring back in 10 days for further evaluation and discussion    2. HTN Stable   See back in 1-2 weeks sooner if clinically there is a change  Butch Penny C. Novalyn Lajara, [facility] [address] [place], [place] [address] [phone]

## 2022-03-24 ENCOUNTER — Ambulatory Visit (HOSPITAL_COMMUNITY)
Admission: RE | Admit: 2022-03-24 | Discharge: 2022-03-24 | Disposition: A | Discharge status: Home / Self Care | Payer: Medicare Other | Source: Ambulatory Visit | Attending: Nurse Practitioner | Admitting: Nurse Practitioner

## 2022-03-24 ENCOUNTER — Encounter (HOSPITAL_COMMUNITY): Payer: Self-pay | Admitting: Nurse Practitioner

## 2022-03-24 VITALS — BP 136/76 | HR 70 | Ht <= 58 in | Wt 202.4 lb

## 2022-03-24 DIAGNOSIS — D6869 Other thrombophilia: Secondary | ICD-10-CM

## 2022-03-24 DIAGNOSIS — Z950 Presence of cardiac pacemaker: Secondary | ICD-10-CM | POA: Insufficient documentation

## 2022-03-24 DIAGNOSIS — I480 Paroxysmal atrial fibrillation: Secondary | ICD-10-CM | POA: Diagnosis not present

## 2022-03-24 DIAGNOSIS — Z7901 Long term (current) use of anticoagulants: Secondary | ICD-10-CM | POA: Insufficient documentation

## 2022-03-24 DIAGNOSIS — I10 Essential (primary) hypertension: Secondary | ICD-10-CM | POA: Insufficient documentation

## 2022-03-24 DIAGNOSIS — Z79899 Other long term (current) drug therapy: Secondary | ICD-10-CM | POA: Insufficient documentation

## 2022-03-24 DIAGNOSIS — I4819 Other persistent atrial fibrillation: Secondary | ICD-10-CM | POA: Diagnosis not present

## 2022-03-24 NOTE — Progress Notes (Signed)
Primary Care Physician: Rema Fendt, NP Referring Physician: Device clinic EP: Tracey Todd is a 87 y.o. female with a h/o PAF that is now in the afib clinic for device clinic noting ongoing afib since 6/29.I saw pt since then but do not think she has seen cardiology since then except for remote PPM checks. She states that she is unaware and does not feel any different. She has noted some increased in ankle edema. She is on daily furosemide. She is rate controlled. She states no alcohol, no unusual caffeine intake, no apnea. She is her with daughter today. She remains on wafarin with a CHA2DS2VASc  score of at least 6. Checked last 7/11 at 2.2. She is on bisoprolol 10 mg daily for rate control.   F/u in afib clinic, 03/24/22. EKG shows v paced at 70 bpm. She states that she does not  feel different from  her baseline and does not want a cardioversion. She wants to continue usual treatment. Her pedal edema is back to her baseline.She would like to seee Dr. [name] and will arrange.    Today, she denies symptoms of palpitations, chest pain, shortness of breath, orthopnea, PND, lower extremity edema, dizziness, presyncope, syncope, or neurologic sequela. The patient is tolerating medications without difficulties and is otherwise without complaint today.   Past Medical History:  Diagnosis Date   Atrial flutter (HCC)    a. mentioned in 2016 admission.   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 minimal by cath 2016.   Nodule of chest wall    Normal coronary arteries and LVF 10/23/14 10/24/2014   Pacemaker implanted 10/25/14- St Jude 10/26/2014   Paroxysmal atrial fibrillation (HCC)    a. identified on device interrogation, burden low   Pulmonary hypertension (HCC)    Sick sinus syndrome (HCC)    a. s/p STJ  dual chamber PPM    Type II or unspecified type diabetes mellitus without mention of complication, not stated as uncontrolled    Vitamin D deficiency    Past Surgical History:  Procedure Laterality Date   BREAST SURGERY     CATARACT EXTRACTION     LEFT AND RIGHT HEART CATHETERIZATION WITH CORONARY ANGIOGRAM N/A 10/23/2014   Procedure: LEFT AND RIGHT HEART CATHETERIZATION WITH CORONARY ANGIOGRAM;  Surgeon: Micheline Chapman, MD;  Location: [facility] CATH LAB;  Service: Cardiovascular;  Laterality: N/A;   PERMANENT PACEMAKER INSERTION N/A 10/25/2014   STJ dual chamber PPM implanted by Dr [name] for SSS   TONSILLECTOMY AND ADENOIDECTOMY     TOTAL KNEE ARTHROPLASTY      Current Outpatient Medications  Medication Sig Dispense Refill   amLODipine (NORVASC) 5 MG tablet Take 1 tablet (5 mg total) by mouth daily. 30 tablet 3   atorvastatin (LIPITOR) 40 MG tablet Take 1 tablet (40 mg total) by mouth daily. 90 tablet 1   bisoprolol (ZEBETA) 10 MG tablet Take 1 tablet by mouth once daily for blood pressure 90 tablet 0   Blood Glucose Monitoring Suppl (ONETOUCH VERIO) w/Device KIT Use to check fasting blood sugar daily. Dx: E11.59 1 kit 0   calcium carbonate (OS-CAL) 600 MG TABS tablet Take 600 mg by mouth daily with breakfast.     cetirizine (ZYRTEC) 10 MG tablet  Take 10 mg by mouth every other day.      Cholecalciferol (VITAMIN D) 2000 UNITS CAPS Take 6,000 Units by mouth daily.      furosemide (LASIX) 40 MG tablet Take 1 tablet by mouth once daily 90 tablet 0   gabapentin (NEURONTIN) 300 MG capsule Take 1 capsule (300 mg total) by mouth 2 (two) times daily. 180 capsule 1   glucose blood (ONETOUCH VERIO) test strip Use to check fasting blood sugar daily. Dx: E11.59 100 each 2   Lancets (ONETOUCH DELICA PLUS [id number]) MISC USE TO CHECK FASTING BLOOD SUGAR DAILY 100 each 0   Magnesium 500 MG TABS Take 500 mg by mouth 4 (four) times daily.      metFORMIN (GLUCOPHAGE-XR) 500 MG 24 hr tablet Take 1 tablet (500  mg total) by mouth 2 (two) times daily with a meal. 60 tablet 3   potassium chloride SA (KLOR-CON M) 20 MEQ tablet Take 1 tablet (20 mEq total) by mouth daily. 30 tablet 3   warfarin (COUMADIN) 5 MG tablet TAKE 1 to 1 and 1/2 tablets daily or as DIRECTED BY ANTICOAGULATION  CLINIC 40 tablet 5   No current facility-administered medications for this encounter.    Allergies  Allergen Reactions   Penicillins Anaphylaxis   Quinapril     Angioedema    Feldene [Piroxicam] Other (See Comments)    Bleeding    Calcium-Containing Compounds Other (See Comments)    "calcium deposits on skin"   Celebrex [Celecoxib] Itching   Daypro [Oxaprozin] Itching    Social History   Socioeconomic History   Marital status: Married    Spouse name: Not on file   Number of children: Not on file   Years of education: Not on file   Highest education level: Not on file  Occupational History   Not on file  Tobacco Use   Smoking status: Former    Types: Cigarettes    Quit date: 12/06/1963    Years since quitting: 58.3    Passive exposure: Never   Smokeless tobacco: Never  Vaping Use   Vaping Use: Never used  Substance and Sexual Activity   Alcohol use: No   Drug use: Never   Sexual activity: Not Currently  Other Topics Concern   Not on file  Social History Narrative   Left Handed    Lives in a one story home    Social Determinants of Health   Financial Resource Strain: Low Risk  (05/28/2021)   Overall Financial Resource Strain (CARDIA)    Difficulty of Paying Living Expenses: Not very hard  Food Insecurity: No Food Insecurity (05/28/2021)   Hunger Vital Sign    Worried About Running Out of Food in the Last Year: Never true    [place] in the Last Year: Never true  Transportation Needs: No Transportation Needs (05/28/2021)   PRAPARE - Hydrologist (Medical): No    Lack of Transportation (Non-Medical): No  Physical Activity: Inactive (05/28/2021)   Exercise  Vital Sign    Days of Exercise per Week: 0 days    Minutes of Exercise per Session: 0 min  Stress: No Stress Concern Present (05/28/2021)   [place]    Feeling of Stress : Not at all  Social Connections: Moderately Integrated (05/28/2021)   Social Connection and Isolation Panel [NHANES]    Frequency of Communication with Friends and Family: More than three times a  week    Frequency of Social Gatherings with Friends and Family: More than three times a week    Attends Religious Services: More than 4 times per year    Active Member of [employer] or Organizations: No    Attends Archivist Meetings: Never    Marital Status: Married  Human resources officer Violence: Not At Risk (05/28/2021)   Humiliation, Afraid, Rape, and Kick questionnaire    Fear of Current or Ex-Partner: No    Emotionally Abused: No    Physically Abused: No    Sexually Abused: No    Family History  Problem Relation Age of Onset   Diabetes Mother    Cancer Mother    Cancer Father     ROS- All systems are reviewed and negative except as per the HPI above  Physical Exam: There were no vitals filed for this visit.  Wt Readings from Last 3 Encounters:  03/11/22 92.9 kg  03/01/22 87.5 kg  02/01/22 88.5 kg    Labs: Lab Results  Component Value Date   NA 143 03/01/2022   K 4.7 03/01/2022   CL 105 03/01/2022   CO2 20 03/01/2022   GLUCOSE 159 (H) 03/01/2022   BUN 20 03/01/2022   CREATININE 0.84 03/01/2022   CALCIUM 9.4 03/01/2022   MG 2.0 08/21/2018   Lab Results  Component Value Date   INR 2.2 03/09/2022   Lab Results  Component Value Date   CHOL 194 11/30/2021   HDL 59 11/30/2021   LDLCALC 113 (H) 11/30/2021   TRIG 122 11/30/2021     GEN- The patient is well appearing, alert and oriented x 3 today.   Head- normocephalic, atraumatic Eyes-  Sclera clear, conjunctiva pink Ears- hearing intact Oropharynx- clear Neck- supple, no  JVP Lymph- no cervical lymphadenopathy Lungs- Clear to ausculation bilaterally, normal work of breathing Heart- irregular rate and rhythm, no murmurs, rubs or gallops, PMI not laterally displaced GI- soft, NT, ND, + BS Extremities- no clubbing, cyanosis, or  mild pedal edema MS- no significant deformity or atrophy Skin- no rash or lesion Psych- euthymic mood, full affect Neuro- strength and sensation are intact  EKG-Vent. rate 70 BPM PR interval * ms QRS duration 174 ms QT/QTcB 454/490 ms P-R-T axes * -77 114 Ventricular-paced rhythm Abnormal ECG When compared with ECG of 11-Mar-2022 09:59, PREVIOUS ECG IS PRESENT   Assessment and Plan:  1. Afib Device clinic shows persistent afib since June  Pt states that she is not  symptomatic and does not want to pursue cardioversion or change in approach  She denies any extra fatigue or shortness of breath, but she is  sedentary She is rate controlled and is on max bisoprolol   I will get a f/u with Dr. Rayann Heman to further discuss   2. HTN Stable    I will get a f/u with Dr. Rayann Heman to further discuss     Tracey Todd, Tracey Todd [address] [place], [place] [address] [phone]

## 2022-04-06 ENCOUNTER — Ambulatory Visit: Payer: Medicare Other | Admitting: *Deleted

## 2022-04-06 DIAGNOSIS — I480 Paroxysmal atrial fibrillation: Secondary | ICD-10-CM | POA: Diagnosis not present

## 2022-04-06 DIAGNOSIS — Z7901 Long term (current) use of anticoagulants: Secondary | ICD-10-CM | POA: Diagnosis not present

## 2022-04-06 LAB — POCT INR: INR: 2.3 (ref 2.0–3.0)

## 2022-04-06 NOTE — Patient Instructions (Signed)
Description   Continue taking Warfarin 1.5 tablets daily except 1 tablet on Mondays and Fridays. Continue eating 2 serving of greens per week plus your one day of brussels sprouts. Recheck INR in 5 weeks. Coumadin Clinic [phone]

## 2022-04-14 ENCOUNTER — Telehealth: Payer: Self-pay

## 2022-04-14 NOTE — Telephone Encounter (Signed)
Copied from CRM [id number]. Topic: General - Other >> Apr 14, 2022 11:27 AM Tracey Todd wrote: Reason for CRM: The patient has been directed by their podiatrists office to contact their PCP and request an update on their prescription request for diabetic shoes   Please contact the patient further with an update  Do not have anything from her podiatrist office to have provider complete. Pt would need to ensure that podiatry sent form to current PCP Tracey Stabs, FNP

## 2022-04-14 NOTE — Telephone Encounter (Signed)
Received Rx from [facility] to clarify if pt is taking Atorvastatin 40 mg 1x daily, due to pt advised them that she takes it 2x weekly this was the way she should have been taking it back in 2018 per her PCP [place]. When pt came to Ricky Stabs, NP is to be taking Atorvastatin 40 mg 1x daily  Spoke to pt and she states takes 40mg  1x a day no problems

## 2022-04-16 ENCOUNTER — Ambulatory Visit (INDEPENDENT_AMBULATORY_CARE_PROVIDER_SITE_OTHER): Payer: Medicare Other | Admitting: Internal Medicine

## 2022-04-16 ENCOUNTER — Encounter: Payer: Self-pay | Admitting: Internal Medicine

## 2022-04-16 VITALS — BP 128/68 | HR 91 | Ht <= 58 in | Wt 201.0 lb

## 2022-04-16 DIAGNOSIS — I480 Paroxysmal atrial fibrillation: Secondary | ICD-10-CM | POA: Diagnosis not present

## 2022-04-16 DIAGNOSIS — I10 Essential (primary) hypertension: Secondary | ICD-10-CM | POA: Diagnosis not present

## 2022-04-16 LAB — CUP PACEART INCLINIC DEVICE CHECK
Date Time Interrogation Session: 20230818120859
Implantable Lead Implant Date: 20160226
Implantable Lead Implant Date: 20160226
Implantable Lead Location: 753859
Implantable Lead Location: 753860
Implantable Lead Model: 1948
Implantable Pulse Generator Implant Date: 20160226
Pulse Gen Model: 2240
Pulse Gen Serial Number: 7700661

## 2022-04-16 NOTE — Patient Instructions (Addendum)
Medication Instructions:  Your physician recommends that you continue on your current medications as directed. Please refer to the Current Medication list given to you today. *If you need a refill on your cardiac medications before your next appointment, please call your pharmacy*  Lab Work: None ordered. If you have labs (blood work) drawn today and your tests are completely normal, you will receive your results only by: MyChart Message (if you have MyChart) OR A paper copy in the mail If you have any lab test that is abnormal or we need to change your treatment, we will call you to review the results.  Testing/Procedures: None ordered.  Follow-Up:  IN 6 MONTHS with Francis Dowse, PA-C   Next Pacemaker check 05/27/2022.  Important Information About Sugar

## 2022-04-16 NOTE — Progress Notes (Signed)
PCP: Pcp, No Primary Cardiologist: Dr Elease Hashimoto Primary EP:  Dr Fabienne Bruns is a 87 y.o. female who presents today for routine electrophysiology followup.  Since last being seen in our clinic, the patient reports doing very well.  She is active taking care of her husband who is in his 18s.  Today, she denies symptoms of palpitations, chest pain, shortness of breath,  lower extremity edema, dizziness, presyncope, or syncope.  The patient is otherwise without complaint today.   Past Medical History:  Diagnosis Date   Atrial flutter (HCC)    a. mentioned in 2016 admission.   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 minimal by cath 2016.   Nodule of chest wall    Normal coronary arteries and LVF 10/23/14 10/24/2014   Pacemaker implanted 10/25/14- St Jude 10/26/2014   Paroxysmal atrial fibrillation (HCC)    a. identified on device interrogation, burden low   Pulmonary hypertension (HCC)    Sick sinus syndrome (HCC)    a. s/p STJ dual chamber PPM    Type II or unspecified type diabetes mellitus without mention of complication, not stated as uncontrolled    Vitamin D deficiency    Past Surgical History:  Procedure Laterality Date   BREAST SURGERY     CATARACT EXTRACTION     LEFT AND RIGHT HEART CATHETERIZATION WITH CORONARY ANGIOGRAM N/A 10/23/2014   Procedure: LEFT AND RIGHT HEART CATHETERIZATION WITH CORONARY ANGIOGRAM;  Surgeon: Micheline Chapman, MD;  Location: [facility] CATH LAB;  Service: Cardiovascular;  Laterality: N/A;   PERMANENT PACEMAKER INSERTION N/A 10/25/2014   STJ dual chamber PPM implanted by Dr [name] for SSS   TONSILLECTOMY AND ADENOIDECTOMY     TOTAL KNEE ARTHROPLASTY      ROS- all systems are reviewed and negative except as per HPI above  Current Outpatient Medications  Medication Sig Dispense  Refill   amLODipine (NORVASC) 5 MG tablet Take 1 tablet (5 mg total) by mouth daily. 30 tablet 3   atorvastatin (LIPITOR) 40 MG tablet Take 1 tablet (40 mg total) by mouth daily. 90 tablet 1   bisoprolol (ZEBETA) 10 MG tablet Take 1 tablet by mouth once daily for blood pressure 90 tablet 0   Blood Glucose Monitoring Suppl (ONETOUCH VERIO) w/Device KIT Use to check fasting blood sugar daily. Dx: E11.59 1 kit 0   calcium carbonate (OS-CAL) 600 MG TABS tablet Take 600 mg by mouth daily with breakfast.     cetirizine (ZYRTEC) 10 MG tablet Take 10 mg by mouth every other day.      Cholecalciferol (VITAMIN D) 2000 UNITS CAPS Take 6,000 Units by mouth daily.      furosemide (LASIX) 40 MG tablet Take 1 tablet by mouth once daily 90 tablet 0   gabapentin (NEURONTIN) 300 MG capsule Take 1 capsule (300 mg total) by mouth 2 (two) times daily. 180 capsule 1   glucose blood (ONETOUCH VERIO) test strip Use to check fasting blood sugar daily. Dx: E11.59 100 each 2   Lancets (ONETOUCH DELICA PLUS LANCET33G) MISC USE TO CHECK FASTING BLOOD SUGAR DAILY 100 each 0   Magnesium 500 MG TABS Take 500 mg by mouth 4 (four) times daily.      metFORMIN (GLUCOPHAGE-XR) 500 MG 24 hr tablet  Take 1 tablet (500 mg total) by mouth 2 (two) times daily with a meal. 60 tablet 3   potassium chloride SA (KLOR-CON M) 20 MEQ tablet Take 1 tablet (20 mEq total) by mouth daily. 30 tablet 3   warfarin (COUMADIN) 5 MG tablet TAKE 1 to 1 and 1/2 tablets daily or as DIRECTED BY ANTICOAGULATION  CLINIC 40 tablet 5   No current facility-administered medications for this visit.    Physical Exam: Vitals:   04/16/22 0900  BP: 128/68  Pulse: 91  SpO2: (!) 84%  Weight: 201 lb (91.2 kg)  Height: $Remove'4\' 9"'CQKBOkf$  (1.448 m)    GEN- The patient is well appearing, alert and oriented x 3 today.   Head- normocephalic, atraumatic Eyes-  Sclera clear, conjunctiva pink Ears- hearing intact Oropharynx- clear Lungs- Clear to ausculation bilaterally, normal  work of breathing Chest- pacemaker pocket is well healed Heart- Regular rate and rhythm, (paced) GI- soft, NT, ND, + BS Extremities- no clubbing, cyanosis, + edema  Pacemaker interrogation- reviewed in detail today,  See PACEART report    Assessment and Plan:  1. Symptomatic complete heart block Normal pacemaker function See Pace Art report Mode switch V rate reduced to 60 bpm she is device dependant today  2. Persistently in afib Burden is 19% (event started in June) On coumadin Minimally symptomatic and rate controlled Will continue long term rate control strategies per her preference.  Return in 6 months to revisit a rhythm control strategy.  3. Chronic diastolic dysfunction Stable No change required today  4. HTN Stable No change required today   Return in 6 ,months   [name] MD, [facility] 04/16/2022 9:25 AM

## 2022-04-29 NOTE — Telephone Encounter (Signed)
Pt has not heard anything about her shoes,  in speaking, may have not had a clear understanding of process. Pt has been asked to call Podiatrist office where measured for shoes in July, Pt provided with fax # and is calling Podiatrist today request form be faxed over to PCP.

## 2022-05-11 ENCOUNTER — Ambulatory Visit: Payer: Medicare Other | Attending: Cardiology

## 2022-05-11 DIAGNOSIS — I480 Paroxysmal atrial fibrillation: Secondary | ICD-10-CM | POA: Diagnosis not present

## 2022-05-11 DIAGNOSIS — Z7901 Long term (current) use of anticoagulants: Secondary | ICD-10-CM

## 2022-05-11 DIAGNOSIS — Z5181 Encounter for therapeutic drug level monitoring: Secondary | ICD-10-CM

## 2022-05-11 LAB — POCT INR: INR: 3.6 — AB (ref 2.0–3.0)

## 2022-05-11 NOTE — Patient Instructions (Signed)
HOLD TODAY ONLY and then Continue taking Warfarin 1.5 tablets daily except 1 tablet on Mondays and Fridays. Continue eating 2 serving of greens per week plus your one day of brussels sprouts. Recheck INR in 4 weeks. Coumadin Clinic [phone]

## 2022-05-12 ENCOUNTER — Other Ambulatory Visit: Payer: Self-pay | Admitting: Family

## 2022-05-12 DIAGNOSIS — E1159 Type 2 diabetes mellitus with other circulatory complications: Secondary | ICD-10-CM

## 2022-05-26 ENCOUNTER — Ambulatory Visit (INDEPENDENT_AMBULATORY_CARE_PROVIDER_SITE_OTHER): Payer: Medicare Other | Admitting: Podiatry

## 2022-05-26 ENCOUNTER — Encounter: Payer: Self-pay | Admitting: Podiatry

## 2022-05-26 DIAGNOSIS — D689 Coagulation defect, unspecified: Secondary | ICD-10-CM | POA: Diagnosis not present

## 2022-05-26 DIAGNOSIS — M79676 Pain in unspecified toe(s): Secondary | ICD-10-CM

## 2022-05-26 DIAGNOSIS — M2010 Hallux valgus (acquired), unspecified foot: Secondary | ICD-10-CM

## 2022-05-26 DIAGNOSIS — B351 Tinea unguium: Secondary | ICD-10-CM

## 2022-05-26 DIAGNOSIS — E1142 Type 2 diabetes mellitus with diabetic polyneuropathy: Secondary | ICD-10-CM | POA: Diagnosis not present

## 2022-05-26 NOTE — Progress Notes (Signed)
This patient returns to my office for at risk foot care.  This patient requires this care by a professional since this patient will be at risk due to having diabetes and coagulation defect.  Patient is taking coumadin.  This patient is unable to cut nails herself since the patient cannot reach her nails.These nails are painful walking and wearing shoes.  This patient presents for at risk foot care today.  General Appearance  Alert, conversant and in no acute stress.  Vascular  Dorsalis pedis and posterior tibial  pulses are weakly  palpable  bilaterally.  Capillary return is within normal limits  bilaterally. Temperature is within normal limits  Bilaterally. Absent hair.  Neurologic  Senn-Weinstein monofilament wire test within normal limits  bilaterally. Muscle power within normal limits bilaterally.  Nails Thick disfigured discolored nails with subungual debris  from hallux to fifth toes bilaterally. No evidence of bacterial infection or drainage bilaterally.  Orthopedic  No limitations of motion  feet .  No crepitus or effusions noted.  No bony pathology or digital deformities noted.  Skin  normotropic skin with no porokeratosis noted bilaterally.  No signs of infections or ulcers noted.     Onychomycosis  Pain in right toes  Pain in left toes  Consent was obtained for treatment procedures.   Mechanical debridement of nails 1-5  bilaterally performed with a nail nipper. No dremel usage.  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   Return office visit   3 months                   Told patient to return for periodic foot care and evaluation due to potential at risk complications.   Rudra Hobbins DPM  

## 2022-05-27 ENCOUNTER — Ambulatory Visit (INDEPENDENT_AMBULATORY_CARE_PROVIDER_SITE_OTHER): Payer: Medicare Other

## 2022-05-27 DIAGNOSIS — I495 Sick sinus syndrome: Secondary | ICD-10-CM

## 2022-05-27 LAB — CUP PACEART REMOTE DEVICE CHECK
Battery Remaining Longevity: 29 mo
Battery Remaining Percentage: 25 %
Battery Voltage: 2.93 V
Brady Statistic AP VP Percent: 0 %
Brady Statistic AP VS Percent: 0 %
Brady Statistic AS VP Percent: 0 %
Brady Statistic AS VS Percent: 0 %
Brady Statistic RA Percent Paced: 1 %
Brady Statistic RV Percent Paced: 99 %
Date Time Interrogation Session: 20230928040017
Implantable Lead Implant Date: 20160226
Implantable Lead Implant Date: 20160226
Implantable Lead Location: 753859
Implantable Lead Location: 753860
Implantable Lead Model: 1948
Implantable Pulse Generator Implant Date: 20160226
Lead Channel Impedance Value: 360 Ohm
Lead Channel Impedance Value: 530 Ohm
Lead Channel Pacing Threshold Amplitude: 0.625 V
Lead Channel Pacing Threshold Amplitude: 0.75 V
Lead Channel Pacing Threshold Pulse Width: 0.4 ms
Lead Channel Pacing Threshold Pulse Width: 0.4 ms
Lead Channel Sensing Intrinsic Amplitude: 3.1 mV
Lead Channel Sensing Intrinsic Amplitude: 8.2 mV
Lead Channel Setting Pacing Amplitude: 1.625
Lead Channel Setting Pacing Amplitude: 2.5 V
Lead Channel Setting Pacing Pulse Width: 0.4 ms
Lead Channel Setting Sensing Sensitivity: 2 mV
Pulse Gen Model: 2240
Pulse Gen Serial Number: 7700661

## 2022-06-02 NOTE — Progress Notes (Signed)
Remote pacemaker transmission.   

## 2022-06-08 ENCOUNTER — Ambulatory Visit: Payer: Medicare Other | Attending: Internal Medicine | Admitting: *Deleted

## 2022-06-08 DIAGNOSIS — Z7901 Long term (current) use of anticoagulants: Secondary | ICD-10-CM

## 2022-06-08 DIAGNOSIS — I480 Paroxysmal atrial fibrillation: Secondary | ICD-10-CM | POA: Diagnosis not present

## 2022-06-08 LAB — POCT INR: INR: 3.2 — AB (ref 2.0–3.0)

## 2022-06-08 NOTE — Patient Instructions (Signed)
Description   Take 1 tablet today and then start taking Warfarin 1.5 tablets daily except 1 tablet on Mondays, Wednesday and Fridays. Continue eating 2 serving of greens per week plus your one day of brussels sprouts. Recheck INR in 3 weeks. Coumadin Clinic [phone]

## 2022-06-10 ENCOUNTER — Telehealth: Payer: Self-pay | Admitting: Internal Medicine

## 2022-06-10 DIAGNOSIS — I480 Paroxysmal atrial fibrillation: Secondary | ICD-10-CM

## 2022-06-10 DIAGNOSIS — Z7901 Long term (current) use of anticoagulants: Secondary | ICD-10-CM

## 2022-06-10 MED ORDER — WARFARIN SODIUM 5 MG PO TABS: ORAL_TABLET | ORAL | 3 refills | Status: DC

## 2022-06-10 NOTE — Telephone Encounter (Signed)
Pt c/o medication issue:  1. Name of Medication: warfarin (COUMADIN) 5 MG tablet  2. How are you currently taking this medication (dosage and times per day)? TAKE 1 to 1 and 1/2 tablets daily or as [place]  3. Are you having a reaction (difficulty breathing--STAT)? no  4. What is your medication issue? Pharmacy is calling to see if there was changes made to patient medication. Patient is saying at her appt changes was made. Pateint is needed a refill. Please advise

## 2022-06-10 NOTE — Telephone Encounter (Signed)
Returned call to Pharmacy and spoke with pharmacist and advised the pt dose changed a day to 1.5 tablets-4 days of the week and 1 tablet-3 days of the week; therefore 40 tablets is a 30 day supply. Sent an updated rx today

## 2022-06-22 NOTE — Progress Notes (Signed)
Patient ID: Tracey Todd, female    DOB: July 29, 1935  MRN: [id number]  CC: Chronic Care Management   Subjective: Tracey Todd is a 87 y.o. female who presents for chronic care management.   Her concerns today include:  Doing well on Metformin, no issues/concerns. Patient elects to watchful wait right lower extremity small bump no drainage or pain and notify me when she is ready for referral to Dermatology.   Patient Active Problem List   Diagnosis Date Noted   Hav (hallux abducto valgus), unspecified laterality 02/19/2022   Right ear impacted cerumen 11/06/2021   Chronic arthropathy 10/12/2019   Callus 10/12/2019   Long term (current) use of anticoagulants 10/02/2018   Atherosclerosis of aorta (HCC) 07/20/2017   Diabetic neuropathy (HCC) 07/20/2017   Moderate to severe pulmonary hypertension (HCC) 11/20/2015   BMI 34.0-34.9,adult 08/06/2015   Diabetes mellitus type 2, controlled (HCC) 04/25/2015   Sick sinus syndrome (HCC) 01/29/2015   Pacemaker implanted 10/25/14- St Jude 10/26/2014   Essential hypertension 10/22/2014   PAF-fib and flutter 10/22/2014   Congestive heart failure (HCC) 10/21/2014   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 30 tablet 3   atorvastatin (LIPITOR) 40 MG tablet Take 1 tablet (40 mg total) by mouth daily. 90 tablet 1   bisoprolol (ZEBETA) 10 MG tablet Take 1 tablet by mouth once daily for blood pressure 90 tablet 0   Blood Glucose Monitoring Suppl (ONETOUCH VERIO) w/Device KIT Use to check fasting blood sugar daily. Dx: E11.59 1 kit 0   calcium carbonate (OS-CAL) 600 MG TABS tablet Take 600 mg by mouth daily with  breakfast.     cetirizine (ZYRTEC) 10 MG tablet Take 10 mg by mouth every other day.      Cholecalciferol (VITAMIN D) 2000 UNITS CAPS Take 6,000 Units by mouth daily.      furosemide (LASIX) 40 MG tablet Take 1 tablet by mouth once daily 90 tablet 0   gabapentin (NEURONTIN) 300 MG capsule Take 1 capsule (300 mg total) by mouth 2 (two) times daily. 180 capsule 1   glucose blood (ONETOUCH VERIO) test strip USE TO CHECK FASTING BLOOD SUGAR DAILY 100 each 11   Lancets (ONETOUCH DELICA PLUS LANCET33G) MISC USE TO CHECK FASTING BLOOD SUGAR DAILY 100 each 0   Magnesium 500 MG TABS Take 500 mg by mouth 4 (four) times daily.      potassium chloride SA (KLOR-CON M) 20 MEQ tablet Take 1 tablet (20 mEq total) by mouth daily. 30 tablet 3   warfarin (COUMADIN) 5 MG tablet Take 1.5 tablets daily except 1 tablet on Mondays, Wednesday and Fridays daily or as DIRECTED BY ANTICOAGULATION  CLINIC 40 tablet 3   No current facility-administered medications on file prior to visit.    Allergies  Allergen Reactions   Penicillins Anaphylaxis   Quinapril     Angioedema    Feldene [Piroxicam] Other (See Comments)    Bleeding    Calcium-Containing Compounds Other (See Comments)    "calcium deposits on skin"   Celebrex [Celecoxib] Itching   Daypro [Oxaprozin] Itching    Social History  Socioeconomic History   Marital status: Married    Spouse name: Not on file   Number of children: Not on file   Years of education: Not on file   Highest education level: Not on file  Occupational History   Not on file  Tobacco Use   Smoking status: Former    Types: Cigarettes    Quit date: 12/06/1963    Years since quitting: 58.6    Passive exposure: Never   Smokeless tobacco: Never  Vaping Use   Vaping Use: Never used  Substance and Sexual Activity   Alcohol use: No   Drug use: Never   Sexual activity: Not Currently  Other Topics Concern   Not on file  Social History Narrative   Left Handed    Lives in a one  story home    Social Determinants of Health   Financial Resource Strain: Low Risk  (05/28/2021)   Overall Financial Resource Strain (CARDIA)    Difficulty of Paying Living Expenses: Not very hard  Food Insecurity: No Food Insecurity (05/28/2021)   Hunger Vital Sign    Worried About Running Out of Food in the Last Year: Never true    [place] in the Last Year: Never true  Transportation Needs: No Transportation Needs (05/28/2021)   PRAPARE - Hydrologist (Medical): No    Lack of Transportation (Non-Medical): No  Physical Activity: Inactive (05/28/2021)   Exercise Vital Sign    Days of Exercise per Week: 0 days    Minutes of Exercise per Session: 0 min  Stress: No Stress Concern Present (05/28/2021)   [place]    Feeling of Stress : Not at all  Social Connections: Moderately Integrated (05/28/2021)   Social Connection and Isolation Panel [NHANES]    Frequency of Communication with Friends and Family: More than three times a week    Frequency of Social Gatherings with Friends and Family: More than three times a week    Attends Religious Services: More than 4 times per year    Active Member of [employer] or Organizations: No    Attends Archivist Meetings: Never    Marital Status: Married  Human resources officer Violence: Not At Risk (05/28/2021)   Humiliation, Afraid, Rape, and Kick questionnaire    Fear of Current or Ex-Partner: No    Emotionally Abused: No    Physically Abused: No    Sexually Abused: No    Family History  Problem Relation Age of Onset   Diabetes Mother    Cancer Mother    Cancer Father     Past Surgical History:  Procedure Laterality Date   BREAST SURGERY     CATARACT EXTRACTION     LEFT AND RIGHT HEART CATHETERIZATION WITH CORONARY ANGIOGRAM N/A 10/23/2014   Procedure: LEFT AND RIGHT HEART CATHETERIZATION WITH CORONARY ANGIOGRAM;  Surgeon: Blane Ohara,  MD;  Location: [facility] CATH LAB;  Service: Cardiovascular;  Laterality: N/A;   PERMANENT PACEMAKER INSERTION N/A 10/25/2014   STJ dual chamber PPM implanted by Dr Rayann Heman for SSS   TONSILLECTOMY AND ADENOIDECTOMY     TOTAL KNEE ARTHROPLASTY      ROS: Review of Systems Negative except as stated above  PHYSICAL EXAM: BP 124/73 (BP Location: Right Arm, Patient Position: Sitting, Cuff Size: Large)   Pulse 61   Temp 98.3 F (36.8 C)   Resp 16   Ht 4' 9.01" (1.448 m)  Wt 199 lb (90.3 kg)   SpO2 91%   BMI 43.05 kg/m   Physical Exam HENT:     Head: Normocephalic and atraumatic.  Eyes:     Extraocular Movements: Extraocular movements intact.     Conjunctiva/sclera: Conjunctivae normal.     Pupils: Pupils are equal, round, and reactive to light.  Cardiovascular:     Rate and Rhythm: Normal rate and regular rhythm.     Pulses: Normal pulses.     Heart sounds: Normal heart sounds.  Pulmonary:     Effort: Pulmonary effort is normal.     Breath sounds: Normal breath sounds.  Musculoskeletal:     Cervical back: Normal range of motion and neck supple.  Skin:    General: Skin is warm and dry.  Neurological:     General: No focal deficit present.     Mental Status: She is alert and oriented to person, place, and time.  Psychiatric:        Mood and Affect: Mood normal.        Behavior: Behavior normal.    Results for orders placed or performed in visit on 06/30/22  POCT glycosylated hemoglobin (Hb A1C)  Result Value Ref Range   Hemoglobin A1C 7.9 (A) 4.0 - 5.6 %   HbA1c POC (<> result, manual entry)     HbA1c, POC (prediabetic range)     HbA1c, POC (controlled diabetic range)      ASSESSMENT AND PLAN: 1. Type 2 diabetes mellitus with diabetic mononeuropathy, without long-term current use of insulin (HCC) - Hemoglobin A1C at goal at 7.9%, goal 8.0%. This is improved from previous 8.3%. - Continue Metformin as prescribed. - Discussed the importance of healthy eating habits,  low-carbohydrate diet, low-sugar diet, regular aerobic exercise (at least 150 minutes a week as tolerated) and medication compliance to achieve or maintain control of diabetes. - Follow-up with primary provider in 3 months or sooner if needed.  - POCT glycosylated hemoglobin (Hb A1C) - metFORMIN (GLUCOPHAGE-XR) 500 MG 24 hr tablet; Take 1 tablet (500 mg total) by mouth 2 (two) times daily with a meal.  Dispense: 60 tablet; Refill: 2  2. Primary hypertension 3. Atrial fibrillation, unspecified type (HCC)\ 4. PAF-fib and flutter 5. Long term current use of anticoagulant therapy 6. Congestive heart failure, unspecified HF chronicity, unspecified heart failure type ([place]) 7. Complete heart block ([place]) 8. Sick sinus syndrome ([place]) - Keep all scheduled appointments at Cardiology.    Patient was given the opportunity to ask questions.  Patient verbalized understanding of the plan and was able to repeat key elements of the plan. Patient was given clear instructions to go to Emergency Department or return to medical center if symptoms don't improve, worsen, or new problems develop.The patient verbalized understanding.   Orders Placed This Encounter  Procedures   POCT glycosylated hemoglobin (Hb A1C)    Requested Prescriptions   Signed Prescriptions Disp Refills   metFORMIN (GLUCOPHAGE-XR) 500 MG 24 hr tablet 60 tablet 2    Sig: Take 1 tablet (500 mg total) by mouth 2 (two) times daily with a meal.    Return in about 3 months (around 09/30/2022) for Follow-Up or next available chronic care mgmt .  Camillia Herter, NP

## 2022-06-23 ENCOUNTER — Other Ambulatory Visit: Payer: Self-pay | Admitting: Family

## 2022-06-23 DIAGNOSIS — I10 Essential (primary) hypertension: Secondary | ICD-10-CM

## 2022-06-23 NOTE — Telephone Encounter (Signed)
Order complete. Patient  established with Cardiology. Please call patient to see if cardiologist is managing blood pressure medication refills. Seems they discussed the same during appointment 04/16/2022. Also, has an upcoming appointment with them on 07/01/2022.

## 2022-06-23 NOTE — Telephone Encounter (Signed)
Order complete. Patient  established with Cardiology. Please call patient to see if cardiologist is managing blood pressure medication refills. Seems they discussed the same during appointment 04/16/2022. Also, has an upcoming appointment with them on 07/01/2022.

## 2022-06-25 ENCOUNTER — Other Ambulatory Visit: Payer: Self-pay | Admitting: Family

## 2022-06-25 DIAGNOSIS — Z1231 Encounter for screening mammogram for malignant neoplasm of breast: Secondary | ICD-10-CM

## 2022-06-28 ENCOUNTER — Ambulatory Visit: Payer: Medicare Other | Admitting: Neurology

## 2022-06-30 ENCOUNTER — Ambulatory Visit: Payer: Medicare Other | Admitting: Family

## 2022-06-30 ENCOUNTER — Encounter: Payer: Self-pay | Admitting: Family

## 2022-06-30 VITALS — BP 124/73 | HR 61 | Temp 98.3°F | Resp 16 | Ht <= 58 in | Wt 199.0 lb

## 2022-06-30 DIAGNOSIS — E1141 Type 2 diabetes mellitus with diabetic mononeuropathy: Secondary | ICD-10-CM | POA: Diagnosis not present

## 2022-06-30 DIAGNOSIS — I480 Paroxysmal atrial fibrillation: Secondary | ICD-10-CM

## 2022-06-30 DIAGNOSIS — I10 Essential (primary) hypertension: Secondary | ICD-10-CM

## 2022-06-30 DIAGNOSIS — I4891 Unspecified atrial fibrillation: Secondary | ICD-10-CM

## 2022-06-30 DIAGNOSIS — I509 Heart failure, unspecified: Secondary | ICD-10-CM

## 2022-06-30 DIAGNOSIS — I495 Sick sinus syndrome: Secondary | ICD-10-CM

## 2022-06-30 DIAGNOSIS — Z7901 Long term (current) use of anticoagulants: Secondary | ICD-10-CM

## 2022-06-30 DIAGNOSIS — I442 Atrioventricular block, complete: Secondary | ICD-10-CM

## 2022-06-30 LAB — POCT GLYCOSYLATED HEMOGLOBIN (HGB A1C): Hemoglobin A1C: 7.9 % — AB (ref 4.0–5.6)

## 2022-06-30 MED ORDER — METFORMIN HCL ER 500 MG PO TB24: 500.0000 mg | ORAL_TABLET | Freq: Two times a day (BID) | ORAL | 2 refills | Status: DC

## 2022-06-30 NOTE — Progress Notes (Signed)
.  Pt presents for chronic care management   -pt has dark spot on side of right leg and bumps arising on top of right leg present for about 1 month

## 2022-07-01 ENCOUNTER — Ambulatory Visit: Payer: Medicare Other | Attending: Cardiology

## 2022-07-01 DIAGNOSIS — I480 Paroxysmal atrial fibrillation: Secondary | ICD-10-CM | POA: Diagnosis not present

## 2022-07-01 DIAGNOSIS — Z7901 Long term (current) use of anticoagulants: Secondary | ICD-10-CM | POA: Diagnosis not present

## 2022-07-01 LAB — POCT INR: INR: 3.2 — AB (ref 2.0–3.0)

## 2022-07-01 NOTE — Patient Instructions (Signed)
Continue 1.5 tablets daily except 1 tablet on Mondays, Wednesday and Fridays. Continue eating 2 serving of greens per week plus your one day of brussels sprouts. Recheck INR in 4 weeks. Coumadin Clinic [phone]

## 2022-07-15 ENCOUNTER — Other Ambulatory Visit: Payer: Self-pay | Admitting: Family

## 2022-07-15 DIAGNOSIS — I10 Essential (primary) hypertension: Secondary | ICD-10-CM

## 2022-07-15 NOTE — Telephone Encounter (Signed)
Requested Prescriptions  Pending Prescriptions Disp Refills   furosemide (LASIX) 40 MG tablet [Pharmacy Med Name: Furosemide 40 MG Oral Tablet] 90 tablet 0    Sig: Take 1 tablet by mouth once daily     Cardiovascular:  Diuretics - Loop Failed - 07/15/2022  9:53 AM      Failed - Mg Level in normal range and within 180 days    Magnesium  Date Value Ref Range Status  08/21/2018 2.0 1.6 - 2.3 mg/dL Final         Passed - K in normal range and within 180 days    Potassium  Date Value Ref Range Status  03/01/2022 4.7 3.5 - 5.2 mmol/L Final         Passed - Ca in normal range and within 180 days    Calcium  Date Value Ref Range Status  03/01/2022 9.4 8.7 - 10.3 mg/dL Final   Calcium, Ion  Date Value Ref Range Status  12/07/2009 1.08 (L) 1.12 - 1.32 mmol/L Final         Passed - Na in normal range and within 180 days    Sodium  Date Value Ref Range Status  03/01/2022 143 134 - 144 mmol/L Final         Passed - Cr in normal range and within 180 days    Creat  Date Value Ref Range Status  06/02/2018 0.63 0.60 - 0.88 mg/dL Final    Comment:    For patients >30 years of age, the reference limit for Creatinine is approximately 13% higher for people identified as African-American. .    Creatinine, Ser  Date Value Ref Range Status  03/01/2022 0.84 0.57 - 1.00 mg/dL Final   Creatinine, Urine  Date Value Ref Range Status  06/02/2018 45 20 - 275 mg/dL Final         Passed - Cl in normal range and within 180 days    Chloride  Date Value Ref Range Status  03/01/2022 105 96 - 106 mmol/L Final         Passed - Last BP in normal range    BP Readings from Last 1 Encounters:  06/30/22 124/73         Passed - Valid encounter within last 6 months    Recent Outpatient Visits           2 weeks ago Primary hypertension   Primary Care at [facility], Amy J, NP   4 months ago Essential (primary) hypertension   Primary Care at [facility], Amy J, NP    5 months ago Chronic left hip pain   Primary Care at [facility], Amy J, NP   7 months ago Essential (primary) hypertension   Primary Care at [facility], Amy J, NP   8 months ago Right ear impacted cerumen   Primary Care at [facility], Gildardo Pounds, NP       Future Appointments             In 2 months Rema Fendt, NP Primary Care at [facility]

## 2022-07-16 ENCOUNTER — Ambulatory Visit: Payer: Self-pay

## 2022-07-16 ENCOUNTER — Telehealth: Payer: Self-pay | Admitting: Internal Medicine

## 2022-07-16 NOTE — Telephone Encounter (Signed)
Pt c/o swelling: STAT is pt has developed SOB within 24 hours  If swelling, where is the swelling located? feet, ankles and legs  How much weight have you gained and in what time span? 2lb to 4 lbs a week  Have you gained 3 pounds in a day or 5 pounds in a week?   Do you have a log of your daily weights (if so, list)? yes  Are you currently taking a fluid pill? yes  Are you currently SOB? No, not atthis time- short of breath when moving around Have you traveled recently? no

## 2022-07-16 NOTE — Telephone Encounter (Signed)
Scheduled appt with PCP

## 2022-07-16 NOTE — Telephone Encounter (Signed)
Returned call to patient. She states she is having BLE swelling that she describes as bothersome and states her legs on the "outsides are sort of hard." Denies any signs of lack of circulation (paraesthesias) or discoloration. No localized pain. Unsure when swelling began and states it comes and goes. Feels more short of breath when moving around. States PCP does not follow this. She has not been seen by general cardilogist since visit with Ronie Spies in 2019, and prior to that, Dr [name] in 2016. Followed with Dr [name] for EP-not been seen in over 2 years. Needs to re-establish care. Scheduled with DOD for Monday (chandrasekhar).

## 2022-07-16 NOTE — Telephone Encounter (Signed)
  Chief Complaint: leg pain  Symptoms: RLE pain 7/10, numbness, swelling more than normal Frequency: 3-4 days Pertinent Negatives: NA Disposition: [] ED /[] Urgent Care (no appt availability in office) / [] Appointment(In office/virtual)/ []  [facility]/ [] Home Care/ [] Refused Recommended Disposition /[x]  Mobile Bus/ []  Follow-up with PCP Additional Notes: pt states that she has swelling in LE but R is worse than usual. Has been taking Tylenol and Gabapentin for pain but not really helping. Pt states she doesn't have transportation until Monday so recommended she could go to [facility] on 07/19/22 and gave location details. Pt states she will try to do that but I advised if sx get worse before Monday she can go to [facility] or ED and be seen. Pt verbalized understanding.   Reason for Disposition  Numbness in a leg or foot (i.e., loss of sensation)  Answer Assessment - Initial Assessment Questions 1. ONSET: "When did the pain start?"      3-4 days  2. LOCATION: "Where is the pain located?"      RLE  3. PAIN: "How bad is the pain?"    (Scale 1-10; or mild, moderate, severe)   -  MILD (1-3): doesn't interfere with normal activities    -  MODERATE (4-7): interferes with normal activities (e.g., work or school) or awakens from sleep, limping    -  SEVERE (8-10): excruciating pain, unable to do any normal activities, unable to walk     7 6. OTHER SYMPTOMS: "Do you have any other symptoms?" (e.g., chest pain, back pain, breathing difficulty, swelling, rash, fever, numbness, weakness)     Numbness and swelling more than usual  Protocols used: Leg Pain-A-AH

## 2022-07-17 ENCOUNTER — Other Ambulatory Visit: Payer: Self-pay | Admitting: Family

## 2022-07-17 DIAGNOSIS — I10 Essential (primary) hypertension: Secondary | ICD-10-CM

## 2022-07-19 ENCOUNTER — Encounter: Payer: Self-pay | Admitting: Internal Medicine

## 2022-07-19 ENCOUNTER — Ambulatory Visit: Payer: Medicare Other | Attending: Internal Medicine | Admitting: Internal Medicine

## 2022-07-19 VITALS — BP 124/62 | HR 67 | Ht <= 58 in | Wt 200.2 lb

## 2022-07-19 DIAGNOSIS — I509 Heart failure, unspecified: Secondary | ICD-10-CM | POA: Diagnosis not present

## 2022-07-19 DIAGNOSIS — I25118 Atherosclerotic heart disease of native coronary artery with other forms of angina pectoris: Secondary | ICD-10-CM

## 2022-07-19 DIAGNOSIS — I480 Paroxysmal atrial fibrillation: Secondary | ICD-10-CM | POA: Diagnosis not present

## 2022-07-19 MED ORDER — FUROSEMIDE 40 MG PO TABS: 40.0000 mg | ORAL_TABLET | Freq: Two times a day (BID) | ORAL | 3 refills | Status: DC

## 2022-07-19 MED ORDER — SPIRONOLACTONE 25 MG PO TABS: 12.5000 mg | ORAL_TABLET | Freq: Every day | ORAL | 3 refills | Status: DC

## 2022-07-19 NOTE — Progress Notes (Deleted)
Patient ID: Tracey Todd, female    DOB: 14-Oct-1934  MRN: [id number]  CC: No chief complaint on file.   Subjective: Tracey Todd is a 87 y.o. female who presents for  Her concerns today include:  07/16/2022 per triage RN note: Chief Complaint: leg pain  Symptoms: RLE pain 7/10, numbness, swelling more than normal Frequency: 3-4 days Pertinent Negatives: NA Disposition: _0 ED /_1 Urgent Care (no appt availability in office) / _2 Appointment(In office/virtual)/ _3  [facility]/ _4 Home Care/ _5 Refused Recommended Disposition /_6 [facility]/ _7  Follow-up with PCP Additional Notes: pt states that she has swelling in LE but R is worse than usual. Has been taking Tylenol and Gabapentin for pain but not really helping. Pt states she doesn't have transportation until Monday so recommended she could go to [facility] on 07/19/22 and gave location details. Pt states she will try to do that but I advised if sx get worse before Monday she can go to [facility] or ED and be seen. Pt verbalized understanding.    Reason for Disposition  Numbness in a leg or foot (i.e., loss of sensation)  Answer Assessment - Initial Assessment Questions 1. ONSET: "When did the pain start?"      3-4 days  2. LOCATION: "Where is the pain located?"      RLE  3. PAIN: "How bad is the pain?"    (Scale 1-10; or mild, moderate, severe)   -  MILD (1-3): doesn't interfere with normal activities    -  MODERATE (4-7): interferes with normal activities (e.g., work or school) or awakens from sleep, limping    -  SEVERE (8-10): excruciating pain, unable to do any normal activities, unable to walk     7 6. OTHER SYMPTOMS: "Do you have any other symptoms?" (e.g., chest pain, back pain, breathing difficulty, swelling, rash, fever, numbness, weakness)     Numbness and swelling more than usual  Protocols used: Leg Pain-A-AH  Today's visit 07/20/2022:   Patient Active Problem List   Diagnosis Date Noted   Hav (hallux  abducto valgus), unspecified laterality 02/19/2022   Right ear impacted cerumen 11/06/2021   Chronic arthropathy 10/12/2019   Callus 10/12/2019   Long term (current) use of anticoagulants 10/02/2018   Atherosclerosis of aorta ([place]) 07/20/2017   Diabetic neuropathy ([place]) 07/20/2017   Moderate to severe pulmonary hypertension ([place]) 11/20/2015   BMI 34.0-34.9,adult 08/06/2015   Diabetes mellitus type 2, controlled ([place]) 04/25/2015   Sick sinus syndrome ([place]) 01/29/2015   Pacemaker implanted 10/25/14- [employer] 10/26/2014   Essential hypertension 10/22/2014   PAF-fib and flutter 10/22/2014   Congestive heart failure ([place]) 10/21/2014   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 30 tablet 3   atorvastatin (LIPITOR) 40 MG tablet Take 1 tablet (40 mg total) by mouth daily. 90 tablet 1   bisoprolol (ZEBETA) 10 MG tablet Take 1 tablet by mouth once daily for blood pressure 90 tablet 0   Blood Glucose Monitoring Suppl (ONETOUCH VERIO) w/Device KIT Use to check fasting blood sugar daily. Dx: E11.59 1 kit 0   calcium carbonate (OS-CAL) 600 MG  TABS tablet Take 600 mg by mouth daily with breakfast.     cetirizine (ZYRTEC) 10 MG tablet Take 10 mg by mouth every other day.      Cholecalciferol (VITAMIN D) 2000 UNITS CAPS Take 6,000 Units by mouth daily.      furosemide (LASIX) 40 MG tablet Take 1 tablet by mouth once daily 90 tablet 0   gabapentin (NEURONTIN) 300 MG capsule Take 1 capsule (300 mg total) by mouth 2 (two) times daily. 180 capsule 1   glucose blood (ONETOUCH VERIO) test strip USE TO CHECK FASTING BLOOD SUGAR DAILY 100 each 11   Lancets (ONETOUCH DELICA PLUS  [id number]) MISC USE TO CHECK FASTING BLOOD SUGAR DAILY 100 each 0   Magnesium 500 MG TABS Take 500 mg by mouth 4 (four) times daily.      metFORMIN (GLUCOPHAGE-XR) 500 MG 24 hr tablet Take 1 tablet (500 mg total) by mouth 2 (two) times daily with a meal. 60 tablet 2   potassium chloride SA (KLOR-CON M) 20 MEQ tablet Take 1 tablet (20 mEq total) by mouth daily. 30 tablet 3   warfarin (COUMADIN) 5 MG tablet Take 1.5 tablets daily except 1 tablet on Mondays, Wednesday and Fridays daily or as [place] 40 tablet 3   No current facility-administered medications on file prior to visit.    Allergies  Allergen Reactions   Penicillins Anaphylaxis   Quinapril     Angioedema    Feldene [Piroxicam] Other (See Comments)    Bleeding    Calcium-Containing Compounds Other (See Comments)    "calcium deposits on skin"   Celebrex [Celecoxib] Itching   Daypro [Oxaprozin] Itching    Social History   Socioeconomic History   Marital status: Married    Spouse name: Not on file   Number of children: Not on file   Years of education: Not on file   Highest education level: Not on file  Occupational History   Not on file  Tobacco Use   Smoking status: Former    Types: Cigarettes    Quit date: 12/06/1963    Years since quitting: 58.6    Passive exposure: Never   Smokeless tobacco: Never  Vaping Use   Vaping Use: Never used  Substance and Sexual Activity   Alcohol use: No   Drug use: Never   Sexual activity: Not Currently  Other Topics Concern   Not on file  Social History Narrative   Left Handed    Lives in a one story home    Social Determinants of Health   Financial Resource Strain: Low Risk  (05/28/2021)   Overall Financial Resource Strain (CARDIA)    Difficulty of Paying Living Expenses: Not very hard  Food Insecurity: No Food Insecurity (05/28/2021)   Hunger Vital Sign    Worried About Running Out of Food in the Last Year: Never true    [place] in the  Last Year: Never true  Transportation Needs: No Transportation Needs (05/28/2021)   PRAPARE - Hydrologist (Medical): No    Lack of Transportation (Non-Medical): No  Physical Activity: Inactive (05/28/2021)   Exercise Vital Sign    Days of Exercise per Week: 0 days    Minutes of Exercise per Session: 0 min  Stress: No Stress Concern Present (05/28/2021)   [place]    Feeling of Stress : Not at all  Social Connections: Moderately Integrated (  05/28/2021)   Social Connection and Isolation Panel [NHANES]    Frequency of Communication with Friends and Family: More than three times a week    Frequency of Social Gatherings with Friends and Family: More than three times a week    Attends Religious Services: More than 4 times per year    Active Member of [employer] or Organizations: No    Attends Archivist Meetings: Never    Marital Status: Married  Human resources officer Violence: Not At Risk (05/28/2021)   Humiliation, Afraid, Rape, and Kick questionnaire    Fear of Current or Ex-Partner: No    Emotionally Abused: No    Physically Abused: No    Sexually Abused: No    Family History  Problem Relation Age of Onset   Diabetes Mother    Cancer Mother    Cancer Father     Past Surgical History:  Procedure Laterality Date   BREAST SURGERY     CATARACT EXTRACTION     LEFT AND RIGHT HEART CATHETERIZATION WITH CORONARY ANGIOGRAM N/A 10/23/2014   Procedure: LEFT AND RIGHT HEART CATHETERIZATION WITH CORONARY ANGIOGRAM;  Surgeon: Blane Ohara, MD;  Location: [facility] CATH LAB;  Service: Cardiovascular;  Laterality: N/A;   PERMANENT PACEMAKER INSERTION N/A 10/25/2014   STJ dual chamber PPM implanted by Dr Rayann Heman for SSS   TONSILLECTOMY AND ADENOIDECTOMY     TOTAL KNEE ARTHROPLASTY      ROS: Review of Systems Negative except as stated above  PHYSICAL EXAM: There were no vitals taken for this  visit.  Physical Exam  {female adult master:[id number]} {female adult master:[id number]}     Latest Ref Rng & Units 03/01/2022   11:51 AM 09/14/2021   11:27 AM 07/20/2021    8:43 AM  CMP  Glucose 70 - 99 mg/dL 159  155    BUN 8 - 27 mg/dL 20  22    Creatinine 0.57 - 1.00 mg/dL 0.84  0.74    Sodium 134 - 144 mmol/L 143  141    Potassium 3.5 - 5.2 mmol/L 4.7  4.7  4.8   Chloride 96 - 106 mmol/L 105  101    CO2 20 - 29 mmol/L 20  25    Calcium 8.7 - 10.3 mg/dL 9.4  9.7     Lipid Panel     Component Value Date/Time   CHOL 194 11/30/2021 0937   TRIG 122 11/30/2021 0937   HDL 59 11/30/2021 0937   CHOLHDL 3.3 11/30/2021 0937   CHOLHDL 2.5 06/02/2018 0950   VLDL 15 03/28/2017 0907   LDLCALC 113 (H) 11/30/2021 0937   LDLCALC 86 06/02/2018 0950    CBC    Component Value Date/Time   WBC 5.5 08/18/2020 0915   WBC 6.1 06/02/2018 0950   RBC 4.52 08/18/2020 0915   RBC 4.27 06/02/2018 0950   HGB 12.1 08/18/2020 0915   HCT 37.6 08/18/2020 0915   PLT 309 08/18/2020 0915   MCV 83 08/18/2020 0915   MCH 26.8 08/18/2020 0915   MCH 26.7 (L) 06/02/2018 0950   MCHC 32.2 08/18/2020 0915   MCHC 31.9 (L) 06/02/2018 0950   RDW 15.6 (H) 08/18/2020 0915   LYMPHSABS 1.7 08/18/2020 0915   MONOABS 684 03/28/2017 0907   EOSABS 0.1 08/18/2020 0915   BASOSABS 0.1 08/18/2020 0915    ASSESSMENT AND PLAN:  There are no diagnoses linked to this encounter.   Patient was given the opportunity to ask questions.  Patient verbalized understanding of the plan  and was able to repeat key elements of the plan. Patient was given clear instructions to go to Emergency Department or return to medical center if symptoms don't improve, worsen, or new problems develop.The patient verbalized understanding.   No orders of the defined types were placed in this encounter.    Requested Prescriptions    No prescriptions requested or ordered in this encounter    No follow-ups on file.  Camillia Herter, NP

## 2022-07-19 NOTE — Progress Notes (Signed)
Cardiology Office Note:    Date:  07/19/2022   ID:  Tracey Todd, DOB 1935/02/15, MRN [id number]  PCP:  Camillia Herter, NP   [place] [facility] Cardiologist:  Werner Lean, MD Cardiology APP:  Patsey Berthold, NP (Inactive)  Electrophysiologist:  [name], MD     Referring MD: Camillia Herter, NP   CC: DOD SOB  History of Present Illness:    Tracey Todd is a 87 y.o. female with a hx of long standing persistent  AFL and HFpEF, HTN and HLD.  Mild CAD.    Patient notes that she is doing poorly.   Since last visit notes that she has had new LE edema that she did not have in August. . There are no interval hospital/ED visit.    Notes headache.  Notes rare CP.  Notes SOB and rest and DOE when on her walker.  Notes PND and orthopnea..  New weight gain.  No palpitations or syncope.  Cannot tell when she is in A fib.   Past Medical History:  Diagnosis Date   Atrial flutter ([place])    a. mentioned in 2016 admission.   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 minimal by cath 2016.   Nodule of chest wall    Normal coronary arteries and LVF 10/23/14 10/24/2014   Pacemaker implanted 10/25/14- [employer] 10/26/2014   Paroxysmal atrial fibrillation (HCC)    a. identified on device interrogation, burden low   Pulmonary hypertension (HCC)    Sick sinus syndrome ([place])    a. s/p STJ dual chamber PPM    Type II or unspecified type diabetes mellitus without mention of complication, not stated as uncontrolled    Vitamin D deficiency     Past Surgical History:  Procedure Laterality Date   BREAST SURGERY     CATARACT EXTRACTION     LEFT AND RIGHT HEART CATHETERIZATION WITH CORONARY ANGIOGRAM N/A 10/23/2014   Procedure: LEFT AND RIGHT HEART CATHETERIZATION WITH CORONARY ANGIOGRAM;  Surgeon: Blane Ohara, MD;  Location: [facility] CATH LAB;  Service: Cardiovascular;  Laterality: N/A;   PERMANENT PACEMAKER INSERTION N/A 10/25/2014   STJ dual chamber PPM implanted by Dr Rayann Heman for SSS   TONSILLECTOMY AND ADENOIDECTOMY     TOTAL KNEE ARTHROPLASTY      Current Medications: Current Meds  Medication Sig   amLODipine (NORVASC) 5 MG tablet Take 1 tablet by mouth once daily   atorvastatin (LIPITOR) 40 MG tablet Take 1 tablet (40 mg total) by mouth daily.   bisoprolol (ZEBETA) 10 MG tablet Take 1 tablet by mouth once daily for blood pressure   Blood Glucose Monitoring Suppl (ONETOUCH VERIO) w/Device KIT Use to check fasting blood sugar daily. Dx: E11.59   calcium carbonate (OS-CAL) 600 MG TABS tablet Take 600 mg by mouth daily with breakfast.   cetirizine (ZYRTEC) 10 MG tablet Take 10 mg by mouth every other day.    Cholecalciferol (VITAMIN D) 2000 UNITS CAPS Take 6,000 Units by mouth daily.    furosemide (LASIX) 40 MG tablet Take 1 tablet (40 mg total) by mouth 2 (two) times daily.   gabapentin (NEURONTIN) 300 MG capsule Take 1 capsule (300 mg total) by mouth 2 (two) times daily.   glucose blood (  ONETOUCH VERIO) test strip USE TO CHECK FASTING BLOOD SUGAR DAILY   Lancets (ONETOUCH DELICA PLUS [id number]) MISC USE TO CHECK FASTING BLOOD SUGAR DAILY   Magnesium 500 MG TABS Take 500 mg by mouth 4 (four) times daily.    metFORMIN (GLUCOPHAGE-XR) 500 MG 24 hr tablet Take 1 tablet (500 mg total) by mouth 2 (two) times daily with a meal.   spironolactone (ALDACTONE) 25 MG tablet Take 0.5 tablets (12.5 mg total) by mouth daily.   warfarin (COUMADIN) 5 MG tablet Take 1.5 tablets daily except 1 tablet on Mondays, Wednesday and Fridays daily or as [place]   [DISCONTINUED] furosemide (LASIX) 40 MG tablet Take 1 tablet by mouth once daily     Allergies:   Penicillins, Quinapril, Feldene [piroxicam], Calcium-containing compounds, Celebrex [celecoxib], and Daypro [oxaprozin]   Social  History   Socioeconomic History   Marital status: Married    Spouse name: Not on file   Number of children: Not on file   Years of education: Not on file   Highest education level: Not on file  Occupational History   Not on file  Tobacco Use   Smoking status: Former    Types: Cigarettes    Quit date: 12/06/1963    Years since quitting: 58.6    Passive exposure: Never   Smokeless tobacco: Never  Vaping Use   Vaping Use: Never used  Substance and Sexual Activity   Alcohol use: No   Drug use: Never   Sexual activity: Not Currently  Other Topics Concern   Not on file  Social History Narrative   Left Handed    Lives in a one story home    Social Determinants of Health   Financial Resource Strain: Low Risk  (05/28/2021)   Overall Financial Resource Strain (CARDIA)    Difficulty of Paying Living Expenses: Not very hard  Food Insecurity: No Food Insecurity (05/28/2021)   Hunger Vital Sign    Worried About Running Out of Food in the Last Year: Never true    [place] in the Last Year: Never true  Transportation Needs: No Transportation Needs (05/28/2021)   PRAPARE - Hydrologist (Medical): No    Lack of Transportation (Non-Medical): No  Physical Activity: Inactive (05/28/2021)   Exercise Vital Sign    Days of Exercise per Week: 0 days    Minutes of Exercise per Session: 0 min  Stress: No Stress Concern Present (05/28/2021)   [place]    Feeling of Stress : Not at all  Social Connections: Moderately Integrated (05/28/2021)   Social Connection and Isolation Panel [NHANES]    Frequency of Communication with Friends and Family: More than three times a week    Frequency of Social Gatherings with Friends and Family: More than three times a week    Attends Religious Services: More than 4 times per year    Active Member of [employer] or Organizations: No    Attends Archivist  Meetings: Never    Marital Status: Married     Family History: The patient's family history includes Cancer in her father and mother; Diabetes in her mother.  ROS:   Please see the history of present illness.     All other systems reviewed and are negative.  EKGs/Labs/Other Studies Reviewed:    The following studies were reviewed today:   EKG:    Cardiac Studies & Procedures  ECHOCARDIOGRAM  ECHOCARDIOGRAM COMPLETE 08/28/2018  Narrative *[facility]* [address], [place] [address] [phone]  ------------------------------------------------------------------- Transthoracic Echocardiography  Patient:    Tracey Todd, Tracey Todd MR #:       [id number] Study Date: 08/28/2018 Gender:     F Age:        6 Height:     148.6 cm Weight:     82.2 kg BSA:        1.89 m^2 Pt. Status: Room:  ATTENDING    Kirk Ruths SONOGRAPHER  Diamond Nickel ORDERING     Dunn, Dayna N REFERRING    Dunn, Dayna N PERFORMING   [facility], Outpatient  cc:  ------------------------------------------------------------------- LV EF: 50% -   55%  ------------------------------------------------------------------- Indications:      I48.91 Atrial fibrillation.  ------------------------------------------------------------------- History:   PMH:  Pacemaker. Pulmonary hypertension. Minimal CAD. Atrial flutter.  Congestive heart failure.  Risk factors: Hypertension. Diabetes mellitus. Dyslipidemia.  ------------------------------------------------------------------- Study Conclusions  - Left ventricle: The cavity size was normal. There was mild focal basal hypertrophy of the septum. Systolic function was normal. The estimated ejection fraction was in the range of 50% to 55%. Wall motion was normal; there were no regional wall motion abnormalities. Doppler parameters are consistent with abnormal left ventricular relaxation (grade 1 diastolic dysfunction). - Ventricular  septum: Septal motion showed abnormal function and dyssynergy. - Aortic valve: There was trivial regurgitation. - Mitral valve: There was mild regurgitation. - Pulmonary arteries: Systolic pressure was mildly increased. PA peak pressure: 45 mm Hg (S).  Impressions:  - Normal LV systolic function; mild diastolic dysfunction; trace AI; mild MR and TR; mild pulmonary hypertension.  ------------------------------------------------------------------- Study data:  Comparison was made to the study of 10/22/2014.  Study status:  Routine.  Procedure:  The patient reported no pain pre or post test. Transthoracic echocardiography. Image quality was adequate. The study was technically difficult, as a result of restricted patient mobility.  Study completion:  There were no complications.          Transthoracic echocardiography.  M-mode, complete 2D, spectral Doppler, and color Doppler.  Birthdate: Patient birthdate: 19-Jan-1935.  Age:  Patient is 87 yr old.  Sex: Gender: female.    BMI: 37.2 kg/m^2.  Blood pressure:     149/81 Patient status:  Outpatient.  Study date:  Study date: 08/28/2018. Study time: 07:24 AM.  Location:  [facility]  -------------------------------------------------------------------  ------------------------------------------------------------------- Left ventricle:  The cavity size was normal. There was mild focal basal hypertrophy of the septum. Systolic function was normal. The estimated ejection fraction was in the range of 50% to 55%. Wall motion was normal; there were no regional wall motion abnormalities. Doppler parameters are consistent with abnormal left ventricular relaxation (grade 1 diastolic dysfunction).  ------------------------------------------------------------------- Aortic valve:   Trileaflet; mildly calcified leaflets. Mobility was not restricted.  Doppler:  Transvalvular velocity was within the normal range. There was no stenosis. There was  trivial regurgitation.  ------------------------------------------------------------------- Aorta:  Aortic root: The aortic root was normal in size.  ------------------------------------------------------------------- Mitral valve:   Structurally normal valve.   Mobility was not restricted.  Doppler:  Transvalvular velocity was within the normal range. There was no evidence for stenosis. There was mild regurgitation.    Indexed valve area by pressure half-time: 1.1 cm^2/m^2.  ------------------------------------------------------------------- Left atrium:  The atrium was normal in size.  ------------------------------------------------------------------- Right ventricle:  The cavity size was normal. Pacer wire or catheter noted in right ventricle. Systolic function was normal.  -------------------------------------------------------------------  Ventricular septum:   Septal motion showed abnormal function and dyssynergy.  ------------------------------------------------------------------- Pulmonic valve:    Doppler:  Transvalvular velocity was within the normal range. There was no evidence for stenosis. There was trivial regurgitation.  ------------------------------------------------------------------- Tricuspid valve:   Structurally normal valve.    Doppler: Transvalvular velocity was within the normal range. There was mild regurgitation.  ------------------------------------------------------------------- Pulmonary artery:   Systolic pressure was mildly increased.  ------------------------------------------------------------------- Right atrium:  The atrium was normal in size. Pacer wire or catheter noted in right atrium.  ------------------------------------------------------------------- Pericardium:  There was no pericardial effusion.  ------------------------------------------------------------------- Systemic veins: Inferior vena cava: The vessel was normal in  size.  ------------------------------------------------------------------- Measurements  Left ventricle                         Value          Reference LV ID, ED, PLAX chordal        (L)     39    mm       43 - 52 LV ID, ES, PLAX chordal                24    mm       23 - 38 LV fx shortening, PLAX chordal         38    %        >=29 LV PW thickness, ED                    9.2   mm       ---------- IVS/LV PW ratio, ED                    1.3            <=1.3 Stroke volume, 2D                      49    ml       ---------- Stroke volume/bsa, 2D                  26    ml/m^2   ---------- LV e&', lateral                         7.04  cm/s     ---------- LV E/e&', lateral                       8.72           ---------- LV e&', medial                          7.11  cm/s     ---------- LV E/e&', medial                        8.64           ---------- LV e&', average                         7.08  cm/s     ---------- LV E/e&', average                       8.68           ----------  Ventricular  septum                     Value          Reference IVS thickness, ED                      11.96 mm       ----------  LVOT                                   Value          Reference LVOT ID, S                             20    mm       ---------- LVOT area                              3.14  cm^2     ---------- LVOT peak velocity, S                  73.7  cm/s     ---------- LVOT mean velocity, S                  48.3  cm/s     ---------- LVOT VTI, S                            15.5  cm       ----------  Aorta                                  Value          Reference Aortic root ID, ED                     34    mm       ----------  Left atrium                            Value          Reference LA ID, A-P, ES                         28    mm       ---------- LA ID/bsa, A-P                         1.48  cm/m^2   <=2.2 LA volume, S                           40.6  ml       ---------- LA volume/bsa, S                        21.5  ml/m^2   ---------- LA volume, ES, 1-p A4C                 48.1  ml       ---------- LA volume/bsa, ES, 1-p A4C  25.5  ml/m^2   ---------- LA volume, ES, 1-p A2C                 31.2  ml       ---------- LA volume/bsa, ES, 1-p A2C             16.5  ml/m^2   ----------  Mitral valve                           Value          Reference Mitral E-wave peak velocity            61.4  cm/s     ---------- Mitral A-wave peak velocity            85.6  cm/s     ---------- Mitral deceleration time       (H)     370   ms       150 - 230 Mitral pressure half-time              108   ms       ---------- Mitral E/A ratio, peak                 0.7            ---------- Mitral valve area/bsa, PHT, DP         1.1   cm^2/m^2 ----------  Pulmonary arteries                     Value          Reference PA pressure, S, DP             (H)     45    mm Hg    <=30  Tricuspid valve                        Value          Reference Tricuspid regurg peak velocity         323   cm/s     ---------- Tricuspid peak RV-RA gradient          42    mm Hg    ----------  Right atrium                           Value          Reference RA ID, S-I, ES, A4C            (H)     54.2  mm       34 - 49 RA area, ES, A4C                       14    cm^2     8.3 - 19.5 RA volume, ES, A/L                     29.8  ml       ---------- RA volume/bsa, ES, A/L                 15.8  ml/m^2   ----------  Systemic veins                         Value  Reference Estimated CVP                          3     mm Hg    ----------  Right ventricle                        Value          Reference RV pressure, S, DP             (H)     45    mm Hg    <=30 RV s&', lateral, S                      10.8  cm/s     ----------  Pulmonic valve                         Value          Reference Pulmonic regurg velocity, ED           96.4  cm/s     ----------  Legend: (L)  and  (H)  mark values outside specified  reference range.  ------------------------------------------------------------------- Prepared and Electronically Authenticated by  Kirk Ruths 2019-12-30T15:52:29              Recent Labs: 03/01/2022: BUN 20; Creatinine, Ser 0.84; Potassium 4.7; Sodium 143  Recent Lipid Panel    Component Value Date/Time   CHOL 194 11/30/2021 0937   TRIG 122 11/30/2021 0937   HDL 59 11/30/2021 0937   CHOLHDL 3.3 11/30/2021 0937   CHOLHDL 2.5 06/02/2018 0950   VLDL 15 03/28/2017 0907   LDLCALC 113 (H) 11/30/2021 0937   LDLCALC 86 06/02/2018 0950     Physical Exam:    VS:  BP 124/62   Pulse 67   Ht _0  (1.473 m)   Wt 200 lb 3.2 oz (90.8 kg)   SpO2 97%   BMI 41.84 kg/m     Wt Readings from Last 3 Encounters:  07/19/22 200 lb 3.2 oz (90.8 kg)  06/30/22 199 lb (90.3 kg)  04/16/22 201 lb (91.2 kg)    GEN: Elderly female morbid obesity HEENT: Normal LYMPHATICS: No lymphadenopathy CARDIAC: RRR, no murmurs, rubs, gallops RESPIRATORY:  Clear to auscultation without rales, wheezing or rhonchi  ABDOMEN: Soft, non-tender, non-distended MUSCULOSKELETAL:  +2 bilateral edema; No deformity  SKIN: Warm and dry NEUROLOGIC:  Alert and oriented x 3 PSYCHIATRIC:  Normal affect   ASSESSMENT:    1. Heart failure, type unknown ([place])   2. PAF-fib and flutter   3. Coronary artery disease of native artery of native heart with stable angina pectoris (HCC)    PLAN:    HFrEF PAFL Mild CAD - volume up - increase lasix to 40 mg PO BID - add aldactone 12.5 mg PO daily   - BMP and BNP on two weeks - add Farxiga at that time - get echo  6 weeks f/u me or APP   Medication Adjustments/Labs and Tests Ordered: Current medicines are reviewed at length with the patient today.  Concerns regarding medicines are outlined above.  Orders Placed This Encounter  Procedures   Basic metabolic panel   Pro b natriuretic peptide (BNP)   ECHOCARDIOGRAM COMPLETE   Meds ordered this encounter   Medications   furosemide (LASIX) 40 MG tablet    Sig: Take 1 tablet (40 mg  total) by mouth 2 (two) times daily.    Dispense:  180 tablet    Refill:  3   spironolactone (ALDACTONE) 25 MG tablet    Sig: Take 0.5 tablets (12.5 mg total) by mouth daily.    Dispense:  45 tablet    Refill:  3    Patient Instructions  Medication Instructions:  Your physician has recommended you make the following change in your medication:  START: spironolactone (Aldactone) 12.5 mg (1/2 pill) by mouth once daily  INCREASE: furosemide (Lasix) to 40 mg by mouth twice daily  *If you need a refill on your cardiac medications before your next appointment, please call your pharmacy*   Lab Work: IN 2 WEEKS: BNP, BMP  If you have labs (blood work) drawn today and your tests are completely normal, you will receive your results only by: [place] (if you have MyChart) OR A paper copy in the mail If you have any lab test that is abnormal or we need to change your treatment, we will call you to review the results.   Testing/Procedures: Your physician has requested that you have an echocardiogram. Echocardiography is a painless test that uses sound waves to create images of your heart. It provides your doctor with information about the size and shape of your heart and how well your heart's chambers and valves are working. This procedure takes approximately one hour. There are no restrictions for this procedure. Please do NOT wear cologne, perfume, aftershave, or lotions (deodorant is allowed). Please arrive 15 minutes prior to your appointment time.    Follow-Up: At [facility], you and your health needs are our priority.  As part of our continuing mission to provide you with exceptional heart care, we have created designated Provider Care Teams.  These Care Teams include your primary Cardiologist (physician) and Advanced Practice Providers (APPs -  Physician Assistants and Nurse Practitioners)  who all work together to provide you with the care you need, when you need it.  We recommend signing up for the patient portal called "MyChart".  Sign up information is provided on this After Visit Summary.  MyChart is used to connect with patients for Virtual Visits (Telemedicine).  Patients are able to view lab/test results, encounter notes, upcoming appointments, etc.  Non-urgent messages can be sent to your provider as well.   To learn more about what you can do with MyChart, go to [email or web address].    Your next appointment:   6-8 week(s)  The format for your next appointment:   In Person  Provider:   Werner Lean, MD  or Nicholes Rough, PA-C, Ambrose Pancoast, NP, or Christen Bame, NP       Important Information About Sugar         Signed, Werner Lean, MD  07/19/2022 6:01 PM    [place]

## 2022-07-19 NOTE — Patient Instructions (Signed)
Medication Instructions:  Your physician has recommended you make the following change in your medication:  START: spironolactone (Aldactone) 12.5 mg (1/2 pill) by mouth once daily  INCREASE: furosemide (Lasix) to 40 mg by mouth twice daily  *If you need a refill on your cardiac medications before your next appointment, please call your pharmacy*   Lab Work: IN 2 WEEKS: BNP, BMP  If you have labs (blood work) drawn today and your tests are completely normal, you will receive your results only by: MyChart Message (if you have MyChart) OR A paper copy in the mail If you have any lab test that is abnormal or we need to change your treatment, we will call you to review the results.   Testing/Procedures: Your physician has requested that you have an echocardiogram. Echocardiography is a painless test that uses sound waves to create images of your heart. It provides your doctor with information about the size and shape of your heart and how well your heart's chambers and valves are working. This procedure takes approximately one hour. There are no restrictions for this procedure. Please do NOT wear cologne, perfume, aftershave, or lotions (deodorant is allowed). Please arrive 15 minutes prior to your appointment time.    Follow-Up: At [facility], you and your health needs are our priority.  As part of our continuing mission to provide you with exceptional heart care, we have created designated Provider Care Teams.  These Care Teams include your primary Cardiologist (physician) and Advanced Practice Providers (APPs -  Physician Assistants and Nurse Practitioners) who all work together to provide you with the care you need, when you need it.  We recommend signing up for the patient portal called "MyChart".  Sign up information is provided on this After Visit Summary.  MyChart is used to connect with patients for Virtual Visits (Telemedicine).  Patients are able to view lab/test  results, encounter notes, upcoming appointments, etc.  Non-urgent messages can be sent to your provider as well.   To learn more about what you can do with MyChart, go to [email or web address].    Your next appointment:   6-8 week(s)  The format for your next appointment:   In Person  Provider:   Christell Constant, MD  or Jari Favre, PA-C, Robin Searing, NP, or Eligha Bridegroom, NP       Important Information About Sugar

## 2022-07-20 ENCOUNTER — Ambulatory Visit: Payer: Medicare Other | Admitting: Family

## 2022-07-21 DIAGNOSIS — H35362 Drusen (degenerative) of macula, left eye: Secondary | ICD-10-CM | POA: Diagnosis not present

## 2022-07-21 DIAGNOSIS — H40023 Open angle with borderline findings, high risk, bilateral: Secondary | ICD-10-CM | POA: Diagnosis not present

## 2022-07-21 DIAGNOSIS — H04123 Dry eye syndrome of bilateral lacrimal glands: Secondary | ICD-10-CM | POA: Diagnosis not present

## 2022-07-21 DIAGNOSIS — E119 Type 2 diabetes mellitus without complications: Secondary | ICD-10-CM | POA: Diagnosis not present

## 2022-07-27 ENCOUNTER — Ambulatory Visit: Payer: Medicare Other | Attending: Cardiology | Admitting: *Deleted

## 2022-07-27 DIAGNOSIS — I480 Paroxysmal atrial fibrillation: Secondary | ICD-10-CM | POA: Diagnosis not present

## 2022-07-27 DIAGNOSIS — Z7901 Long term (current) use of anticoagulants: Secondary | ICD-10-CM

## 2022-07-27 LAB — POCT INR: INR: 3.1 — AB (ref 2.0–3.0)

## 2022-07-27 NOTE — Patient Instructions (Signed)
Description   Today take 1 tablet then start taking 1 tablet daily except 1.5 tablets on Sundays, Tuesday, and Thursdays. Continue eating 2 serving of greens per week plus your one day of brussels sprouts. Recheck INR in 3 weeks. [facility] [phone]

## 2022-07-29 ENCOUNTER — Encounter: Payer: Medicare Other | Admitting: Family

## 2022-08-06 ENCOUNTER — Ambulatory Visit (INDEPENDENT_AMBULATORY_CARE_PROVIDER_SITE_OTHER): Payer: Medicare Other | Admitting: Family

## 2022-08-06 ENCOUNTER — Other Ambulatory Visit: Payer: Medicare Other

## 2022-08-06 ENCOUNTER — Ambulatory Visit (HOSPITAL_COMMUNITY): Payer: Medicare Other | Attending: Internal Medicine

## 2022-08-06 VITALS — BP 129/76 | HR 61 | Temp 98.3°F | Resp 16 | Ht <= 58 in | Wt 200.0 lb

## 2022-08-06 DIAGNOSIS — I509 Heart failure, unspecified: Secondary | ICD-10-CM | POA: Diagnosis not present

## 2022-08-06 DIAGNOSIS — E1141 Type 2 diabetes mellitus with diabetic mononeuropathy: Secondary | ICD-10-CM

## 2022-08-06 DIAGNOSIS — H6122 Impacted cerumen, left ear: Secondary | ICD-10-CM

## 2022-08-06 DIAGNOSIS — Z130 Encounter for screening for diseases of the blood and blood-forming organs and certain disorders involving the immune mechanism: Secondary | ICD-10-CM | POA: Diagnosis not present

## 2022-08-06 DIAGNOSIS — Z13228 Encounter for screening for other metabolic disorders: Secondary | ICD-10-CM

## 2022-08-06 DIAGNOSIS — Z0000 Encounter for general adult medical examination without abnormal findings: Secondary | ICD-10-CM

## 2022-08-06 DIAGNOSIS — Z0001 Encounter for general adult medical examination with abnormal findings: Secondary | ICD-10-CM | POA: Diagnosis not present

## 2022-08-06 DIAGNOSIS — Z1329 Encounter for screening for other suspected endocrine disorder: Secondary | ICD-10-CM

## 2022-08-06 LAB — ECHOCARDIOGRAM COMPLETE
Height: 57.008 in
S' Lateral: 2.3 cm
Weight: 3200 oz

## 2022-08-06 MED ORDER — ONETOUCH DELICA PLUS LANCET33G MISC: 2 refills | Status: DC

## 2022-08-06 MED ORDER — ONETOUCH VERIO VI STRP: ORAL_STRIP | 11 refills | Status: DC

## 2022-08-06 NOTE — Progress Notes (Signed)
Subjective:   Tracey Todd is a 87 y.o. female who presents for Medicare Annual (Subsequent) preventive examination.  Review of Systems    - Left ear popping sound. No additional symptoms.  - Needs refills on diabetic test strips and lancets.  Objective:    Today's Vitals   08/06/22 1050  BP: 129/76  Pulse: 61  Resp: 16  Temp: 98.3 F (36.8 C)  SpO2: 90%  Weight: 200 lb (90.7 kg)  Height: 4' 9.01" (1.448 m)  PainSc: 0-No pain   Body mass index is 43.27 kg/m.   Physical Exam HENT:     Head: Normocephalic and atraumatic.     Right Ear: Tympanic membrane, ear canal and external ear normal.     Left Ear: Tympanic membrane, ear canal and external ear normal.     Nose: Nose normal.     Mouth/Throat:     Mouth: Mucous membranes are moist.     Pharynx: Oropharynx is clear.  Eyes:     Extraocular Movements: Extraocular movements intact.     Conjunctiva/sclera: Conjunctivae normal.     Pupils: Pupils are equal, round, and reactive to light.  Cardiovascular:     Rate and Rhythm: Normal rate and regular rhythm.     Pulses: Normal pulses.     Heart sounds: Normal heart sounds.  Pulmonary:     Effort: Pulmonary effort is normal.     Breath sounds: Normal breath sounds.  Chest:     Comments: Patient declined.  Abdominal:     General: Bowel sounds are normal.     Palpations: Abdomen is soft.  Genitourinary:    Comments: Patient declined.  Musculoskeletal:        General: Normal range of motion.     Cervical back: Normal range of motion and neck supple.  Skin:    General: Skin is warm and dry.     Capillary Refill: Capillary refill takes less than 2 seconds.  Neurological:     General: No focal deficit present.     Mental Status: She is alert and oriented to person, place, and time.  Psychiatric:        Mood and Affect: Mood normal.        Behavior: Behavior normal.       08/06/2022   10:54 AM 11/20/2021    9:39 AM 05/28/2021    7:06 PM 07/20/2017    9:07 AM  06/08/2017    6:01 PM 06/02/2016    9:10 AM 09/22/2015    9:31 AM  Advanced Directives  Does Patient Have a Medical Advance Directive? _0  Yes No  Type of Visual merchandiser of [place];Living will   Does patient want to make changes to medical advance directive?      No - Patient declined   Copy of [place] in Chart?      No - copy requested   Would patient like information on creating a medical advance directive? Yes (Inpatient - patient defers creating a medical advance directive at this time - Information given)  No - Patient declined No - Patient declined No - Patient declined      Current Medications (verified) Outpatient Encounter Medications as of 08/06/2022  Medication Sig   amLODipine (NORVASC) 5 MG tablet Take 1 tablet by mouth once daily   atorvastatin (LIPITOR) 40 MG tablet Take 1 tablet (40 mg total) by mouth daily.   bisoprolol (ZEBETA)  10 MG tablet Take 1 tablet by mouth once daily for blood pressure   Blood Glucose Monitoring Suppl (ONETOUCH VERIO) w/Device KIT Use to check fasting blood sugar daily. Dx: E11.59   calcium carbonate (OS-CAL) 600 MG TABS tablet Take 600 mg by mouth daily with breakfast.   cetirizine (ZYRTEC) 10 MG tablet Take 10 mg by mouth every other day.    Cholecalciferol (VITAMIN D) 2000 UNITS CAPS Take 6,000 Units by mouth daily.    furosemide (LASIX) 40 MG tablet Take 1 tablet (40 mg total) by mouth 2 (two) times daily.   gabapentin (NEURONTIN) 300 MG capsule Take 1 capsule (300 mg total) by mouth 2 (two) times daily.   glucose blood (ONETOUCH VERIO) test strip USE TO CHECK FASTING BLOOD SUGAR DAILY   Lancets (ONETOUCH DELICA PLUS [id number]) MISC USE TO CHECK FASTING BLOOD SUGAR DAILY   Magnesium 500 MG TABS Take 500 mg by mouth 4 (four) times daily.    metFORMIN (GLUCOPHAGE-XR) 500 MG 24 hr tablet Take 1 tablet (500 mg total) by mouth 2 (two) times daily with a meal.   potassium chloride SA (KLOR-CON  M) 20 MEQ tablet Take 1 tablet (20 mEq total) by mouth daily. (Patient not taking: Reported on 07/19/2022)   spironolactone (ALDACTONE) 25 MG tablet Take 0.5 tablets (12.5 mg total) by mouth daily.   warfarin (COUMADIN) 5 MG tablet Take 1.5 tablets daily except 1 tablet on Mondays, Wednesday and Fridays daily or as [place]   [DISCONTINUED] glucose blood (ONETOUCH VERIO) test strip USE TO CHECK FASTING BLOOD SUGAR DAILY   [DISCONTINUED] Lancets (ONETOUCH DELICA PLUS [id number]) MISC USE TO CHECK FASTING BLOOD SUGAR DAILY   No facility-administered encounter medications on file as of 08/06/2022.    Allergies (verified) Penicillins, Quinapril, Feldene [piroxicam], Calcium-containing compounds, Celebrex [celecoxib], and Daypro [oxaprozin]   History: Past Medical History:  Diagnosis Date   Atrial flutter ([place])    a. mentioned in 2016 admission.   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 minimal by cath 2016.   Nodule of chest wall    Normal coronary arteries and LVF 10/23/14 10/24/2014   Pacemaker implanted 10/25/14- St Jude 10/26/2014   Paroxysmal atrial fibrillation (HCC)    a. identified on device interrogation, burden low   Pulmonary hypertension (HCC)    Sick sinus syndrome ([place])    a. s/p STJ dual chamber PPM    Type II or unspecified type diabetes mellitus without mention of complication, not stated as uncontrolled    Vitamin D deficiency    Past Surgical History:  Procedure Laterality Date   BREAST SURGERY     CATARACT EXTRACTION     LEFT AND RIGHT HEART CATHETERIZATION WITH CORONARY ANGIOGRAM N/A 10/23/2014   Procedure: LEFT AND RIGHT HEART CATHETERIZATION WITH CORONARY ANGIOGRAM;  Surgeon: Blane Ohara, MD;  Location: [facility] CATH LAB;  Service: Cardiovascular;  Laterality: N/A;    PERMANENT PACEMAKER INSERTION N/A 10/25/2014   STJ dual chamber PPM implanted by Dr Rayann Heman for SSS   TONSILLECTOMY AND ADENOIDECTOMY     TOTAL KNEE ARTHROPLASTY     Family History  Problem Relation Age of Onset   Diabetes Mother    Cancer Mother    Cancer Father    Social History  Socioeconomic History   Marital status: Married    Spouse name: Not on file   Number of children: Not on file   Years of education: Not on file   Highest education level: Not on file  Occupational History   Not on file  Tobacco Use   Smoking status: Former    Types: Cigarettes    Quit date: 12/06/1963    Years since quitting: 58.7    Passive exposure: Never   Smokeless tobacco: Never  Vaping Use   Vaping Use: Never used  Substance and Sexual Activity   Alcohol use: No   Drug use: Never   Sexual activity: Not Currently  Other Topics Concern   Not on file  Social History Narrative   Left Handed    Lives in a one story home    Social Determinants of Health   Financial Resource Strain: Low Risk  (05/28/2021)   Overall Financial Resource Strain (CARDIA)    Difficulty of Paying Living Expenses: Not very hard  Food Insecurity: No Food Insecurity (05/28/2021)   Hunger Vital Sign    Worried About Running Out of Food in the Last Year: Never true    [place] in the Last Year: Never true  Transportation Needs: No Transportation Needs (05/28/2021)   PRAPARE - Hydrologist (Medical): No    Lack of Transportation (Non-Medical): No  Physical Activity: Inactive (05/28/2021)   Exercise Vital Sign    Days of Exercise per Week: 0 days    Minutes of Exercise per Session: 0 min  Stress: No Stress Concern Present (05/28/2021)   [place]    Feeling of Stress : Not at all  Social Connections: Moderately Integrated (05/28/2021)   Social Connection and Isolation Panel [NHANES]    Frequency of Communication with  Friends and Family: More than three times a week    Frequency of Social Gatherings with Friends and Family: More than three times a week    Attends Religious Services: More than 4 times per year    Active Member of [employer] or Organizations: No    Attends Archivist Meetings: Never    Marital Status: Married    Tobacco Counseling Never smoked.   Clinical Intake:  Pre-visit preparation completed: Yes  Pain : 0-10 Pain Score: 0-No pain  Diabetes: Yes CBG done?: No Did pt. bring in CBG monitor from home?: No  How often do you need to have someone help you when you read instructions, pamphlets, or other written materials from your doctor or pharmacy?: 1 - Never  Diabetic? Yes  Interpreter Needed?: No  Activities of Daily Living     No data to display          Patient Care Team: Camillia Herter, NP as PCP - General (Nurse Practitioner) [name], MD as PCP - Electrophysiology (Cardiology) Werner Lean, MD as PCP - Cardiology (Cardiology) Monna Fam, MD as Consulting Physician (Ophthalmology) Stanford Breed Denice Bors, MD as Consulting Physician (Cardiology) Josue Hector, MD as Consulting Physician (Cardiology) Laurence Spates, MD (Inactive) as Consulting Physician (Gastroenterology) Patsey Berthold, NP (Inactive) as Nurse Practitioner (Cardiology) Alda Berthold, DO as Consulting Physician (Neurology) Vickie Epley, MD as Consulting Physician (Cardiology)  Indicate any recent Medical Services you may have received from other than [facility] providers in the past year (date may be approximate).   Assessment:   This is a routine wellness  examination for [facility].  Hearing/Vision screen - Established with eye doctor.  - Hearing normal.  Dietary issues and exercise activities discussed: yes   Goals Addressed:  Would like to lose 10 pounds.   Depression Screen    06/30/2022   10:28 AM 03/01/2022    9:07 AM 02/01/2022    3:06 PM 09/14/2021    8:33  AM 07/30/2021    9:41 AM 06/08/2021    9:11 AM 05/28/2021    6:55 PM  PHQ 2/9 Scores  PHQ - 2 Score 0 0 0 0 0 0 0    Fall Risk    11/20/2021    9:39 AM 11/06/2021    9:01 AM 05/28/2021    7:07 PM 11/26/2020    3:03 PM 11/14/2019   11:00 AM  [place] in the past year? 0 0 0 0 0  Number falls in past yr: 0 0 0 0 0  Injury with Fall? 0 0 0 0 0  Risk for fall due to :   No Fall Risks No Fall Risks   Follow up   Falls evaluation completed Falls evaluation completed     FALL RISK PREVENTION PERTAINING TO THE HOME: Any stairs in or around the home? No  If so, are there any without handrails?  N/A Home free of loose throw rugs in walkways, pet beds, electrical cords, etc? Yes  Adequate lighting in your home to reduce risk of falls? Yes   ASSISTIVE DEVICES UTILIZED TO PREVENT FALLS: Life alert? No  Use of a cane, walker or w/c? Yes  Grab bars in the bathroom? Yes  Shower chair or bench in shower? Yes  Elevated toilet seat or a handicapped toilet? No   TIMED UP AND GO: Was the test performed? Yes .  Length of time to ambulate 10 feet: 10 sec.   Gait slow and steady with assistive device  Cognitive Function:    06/08/2021    9:10 AM  MMSE - Mini Mental State Exam  Orientation to time 4  Orientation to Place 5  Registration 3  Attention/ Calculation 4  Recall 3  Language- name 2 objects 2  Language- repeat 1  Language- follow 3 step command 3  Language- read & follow direction 1  Write a sentence 1  Copy design 1  Total score 28        05/28/2021    7:10 PM  6CIT Screen  What Year? 0 points  What month? 0 points  What time? 0 points  Count back from 20 0 points  Months in reverse 0 points  Repeat phrase 0 points  Total Score 0 points    Immunizations Immunization History  Administered Date(s) Administered   Fluad Quad(high Dose 65+) 05/28/2022   Influenza Split 05/23/2013   Influenza, High Dose Seasonal PF 05/30/2014, 05/19/2015, 06/02/2016,  05/26/2017   Influenza,inj,Quad PF,6+ Mos 05/16/2019, 05/19/2020   Influenza-Unspecified 06/02/2018, 06/02/2021   PFIZER(Purple Top)SARS-COV-2 Vaccination 10/26/2019, 11/20/2019   Pneumococcal Conjugate-13 08/06/2015, 04/09/2019   Pneumococcal Polysaccharide-23 07/29/2013   Td 08/31/2011   Tdap 11/30/2021    TDAP status: Up to date  Flu Vaccine status: Up to date  Pneumococcal vaccine status: Up to date  Covid-19 vaccine status: Completed vaccines  Qualifies for Shingles Vaccine? Yes   Zostavax completed No   Shingrix Completed?: No.    Education has been provided regarding the importance of this vaccine. Patient has been advised to call insurance company to determine out of pocket  expense if they have not yet received this vaccine. Advised may also receive vaccine at local pharmacy or Health Dept. Verbalized acceptance and understanding.  Screening Tests Health Maintenance  Topic Date Due   COVID-19 Vaccine (3 - Pfizer risk series) 12/18/2019   Medicare Annual Wellness (AWV)  06/08/2022   OPHTHALMOLOGY EXAM  07/14/2022   Zoster Vaccines- Shingrix (1 of 2) 09/30/2022 (Originally 06/01/1954)   FOOT EXAM  08/10/2022   Diabetic kidney evaluation - Urine ACR  12/01/2022   HEMOGLOBIN A1C  12/29/2022   Diabetic kidney evaluation - GFR measurement  03/02/2023   DTaP/Tdap/Td (3 - Td or Tdap) 12/01/2031   Pneumonia Vaccine 15+ Years old  Completed   INFLUENZA VACCINE  Completed   DEXA SCAN  Completed   HPV VACCINES  Aged Out    Health Maintenance  Health Maintenance Due  Topic Date Due   COVID-19 Vaccine (3 - Pfizer risk series) 12/18/2019   Medicare Annual Wellness (AWV)  06/08/2022   OPHTHALMOLOGY EXAM  07/14/2022    Colorectal cancer screening: No longer required.   Mammogram appointment scheduled 08/27/2022.  Bone Density status: Completed 12/17/2017. Results reflect: Bone density results: OSTEOPENIA. Repeat every 2 years.  Lung Cancer Screening: (Low Dose CT Chest  recommended if Age 72-80 years, 30 pack-year currently smoking OR have quit w/in 15years.) does not qualify.   Lung Cancer Screening Referral: N/A  Additional Screening:  Hepatitis C Screening: does not qualify; Completed (aged out 89 years)  Vision Screening: Recommended annual ophthalmology exams for early detection of glaucoma and other disorders of the eye. Is the patient up to date with their annual eye exam?  Yes  Who is the provider or what is the name of the office in which the patient attends annual eye exams? [facility]  If pt is not established with a provider, would they like to be referred to a provider to establish care?  N/A .   Dental Screening: Recommended annual dental exams for proper oral hygiene  Community Resource Referral / Chronic Care Management: CRR required this visit?  No   CCM required this visit?  No    Plan:  1. Medicare annual wellness visit, subsequent 2. Annual physical exam - Counseled on 150 minutes of exercise per week as tolerated, healthy eating (including decreased daily intake of saturated fats, cholesterol, added sugars, sodium), STI prevention, and routine healthcare maintenance.  3. Screening for metabolic disorder - Routine screening.  - Hepatic Function Panel  4. Screening for deficiency anemia - Routine screening.  - CBC  5. Thyroid disorder screen - Routine screening.  - TSH  6. Type 2 diabetes mellitus with diabetic mononeuropathy, without long-term current use of insulin (HCC) - Diabetic testing supplies refilled per patient request.  - glucose blood (ONETOUCH VERIO) test strip; USE TO CHECK FASTING BLOOD SUGAR DAILY  Dispense: 100 each; Refill: 11 - Lancets (ONETOUCH DELICA PLUS [id number]) MISC; USE TO CHECK FASTING BLOOD SUGAR DAILY  Dispense: 100 each; Refill: 2  7. Excessive cerumen in ear canal, left - Left ear lavage completed today in office by Elmon Else, CMA. Patient reports feeling improved at  completion.     I have personally reviewed and noted the following in the patient's chart:   Medical and social history Use of alcohol, tobacco or illicit drugs  Current medications and supplements including opioid prescriptions. Patient is not currently taking opioid prescriptions. Functional ability and status Nutritional status Physical activity Advanced directives List of other physicians Hospitalizations, surgeries, and  ER visits in previous 12 months Vitals Screenings to include cognitive, depression, and falls Referrals and appointments  In addition, I have reviewed and discussed with patient certain preventive protocols, quality metrics, and best practice recommendations. A written personalized care plan for preventive services as well as general preventive health recommendations were provided to patient.   Camillia Herter, NP   08/06/2022

## 2022-08-06 NOTE — Progress Notes (Signed)
Subjective:   Tracey Todd is a 87 y.o. female who presents for Medicare Annual (Subsequent) preventive examination. -left ear popping -needs refill on test strips and lancets   Review of Systems    Defer to PCP        Objective:    Today's Vitals   08/06/22 1050  Resp: 16  Temp: 98.3 F (36.8 C)  Weight: 200 lb (90.7 kg)  Height: 4' 9.01" (1.448 m)  PainSc: 0-No pain   Body mass index is 43.27 kg/m.     11/20/2021    9:39 AM 05/28/2021    7:06 PM 07/20/2017    9:07 AM 06/08/2017    6:01 PM 06/02/2016    9:10 AM 09/22/2015    9:31 AM 04/24/2015    8:53 PM  Advanced Directives  Does Patient Have a Medical Advance Directive? No No No No Yes No No  Type of Personnel officer;Living will    Does patient want to make changes to medical advance directive?     No - Patient declined    Copy of [place] in Chart?     No - copy requested    Would patient like information on creating a medical advance directive?  No - Patient declined No - Patient declined No - Patient declined   No - patient declined information    Current Medications (verified) Outpatient Encounter Medications as of 08/06/2022  Medication Sig   amLODipine (NORVASC) 5 MG tablet Take 1 tablet by mouth once daily   atorvastatin (LIPITOR) 40 MG tablet Take 1 tablet (40 mg total) by mouth daily.   bisoprolol (ZEBETA) 10 MG tablet Take 1 tablet by mouth once daily for blood pressure   Blood Glucose Monitoring Suppl (ONETOUCH VERIO) w/Device KIT Use to check fasting blood sugar daily. Dx: E11.59   calcium carbonate (OS-CAL) 600 MG TABS tablet Take 600 mg by mouth daily with breakfast.   cetirizine (ZYRTEC) 10 MG tablet Take 10 mg by mouth every other day.    Cholecalciferol (VITAMIN D) 2000 UNITS CAPS Take 6,000 Units by mouth daily.    furosemide (LASIX) 40 MG tablet Take 1 tablet (40 mg total) by mouth 2 (two) times daily.   gabapentin (NEURONTIN) 300 MG capsule  Take 1 capsule (300 mg total) by mouth 2 (two) times daily.   glucose blood (ONETOUCH VERIO) test strip USE TO CHECK FASTING BLOOD SUGAR DAILY   Lancets (ONETOUCH DELICA PLUS [id number]) MISC USE TO CHECK FASTING BLOOD SUGAR DAILY   Magnesium 500 MG TABS Take 500 mg by mouth 4 (four) times daily.    metFORMIN (GLUCOPHAGE-XR) 500 MG 24 hr tablet Take 1 tablet (500 mg total) by mouth 2 (two) times daily with a meal.   potassium chloride SA (KLOR-CON M) 20 MEQ tablet Take 1 tablet (20 mEq total) by mouth daily. (Patient not taking: Reported on 07/19/2022)   spironolactone (ALDACTONE) 25 MG tablet Take 0.5 tablets (12.5 mg total) by mouth daily.   warfarin (COUMADIN) 5 MG tablet Take 1.5 tablets daily except 1 tablet on Mondays, Wednesday and Fridays daily or as [place]   No facility-administered encounter medications on file as of 08/06/2022.    Allergies (verified) Penicillins, Quinapril, Feldene [piroxicam], Calcium-containing compounds, Celebrex [celecoxib], and Daypro [oxaprozin]   History: Past Medical History:  Diagnosis Date   Atrial flutter ([place])    a. mentioned in 2016 admission.   Chronic diastolic  CHF (congestive heart failure) (HCC)    DJD (degenerative joint disease)    Fatty liver    H/O total knee replacement 02/13/2013   HTN (hypertension)    Hypercholesteremia    Mild CAD    a. minimal by cath 2016.   Nodule of chest wall    Normal coronary arteries and LVF 10/23/14 10/24/2014   Pacemaker implanted 10/25/14- St Jude 10/26/2014   Paroxysmal atrial fibrillation (HCC)    a. identified on device interrogation, burden low   Pulmonary hypertension (HCC)    Sick sinus syndrome ([place])    a. s/p STJ dual chamber PPM    Type II or unspecified type diabetes mellitus without mention of complication, not stated as uncontrolled    Vitamin D deficiency    Past Surgical History:  Procedure Laterality Date   BREAST SURGERY     CATARACT EXTRACTION     LEFT  AND RIGHT HEART CATHETERIZATION WITH CORONARY ANGIOGRAM N/A 10/23/2014   Procedure: LEFT AND RIGHT HEART CATHETERIZATION WITH CORONARY ANGIOGRAM;  Surgeon: Blane Ohara, MD;  Location: [facility] CATH LAB;  Service: Cardiovascular;  Laterality: N/A;   PERMANENT PACEMAKER INSERTION N/A 10/25/2014   STJ dual chamber PPM implanted by Dr Rayann Heman for SSS   TONSILLECTOMY AND ADENOIDECTOMY     TOTAL KNEE ARTHROPLASTY     Family History  Problem Relation Age of Onset   Diabetes Mother    Cancer Mother    Cancer Father    Social History   Socioeconomic History   Marital status: Married    Spouse name: Not on file   Number of children: Not on file   Years of education: Not on file   Highest education level: Not on file  Occupational History   Not on file  Tobacco Use   Smoking status: Former    Types: Cigarettes    Quit date: 12/06/1963    Years since quitting: 58.7    Passive exposure: Never   Smokeless tobacco: Never  Vaping Use   Vaping Use: Never used  Substance and Sexual Activity   Alcohol use: No   Drug use: Never   Sexual activity: Not Currently  Other Topics Concern   Not on file  Social History Narrative   Left Handed    Lives in a one story home    Social Determinants of Health   Financial Resource Strain: Low Risk  (05/28/2021)   Overall Financial Resource Strain (CARDIA)    Difficulty of Paying Living Expenses: Not very hard  Food Insecurity: No Food Insecurity (05/28/2021)   Hunger Vital Sign    Worried About Running Out of Food in the Last Year: Never true    [place] in the Last Year: Never true  Transportation Needs: No Transportation Needs (05/28/2021)   PRAPARE - Hydrologist (Medical): No    Lack of Transportation (Non-Medical): No  Physical Activity: Inactive (05/28/2021)   Exercise Vital Sign    Days of Exercise per Week: 0 days    Minutes of Exercise per Session: 0 min  Stress: No Stress Concern Present (05/28/2021)    [place]    Feeling of Stress : Not at all  Social Connections: Moderately Integrated (05/28/2021)   Social Connection and Isolation Panel [NHANES]    Frequency of Communication with Friends and Family: More than three times a week    Frequency of Social Gatherings with Friends and Family:  More than three times a week    Attends Religious Services: More than 4 times per year    Active Member of Clubs or Organizations: No    Attends Archivist Meetings: Never    Marital Status: Married    Tobacco Counseling Counseling given: Not Answered   Clinical Intake:  Pre-visit preparation completed: Yes  Pain : 0-10 Pain Score: 0-No pain     Diabetes: Yes CBG done?: No Did pt. bring in CBG monitor from home?: No  How often do you need to have someone help you when you read instructions, pamphlets, or other written materials from your doctor or pharmacy?: 1 - Never  Diabetic? Yes  Interpreter Needed?: No      Activities of Daily Living     No data to display           Patient Care Team: Camillia Herter, NP as PCP - General (Nurse Practitioner) [name], MD as PCP - Electrophysiology (Cardiology) Werner Lean, MD as PCP - Cardiology (Cardiology) Monna Fam, MD as Consulting Physician (Ophthalmology) Stanford Breed Denice Bors, MD as Consulting Physician (Cardiology) Josue Hector, MD as Consulting Physician (Cardiology) Laurence Spates, MD (Inactive) as Consulting Physician (Gastroenterology) Patsey Berthold, NP (Inactive) as Nurse Practitioner (Cardiology) Alda Berthold, DO as Consulting Physician (Neurology) Vickie Epley, MD as Consulting Physician (Cardiology)  Indicate any recent Medical Services you may have received from other than [facility] providers in the past year (date may be approximate).     Assessment:   This is a routine wellness examination for  [facility].  Hearing/Vision screen No results found.  Dietary issues and exercise activities discussed:     Goals Addressed   None   Depression Screen    06/30/2022   10:28 AM 03/01/2022    9:07 AM 02/01/2022    3:06 PM 09/14/2021    8:33 AM 07/30/2021    9:41 AM 06/08/2021    9:11 AM 05/28/2021    6:55 PM  PHQ 2/9 Scores  PHQ - 2 Score 0 0 0 0 0 0 0    Fall Risk    11/20/2021    9:39 AM 11/06/2021    9:01 AM 05/28/2021    7:07 PM 11/26/2020    3:03 PM 11/14/2019   11:00 AM  Fall Risk   Falls in the past year? 0 0 0 0 0  Number falls in past yr: 0 0 0 0 0  Injury with Fall? 0 0 0 0 0  Risk for fall due to :   No Fall Risks No Fall Risks   Follow up   Falls evaluation completed Falls evaluation completed     FALL RISK PREVENTION PERTAINING TO THE HOME:  Any stairs in or around the home? No  If so, are there any without handrails?  N/A Home free of loose throw rugs in walkways, pet beds, electrical cords, etc? Yes  Adequate lighting in your home to reduce risk of falls? Yes   ASSISTIVE DEVICES UTILIZED TO PREVENT FALLS:  Life alert? No  Use of a cane, walker or w/c? Yes  Grab bars in the bathroom? Yes  Shower chair or bench in shower? Yes  Elevated toilet seat or a handicapped toilet? No   TIMED UP AND GO:  Was the test performed? .  Length of time to ambulate 10 feet:  sec.     Cognitive Function:    06/08/2021    9:10 AM  MMSE -  Mini Mental State Exam  Orientation to time 4  Orientation to Place 5  Registration 3  Attention/ Calculation 4  Recall 3  Language- name 2 objects 2  Language- repeat 1  Language- follow 3 step command 3  Language- read & follow direction 1  Write a sentence 1  Copy design 1  Total score 28        05/28/2021    7:10 PM  6CIT Screen  What Year? 0 points  What month? 0 points  What time? 0 points  Count back from 20 0 points  Months in reverse 0 points  Repeat phrase 0 points  Total Score 0 points     Immunizations Immunization History  Administered Date(s) Administered   Influenza Split 05/23/2013   Influenza, High Dose Seasonal PF 05/30/2014, 05/19/2015, 06/02/2016, 05/26/2017   Influenza,inj,Quad PF,6+ Mos 05/16/2019, 05/19/2020   Influenza-Unspecified 06/02/2018, 06/02/2021   PFIZER(Purple Top)SARS-COV-2 Vaccination 10/26/2019, 11/20/2019   Pneumococcal Conjugate-13 08/06/2015, 04/09/2019   Pneumococcal Polysaccharide-23 07/29/2013   Td 08/31/2011   Tdap 11/30/2021    TDAP status: Up to date  Flu Vaccine status: Up to date  Pneumococcal vaccine status: Up to date  Covid-19 vaccine status: Completed vaccines  Qualifies for Shingles Vaccine? Yes   Zostavax completed No   Shingrix Completed?: No.    Education has been provided regarding the importance of this vaccine. Patient has been advised to call insurance company to determine out of pocket expense if they have not yet received this vaccine. Advised may also receive vaccine at local pharmacy or Health Dept. Verbalized acceptance and understanding.  Screening Tests Health Maintenance  Topic Date Due   COVID-19 Vaccine (3 - Pfizer risk series) 12/18/2019   INFLUENZA VACCINE  03/30/2022   Medicare Annual Wellness (AWV)  06/08/2022   OPHTHALMOLOGY EXAM  07/14/2022   Zoster Vaccines- Shingrix (1 of 2) 09/30/2022 (Originally 06/01/1954)   FOOT EXAM  08/10/2022   Diabetic kidney evaluation - Urine ACR  12/01/2022   HEMOGLOBIN A1C  12/29/2022   Diabetic kidney evaluation - GFR measurement  03/02/2023   DTaP/Tdap/Td (3 - Td or Tdap) 12/01/2031   Pneumonia Vaccine 35+ Years old  Completed   DEXA SCAN  Completed   HPV VACCINES  Aged Out    Health Maintenance  Health Maintenance Due  Topic Date Due   COVID-19 Vaccine (3 - Pfizer risk series) 12/18/2019   INFLUENZA VACCINE  03/30/2022   Medicare Annual Wellness (AWV)  06/08/2022   OPHTHALMOLOGY EXAM  07/14/2022    Colorectal cancer screening: No longer required.    Mammogram status: Ordered 06/25/22. Pt provided with contact info and advised to call to schedule appt.   Bone Density status: Completed 12/17/2017. Results reflect: Bone density results: OSTEOPENIA. Repeat every 2 years.  Lung Cancer Screening: (Low Dose CT Chest recommended if Age 62-80 years, 30 pack-year currently smoking OR have quit w/in 15years.) does not qualify.   Lung Cancer Screening Referral: N/A  Additional Screening:  Hepatitis C Screening: does not qualify; Completed (aged out 10 years)  Vision Screening: Recommended annual ophthalmology exams for early detection of glaucoma and other disorders of the eye. Is the patient up to date with their annual eye exam?  Yes  Who is the provider or what is the name of the office in which the patient attends annual eye exams? [facility]  If pt is not established with a provider, would they like to be referred to a provider to establish care?  N/A .  Dental Screening: Recommended annual dental exams for proper oral hygiene  Community Resource Referral / Chronic Care Management: CRR required this visit?  No   CCM required this visit?  No      Plan:     I have personally reviewed and noted the following in the patient's chart:   Medical and social history Use of alcohol, tobacco or illicit drugs  Current medications and supplements including opioid prescriptions. Patient is not currently taking opioid prescriptions. Functional ability and status Nutritional status Physical activity Advanced directives List of other physicians Hospitalizations, surgeries, and ER visits in previous 12 months Vitals Screenings to include cognitive, depression, and falls Referrals and appointments  In addition, I have reviewed and discussed with patient certain preventive protocols, quality metrics, and best practice recommendations. A written personalized care plan for preventive services as well as general preventive health  recommendations were provided to patient.     Elmon Else, RN   08/06/2022

## 2022-08-06 NOTE — Patient Instructions (Signed)
Mediterranean Diet ?A Mediterranean diet refers to food and lifestyle choices that are based on the traditions of countries located on the [place] Sea. It focuses on eating more fruits, vegetables, whole grains, beans, nuts, seeds, and heart-healthy fats, and eating less dairy, meat, eggs, and processed foods with added sugar, salt, and fat. This way of eating has been shown to help prevent certain conditions and improve outcomes for people who have chronic diseases, like kidney disease and heart disease. ?What are tips for following this plan? ?Reading food labels ?Check the serving size of packaged foods. For foods such as rice and pasta, the serving size refers to the amount of cooked product, not dry. ?Check the total fat in packaged foods. Avoid foods that have saturated fat or trans fats. ?Check the ingredient list for added sugars, such as corn syrup. ?Shopping ? ?Buy a variety of foods that offer a balanced diet, including: ?Fresh fruits and vegetables (produce). ?Grains, beans, nuts, and seeds. Some of these may be available in unpackaged forms or large amounts (in bulk). ?Fresh seafood. ?Poultry and eggs. ?Low-fat dairy products. ?Buy whole ingredients instead of prepackaged foods. ?Buy fresh fruits and vegetables in-season from local farmers markets. ?Buy plain frozen fruits and vegetables. ?If you do not have access to quality fresh seafood, buy precooked frozen shrimp or canned fish, such as tuna, salmon, or sardines. ?Stock your pantry so you always have certain foods on hand, such as olive oil, canned tuna, canned tomatoes, rice, pasta, and beans. ?Cooking ?Cook foods with extra-virgin olive oil instead of using butter or other vegetable oils. ?Have meat as a side dish, and have vegetables or grains as your main dish. This means having meat in small portions or adding small amounts of meat to foods like pasta or stew. ?Use beans or vegetables instead of meat in common dishes like chili or  lasagna. ?Experiment with different cooking methods. Try roasting, broiling, steaming, and saut?ing vegetables. ?Add frozen vegetables to soups, stews, pasta, or rice. ?Add nuts or seeds for added healthy fats and plant protein at each meal. You can add these to yogurt, salads, or vegetable dishes. ?Marinate fish or vegetables using olive oil, lemon juice, garlic, and fresh herbs. ?Meal planning ?Plan to eat one vegetarian meal one day each week. Try to work up to two vegetarian meals, if possible. ?Eat seafood two or more times a week. ?Have healthy snacks readily available, such as: ?Vegetable sticks with hummus. ?Greek yogurt. ?Fruit and nut trail mix. ?Eat balanced meals throughout the week. This includes: ?Fruit: 2-3 servings a day. ?Vegetables: 4-5 servings a day. ?Low-fat dairy: 2 servings a day. ?Fish, poultry, or lean meat: 1 serving a day. ?Beans and legumes: 2 or more servings a week. ?Nuts and seeds: 1-2 servings a day. ?Whole grains: 6-8 servings a day. ?Extra-virgin olive oil: 3-4 servings a day. ?Limit red meat and sweets to only a few servings a month. ?Lifestyle ? ?Cook and eat meals together with your family, when possible. ?Drink enough fluid to keep your urine pale yellow. ?Be physically active every day. This includes: ?Aerobic exercise like running or swimming. ?Leisure activities like gardening, walking, or housework. ?Get 7-8 hours of sleep each night. ?If recommended by your health care provider, drink red wine in moderation. This means 1 glass a day for nonpregnant women and 2 glasses a day for men. A glass of wine equals 5 oz (150 mL). ?What foods should I eat? ?Fruits ?Apples. Apricots. Avocado. Berries. Bananas. Cherries. Dates.   Figs. Grapes. Lemons. Melon. Oranges. Peaches. Plums. Pomegranate. ?Vegetables ?Artichokes. Beets. Broccoli. Cabbage. Carrots. Eggplant. Green beans. Chard. Kale. Spinach. Onions. Leeks. Peas. Squash. Tomatoes. Peppers. Radishes. ?Grains ?Whole-grain pasta. Brown  rice. Bulgur wheat. Polenta. Couscous. Whole-wheat bread. Oatmeal. Quinoa. ?Meats and other proteins ?Beans. Almonds. Sunflower seeds. Pine nuts. Peanuts. Cod. Salmon. Scallops. Shrimp. Tuna. Tilapia. Clams. Oysters. Eggs. Poultry without skin. ?Dairy ?Low-fat milk. Cheese. Greek yogurt. ?Fats and oils ?Extra-virgin olive oil. Avocado oil. Grapeseed oil. ?Beverages ?Water. Red wine. Herbal tea. ?Sweets and desserts ?Greek yogurt with honey. Baked apples. Poached pears. Trail mix. ?Seasonings and condiments ?Basil. Cilantro. Coriander. Cumin. Mint. Parsley. Sage. Rosemary. Tarragon. Garlic. Oregano. Thyme. Pepper. Balsamic vinegar. Tahini. Hummus. Tomato sauce. Olives. Mushrooms. ?The items listed above may not be a complete list of foods and beverages you can eat. Contact a dietitian for more information. ?What foods should I limit? ?This is a list of foods that should be eaten rarely or only on special occasions. ?Fruits ?Fruit canned in syrup. ?Vegetables ?Deep-fried potatoes (french fries). ?Grains ?Prepackaged pasta or rice dishes. Prepackaged cereal with added sugar. Prepackaged snacks with added sugar. ?Meats and other proteins ?Beef. Pork. Lamb. Poultry with skin. Hot dogs. Bacon. ?Dairy ?Ice cream. Sour cream. Whole milk. ?Fats and oils ?Butter. Canola oil. Vegetable oil. Beef fat (tallow). Lard. ?Beverages ?Juice. Sugar-sweetened soft drinks. Beer. Liquor and spirits. ?Sweets and desserts ?Cookies. Cakes. Pies. Candy. ?Seasonings and condiments ?Mayonnaise. Pre-made sauces and marinades. ?The items listed above may not be a complete list of foods and beverages you should limit. Contact a dietitian for more information. ?Summary ?The Mediterranean diet includes both food and lifestyle choices. ?Eat a variety of fresh fruits and vegetables, beans, nuts, seeds, and whole grains. ?Limit the amount of red meat and sweets that you eat. ?If recommended by your health care provider, drink red wine in moderation.  This means 1 glass a day for nonpregnant women and 2 glasses a day for men. A glass of wine equals 5 oz (150 mL). ?This information is not intended to replace advice given to you by your health care provider. Make sure you discuss any questions you have with your health care provider. ?Document Revised: 09/21/2019 Document Reviewed: 07/19/2019 ?Elsevier Patient Education ? 2023 [employer]vier [employer] ? ?

## 2022-08-07 LAB — CBC
Hematocrit: 36.9 % (ref 34.0–46.6)
Hemoglobin: 11.6 g/dL (ref 11.1–15.9)
MCH: 27.3 pg (ref 26.6–33.0)
MCHC: 31.4 g/dL — ABNORMAL LOW (ref 31.5–35.7)
MCV: 87 fL (ref 79–97)
Platelets: 231 10*3/uL (ref 150–450)
RBC: 4.25 x10E6/uL (ref 3.77–5.28)
RDW: 17 % — ABNORMAL HIGH (ref 11.7–15.4)
WBC: 6.9 10*3/uL (ref 3.4–10.8)

## 2022-08-07 LAB — BASIC METABOLIC PANEL
BUN/Creatinine Ratio: 19 (ref 12–28)
BUN: 23 mg/dL (ref 8–27)
CO2: 21 mmol/L (ref 20–29)
Calcium: 9.5 mg/dL (ref 8.7–10.3)
Chloride: 107 mmol/L — ABNORMAL HIGH (ref 96–106)
Creatinine, Ser: 1.21 mg/dL — ABNORMAL HIGH (ref 0.57–1.00)
Glucose: 153 mg/dL — ABNORMAL HIGH (ref 70–99)
Potassium: 4.6 mmol/L (ref 3.5–5.2)
Sodium: 148 mmol/L — ABNORMAL HIGH (ref 134–144)
eGFR: 44 mL/min/{1.73_m2} — ABNORMAL LOW (ref >59)

## 2022-08-07 LAB — HEPATIC FUNCTION PANEL
ALT: 20 IU/L (ref 0–32)
AST: 27 IU/L (ref 0–40)
Albumin: 4 g/dL (ref 3.7–4.7)
Alkaline Phosphatase: 60 IU/L (ref 44–121)
Bilirubin Total: 0.6 mg/dL (ref 0.0–1.2)
Bilirubin, Direct: 0.21 mg/dL (ref 0.00–0.40)
Total Protein: 6.6 g/dL (ref 6.0–8.5)

## 2022-08-07 LAB — PRO B NATRIURETIC PEPTIDE: NT-Pro BNP: 2368 pg/mL — ABNORMAL HIGH (ref 0–738)

## 2022-08-07 LAB — TSH: TSH: 2.71 u[IU]/mL (ref 0.450–4.500)

## 2022-08-09 ENCOUNTER — Telehealth: Payer: Self-pay | Admitting: Internal Medicine

## 2022-08-09 DIAGNOSIS — I509 Heart failure, unspecified: Secondary | ICD-10-CM

## 2022-08-09 NOTE — Telephone Encounter (Signed)
Pt is returning call in regards to echo results.  

## 2022-08-10 MED ORDER — DAPAGLIFLOZIN PROPANEDIOL 10 MG PO TABS: 10.0000 mg | ORAL_TABLET | Freq: Every day | ORAL | 3 refills | Status: DC

## 2022-08-10 NOTE — Telephone Encounter (Signed)
-----   Message from Christell Constant, MD sent at 08/08/2022  3:53 PM EST ----- Results: BNP elevated Glucose elevated Creatinine increase is likely related to unmasking of CKD Plan: Faxiga 10 mg as discussed prior BMP in two weeks  Christell Constant, MD

## 2022-08-10 NOTE — Progress Notes (Signed)
Cardiology Office Note:    Date:  08/11/2022   ID:  Tracey Todd, DOB 09-19-1934, MRN [id number]  PCP:  Camillia Herter, NP   [place] [facility] Cardiologist:  Werner Lean, MD Cardiology APP:  Patsey Berthold, NP (Inactive)  Electrophysiologist:  [name], MD     Referring MD: Camillia Herter, NP   CC: DOD LE swelling  History of Present Illness:    Tracey Todd is a 87 y.o. female with a hx of long standing persistent  AFL and HFpEF, HTN and HLD.  Mild CAD.   2023: Found to have severe TR; started higher dose lasix, and MRA, and SGLT2i.  Found to have severe TR.  Patient notes that she is feeling more tired and fatigue.   Has leg tightness.  Still very swollen.  Legs were red, this resolved. No weight gain but minimal weight loss. No palpitations or syncope. Had new wheezing. There are no interval hospital/ED visit.     Past Medical History:  Diagnosis Date   Atrial flutter ([place])    a. mentioned in 2016 admission.   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 minimal by cath 2016.   Nodule of chest wall    Normal coronary arteries and LVF 10/23/14 10/24/2014   Pacemaker implanted 10/25/14- St Jude 10/26/2014   Paroxysmal atrial fibrillation (HCC)    a. identified on device interrogation, burden low   Pulmonary hypertension (HCC)    Sick sinus syndrome ([place])    a. s/p STJ dual chamber PPM    Type II or unspecified type diabetes mellitus without mention of complication, not stated as uncontrolled    Vitamin D deficiency     Past Surgical History:  Procedure Laterality Date   BREAST SURGERY     CATARACT EXTRACTION     LEFT AND RIGHT HEART CATHETERIZATION WITH CORONARY ANGIOGRAM N/A 10/23/2014   Procedure: LEFT AND RIGHT HEART CATHETERIZATION WITH CORONARY ANGIOGRAM;   Surgeon: Blane Ohara, MD;  Location: [facility] CATH LAB;  Service: Cardiovascular;  Laterality: N/A;   PERMANENT PACEMAKER INSERTION N/A 10/25/2014   STJ dual chamber PPM implanted by Dr Rayann Heman for SSS   TONSILLECTOMY AND ADENOIDECTOMY     TOTAL KNEE ARTHROPLASTY      Current Medications: Current Meds  Medication Sig   amLODipine (NORVASC) 5 MG tablet Take 1 tablet by mouth once daily   atorvastatin (LIPITOR) 40 MG tablet Take 1 tablet (40 mg total) by mouth daily.   bisoprolol (ZEBETA) 10 MG tablet Take 1 tablet by mouth once daily for blood pressure   Blood Glucose Monitoring Suppl (ONETOUCH VERIO) w/Device KIT Use to check fasting blood sugar daily. Dx: E11.59   calcium carbonate (OS-CAL) 600 MG TABS tablet Take 600 mg by mouth daily with breakfast.   cetirizine (ZYRTEC) 10 MG tablet Take 10 mg by mouth every other day.    Cholecalciferol (VITAMIN D) 2000 UNITS CAPS Take 6,000 Units by mouth daily.    dapagliflozin propanediol (FARXIGA) 10 MG TABS tablet Take 1 tablet (10 mg total) by mouth daily before breakfast.   gabapentin (NEURONTIN) 300 MG capsule Take 1 capsule (300 mg total) by mouth 2 (two) times daily.   glucose blood (ONETOUCH VERIO) test strip USE TO CHECK FASTING  BLOOD SUGAR DAILY   Lancets (ONETOUCH DELICA PLUS [id number]) MISC USE TO CHECK FASTING BLOOD SUGAR DAILY   Magnesium 500 MG TABS Take 1,000 mg by mouth in the morning and at bedtime. 2 tabs am, 2 tab pm   metFORMIN (GLUCOPHAGE-XR) 500 MG 24 hr tablet Take 1 tablet (500 mg total) by mouth 2 (two) times daily with a meal.   spironolactone (ALDACTONE) 25 MG tablet Take 0.5 tablets (12.5 mg total) by mouth daily.   Torsemide 40 MG TABS Take 40 mg by mouth in the morning and at bedtime.   warfarin (COUMADIN) 5 MG tablet Take 1.5 tablets daily except 1 tablet on Mondays, Wednesday and Fridays daily or as [place]   [DISCONTINUED] furosemide (LASIX) 40 MG tablet Take 1 tablet (40 mg total) by mouth  2 (two) times daily.     Allergies:   Penicillins, Quinapril, Feldene [piroxicam], Calcium-containing compounds, Celebrex [celecoxib], and Daypro [oxaprozin]   Social History   Socioeconomic History   Marital status: Married    Spouse name: Not on file   Number of children: Not on file   Years of education: Not on file   Highest education level: Not on file  Occupational History   Not on file  Tobacco Use   Smoking status: Former    Types: Cigarettes    Quit date: 12/06/1963    Years since quitting: 58.7    Passive exposure: Never   Smokeless tobacco: Never  Vaping Use   Vaping Use: Never used  Substance and Sexual Activity   Alcohol use: No   Drug use: Never   Sexual activity: Not Currently  Other Topics Concern   Not on file  Social History Narrative   Left Handed    Lives in a one story home    Social Determinants of Health   Financial Resource Strain: Low Risk  (05/28/2021)   Overall Financial Resource Strain (CARDIA)    Difficulty of Paying Living Expenses: Not very hard  Food Insecurity: No Food Insecurity (05/28/2021)   Hunger Vital Sign    Worried About Running Out of Food in the Last Year: Never true    Ran Out of Food in the Last Year: Never true  Transportation Needs: No Transportation Needs (05/28/2021)   PRAPARE - Hydrologist (Medical): No    Lack of Transportation (Non-Medical): No  Physical Activity: Inactive (05/28/2021)   Exercise Vital Sign    Days of Exercise per Week: 0 days    Minutes of Exercise per Session: 0 min  Stress: No Stress Concern Present (05/28/2021)   [place]    Feeling of Stress : Not at all  Social Connections: Moderately Integrated (05/28/2021)   Social Connection and Isolation Panel [NHANES]    Frequency of Communication with Friends and Family: More than three times a week    Frequency of Social Gatherings with Friends and Family:  More than three times a week    Attends Religious Services: More than 4 times per year    Active Member of [employer] or Organizations: No    Attends Archivist Meetings: Never    Marital Status: Married     Family History: The patient's family history includes Cancer in her father and mother; Diabetes in her mother.  ROS:   Please see the history of present illness.     All other systems reviewed and are negative.  EKGs/Labs/Other  Studies Reviewed:    The following studies were reviewed today:   EKG:    Cardiac Studies & Procedures       ECHOCARDIOGRAM  ECHOCARDIOGRAM COMPLETE 08/06/2022  Narrative ECHOCARDIOGRAM REPORT    Patient Name:   Tracey Todd Date of Exam: 08/06/2022 Medical Rec #:  [id number]       Height:       57.0 in Accession #:    [id number]      Weight:       200.0 lb Date of Birth:  1935/02/15       BSA:          1.799 m Patient Age:    59 years        BP:           129/76 mmHg Patient Gender: F               HR:           60 bpm. Exam Location:  [place]  Procedure: 2D Echo, Cardiac Doppler and Color Doppler  Indications:    I50.9* Heart failure (unspecified)  History:        Patient has prior history of Echocardiogram examinations, most recent 08/28/2018. Pacemaker, Arrythmias:Atrial Fibrillation and Atrial Flutter, Signs/Symptoms:Chest Pain; Risk Factors:Hypertension, Diabetes and Dyslipidemia. Pulmonary hypertension. Lower extremity edema. Sick sinus syndrome.  Sonographer:    Diamond Nickel RCS Referring Phys: [id number] [facility] A [place]   Sonographer Comments: Did not administer Definity, patient states that it is extremely difficult to obtain IV access. IMPRESSIONS   1. Left ventricular ejection fraction, by estimation, is 60 to 65%. The left ventricle has normal function. Left ventricular endocardial border not optimally defined to evaluate regional wall motion. There is mild left ventricular hypertrophy of  the basal-septal segment. Left ventricular diastolic function could not be evaluated. 2. Right ventricular systolic function is mildly reduced. The right ventricular size is normal. There is moderately elevated pulmonary artery systolic pressure. The estimated right ventricular systolic pressure is 73.2 mmHg. 3. Left atrial size was mildly dilated. 4. Right atrial size was moderately dilated. 5. The mitral valve is normal in structure. Mild mitral valve regurgitation. 6. The tricuspid valve is abnormal. Tricuspid valve regurgitation is severe. 7. The aortic valve is tricuspid. There is mild calcification of the aortic valve. There is mild thickening of the aortic valve. Aortic valve regurgitation is not visualized. Aortic valve sclerosis/calcification is present, without any evidence of aortic stenosis.  FINDINGS Left Ventricle: Left ventricular ejection fraction, by estimation, is 60 to 65%. The left ventricle has normal function. Left ventricular endocardial border not optimally defined to evaluate regional wall motion. The left ventricular internal cavity size was normal in size. There is mild left ventricular hypertrophy of the basal-septal segment. Abnormal (paradoxical) septal motion, consistent with RV pacemaker. Left ventricular diastolic function could not be evaluated due to atrial fibrillation. Left ventricular diastolic function could not be evaluated.  Right Ventricle: The right ventricular size is normal. Right vetricular wall thickness was not well visualized. Right ventricular systolic function is mildly reduced. There is moderately elevated pulmonary artery systolic pressure. The tricuspid regurgitant velocity is 3.41 m/s, and with an assumed right atrial pressure of 10 mmHg, the estimated right ventricular systolic pressure is 20.2 mmHg.  Left Atrium: Left atrial size was mildly dilated.  Right Atrium: Right atrial size was moderately dilated.  Pericardium: There is no  evidence of pericardial effusion.  Mitral Valve: The mitral valve is normal in structure.  Mild mitral valve regurgitation, with centrally-directed jet.  Tricuspid Valve: The tricuspid valve is abnormal. Tricuspid valve regurgitation is severe.  Aortic Valve: The aortic valve is tricuspid. There is mild calcification of the aortic valve. There is mild thickening of the aortic valve. Aortic valve regurgitation is not visualized. Aortic valve sclerosis/calcification is present, without any evidence of aortic stenosis.  Pulmonic Valve: The pulmonic valve was grossly normal. Pulmonic valve regurgitation is trivial.  Aorta: The aortic root and ascending aorta are structurally normal, with no evidence of dilitation.  Venous: The inferior vena cava was not well visualized.  IAS/Shunts: The interatrial septum was not well visualized.  Additional Comments: A device lead is visualized in the right ventricle.   LEFT VENTRICLE PLAX 2D LVIDd:         4.30 cm LVIDs:         2.30 cm LV PW:         1.00 cm LV IVS:        1.30 cm LVOT diam:     2.20 cm LV SV:         58 LV SV Index:   32 LVOT Area:     3.80 cm   RIGHT VENTRICLE RV Basal diam:  3.90 cm TAPSE (M-mode): 1.5 cm RVSP:           49.5 mmHg  LEFT ATRIUM           Index        RIGHT ATRIUM           Index LA diam:      3.90 cm 2.17 cm/m   RA Pressure: 3.00 mmHg LA Vol (A4C): 44.1 ml 24.52 ml/m  RA Area:     23.10 cm RA Volume:   71.80 ml  39.92 ml/m AORTIC VALVE LVOT Vmax:   74.80 cm/s LVOT Vmean:  46.267 cm/s LVOT VTI:    0.154 m  AORTA Ao Root diam: 3.40 cm Ao Asc diam:  3.10 cm  TRICUSPID VALVE TR Peak grad:   46.5 mmHg TR Vmax:        341.00 cm/s Estimated RAP:  3.00 mmHg RVSP:           49.5 mmHg  SHUNTS Systemic VTI:  0.15 m Systemic Diam: 2.20 cm  Dani Gobble Croitoru MD Electronically signed by Sanda Klein MD Signature Date/Time: 08/06/2022/5:35:30 PM    Final              Recent Labs: 08/06/2022:  ALT 20; BUN 23; Creatinine, Ser 1.21; Hemoglobin 11.6; NT-Pro BNP 2,368; Platelets 231; Potassium 4.6; Sodium 148; TSH 2.710  Recent Lipid Panel    Component Value Date/Time   CHOL 194 11/30/2021 0937   TRIG 122 11/30/2021 0937   HDL 59 11/30/2021 0937   CHOLHDL 3.3 11/30/2021 0937   CHOLHDL 2.5 06/02/2018 0950   VLDL 15 03/28/2017 0907   LDLCALC 113 (H) 11/30/2021 0937   LDLCALC 86 06/02/2018 0950     Physical Exam:    VS:  BP (!) 140/68   Pulse 71   Ht _0  (1.448 m)   Wt 200 lb 9.6 oz (91 kg)   SpO2 96%   BMI 43.41 kg/m     Wt Readings from Last 3 Encounters:  08/11/22 200 lb 9.6 oz (91 kg)  08/06/22 200 lb (90.7 kg)  07/19/22 200 lb 3.2 oz (90.8 kg)    GEN: Elderly female morbid obesity CARDIAC: IRIR  notable systolic murmur, no rubs, gallops RESPIRATORY:  Clear to auscultation without rales, wheezing or rhonchi  ABDOMEN: Soft, non-tender, non-distended MUSCULOSKELETAL:  +2 bilateral edema (painful right); No deformity  SKIN: Warm and dry NEUROLOGIC:  Alert and oriented x 3 PSYCHIATRIC:  Normal affect   ASSESSMENT:    1. Pulmonary hypertension, unspecified ([place])   2. Atrial fibrillation, unspecified type ([place])   3. Acute on chronic heart failure with preserved ejection fraction (HCC)     PLAN:    HFpEF Pulmonary hypertension NOS Persistent AFL- CHADSVASC 5 Mild CAD CKD IIIa - I have discussed ED assessment/hospitalization; she would like to avoid that if at all possible - will transition to torsemide 40 mg PO BID - continue Farxiga (new start) - continue aldactone 12. 5  - increase lasix to 40 mg PO BID - continue aldactone 12.5 mg PO daily   - BNP and BMP on 08/17/22 coumadin visit; will likely need K or increase in aldactone - I discussed with patient and family that if she does not improve she would likely need ED assessment and potential admission  - at 09/03/21 visit, if she is euvolemic I would strongly consider RHC to clarify [place] diagnosis  ([place], [place], or Dr. Chauncey Cruel). - No signs or symptoms of connective tissue disease and no family history of the CT or PH - No weight loss drug use  - No methamphetamine or other illicit substance use - No history of DVT or PE to suggest chronic thromboembolic disease; VQ Scan would be considered at that time, would eval for OSA at that time as well   Keep 09/03/22 follow up  Medication Adjustments/Labs and Tests Ordered: Current medicines are reviewed at length with the patient today.  Concerns regarding medicines are outlined above.  Orders Placed This Encounter  Procedures   Basic metabolic panel   Pro b natriuretic peptide (BNP)   Meds ordered this encounter  Medications   Torsemide 40 MG TABS    Sig: Take 40 mg by mouth in the morning and at bedtime.    Dispense:  180 tablet    Refill:  3    Patient Instructions  Medication Instructions:  Your physician has recommended you make the following change in your medication:   Stop taking Lasix 2. Start taking Torsemide 70m 2 times daily *If you need a refill on your cardiac medications before your next appointment, please call your pharmacy*   Lab Work: [facility] If you have labs (blood work) drawn today and your tests are completely normal, you will receive your results only by: [place](if you have MyChart) OR A paper copy in the mail If you have any lab test that is abnormal or we need to change your treatment, we will call you to review the results.   Follow-Up: [facility] and your health needs are our priority.  As part of our continuing mission to provide you with exceptional heart care, we have created designated Provider Care Teams.  These Care Teams include your primary Cardiologist (physician) and Advanced Practice Providers (APPs -  Physician Assistants and Nurse Practitioners) who all work together to provide you with the care you need, when you need  it.  We recommend signing up for the patient portal called "MyChart".  Sign up information is provided on this After Visit Summary.  MyChart is used to connect with patients for Virtual Visits (Telemedicine).  Patients are able to view lab/test results, encounter notes, upcoming appointments, etc.  Non-urgent  messages can be sent to your provider as well.   To learn more about what you can do with MyChart, go to [email or web address].    Your next appointment:   As scheduled   The format for your next appointment:   In Person  Provider:   Christen Bame, NP        Important Information About Sugar         Signed, Werner Lean, MD  08/11/2022 2:53 PM    [place]

## 2022-08-10 NOTE — Telephone Encounter (Signed)
The patient has been notified of the result and verbalized understanding.  All questions (if any) were answered. Arvid Right Carly Applegate, RN 08/10/2022 10:09 AM   Pt will come in for f/u lab work on 08/26/22. 30 days of samples, code for 30 day free trial and information for pt assistance left at the front desk for pt to pick up.  Pt to have family member pick up medication.

## 2022-08-11 ENCOUNTER — Telehealth: Payer: Self-pay | Admitting: Internal Medicine

## 2022-08-11 ENCOUNTER — Encounter: Payer: Self-pay | Admitting: Internal Medicine

## 2022-08-11 ENCOUNTER — Ambulatory Visit: Payer: Medicare Other | Attending: Internal Medicine | Admitting: Internal Medicine

## 2022-08-11 VITALS — BP 140/68 | HR 71 | Ht <= 58 in | Wt 200.6 lb

## 2022-08-11 DIAGNOSIS — I4891 Unspecified atrial fibrillation: Secondary | ICD-10-CM

## 2022-08-11 DIAGNOSIS — I5033 Acute on chronic diastolic (congestive) heart failure: Secondary | ICD-10-CM | POA: Diagnosis not present

## 2022-08-11 DIAGNOSIS — I272 Pulmonary hypertension, unspecified: Secondary | ICD-10-CM | POA: Diagnosis not present

## 2022-08-11 DIAGNOSIS — I480 Paroxysmal atrial fibrillation: Secondary | ICD-10-CM | POA: Insufficient documentation

## 2022-08-11 MED ORDER — TORSEMIDE 40 MG PO TABS: 40.0000 mg | ORAL_TABLET | Freq: Two times a day (BID) | ORAL | 3 refills | Status: DC

## 2022-08-11 NOTE — Patient Instructions (Signed)
Medication Instructions:  Your physician has recommended you make the following change in your medication:   Stop taking Lasix 2. Start taking Torsemide 40mg  2 times daily *If you need a refill on your cardiac medications before your next appointment, please call your pharmacy*   Lab Work: BMET, at [facility] on 08/17/22 If you have labs (blood work) drawn today and your tests are completely normal, you will receive your results only by: MyChart Message (if you have MyChart) OR A paper copy in the mail If you have any lab test that is abnormal or we need to change your treatment, we will call you to review the results.   Follow-Up: At [facility], you and your health needs are our priority.  As part of our continuing mission to provide you with exceptional heart care, we have created designated Provider Care Teams.  These Care Teams include your primary Cardiologist (physician) and Advanced Practice Providers (APPs -  Physician Assistants and Nurse Practitioners) who all work together to provide you with the care you need, when you need it.  We recommend signing up for the patient portal called "MyChart".  Sign up information is provided on this After Visit Summary.  MyChart is used to connect with patients for Virtual Visits (Telemedicine).  Patients are able to view lab/test results, encounter notes, upcoming appointments, etc.  Non-urgent messages can be sent to your provider as well.   To learn more about what you can do with MyChart, go to [facility].    Your next appointment:   As scheduled   The format for your next appointment:   In Person  Provider:   [email or web address], NP        Important Information About Sugar

## 2022-08-11 NOTE — Telephone Encounter (Signed)
Pt c/o medication issue:  1. Name of Medication:   Torsemide 40 MG TABS   2. How are you currently taking this medication (dosage and times per day)? Not started yet  3. Are you having a reaction (difficulty breathing--STAT)? N/A  4. What is your medication issue?   Daughter stated pharmacy told them this medication would need to be approved by patient's insurance.  Daughter stated the patient would need an alternate medication of what are the next steps.

## 2022-08-13 ENCOUNTER — Telehealth: Payer: Self-pay | Admitting: Internal Medicine

## 2022-08-13 MED ORDER — TORSEMIDE 20 MG PO TABS: 40.0000 mg | ORAL_TABLET | Freq: Two times a day (BID) | ORAL | 3 refills | Status: DC

## 2022-08-13 NOTE — Progress Notes (Unsigned)
Virtual Visit via Telephone Note  I connected with Tracey Todd, on 08/17/2022 at 3:08 PM by telephone and verified that I am speaking with the correct person using two identifiers.  Consent: I discussed the limitations, risks, security and privacy concerns of performing an evaluation and management service by telephone and the availability of in person appointments. I also discussed with the patient that there may be a patient responsible charge related to this service. The patient expressed understanding and agreed to proceed.   Location of Patient: Home  Location of Provider: [facility] [place] participating in Telemedicine visit: Howard Pouch, NP Elmon Else, CMA  History of Present Illness: Tracey Todd is a 87 y.o. female who presents for referral to Endocrinology for diabetes management. She is taking Metformin as prescribed. Concerns regarding home blood sugars 160's fasting during the mornings. States she does enjoy eating breads. No further issues/concerns.  Past Medical History:  Diagnosis Date   Atrial flutter ([place])    a. mentioned in 2016 admission.   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 minimal by cath 2016.   Nodule of chest wall    Normal coronary arteries and LVF 10/23/14 10/24/2014   Pacemaker implanted 10/25/14- St Jude 10/26/2014   Paroxysmal atrial fibrillation (HCC)    a. identified on device interrogation, burden low   Pulmonary hypertension (HCC)    Sick sinus syndrome ([place])    a. s/p STJ dual chamber PPM    Type II or unspecified type diabetes mellitus without mention of complication, not stated as uncontrolled    Vitamin D deficiency    Allergies  Allergen Reactions   Penicillins Anaphylaxis   Quinapril      Angioedema    Feldene [Piroxicam] Other (See Comments)    Bleeding    Calcium-Containing Compounds Other (See Comments)    "calcium deposits on skin"   Celebrex [Celecoxib] Itching   Daypro [Oxaprozin] Itching    Current Outpatient Medications on File Prior to Visit  Medication Sig Dispense Refill   amLODipine (NORVASC) 5 MG tablet Take 1 tablet by mouth once daily 90 tablet 0   atorvastatin (LIPITOR) 40 MG tablet Take 1 tablet (40 mg total) by mouth daily. 90 tablet 1   bisoprolol (ZEBETA) 10 MG tablet Take 1 tablet by mouth once daily for blood pressure 90 tablet 0   Blood Glucose Monitoring Suppl (ONETOUCH VERIO) w/Device KIT Use to check fasting blood sugar daily. Dx: E11.59 1 kit 0   calcium carbonate (OS-CAL) 600 MG TABS tablet Take 600 mg by mouth daily with breakfast.     cetirizine (ZYRTEC) 10 MG tablet Take 10 mg by mouth every other day.      Cholecalciferol (VITAMIN D) 2000 UNITS CAPS Take 6,000 Units by mouth daily.      dapagliflozin propanediol (FARXIGA) 10 MG TABS tablet Take 1 tablet (10 mg total) by mouth daily before breakfast. 90 tablet 3   gabapentin (NEURONTIN) 300 MG capsule Take 1 capsule (300 mg total) by mouth 2 (two) times daily. 180 capsule 1   glucose blood (ONETOUCH VERIO) test strip USE TO CHECK FASTING BLOOD SUGAR DAILY 100 each 11   Lancets (ONETOUCH DELICA PLUS [id number]) MISC USE TO CHECK FASTING  BLOOD SUGAR DAILY 100 each 2   Magnesium 500 MG TABS Take 1,000 mg by mouth in the morning and at bedtime. 2 tabs am, 2 tab pm     metFORMIN (GLUCOPHAGE-XR) 500 MG 24 hr tablet Take 1 tablet (500 mg total) by mouth 2 (two) times daily with a meal. 60 tablet 2   spironolactone (ALDACTONE) 25 MG tablet Take 0.5 tablets (12.5 mg total) by mouth daily. 45 tablet 3   torsemide (DEMADEX) 20 MG tablet Take 2 tablets (40 mg total) by mouth 2 (two) times daily. 360 tablet 3   warfarin (COUMADIN) 5 MG tablet Take 1.5 tablets daily except 1 tablet on Mondays, Wednesday and  Fridays daily or as [place] 40 tablet 3   No current facility-administered medications on file prior to visit.    Observations/Objective: Alert and oriented x 3. Not in acute distress. Physical examination not completed as this is a telemedicine visit.  Assessment and Plan: 1. Type 2 diabetes mellitus with diabetic mononeuropathy, without long-term current use of insulin (HCC) - Hemoglobin A1c 7.9% on 06/30/2022. This was improved from previous 8.3% - Continue Metformin as prescribed. No refills needed as of present.  - Per patient request referral to Endocrinology for further evaluation/management.  - Ambulatory referral to Endocrinology   Follow Up Instructions: Referral to Endocrinology. Follow-up with primary provider as scheduled.    Patient was given clear instructions to go to Emergency Department or return to medical center if symptoms don't improve, worsen, or new problems develop.The patient verbalized understanding.  I discussed the assessment and treatment plan with the patient. The patient was provided an opportunity to ask questions and all were answered. The patient agreed with the plan and demonstrated an understanding of the instructions.   The patient was advised to call back or seek an in-person evaluation if the symptoms worsen or if the condition fails to improve as anticipated.     I provided 5 minutes total of non-face-to-face time during this encounter.   Camillia Herter, NP  [facility] [place], [place] 08/17/2022, 3:08 PM

## 2022-08-13 NOTE — Telephone Encounter (Signed)
Spoke with patient, she states pharmacy said insurance needed to approve script for Torsemide 40mg . Spoke with pharmacy to clarify, Torsemide 40mg  is only available as a brand name. Changed script to Torsemide 20mg  2 tablets 2 times a day which is available as a generic.   Explained dosing to patient and she verbalized understanding.

## 2022-08-13 NOTE — Telephone Encounter (Signed)
Pt c/o medication issue:  1. Name of Medication: Torsemide 40 MG TABS   2. How are you currently taking this medication (dosage and times per day)?   Take 40 mg by mouth in the morning and at bedtime.    3. Are you having a reaction (difficulty breathing--STAT)? no  4. What is your medication issue? Unable to get medication to the insurance verify. Calling with questions/concerns. Please advise

## 2022-08-13 NOTE — Addendum Note (Signed)
Addended by: Alvin Critchley A on: 08/13/2022 09:15 AM   Modules accepted: Orders

## 2022-08-17 ENCOUNTER — Ambulatory Visit (INDEPENDENT_AMBULATORY_CARE_PROVIDER_SITE_OTHER): Payer: Medicare Other | Admitting: Family

## 2022-08-17 ENCOUNTER — Ambulatory Visit: Payer: Medicare Other | Attending: Cardiology

## 2022-08-17 DIAGNOSIS — I480 Paroxysmal atrial fibrillation: Secondary | ICD-10-CM

## 2022-08-17 DIAGNOSIS — E1141 Type 2 diabetes mellitus with diabetic mononeuropathy: Secondary | ICD-10-CM

## 2022-08-17 DIAGNOSIS — Z5181 Encounter for therapeutic drug level monitoring: Secondary | ICD-10-CM | POA: Diagnosis not present

## 2022-08-17 DIAGNOSIS — Z7984 Long term (current) use of oral hypoglycemic drugs: Secondary | ICD-10-CM | POA: Diagnosis not present

## 2022-08-17 DIAGNOSIS — Z7901 Long term (current) use of anticoagulants: Secondary | ICD-10-CM

## 2022-08-17 LAB — POCT INR: INR: 3 (ref 2.0–3.0)

## 2022-08-17 NOTE — Patient Instructions (Signed)
Continue taking 1 tablet daily except 1.5 tablets on Sundays, Tuesday, and Thursdays. Continue eating 2 serving of greens per week plus your one day of brussels sprouts. Recheck INR in 4 weeks. [facility] [phone]

## 2022-08-25 ENCOUNTER — Ambulatory Visit (INDEPENDENT_AMBULATORY_CARE_PROVIDER_SITE_OTHER): Payer: Medicare Other

## 2022-08-25 ENCOUNTER — Ambulatory Visit: Payer: Self-pay

## 2022-08-25 ENCOUNTER — Ambulatory Visit
Admission: EM | Admit: 2022-08-25 | Discharge: 2022-08-25 | Disposition: A | Discharge status: Home / Self Care | Payer: Medicare Other | Attending: Physician Assistant | Admitting: Physician Assistant

## 2022-08-25 DIAGNOSIS — I509 Heart failure, unspecified: Secondary | ICD-10-CM | POA: Insufficient documentation

## 2022-08-25 DIAGNOSIS — Z1152 Encounter for screening for COVID-19: Secondary | ICD-10-CM | POA: Insufficient documentation

## 2022-08-25 DIAGNOSIS — J069 Acute upper respiratory infection, unspecified: Secondary | ICD-10-CM | POA: Diagnosis not present

## 2022-08-25 DIAGNOSIS — R059 Cough, unspecified: Secondary | ICD-10-CM | POA: Diagnosis not present

## 2022-08-25 MED ORDER — BENZONATATE 100 MG PO CAPS: 100.0000 mg | ORAL_CAPSULE | Freq: Three times a day (TID) | ORAL | 0 refills | Status: DC

## 2022-08-25 NOTE — ED Provider Notes (Signed)
[name]    CSN: [id number] Arrival date & time: 08/25/22  1459      History   Chief Complaint Chief Complaint  Patient presents with   Cough    HPI Tracey Todd is a 87 y.o. female.   Patient here today with daughter for evaluation of cough and congestion with low-grade fever she has had for the last 2 days.  She reports that symptoms have seemed to worsen.  She has known congestive heart failure which has daughter concerned.  She has not had any vomiting or diarrhea.  She does have appointment with her cardiologist scheduled for tomorrow.  The history is provided by the patient.  Cough Associated symptoms: fever and headaches   Associated symptoms: no chills, no ear pain, no eye discharge, no shortness of breath, no sore throat and no wheezing     Past Medical History:  Diagnosis Date   Atrial flutter ([place])    a. mentioned in 2016 admission.   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 minimal by cath 2016.   Nodule of chest wall    Normal coronary arteries and LVF 10/23/14 10/24/2014   Pacemaker implanted 10/25/14- St Jude 10/26/2014   Paroxysmal atrial fibrillation ([place])    a. identified on device interrogation, burden low   Pulmonary hypertension ([place])    Sick sinus syndrome ([place])    a. s/p STJ dual chamber PPM    Type II or unspecified type diabetes mellitus without mention of complication, not stated as uncontrolled    Vitamin D deficiency     Patient Active Problem List   Diagnosis Date Noted   Atrial fibrillation ([place]) 08/11/2022   Hav (hallux abducto valgus), unspecified laterality 02/19/2022   Right ear impacted cerumen 11/06/2021   Chronic arthropathy 10/12/2019   Callus 10/12/2019   Long term (current) use of anticoagulants 10/02/2018   Atherosclerosis of aorta ([place])  07/20/2017   Diabetic neuropathy ([place]) 07/20/2017   Pulmonary hypertension, unspecified ([place]) 11/20/2015   BMI 34.0-34.9,adult 08/06/2015   Diabetes mellitus type 2, controlled ([place]) 04/25/2015   Sick sinus syndrome ([place]) 01/29/2015   Pacemaker implanted 10/25/14- St Jude 10/26/2014   Essential hypertension 10/22/2014   PAF-fib and flutter 10/22/2014   Congestive heart failure ([place]) 10/21/2014   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)  ([place]) 10/08/2014   Obesity 07/08/2014   Vitamin D deficiency 09/28/2013   Medication management 09/28/2013   Hyperlipidemia    DJD (degenerative joint disease)    Fatty liver     Past Surgical History:  Procedure Laterality Date   BREAST SURGERY     CATARACT EXTRACTION     LEFT AND RIGHT HEART CATHETERIZATION WITH CORONARY ANGIOGRAM N/A 10/23/2014   Procedure: LEFT AND RIGHT HEART CATHETERIZATION WITH CORONARY ANGIOGRAM;  Surgeon: Blane Ohara, MD;  Location: [facility] C[facility];  Service: Cardiovascular;  Laterality: N/A;   PERMANENT PACEMAKER INSERTION N/A 10/25/2014   STJ dual chamber PPM implanted by Dr Rayann Heman for SSS   TONSILLECTOMY AND ADENOIDECTOMY     TOTAL KNEE ARTHROPLASTY      OB History   No obstetric history on file.      Home Medications    Prior to Admission medications   Medication Sig Start Date End Date Taking? Authorizing Provider  benzonatate (TESSALON) 100 MG capsule Take 1 capsule (100 mg total) by mouth every 8 (eight) hours. 08/25/22  Yes Francene Finders, PA-C  amLODipine (NORVASC) 5 MG tablet Take 1 tablet by mouth once daily 07/19/22   Camillia Herter, NP  atorvastatin (LIPITOR) 40 MG tablet Take 1 tablet (40 mg total) by mouth daily. 11/30/21 08/11/22  Camillia Herter, NP  bisoprolol (ZEBETA) 10 MG tablet Take 1 tablet by mouth once daily for blood pressure 06/23/22   Camillia Herter, NP  Blood Glucose Monitoring Suppl (ONETOUCH VERIO) w/Device KIT Use to check fasting blood sugar daily. Dx: E11.59 08/15/19   Gildardo Pounds,  NP  calcium carbonate (OS-CAL) 600 MG TABS tablet Take 600 mg by mouth daily with breakfast.    [provider]  cetirizine (ZYRTEC) 10 MG tablet Take 10 mg by mouth every other day.  11/28/12   [provider]  Cholecalciferol (VITAMIN D) 2000 UNITS CAPS Take 6,000 Units by mouth daily.     [provider]  dapagliflozin propanediol (FARXIGA) 10 MG TABS tablet Take 1 tablet (10 mg total) by mouth daily before breakfast. 08/10/22   Chandrasekhar, Mahesh A, MD  gabapentin (NEURONTIN) 300 MG capsule Take 1 capsule (300 mg total) by mouth 2 (two) times daily. 02/05/22   Tat, Eustace Quail, DO  glucose blood (ONETOUCH VERIO) test strip USE TO CHECK FASTING BLOOD SUGAR DAILY 08/06/22   Camillia Herter, NP  Lancets [facility] DELICA PLUS [id number]) MISC USE TO CHECK FASTING BLOOD SUGAR DAILY 08/06/22   Camillia Herter, NP  Magnesium 500 MG TABS Take 1,000 mg by mouth in the morning and at bedtime. 2 tabs am, 2 tab pm    [provider]  metFORMIN (GLUCOPHAGE-XR) 500 MG 24 hr tablet Take 1 tablet (500 mg total) by mouth 2 (two) times daily with a meal. 06/30/22 09/28/22  Camillia Herter, NP  spironolactone (ALDACTONE) 25 MG tablet Take 0.5 tablets (12.5 mg total) by mouth daily. 07/19/22   Chandrasekhar, Terisa Starr, MD  torsemide (DEMADEX) 20 MG tablet Take 2 tablets (40 mg total) by mouth 2 (two) times daily. 08/13/22   Werner Lean, MD  warfarin (COUMADIN) 5 MG tablet Take 1.5 tablets daily except 1 tablet on Mondays, Wednesday and Fridays daily or as [place] 06/10/22   Allred, Jeneen Rinks, MD    Family History Family History  Problem Relation Age of Onset   Diabetes Mother    Cancer Mother    Cancer Father     Social History Social History   Tobacco Use   Smoking status: Former    Types: Cigarettes    Quit date: 12/06/1963    Years since quitting: 58.7    Passive exposure: Never   Smokeless tobacco: Never  Vaping Use   Vaping Use:  Never used  Substance Use Topics   Alcohol use: No   Drug use: Never     Allergies   Penicillins, Quinapril, Feldene [piroxicam], Calcium-containing compounds, Celebrex [celecoxib], and Daypro [oxaprozin]   Review of Systems Review of Systems  Constitutional:  Positive for fever. Negative for chills.  HENT:  Positive for congestion. Negative for ear pain and sore throat.   Eyes:  Negative for discharge and redness.  Respiratory:  Positive for cough. Negative for shortness of breath and wheezing.   Gastrointestinal:  Negative for diarrhea, nausea and vomiting.  Neurological:  Positive for headaches.     Physical Exam Triage Vital Signs  ED Triage Vitals  Enc Vitals Group     BP 08/25/22 1843 (!) 156/79     Pulse Rate 08/25/22 1843 68     Resp 08/25/22 1843 20     Temp 08/25/22 1843 99.5 F (37.5 C)     Temp Source 08/25/22 1843 Oral     SpO2 08/25/22 1843 94 %     Weight --      Height --      Head Circumference --      Peak Flow --      Pain Score 08/25/22 1844 0     Pain Loc --      Pain Edu? --      Excl. in [place]? --    No data found.  Updated Vital Signs BP (!) 156/79 (BP Location: Left Arm)   Pulse 68   Temp 99.5 F (37.5 C) (Oral)   Resp 20   SpO2 92%      Physical Exam Vitals and nursing note reviewed.  Constitutional:      General: She is not in acute distress.    Appearance: Normal appearance. She is not ill-appearing.  HENT:     Head: Normocephalic and atraumatic.     Nose: Congestion (mild) present.     Mouth/Throat:     Mouth: Mucous membranes are moist.     Pharynx: No oropharyngeal exudate or posterior oropharyngeal erythema.  Eyes:     Conjunctiva/sclera: Conjunctivae normal.  Cardiovascular:     Rate and Rhythm: Rhythm irregular.  Pulmonary:     Effort: Pulmonary effort is normal. No respiratory distress.     Breath sounds: Normal breath sounds. No wheezing, rhonchi or rales.     Comments: Deep breathing produces cough Skin:     General: Skin is warm and dry.  Neurological:     Mental Status: She is alert.  Psychiatric:        Mood and Affect: Mood normal.        Thought Content: Thought content normal.      UC Treatments / Results  Labs (all labs ordered are listed, but only abnormal results are displayed) Labs Reviewed  SARS CORONAVIRUS 2 (TAT 6-24 HRS)    EKG   Radiology DG Chest 2 View  Result Date: 08/25/2022 CLINICAL DATA:  Cough EXAM: CHEST - 2 VIEW COMPARISON:  11/29/2014 FINDINGS: Transverse diameter of heart is increased. There are no signs of pulmonary edema or focal pulmonary consolidation. There is no pleural effusion or pneumothorax. Pacemaker battery is seen in the left infraclavicular region with tips of leads in right atrium and right ventricle. Degenerative changes are noted in both shoulders. IMPRESSION: Cardiomegaly. There are no signs of pulmonary edema or focal pulmonary consolidation. Electronically Signed   By: Elmer Picker M.D.   On: 08/25/2022 19:18    Procedures Procedures (including critical care time)  Medications Ordered in UC Medications - No data to display  Initial Impression / Assessment and Plan / UC Course  I have reviewed the triage vital signs and the nursing notes.  Pertinent labs & imaging results that were available during my care of the patient were reviewed by me and considered in my medical decision making (see chart for details).    Discussed cardiomegaly on x-ray but no recent x-rays to compare.  Will order COVID screening and Tessalon Perles prescribed for cough.  Advise she keep appointment with cardiologist tomorrow.  Reassuring that x-ray does not have any pleural effusion noted.  Encouraged  follow-up sooner with any worsening symptoms or further concerns.  Final Clinical Impressions(s) / UC Diagnoses   Final diagnoses:  Acute upper respiratory infection  Encounter for screening for COVID-19   Discharge Instructions   None    ED  Prescriptions     Medication Sig Dispense Auth. Provider   benzonatate (TESSALON) 100 MG capsule Take 1 capsule (100 mg total) by mouth every 8 (eight) hours. 21 capsule Francene Finders, PA-C      PDMP not reviewed this encounter.   Francene Finders, PA-C 08/25/22 [phone]

## 2022-08-25 NOTE — Telephone Encounter (Signed)
Chief Complaint: Cough Symptoms: Coughing up gray sputum, nasal congestion, cough severity 5/10, fever 99.6 this morning Frequency: Onset Monday Pertinent Negatives: Patient denies chest pain, SOB Disposition: [] ED /[x] Urgent Care (no appt availability in office) / [] Appointment(In office/virtual)/ []  [facility]/ [] Home Care/ [] Refused Recommended Disposition /[] [facility]/ []  Follow-up with PCP Additional Notes: No availability in the office, called and spoke to [place], [facility] who offered patient an appointment at 1040 on 08/26/22, patient declined due to no transportation. Patient doesn't have email address for virtual UC visit. Advised to go to the UC today or tomorrow, she says she will let her daughter know. Advised she can take coricidin HBP, but she will still need an evaluation due to the colored sputum.    Summary: medication question   Pt is wondering if she can take coricidin for her cough? She has been coughing since Monday night and she has an appointment with her heart doctor tomorrow.         Reason for Disposition  Cough has been present for > 3 weeks  Answer Assessment - Initial Assessment Questions 1. ONSET: "When did the cough begin?"      Monday 2. SEVERITY: "How bad is the cough today?"      5/10 3. SPUTUM: "Describe the color of your sputum" (none, dry cough; clear, white, yellow, green)     Gray 4. HEMOPTYSIS: "Are you coughing up any blood?" If so ask: "How much?" (flecks, streaks, tablespoons, etc.)     No 5. DIFFICULTY BREATHING: "Are you having difficulty breathing?" If Yes, ask: "How bad is it?" (e.g., mild, moderate, severe)    - MILD: No SOB at rest, mild SOB with walking, speaks normally in sentences, can lie down, no retractions, pulse < 100.    - MODERATE: SOB at rest, SOB with minimal exertion and prefers to sit, cannot lie down flat, speaks in phrases, mild retractions, audible wheezing, pulse 100-120.    - SEVERE: Very SOB at  rest, speaks in single words, struggling to breathe, sitting hunched forward, retractions, pulse > 120      No 6. FEVER: "Do you have a fever?" If Yes, ask: "What is your temperature, how was it measured, and when did it start?"     Yes 99.6 this morning 7. LUNG HISTORY: "Do you have any history of lung disease?"  (e.g., pulmonary embolus, asthma, emphysema)     No 8. OTHER SYMPTOMS: "Do you have any other symptoms?" (e.g., runny nose, wheezing, chest pain)      Nasal congestion  Protocols used: Cough - Acute Productive-A-AH

## 2022-08-25 NOTE — Progress Notes (Unsigned)
Office Visit    Patient Name: Tracey Todd Date of Encounter: 08/26/2022  Primary Care Provider:  Camillia Herter, NP Primary Cardiologist:  Werner Lean, MD Primary Electrophysiologist: [name], MD  Chief Complaint    ALAISHA Todd is a 87 y.o. female with PMH of HFpEF, pulmonary hypertension, CKD stage IIIa, persistent atrial flutter, HTN, HLD, SSS s/p Saint Jude PPM, DM, minimal CAD who presents today for 2-week follow-up of HFpEF and lower extremity swelling.  Past Medical History    Past Medical History:  Diagnosis Date   Atrial flutter ([place])    a. mentioned in 2016 admission.   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 minimal by cath 2016.   Nodule of chest wall    Normal coronary arteries and LVF 10/23/14 10/24/2014   Pacemaker implanted 10/25/14- St Jude 10/26/2014   Paroxysmal atrial fibrillation (HCC)    a. identified on device interrogation, burden low   Pulmonary hypertension (HCC)    Sick sinus syndrome ([place])    a. s/p STJ dual chamber PPM    Type II or unspecified type diabetes mellitus without mention of complication, not stated as uncontrolled    Vitamin D deficiency    Past Surgical History:  Procedure Laterality Date   BREAST SURGERY     CATARACT EXTRACTION     LEFT AND RIGHT HEART CATHETERIZATION WITH CORONARY ANGIOGRAM N/A 10/23/2014   Procedure: LEFT AND RIGHT HEART CATHETERIZATION WITH CORONARY ANGIOGRAM;  Surgeon: Blane Ohara, MD;  Location: [facility] CATH LAB;  Service: Cardiovascular;  Laterality: N/A;   PERMANENT PACEMAKER INSERTION N/A 10/25/2014   STJ dual chamber PPM implanted by Dr Rayann Heman for SSS   TONSILLECTOMY AND ADENOIDECTOMY     TOTAL KNEE ARTHROPLASTY      Allergies  Allergies  Allergen Reactions   Penicillins Anaphylaxis   Quinapril      Angioedema    Feldene [Piroxicam] Other (See Comments)    Bleeding    Calcium-Containing Compounds Other (See Comments)    "calcium deposits on skin"   Celebrex [Celecoxib] Itching   Daypro [Oxaprozin] Itching    History of Present Illness    Tracey Todd  is a 87 year old female with the above mention past medical history who presents today for 2-week follow-up of HFpEF and lower extremity swelling.  Tracey Todd was initially seen in 2013 for complaint of dyspnea and palpitations.  She underwent ischemic evaluation by Leane Call that was normal.  She was seen in the ED on 09/2014 for NSTEMI with new onset HF and bradycardia.  She underwent LHC by Dr. Burt Knack that revealed minimal nonobstructive CAD.  2D echo was completed with EF of 55-60% with no RWMA and mild AV and MV regurgitation with mildly dilated LA and RA with moderate TV regurgitation and moderately increased pulmonary pressure of 62 mmHg.  She was consult by EP and found to have SSS and had [facility] PPM placed on 10/25/2014.  She has been followed by the AF clinic for management of atrial flutter/fib with most recent appointment on 02/2022.  She is currently managed with warfarin and is on bisoprolol for rate control.  She was seen initially by Dr. Gasper Sells on 07/19/2022 and reported doing poorly.  She noted new  lower extremity edema with shortness of breath and dyspnea on exertion.  2D echo was updated that revealed EF of 60-65% with LVH and mildly reduced RV systolic function with mildly elevated PA systolic pressure and severe tricuspid regurgitation. Lasix was increased and MRA and SGLT2 were started.  She was seen in follow-up 12/13 and reported feeling more fatigue and tiredness.  She also had new wheezing noted.  She was transition to torsemide 40 mg p.o. twice daily and advised to seek care in the ED however patient would like to avoid if possible.  RHC would be recommended if patient is euvolemic for evaluation of  pulmonary hypertension  Tracey Todd presents today for complaint of cough and increased congestion.  Since last being seen in the office patient reports that she has developed a worsening cough with congestion and low-grade fever.  She was seen in the ED yesterday for workup and.  Chest x-ray was completed showing no signs of edema or consolidation.  She was given Ladona Ridgel and discharged with instructions to follow-up with cardiology.  She presents today with her daughter and reports that she is feeling much better and cough has decreased with Tessalon.  She is euvolemic today and weight is down significantly from previous visit at 14 pounds.  She reports full compliance with her current medications denies any adverse reactions.  She has noticed a significant change in her lower extremity edema with torsemide.  She will have labs drawn today at the end of this visit to evaluate renal function.  Patient's last creatinine was 1.21 on 12/8 and potassium was 4.6.  Patient denies chest pain, palpitations, dyspnea, PND, orthopnea, nausea, vomiting, dizziness, syncope, edema, weight gain, or early satiety.  Home Medications    Current Outpatient Medications  Medication Sig Dispense Refill   amLODipine (NORVASC) 5 MG tablet Take 1 tablet by mouth once daily 90 tablet 0   benzonatate (TESSALON) 100 MG capsule Take 1 capsule (100 mg total) by mouth every 8 (eight) hours. 21 capsule 0   bisoprolol (ZEBETA) 10 MG tablet Take 1 tablet by mouth once daily for blood pressure 90 tablet 0   Blood Glucose Monitoring Suppl (ONETOUCH VERIO) w/Device KIT Use to check fasting blood sugar daily. Dx: E11.59 1 kit 0   calcium carbonate (OS-CAL) 600 MG TABS tablet Take 600 mg by mouth daily with breakfast.     cetirizine (ZYRTEC) 10 MG tablet Take 10 mg by mouth every other day.      Cholecalciferol (VITAMIN D) 2000 UNITS CAPS Take 6,000 Units by mouth daily.      dapagliflozin propanediol (FARXIGA) 10 MG TABS tablet  Take 1 tablet (10 mg total) by mouth daily before breakfast. 90 tablet 3   gabapentin (NEURONTIN) 300 MG capsule Take 1 capsule (300 mg total) by mouth 2 (two) times daily. 180 capsule 1   glucose blood (ONETOUCH VERIO) test strip USE TO CHECK FASTING BLOOD SUGAR DAILY 100 each 11   Lancets (ONETOUCH DELICA PLUS NUUVOZ36U) MISC USE TO CHECK FASTING BLOOD SUGAR DAILY 100 each 2   Magnesium 500 MG TABS Take 1,000 mg by mouth in the morning and at bedtime. 2 tabs am, 2 tab pm     metFORMIN (GLUCOPHAGE-XR) 500 MG 24 hr tablet Take 1 tablet (500 mg total) by mouth 2 (two) times daily with a meal. 60 tablet 2   spironolactone (ALDACTONE) 25 MG tablet Take 0.5 tablets (12.5 mg total) by mouth daily. 45 tablet 3   torsemide (DEMADEX)  20 MG tablet Take 2 tablets (40 mg total) by mouth 2 (two) times daily. 360 tablet 3   warfarin (COUMADIN) 5 MG tablet Take 1.5 tablets daily except 1 tablet on Mondays, Wednesday and Fridays daily or as DIRECTED BY ANTICOAGULATION  CLINIC 40 tablet 3   atorvastatin (LIPITOR) 40 MG tablet Take 1 tablet (40 mg total) by mouth daily. 90 tablet 1   No current facility-administered medications for this visit.     Review of Systems  Please see the history of present illness.    (+) Cough and congestion (+) Shortness of breath with minimal exertion  All other systems reviewed and are otherwise negative except as noted above.  Physical Exam    Wt Readings from Last 3 Encounters:  08/26/22 186 lb 9.6 oz (84.6 kg)  08/11/22 200 lb 9.6 oz (91 kg)  08/06/22 200 lb (90.7 kg)   VS: Vitals:   08/26/22 1448  BP: (!) 96/58  Pulse: 64  SpO2: 97%  ,Body mass index is 40.38 kg/m.  Constitutional:      Appearance: Healthy appearance. Not in distress.  Neck:     Vascular: JVD normal.  Pulmonary:     Effort: Pulmonary effort is normal.     Breath sounds: No wheezing. No rales. Diminished in the bases Cardiovascular:     Normal rate. Regular rhythm. Normal S1. Normal S2.       Murmurs: There is no murmur.  Edema:    Peripheral edema absent.  Abdominal:     Palpations: Abdomen is soft non tender. There is no hepatomegaly.  Skin:    General: Skin is warm and dry.  Neurological:     General: No focal deficit present.     Mental Status: Alert and oriented to person, place and time.     Cranial Nerves: Cranial nerves are intact.  EKG/LABS/Other Studies Reviewed    ECG personally reviewed by me today -none completed today  Risk Assessment/Calculations:    CHA2DS2-VASc Score = 7   This indicates a 11.2% annual risk of stroke. The patient's score is based upon: CHF History: 1 HTN History: 1 Diabetes History: 1 Stroke History: 0 Vascular Disease History: 1 Age Score: 2 Gender Score: 1           Lab Results  Component Value Date   WBC 6.9 08/06/2022   HGB 11.6 08/06/2022   HCT 36.9 08/06/2022   MCV 87 08/06/2022   PLT 231 08/06/2022   Lab Results  Component Value Date   CREATININE 1.21 (H) 08/06/2022   BUN 23 08/06/2022   NA 148 (H) 08/06/2022   K 4.6 08/06/2022   CL 107 (H) 08/06/2022   CO2 21 08/06/2022   Lab Results  Component Value Date   ALT 20 08/06/2022   AST 27 08/06/2022   ALKPHOS 60 08/06/2022   BILITOT 0.6 08/06/2022   Lab Results  Component Value Date   CHOL 194 11/30/2021   HDL 59 11/30/2021   LDLCALC 113 (H) 11/30/2021   TRIG 122 11/30/2021   CHOLHDL 3.3 11/30/2021    Lab Results  Component Value Date   HGBA1C 7.9 (A) 06/30/2022    Assessment & Plan    1.  Dyspnea on exertion: -Patient seen by Dr. Gasper Sells with complaint of ongoing dyspnea on exertion.  She was switched to torsemide 40 mg p.o. twice daily and started on Farxiga.  Today she is 16 pounds down from previous visit but is dealing with congestion and frequent cough.  She is still having some shortness of breath with minimal exertion but this has improved slightly since previous visit. -She is scheduled to have BMET completed today and based  on renal function adjustments will be made to diuretics at that time.  2.  HFpEF: -Patient's last 2D echo was completed on 08/06/2022 with EF of 60-65% with LVH and mildly reduced RV systolic function with mildly elevated PA systolic pressure and severe tricuspid regurgitation.  -During today's visit I discussed the plan of care outlined by Dr. Harmon Pier regarding possibility of completing right heart catheterization in the near future.  I reviewed consent with the patient and her daughter and they are in agreement to proceed if volume status improved at upcoming visit. -Continue GDMT with spironolactone 12.5 mg,Farxiga 10 mg and adjustment to diuretic will be made pending results of BMET  3.  Atrial flutter/fib: -Patient currently on rate controlled and tolerating bisoprolol 10 mg -Coumadin 7.5 mg per Coumadin clinic   4.  History of SSS: -s/p Saint Jude PPM -Paceart report completed 06/01/2022 with normal battery and lead parameters.  5.  Pulmonary hypertension: -2D echo completed with significant tricuspid regurgitation noted and moderately elevated pulmonary artery pressure. -Continue GDMT with spironolactone, Farxiga, torsemide as noted above   Disposition: Follow-up with Werner Lean, MD or APP in as scheduled months Shared Decision Making/Informed Consent The risks [stroke (1 in 1000), death (1 in 16), kidney failure [usually temporary] (1 in 500), bleeding (1 in 200), allergic reaction [possibly serious] (1 in 200)], benefits (diagnostic support and management of coronary artery disease) and alternatives of a cardiac catheterization were discussed in detail with Ms. Nanni and she is willing to proceed.   Medication Adjustments/Labs and Tests Ordered: Current medicines are reviewed at length with the patient today.  Concerns regarding medicines are outlined above.   Signed, Mable Fill, Marissa Nestle, NP 08/26/2022, 5:41 PM [facility]  Note:  This document was prepared using Dragon voice recognition software and may include unintentional dictation errors.

## 2022-08-25 NOTE — Telephone Encounter (Signed)
Called pt.

## 2022-08-25 NOTE — ED Triage Notes (Signed)
Pt caregiver c/o cough. States yesterday was feeling worse could not get in with PCP but pt felt she needed to be evaluated. Concerned for her heart failure. States she sees cardiology tomorrow. Associated headache, tickle in throat.   Onset ~ Monday.

## 2022-08-25 NOTE — Progress Notes (Unsigned)
Virtual Visit via Telephone Note  I connected with Tracey Todd, on 08/25/2022 at 9:35 PM by telephone and verified that I am speaking with the correct person using two identifiers.  Consent: I discussed the limitations, risks, security and privacy concerns of performing an evaluation and management service by telephone and the availability of in person appointments. I also discussed with the patient that there may be a patient responsible charge related to this service. The patient expressed understanding and agreed to proceed.   Location of Patient: Home  Location of Provider: [facility]   Persons participating in Telemedicine visit: Randel Pigg, NP Margorie John, CMA   History of Present Illness: Tracey Todd is a 87 y.o. female who presents for cough and phlegm.   08/25/2022  [facility] per PA note: Discussed cardiomegaly on x-ray but no recent x-rays to compare. Will order COVID screening and Tessalon Perles prescribed for cough. Advise she keep appointment with cardiologist tomorrow. Reassuring that x-ray does not have any pleural effusion noted. Encouraged follow-up sooner with any worsening symptoms or further concerns.   Today's visit 08/26/2022:   Past Medical History:  Diagnosis Date   Atrial flutter (HCC)    a. mentioned in 2016 admission.   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 minimal by cath 2016.   Nodule of chest wall    Normal coronary arteries and LVF 10/23/14 10/24/2014   Pacemaker implanted 10/25/14- St Jude 10/26/2014   Paroxysmal atrial fibrillation (HCC)    a. identified on device interrogation, burden low   Pulmonary hypertension (HCC)    Sick sinus syndrome (HCC)    a. s/p STJ dual chamber PPM    Type II or  unspecified type diabetes mellitus without mention of complication, not stated as uncontrolled    Vitamin D deficiency    Allergies  Allergen Reactions   Penicillins Anaphylaxis   Quinapril     Angioedema    Feldene [Piroxicam] Other (See Comments)    Bleeding    Calcium-Containing Compounds Other (See Comments)    "calcium deposits on skin"   Celebrex [Celecoxib] Itching   Daypro [Oxaprozin] Itching    Current Outpatient Medications on File Prior to Visit  Medication Sig Dispense Refill   amLODipine (NORVASC) 5 MG tablet Take 1 tablet by mouth once daily 90 tablet 0   atorvastatin (LIPITOR) 40 MG tablet Take 1 tablet (40 mg total) by mouth daily. 90 tablet 1   benzonatate (TESSALON) 100 MG capsule Take 1 capsule (100 mg total) by mouth every 8 (eight) hours. 21 capsule 0   bisoprolol (ZEBETA) 10 MG tablet Take 1 tablet by mouth once daily for blood pressure 90 tablet 0   Blood Glucose Monitoring Suppl (ONETOUCH VERIO) w/Device KIT Use to check fasting blood sugar daily. Dx: E11.59 1 kit 0   calcium carbonate (OS-CAL) 600 MG TABS tablet Take 600 mg by mouth daily with breakfast.     cetirizine (ZYRTEC) 10 MG tablet Take 10 mg by mouth every other day.      Cholecalciferol (VITAMIN D) 2000 UNITS CAPS Take 6,000 Units by mouth daily.      dapagliflozin propanediol (FARXIGA) 10 MG TABS tablet Take 1 tablet (10 mg total) by mouth  daily before breakfast. 90 tablet 3   gabapentin (NEURONTIN) 300 MG capsule Take 1 capsule (300 mg total) by mouth 2 (two) times daily. 180 capsule 1   glucose blood (ONETOUCH VERIO) test strip USE TO CHECK FASTING BLOOD SUGAR DAILY 100 each 11   Lancets (ONETOUCH DELICA PLUS LANCET33G) MISC USE TO CHECK FASTING BLOOD SUGAR DAILY 100 each 2   Magnesium 500 MG TABS Take 1,000 mg by mouth in the morning and at bedtime. 2 tabs am, 2 tab pm     metFORMIN (GLUCOPHAGE-XR) 500 MG 24 hr tablet Take 1 tablet (500 mg total) by mouth 2 (two) times daily with a meal. 60  tablet 2   spironolactone (ALDACTONE) 25 MG tablet Take 0.5 tablets (12.5 mg total) by mouth daily. 45 tablet 3   torsemide (DEMADEX) 20 MG tablet Take 2 tablets (40 mg total) by mouth 2 (two) times daily. 360 tablet 3   warfarin (COUMADIN) 5 MG tablet Take 1.5 tablets daily except 1 tablet on Mondays, Wednesday and Fridays daily or as DIRECTED BY ANTICOAGULATION  CLINIC 40 tablet 3   No current facility-administered medications on file prior to visit.    Observations/Objective: Alert and oriented x 3. Not in acute distress. Physical examination not completed as this is a telemedicine visit.  Assessment and Plan: ***  Follow Up Instructions: ***   Patient was given clear instructions to go to Emergency Department or return to medical center if symptoms don't improve, worsen, or new problems develop.The patient verbalized understanding.  I discussed the assessment and treatment plan with the patient. The patient was provided an opportunity to ask questions and all were answered. The patient agreed with the plan and demonstrated an understanding of the instructions.   The patient was advised to call back or seek an in-person evaluation if the symptoms worsen or if the condition fails to improve as anticipated.     I provided *** minutes total of non-face-to-face time during this encounter.   Rema Fendt, NP  [facility] at [facility] [place], [place] [phone] 08/25/2022, 9:35 PM

## 2022-08-26 ENCOUNTER — Encounter: Payer: Medicare Other | Admitting: Family

## 2022-08-26 ENCOUNTER — Ambulatory Visit (INDEPENDENT_AMBULATORY_CARE_PROVIDER_SITE_OTHER): Payer: Medicare Other

## 2022-08-26 ENCOUNTER — Ambulatory Visit: Payer: Medicare Other | Attending: Internal Medicine

## 2022-08-26 ENCOUNTER — Ambulatory Visit (INDEPENDENT_AMBULATORY_CARE_PROVIDER_SITE_OTHER): Payer: Medicare Other | Admitting: Nurse Practitioner

## 2022-08-26 ENCOUNTER — Other Ambulatory Visit: Payer: Medicare Other

## 2022-08-26 ENCOUNTER — Encounter: Payer: Self-pay | Admitting: Nurse Practitioner

## 2022-08-26 VITALS — BP 96/58 | HR 64 | Ht <= 58 in | Wt 186.6 lb

## 2022-08-26 DIAGNOSIS — I495 Sick sinus syndrome: Secondary | ICD-10-CM

## 2022-08-26 DIAGNOSIS — I272 Pulmonary hypertension, unspecified: Secondary | ICD-10-CM

## 2022-08-26 DIAGNOSIS — I480 Paroxysmal atrial fibrillation: Secondary | ICD-10-CM | POA: Diagnosis not present

## 2022-08-26 DIAGNOSIS — I509 Heart failure, unspecified: Secondary | ICD-10-CM | POA: Diagnosis not present

## 2022-08-26 DIAGNOSIS — R0609 Other forms of dyspnea: Secondary | ICD-10-CM

## 2022-08-26 DIAGNOSIS — I5040 Unspecified combined systolic (congestive) and diastolic (congestive) heart failure: Secondary | ICD-10-CM

## 2022-08-26 LAB — CUP PACEART REMOTE DEVICE CHECK
Battery Remaining Longevity: 26 mo
Battery Remaining Percentage: 23 %
Battery Voltage: 2.93 V
Brady Statistic AP VP Percent: 0 %
Brady Statistic AP VS Percent: 0 %
Brady Statistic AS VP Percent: 0 %
Brady Statistic AS VS Percent: 0 %
Brady Statistic RA Percent Paced: 1 %
Brady Statistic RV Percent Paced: 99 %
Date Time Interrogation Session: 20231228114201
Implantable Lead Connection Status: 753985
Implantable Lead Connection Status: 753985
Implantable Lead Implant Date: 20160226
Implantable Lead Implant Date: 20160226
Implantable Lead Location: 753859
Implantable Lead Location: 753860
Implantable Lead Model: 1948
Implantable Pulse Generator Implant Date: 20160226
Lead Channel Impedance Value: 430 Ohm
Lead Channel Impedance Value: 580 Ohm
Lead Channel Pacing Threshold Amplitude: 0.625 V
Lead Channel Pacing Threshold Amplitude: 0.75 V
Lead Channel Pacing Threshold Pulse Width: 0.4 ms
Lead Channel Pacing Threshold Pulse Width: 0.4 ms
Lead Channel Sensing Intrinsic Amplitude: 10.4 mV
Lead Channel Sensing Intrinsic Amplitude: 3.1 mV
Lead Channel Setting Pacing Amplitude: 1.625
Lead Channel Setting Pacing Amplitude: 2.5 V
Lead Channel Setting Pacing Pulse Width: 0.4 ms
Lead Channel Setting Sensing Sensitivity: 2 mV
Pulse Gen Model: 2240
Pulse Gen Serial Number: 7700661

## 2022-08-26 NOTE — Patient Instructions (Signed)
Medication Instructions:   Your physician recommends that you continue on your current medications as directed. Please refer to the Current Medication list given to you today.  *If you need a refill on your cardiac medications before your next appointment, please call your pharmacy*   Lab Work: NONE ORDERED  TODAY   If you have labs (blood work) drawn today and your tests are completely normal, you will receive your results only by: MyChart Message (if you have MyChart) OR A paper copy in the mail If you have any lab test that is abnormal or we need to change your treatment, we will call you to review the results.   Testing/Procedures: NONE ORDERED  TODAY    Follow-Up: At [facility], you and your health needs are our priority.  As part of our continuing mission to provide you with exceptional heart care, we have created designated Provider Care Teams.  These Care Teams include your primary Cardiologist (physician) and Advanced Practice Providers (APPs -  Physician Assistants and Nurse Practitioners) who all work together to provide you with the care you need, when you need it.  We recommend signing up for the patient portal called "MyChart".  Sign up information is provided on this After Visit Summary.  MyChart is used to connect with patients for Virtual Visits (Telemedicine).  Patients are able to view lab/test results, encounter notes, upcoming appointments, etc.  Non-urgent messages can be sent to your provider as well.   To learn more about what you can do with MyChart, go to [email or web address].    Your next appointment:    AS SCHEDULED    The format for your next appointment:   In Person  Provider:   Eligha Bridegroom, NP        Other Instructions   Important Information About Sugar

## 2022-08-26 NOTE — Progress Notes (Signed)
Pt presents for telemedicine visit for cold symptoms -states she has cough and congestion

## 2022-08-27 ENCOUNTER — Ambulatory Visit (INDEPENDENT_AMBULATORY_CARE_PROVIDER_SITE_OTHER): Payer: Medicare Other | Admitting: Podiatry

## 2022-08-27 ENCOUNTER — Encounter: Payer: Self-pay | Admitting: Podiatry

## 2022-08-27 ENCOUNTER — Ambulatory Visit
Admission: RE | Admit: 2022-08-27 | Discharge: 2022-08-27 | Disposition: A | Discharge status: Home / Self Care | Payer: Medicare Other | Source: Ambulatory Visit | Attending: Family | Admitting: Family

## 2022-08-27 ENCOUNTER — Other Ambulatory Visit: Payer: Self-pay

## 2022-08-27 DIAGNOSIS — Z1231 Encounter for screening mammogram for malignant neoplasm of breast: Secondary | ICD-10-CM | POA: Diagnosis not present

## 2022-08-27 DIAGNOSIS — M79676 Pain in unspecified toe(s): Secondary | ICD-10-CM

## 2022-08-27 DIAGNOSIS — M2010 Hallux valgus (acquired), unspecified foot: Secondary | ICD-10-CM | POA: Diagnosis not present

## 2022-08-27 DIAGNOSIS — E1142 Type 2 diabetes mellitus with diabetic polyneuropathy: Secondary | ICD-10-CM

## 2022-08-27 DIAGNOSIS — D689 Coagulation defect, unspecified: Secondary | ICD-10-CM | POA: Diagnosis not present

## 2022-08-27 DIAGNOSIS — B351 Tinea unguium: Secondary | ICD-10-CM | POA: Diagnosis not present

## 2022-08-27 DIAGNOSIS — I5040 Unspecified combined systolic (congestive) and diastolic (congestive) heart failure: Secondary | ICD-10-CM

## 2022-08-27 LAB — BASIC METABOLIC PANEL
BUN/Creatinine Ratio: 31 — ABNORMAL HIGH (ref 12–28)
BUN: 41 mg/dL — ABNORMAL HIGH (ref 8–27)
CO2: 24 mmol/L (ref 20–29)
Calcium: 9.6 mg/dL (ref 8.7–10.3)
Chloride: 93 mmol/L — ABNORMAL LOW (ref 96–106)
Creatinine, Ser: 1.34 mg/dL — ABNORMAL HIGH (ref 0.57–1.00)
Glucose: 173 mg/dL — ABNORMAL HIGH (ref 70–99)
Potassium: 4.7 mmol/L (ref 3.5–5.2)
Sodium: 135 mmol/L (ref 134–144)
eGFR: 39 mL/min/{1.73_m2} — ABNORMAL LOW (ref >59)

## 2022-08-27 LAB — SARS CORONAVIRUS 2 (TAT 6-24 HRS): SARS Coronavirus 2: NEGATIVE

## 2022-08-27 NOTE — Progress Notes (Signed)
This patient returns to my office for at risk foot care.  This patient requires this care by a professional since this patient will be at risk due to having diabetes and coagulation defect.  Patient is taking coumadin.  This patient is unable to cut nails herself since the patient cannot reach her nails.These nails are painful walking and wearing shoes.  This patient presents for at risk foot care today.  General Appearance  Alert, conversant and in no acute stress.  Vascular  Dorsalis pedis and posterior tibial  pulses are weakly  palpable  bilaterally.  Capillary return is within normal limits  bilaterally. Temperature is within normal limits  Bilaterally. Absent hair.  Neurologic  Senn-Weinstein monofilament wire test within normal limits  bilaterally. Muscle power within normal limits bilaterally.  Nails Thick disfigured discolored nails with subungual debris  from hallux to fifth toes bilaterally. No evidence of bacterial infection or drainage bilaterally.  Orthopedic  No limitations of motion  feet .  No crepitus or effusions noted.  No bony pathology or digital deformities noted.  Skin  normotropic skin with no porokeratosis noted bilaterally.  No signs of infections or ulcers noted.     Onychomycosis  Pain in right toes  Pain in left toes  Consent was obtained for treatment procedures.   Mechanical debridement of nails 1-5  bilaterally performed with a nail nipper. No dremel usage.  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   Return office visit   3 months                   Told patient to return for periodic foot care and evaluation due to potential at risk complications.   Percy Winterrowd DPM  

## 2022-08-31 ENCOUNTER — Ambulatory Visit (INDEPENDENT_AMBULATORY_CARE_PROVIDER_SITE_OTHER): Payer: BLUE CROSS/BLUE SHIELD | Admitting: Family

## 2022-08-31 ENCOUNTER — Ambulatory Visit: Payer: Self-pay | Admitting: *Deleted

## 2022-08-31 ENCOUNTER — Encounter: Payer: Self-pay | Admitting: Family

## 2022-08-31 VITALS — BP 116/72 | HR 64 | Temp 98.3°F | Resp 16 | Ht <= 58 in

## 2022-08-31 DIAGNOSIS — M79676 Pain in unspecified toe(s): Secondary | ICD-10-CM

## 2022-08-31 DIAGNOSIS — J069 Acute upper respiratory infection, unspecified: Secondary | ICD-10-CM | POA: Diagnosis not present

## 2022-08-31 DIAGNOSIS — M2010 Hallux valgus (acquired), unspecified foot: Secondary | ICD-10-CM

## 2022-08-31 DIAGNOSIS — R0989 Other specified symptoms and signs involving the circulatory and respiratory systems: Secondary | ICD-10-CM | POA: Diagnosis not present

## 2022-08-31 DIAGNOSIS — R062 Wheezing: Secondary | ICD-10-CM

## 2022-08-31 DIAGNOSIS — R059 Cough, unspecified: Secondary | ICD-10-CM

## 2022-08-31 DIAGNOSIS — Z0289 Encounter for other administrative examinations: Secondary | ICD-10-CM

## 2022-08-31 DIAGNOSIS — B351 Tinea unguium: Secondary | ICD-10-CM

## 2022-08-31 MED ORDER — DOXYCYCLINE HYCLATE 100 MG PO TABS: 100.0000 mg | ORAL_TABLET | Freq: Every day | ORAL | 0 refills | Status: AC

## 2022-08-31 MED ORDER — ALBUTEROL SULFATE HFA 108 (90 BASE) MCG/ACT IN AERS: 2.0000 | INHALATION_SPRAY | Freq: Four times a day (QID) | RESPIRATORY_TRACT | 1 refills | Status: DC | PRN

## 2022-08-31 NOTE — Progress Notes (Signed)
Patient ID: Tracey Todd, female    DOB: 1934-12-08  MRN: [id number]  CC: Upper Respiratory Symptoms  Subjective: Tracey Todd is a 88 y.o. female who presents for upper respiratory symptoms. She is accompanied by her daughter, Tracey Todd.   Her concerns today include:  08/25/2022 [facility] per PA note: I have reviewed the triage vital signs and the nursing notes.   Pertinent labs & imaging results that were available during my care of the patient were reviewed by me and considered in my medical decision making (see chart for details).   Discussed cardiomegaly on x-ray but no recent x-rays to compare.  Will order COVID screening and Tessalon Perles prescribed for cough.  Advise she keep appointment with cardiologist tomorrow.  Reassuring that x-ray does not have any pleural effusion noted.  Encouraged follow-up sooner with any worsening symptoms or further concerns.   08/31/2022 per triage RN note:   Chief Complaint: cough. wheezing Symptoms: cough, wheezing Frequency: 1 week Pertinent Negatives: Patient denies fever, chest pain Disposition: _0 ED /_1 Urgent Care (no appt availability in office) / _2 Appointment(In office/virtual)/ _3  [facility]/ _4 Home Care/ _5 Refused Recommended Disposition /_6 [facility]/ _7  Follow-up with PCP Additional Notes: Patient advised monitor O2 sat- if too low has to go to ED- -or if gets worse in any way      Summary: wheezing    Pt called to see if she could get an antibiotic / she was seen recently in ED but stated she wasn't given an antibiotic / pt stated her wheezing is getting worse / NT line was not available / pt was schedule for 2:20 appt / please advise          Reason for Disposition  [1] MILD difficulty breathing (e.g., minimal/no SOB at rest, SOB with walking, pulse <100) AND [2] NEW-onset or WORSE than normal  Answer Assessment - Initial Assessment Questions 1. RESPIRATORY STATUS:  "Describe your breathing?" (e.g., wheezing, shortness of breath, unable to speak, severe coughing)      Cough,SOB- using inhaler- does help 2. ONSET: "When did this breathing problem begin?"      [facility]- last Wednesday evening- 1 week 3. PATTERN "Does the difficult breathing come and go, or has it been constant since it started?"      Out of breath with talking- comes and goes 4. SEVERITY: "How bad is your breathing?" (e.g., mild, moderate, severe)    - MILD: No SOB at rest, mild SOB with walking, speaks normally in sentences, can lie down, no retractions, pulse < 100.    - MODERATE: SOB at rest, SOB with minimal exertion and prefers to sit, cannot lie down flat, speaks in phrases, mild retractions, audible wheezing, pulse 100-120.    - SEVERE: Very SOB at rest, speaks in single words, struggling to breathe, sitting hunched forward, retractions, pulse > 120      Mild/moderate 5. RECURRENT SYMPTOM: "Have you had difficulty breathing before?" If Yes, ask: "When was the last time?" and "What happened that time?"      Yes- usually gets sinus infection- and needs breathing treatments 6. CARDIAC HISTORY: "Do you have any history of heart disease?" (e.g., heart attack, angina, bypass surgery, angioplasty)      CHF 7. LUNG HISTORY: "Do you have any history of lung disease?"  (e.g., pulmonary embolus, asthma, emphysema)     no 8. CAUSE: "What do you think is causing the breathing problem?"  infection 9. OTHER SYMPTOMS: "Do you have any other symptoms? (e.g., dizziness, runny nose, cough, chest pain, fever)     Cough, nasal congestion, dizziness 10. O2 SATURATION MONITOR:  "Do you use an oxygen saturation monitor (pulse oximeter) at home?" If Yes, ask: "What is your reading (oxygen level) today?" "What is your usual oxygen saturation reading?" (e.g., 95%)       Usually 91%- has to do deep breathing  12. TRAVEL: "Have you traveled out of the country in the last month?" (e.g., travel history,  exposures)       no  Today's visit 08/31/2022: Patient's report consistent with triage RN noted call from 08/31/2022. Upper respiratory symptoms persisting. Tessalon Perles from Urgent Care visit not helping. Patient wants to try an antibiotic to see if it helps. Daughter reports notices wheezing only at nighttime when laying flat. Once patient sits up wheezing resolves. Patient was last seen by her cardiologist on 08/26/2022. Patient's daughter reports the cardiologist does not think the wheezing is heart-related. Patient denies red flag symptoms. She does have intermittent headaches with no red flag symptoms. Blood sugars mid 200's to low 300's. Patient states she is monitoring what she eats. She was referred to Endocrinology on 08/17/2022 per her request. Mild hyperpigmentation of bilateral lower extremities with intermittent itching. Tried over-the-counter Cortisone cream. Patient and patient's daughter decline referral to Dermatology as of present. They are aware to follow-up with me as scheduled. She is established with Gardiner Barefoot, DPM. Last appointment was 08/27/2022. Reports her toenails were trimmed down at that appointment. Callus of left great toe was left because patient states it is not causing present symptoms. She has paperwork from [place] [facility] [place] to be completed by an MD/DO so that she can receive diabetic footwear.   Patient Active Problem List   Diagnosis Date Noted   Atrial fibrillation ([place]) 08/11/2022   Hav (hallux abducto valgus), unspecified laterality 02/19/2022   Right ear impacted cerumen 11/06/2021   Chronic arthropathy 10/12/2019   Callus 10/12/2019   Long term (current) use of anticoagulants 10/02/2018   Atherosclerosis of aorta ([place]) 07/20/2017   Diabetic neuropathy ([place]) 07/20/2017   Pulmonary hypertension, unspecified ([place]) 11/20/2015   BMI 34.0-34.9,adult 08/06/2015   Diabetes mellitus type 2, controlled ([place]) 04/25/2015   Sick sinus syndrome ([place])  01/29/2015   Pacemaker implanted 10/25/14- [employer] Jude 10/26/2014   Essential hypertension 10/22/2014   PAF-fib and flutter 10/22/2014   Congestive heart failure ([place]) 10/21/2014   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 90 tablet 1   benzonatate (TESSALON) 100 MG capsule Take 1 capsule (100 mg total) by mouth every 8 (eight) hours. 21 capsule 0   bisoprolol (ZEBETA) 10 MG tablet Take 1 tablet by mouth once daily for blood pressure 90 tablet 0   Blood Glucose Monitoring Suppl (ONETOUCH VERIO) w/Device KIT Use to check fasting blood sugar daily. Dx: E11.59 1 kit 0   calcium carbonate (OS-CAL) 600 MG TABS tablet Take 600 mg by mouth daily with breakfast.  cetirizine (ZYRTEC) 10 MG tablet Take 10 mg by mouth every other day.      Cholecalciferol (VITAMIN D) 2000 UNITS CAPS Take 6,000 Units by mouth daily.      dapagliflozin propanediol (FARXIGA) 10 MG TABS tablet Take 1 tablet (10 mg total) by mouth daily before breakfast. 90 tablet 3   gabapentin (NEURONTIN) 300 MG capsule Take 1 capsule (300 mg total) by mouth 2 (two) times daily. 180 capsule 1   glucose blood (ONETOUCH VERIO) test strip USE TO CHECK FASTING BLOOD SUGAR DAILY 100 each 11   Lancets (ONETOUCH DELICA PLUS [id number]) MISC USE TO CHECK FASTING BLOOD SUGAR DAILY 100 each 2   Magnesium 500 MG TABS Take 1,000 mg by mouth in the morning and at bedtime. 2 tabs am, 2 tab pm     metFORMIN (GLUCOPHAGE-XR) 500 MG 24 hr tablet Take 1 tablet (500 mg total) by mouth 2 (two) times daily with a meal. 60 tablet 2    spironolactone (ALDACTONE) 25 MG tablet Take 0.5 tablets (12.5 mg total) by mouth daily. 45 tablet 3   torsemide (DEMADEX) 20 MG tablet Take 2 tablets (40 mg total) by mouth 2 (two) times daily. 360 tablet 3   warfarin (COUMADIN) 5 MG tablet Take 1.5 tablets daily except 1 tablet on Mondays, Wednesday and Fridays daily or as [place] 40 tablet 3   No current facility-administered medications on file prior to visit.    Allergies  Allergen Reactions   Penicillins Anaphylaxis   Quinapril     Angioedema    Feldene [Piroxicam] Other (See Comments)    Bleeding    Calcium-Containing Compounds Other (See Comments)    "calcium deposits on skin"   Celebrex [Celecoxib] Itching   Daypro [Oxaprozin] Itching    Social History   Socioeconomic History   Marital status: Married    Spouse name: Not on file   Number of children: Not on file   Years of education: Not on file   Highest education level: Not on file  Occupational History   Not on file  Tobacco Use   Smoking status: Former    Types: Cigarettes    Quit date: 12/06/1963    Years since quitting: 58.7    Passive exposure: Never   Smokeless tobacco: Never  Vaping Use   Vaping Use: Never used  Substance and Sexual Activity   Alcohol use: No   Drug use: Never   Sexual activity: Not Currently  Other Topics Concern   Not on file  Social History Narrative   Left Handed    Lives in a one story home    Social Determinants of Health   Financial Resource Strain: Low Risk  (05/28/2021)   Overall Financial Resource Strain (CARDIA)    Difficulty of Paying Living Expenses: Not very hard  Food Insecurity: No Food Insecurity (05/28/2021)   Hunger Vital Sign    Worried About Running Out of Food in the Last Year: Never true    [place] in the Last Year: Never true  Transportation Needs: No Transportation Needs (05/28/2021)   PRAPARE - Hydrologist (Medical): No    Lack of  Transportation (Non-Medical): No  Physical Activity: Inactive (05/28/2021)   Exercise Vital Sign    Days of Exercise per Week: 0 days    Minutes of Exercise per Session: 0 min  Stress: No Stress Concern Present (05/28/2021)   [place] -  Occupational Stress Questionnaire    Feeling of Stress : Not at all  Social Connections: Moderately Integrated (05/28/2021)   Social Connection and Isolation Panel [NHANES]    Frequency of Communication with Friends and Family: More than three times a week    Frequency of Social Gatherings with Friends and Family: More than three times a week    Attends Religious Services: More than 4 times per year    Active Member of [employer] or Organizations: No    Attends Archivist Meetings: Never    Marital Status: Married  Human resources officer Violence: Not At Risk (05/28/2021)   Humiliation, Afraid, Rape, and Kick questionnaire    Fear of Current or Ex-Partner: No    Emotionally Abused: No    Physically Abused: No    Sexually Abused: No    Family History  Problem Relation Age of Onset   Diabetes Mother    Cancer Mother    Cancer Father    Breast cancer Neg Hx     Past Surgical History:  Procedure Laterality Date   BREAST SURGERY     CATARACT EXTRACTION     LEFT AND RIGHT HEART CATHETERIZATION WITH CORONARY ANGIOGRAM N/A 10/23/2014   Procedure: LEFT AND RIGHT HEART CATHETERIZATION WITH CORONARY ANGIOGRAM;  Surgeon: Blane Ohara, MD;  Location: [facility] CATH LAB;  Service: Cardiovascular;  Laterality: N/A;   PERMANENT PACEMAKER INSERTION N/A 10/25/2014   STJ dual chamber PPM implanted by Dr Rayann Heman for SSS   TONSILLECTOMY AND ADENOIDECTOMY     TOTAL KNEE ARTHROPLASTY      ROS: Review of Systems Negative except as stated above  PHYSICAL EXAM: BP 116/72 (BP Location: Left Arm, Patient Position: Sitting, Cuff Size: Large)   Pulse 64   Temp 98.3 F (36.8 C)   Resp 16   Ht 4' 9.01" (1.448 m)   SpO2 91%   BMI 40.37  kg/m   Physical Exam HENT:     Head: Normocephalic and atraumatic.     Right Ear: Tympanic membrane, ear canal and external ear normal.     Left Ear: Tympanic membrane, ear canal and external ear normal.     Nose: Nose normal.     Mouth/Throat:     Mouth: Mucous membranes are moist.     Pharynx: Oropharynx is clear.  Eyes:     Extraocular Movements: Extraocular movements intact.     Conjunctiva/sclera: Conjunctivae normal.     Pupils: Pupils are equal, round, and reactive to light.  Cardiovascular:     Rate and Rhythm: Normal rate and regular rhythm.     Pulses: Normal pulses.     Heart sounds: Normal heart sounds.  Pulmonary:     Breath sounds: Wheezing present.     Comments: Mild wheezes left lower lung.  Musculoskeletal:     Cervical back: Normal range of motion and neck supple.  Feet:     Right foot:     Skin integrity: Skin integrity normal.     Toenail Condition: Right toenails are abnormally thick. Fungal disease present.    Left foot:     Skin integrity: Callus present.     Toenail Condition: Left toenails are abnormally thick. Fungal disease present. Neurological:     General: No focal deficit present.     Mental Status: She is alert and oriented to person, place, and time.  Psychiatric:        Mood and Affect: Mood normal.        Behavior: Behavior  normal.    ASSESSMENT AND PLAN: 1. Upper respiratory symptom 2. Upper respiratory tract infection, unspecified type 3. Cough, unspecified type 4. Wheezing - Patient today in office with no cardiopulmonary distress.  - Respiratory panel pending.  - Doxycyline and Albuterol inhaler as prescribed. Counseled on medication adherence/adverse effects.  - Follow-up with primary provider as scheduled. - COVID-19, Flu A+B and RSV - albuterol (VENTOLIN HFA) 108 (90 Base) MCG/ACT inhaler; Inhale 2 puffs into the lungs every 6 (six) hours as needed for wheezing or shortness of breath.  Dispense: 8 g; Refill: 1 - doxycycline  (VIBRA-TABS) 100 MG tablet; Take 1 tablet (100 mg total) by mouth daily for 3 days.  Dispense: 3 tablet; Refill: 0  5. Encounter for completion of form with patient 6. Pain due to onychomycosis of toenail 7. Valgus deformity of great toe, unspecified laterality -  Patient presents with paperwork from [place] and [place] to be completed by an MD/DO so that she can receive diabetic footwear.  Dorna Mai, MD to complete for patient.    Patient was given the opportunity to ask questions.  Patient verbalized understanding of the plan and was able to repeat key elements of the plan. Patient was given clear instructions to go to Emergency Department or return to medical center if symptoms don't improve, worsen, or new problems develop.The patient verbalized understanding.   Orders Placed This Encounter  Procedures   COVID-19, Flu A+B and RSV     Requested Prescriptions   Signed Prescriptions Disp Refills   albuterol (VENTOLIN HFA) 108 (90 Base) MCG/ACT inhaler 8 g 1    Sig: Inhale 2 puffs into the lungs every 6 (six) hours as needed for wheezing or shortness of breath.   doxycycline (VIBRA-TABS) 100 MG tablet 3 tablet 0    Sig: Take 1 tablet (100 mg total) by mouth daily for 3 days.    Follow-up with primary provider as scheduled.   Camillia Herter, NP

## 2022-08-31 NOTE — Telephone Encounter (Signed)
Summary: wheezing   Pt called to see if she could get an antibiotic / she was seen recently in ED but stated she wasn't given an antibiotic / pt stated her wheezing is getting worse / NT line was not available / pt was schedule for 2:20 appt / please advise         Reason for Disposition  [1] MILD difficulty breathing (e.g., minimal/no SOB at rest, SOB with walking, pulse <100) AND [2] NEW-onset or WORSE than normal  Answer Assessment - Initial Assessment Questions 1. RESPIRATORY STATUS: "Describe your breathing?" (e.g., wheezing, shortness of breath, unable to speak, severe coughing)      Cough,SOB- using inhaler- does help 2. ONSET: "When did this breathing problem begin?"      [facility]- last Wednesday evening- 1 week 3. PATTERN "Does the difficult breathing come and go, or has it been constant since it started?"      Out of breath with talking- comes and goes 4. SEVERITY: "How bad is your breathing?" (e.g., mild, moderate, severe)    - MILD: No SOB at rest, mild SOB with walking, speaks normally in sentences, can lie down, no retractions, pulse < 100.    - MODERATE: SOB at rest, SOB with minimal exertion and prefers to sit, cannot lie down flat, speaks in phrases, mild retractions, audible wheezing, pulse 100-120.    - SEVERE: Very SOB at rest, speaks in single words, struggling to breathe, sitting hunched forward, retractions, pulse > 120      Mild/moderate 5. RECURRENT SYMPTOM: "Have you had difficulty breathing before?" If Yes, ask: "When was the last time?" and "What happened that time?"      Yes- usually gets sinus infection- and needs breathing treatments 6. CARDIAC HISTORY: "Do you have any history of heart disease?" (e.g., heart attack, angina, bypass surgery, angioplasty)      CHF 7. LUNG HISTORY: "Do you have any history of lung disease?"  (e.g., pulmonary embolus, asthma, emphysema)     no 8. CAUSE: "What do you think is causing the breathing problem?"      infection 9. OTHER  SYMPTOMS: "Do you have any other symptoms? (e.g., dizziness, runny nose, cough, chest pain, fever)     Cough, nasal congestion, dizziness 10. O2 SATURATION MONITOR:  "Do you use an oxygen saturation monitor (pulse oximeter) at home?" If Yes, ask: "What is your reading (oxygen level) today?" "What is your usual oxygen saturation reading?" (e.g., 95%)       Usually 91%- has to do deep breathing  12. TRAVEL: "Have you traveled out of the country in the last month?" (e.g., travel history, exposures)       no  Protocols used: Breathing Difficulty-A-AH

## 2022-08-31 NOTE — Progress Notes (Signed)
Cardiology Office Note:    Date:  09/03/2022   ID:  Tracey Todd, DOB [birth year], MRN [id number]  PCP:  Camillia Herter, NP   [facility] Providers Cardiologist:  Werner Lean, MD Cardiology APP:  Patsey Berthold, NP (Inactive)  Electrophysiologist:  [name], MD     Referring MD: Camillia Herter, NP   Chief Complaint: follow-up HFpEF  History of Present Illness:    Tracey Todd is a very pleasant 88 y.o. female with a hx of HFpEF, pulmonary hypertension, CKD stage IIIa, persistent atrial flutter, HTN, HLD, SSS s/p Saint Jude PPM, diabetes, and obesity.   She was initially seen in 2013 for dyspnea and palpitations.  She underwent ischemic evaluation by Leane Call that was normal.  Seen in the ED 09/2014 for NSTEMI with new onset heart failure and bradycardia.  She underwent left heart catheterization by Dr. Burt Knack that revealed minimal nonobstructive CAD.  2D echo revealed EF 55 to 60%, mild AV and MV regurgitation with mildly dilated LA and RA with moderate TV regurgitation and moderately increased pulmonary pressure of 62 mmHg.  Consult by EP and found to have SSS s/p [facility] 10/25/2014.  She has been followed by A-fib clinic for management of atrial flutter/fib.  Currently managed with warfarin and bisoprolol for rate control.  Initially seen by Dr. Glenford Bayley 07/19/2022 and reported doing poorly.  She noted new lower extremity edema with shortness of breath and dyspnea on exertion.  2D echo was updated which revealed EF 60 to 65% with LVH and mildly reduced RV systolic function with mildly elevated PA systolic pressure and severe tricuspid regurgitation.  Lasix was increased and MRA and SGLT2 were started.  Seen in follow-up 12/13 and reported feeling more fatigued.  She also had new wheezing noted. She was transition to torsemide 40 mg twice daily and advised to seek care in the ED however patient wanted to avoid admission.   Seen in follow-up by  Ambrose Pancoast, NP on 08/26/2022. She reported worsening cough with congestion and low-grade fever since previous visit. Seen in ED the day before with CXR that showed no signs of edema or consolidation.  Was given Ladona Ridgel and discharged with instructions to follow-up with cardiology.  She reported feeling better since starting [employer] Weight down 14 pounds and she was feeling better with improvement in LE edema.  Plan to seek right heart catheterization for evaluation of PHTN in the near future if volume status continues to improve. Following results of BMP with Scr increased to 1.34, she was advised to take torsemide 20 mg every other day.   Today, she is here with her daughter [name]. She reports she continues to have cough and congestion, was diagnosed with RSV last week at Urgent Care. Has some shortness of breath with exertion. Has been taking torsemide 20 mg every other day. Weight at home has been stable at 184.2 lb. Bilateral leg swelling has improved, however she continues to feel some tightness.  She denies orthopnea, PND, chest pain, presyncope, syncope.  Ambulates with a rolling walker. Home BP consistently similar to what we got today.   Past Medical History:  Diagnosis Date   Atrial flutter ([place])    a. mentioned in 2016 admission.   Chronic diastolic CHF (congestive heart failure) (HCC)    DJD (degenerative joint disease)    Fatty liver    H/O total knee replacement 02/13/2013   HTN (hypertension)    Hypercholesteremia  Mild CAD    a. minimal by cath 2016.   Nodule of chest wall    Normal coronary arteries and LVF 10/23/14 10/24/2014   Pacemaker implanted 10/25/14- St Jude 10/26/2014   Paroxysmal atrial fibrillation (HCC)    a. identified on device interrogation, burden low   Pulmonary hypertension (HCC)    Sick sinus syndrome ([place])    a. s/p STJ dual chamber PPM    Type II or unspecified type diabetes mellitus without mention of complication, not stated as  uncontrolled    Vitamin D deficiency     Past Surgical History:  Procedure Laterality Date   BREAST SURGERY     CATARACT EXTRACTION     LEFT AND RIGHT HEART CATHETERIZATION WITH CORONARY ANGIOGRAM N/A 10/23/2014   Procedure: LEFT AND RIGHT HEART CATHETERIZATION WITH CORONARY ANGIOGRAM;  Surgeon: Blane Ohara, MD;  Location[facility] C[facility] LAB;  Service: Cardiovascular;  Laterality: N/A;   PERMANENT PACEMAKER INSERTION N/A 10/25/2014   STJ dual chamber PPM implanted by Dr Rayann Heman for SSS   TONSILLECTOMY AND ADENOIDECTOMY     TOTAL KNEE ARTHROPLASTY      Current Medications: Current Meds  Medication Sig   albuterol (VENTOLIN HFA) 108 (90 Base) MCG/ACT inhaler Inhale 2 puffs into the lungs every 6 (six) hours as needed for wheezing or shortness of breath.   amLODipine (NORVASC) 5 MG tablet Take 1 tablet by mouth once daily   benzonatate (TESSALON) 100 MG capsule Take 1 capsule (100 mg total) by mouth every 8 (eight) hours.   bisoprolol (ZEBETA) 10 MG tablet Take 1 tablet by mouth once daily for blood pressure   Blood Glucose Monitoring Suppl (ONETOUCH VERIO) w/Device KIT Use to check fasting blood sugar daily. Dx: E11.59   calcium carbonate (OS-CAL) 600 MG TABS tablet Take 600 mg by mouth daily with breakfast.   cetirizine (ZYRTEC) 10 MG tablet Take 10 mg by mouth every other day.    Cholecalciferol (VITAMIN D) 2000 UNITS CAPS Take 6,000 Units by mouth daily.    dapagliflozin propanediol (FARXIGA) 10 MG TABS tablet Take 1 tablet (10 mg total) by mouth daily before breakfast.   doxycycline (VIBRA-TABS) 100 MG tablet Take 1 tablet (100 mg total) by mouth daily for 3 days.   gabapentin (NEURONTIN) 300 MG capsule Take 1 capsule (300 mg total) by mouth 2 (two) times daily.   glucose blood (ONETOUCH VERIO) test strip USE TO CHECK FASTING BLOOD SUGAR DAILY   Lancets (ONETOUCH DELICA PLUS [id number]) MISC USE TO CHECK FASTING BLOOD SUGAR DAILY   Magnesium 500 MG TABS Take 1,000 mg by mouth in the  morning and at bedtime. 2 tabs am, 2 tab pm   metFORMIN (GLUCOPHAGE-XR) 500 MG 24 hr tablet Take 1 tablet (500 mg total) by mouth 2 (two) times daily with a meal.   spironolactone (ALDACTONE) 25 MG tablet Take 0.5 tablets (12.5 mg total) by mouth daily.   torsemide (DEMADEX) 20 MG tablet Take 2 tablets (40 mg total) by mouth 2 (two) times daily.   warfarin (COUMADIN) 5 MG tablet Take 1.5 tablets daily except 1 tablet on Mondays, Wednesday and Fridays daily or as DIRECTED BY ANTICOAGULATION  CLINIC     Allergies:   Penicillins, Quinapril, Feldene [piroxicam], Calcium-containing compounds, Celebrex [celecoxib], and Daypro [oxaprozin]   Social History   Socioeconomic History   Marital status: Married    Spouse name: Not on file   Number of children: Not on file   Years of education: Not on file  Highest education level: Not on file  Occupational History   Not on file  Tobacco Use   Smoking status: Former    Types: Cigarettes    Quit date: 12/06/1963    Years since quitting: 58.7    Passive exposure: Never   Smokeless tobacco: Never  Vaping Use   Vaping Use: Never used  Substance and Sexual Activity   Alcohol use: No   Drug use: Never   Sexual activity: Not Currently  Other Topics Concern   Not on file  Social History Narrative   Left Handed    Lives in a one story home    Social Determinants of Health   Financial Resource Strain: Low Risk  (05/28/2021)   Overall Financial Resource Strain (CARDIA)    Difficulty of Paying Living Expenses: Not very hard  Food Insecurity: No Food Insecurity (05/28/2021)   Hunger Vital Sign    Worried About Running Out of Food in the Last Year: Never true    Ran Out of Food in the Last Year: Never true  Transportation Needs: No Transportation Needs (05/28/2021)   PRAPARE - Administrator, Civil Service (Medical): No    Lack of Transportation (Non-Medical): No  Physical Activity: Inactive (05/28/2021)   Exercise Vital Sign    Days of  Exercise per Week: 0 days    Minutes of Exercise per Session: 0 min  Stress: No Stress Concern Present (05/28/2021)   [employer] of Occupational Health - Occupational Stress Questionnaire    Feeling of Stress : Not at all  Social Connections: Moderately Integrated (05/28/2021)   Social Connection and Isolation Panel [NHANES]    Frequency of Communication with Friends and Family: More than three times a week    Frequency of Social Gatherings with Friends and Family: More than three times a week    Attends Religious Services: More than 4 times per year    Active Member of [employer] or Organizations: No    Attends Banker Meetings: Never    Marital Status: Married     Family History: The patient's family history includes Cancer in her father and mother; Diabetes in her mother. There is no history of Breast cancer.  ROS:   Please see the history of present illness.    + bilateral LE edema + shortness of breath All other systems reviewed and are negative.  Labs/Other Studies Reviewed:    The following studies were reviewed today:  Echo 08/06/22 1. Left ventricular ejection fraction, by estimation, is 60 to 65%. The  left ventricle has normal function. Left ventricular endocardial border  not optimally defined to evaluate regional wall motion. There is mild left  ventricular hypertrophy of the  basal-septal segment. Left ventricular diastolic function could not be  evaluated.   2. Right ventricular systolic function is mildly reduced. The right  ventricular size is normal. There is moderately elevated pulmonary artery  systolic pressure. The estimated right ventricular systolic pressure is  56.5 mmHg.   3. Left atrial size was mildly dilated.   4. Right atrial size was moderately dilated.   5. The mitral valve is normal in structure. Mild mitral valve  regurgitation.   6. The tricuspid valve is abnormal. Tricuspid valve regurgitation is  severe.   7. The aortic  valve is tricuspid. There is mild calcification of the  aortic valve. There is mild thickening of the aortic valve. Aortic valve  regurgitation is not visualized. Aortic valve sclerosis/calcification is  present, without  any evidence of  aortic stenosis.    Recent Labs: 08/06/2022: ALT 20; Hemoglobin 11.6; NT-Pro BNP 2,368; Platelets 231; TSH 2.710 08/26/2022: BUN 41; Creatinine, Ser 1.34; Potassium 4.7; Sodium 135  Recent Lipid Panel    Component Value Date/Time   CHOL 194 11/30/2021 0937   TRIG 122 11/30/2021 0937   HDL 59 11/30/2021 0937   CHOLHDL 3.3 11/30/2021 0937   CHOLHDL 2.5 06/02/2018 0950   VLDL 15 03/28/2017 0907   LDLCALC 113 (H) 11/30/2021 0937   LDLCALC 86 06/02/2018 0950     Risk Assessment/Calculations:    CHA2DS2-VASc Score = 7  This indicates a 11.2% annual risk of stroke. The patient's score is based upon: CHF History: 1 HTN History: 1 Diabetes History: 1 Stroke History: 0 Vascular Disease History: 1 Age Score: 2 Gender Score: 1    Physical Exam:    VS:  BP 116/65   Pulse 65   Ht 4' 10.8" (1.494 m)   Wt 189 lb 9.6 oz (86 kg)   SpO2 98%   BMI 38.56 kg/m     Wt Readings from Last 3 Encounters:  09/03/22 189 lb 9.6 oz (86 kg)  08/26/22 186 lb 9.6 oz (84.6 kg)  08/11/22 200 lb 9.6 oz (91 kg)     GEN: Obese, well developed in no acute distress HEENT: Normal NECK: No JVD; No carotid bruits CARDIAC: Irregular RR, no murmurs, rubs, gallops RESPIRATORY:  Clear to auscultation without rales, wheezing or rhonchi  ABDOMEN: Soft, non-tender, non-distended MUSCULOSKELETAL:  No edema; No deformity. 2=+  pedal pulses, equal bilaterally SKIN: Warm and dry NEUROLOGIC:  Alert and oriented x 3 PSYCHIATRIC:  Normal affect   EKG:  EKG is not ordered today.  The ekg ordered today demonstrates    Diagnoses:    1. Chronic heart failure with preserved ejection fraction (HFpEF) (HCC)   2. Persistent atrial fibrillation (HCC)   3. Pulmonary hypertension,  unspecified (HCC)   4. Dyspnea on exertion   5. Long term (current) use of anticoagulants   6. Sick sinus syndrome (HCC)   7. Respiratory infection   8. Pacemaker implanted 10/25/14- St Jude    Assessment and Plan:     Chronic HFpEF: LVEF 60 to 65% with LVH, mildly reduced RV systolic function, mildly elevated PA systolic pressure and severe TR on echo 08/06/2022. Stable edema and weight. Dyspnea improved. No orthopnea, PND. She had angioedema on quinapril so we will avoid ACE/ARB/ARNI. Tolerating medications without side effects or other concerns. Continue GDMT including spironolactone 12.5 mg, Farxiga 10 mg, and torsemide 20 mg every other day.  Will check BMP today.  Respiratory infection: Continues to recover from RSV with mild cough and congestion. Feels that her shortness of breath is improving.   Pulmonary hypertension: Moderately elevated pulmonary artery systolic pressure, mildly reduced RV systolic function with RVSP 56.5 mmHg, severe TR on echo 08/06/22.  She feels that her breathing has improved since she has lost fluid weight. Weight is stable, edema is improved. No orthopnea, PND. Continues to recover from RSV with improvement in dyspnea since last office visit. Through shared decision making with patient and daughter, we will follow-up in 3 months for consideration of right heart catheterization.  Persistent atrial flutter/fib: Persistent AF, burden 100% on remote device check 08/26/22.  No palpitations, tachycardia to her awareness, she is overall asymptomatic. Continue AC with Coumadin managed by our coumadin clinic.  Continue rate control with bisoprolol.  History of SSS: S/p PPM implant 2016. Normal device  function on remote device check 08/26/2022. 100% burden AF with controlled rates.  No concerns today.  Is due for appointment with EP provider, we will schedule today.      Disposition: 1 month with EP provider/3 months with Dr. Izora Ribas  Medication Adjustments/Labs and  Tests Ordered: Current medicines are reviewed at length with the patient today.  Concerns regarding medicines are outlined above.  Orders Placed This Encounter  Procedures   Basic Metabolic Panel (BMET)   No orders of the defined types were placed in this encounter.   Patient Instructions  Medication Instructions:   Your physician recommends that you continue on your current medications as directed. Please refer to the Current Medication list given to you today.   *If you need a refill on your cardiac medications before your next appointment, please call your pharmacy*   Lab Work:  TODAY!!! BMET  If you have labs (blood work) drawn today and your tests are completely normal, you will receive your results only by: MyChart Message (if you have MyChart) OR A paper copy in the mail If you have any lab test that is abnormal or we need to change your treatment, we will call you to review the results.   Testing/Procedures:  None ordered.   Follow-Up: At [facility], you and your health needs are our priority.  As part of our continuing mission to provide you with exceptional heart care, we have created designated Provider Care Teams.  These Care Teams include your primary Cardiologist (physician) and Advanced Practice Providers (APPs -  Physician Assistants and Nurse Practitioners) who all work together to provide you with the care you need, when you need it.  We recommend signing up for the patient portal called "MyChart".  Sign up information is provided on this After Visit Summary.  MyChart is used to connect with patients for Virtual Visits (Telemedicine).  Patients are able to view lab/test results, encounter notes, upcoming appointments, etc.  Non-urgent messages can be sent to your provider as well.   To learn more about what you can do with MyChart, go to [email or web address].    Your next appointment:   3 month(s)  The format for your next appointment:   In  Person  Provider:   Christell Constant, MD   Francis Dowse, PA   Important Information About Sugar         Signed, Levi Aland, NP  09/03/2022 4:39 PM     [facility]

## 2022-08-31 NOTE — Progress Notes (Signed)
Pt presents for cold symptoms that won't go away -states that tessalon Perles are not working  -pt has shoes removed, diabetic footwear paperwork came by fax to office 12/29

## 2022-08-31 NOTE — Telephone Encounter (Signed)
  Chief Complaint: cough. wheezing Symptoms: cough, wheezing Frequency: 1 week Pertinent Negatives: Patient denies fever, chest pain Disposition: [] ED /[] Urgent Care (no appt availability in office) / [x] Appointment(In office/virtual)/ []  [facility]/ [] Home Care/ [] Refused Recommended Disposition /[] [facility]/ []  Follow-up with PCP Additional Notes: Patient advised monitor O2 sat- if too low has to go to ED- -or if gets worse in any way

## 2022-09-01 LAB — COVID-19, FLU A+B AND RSV
Influenza A, NAA: NOT DETECTED
Influenza B, NAA: NOT DETECTED
RSV, NAA: DETECTED — AB
SARS-CoV-2, NAA: NOT DETECTED

## 2022-09-02 ENCOUNTER — Ambulatory Visit: Payer: Self-pay

## 2022-09-02 NOTE — Telephone Encounter (Signed)
Pls advise.  

## 2022-09-02 NOTE — Telephone Encounter (Signed)
Chief Complaint: Calling for lab results Symptoms: still coughing, mucus is some better Frequency: N/A Pertinent Negatives: Patient denies N/A Disposition: [] ED /[] Urgent Care (no appt availability in office) / [] Appointment(In office/virtual)/ []  [facility]/ [x] Home Care/ [] Refused Recommended Disposition /[] [place] [facility]/ []  Follow-up with PCP Additional Notes: Results given per notes of [name], NP on 09/02/22, patient verbalized understanding. She asks does she need to take any more antibiotics, she finished the 3rd day today. She says the mucus is still coming up, some better. Advised I will send this to Amy, continue using inhaler every 6 hours prn wheezing or SOB, advised to go to the ED over the weekend if no improvement overall, she verbalized understanding.   Camillia Herter, NP 09/02/2022  7:34 AM EST     Call patient with update. - Covid and flu negative. - RSV positive. This is a virus. Continue with present management. For worsening or severe symptoms call 911/report to the emergency department for immediate medical evaluation.    Summary: Pt needs labs results of +RSV given to her vs her daughter needs clarification   Pls fu with pt. She was given a call back on labs by her daughter and told she was positive RSV but her daughter is not really telling her what this means or what she isto do. Pt really wants to talk to the nurse herself/ Pls fu at [phone]     Reason for Disposition  Nursing judgment  Protocols used: No Guideline or Reference Available-A-AH

## 2022-09-02 NOTE — Telephone Encounter (Signed)
No additional antibiotics needed at this time. Continue with present management. For worsening or severe symptoms call 911/report to the emergency department for immediate medical evaluation.

## 2022-09-03 ENCOUNTER — Encounter: Payer: Self-pay | Admitting: Nurse Practitioner

## 2022-09-03 ENCOUNTER — Ambulatory Visit: Payer: BLUE CROSS/BLUE SHIELD | Attending: Nurse Practitioner | Admitting: Nurse Practitioner

## 2022-09-03 VITALS — BP 116/65 | HR 65 | Ht 58.8 in | Wt 189.6 lb

## 2022-09-03 DIAGNOSIS — I4819 Other persistent atrial fibrillation: Secondary | ICD-10-CM

## 2022-09-03 DIAGNOSIS — R0609 Other forms of dyspnea: Secondary | ICD-10-CM

## 2022-09-03 DIAGNOSIS — I5032 Chronic diastolic (congestive) heart failure: Secondary | ICD-10-CM

## 2022-09-03 DIAGNOSIS — Z950 Presence of cardiac pacemaker: Secondary | ICD-10-CM

## 2022-09-03 DIAGNOSIS — J988 Other specified respiratory disorders: Secondary | ICD-10-CM

## 2022-09-03 DIAGNOSIS — I5040 Unspecified combined systolic (congestive) and diastolic (congestive) heart failure: Secondary | ICD-10-CM | POA: Diagnosis not present

## 2022-09-03 DIAGNOSIS — Z7901 Long term (current) use of anticoagulants: Secondary | ICD-10-CM

## 2022-09-03 DIAGNOSIS — I480 Paroxysmal atrial fibrillation: Secondary | ICD-10-CM | POA: Diagnosis not present

## 2022-09-03 DIAGNOSIS — I272 Pulmonary hypertension, unspecified: Secondary | ICD-10-CM | POA: Diagnosis not present

## 2022-09-03 DIAGNOSIS — I495 Sick sinus syndrome: Secondary | ICD-10-CM

## 2022-09-03 NOTE — Telephone Encounter (Signed)
Spoke to patient.  She is aware that she has RSV.  Denies fever, SOB or worsening sx's.  She states she uses the inhaler that was prescribed every 6 hours.   Advised she does not need additional ATBs.  She can continue with present management per PCP.   Patient verbalized understanding.

## 2022-09-03 NOTE — Patient Instructions (Signed)
Medication Instructions:   Your physician recommends that you continue on your current medications as directed. Please refer to the Current Medication list given to you today.   *If you need a refill on your cardiac medications before your next appointment, please call your pharmacy*   Lab Work:  TODAY!!! BMET  If you have labs (blood work) drawn today and your tests are completely normal, you will receive your results only by: [place] (if you have MyChart) OR A paper copy in the mail If you have any lab test that is abnormal or we need to change your treatment, we will call you to review the results.   Testing/Procedures:  None ordered.   Follow-Up: At [facility], you and your health needs are our priority.  As part of our continuing mission to provide you with exceptional heart care, we have created designated Provider Care Teams.  These Care Teams include your primary Cardiologist (physician) and Advanced Practice Providers (APPs -  Physician Assistants and Nurse Practitioners) who all work together to provide you with the care you need, when you need it.  We recommend signing up for the patient portal called "MyChart".  Sign up information is provided on this After Visit Summary.  MyChart is used to connect with patients for Virtual Visits (Telemedicine).  Patients are able to view lab/test results, encounter notes, upcoming appointments, etc.  Non-urgent messages can be sent to your provider as well.   To learn more about what you can do with MyChart, go to [email or web address].    Your next appointment:   3 month(s)  The format for your next appointment:   In Person  Provider:   Werner Lean, MD   Tommye Standard, PA   Important Information About Sugar

## 2022-09-04 LAB — BASIC METABOLIC PANEL
BUN/Creatinine Ratio: 18 (ref 12–28)
BUN: 18 mg/dL (ref 8–27)
CO2: 21 mmol/L (ref 20–29)
Calcium: 9.6 mg/dL (ref 8.7–10.3)
Chloride: 100 mmol/L (ref 96–106)
Creatinine, Ser: 0.98 mg/dL (ref 0.57–1.00)
Glucose: 123 mg/dL — ABNORMAL HIGH (ref 70–99)
Potassium: 4.7 mmol/L (ref 3.5–5.2)
Sodium: 138 mmol/L (ref 134–144)
eGFR: 57 mL/min/{1.73_m2} — ABNORMAL LOW (ref >59)

## 2022-09-07 DIAGNOSIS — E1141 Type 2 diabetes mellitus with diabetic mononeuropathy: Secondary | ICD-10-CM | POA: Diagnosis not present

## 2022-09-13 NOTE — Progress Notes (Signed)
Remote pacemaker transmission.   

## 2022-09-14 ENCOUNTER — Other Ambulatory Visit: Payer: Self-pay

## 2022-09-14 ENCOUNTER — Ambulatory Visit: Payer: BLUE CROSS/BLUE SHIELD | Attending: Internal Medicine | Admitting: *Deleted

## 2022-09-14 DIAGNOSIS — Z7901 Long term (current) use of anticoagulants: Secondary | ICD-10-CM | POA: Diagnosis not present

## 2022-09-14 DIAGNOSIS — E1141 Type 2 diabetes mellitus with diabetic mononeuropathy: Secondary | ICD-10-CM

## 2022-09-14 DIAGNOSIS — I480 Paroxysmal atrial fibrillation: Secondary | ICD-10-CM

## 2022-09-14 LAB — POCT INR: INR: 4.4 — AB (ref 2.0–3.0)

## 2022-09-14 MED ORDER — ONETOUCH DELICA PLUS LANCET33G MISC: 2 refills | Status: AC

## 2022-09-14 MED ORDER — ONETOUCH VERIO VI STRP: ORAL_STRIP | 11 refills | Status: AC

## 2022-09-14 NOTE — Patient Instructions (Signed)
Description   Do not take any warfarin today and no warfarin tomorrow then continue taking 1 tablet daily except 1.5 tablets on Sundays, Tuesday, and Thursdays. Continue eating 2 serving of greens per week plus your one day of brussels sprouts. Recheck INR in 2 weeks. Coumadin Clinic [phone]

## 2022-09-14 NOTE — Progress Notes (Signed)
Updated test strips and lancets so pt can receive refill. Pt is out due to being sick and having to check blood sugars regularly due to medication regimen. Updated test strips and lancets to testing 2x daily

## 2022-09-23 DIAGNOSIS — Z950 Presence of cardiac pacemaker: Secondary | ICD-10-CM | POA: Diagnosis not present

## 2022-09-23 DIAGNOSIS — Z7984 Long term (current) use of oral hypoglycemic drugs: Secondary | ICD-10-CM | POA: Diagnosis not present

## 2022-09-23 DIAGNOSIS — E669 Obesity, unspecified: Secondary | ICD-10-CM | POA: Diagnosis not present

## 2022-09-23 DIAGNOSIS — I495 Sick sinus syndrome: Secondary | ICD-10-CM | POA: Diagnosis not present

## 2022-09-23 DIAGNOSIS — N1831 Chronic kidney disease, stage 3a: Secondary | ICD-10-CM | POA: Diagnosis not present

## 2022-09-23 DIAGNOSIS — I130 Hypertensive heart and chronic kidney disease with heart failure and stage 1 through stage 4 chronic kidney disease, or unspecified chronic kidney disease: Secondary | ICD-10-CM | POA: Diagnosis not present

## 2022-09-23 DIAGNOSIS — I4892 Unspecified atrial flutter: Secondary | ICD-10-CM | POA: Diagnosis not present

## 2022-09-23 DIAGNOSIS — Z794 Long term (current) use of insulin: Secondary | ICD-10-CM | POA: Diagnosis not present

## 2022-09-23 DIAGNOSIS — M1990 Unspecified osteoarthritis, unspecified site: Secondary | ICD-10-CM | POA: Diagnosis not present

## 2022-09-23 DIAGNOSIS — Z6841 Body Mass Index (BMI) 40.0 and over, adult: Secondary | ICD-10-CM | POA: Diagnosis not present

## 2022-09-23 DIAGNOSIS — K760 Fatty (change of) liver, not elsewhere classified: Secondary | ICD-10-CM | POA: Diagnosis not present

## 2022-09-23 DIAGNOSIS — Z87891 Personal history of nicotine dependence: Secondary | ICD-10-CM | POA: Diagnosis not present

## 2022-09-23 DIAGNOSIS — Z9181 History of falling: Secondary | ICD-10-CM | POA: Diagnosis not present

## 2022-09-23 DIAGNOSIS — I083 Combined rheumatic disorders of mitral, aortic and tricuspid valves: Secondary | ICD-10-CM | POA: Diagnosis not present

## 2022-09-23 DIAGNOSIS — I252 Old myocardial infarction: Secondary | ICD-10-CM | POA: Diagnosis not present

## 2022-09-23 DIAGNOSIS — E7800 Pure hypercholesterolemia, unspecified: Secondary | ICD-10-CM | POA: Diagnosis not present

## 2022-09-23 DIAGNOSIS — E1122 Type 2 diabetes mellitus with diabetic chronic kidney disease: Secondary | ICD-10-CM | POA: Diagnosis not present

## 2022-09-23 DIAGNOSIS — Z96659 Presence of unspecified artificial knee joint: Secondary | ICD-10-CM | POA: Diagnosis not present

## 2022-09-23 DIAGNOSIS — Z7901 Long term (current) use of anticoagulants: Secondary | ICD-10-CM | POA: Diagnosis not present

## 2022-09-23 DIAGNOSIS — I2510 Atherosclerotic heart disease of native coronary artery without angina pectoris: Secondary | ICD-10-CM | POA: Diagnosis not present

## 2022-09-23 DIAGNOSIS — I5032 Chronic diastolic (congestive) heart failure: Secondary | ICD-10-CM | POA: Diagnosis not present

## 2022-09-23 DIAGNOSIS — I272 Pulmonary hypertension, unspecified: Secondary | ICD-10-CM | POA: Diagnosis not present

## 2022-09-23 DIAGNOSIS — I4819 Other persistent atrial fibrillation: Secondary | ICD-10-CM | POA: Diagnosis not present

## 2022-09-23 DIAGNOSIS — E559 Vitamin D deficiency, unspecified: Secondary | ICD-10-CM | POA: Diagnosis not present

## 2022-09-24 ENCOUNTER — Telehealth: Payer: Self-pay

## 2022-09-24 NOTE — Telephone Encounter (Signed)
Copied from [place] [id number]. Topic: General - Other >> Sep 23, 2022 12:42 PM Tiffany B wrote:  Physical therapist requesting verbal orders for PT: gait training, balance training, endurance training. Caller would also like to discuss patient current and d/c medications.   Requesting verbal PT orders for the following frequency.  1x 1  2x 3 1x 1 2x 2 1x 2  Contacted Sree w/[facility] pt verified verbal orders given

## 2022-09-28 ENCOUNTER — Ambulatory Visit: Payer: Medicare Other | Admitting: Family

## 2022-09-28 ENCOUNTER — Ambulatory Visit: Payer: Medicare Other | Attending: Cardiology | Admitting: *Deleted

## 2022-09-28 DIAGNOSIS — Z7901 Long term (current) use of anticoagulants: Secondary | ICD-10-CM

## 2022-09-28 DIAGNOSIS — I5032 Chronic diastolic (congestive) heart failure: Secondary | ICD-10-CM | POA: Diagnosis not present

## 2022-09-28 DIAGNOSIS — M1990 Unspecified osteoarthritis, unspecified site: Secondary | ICD-10-CM | POA: Diagnosis not present

## 2022-09-28 DIAGNOSIS — I495 Sick sinus syndrome: Secondary | ICD-10-CM | POA: Diagnosis not present

## 2022-09-28 DIAGNOSIS — E559 Vitamin D deficiency, unspecified: Secondary | ICD-10-CM | POA: Diagnosis not present

## 2022-09-28 DIAGNOSIS — I480 Paroxysmal atrial fibrillation: Secondary | ICD-10-CM

## 2022-09-28 DIAGNOSIS — K760 Fatty (change of) liver, not elsewhere classified: Secondary | ICD-10-CM | POA: Diagnosis not present

## 2022-09-28 DIAGNOSIS — I272 Pulmonary hypertension, unspecified: Secondary | ICD-10-CM | POA: Diagnosis not present

## 2022-09-28 DIAGNOSIS — E7800 Pure hypercholesterolemia, unspecified: Secondary | ICD-10-CM | POA: Diagnosis not present

## 2022-09-28 DIAGNOSIS — E669 Obesity, unspecified: Secondary | ICD-10-CM | POA: Diagnosis not present

## 2022-09-28 DIAGNOSIS — I4892 Unspecified atrial flutter: Secondary | ICD-10-CM | POA: Diagnosis not present

## 2022-09-28 DIAGNOSIS — I2510 Atherosclerotic heart disease of native coronary artery without angina pectoris: Secondary | ICD-10-CM | POA: Diagnosis not present

## 2022-09-28 DIAGNOSIS — E1122 Type 2 diabetes mellitus with diabetic chronic kidney disease: Secondary | ICD-10-CM | POA: Diagnosis not present

## 2022-09-28 DIAGNOSIS — I130 Hypertensive heart and chronic kidney disease with heart failure and stage 1 through stage 4 chronic kidney disease, or unspecified chronic kidney disease: Secondary | ICD-10-CM | POA: Diagnosis not present

## 2022-09-28 DIAGNOSIS — N1831 Chronic kidney disease, stage 3a: Secondary | ICD-10-CM | POA: Diagnosis not present

## 2022-09-28 DIAGNOSIS — I083 Combined rheumatic disorders of mitral, aortic and tricuspid valves: Secondary | ICD-10-CM | POA: Diagnosis not present

## 2022-09-28 DIAGNOSIS — I252 Old myocardial infarction: Secondary | ICD-10-CM | POA: Diagnosis not present

## 2022-09-28 DIAGNOSIS — I4819 Other persistent atrial fibrillation: Secondary | ICD-10-CM | POA: Diagnosis not present

## 2022-09-28 LAB — POCT INR: INR: 3.8 — AB (ref 2.0–3.0)

## 2022-09-28 NOTE — Patient Instructions (Signed)
Description   Do not take any warfarin today then START taking 1 tablet daily except 1.5 tablets on Sundays and Thursdays. Continue eating 2 serving of greens per week plus your one day of brussels sprouts. Recheck INR in 3 weeks. Coumadin Clinic [phone] or [phone]

## 2022-09-30 ENCOUNTER — Telehealth: Payer: Self-pay | Admitting: Family

## 2022-09-30 DIAGNOSIS — I083 Combined rheumatic disorders of mitral, aortic and tricuspid valves: Secondary | ICD-10-CM | POA: Diagnosis not present

## 2022-09-30 DIAGNOSIS — K760 Fatty (change of) liver, not elsewhere classified: Secondary | ICD-10-CM | POA: Diagnosis not present

## 2022-09-30 DIAGNOSIS — I252 Old myocardial infarction: Secondary | ICD-10-CM | POA: Diagnosis not present

## 2022-09-30 DIAGNOSIS — I5032 Chronic diastolic (congestive) heart failure: Secondary | ICD-10-CM | POA: Diagnosis not present

## 2022-09-30 DIAGNOSIS — M1990 Unspecified osteoarthritis, unspecified site: Secondary | ICD-10-CM | POA: Diagnosis not present

## 2022-09-30 DIAGNOSIS — I272 Pulmonary hypertension, unspecified: Secondary | ICD-10-CM | POA: Diagnosis not present

## 2022-09-30 DIAGNOSIS — I4892 Unspecified atrial flutter: Secondary | ICD-10-CM | POA: Diagnosis not present

## 2022-09-30 DIAGNOSIS — I495 Sick sinus syndrome: Secondary | ICD-10-CM | POA: Diagnosis not present

## 2022-09-30 DIAGNOSIS — E559 Vitamin D deficiency, unspecified: Secondary | ICD-10-CM | POA: Diagnosis not present

## 2022-09-30 DIAGNOSIS — I2510 Atherosclerotic heart disease of native coronary artery without angina pectoris: Secondary | ICD-10-CM | POA: Diagnosis not present

## 2022-09-30 DIAGNOSIS — I130 Hypertensive heart and chronic kidney disease with heart failure and stage 1 through stage 4 chronic kidney disease, or unspecified chronic kidney disease: Secondary | ICD-10-CM | POA: Diagnosis not present

## 2022-09-30 DIAGNOSIS — E669 Obesity, unspecified: Secondary | ICD-10-CM | POA: Diagnosis not present

## 2022-09-30 DIAGNOSIS — I4819 Other persistent atrial fibrillation: Secondary | ICD-10-CM | POA: Diagnosis not present

## 2022-09-30 DIAGNOSIS — E7800 Pure hypercholesterolemia, unspecified: Secondary | ICD-10-CM | POA: Diagnosis not present

## 2022-09-30 DIAGNOSIS — E1122 Type 2 diabetes mellitus with diabetic chronic kidney disease: Secondary | ICD-10-CM | POA: Diagnosis not present

## 2022-09-30 DIAGNOSIS — N1831 Chronic kidney disease, stage 3a: Secondary | ICD-10-CM | POA: Diagnosis not present

## 2022-09-30 NOTE — Telephone Encounter (Signed)
Copied from [place] [id number]. Topic: General - Other >> Sep 30, 2022  3:30 PM Oley Balm E wrote: Reason for CRM: Pt's daughter called reporting that the patient is in the process of losing her husband (he is in hospice) Pt's daughter is requesting something to help her manage the stress and sadness.  [place], [place] - [address] [place] [address] Phone: [phone] Fax: [phone]

## 2022-09-30 NOTE — Telephone Encounter (Signed)
Schedule appointment when best for patient. I will need to discuss with patient in detail medication regimen for new onset stress and sadness prior to prescribing. Please update me if I can further assist.

## 2022-10-01 DIAGNOSIS — I083 Combined rheumatic disorders of mitral, aortic and tricuspid valves: Secondary | ICD-10-CM | POA: Diagnosis not present

## 2022-10-01 DIAGNOSIS — N1831 Chronic kidney disease, stage 3a: Secondary | ICD-10-CM | POA: Diagnosis not present

## 2022-10-01 DIAGNOSIS — M1990 Unspecified osteoarthritis, unspecified site: Secondary | ICD-10-CM | POA: Diagnosis not present

## 2022-10-01 DIAGNOSIS — E669 Obesity, unspecified: Secondary | ICD-10-CM | POA: Diagnosis not present

## 2022-10-01 DIAGNOSIS — E1122 Type 2 diabetes mellitus with diabetic chronic kidney disease: Secondary | ICD-10-CM | POA: Diagnosis not present

## 2022-10-01 DIAGNOSIS — I252 Old myocardial infarction: Secondary | ICD-10-CM | POA: Diagnosis not present

## 2022-10-01 DIAGNOSIS — E7800 Pure hypercholesterolemia, unspecified: Secondary | ICD-10-CM | POA: Diagnosis not present

## 2022-10-01 DIAGNOSIS — I4892 Unspecified atrial flutter: Secondary | ICD-10-CM | POA: Diagnosis not present

## 2022-10-01 DIAGNOSIS — I2510 Atherosclerotic heart disease of native coronary artery without angina pectoris: Secondary | ICD-10-CM | POA: Diagnosis not present

## 2022-10-01 DIAGNOSIS — I130 Hypertensive heart and chronic kidney disease with heart failure and stage 1 through stage 4 chronic kidney disease, or unspecified chronic kidney disease: Secondary | ICD-10-CM | POA: Diagnosis not present

## 2022-10-01 DIAGNOSIS — I4819 Other persistent atrial fibrillation: Secondary | ICD-10-CM | POA: Diagnosis not present

## 2022-10-01 DIAGNOSIS — E559 Vitamin D deficiency, unspecified: Secondary | ICD-10-CM | POA: Diagnosis not present

## 2022-10-01 DIAGNOSIS — I495 Sick sinus syndrome: Secondary | ICD-10-CM | POA: Diagnosis not present

## 2022-10-01 DIAGNOSIS — I5032 Chronic diastolic (congestive) heart failure: Secondary | ICD-10-CM | POA: Diagnosis not present

## 2022-10-01 DIAGNOSIS — K760 Fatty (change of) liver, not elsewhere classified: Secondary | ICD-10-CM | POA: Diagnosis not present

## 2022-10-01 DIAGNOSIS — I272 Pulmonary hypertension, unspecified: Secondary | ICD-10-CM | POA: Diagnosis not present

## 2022-10-04 NOTE — Progress Notes (Deleted)
Virtual Visit via Telephone Note  I connected with Tracey Todd, on 10/04/2022 at 1:37 PM by telephone and verified that I am speaking with the correct person using two identifiers.  Consent: I discussed the limitations, risks, security and privacy concerns of performing an evaluation and management service by telephone and the availability of in person appointments. I also discussed with the patient that there may be a patient responsible charge related to this service. The patient expressed understanding and agreed to proceed.   Location of Patient: Home  Location of Provider: [facility] [place] participating in Telemedicine visit: Howard Pouch, NP Elmon Else, CMA   History of Present Illness: Tracey Todd is a 88 y.o. female who presents for grief.    Past Medical History:  Diagnosis Date   Atrial flutter ([place])    a. mentioned in 2016 admission.   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 minimal by cath 2016.   Nodule of chest wall    Normal coronary arteries and LVF 10/23/14 10/24/2014   Pacemaker implanted 10/25/14- St Jude 10/26/2014   Paroxysmal atrial fibrillation (HCC)    a. identified on device interrogation, burden low   Pulmonary hypertension (HCC)    Sick sinus syndrome ([place])    a. s/p STJ dual chamber PPM    Type II or unspecified type diabetes mellitus without mention of complication, not stated as uncontrolled    Vitamin D deficiency    Allergies  Allergen Reactions   Penicillins Anaphylaxis   Quinapril     Angioedema    Feldene [Piroxicam] Other (See Comments)    Bleeding    Calcium-Containing Compounds Other (See Comments)    "calcium deposits on skin"   Celebrex [Celecoxib] Itching   Daypro [Oxaprozin] Itching     Current Outpatient Medications on File Prior to Visit  Medication Sig Dispense Refill   albuterol (VENTOLIN HFA) 108 (90 Base) MCG/ACT inhaler Inhale 2 puffs into the lungs every 6 (six) hours as needed for wheezing or shortness of breath. 8 g 1   amLODipine (NORVASC) 5 MG tablet Take 1 tablet by mouth once daily 90 tablet 0   atorvastatin (LIPITOR) 40 MG tablet Take 1 tablet (40 mg total) by mouth daily. 90 tablet 1   benzonatate (TESSALON) 100 MG capsule Take 1 capsule (100 mg total) by mouth every 8 (eight) hours. 21 capsule 0   bisoprolol (ZEBETA) 10 MG tablet Take 1 tablet by mouth once daily for blood pressure 90 tablet 0   Blood Glucose Monitoring Suppl (ONETOUCH VERIO) w/Device KIT Use to check fasting blood sugar daily. Dx: E11.59 1 kit 0   calcium carbonate (OS-CAL) 600 MG TABS tablet Take 600 mg by mouth daily with breakfast.     cetirizine (ZYRTEC) 10 MG tablet Take 10 mg by mouth every other day.      Cholecalciferol (VITAMIN D) 2000 UNITS CAPS Take 6,000 Units by mouth daily.      dapagliflozin propanediol (FARXIGA) 10 MG TABS tablet Take 1 tablet (10 mg total) by mouth daily before breakfast. 90 tablet 3   gabapentin (NEURONTIN) 300 MG capsule Take 1 capsule (300 mg total) by mouth 2 (two) times daily. 180 capsule 1   glucose blood (  ONETOUCH VERIO) test strip USE TO CHECK FASTING BLOOD SUGAR TWICE DAILY 100 each 11   Lancets (ONETOUCH DELICA PLUS [id number]) MISC USE TO CHECK FASTING BLOOD SUGAR TWICE DAILY 100 each 2   Magnesium 500 MG TABS Take 1,000 mg by mouth in the morning and at bedtime. 2 tabs am, 2 tab pm     metFORMIN (GLUCOPHAGE-XR) 500 MG 24 hr tablet Take 1 tablet (500 mg total) by mouth 2 (two) times daily with a meal. 60 tablet 2   spironolactone (ALDACTONE) 25 MG tablet Take 0.5 tablets (12.5 mg total) by mouth daily. 45 tablet 3   torsemide (DEMADEX) 20 MG tablet Take 2 tablets (40 mg total) by mouth 2 (two) times daily. 360 tablet 3   warfarin (COUMADIN) 5 MG  tablet Take 1.5 tablets daily except 1 tablet on Mondays, Wednesday and Fridays daily or as [place] 40 tablet 3   No current facility-administered medications on file prior to visit.    Observations/Objective: Alert and oriented x 3. Not in acute distress. Physical examination not completed as this is a telemedicine visit.  Assessment and Plan: ***  Follow Up Instructions: ***   Patient was given clear instructions to go to Emergency Department or return to medical center if symptoms don't improve, worsen, or new problems develop.The patient verbalized understanding.  I discussed the assessment and treatment plan with the patient. The patient was provided an opportunity to ask questions and all were answered. The patient agreed with the plan and demonstrated an understanding of the instructions.   The patient was advised to call back or seek an in-person evaluation if the symptoms worsen or if the condition fails to improve as anticipated.     I provided *** minutes total of non-face-to-face time during this encounter.   Camillia Herter, NP  [facility] [place], [place] 10/04/2022, 1:37 PM

## 2022-10-06 ENCOUNTER — Telehealth: Payer: Medicare Other | Admitting: Family

## 2022-10-06 NOTE — Progress Notes (Unsigned)
Virtual Visit via Telephone Note  I connected with Tracey Todd, on 10/07/2022 at 9:40 AM by telephone and verified that I am speaking with the correct person using two identifiers.  Consent: I discussed the limitations, risks, security and privacy concerns of performing an evaluation and management service by telephone and the availability of in person appointments. I also discussed with the patient that there may be a patient responsible charge related to this service. The patient expressed understanding and agreed to proceed.   Location of Patient: Home  Location of Provider: [facility] [place] participating in Telemedicine visit: Howard Pouch, NP Elmon Else, CMA   History of Present Illness: Tracey Todd is a 88 y.o. female who presents for grief. Reports her husband passed away on Oct 29, 2022. They were married for 69 years. His funeral service will be on 10/09/2022. Her daughter and family are supportive. She is receiving counseling from a therapist at [facility]. She denies thoughts of self-harm, suicidal ideations, and homicidal ideations. No further issues/concerns.   Past Medical History:  Diagnosis Date   Atrial flutter ([place])    a. mentioned in 2016 admission.   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 minimal by cath 2016.   Nodule of chest wall    Normal coronary arteries and LVF 10/23/14 10/24/2014   Pacemaker implanted 10/25/14- St Jude 10/26/2014   Paroxysmal atrial fibrillation (HCC)    a. identified on device interrogation, burden low   Pulmonary hypertension (HCC)    Sick sinus syndrome ([place])    a. s/p STJ dual chamber PPM    Type II or unspecified type diabetes mellitus without mention of complication, not stated as uncontrolled     Vitamin D deficiency    Allergies  Allergen Reactions   Penicillins Anaphylaxis   Quinapril     Angioedema    Feldene [Piroxicam] Other (See Comments)    Bleeding    Calcium-Containing Compounds Other (See Comments)    "calcium deposits on skin"   Celebrex [Celecoxib] Itching   Daypro [Oxaprozin] Itching    Current Outpatient Medications on File Prior to Visit  Medication Sig Dispense Refill   albuterol (VENTOLIN HFA) 108 (90 Base) MCG/ACT inhaler Inhale 2 puffs into the lungs every 6 (six) hours as needed for wheezing or shortness of breath. 8 g 1   amLODipine (NORVASC) 5 MG tablet Take 1 tablet by mouth once daily 90 tablet 0   atorvastatin (LIPITOR) 40 MG tablet Take 1 tablet (40 mg total) by mouth daily. 90 tablet 1   benzonatate (TESSALON) 100 MG capsule Take 1 capsule (100 mg total) by mouth every 8 (eight) hours. 21 capsule 0   bisoprolol (ZEBETA) 10 MG tablet Take 1 tablet by mouth once daily for blood pressure 90 tablet 0   Blood Glucose Monitoring Suppl (ONETOUCH VERIO) w/Device KIT Use to check fasting blood sugar daily. Dx: E11.59 1 kit 0   calcium carbonate (OS-CAL) 600 MG TABS tablet Take 600 mg by mouth daily with breakfast.     cetirizine (ZYRTEC) 10 MG tablet Take 10 mg by mouth every other day.      Cholecalciferol (VITAMIN D) 2000 UNITS CAPS Take 6,000 Units by mouth daily.  dapagliflozin propanediol (FARXIGA) 10 MG TABS tablet Take 1 tablet (10 mg total) by mouth daily before breakfast. 90 tablet 3   gabapentin (NEURONTIN) 300 MG capsule Take 1 capsule (300 mg total) by mouth 2 (two) times daily. 180 capsule 1   glucose blood (ONETOUCH VERIO) test strip USE TO CHECK FASTING BLOOD SUGAR TWICE DAILY 100 each 11   Lancets (ONETOUCH DELICA PLUS [id number]) MISC USE TO CHECK FASTING BLOOD SUGAR TWICE DAILY 100 each 2   Magnesium 500 MG TABS Take 1,000 mg by mouth in the morning and at bedtime. 2 tabs am, 2 tab pm     metFORMIN (GLUCOPHAGE-XR) 500 MG 24 hr tablet Take  1 tablet (500 mg total) by mouth 2 (two) times daily with a meal. 60 tablet 2   spironolactone (ALDACTONE) 25 MG tablet Take 0.5 tablets (12.5 mg total) by mouth daily. 45 tablet 3   torsemide (DEMADEX) 20 MG tablet Take 2 tablets (40 mg total) by mouth 2 (two) times daily. 360 tablet 3   warfarin (COUMADIN) 5 MG tablet Take 1.5 tablets daily except 1 tablet on Mondays, Wednesday and Fridays daily or as [place] 40 tablet 3   No current facility-administered medications on file prior to visit.    Observations/Objective: Alert and oriented x 3. Not in acute distress. Physical examination not completed as this is a telemedicine visit.  Assessment and Plan: 1. Grief - Patient denies thoughts of self-harm, suicidal ideations, homicidal ideations. - Mirtazapine as prescribed. Counseled on medication adherence/adverse effects.  - Patient receiving counseling from a therapist at [facility].  - Follow-up with primary provider as scheduled.  - mirtazapine (REMERON) 7.5 MG tablet; Take 1 tablet (7.5 mg total) by mouth at bedtime.  Dispense: 30 tablet; Refill: 0   Follow Up Instructions: Follow-up with primary provider as scheduled.    Patient was given clear instructions to go to Emergency Department or return to medical center if symptoms don't improve, worsen, or new problems develop.The patient verbalized understanding.  I discussed the assessment and treatment plan with the patient. The patient was provided an opportunity to ask questions and all were answered. The patient agreed with the plan and demonstrated an understanding of the instructions.   The patient was advised to call back or seek an in-person evaluation if the symptoms worsen or if the condition fails to improve as anticipated.    I provided 7 minutes total of non-face-to-face time during this encounter.   Camillia Herter, NP  [facility] at [place],  [place] 10/07/2022, 9:48 AM

## 2022-10-07 ENCOUNTER — Telehealth (INDEPENDENT_AMBULATORY_CARE_PROVIDER_SITE_OTHER): Payer: Medicare Other | Admitting: Family

## 2022-10-07 ENCOUNTER — Encounter (HOSPITAL_COMMUNITY): Payer: Self-pay | Admitting: *Deleted

## 2022-10-07 DIAGNOSIS — F4321 Adjustment disorder with depressed mood: Secondary | ICD-10-CM | POA: Diagnosis not present

## 2022-10-07 MED ORDER — MIRTAZAPINE 7.5 MG PO TABS: 7.5000 mg | ORAL_TABLET | Freq: Every day | ORAL | 0 refills | Status: DC

## 2022-10-08 DIAGNOSIS — E1141 Type 2 diabetes mellitus with diabetic mononeuropathy: Secondary | ICD-10-CM | POA: Diagnosis not present

## 2022-10-08 DIAGNOSIS — N1832 Chronic kidney disease, stage 3b: Secondary | ICD-10-CM | POA: Diagnosis not present

## 2022-10-12 ENCOUNTER — Other Ambulatory Visit: Payer: Self-pay | Admitting: Neurology

## 2022-10-12 ENCOUNTER — Other Ambulatory Visit: Payer: Self-pay | Admitting: Family

## 2022-10-12 DIAGNOSIS — E1122 Type 2 diabetes mellitus with diabetic chronic kidney disease: Secondary | ICD-10-CM | POA: Diagnosis not present

## 2022-10-12 DIAGNOSIS — M48062 Spinal stenosis, lumbar region with neurogenic claudication: Secondary | ICD-10-CM

## 2022-10-12 DIAGNOSIS — I252 Old myocardial infarction: Secondary | ICD-10-CM | POA: Diagnosis not present

## 2022-10-12 DIAGNOSIS — N1831 Chronic kidney disease, stage 3a: Secondary | ICD-10-CM | POA: Diagnosis not present

## 2022-10-12 DIAGNOSIS — I4892 Unspecified atrial flutter: Secondary | ICD-10-CM | POA: Diagnosis not present

## 2022-10-12 DIAGNOSIS — E559 Vitamin D deficiency, unspecified: Secondary | ICD-10-CM | POA: Diagnosis not present

## 2022-10-12 DIAGNOSIS — I083 Combined rheumatic disorders of mitral, aortic and tricuspid valves: Secondary | ICD-10-CM | POA: Diagnosis not present

## 2022-10-12 DIAGNOSIS — I2510 Atherosclerotic heart disease of native coronary artery without angina pectoris: Secondary | ICD-10-CM | POA: Diagnosis not present

## 2022-10-12 DIAGNOSIS — K760 Fatty (change of) liver, not elsewhere classified: Secondary | ICD-10-CM | POA: Diagnosis not present

## 2022-10-12 DIAGNOSIS — E1142 Type 2 diabetes mellitus with diabetic polyneuropathy: Secondary | ICD-10-CM

## 2022-10-12 DIAGNOSIS — I5032 Chronic diastolic (congestive) heart failure: Secondary | ICD-10-CM | POA: Diagnosis not present

## 2022-10-12 DIAGNOSIS — M1990 Unspecified osteoarthritis, unspecified site: Secondary | ICD-10-CM | POA: Diagnosis not present

## 2022-10-12 DIAGNOSIS — I272 Pulmonary hypertension, unspecified: Secondary | ICD-10-CM | POA: Diagnosis not present

## 2022-10-12 DIAGNOSIS — I10 Essential (primary) hypertension: Secondary | ICD-10-CM

## 2022-10-12 DIAGNOSIS — E669 Obesity, unspecified: Secondary | ICD-10-CM | POA: Diagnosis not present

## 2022-10-12 DIAGNOSIS — I4819 Other persistent atrial fibrillation: Secondary | ICD-10-CM | POA: Diagnosis not present

## 2022-10-12 DIAGNOSIS — I130 Hypertensive heart and chronic kidney disease with heart failure and stage 1 through stage 4 chronic kidney disease, or unspecified chronic kidney disease: Secondary | ICD-10-CM | POA: Diagnosis not present

## 2022-10-12 DIAGNOSIS — E7800 Pure hypercholesterolemia, unspecified: Secondary | ICD-10-CM | POA: Diagnosis not present

## 2022-10-12 DIAGNOSIS — I495 Sick sinus syndrome: Secondary | ICD-10-CM | POA: Diagnosis not present

## 2022-10-12 NOTE — Progress Notes (Unsigned)
Cardiology Office Note Date:  10/13/2022  Patient ID:  Tracey Todd, Tracey Todd [birth year], MRN [id number] PCP:  Camillia Herter, NP  Cardiologist:  Werner Lean, MD Electrophysiologist: [name], MD > Will Meredith Leeds, MD (though has not seen yet)  Chief Complaint: 48mo follow-up  History of Present Illness: Tracey Todd a 88y.o. female with PMH notable for CHB s/p PPM, parox Afib, HFpEF, HTN; seen today for Will MMeredith Leeds MD for routine electrophysiology followup.  Last saw Dr. ARayann Heman8/2023 had been in persistent AF since 01/2022. Rate control only strategy, minimally symptomatic. Last saw gen cards NP Swinyer 08/2022, weight stable at dry weight (184lb) taking torsemide 234mevery other day, leg edema improved, some DOE. Since that time, her husband has passed away, funeral was earlier this week. She is understandably teary during appointment today. Weight this AM at home was 184lb, had been hovering right around 180lb. BLE edema is slightly increased. Feels bloated. Not eating much.  Warfarin managed at clinic, states her levels have been off the past couple checks    she denies chest pain, palpitations, dyspnea, PND, orthopnea, nausea, vomiting, dizziness, syncope.  Device Information: Abbott dual chamber PPM, impl 2016; dx SSS  Past Medical History:  Diagnosis Date   [place]   a. mentioned in 2016 admission.   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 minimal by cath 2016.   Nodule of chest wall    Normal coronary arteries and LVF 10/23/14 10/24/2014   Pacemaker implanted 10/25/14- St Jude 10/26/2014   Paroxysmal atrial fibrillation (HCC)    a. identified on device interrogation, burden low   Pulmonary hypertension (HCC)    S[place]   a. s/p STJ dual  chamber PPM    Type II or unspecified type diabetes mellitus without mention of complication, not stated as uncontrolled    Vitamin D deficiency     Past Surgical History:  Procedure Laterality Date   BREAST SURGERY     CATARACT EXTRACTION     LEFT AND RIGHT HEART CATHETERIZATION WITH CORONARY ANGIOGRAM N/A 10/23/2014   Procedure: LEFT AND RIGHT HEART CATHETERIZATION WITH CORONARY ANGIOGRAM;  Surgeon: MiBlane OharaMD;  [facility];  Service: Cardiovascular;  Laterality: N/A;   PERMANENT PACEMAKER INSERTION N/A 10/25/2014   STJ dual chamber PPM implanted by Dr AlRayann Hemanor SSS   TONSILLECTOMY AND ADENOIDECTOMY     TOTAL KNEE ARTHROPLASTY      Current Outpatient Medications  Medication Instructions   albuterol (VENTOLIN HFA) 108 (90 Base) MCG/ACT inhaler 2 puffs, Inhalation, Every 6 hours PRN   amLODipine (NORVASC) 5 mg, Oral, Daily   atorvastatin (LIPITOR) 40 mg, Oral, Daily   bisoprolol (ZEBETA) 10 MG tablet Take 1 tablet by mouth once daily for blood pressure   Blood Glucose Monitoring Suppl (ONETOUCH VERIO) w/Device KIT Use to check fasting blood sugar daily. Dx: E11.59   calcium carbonate (OS-CAL) 600 mg, Oral, Daily with breakfast   dapagliflozin propanediol (FARXIGA) 10 mg, Oral, Daily before breakfast   gabapentin (NEURONTIN) 300 mg, Oral, 2 times daily   glucose blood (ONETOUCH VERIO) test strip USE TO CHECK FASTING BLOOD SUGAR TWICE DAILY   Lancets (ONETOUCH DELICA PLUS [id number] USE TO  CHECK FASTING BLOOD SUGAR TWICE DAILY   Lantus SoloStar 8 Units, Subcutaneous, Daily at bedtime   Magnesium 1,000 mg, Oral, 2 times daily, 2 tabs am, 2 tab pm   spironolactone (ALDACTONE) 12.5 mg, Oral, Daily   torsemide (DEMADEX) 40 mg, Oral, 2 times daily   Vitamin D 6,000 Units, Oral, Daily   warfarin (COUMADIN) 5 MG tablet Take 1.5 tablets daily except 1 tablet on Mondays, Wednesday and Fridays daily or as [place]      Social History:  The  patient  reports that she quit smoking about 58 years ago. Her smoking use included cigarettes. She has never been exposed to tobacco smoke. She has never used smokeless tobacco. She reports that she does not drink alcohol and does not use drugs.   Family History:  The patient's family history includes Cancer in her father and mother; Diabetes in her mother.  ROS:  Please see the history of present illness. All other systems are reviewed and otherwise negative.   PHYSICAL EXAM:  VS:  BP 114/66   Pulse 64   Ht 4' 10.8" (1.494 m)   Wt 186 lb (84.4 kg)   SpO2 97%   BMI 37.82 kg/m  BMI: Body mass index is 37.82 kg/m.  GEN- The patient is well appearing, alert and oriented x 3 today.   HEENT: normocephalic, atraumatic; sclera clear, conjunctiva pink; hearing intact; oropharynx clear; neck supple, no JVP Lungs- Clear to ausculation bilaterally, normal work of breathing.  No wheezes, rales, rhonchi Heart- Irregularly irregular rate and rhythm, no murmurs, rubs or gallops, PMI not laterally displaced GI- soft, non-tender, non-distended, bowel sounds present, no hepatosplenomegaly Extremities- 1-2+ peripheral edema. no clubbing or cyanosis; DP/PT/radial pulses 2+ bilaterally MS- no significant deformity or atrophy Skin- warm and dry, no rash or lesion,  device pocket well-healed Psych- euthymic mood, full affect Neuro- strength and sensation are intact   Device interrogation done today and reviewed by myself:  Battery good Lead impedence, V-thresholds, and A-sensing stable  Unable to perform A-threshold in AF Unable to sense V waves down to 30bpm 100% AF burden since June 2023 Turned off AF alert   EKG is not ordered.   Recent Labs: 08/06/2022: ALT 20; Hemoglobin 11.6; NT-Pro BNP 2,368; Platelets 231; TSH 2.710 09/03/2022: BUN 18; Creatinine, Ser 0.98; Potassium 4.7; Sodium 138  11/30/2021: Chol/HDL Ratio 3.3; Cholesterol, Total 194; HDL 59; LDL Chol Calc (NIH) 113; Triglycerides 122    CrCl cannot be calculated (Patient's most recent lab result is older than the maximum 21 days allowed.).   Wt Readings from Last 3 Encounters:  10/13/22 186 lb (84.4 kg)  09/03/22 189 lb 9.6 oz (86 kg)  08/26/22 186 lb 9.6 oz (84.6 kg)     Additional studies reviewed include: Previous EP, cardiology notes.   TTE 08/06/2022  1. Left ventricular ejection fraction, by estimation, is 60 to 65%. The left ventricle has normal function. Left ventricular endocardial border not optimally defined to evaluate regional wall motion. There is mild left ventricular hypertrophy of the  basal-septal segment. Left ventricular diastolic function could not be evaluated.   2. Right ventricular systolic function is mildly reduced. The right ventricular size is normal. There is moderately elevated pulmonary artery systolic pressure. The estimated right ventricular systolic pressure is 123XX123 mmHg.   3. Left atrial size was mildly dilated.   4. Right atrial size was moderately dilated.   5. The mitral valve is normal in structure. Mild mitral valve  regurgitation.   6. The tricuspid valve is abnormal. Tricuspid valve regurgitation is severe.   7. The aortic valve is tricuspid. There is mild calcification of the aortic valve. There is mild thickening of the aortic valve. Aortic valve regurgitation is not visualized. Aortic valve sclerosis/calcification is present, without any evidence of  aortic stenosis.   ASSESSMENT AND PLAN:  #) CHB s/p Abbott dual chamber PPM PPM functioning well, see paceart for details Dependent today down to 30bpm Turned off AF alert as patient has been in perm AF for many months. Remained in DDDR mode to provide AV synchrony if she does convert to SR  #) permanent Afib No symptomatic burden Ventricular rates well-controlled  #) Hypercoag d/t AFib CHA2DS2-VASc Score = 7 [CHF History: 1, HTN History: 1, Diabetes History: 1, Stroke History: 0, Vascular Disease History: 1, Age Score:  2, Gender Score: 1].  Therefore, the patient's annual risk of stroke is 11.2 %.    [place] - warfarin, managed at [facility] cards office   #) HFpEF Increased stress with husband's death with different eating habits Fluid overloaded today with weight up, increased lower ext edema, bloating Takes 82m torsemide every other day, took yesterday (Tuesday) I recommended she take an extra dose today (Wednesday) Will take as scheduled tomorrow (Thursday) On Friday, recommended she use weight and symptoms as guide to take extra dose.   #) HTN Well-controlled   Current medicines are reviewed at length with the patient today.   The patient does not have concerns regarding her medicines.  The following changes were made today:   Take extra torsemide today Take extra torsemide Friday if needed   Labs/ tests ordered today include:  No orders of the defined types were placed in this encounter.    Disposition: Follow up with Dr. CCurt Bearsin in 12 months to establish care with new MD   Signed, SMamie Levers NP  10/13/22  9:49 AM  Electrophysiology [facility]

## 2022-10-13 ENCOUNTER — Ambulatory Visit: Payer: Medicare Other | Attending: Physician Assistant | Admitting: Cardiology

## 2022-10-13 ENCOUNTER — Encounter: Payer: Self-pay | Admitting: Physician Assistant

## 2022-10-13 VITALS — BP 114/66 | HR 64 | Ht 58.8 in | Wt 186.0 lb

## 2022-10-13 DIAGNOSIS — I10 Essential (primary) hypertension: Secondary | ICD-10-CM | POA: Diagnosis not present

## 2022-10-13 DIAGNOSIS — I509 Heart failure, unspecified: Secondary | ICD-10-CM | POA: Diagnosis not present

## 2022-10-13 DIAGNOSIS — Z950 Presence of cardiac pacemaker: Secondary | ICD-10-CM

## 2022-10-13 DIAGNOSIS — I495 Sick sinus syndrome: Secondary | ICD-10-CM | POA: Diagnosis not present

## 2022-10-13 DIAGNOSIS — I4819 Other persistent atrial fibrillation: Secondary | ICD-10-CM

## 2022-10-13 DIAGNOSIS — Z7901 Long term (current) use of anticoagulants: Secondary | ICD-10-CM

## 2022-10-13 DIAGNOSIS — I5033 Acute on chronic diastolic (congestive) heart failure: Secondary | ICD-10-CM

## 2022-10-13 LAB — CUP PACEART INCLINIC DEVICE CHECK
Battery Remaining Longevity: 26 mo
Battery Voltage: 2.92 V
Brady Statistic RA Percent Paced: 0 %
Brady Statistic RV Percent Paced: 99.33 %
Date Time Interrogation Session: 20240214095754
Implantable Lead Connection Status: 753985
Implantable Lead Connection Status: 753985
Implantable Lead Implant Date: 20160226
Implantable Lead Implant Date: 20160226
Implantable Lead Location: 753859
Implantable Lead Location: 753860
Implantable Lead Model: 1948
Implantable Pulse Generator Implant Date: 20160226
Lead Channel Impedance Value: 412.5 Ohm
Lead Channel Impedance Value: 562.5 Ohm
Lead Channel Pacing Threshold Amplitude: 0.625 V
Lead Channel Pacing Threshold Amplitude: 0.75 V
Lead Channel Pacing Threshold Amplitude: 0.75 V
Lead Channel Pacing Threshold Pulse Width: 0.4 ms
Lead Channel Pacing Threshold Pulse Width: 0.4 ms
Lead Channel Pacing Threshold Pulse Width: 0.4 ms
Lead Channel Sensing Intrinsic Amplitude: 1.3 mV
Lead Channel Setting Pacing Amplitude: 1.625
Lead Channel Setting Pacing Amplitude: 2.5 V
Lead Channel Setting Pacing Pulse Width: 0.4 ms
Lead Channel Setting Sensing Sensitivity: 2 mV
Pulse Gen Model: 2240
Pulse Gen Serial Number: 7700661

## 2022-10-13 MED ORDER — TORSEMIDE 20 MG PO TABS: 40.0000 mg | ORAL_TABLET | ORAL | 3 refills | Status: DC

## 2022-10-13 NOTE — Patient Instructions (Signed)
Medication Instructions:   MAKE SURE YOU TAKE TORSEMIDE DOSE TODAY  THEN  RESUME TAKING EVERY OTHER DAY   *If you need a refill on your cardiac medications before your next appointment, please call your pharmacy*   Lab Work: NONE ORDERED  TODAY    If you have labs (blood work) drawn today and your tests are completely normal, you will receive your results only by: [place] (if you have MyChart) OR A paper copy in the mail If you have any lab test that is abnormal or we need to change your treatment, we will call you to review the results.   Testing/Procedures: NONE ORDERED  TODAY    Follow-Up: At [facility], you and your health needs are our priority.  As part of our continuing mission to provide you with exceptional heart care, we have created designated Provider Care Teams.  These Care Teams include your primary Cardiologist (physician) and Advanced Practice Providers (APPs -  Physician Assistants and Nurse Practitioners) who all work together to provide you with the care you need, when you need it.  We recommend signing up for the patient portal called "MyChart".  Sign up information is provided on this After Visit Summary.  MyChart is used to connect with patients for Virtual Visits (Telemedicine).  Patients are able to view lab/test results, encounter notes, upcoming appointments, etc.  Non-urgent messages can be sent to your provider as well.   To learn more about what you can do with MyChart, go to [email or web address].    Your next appointment:   1 year(s)  Provider:   You may see Will Meredith Leeds, MD or one of the following Advanced Practice Providers on your designated Care Team:   Tommye Standard, [place] Legrand Como "Jonni Sanger" [place], [place] Mamie Levers, NP   Other Instructions

## 2022-10-14 DIAGNOSIS — I083 Combined rheumatic disorders of mitral, aortic and tricuspid valves: Secondary | ICD-10-CM | POA: Diagnosis not present

## 2022-10-14 DIAGNOSIS — I5032 Chronic diastolic (congestive) heart failure: Secondary | ICD-10-CM | POA: Diagnosis not present

## 2022-10-14 DIAGNOSIS — I4892 Unspecified atrial flutter: Secondary | ICD-10-CM | POA: Diagnosis not present

## 2022-10-14 DIAGNOSIS — I4819 Other persistent atrial fibrillation: Secondary | ICD-10-CM | POA: Diagnosis not present

## 2022-10-14 DIAGNOSIS — I2510 Atherosclerotic heart disease of native coronary artery without angina pectoris: Secondary | ICD-10-CM | POA: Diagnosis not present

## 2022-10-14 DIAGNOSIS — I272 Pulmonary hypertension, unspecified: Secondary | ICD-10-CM | POA: Diagnosis not present

## 2022-10-14 DIAGNOSIS — E7800 Pure hypercholesterolemia, unspecified: Secondary | ICD-10-CM | POA: Diagnosis not present

## 2022-10-14 DIAGNOSIS — I130 Hypertensive heart and chronic kidney disease with heart failure and stage 1 through stage 4 chronic kidney disease, or unspecified chronic kidney disease: Secondary | ICD-10-CM | POA: Diagnosis not present

## 2022-10-14 DIAGNOSIS — K760 Fatty (change of) liver, not elsewhere classified: Secondary | ICD-10-CM | POA: Diagnosis not present

## 2022-10-14 DIAGNOSIS — I252 Old myocardial infarction: Secondary | ICD-10-CM | POA: Diagnosis not present

## 2022-10-14 DIAGNOSIS — N1831 Chronic kidney disease, stage 3a: Secondary | ICD-10-CM | POA: Diagnosis not present

## 2022-10-14 DIAGNOSIS — E559 Vitamin D deficiency, unspecified: Secondary | ICD-10-CM | POA: Diagnosis not present

## 2022-10-14 DIAGNOSIS — E669 Obesity, unspecified: Secondary | ICD-10-CM | POA: Diagnosis not present

## 2022-10-14 DIAGNOSIS — M1990 Unspecified osteoarthritis, unspecified site: Secondary | ICD-10-CM | POA: Diagnosis not present

## 2022-10-14 DIAGNOSIS — E1122 Type 2 diabetes mellitus with diabetic chronic kidney disease: Secondary | ICD-10-CM | POA: Diagnosis not present

## 2022-10-14 DIAGNOSIS — I495 Sick sinus syndrome: Secondary | ICD-10-CM | POA: Diagnosis not present

## 2022-10-15 DIAGNOSIS — I4892 Unspecified atrial flutter: Secondary | ICD-10-CM | POA: Diagnosis not present

## 2022-10-15 DIAGNOSIS — M1990 Unspecified osteoarthritis, unspecified site: Secondary | ICD-10-CM | POA: Diagnosis not present

## 2022-10-15 DIAGNOSIS — I130 Hypertensive heart and chronic kidney disease with heart failure and stage 1 through stage 4 chronic kidney disease, or unspecified chronic kidney disease: Secondary | ICD-10-CM | POA: Diagnosis not present

## 2022-10-15 DIAGNOSIS — I2510 Atherosclerotic heart disease of native coronary artery without angina pectoris: Secondary | ICD-10-CM | POA: Diagnosis not present

## 2022-10-15 DIAGNOSIS — E559 Vitamin D deficiency, unspecified: Secondary | ICD-10-CM | POA: Diagnosis not present

## 2022-10-15 DIAGNOSIS — I4819 Other persistent atrial fibrillation: Secondary | ICD-10-CM | POA: Diagnosis not present

## 2022-10-15 DIAGNOSIS — I495 Sick sinus syndrome: Secondary | ICD-10-CM | POA: Diagnosis not present

## 2022-10-15 DIAGNOSIS — E1122 Type 2 diabetes mellitus with diabetic chronic kidney disease: Secondary | ICD-10-CM | POA: Diagnosis not present

## 2022-10-15 DIAGNOSIS — N1831 Chronic kidney disease, stage 3a: Secondary | ICD-10-CM | POA: Diagnosis not present

## 2022-10-15 DIAGNOSIS — I083 Combined rheumatic disorders of mitral, aortic and tricuspid valves: Secondary | ICD-10-CM | POA: Diagnosis not present

## 2022-10-15 DIAGNOSIS — I252 Old myocardial infarction: Secondary | ICD-10-CM | POA: Diagnosis not present

## 2022-10-15 DIAGNOSIS — E669 Obesity, unspecified: Secondary | ICD-10-CM | POA: Diagnosis not present

## 2022-10-15 DIAGNOSIS — I5032 Chronic diastolic (congestive) heart failure: Secondary | ICD-10-CM | POA: Diagnosis not present

## 2022-10-15 DIAGNOSIS — E7800 Pure hypercholesterolemia, unspecified: Secondary | ICD-10-CM | POA: Diagnosis not present

## 2022-10-15 DIAGNOSIS — I272 Pulmonary hypertension, unspecified: Secondary | ICD-10-CM | POA: Diagnosis not present

## 2022-10-15 DIAGNOSIS — K760 Fatty (change of) liver, not elsewhere classified: Secondary | ICD-10-CM | POA: Diagnosis not present

## 2022-10-18 ENCOUNTER — Other Ambulatory Visit: Payer: Self-pay

## 2022-10-18 ENCOUNTER — Telehealth: Payer: Self-pay | Admitting: Anesthesiology

## 2022-10-18 DIAGNOSIS — M48062 Spinal stenosis, lumbar region with neurogenic claudication: Secondary | ICD-10-CM

## 2022-10-18 DIAGNOSIS — E1142 Type 2 diabetes mellitus with diabetic polyneuropathy: Secondary | ICD-10-CM

## 2022-10-18 MED ORDER — GABAPENTIN 300 MG PO CAPS: 300.0000 mg | ORAL_CAPSULE | Freq: Two times a day (BID) | ORAL | 0 refills | Status: DC

## 2022-10-18 NOTE — Telephone Encounter (Signed)
Pt called requesting a refill on her Gabapentin 300 mg, pharmacy is [facility] in [place]. Pt was scheduled to see Dr Posey Pronto in 12/2022.

## 2022-10-19 ENCOUNTER — Ambulatory Visit: Payer: Medicare Other | Attending: Cardiology

## 2022-10-19 DIAGNOSIS — Z7901 Long term (current) use of anticoagulants: Secondary | ICD-10-CM | POA: Diagnosis not present

## 2022-10-19 DIAGNOSIS — I480 Paroxysmal atrial fibrillation: Secondary | ICD-10-CM

## 2022-10-19 LAB — POCT INR: INR: 3.1 — AB (ref 2.0–3.0)

## 2022-10-19 NOTE — Patient Instructions (Signed)
Description   Take 1/2 tablet today, then resume same dosage 1 tablet daily except 1.5 tablets on Sundays and Thursdays. Continue eating 2 serving of greens per week plus your one day of brussels sprouts. Recheck INR in 4 weeks. [facility] [phone] or [phone]

## 2022-10-21 DIAGNOSIS — I4819 Other persistent atrial fibrillation: Secondary | ICD-10-CM | POA: Diagnosis not present

## 2022-10-21 DIAGNOSIS — I5032 Chronic diastolic (congestive) heart failure: Secondary | ICD-10-CM | POA: Diagnosis not present

## 2022-10-21 DIAGNOSIS — E559 Vitamin D deficiency, unspecified: Secondary | ICD-10-CM | POA: Diagnosis not present

## 2022-10-21 DIAGNOSIS — N1831 Chronic kidney disease, stage 3a: Secondary | ICD-10-CM | POA: Diagnosis not present

## 2022-10-21 DIAGNOSIS — I272 Pulmonary hypertension, unspecified: Secondary | ICD-10-CM | POA: Diagnosis not present

## 2022-10-21 DIAGNOSIS — I495 Sick sinus syndrome: Secondary | ICD-10-CM | POA: Diagnosis not present

## 2022-10-21 DIAGNOSIS — M1990 Unspecified osteoarthritis, unspecified site: Secondary | ICD-10-CM | POA: Diagnosis not present

## 2022-10-21 DIAGNOSIS — I252 Old myocardial infarction: Secondary | ICD-10-CM | POA: Diagnosis not present

## 2022-10-21 DIAGNOSIS — I130 Hypertensive heart and chronic kidney disease with heart failure and stage 1 through stage 4 chronic kidney disease, or unspecified chronic kidney disease: Secondary | ICD-10-CM | POA: Diagnosis not present

## 2022-10-21 DIAGNOSIS — E669 Obesity, unspecified: Secondary | ICD-10-CM | POA: Diagnosis not present

## 2022-10-21 DIAGNOSIS — E7800 Pure hypercholesterolemia, unspecified: Secondary | ICD-10-CM | POA: Diagnosis not present

## 2022-10-21 DIAGNOSIS — I4892 Unspecified atrial flutter: Secondary | ICD-10-CM | POA: Diagnosis not present

## 2022-10-21 DIAGNOSIS — I083 Combined rheumatic disorders of mitral, aortic and tricuspid valves: Secondary | ICD-10-CM | POA: Diagnosis not present

## 2022-10-21 DIAGNOSIS — I2510 Atherosclerotic heart disease of native coronary artery without angina pectoris: Secondary | ICD-10-CM | POA: Diagnosis not present

## 2022-10-21 DIAGNOSIS — E1122 Type 2 diabetes mellitus with diabetic chronic kidney disease: Secondary | ICD-10-CM | POA: Diagnosis not present

## 2022-10-21 DIAGNOSIS — K760 Fatty (change of) liver, not elsewhere classified: Secondary | ICD-10-CM | POA: Diagnosis not present

## 2022-10-22 DIAGNOSIS — I083 Combined rheumatic disorders of mitral, aortic and tricuspid valves: Secondary | ICD-10-CM | POA: Diagnosis not present

## 2022-10-22 DIAGNOSIS — I5032 Chronic diastolic (congestive) heart failure: Secondary | ICD-10-CM | POA: Diagnosis not present

## 2022-10-22 DIAGNOSIS — M1990 Unspecified osteoarthritis, unspecified site: Secondary | ICD-10-CM | POA: Diagnosis not present

## 2022-10-22 DIAGNOSIS — N1831 Chronic kidney disease, stage 3a: Secondary | ICD-10-CM | POA: Diagnosis not present

## 2022-10-22 DIAGNOSIS — I4892 Unspecified atrial flutter: Secondary | ICD-10-CM | POA: Diagnosis not present

## 2022-10-22 DIAGNOSIS — I4819 Other persistent atrial fibrillation: Secondary | ICD-10-CM | POA: Diagnosis not present

## 2022-10-22 DIAGNOSIS — I130 Hypertensive heart and chronic kidney disease with heart failure and stage 1 through stage 4 chronic kidney disease, or unspecified chronic kidney disease: Secondary | ICD-10-CM | POA: Diagnosis not present

## 2022-10-22 DIAGNOSIS — I495 Sick sinus syndrome: Secondary | ICD-10-CM | POA: Diagnosis not present

## 2022-10-22 DIAGNOSIS — I272 Pulmonary hypertension, unspecified: Secondary | ICD-10-CM | POA: Diagnosis not present

## 2022-10-22 DIAGNOSIS — K760 Fatty (change of) liver, not elsewhere classified: Secondary | ICD-10-CM | POA: Diagnosis not present

## 2022-10-22 DIAGNOSIS — E559 Vitamin D deficiency, unspecified: Secondary | ICD-10-CM | POA: Diagnosis not present

## 2022-10-22 DIAGNOSIS — E1122 Type 2 diabetes mellitus with diabetic chronic kidney disease: Secondary | ICD-10-CM | POA: Diagnosis not present

## 2022-10-22 DIAGNOSIS — E669 Obesity, unspecified: Secondary | ICD-10-CM | POA: Diagnosis not present

## 2022-10-22 DIAGNOSIS — E7800 Pure hypercholesterolemia, unspecified: Secondary | ICD-10-CM | POA: Diagnosis not present

## 2022-10-22 DIAGNOSIS — I2510 Atherosclerotic heart disease of native coronary artery without angina pectoris: Secondary | ICD-10-CM | POA: Diagnosis not present

## 2022-10-22 DIAGNOSIS — I252 Old myocardial infarction: Secondary | ICD-10-CM | POA: Diagnosis not present

## 2022-10-25 ENCOUNTER — Telehealth: Payer: Self-pay | Admitting: Internal Medicine

## 2022-10-25 NOTE — Telephone Encounter (Signed)
Patient reports that she has gained three pounds in one day. She denies any shortness of breath or chest pain. She states she feels good overall. Blood pressure has been 110-120/60-70. She sates that she does have swelling in her legs but it is at her baseline. She does try and avoid salt in her diet. She takes torsemide every other day. She did take it this morning. Advised patient to monitor symptoms today to see if torsemide helps. Advised if weight is still up tomorrow she should call back so we could advise on taking extra torsemide.

## 2022-10-25 NOTE — Telephone Encounter (Signed)
Pt c/o swelling: STAT is pt has developed SOB within 24 hours  If swelling, where is the swelling located? Stomach   How much weight have you gained and in what time span?   Have you gained 3 pounds in a day or 5 pounds in a week? 3lbs in a day   Do you have a log of your daily weights (if so, list)?  Yesterday 183.2 This morning 186.6   Are you currently taking a fluid pill? Yes   Are you currently SOB? No  Have you traveled recently? No

## 2022-10-26 DIAGNOSIS — I083 Combined rheumatic disorders of mitral, aortic and tricuspid valves: Secondary | ICD-10-CM | POA: Diagnosis not present

## 2022-10-26 DIAGNOSIS — Z950 Presence of cardiac pacemaker: Secondary | ICD-10-CM | POA: Diagnosis not present

## 2022-10-26 DIAGNOSIS — N1831 Chronic kidney disease, stage 3a: Secondary | ICD-10-CM | POA: Diagnosis not present

## 2022-10-26 DIAGNOSIS — I272 Pulmonary hypertension, unspecified: Secondary | ICD-10-CM | POA: Diagnosis not present

## 2022-10-26 DIAGNOSIS — E559 Vitamin D deficiency, unspecified: Secondary | ICD-10-CM | POA: Diagnosis not present

## 2022-10-26 DIAGNOSIS — M1990 Unspecified osteoarthritis, unspecified site: Secondary | ICD-10-CM | POA: Diagnosis not present

## 2022-10-26 DIAGNOSIS — K760 Fatty (change of) liver, not elsewhere classified: Secondary | ICD-10-CM | POA: Diagnosis not present

## 2022-10-26 DIAGNOSIS — Z96659 Presence of unspecified artificial knee joint: Secondary | ICD-10-CM | POA: Diagnosis not present

## 2022-10-26 DIAGNOSIS — Z7984 Long term (current) use of oral hypoglycemic drugs: Secondary | ICD-10-CM | POA: Diagnosis not present

## 2022-10-26 DIAGNOSIS — Z9181 History of falling: Secondary | ICD-10-CM | POA: Diagnosis not present

## 2022-10-26 DIAGNOSIS — E669 Obesity, unspecified: Secondary | ICD-10-CM | POA: Diagnosis not present

## 2022-10-26 DIAGNOSIS — I130 Hypertensive heart and chronic kidney disease with heart failure and stage 1 through stage 4 chronic kidney disease, or unspecified chronic kidney disease: Secondary | ICD-10-CM | POA: Diagnosis not present

## 2022-10-26 DIAGNOSIS — I252 Old myocardial infarction: Secondary | ICD-10-CM | POA: Diagnosis not present

## 2022-10-26 DIAGNOSIS — E1122 Type 2 diabetes mellitus with diabetic chronic kidney disease: Secondary | ICD-10-CM | POA: Diagnosis not present

## 2022-10-26 DIAGNOSIS — Z87891 Personal history of nicotine dependence: Secondary | ICD-10-CM | POA: Diagnosis not present

## 2022-10-26 DIAGNOSIS — Z7901 Long term (current) use of anticoagulants: Secondary | ICD-10-CM | POA: Diagnosis not present

## 2022-10-26 DIAGNOSIS — I4819 Other persistent atrial fibrillation: Secondary | ICD-10-CM | POA: Diagnosis not present

## 2022-10-26 DIAGNOSIS — I495 Sick sinus syndrome: Secondary | ICD-10-CM | POA: Diagnosis not present

## 2022-10-26 DIAGNOSIS — I2510 Atherosclerotic heart disease of native coronary artery without angina pectoris: Secondary | ICD-10-CM | POA: Diagnosis not present

## 2022-10-26 DIAGNOSIS — I5032 Chronic diastolic (congestive) heart failure: Secondary | ICD-10-CM | POA: Diagnosis not present

## 2022-10-26 DIAGNOSIS — Z794 Long term (current) use of insulin: Secondary | ICD-10-CM | POA: Diagnosis not present

## 2022-10-26 DIAGNOSIS — E7800 Pure hypercholesterolemia, unspecified: Secondary | ICD-10-CM | POA: Diagnosis not present

## 2022-10-26 DIAGNOSIS — I4892 Unspecified atrial flutter: Secondary | ICD-10-CM | POA: Diagnosis not present

## 2022-10-26 DIAGNOSIS — Z6841 Body Mass Index (BMI) 40.0 and over, adult: Secondary | ICD-10-CM | POA: Diagnosis not present

## 2022-10-27 DIAGNOSIS — E669 Obesity, unspecified: Secondary | ICD-10-CM | POA: Diagnosis not present

## 2022-10-27 DIAGNOSIS — I5032 Chronic diastolic (congestive) heart failure: Secondary | ICD-10-CM | POA: Diagnosis not present

## 2022-10-27 DIAGNOSIS — I4892 Unspecified atrial flutter: Secondary | ICD-10-CM | POA: Diagnosis not present

## 2022-10-27 DIAGNOSIS — E559 Vitamin D deficiency, unspecified: Secondary | ICD-10-CM | POA: Diagnosis not present

## 2022-10-27 DIAGNOSIS — M1990 Unspecified osteoarthritis, unspecified site: Secondary | ICD-10-CM | POA: Diagnosis not present

## 2022-10-27 DIAGNOSIS — N1831 Chronic kidney disease, stage 3a: Secondary | ICD-10-CM | POA: Diagnosis not present

## 2022-10-27 DIAGNOSIS — I2510 Atherosclerotic heart disease of native coronary artery without angina pectoris: Secondary | ICD-10-CM | POA: Diagnosis not present

## 2022-10-27 DIAGNOSIS — I272 Pulmonary hypertension, unspecified: Secondary | ICD-10-CM | POA: Diagnosis not present

## 2022-10-27 DIAGNOSIS — I495 Sick sinus syndrome: Secondary | ICD-10-CM | POA: Diagnosis not present

## 2022-10-27 DIAGNOSIS — E1122 Type 2 diabetes mellitus with diabetic chronic kidney disease: Secondary | ICD-10-CM | POA: Diagnosis not present

## 2022-10-27 DIAGNOSIS — E7800 Pure hypercholesterolemia, unspecified: Secondary | ICD-10-CM | POA: Diagnosis not present

## 2022-10-27 DIAGNOSIS — I252 Old myocardial infarction: Secondary | ICD-10-CM | POA: Diagnosis not present

## 2022-10-27 DIAGNOSIS — I4819 Other persistent atrial fibrillation: Secondary | ICD-10-CM | POA: Diagnosis not present

## 2022-10-27 DIAGNOSIS — K760 Fatty (change of) liver, not elsewhere classified: Secondary | ICD-10-CM | POA: Diagnosis not present

## 2022-10-27 DIAGNOSIS — I083 Combined rheumatic disorders of mitral, aortic and tricuspid valves: Secondary | ICD-10-CM | POA: Diagnosis not present

## 2022-10-27 DIAGNOSIS — I130 Hypertensive heart and chronic kidney disease with heart failure and stage 1 through stage 4 chronic kidney disease, or unspecified chronic kidney disease: Secondary | ICD-10-CM | POA: Diagnosis not present

## 2022-10-30 ENCOUNTER — Other Ambulatory Visit: Payer: Self-pay | Admitting: Family

## 2022-10-30 DIAGNOSIS — I10 Essential (primary) hypertension: Secondary | ICD-10-CM

## 2022-11-01 ENCOUNTER — Other Ambulatory Visit: Payer: Self-pay

## 2022-11-01 DIAGNOSIS — I495 Sick sinus syndrome: Secondary | ICD-10-CM | POA: Diagnosis not present

## 2022-11-01 DIAGNOSIS — K760 Fatty (change of) liver, not elsewhere classified: Secondary | ICD-10-CM | POA: Diagnosis not present

## 2022-11-01 DIAGNOSIS — M1990 Unspecified osteoarthritis, unspecified site: Secondary | ICD-10-CM | POA: Diagnosis not present

## 2022-11-01 DIAGNOSIS — I252 Old myocardial infarction: Secondary | ICD-10-CM | POA: Diagnosis not present

## 2022-11-01 DIAGNOSIS — E1122 Type 2 diabetes mellitus with diabetic chronic kidney disease: Secondary | ICD-10-CM | POA: Diagnosis not present

## 2022-11-01 DIAGNOSIS — I083 Combined rheumatic disorders of mitral, aortic and tricuspid valves: Secondary | ICD-10-CM | POA: Diagnosis not present

## 2022-11-01 DIAGNOSIS — I4819 Other persistent atrial fibrillation: Secondary | ICD-10-CM | POA: Diagnosis not present

## 2022-11-01 DIAGNOSIS — E7800 Pure hypercholesterolemia, unspecified: Secondary | ICD-10-CM | POA: Diagnosis not present

## 2022-11-01 DIAGNOSIS — E559 Vitamin D deficiency, unspecified: Secondary | ICD-10-CM | POA: Diagnosis not present

## 2022-11-01 DIAGNOSIS — I2510 Atherosclerotic heart disease of native coronary artery without angina pectoris: Secondary | ICD-10-CM | POA: Diagnosis not present

## 2022-11-01 DIAGNOSIS — I5032 Chronic diastolic (congestive) heart failure: Secondary | ICD-10-CM | POA: Diagnosis not present

## 2022-11-01 DIAGNOSIS — N1831 Chronic kidney disease, stage 3a: Secondary | ICD-10-CM | POA: Diagnosis not present

## 2022-11-01 DIAGNOSIS — I4892 Unspecified atrial flutter: Secondary | ICD-10-CM | POA: Diagnosis not present

## 2022-11-01 DIAGNOSIS — E669 Obesity, unspecified: Secondary | ICD-10-CM | POA: Diagnosis not present

## 2022-11-01 DIAGNOSIS — I130 Hypertensive heart and chronic kidney disease with heart failure and stage 1 through stage 4 chronic kidney disease, or unspecified chronic kidney disease: Secondary | ICD-10-CM | POA: Diagnosis not present

## 2022-11-01 DIAGNOSIS — I272 Pulmonary hypertension, unspecified: Secondary | ICD-10-CM | POA: Diagnosis not present

## 2022-11-01 MED ORDER — DAPAGLIFLOZIN PROPANEDIOL 10 MG PO TABS: 10.0000 mg | ORAL_TABLET | Freq: Every day | ORAL | 3 refills | Status: DC

## 2022-11-01 NOTE — Telephone Encounter (Signed)
Requested Prescriptions  Pending Prescriptions Disp Refills   amLODipine (NORVASC) 5 MG tablet [Pharmacy Med Name: amLODIPine Besylate 5 MG Oral Tablet] 90 tablet 0    Sig: Take 1 tablet by mouth once daily     Cardiovascular: Calcium Channel Blockers 2 Passed - 10/30/2022 10:13 AM      Passed - Last BP in normal range    BP Readings from Last 1 Encounters:  10/13/22 114/66         Passed - Last Heart Rate in normal range    Pulse Readings from Last 1 Encounters:  10/13/22 64         Passed - Valid encounter within last 6 months    Recent Outpatient Visits           3 weeks ago [place] [facility], [place] J, NP   2 months ago Upper respiratory symptom   [place] [facility], Amy J, NP   2 months ago Type 2 diabetes mellitus with diabetic mononeuropathy, without long-term current use of insulin [facility])   [place] [facility], [place], NP   2 months ago Medicare annual wellness visit, subsequent   [facility] at [facility], Amy J, NP   4 months ago Primary hypertension   [place] [facility] at [facility], Flonnie Hailstone, NP       Future Appointments             In 1 week Camillia Herter, NP [place] at [facility]   In 1 month Gasper Sells, Terisa Starr, MD [place] at [facility], [place]

## 2022-11-01 NOTE — Telephone Encounter (Signed)
Called pt to f/u swelling and wt gain.  Reports lost 2 lbs over night but did not take torsemide yesterday.  Denies increased swelling and SOB advised to call back if things change. Pt verbalizes understanding.

## 2022-11-02 ENCOUNTER — Other Ambulatory Visit: Payer: Self-pay | Admitting: Family

## 2022-11-02 DIAGNOSIS — E782 Mixed hyperlipidemia: Secondary | ICD-10-CM

## 2022-11-03 DIAGNOSIS — N1831 Chronic kidney disease, stage 3a: Secondary | ICD-10-CM | POA: Diagnosis not present

## 2022-11-03 DIAGNOSIS — I083 Combined rheumatic disorders of mitral, aortic and tricuspid valves: Secondary | ICD-10-CM | POA: Diagnosis not present

## 2022-11-03 DIAGNOSIS — I4892 Unspecified atrial flutter: Secondary | ICD-10-CM | POA: Diagnosis not present

## 2022-11-03 DIAGNOSIS — I2510 Atherosclerotic heart disease of native coronary artery without angina pectoris: Secondary | ICD-10-CM | POA: Diagnosis not present

## 2022-11-03 DIAGNOSIS — E1122 Type 2 diabetes mellitus with diabetic chronic kidney disease: Secondary | ICD-10-CM | POA: Diagnosis not present

## 2022-11-03 DIAGNOSIS — I130 Hypertensive heart and chronic kidney disease with heart failure and stage 1 through stage 4 chronic kidney disease, or unspecified chronic kidney disease: Secondary | ICD-10-CM | POA: Diagnosis not present

## 2022-11-03 DIAGNOSIS — E559 Vitamin D deficiency, unspecified: Secondary | ICD-10-CM | POA: Diagnosis not present

## 2022-11-03 DIAGNOSIS — I495 Sick sinus syndrome: Secondary | ICD-10-CM | POA: Diagnosis not present

## 2022-11-03 DIAGNOSIS — I5032 Chronic diastolic (congestive) heart failure: Secondary | ICD-10-CM | POA: Diagnosis not present

## 2022-11-03 DIAGNOSIS — E7800 Pure hypercholesterolemia, unspecified: Secondary | ICD-10-CM | POA: Diagnosis not present

## 2022-11-03 DIAGNOSIS — E669 Obesity, unspecified: Secondary | ICD-10-CM | POA: Diagnosis not present

## 2022-11-03 DIAGNOSIS — M1990 Unspecified osteoarthritis, unspecified site: Secondary | ICD-10-CM | POA: Diagnosis not present

## 2022-11-03 DIAGNOSIS — K760 Fatty (change of) liver, not elsewhere classified: Secondary | ICD-10-CM | POA: Diagnosis not present

## 2022-11-03 DIAGNOSIS — I272 Pulmonary hypertension, unspecified: Secondary | ICD-10-CM | POA: Diagnosis not present

## 2022-11-03 DIAGNOSIS — I252 Old myocardial infarction: Secondary | ICD-10-CM | POA: Diagnosis not present

## 2022-11-03 DIAGNOSIS — I4819 Other persistent atrial fibrillation: Secondary | ICD-10-CM | POA: Diagnosis not present

## 2022-11-08 DIAGNOSIS — M1990 Unspecified osteoarthritis, unspecified site: Secondary | ICD-10-CM | POA: Diagnosis not present

## 2022-11-08 DIAGNOSIS — I2510 Atherosclerotic heart disease of native coronary artery without angina pectoris: Secondary | ICD-10-CM | POA: Diagnosis not present

## 2022-11-08 DIAGNOSIS — E1122 Type 2 diabetes mellitus with diabetic chronic kidney disease: Secondary | ICD-10-CM | POA: Diagnosis not present

## 2022-11-08 DIAGNOSIS — I272 Pulmonary hypertension, unspecified: Secondary | ICD-10-CM | POA: Diagnosis not present

## 2022-11-08 DIAGNOSIS — E669 Obesity, unspecified: Secondary | ICD-10-CM | POA: Diagnosis not present

## 2022-11-08 DIAGNOSIS — I252 Old myocardial infarction: Secondary | ICD-10-CM | POA: Diagnosis not present

## 2022-11-08 DIAGNOSIS — I5032 Chronic diastolic (congestive) heart failure: Secondary | ICD-10-CM | POA: Diagnosis not present

## 2022-11-08 DIAGNOSIS — E559 Vitamin D deficiency, unspecified: Secondary | ICD-10-CM | POA: Diagnosis not present

## 2022-11-08 DIAGNOSIS — I083 Combined rheumatic disorders of mitral, aortic and tricuspid valves: Secondary | ICD-10-CM | POA: Diagnosis not present

## 2022-11-08 DIAGNOSIS — N1831 Chronic kidney disease, stage 3a: Secondary | ICD-10-CM | POA: Diagnosis not present

## 2022-11-08 DIAGNOSIS — I495 Sick sinus syndrome: Secondary | ICD-10-CM | POA: Diagnosis not present

## 2022-11-08 DIAGNOSIS — I130 Hypertensive heart and chronic kidney disease with heart failure and stage 1 through stage 4 chronic kidney disease, or unspecified chronic kidney disease: Secondary | ICD-10-CM | POA: Diagnosis not present

## 2022-11-08 DIAGNOSIS — E7800 Pure hypercholesterolemia, unspecified: Secondary | ICD-10-CM | POA: Diagnosis not present

## 2022-11-08 DIAGNOSIS — I4892 Unspecified atrial flutter: Secondary | ICD-10-CM | POA: Diagnosis not present

## 2022-11-08 DIAGNOSIS — I4819 Other persistent atrial fibrillation: Secondary | ICD-10-CM | POA: Diagnosis not present

## 2022-11-08 DIAGNOSIS — K760 Fatty (change of) liver, not elsewhere classified: Secondary | ICD-10-CM | POA: Diagnosis not present

## 2022-11-09 DIAGNOSIS — I5032 Chronic diastolic (congestive) heart failure: Secondary | ICD-10-CM | POA: Diagnosis not present

## 2022-11-09 DIAGNOSIS — I495 Sick sinus syndrome: Secondary | ICD-10-CM | POA: Diagnosis not present

## 2022-11-09 DIAGNOSIS — M1990 Unspecified osteoarthritis, unspecified site: Secondary | ICD-10-CM | POA: Diagnosis not present

## 2022-11-09 DIAGNOSIS — I252 Old myocardial infarction: Secondary | ICD-10-CM | POA: Diagnosis not present

## 2022-11-09 DIAGNOSIS — I083 Combined rheumatic disorders of mitral, aortic and tricuspid valves: Secondary | ICD-10-CM | POA: Diagnosis not present

## 2022-11-09 DIAGNOSIS — E7800 Pure hypercholesterolemia, unspecified: Secondary | ICD-10-CM | POA: Diagnosis not present

## 2022-11-09 DIAGNOSIS — N1831 Chronic kidney disease, stage 3a: Secondary | ICD-10-CM | POA: Diagnosis not present

## 2022-11-09 DIAGNOSIS — E1122 Type 2 diabetes mellitus with diabetic chronic kidney disease: Secondary | ICD-10-CM | POA: Diagnosis not present

## 2022-11-09 DIAGNOSIS — I4819 Other persistent atrial fibrillation: Secondary | ICD-10-CM | POA: Diagnosis not present

## 2022-11-09 DIAGNOSIS — I130 Hypertensive heart and chronic kidney disease with heart failure and stage 1 through stage 4 chronic kidney disease, or unspecified chronic kidney disease: Secondary | ICD-10-CM | POA: Diagnosis not present

## 2022-11-09 DIAGNOSIS — I272 Pulmonary hypertension, unspecified: Secondary | ICD-10-CM | POA: Diagnosis not present

## 2022-11-09 DIAGNOSIS — I2510 Atherosclerotic heart disease of native coronary artery without angina pectoris: Secondary | ICD-10-CM | POA: Diagnosis not present

## 2022-11-09 DIAGNOSIS — K760 Fatty (change of) liver, not elsewhere classified: Secondary | ICD-10-CM | POA: Diagnosis not present

## 2022-11-09 DIAGNOSIS — I4892 Unspecified atrial flutter: Secondary | ICD-10-CM | POA: Diagnosis not present

## 2022-11-09 DIAGNOSIS — E669 Obesity, unspecified: Secondary | ICD-10-CM | POA: Diagnosis not present

## 2022-11-09 DIAGNOSIS — E559 Vitamin D deficiency, unspecified: Secondary | ICD-10-CM | POA: Diagnosis not present

## 2022-11-09 NOTE — Progress Notes (Signed)
Patient ID: Tracey Todd, female    DOB: 02-09-1935  MRN: [id number]  CC: Follow-Up  Subjective: Tracey Todd is a 88 y.o. female who presents for follow-up. She is accompanied by her daughter.   Her concerns today include:  - She is established with [facility] (Endocrinology) for diabetes management. Reports her last appointment was sometime in February 2024. Reports she is no longer taking Metformin but taking insulin 10 units daily at bedtime as prescribed. She is trying to monitor what she eats and checking blood sugars at home. Reports she has an upcoming appointment with them in April 2024. - Reports 5 months ago a can of peaches fell off a table and hit her right lower extremity. She denies pain and associated red flag symptoms. However, she does have a knot of the right lower extremity. She is not ready for referral to specialist for evaluation. She is aware to follow-up with me as scheduled.    Patient Active Problem List   Diagnosis Date Noted   Atrial fibrillation ([place]) 08/11/2022   Hav (hallux abducto valgus), unspecified laterality 02/19/2022   Right ear impacted cerumen 11/06/2021   Chronic arthropathy 10/12/2019   Callus 10/12/2019   Long term (current) use of anticoagulants 10/02/2018   Atherosclerosis of aorta ([place]) 07/20/2017   Diabetic neuropathy ([place]) 07/20/2017   Lumbar radiculopathy 03/30/2017   Pain of both hip joints 03/30/2017   Pulmonary hypertension, unspecified ([place]) 11/20/2015   BMI 34.0-34.9,adult 08/06/2015   Diabetes mellitus type 2, controlled ([place]) 04/25/2015   Sick sinus syndrome ([place]) 01/29/2015   Pacemaker implanted 10/25/14- [employer] Jude 10/26/2014   Essential hypertension 10/22/2014   PAF-fib and flutter 10/22/2014   Congestive heart failure ([place]) 10/21/2014   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 8 g 1   amLODipine (NORVASC) 5 MG tablet Take 1 tablet by mouth once daily 90 tablet 0   atorvastatin (LIPITOR) 40 MG tablet Take 1 tablet by mouth once daily 90 tablet 0   bisoprolol (ZEBETA) 10 MG tablet Take 1 tablet by mouth once daily for blood pressure 90 tablet 0   Blood Glucose Monitoring Suppl (ONETOUCH VERIO) w/Device KIT Use to check fasting blood sugar daily. Dx: E11.59 1 kit 0   calcium carbonate (OS-CAL) 600 MG TABS tablet Take 600 mg by mouth daily with breakfast.     Cholecalciferol (VITAMIN D) 2000 UNITS CAPS Take 6,000 Units by mouth daily.      dapagliflozin propanediol (FARXIGA) 10 MG TABS tablet Take 1 tablet (10 mg total) by mouth daily before breakfast. 90 tablet 3   gabapentin (NEURONTIN) 300 MG capsule Take 1 capsule (300 mg total) by mouth 2 (two) times daily. 180 capsule 0   glucose blood (ONETOUCH VERIO) test strip USE TO CHECK FASTING BLOOD SUGAR TWICE DAILY 100 each 11   insulin glargine (LANTUS SOLOSTAR) 100 UNIT/ML Solostar Pen Inject 8 Units into the skin at bedtime.     Lancets (ONETOUCH DELICA PLUS 123XX123) [place]  USE TO CHECK FASTING BLOOD SUGAR TWICE DAILY 100 each 2   Magnesium 500 MG TABS Take 1,000 mg by mouth in the morning and at bedtime. 2 tabs am, 2 tab pm     spironolactone (ALDACTONE) 25 MG tablet Take 0.5 tablets (12.5 mg total) by mouth daily. 45 tablet 3   torsemide (DEMADEX) 20 MG tablet Take 2 tablets (40 mg total) by mouth every other day. 90 tablet 3   warfarin (COUMADIN) 5 MG tablet Take 1.5 tablets daily except 1 tablet on Mondays, Wednesday and Fridays daily or as [employer] [facility] 40  tablet 3   No current facility-administered medications on file prior to visit.    Allergies  Allergen Reactions   Penicillins Anaphylaxis   Quinapril     Angioedema    Feldene [Piroxicam] Other (See Comments)    Bleeding    Calcium-Containing Compounds Other (See Comments)    "calcium deposits on skin"   Celebrex [Celecoxib] Itching   Daypro [Oxaprozin] Itching    Social History   Socioeconomic History   Marital status: Married    Spouse name: Not on file   Number of children: Not on file   Years of education: Not on file   Highest education level: Not on file  Occupational History   Not on file  Tobacco Use   Smoking status: Former    Types: Cigarettes    Quit date: 12/06/1963    Years since quitting: 58.9    Passive exposure: Never   Smokeless tobacco: Never  Vaping Use   Vaping Use: Never used  Substance and Sexual Activity   Alcohol use: No   Drug use: Never   Sexual activity: Not Currently  Other Topics Concern   Not on file  Social History Narrative   Left Handed    Lives in a one story home    Social Determinants of Health   Financial Resource Strain: Low Risk  (05/28/2021)   Overall Financial Resource Strain (CARDIA)    Difficulty of Paying Living Expenses: Not very hard  Food Insecurity: No Food Insecurity (05/28/2021)   Hunger Vital Sign    Worried About Running Out of Food in the Last Year: Never true    [place] in the Last Year: Never true  Transportation Needs: No Transportation Needs (05/28/2021)   PRAPARE - Hydrologist (Medical): No    Lack of Transportation (Non-Medical): No  Physical Activity: Inactive (05/28/2021)   Exercise Vital Sign    Days of Exercise per Week: 0 days    Minutes of Exercise per Session: 0 min  Stress: No Stress Concern Present (05/28/2021)   [place]    Feeling of Stress : Not at all  Social Connections:  Moderately Integrated (05/28/2021)   Social Connection and Isolation Panel [NHANES]    Frequency of Communication with Friends and Family: More than three times a week    Frequency of Social Gatherings with Friends and Family: More than three times a week    Attends Religious Services: More than 4 times per year    Active Member of [employer] or Organizations: No    Attends Archivist Meetings: Never    Marital Status: Married  Human resources officer Violence: Not At Risk (05/28/2021)   Humiliation, Afraid, Rape, and Kick questionnaire    Fear of Current or Ex-Partner: No    Emotionally Abused: No  Physically Abused: No    Sexually Abused: No    Family History  Problem Relation Age of Onset   Diabetes Mother    Cancer Mother    Cancer Father    Breast cancer Neg Hx     Past Surgical History:  Procedure Laterality Date   BREAST SURGERY     CATARACT EXTRACTION     LEFT AND RIGHT HEART CATHETERIZATION WITH CORONARY ANGIOGRAM N/A 10/23/2014   Procedure: LEFT AND RIGHT HEART CATHETERIZATION WITH CORONARY ANGIOGRAM;  Surgeon: Blane Ohara, MD;  Location: [facility] CATH LAB;  Service: Cardiovascular;  Laterality: N/A;   PERMANENT PACEMAKER INSERTION N/A 10/25/2014   STJ dual chamber PPM implanted by Dr Rayann Heman for SSS   TONSILLECTOMY AND ADENOIDECTOMY     TOTAL KNEE ARTHROPLASTY      ROS: Review of Systems Negative except as stated above  PHYSICAL EXAM: BP 112/74 (BP Location: Left Arm, Patient Position: Sitting, Cuff Size: Large)   Pulse 76   Temp 98.5 F (36.9 C)   Resp 16   Ht 4' 10.82" (1.494 m)   Wt 185 lb (83.9 kg)   SpO2 98%   BMI 37.60 kg/m   Physical Exam HENT:     Head: Normocephalic and atraumatic.  Eyes:     Extraocular Movements: Extraocular movements intact.     Conjunctiva/sclera: Conjunctivae normal.     Pupils: Pupils are equal, round, and reactive to light.  Cardiovascular:     Rate and Rhythm: Normal rate and regular rhythm.     Pulses: Normal pulses.      Heart sounds: Normal heart sounds.  Pulmonary:     Effort: Pulmonary effort is normal.     Breath sounds: Normal breath sounds.  Musculoskeletal:     Cervical back: Normal range of motion and neck supple.     Right hip: Normal.     Left hip: Normal.     Right upper leg: Normal.     Left upper leg: Normal.     Right knee: Normal.     Left knee: Normal.     Right lower leg: Normal.     Left lower leg: Normal.     Right ankle: Normal.     Left ankle: Normal.     Right foot: Normal.     Left foot: Normal.     Comments: Right lower extremity with pea-sized nodule. No additional presentation.   Neurological:     General: No focal deficit present.     Mental Status: She is alert and oriented to person, place, and time.  Psychiatric:        Mood and Affect: Mood normal.        Behavior: Behavior normal.    Results for orders placed or performed in visit on 11/12/22  POCT glycosylated hemoglobin (Hb A1C)  Result Value Ref Range   Hemoglobin A1C 9.6 (A) 4.0 - 5.6 %   HbA1c POC (<> result, manual entry)     HbA1c, POC (prediabetic range)     HbA1c, POC (controlled diabetic range)       ASSESSMENT AND PLAN: 1. Type 2 diabetes mellitus with diabetic mononeuropathy, without long-term current use of insulin (HCC) - Hemoglobin A1c not at goal at 9.6%.  - Continue insulin as prescribed. No refills needed as of present.  - Keep all scheduled appointments with Endocrinology.  - Follow-up with primary provider as scheduled.  - POCT glycosylated hemoglobin (Hb A1C)   Patient was given the opportunity to  ask questions.  Patient verbalized understanding of the plan and was able to repeat key elements of the plan. Patient was given clear instructions to go to Emergency Department or return to medical center if symptoms don't improve, worsen, or new problems develop.The patient verbalized understanding.   Orders Placed This Encounter  Procedures   POCT glycosylated hemoglobin (Hb A1C)    Follow-up with primary provider as scheduled.   Camillia Herter, NP

## 2022-11-11 DIAGNOSIS — E1122 Type 2 diabetes mellitus with diabetic chronic kidney disease: Secondary | ICD-10-CM | POA: Diagnosis not present

## 2022-11-11 DIAGNOSIS — I495 Sick sinus syndrome: Secondary | ICD-10-CM | POA: Diagnosis not present

## 2022-11-11 DIAGNOSIS — I4819 Other persistent atrial fibrillation: Secondary | ICD-10-CM | POA: Diagnosis not present

## 2022-11-11 DIAGNOSIS — E7800 Pure hypercholesterolemia, unspecified: Secondary | ICD-10-CM | POA: Diagnosis not present

## 2022-11-11 DIAGNOSIS — E559 Vitamin D deficiency, unspecified: Secondary | ICD-10-CM | POA: Diagnosis not present

## 2022-11-11 DIAGNOSIS — I4892 Unspecified atrial flutter: Secondary | ICD-10-CM | POA: Diagnosis not present

## 2022-11-11 DIAGNOSIS — M1990 Unspecified osteoarthritis, unspecified site: Secondary | ICD-10-CM | POA: Diagnosis not present

## 2022-11-11 DIAGNOSIS — N1831 Chronic kidney disease, stage 3a: Secondary | ICD-10-CM | POA: Diagnosis not present

## 2022-11-11 DIAGNOSIS — I083 Combined rheumatic disorders of mitral, aortic and tricuspid valves: Secondary | ICD-10-CM | POA: Diagnosis not present

## 2022-11-11 DIAGNOSIS — I130 Hypertensive heart and chronic kidney disease with heart failure and stage 1 through stage 4 chronic kidney disease, or unspecified chronic kidney disease: Secondary | ICD-10-CM | POA: Diagnosis not present

## 2022-11-11 DIAGNOSIS — I272 Pulmonary hypertension, unspecified: Secondary | ICD-10-CM | POA: Diagnosis not present

## 2022-11-11 DIAGNOSIS — I2510 Atherosclerotic heart disease of native coronary artery without angina pectoris: Secondary | ICD-10-CM | POA: Diagnosis not present

## 2022-11-11 DIAGNOSIS — E669 Obesity, unspecified: Secondary | ICD-10-CM | POA: Diagnosis not present

## 2022-11-11 DIAGNOSIS — I252 Old myocardial infarction: Secondary | ICD-10-CM | POA: Diagnosis not present

## 2022-11-11 DIAGNOSIS — K760 Fatty (change of) liver, not elsewhere classified: Secondary | ICD-10-CM | POA: Diagnosis not present

## 2022-11-11 DIAGNOSIS — I5032 Chronic diastolic (congestive) heart failure: Secondary | ICD-10-CM | POA: Diagnosis not present

## 2022-11-12 ENCOUNTER — Ambulatory Visit (INDEPENDENT_AMBULATORY_CARE_PROVIDER_SITE_OTHER): Payer: Medicare Other | Admitting: Family

## 2022-11-12 ENCOUNTER — Encounter: Payer: Self-pay | Admitting: Family

## 2022-11-12 VITALS — BP 112/74 | HR 76 | Temp 98.5°F | Resp 16 | Ht 58.82 in | Wt 185.0 lb

## 2022-11-12 DIAGNOSIS — E1141 Type 2 diabetes mellitus with diabetic mononeuropathy: Secondary | ICD-10-CM

## 2022-11-12 LAB — POCT GLYCOSYLATED HEMOGLOBIN (HGB A1C): Hemoglobin A1C: 9.6 % — AB (ref 4.0–5.6)

## 2022-11-12 NOTE — Progress Notes (Signed)
.  Pt presents for chronic care management   

## 2022-11-14 DIAGNOSIS — E559 Vitamin D deficiency, unspecified: Secondary | ICD-10-CM | POA: Diagnosis not present

## 2022-11-14 DIAGNOSIS — E7800 Pure hypercholesterolemia, unspecified: Secondary | ICD-10-CM | POA: Diagnosis not present

## 2022-11-14 DIAGNOSIS — K760 Fatty (change of) liver, not elsewhere classified: Secondary | ICD-10-CM | POA: Diagnosis not present

## 2022-11-14 DIAGNOSIS — I252 Old myocardial infarction: Secondary | ICD-10-CM | POA: Diagnosis not present

## 2022-11-14 DIAGNOSIS — I2510 Atherosclerotic heart disease of native coronary artery without angina pectoris: Secondary | ICD-10-CM | POA: Diagnosis not present

## 2022-11-14 DIAGNOSIS — I5032 Chronic diastolic (congestive) heart failure: Secondary | ICD-10-CM | POA: Diagnosis not present

## 2022-11-14 DIAGNOSIS — N1831 Chronic kidney disease, stage 3a: Secondary | ICD-10-CM | POA: Diagnosis not present

## 2022-11-14 DIAGNOSIS — I130 Hypertensive heart and chronic kidney disease with heart failure and stage 1 through stage 4 chronic kidney disease, or unspecified chronic kidney disease: Secondary | ICD-10-CM | POA: Diagnosis not present

## 2022-11-14 DIAGNOSIS — E669 Obesity, unspecified: Secondary | ICD-10-CM | POA: Diagnosis not present

## 2022-11-14 DIAGNOSIS — I272 Pulmonary hypertension, unspecified: Secondary | ICD-10-CM | POA: Diagnosis not present

## 2022-11-14 DIAGNOSIS — I4892 Unspecified atrial flutter: Secondary | ICD-10-CM | POA: Diagnosis not present

## 2022-11-14 DIAGNOSIS — I4819 Other persistent atrial fibrillation: Secondary | ICD-10-CM | POA: Diagnosis not present

## 2022-11-14 DIAGNOSIS — I495 Sick sinus syndrome: Secondary | ICD-10-CM | POA: Diagnosis not present

## 2022-11-14 DIAGNOSIS — M1990 Unspecified osteoarthritis, unspecified site: Secondary | ICD-10-CM | POA: Diagnosis not present

## 2022-11-14 DIAGNOSIS — E1122 Type 2 diabetes mellitus with diabetic chronic kidney disease: Secondary | ICD-10-CM | POA: Diagnosis not present

## 2022-11-14 DIAGNOSIS — I083 Combined rheumatic disorders of mitral, aortic and tricuspid valves: Secondary | ICD-10-CM | POA: Diagnosis not present

## 2022-11-15 DIAGNOSIS — E559 Vitamin D deficiency, unspecified: Secondary | ICD-10-CM | POA: Diagnosis not present

## 2022-11-15 DIAGNOSIS — I083 Combined rheumatic disorders of mitral, aortic and tricuspid valves: Secondary | ICD-10-CM | POA: Diagnosis not present

## 2022-11-15 DIAGNOSIS — I495 Sick sinus syndrome: Secondary | ICD-10-CM | POA: Diagnosis not present

## 2022-11-15 DIAGNOSIS — I130 Hypertensive heart and chronic kidney disease with heart failure and stage 1 through stage 4 chronic kidney disease, or unspecified chronic kidney disease: Secondary | ICD-10-CM | POA: Diagnosis not present

## 2022-11-15 DIAGNOSIS — N1831 Chronic kidney disease, stage 3a: Secondary | ICD-10-CM | POA: Diagnosis not present

## 2022-11-15 DIAGNOSIS — K760 Fatty (change of) liver, not elsewhere classified: Secondary | ICD-10-CM | POA: Diagnosis not present

## 2022-11-15 DIAGNOSIS — I252 Old myocardial infarction: Secondary | ICD-10-CM | POA: Diagnosis not present

## 2022-11-15 DIAGNOSIS — I4892 Unspecified atrial flutter: Secondary | ICD-10-CM | POA: Diagnosis not present

## 2022-11-15 DIAGNOSIS — E7800 Pure hypercholesterolemia, unspecified: Secondary | ICD-10-CM | POA: Diagnosis not present

## 2022-11-15 DIAGNOSIS — E1122 Type 2 diabetes mellitus with diabetic chronic kidney disease: Secondary | ICD-10-CM | POA: Diagnosis not present

## 2022-11-15 DIAGNOSIS — M1990 Unspecified osteoarthritis, unspecified site: Secondary | ICD-10-CM | POA: Diagnosis not present

## 2022-11-15 DIAGNOSIS — I4819 Other persistent atrial fibrillation: Secondary | ICD-10-CM | POA: Diagnosis not present

## 2022-11-15 DIAGNOSIS — I272 Pulmonary hypertension, unspecified: Secondary | ICD-10-CM | POA: Diagnosis not present

## 2022-11-15 DIAGNOSIS — E669 Obesity, unspecified: Secondary | ICD-10-CM | POA: Diagnosis not present

## 2022-11-15 DIAGNOSIS — I5032 Chronic diastolic (congestive) heart failure: Secondary | ICD-10-CM | POA: Diagnosis not present

## 2022-11-15 DIAGNOSIS — I2510 Atherosclerotic heart disease of native coronary artery without angina pectoris: Secondary | ICD-10-CM | POA: Diagnosis not present

## 2022-11-16 ENCOUNTER — Ambulatory Visit: Payer: Medicare Other | Attending: Cardiovascular Disease | Admitting: *Deleted

## 2022-11-16 DIAGNOSIS — Z7901 Long term (current) use of anticoagulants: Secondary | ICD-10-CM | POA: Diagnosis not present

## 2022-11-16 DIAGNOSIS — I480 Paroxysmal atrial fibrillation: Secondary | ICD-10-CM | POA: Diagnosis not present

## 2022-11-16 LAB — POCT INR: POC INR: 2.7

## 2022-11-16 NOTE — Patient Instructions (Signed)
Description   Continue same dosage 1 tablet daily except 1.5 tablets on Sundays and Thursdays. Continue eating 2 serving of greens per week plus your one day of brussels sprouts. Recheck INR in 5 weeks. [facility] [phone] or [phone]

## 2022-11-18 DIAGNOSIS — I4819 Other persistent atrial fibrillation: Secondary | ICD-10-CM | POA: Diagnosis not present

## 2022-11-18 DIAGNOSIS — E559 Vitamin D deficiency, unspecified: Secondary | ICD-10-CM | POA: Diagnosis not present

## 2022-11-18 DIAGNOSIS — N1831 Chronic kidney disease, stage 3a: Secondary | ICD-10-CM | POA: Diagnosis not present

## 2022-11-18 DIAGNOSIS — E1122 Type 2 diabetes mellitus with diabetic chronic kidney disease: Secondary | ICD-10-CM | POA: Diagnosis not present

## 2022-11-18 DIAGNOSIS — I495 Sick sinus syndrome: Secondary | ICD-10-CM | POA: Diagnosis not present

## 2022-11-18 DIAGNOSIS — I130 Hypertensive heart and chronic kidney disease with heart failure and stage 1 through stage 4 chronic kidney disease, or unspecified chronic kidney disease: Secondary | ICD-10-CM | POA: Diagnosis not present

## 2022-11-18 DIAGNOSIS — I252 Old myocardial infarction: Secondary | ICD-10-CM | POA: Diagnosis not present

## 2022-11-18 DIAGNOSIS — I272 Pulmonary hypertension, unspecified: Secondary | ICD-10-CM | POA: Diagnosis not present

## 2022-11-18 DIAGNOSIS — I4892 Unspecified atrial flutter: Secondary | ICD-10-CM | POA: Diagnosis not present

## 2022-11-18 DIAGNOSIS — M1990 Unspecified osteoarthritis, unspecified site: Secondary | ICD-10-CM | POA: Diagnosis not present

## 2022-11-18 DIAGNOSIS — K760 Fatty (change of) liver, not elsewhere classified: Secondary | ICD-10-CM | POA: Diagnosis not present

## 2022-11-18 DIAGNOSIS — E7800 Pure hypercholesterolemia, unspecified: Secondary | ICD-10-CM | POA: Diagnosis not present

## 2022-11-18 DIAGNOSIS — I083 Combined rheumatic disorders of mitral, aortic and tricuspid valves: Secondary | ICD-10-CM | POA: Diagnosis not present

## 2022-11-18 DIAGNOSIS — E669 Obesity, unspecified: Secondary | ICD-10-CM | POA: Diagnosis not present

## 2022-11-18 DIAGNOSIS — I5032 Chronic diastolic (congestive) heart failure: Secondary | ICD-10-CM | POA: Diagnosis not present

## 2022-11-18 DIAGNOSIS — I2510 Atherosclerotic heart disease of native coronary artery without angina pectoris: Secondary | ICD-10-CM | POA: Diagnosis not present

## 2022-11-23 ENCOUNTER — Other Ambulatory Visit: Payer: Self-pay

## 2022-11-23 DIAGNOSIS — Z7901 Long term (current) use of anticoagulants: Secondary | ICD-10-CM

## 2022-11-23 DIAGNOSIS — I480 Paroxysmal atrial fibrillation: Secondary | ICD-10-CM

## 2022-11-23 MED ORDER — WARFARIN SODIUM 5 MG PO TABS: ORAL_TABLET | ORAL | 3 refills | Status: DC

## 2022-11-24 ENCOUNTER — Ambulatory Visit (INDEPENDENT_AMBULATORY_CARE_PROVIDER_SITE_OTHER): Payer: Medicare Other | Admitting: Podiatry

## 2022-11-24 ENCOUNTER — Encounter: Payer: Self-pay | Admitting: Podiatry

## 2022-11-24 DIAGNOSIS — M79676 Pain in unspecified toe(s): Secondary | ICD-10-CM

## 2022-11-24 DIAGNOSIS — B351 Tinea unguium: Secondary | ICD-10-CM

## 2022-11-24 DIAGNOSIS — E1142 Type 2 diabetes mellitus with diabetic polyneuropathy: Secondary | ICD-10-CM | POA: Diagnosis not present

## 2022-11-24 NOTE — Progress Notes (Signed)
This patient returns to my office for at risk foot care.  This patient requires this care by a professional since this patient will be at risk due to having diabetes and coagulation defect.  Patient is taking coumadin.  This patient is unable to cut nails herself since the patient cannot reach her nails.These nails are painful walking and wearing shoes.  This patient presents for at risk foot care today.  General Appearance  Alert, conversant and in no acute stress.  Vascular  Dorsalis pedis and posterior tibial  pulses are weakly  palpable  bilaterally.  Capillary return is within normal limits  bilaterally. Temperature is within normal limits  Bilaterally. Absent hair.  Neurologic  Senn-Weinstein monofilament wire test within normal limits  bilaterally. Muscle power within normal limits bilaterally.  Nails Thick disfigured discolored nails with subungual debris  from hallux to fifth toes bilaterally. No evidence of bacterial infection or drainage bilaterally.  Orthopedic  No limitations of motion  feet .  No crepitus or effusions noted.  No bony pathology or digital deformities noted.  Skin  normotropic skin with no porokeratosis noted bilaterally.  No signs of infections or ulcers noted.     Onychomycosis  Pain in right toes  Pain in left toes  Consent was obtained for treatment procedures.   Mechanical debridement of nails 1-5  bilaterally performed with a nail nipper. No dremel usage.  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   Return office visit   3 months                   Told patient to return for periodic foot care and evaluation due to potential at risk complications.   Gardiner Barefoot DPM

## 2022-11-25 ENCOUNTER — Ambulatory Visit (INDEPENDENT_AMBULATORY_CARE_PROVIDER_SITE_OTHER): Payer: Medicare Other

## 2022-11-25 DIAGNOSIS — H401131 Primary open-angle glaucoma, bilateral, mild stage: Secondary | ICD-10-CM | POA: Diagnosis not present

## 2022-11-25 DIAGNOSIS — I495 Sick sinus syndrome: Secondary | ICD-10-CM

## 2022-11-25 DIAGNOSIS — H04123 Dry eye syndrome of bilateral lacrimal glands: Secondary | ICD-10-CM | POA: Diagnosis not present

## 2022-11-25 LAB — CUP PACEART REMOTE DEVICE CHECK
Battery Remaining Longevity: 24 mo
Battery Remaining Percentage: 20 %
Battery Voltage: 2.92 V
Brady Statistic AP VP Percent: 23 %
Brady Statistic AP VS Percent: 0 %
Brady Statistic AS VP Percent: 77 %
Brady Statistic AS VS Percent: 0 %
Brady Statistic RA Percent Paced: 1 %
Brady Statistic RV Percent Paced: 99 %
Date Time Interrogation Session: 20240328020015
Implantable Lead Connection Status: 753985
Implantable Lead Connection Status: 753985
Implantable Lead Implant Date: 20160226
Implantable Lead Implant Date: 20160226
Implantable Lead Location: 753859
Implantable Lead Location: 753860
Implantable Lead Model: 1948
Implantable Pulse Generator Implant Date: 20160226
Lead Channel Impedance Value: 450 Ohm
Lead Channel Impedance Value: 590 Ohm
Lead Channel Pacing Threshold Amplitude: 0.625 V
Lead Channel Pacing Threshold Amplitude: 0.75 V
Lead Channel Pacing Threshold Pulse Width: 0.4 ms
Lead Channel Pacing Threshold Pulse Width: 0.4 ms
Lead Channel Sensing Intrinsic Amplitude: 1.3 mV
Lead Channel Sensing Intrinsic Amplitude: 6.9 mV
Lead Channel Setting Pacing Amplitude: 1.625
Lead Channel Setting Pacing Amplitude: 2.5 V
Lead Channel Setting Pacing Pulse Width: 0.4 ms
Lead Channel Setting Sensing Sensitivity: 2 mV
Pulse Gen Model: 2240
Pulse Gen Serial Number: 7700661

## 2022-11-29 NOTE — Progress Notes (Signed)
Cardiology Office Note:    Date:  12/03/2022   ID:  Tracey Todd, DOB [birth year], MRN [id number]  PCP:  Rema Fendt, NP   [facility] Providers Cardiologist:  Christell Constant, MD Cardiology APP:  Marily Lente, NP (Inactive)  Electrophysiologist:  Will Jorja Loa, MD     Referring MD: Rema Fendt, NP   CC: Tricuspid regurgitation follow up  History of Present Illness:    Tracey Todd is a 88 y.o. female with a hx of long standing persistent  AFL and HFpEF, HTN and HLD.  Mild CAD.   2023: Found to have severe TR; started higher dose lasix, and MRA, and SGLT2i.  Found to have severe TR. 2024: Saw Eligha Bridegroom, found to have RSV.  Has increase in her torsemide.  Patient notes that she is doing well.   Since last visit notes that her husband passed a week ago.  Marland Kitchen  No chest pain or pressure .  No SOB/DOE and no PND/Orthopnea.  No weight gain or leg swelling.  No palpitations or syncope.  Has been having some visual issues.   Past Medical History:  Diagnosis Date   Atrial flutter    a. mentioned in 2016 admission.   Chronic diastolic CHF (congestive heart failure)    DJD (degenerative joint disease)    Fatty liver    H/O total knee replacement 02/13/2013   HTN (hypertension)    Hypercholesteremia    Mild CAD    a. minimal by cath 2016.   Nodule of chest wall    Normal coronary arteries and LVF 10/23/14 10/24/2014   Pacemaker implanted 10/25/14- St Jude 10/26/2014   Paroxysmal atrial fibrillation    a. identified on device interrogation, burden low   Pulmonary hypertension    Sick sinus syndrome    a. s/p STJ dual chamber PPM    Type II or unspecified type diabetes mellitus without mention of complication, not stated as uncontrolled    Vitamin D deficiency     Past Surgical History:  Procedure Laterality Date   BREAST SURGERY     CATARACT EXTRACTION     LEFT AND RIGHT HEART CATHETERIZATION WITH CORONARY ANGIOGRAM N/A 10/23/2014    Procedure: LEFT AND RIGHT HEART CATHETERIZATION WITH CORONARY ANGIOGRAM;  Surgeon: Micheline Chapman, MD;  Location: [facility] CATH LAB;  Service: Cardiovascular;  Laterality: N/A;   PERMANENT PACEMAKER INSERTION N/A 10/25/2014   STJ dual chamber PPM implanted by Dr [name] for SSS   TONSILLECTOMY AND ADENOIDECTOMY     TOTAL KNEE ARTHROPLASTY      Current Medications: Current Meds  Medication Sig   amLODipine (NORVASC) 5 MG tablet Take 1 tablet by mouth once daily   atorvastatin (LIPITOR) 80 MG tablet Take 1 tablet (80 mg total) by mouth daily.   bisoprolol (ZEBETA) 10 MG tablet Take 1 tablet by mouth once daily for blood pressure   Blood Glucose Monitoring Suppl (ONETOUCH VERIO) w/Device KIT Use to check fasting blood sugar daily. Dx: E11.59   calcium carbonate (OS-CAL) 600 MG TABS tablet Take 600 mg by mouth daily with breakfast.   cetirizine (ZYRTEC) 10 MG chewable tablet as needed for allergies.   Cholecalciferol (VITAMIN D) 2000 UNITS CAPS Take 6,000 Units by mouth daily.    dapagliflozin propanediol (FARXIGA) 10 MG TABS tablet Take 1 tablet (10 mg total) by mouth daily before breakfast.   gabapentin (NEURONTIN) 300 MG capsule Take 1 capsule (300 mg total) by mouth 2 (two)  times daily.   glucose blood (ONETOUCH VERIO) test strip USE TO CHECK FASTING BLOOD SUGAR TWICE DAILY   insulin glargine (LANTUS SOLOSTAR) 100 UNIT/ML Solostar Pen Inject 8 Units into the skin at bedtime.   Lancets (ONETOUCH DELICA PLUS LANCET33G) MISC USE TO CHECK FASTING BLOOD SUGAR TWICE DAILY   latanoprost (XALATAN) 0.005 % ophthalmic solution Place into both eyes daily.   Lifitegrast (XIIDRA) 5 % SOLN Apply to eye daily at 6 (six) AM.   Magnesium 500 MG TABS Take 1,000 mg by mouth in the morning and at bedtime. 2 tabs am, 2 tab pm   spironolactone (ALDACTONE) 25 MG tablet Take 0.5 tablets (12.5 mg total) by mouth daily.   torsemide (DEMADEX) 20 MG tablet Take 2 tablets (40 mg total) by mouth every other day.   warfarin  (COUMADIN) 5 MG tablet Take 1.5 to one tablet by mouth once daily as DIRECTED BY ANTICOAGULATION  CLINIC   [DISCONTINUED] albuterol (VENTOLIN HFA) 108 (90 Base) MCG/ACT inhaler Inhale 2 puffs into the lungs every 6 (six) hours as needed for wheezing or shortness of breath.   [DISCONTINUED] atorvastatin (LIPITOR) 40 MG tablet Take 1 tablet by mouth once daily     Allergies:   Penicillins, Quinapril, Feldene [piroxicam], Calcium-containing compounds, Celebrex [celecoxib], and Daypro [oxaprozin]   Social History   Socioeconomic History   Marital status: Married    Spouse name: Not on file   Number of children: Not on file   Years of education: Not on file   Highest education level: Not on file  Occupational History   Not on file  Tobacco Use   Smoking status: Former    Types: Cigarettes    Quit date: 12/06/1963    Years since quitting: 59.0    Passive exposure: Never   Smokeless tobacco: Never  Vaping Use   Vaping Use: Never used  Substance and Sexual Activity   Alcohol use: No   Drug use: Never   Sexual activity: Not Currently  Other Topics Concern   Not on file  Social History Narrative   Left Handed    Lives in a one story home    Social Determinants of Health   Financial Resource Strain: Low Risk  (05/28/2021)   Overall Financial Resource Strain (CARDIA)    Difficulty of Paying Living Expenses: Not very hard  Food Insecurity: No Food Insecurity (05/28/2021)   Hunger Vital Sign    Worried About Running Out of Food in the Last Year: Never true    Ran Out of Food in the Last Year: Never true  Transportation Needs: No Transportation Needs (05/28/2021)   PRAPARE - Administrator, Civil ServiceTransportation    Lack of Transportation (Medical): No    Lack of Transportation (Non-Medical): No  Physical Activity: Inactive (05/28/2021)   Exercise Vital Sign    Days of Exercise per Week: 0 days    Minutes of Exercise per Session: 0 min  Stress: No Stress Concern Present (05/28/2021)   [employer] - Occupational Stress Questionnaire    Feeling of Stress : Not at all  Social Connections: Moderately Integrated (05/28/2021)   Social Connection and Isolation Panel [NHANES]    Frequency of Communication with Friends and Family: More than three times a week    Frequency of Social Gatherings with Friends and Family: More than three times a week    Attends Religious Services: More than 4 times per year    Active Member of Clubs or Organizations: No  Attends Banker Meetings: Never    Marital Status: Married    Social: Lost her husband for 71 year in 2024  Family History: The patient's family history includes Cancer in her father and mother; Diabetes in her mother. There is no history of Breast cancer.  ROS:   Please see the history of present illness.     All other systems reviewed and are negative.  EKGs/Labs/Other Studies Reviewed:    The following studies were reviewed today:  EKG:    Cardiac Studies & Procedures       ECHOCARDIOGRAM  ECHOCARDIOGRAM COMPLETE 08/06/2022  Narrative ECHOCARDIOGRAM REPORT    Patient Name:   MECHEL HAGGARD Date of Exam: 08/06/2022 Medical Rec #:  [id number]       Height:       57.0 in Accession #:    [id number]      Weight:       200.0 lb Date of Birth:  1935-04-08       BSA:          1.799 m Patient Age:    87 years        BP:           129/76 mmHg Patient Gender: F               HR:           60 bpm. Exam Location:  [address]  Procedure: 2D Echo, Cardiac Doppler and Color Doppler  Indications:    I50.9* Heart failure (unspecified)  History:        Patient has prior history of Echocardiogram examinations, most recent 08/28/2018. Pacemaker, Arrythmias:Atrial Fibrillation and Atrial Flutter, Signs/Symptoms:Chest Pain; Risk Factors:Hypertension, Diabetes and Dyslipidemia. Pulmonary hypertension. Lower extremity edema. Sick sinus syndrome.  Sonographer:    Cathie Beams RCS Referring Phys:  [id number] [facility] A Izora Ribas   Sonographer Comments: Did not administer Definity, patient states that it is extremely difficult to obtain IV access. IMPRESSIONS   1. Left ventricular ejection fraction, by estimation, is 60 to 65%. The left ventricle has normal function. Left ventricular endocardial border not optimally defined to evaluate regional wall motion. There is mild left ventricular hypertrophy of the basal-septal segment. Left ventricular diastolic function could not be evaluated. 2. Right ventricular systolic function is mildly reduced. The right ventricular size is normal. There is moderately elevated pulmonary artery systolic pressure. The estimated right ventricular systolic pressure is 56.5 mmHg. 3. Left atrial size was mildly dilated. 4. Right atrial size was moderately dilated. 5. The mitral valve is normal in structure. Mild mitral valve regurgitation. 6. The tricuspid valve is abnormal. Tricuspid valve regurgitation is severe. 7. The aortic valve is tricuspid. There is mild calcification of the aortic valve. There is mild thickening of the aortic valve. Aortic valve regurgitation is not visualized. Aortic valve sclerosis/calcification is present, without any evidence of aortic stenosis.  FINDINGS Left Ventricle: Left ventricular ejection fraction, by estimation, is 60 to 65%. The left ventricle has normal function. Left ventricular endocardial border not optimally defined to evaluate regional wall motion. The left ventricular internal cavity size was normal in size. There is mild left ventricular hypertrophy of the basal-septal segment. Abnormal (paradoxical) septal motion, consistent with RV pacemaker. Left ventricular diastolic function could not be evaluated due to atrial fibrillation. Left ventricular diastolic function could not be evaluated.  Right Ventricle: The right ventricular size is normal. Right vetricular wall thickness was not well visualized. Right  ventricular systolic function is mildly  reduced. There is moderately elevated pulmonary artery systolic pressure. The tricuspid regurgitant velocity is 3.41 m/s, and with an assumed right atrial pressure of 10 mmHg, the estimated right ventricular systolic pressure is 56.5 mmHg.  Left Atrium: Left atrial size was mildly dilated.  Right Atrium: Right atrial size was moderately dilated.  Pericardium: There is no evidence of pericardial effusion.  Mitral Valve: The mitral valve is normal in structure. Mild mitral valve regurgitation, with centrally-directed jet.  Tricuspid Valve: The tricuspid valve is abnormal. Tricuspid valve regurgitation is severe.  Aortic Valve: The aortic valve is tricuspid. There is mild calcification of the aortic valve. There is mild thickening of the aortic valve. Aortic valve regurgitation is not visualized. Aortic valve sclerosis/calcification is present, without any evidence of aortic stenosis.  Pulmonic Valve: The pulmonic valve was grossly normal. Pulmonic valve regurgitation is trivial.  Aorta: The aortic root and ascending aorta are structurally normal, with no evidence of dilitation.  Venous: The inferior vena cava was not well visualized.  IAS/Shunts: The interatrial septum was not well visualized.  Additional Comments: A device lead is visualized in the right ventricle.   LEFT VENTRICLE PLAX 2D LVIDd:         4.30 cm LVIDs:         2.30 cm LV PW:         1.00 cm LV IVS:        1.30 cm LVOT diam:     2.20 cm LV SV:         58 LV SV Index:   32 LVOT Area:     3.80 cm   RIGHT VENTRICLE RV Basal diam:  3.90 cm TAPSE (M-mode): 1.5 cm RVSP:           49.5 mmHg  LEFT ATRIUM           Index        RIGHT ATRIUM           Index LA diam:      3.90 cm 2.17 cm/m   RA Pressure: 3.00 mmHg LA Vol (A4C): 44.1 ml 24.52 ml/m  RA Area:     23.10 cm RA Volume:   71.80 ml  39.92 ml/m AORTIC VALVE LVOT Vmax:   74.80 cm/s LVOT Vmean:  46.267 cm/s LVOT  VTI:    0.154 m  AORTA Ao Root diam: 3.40 cm Ao Asc diam:  3.10 cm  TRICUSPID VALVE TR Peak grad:   46.5 mmHg TR Vmax:        341.00 cm/s Estimated RAP:  3.00 mmHg RVSP:           49.5 mmHg  SHUNTS Systemic VTI:  0.15 m Systemic Diam: 2.20 cm  Rachelle Hora Croitoru MD Electronically signed by Thurmon Fair MD Signature Date/Time: 08/06/2022/5:35:30 PM    Final              Recent Labs: 08/06/2022: ALT 20; Hemoglobin 11.6; NT-Pro BNP 2,368; Platelets 231; TSH 2.710 09/03/2022: BUN 18; Creatinine, Ser 0.98; Potassium 4.7; Sodium 138  Recent Lipid Panel    Component Value Date/Time   CHOL 194 11/30/2021 0937   TRIG 122 11/30/2021 0937   HDL 59 11/30/2021 0937   CHOLHDL 3.3 11/30/2021 0937   CHOLHDL 2.5 06/02/2018 0950   VLDL 15 03/28/2017 0907   LDLCALC 113 (H) 11/30/2021 0937   LDLCALC 86 06/02/2018 0950     Physical Exam:    VS:  BP 118/68   Pulse 65   Ht 4'  10" (1.473 m)   Wt 185 lb (83.9 kg)   SpO2 96%   BMI 38.67 kg/m     Wt Readings from Last 3 Encounters:  12/03/22 185 lb (83.9 kg)  11/12/22 185 lb (83.9 kg)  10/13/22 186 lb (84.4 kg)    GEN: Elderly female morbid obesity CARDIAC: IRIR  notable systolic murmur, no rubs, gallops  RESPIRATORY:  Clear to auscultation without rales, wheezing or rhonchi  ABDOMEN: Soft, non-tender, non-distended MUSCULOSKELETAL:  No LE edema, small knot on her right leg (painless); No deformity  SKIN: Warm and dry NEUROLOGIC:  Alert and oriented x 3 PSYCHIATRIC:  Normal affect   ASSESSMENT:    1. Nonrheumatic tricuspid valve regurgitation   2. Coronary artery disease of native artery of native heart with stable angina pectoris   3. Pulmonary hypertension, unspecified   4. Pacemaker implanted 10/25/14- St Jude   5. Long term (current) use of anticoagulants   6. Controlled type 2 diabetes mellitus with diabetic nephropathy, without long-term current use of insulin   7. Permanent atrial fibrillation   8. Sick sinus  syndrome (HCC)     PLAN:    Chronic diastolic HFpEF and Severe TR Pulmonary hypertension (WHO II) CKD IIIa - chronic - NYHA class I, Stage C, euvolemic etiology from AFL suspected - Diuretic regimen: Torsemide 40 mg Q48 and extra if above 185 BLS - continue Farxiga  - continue aldactone 12. 5  - no ACE/ARB/ARNI due to prior angioedema - given the presence of her RV lead, future percutaneous TR interventions may be challenging - Echo in the Summer   Permanent AFL- CHADSVASC 7 SSS s/p St. Jude DC PPM Acquire Thrombophilia - coumadin - continue bisoprolol  HTN  Continue CCB for now  Mild unobstructive CAD - LDL above goal, after SDM with patient and daugther, increase to atorvastatin 80 and labs in three months    Medication Adjustments/Labs and Tests Ordered: Current medicines are reviewed at length with the patient today.  Concerns regarding medicines are outlined above.  Orders Placed This Encounter  Procedures   ALT   Lipid panel   ECHOCARDIOGRAM COMPLETE   Meds ordered this encounter  Medications   atorvastatin (LIPITOR) 80 MG tablet    Sig: Take 1 tablet (80 mg total) by mouth daily.    Dispense:  90 tablet    Refill:  3    Patient Instructions  Medication Instructions:  Your physician has recommended you make the following change in your medication:  INCREASE: atorvastatin (Lipitor) to 80 mg by mouth once daily  *If you need a refill on your cardiac medications before your next appointment, please call your pharmacy*   Lab Work: IN 3 MONTHS: FLP, ALT (nothing to eat or drink 12 hours before except water and plain black coffee)  If you have labs (blood work) drawn today and your tests are completely normal, you will receive your results only by: MyChart Message (if you have MyChart) OR A paper copy in the mail If you have any lab test that is abnormal or we need to change your treatment, we will call you to review the  results.   Testing/Procedures: JULY: Your physician has requested that you have an echocardiogram. Echocardiography is a painless test that uses sound waves to create images of your heart. It provides your doctor with information about the size and shape of your heart and how well your heart's chambers and valves are working. This procedure takes approximately one hour. There are  no restrictions for this procedure. Please do NOT wear cologne, perfume, aftershave, or lotions (deodorant is allowed). Please arrive 15 minutes prior to your appointment time.    Follow-Up: At [facility], you and your health needs are our priority.  As part of our continuing mission to provide you with exceptional heart care, we have created designated Provider Care Teams.  These Care Teams include your primary Cardiologist (physician) and Advanced Practice Providers (APPs -  Physician Assistants and Nurse Practitioners) who all work together to provide you with the care you need, when you need it.  We recommend signing up for the patient portal called "MyChart".  Sign up information is provided on this After Visit Summary.  MyChart is used to connect with patients for Virtual Visits (Telemedicine).  Patients are able to view lab/test results, encounter notes, upcoming appointments, etc.  Non-urgent messages can be sent to your provider as well.   To learn more about what you can do with MyChart, go to [email or web address].    Your next appointment:   6 month(s)  Provider:   Christell ConstantMahesh A Zelta Enfield, MD  or Jari Favreessa Conte, PA-C, Robin SearingErnest Dick, NP, or Jacolyn ReedyMichele Lenze, PA-C       Signed, Christell ConstantMahesh A Kobey Sides, MD  12/03/2022 8:33 AM    [facility]

## 2022-12-03 ENCOUNTER — Ambulatory Visit: Payer: Medicare Other | Attending: Internal Medicine | Admitting: Internal Medicine

## 2022-12-03 ENCOUNTER — Encounter: Payer: Self-pay | Admitting: Internal Medicine

## 2022-12-03 VITALS — BP 118/68 | HR 65 | Ht <= 58 in | Wt 185.0 lb

## 2022-12-03 DIAGNOSIS — Z950 Presence of cardiac pacemaker: Secondary | ICD-10-CM

## 2022-12-03 DIAGNOSIS — E1121 Type 2 diabetes mellitus with diabetic nephropathy: Secondary | ICD-10-CM

## 2022-12-03 DIAGNOSIS — I272 Pulmonary hypertension, unspecified: Secondary | ICD-10-CM | POA: Diagnosis not present

## 2022-12-03 DIAGNOSIS — I4821 Permanent atrial fibrillation: Secondary | ICD-10-CM

## 2022-12-03 DIAGNOSIS — I361 Nonrheumatic tricuspid (valve) insufficiency: Secondary | ICD-10-CM | POA: Insufficient documentation

## 2022-12-03 DIAGNOSIS — Z7901 Long term (current) use of anticoagulants: Secondary | ICD-10-CM

## 2022-12-03 DIAGNOSIS — I25118 Atherosclerotic heart disease of native coronary artery with other forms of angina pectoris: Secondary | ICD-10-CM

## 2022-12-03 DIAGNOSIS — I495 Sick sinus syndrome: Secondary | ICD-10-CM

## 2022-12-03 MED ORDER — ATORVASTATIN CALCIUM 80 MG PO TABS: 80.0000 mg | ORAL_TABLET | Freq: Every day | ORAL | 3 refills | Status: DC

## 2022-12-03 NOTE — Patient Instructions (Signed)
Medication Instructions:  Your physician has recommended you make the following change in your medication:  INCREASE: atorvastatin (Lipitor) to 80 mg by mouth once daily  *If you need a refill on your cardiac medications before your next appointment, please call your pharmacy*   Lab Work: IN 3 MONTHS: FLP, ALT (nothing to eat or drink 12 hours before except water and plain black coffee)  If you have labs (blood work) drawn today and your tests are completely normal, you will receive your results only by: MyChart Message (if you have MyChart) OR A paper copy in the mail If you have any lab test that is abnormal or we need to change your treatment, we will call you to review the results.   Testing/Procedures: JULY: Your physician has requested that you have an echocardiogram. Echocardiography is a painless test that uses sound waves to create images of your heart. It provides your doctor with information about the size and shape of your heart and how well your heart's chambers and valves are working. This procedure takes approximately one hour. There are no restrictions for this procedure. Please do NOT wear cologne, perfume, aftershave, or lotions (deodorant is allowed). Please arrive 15 minutes prior to your appointment time.    Follow-Up: At [facility], you and your health needs are our priority.  As part of our continuing mission to provide you with exceptional heart care, we have created designated Provider Care Teams.  These Care Teams include your primary Cardiologist (physician) and Advanced Practice Providers (APPs -  Physician Assistants and Nurse Practitioners) who all work together to provide you with the care you need, when you need it.  We recommend signing up for the patient portal called "MyChart".  Sign up information is provided on this After Visit Summary.  MyChart is used to connect with patients for Virtual Visits (Telemedicine).  Patients are able to view  lab/test results, encounter notes, upcoming appointments, etc.  Non-urgent messages can be sent to your provider as well.   To learn more about what you can do with MyChart, go to [email or web address].    Your next appointment:   6 month(s)  Provider:   Christell Constant, MD  or Jari Favre, PA-C, Robin Searing, NP, or Jacolyn Reedy, PA-C

## 2022-12-16 ENCOUNTER — Ambulatory Visit: Payer: Medicare Other | Admitting: Pharmacist

## 2022-12-21 ENCOUNTER — Ambulatory Visit: Payer: Medicare Other | Attending: Internal Medicine

## 2022-12-21 DIAGNOSIS — I480 Paroxysmal atrial fibrillation: Secondary | ICD-10-CM

## 2022-12-21 DIAGNOSIS — Z7901 Long term (current) use of anticoagulants: Secondary | ICD-10-CM

## 2022-12-21 LAB — POCT INR: INR: 2.6 (ref 2.0–3.0)

## 2022-12-21 NOTE — Patient Instructions (Signed)
Description   Continue same dosage 1 tablet daily except 1.5 tablets on Sundays and Thursdays. Continue eating 2 serving of greens per week plus your one day of brussels sprouts. Recheck INR in 6 weeks. Cou[facility] [phone] or [phone]

## 2022-12-28 DIAGNOSIS — H401131 Primary open-angle glaucoma, bilateral, mild stage: Secondary | ICD-10-CM | POA: Diagnosis not present

## 2022-12-29 NOTE — Progress Notes (Signed)
Remote pacemaker transmission.   

## 2022-12-30 ENCOUNTER — Other Ambulatory Visit: Payer: Self-pay | Admitting: Family

## 2022-12-30 DIAGNOSIS — I10 Essential (primary) hypertension: Secondary | ICD-10-CM

## 2023-01-05 DIAGNOSIS — K080 Exfoliation of teeth due to systemic causes: Secondary | ICD-10-CM | POA: Diagnosis not present

## 2023-01-11 ENCOUNTER — Ambulatory Visit: Payer: Medicare Other | Admitting: Neurology

## 2023-01-11 ENCOUNTER — Encounter: Payer: Self-pay | Admitting: Neurology

## 2023-01-11 DIAGNOSIS — M48062 Spinal stenosis, lumbar region with neurogenic claudication: Secondary | ICD-10-CM | POA: Diagnosis not present

## 2023-01-11 DIAGNOSIS — E1142 Type 2 diabetes mellitus with diabetic polyneuropathy: Secondary | ICD-10-CM | POA: Diagnosis not present

## 2023-01-11 MED ORDER — GABAPENTIN 300 MG PO CAPS: 300.0000 mg | ORAL_CAPSULE | Freq: Two times a day (BID) | ORAL | 3 refills | Status: AC

## 2023-01-11 NOTE — Progress Notes (Unsigned)
Follow-up Visit   Date: 01/11/2023    Tracey Todd MRN: [id number] DOB: 10-05-1934    Tracey Todd is a 88 y.o. left-handed female with PAF, sick sinus syndrome s/p PPM, CHF, diabetes mellitus, and lumbar spondylosis returning to the clinic for follow-up of bilateral leg pain.  The patient was accompanied to the clinic by daughter who also provides collateral information.    IMPRESSION/PLAN: Lumbar spondylosis, leg pain improved with PT and gabapentin - Continue gabapentin 300mg  twice daily.  Refills provided.   Diabetic neuropathy affecting the feet, stable  She may request future refills from PCP if symptoms are stable otherwise follow-up in 1 year  --------------------------------------------- History of present illness: For the past several years she has numbness involving the feet and lateral leg, which has become worse over the past 6 months.  It occurs about 2-3 times per week which lasts several hours.  With prolonged standing and walking, she gets burning sensation over the side of her hips which radiates down her leg and into the top of her feet. She was on gabapentin 100mg , but does not take this anymore. Several years ago, she was evaluated by Maryella Shivers and had ESI for lumbar radiculopathy.  She recalls pain was better controlled on gabapentin 300mg  at bedtime. CT myelogram from 2017 shows moderate central canal stenosis ta L4-5 and disc protrusion at L1-2.   UPDATE 01/12/2023:  She is here for 1 year follow-up.  At her last visit, I started gabapentin 300mg  which she takes twice daily which alleviates her leg burning sensation. She also completed PT but reports variable benefit.  Unfortunately, in 2022-11-15 her husband passed away; they would have been married 70 years in July.  She is understandably grieving.  She ambulates with a cane at home and walker only as needed. No interval falls or hospitalizations.    Medications:  Current Outpatient  Medications on File Prior to Visit  Medication Sig Dispense Refill   amLODipine (NORVASC) 5 MG tablet Take 1 tablet by mouth once daily 90 tablet 0   atorvastatin (LIPITOR) 80 MG tablet Take 1 tablet (80 mg total) by mouth daily. 90 tablet 3   bisoprolol (ZEBETA) 10 MG tablet Take 1 tablet by mouth once daily for blood pressure 90 tablet 0   Blood Glucose Monitoring Suppl (ONETOUCH VERIO) w/Device KIT Use to check fasting blood sugar daily. Dx: E11.59 1 kit 0   calcium carbonate (OS-CAL) 600 MG TABS tablet Take 600 mg by mouth daily with breakfast.     cetirizine (ZYRTEC) 10 MG chewable tablet as needed for allergies.     Cholecalciferol (VITAMIN D) 2000 UNITS CAPS Take 6,000 Units by mouth daily.      COSOPT 2-0.5 % ophthalmic solution 1 drop 2 (two) times daily.     dapagliflozin propanediol (FARXIGA) 10 MG TABS tablet Take 1 tablet (10 mg total) by mouth daily before breakfast. 90 tablet 3   gabapentin (NEURONTIN) 300 MG capsule Take 1 capsule (300 mg total) by mouth 2 (two) times daily. 180 capsule 0   glucose blood (ONETOUCH VERIO) test strip USE TO CHECK FASTING BLOOD SUGAR TWICE DAILY 100 each 11   insulin glargine (LANTUS SOLOSTAR) 100 UNIT/ML Solostar Pen Inject 8 Units into the skin at bedtime.     Lancets (ONETOUCH DELICA PLUS LANCET33G) MISC USE TO CHECK FASTING BLOOD SUGAR TWICE DAILY 100 each 2   latanoprost (XALATAN) 0.005 % ophthalmic solution Place into both eyes daily.  Lifitegrast (XIIDRA) 5 % SOLN Apply to eye daily at 6 (six) AM.     Magnesium 500 MG TABS Take 1,000 mg by mouth in the morning and at bedtime. 2 tabs am, 2 tab pm     spironolactone (ALDACTONE) 25 MG tablet Take 0.5 tablets (12.5 mg total) by mouth daily. 45 tablet 3   torsemide (DEMADEX) 20 MG tablet Take 2 tablets (40 mg total) by mouth every other day. 90 tablet 3   warfarin (COUMADIN) 5 MG tablet Take 1.5 to one tablet by mouth once daily as DIRECTED BY ANTICOAGULATION  CLINIC 40 tablet 3   No current  facility-administered medications on file prior to visit.    Allergies:  Allergies  Allergen Reactions   Penicillins Anaphylaxis   Quinapril     Angioedema    Feldene [Piroxicam] Other (See Comments)    Bleeding    Calcium-Containing Compounds Other (See Comments)    "calcium deposits on skin"   Celebrex [Celecoxib] Itching   Daypro [Oxaprozin] Itching    Vital Signs:  BP 108/61   Pulse 77   Ht 4\' 10"  (1.473 m)   Wt 185 lb (83.9 kg)   SpO2 91%   BMI 38.67 kg/m   Neurological Exam: MENTAL STATUS including orientation to time, place, person, recent and remote memory, attention span and concentration, language, and fund of knowledge is normal.  Speech is not dysarthric.  CRANIAL NERVES:   Pupils equal round and reactive to light.  Normal conjugate, extra-ocular eye movements in all directions of gaze.  No ptosis. Face is symmetric.  MOTOR:  Motor strength is 5/5 in all extremities.  No atrophy, fasciculations or abnormal movements.  No pronator drift.  Tone is normal.    MSRs:  Reflexes are 2+/4 throughout.  SENSORY:  Intact to vibration throughout, except reduced at the feet.  COORDINATION/GAIT:  Normal finger-to- nose-finger.  Intact rapid alternating movements bilaterally.  Gait mildly wide-based, stable, assisted with cane.  Data: CT lumbar spine wo contrast 12/18/2021: 1. 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 2. Mild spinal canal stenosis at L3-4 and L4-5.   Aortic Atherosclerosis (ICD10-I70.0).   Thank you for allowing me to participate in patient's care.  If I can answer any additional questions, I would be pleased to do so.    Sincerely,    Kemond Amorin K. Allena Katz, DO

## 2023-01-11 NOTE — Patient Instructions (Addendum)
Continue gabapentin 300mg  twice daily  You may ask for future refills from your primary care doctor or you are welcome to see me again in 1 year

## 2023-01-14 DIAGNOSIS — E1165 Type 2 diabetes mellitus with hyperglycemia: Secondary | ICD-10-CM | POA: Diagnosis not present

## 2023-01-14 DIAGNOSIS — E1141 Type 2 diabetes mellitus with diabetic mononeuropathy: Secondary | ICD-10-CM | POA: Diagnosis not present

## 2023-01-18 ENCOUNTER — Telehealth: Payer: Self-pay | Admitting: *Deleted

## 2023-01-18 NOTE — Telephone Encounter (Unsigned)
Copied from CRM [id number]. Topic: General - Inquiry >> Jan 17, 2023  8:41 AM Pincus Sanes wrote: Reason for CRM: Pt A1c was 9.6  3/15 from a Cardio visit and she has been contacted by an agency to get free services and wants her PCP to provide her a letter, told pt she would need an appt but she is wanting a fu call from Amy, labs were not from PCP but pt is intent that Amy or her nurse will be able to help her fu at [phone] she states she needs info asap.

## 2023-01-19 NOTE — Telephone Encounter (Signed)
Call patient with update. During office visit on 11/12/2022 patient reported that she is established with [facility] (Endocrinology) for diabetes management and aware to keep all scheduled appointments. If "free services" are in regards to diabetes management patient may want to consult with her established endocrinologist. Please schedule patient an appointment with me when best for her if she would like to discuss in more details. Thank you.

## 2023-01-20 ENCOUNTER — Other Ambulatory Visit: Payer: Self-pay | Admitting: Family

## 2023-01-20 DIAGNOSIS — I10 Essential (primary) hypertension: Secondary | ICD-10-CM

## 2023-01-20 NOTE — Telephone Encounter (Signed)
Patient has been called and verbalized understanding of provider msg

## 2023-01-29 DIAGNOSIS — E119 Type 2 diabetes mellitus without complications: Secondary | ICD-10-CM | POA: Diagnosis not present

## 2023-02-01 ENCOUNTER — Ambulatory Visit: Payer: Medicare Other | Attending: Cardiology | Admitting: *Deleted

## 2023-02-01 DIAGNOSIS — Z7901 Long term (current) use of anticoagulants: Secondary | ICD-10-CM

## 2023-02-01 DIAGNOSIS — I480 Paroxysmal atrial fibrillation: Secondary | ICD-10-CM

## 2023-02-01 DIAGNOSIS — Z5181 Encounter for therapeutic drug level monitoring: Secondary | ICD-10-CM | POA: Diagnosis not present

## 2023-02-01 LAB — POCT INR: POC INR: 3.4

## 2023-02-01 NOTE — Patient Instructions (Signed)
Description   Hold warfarin today and then continue same dosage 1 tablet daily except 1.5 tablets on Sundays and Thursdays. Continue eating 2 serving of greens per week plus your one day of brussels sprouts. Recheck INR in 3 weeks. Coumadin Clinic [phone] or [phone]

## 2023-02-07 DIAGNOSIS — H401131 Primary open-angle glaucoma, bilateral, mild stage: Secondary | ICD-10-CM | POA: Diagnosis not present

## 2023-02-22 ENCOUNTER — Ambulatory Visit: Payer: Medicare Other | Attending: Cardiology

## 2023-02-22 DIAGNOSIS — Z7901 Long term (current) use of anticoagulants: Secondary | ICD-10-CM

## 2023-02-22 DIAGNOSIS — I480 Paroxysmal atrial fibrillation: Secondary | ICD-10-CM

## 2023-02-22 LAB — POCT INR: INR: 3.6 — AB (ref 2.0–3.0)

## 2023-02-22 NOTE — Patient Instructions (Signed)
Hold warfarin today and then DECREASE TO 1 TABLET DAILY, EXCEPT 1.5 TABLETS ON THURSDAY.  Continue eating 2 serving of greens per week plus your one day of brussels sprouts. Recheck INR in 3 weeks. Coumadin Clinic [phone] or [phone]

## 2023-02-24 ENCOUNTER — Ambulatory Visit (INDEPENDENT_AMBULATORY_CARE_PROVIDER_SITE_OTHER): Payer: Medicare Other

## 2023-02-24 DIAGNOSIS — I495 Sick sinus syndrome: Secondary | ICD-10-CM | POA: Diagnosis not present

## 2023-02-24 LAB — CUP PACEART REMOTE DEVICE CHECK
Battery Remaining Longevity: 20 mo
Battery Remaining Percentage: 17 %
Battery Voltage: 2.9 V
Brady Statistic AP VP Percent: 99 %
Brady Statistic AP VS Percent: 1 %
Brady Statistic AS VP Percent: 1 %
Brady Statistic AS VS Percent: 1 %
Brady Statistic RA Percent Paced: 25 %
Brady Statistic RV Percent Paced: 99 %
Date Time Interrogation Session: 20240627020248
Implantable Lead Connection Status: 753985
Implantable Lead Connection Status: 753985
Implantable Lead Implant Date: 20160226
Implantable Lead Implant Date: 20160226
Implantable Lead Location: 753859
Implantable Lead Location: 753860
Implantable Lead Model: 1948
Implantable Pulse Generator Implant Date: 20160226
Lead Channel Impedance Value: 440 Ohm
Lead Channel Impedance Value: 560 Ohm
Lead Channel Pacing Threshold Amplitude: 0.625 V
Lead Channel Pacing Threshold Amplitude: 0.75 V
Lead Channel Pacing Threshold Pulse Width: 0.4 ms
Lead Channel Pacing Threshold Pulse Width: 0.4 ms
Lead Channel Sensing Intrinsic Amplitude: 1.2 mV
Lead Channel Sensing Intrinsic Amplitude: 9.7 mV
Lead Channel Setting Pacing Amplitude: 1.625
Lead Channel Setting Pacing Amplitude: 2.5 V
Lead Channel Setting Pacing Pulse Width: 0.4 ms
Lead Channel Setting Sensing Sensitivity: 2 mV
Pulse Gen Model: 2240
Pulse Gen Serial Number: 7700661

## 2023-02-25 ENCOUNTER — Ambulatory Visit (INDEPENDENT_AMBULATORY_CARE_PROVIDER_SITE_OTHER): Payer: Medicare Other | Admitting: Podiatry

## 2023-02-25 ENCOUNTER — Encounter: Payer: Self-pay | Admitting: Podiatry

## 2023-02-25 DIAGNOSIS — B351 Tinea unguium: Secondary | ICD-10-CM | POA: Diagnosis not present

## 2023-02-25 DIAGNOSIS — M79676 Pain in unspecified toe(s): Secondary | ICD-10-CM | POA: Diagnosis not present

## 2023-02-25 DIAGNOSIS — E1142 Type 2 diabetes mellitus with diabetic polyneuropathy: Secondary | ICD-10-CM | POA: Diagnosis not present

## 2023-02-25 NOTE — Progress Notes (Signed)
This patient returns to my office for at risk foot care.  This patient requires this care by a professional since this patient will be at risk due to having diabetes and coagulation defect.  Patient is taking coumadin.  This patient is unable to cut nails herself since the patient cannot reach her nails.These nails are painful walking and wearing shoes.  This patient presents for at risk foot care today.  General Appearance  Alert, conversant and in no acute stress.  Vascular  Dorsalis pedis and posterior tibial  pulses are weakly  palpable  bilaterally.  Capillary return is within normal limits  bilaterally. Temperature is within normal limits  Bilaterally. Absent hair.  Neurologic  Senn-Weinstein monofilament wire test within normal limits  bilaterally. Muscle power within normal limits bilaterally.  Nails Thick disfigured discolored nails with subungual debris  from hallux to fifth toes bilaterally. No evidence of bacterial infection or drainage bilaterally.  Orthopedic  No limitations of motion  feet .  No crepitus or effusions noted.  No bony pathology or digital deformities noted.  Skin  normotropic skin with no porokeratosis noted bilaterally.  No signs of infections or ulcers noted.     Onychomycosis  Pain in right toes  Pain in left toes  Consent was obtained for treatment procedures.   Mechanical debridement of nails 1-5  bilaterally performed with a nail nipper. No dremel usage.  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   Return office visit   3 months                   Told patient to return for periodic foot care and evaluation due to potential at risk complications.   Crystalle Popwell DPM  

## 2023-02-28 DIAGNOSIS — E119 Type 2 diabetes mellitus without complications: Secondary | ICD-10-CM | POA: Diagnosis not present

## 2023-03-10 NOTE — Progress Notes (Signed)
Remote pacemaker transmission.   

## 2023-03-11 ENCOUNTER — Ambulatory Visit (HOSPITAL_COMMUNITY): Payer: Medicare Other | Attending: Cardiology

## 2023-03-11 ENCOUNTER — Ambulatory Visit: Payer: Medicare Other

## 2023-03-11 DIAGNOSIS — I25118 Atherosclerotic heart disease of native coronary artery with other forms of angina pectoris: Secondary | ICD-10-CM | POA: Diagnosis not present

## 2023-03-11 DIAGNOSIS — I272 Pulmonary hypertension, unspecified: Secondary | ICD-10-CM | POA: Diagnosis not present

## 2023-03-11 DIAGNOSIS — I4891 Unspecified atrial fibrillation: Secondary | ICD-10-CM | POA: Insufficient documentation

## 2023-03-11 DIAGNOSIS — I361 Nonrheumatic tricuspid (valve) insufficiency: Secondary | ICD-10-CM | POA: Insufficient documentation

## 2023-03-11 LAB — ECHOCARDIOGRAM COMPLETE
Area-P 1/2: 2.94 cm2
S' Lateral: 2.7 cm

## 2023-03-11 LAB — LIPID PANEL
Chol/HDL Ratio: 2.5 ratio (ref 0.0–4.4)
Cholesterol, Total: 142 mg/dL (ref 100–199)
HDL: 57 mg/dL (ref >39)
LDL Chol Calc (NIH): 68 mg/dL (ref 0–99)
Triglycerides: 90 mg/dL (ref 0–149)
VLDL Cholesterol Cal: 17 mg/dL (ref 5–40)

## 2023-03-11 LAB — ALT: ALT: 11 IU/L (ref 0–32)

## 2023-03-14 DIAGNOSIS — H401131 Primary open-angle glaucoma, bilateral, mild stage: Secondary | ICD-10-CM | POA: Diagnosis not present

## 2023-03-15 ENCOUNTER — Ambulatory Visit: Payer: Medicare Other | Attending: Cardiology | Admitting: *Deleted

## 2023-03-15 DIAGNOSIS — Z7901 Long term (current) use of anticoagulants: Secondary | ICD-10-CM | POA: Diagnosis not present

## 2023-03-15 DIAGNOSIS — I480 Paroxysmal atrial fibrillation: Secondary | ICD-10-CM

## 2023-03-15 LAB — POCT INR: INR: 3.1 — AB (ref 2.0–3.0)

## 2023-03-15 NOTE — Patient Instructions (Signed)
Description   Today take 1/2 tablet of warfarin then continue taking 1 TABLET DAILY, EXCEPT 1.5 TABLETS ON THURSDAY.  Continue eating 2 serving of greens per week plus your one day of brussels sprouts. Recheck INR in 3 weeks. Coumadin Clinic [phone] or [phone]

## 2023-03-30 ENCOUNTER — Other Ambulatory Visit: Payer: Self-pay | Admitting: Internal Medicine

## 2023-03-30 ENCOUNTER — Other Ambulatory Visit: Payer: Self-pay | Admitting: Family

## 2023-03-30 DIAGNOSIS — I480 Paroxysmal atrial fibrillation: Secondary | ICD-10-CM

## 2023-03-30 DIAGNOSIS — Z7901 Long term (current) use of anticoagulants: Secondary | ICD-10-CM

## 2023-03-30 DIAGNOSIS — I10 Essential (primary) hypertension: Secondary | ICD-10-CM

## 2023-03-30 NOTE — Telephone Encounter (Signed)
Refill sent in 03/30/2023 @ 8:51am

## 2023-03-30 NOTE — Telephone Encounter (Signed)
Warfarin 5mg  refill Afib Last INR 03/15/23 Last OV 12/03/22

## 2023-03-31 DIAGNOSIS — E119 Type 2 diabetes mellitus without complications: Secondary | ICD-10-CM | POA: Diagnosis not present

## 2023-04-05 ENCOUNTER — Ambulatory Visit: Payer: Medicare Other | Attending: Internal Medicine | Admitting: *Deleted

## 2023-04-05 DIAGNOSIS — I480 Paroxysmal atrial fibrillation: Secondary | ICD-10-CM

## 2023-04-05 DIAGNOSIS — Z7901 Long term (current) use of anticoagulants: Secondary | ICD-10-CM

## 2023-04-05 LAB — POCT INR: INR: 2.9 (ref 2.0–3.0)

## 2023-04-05 NOTE — Patient Instructions (Signed)
Description   Continue taking 1 TABLET DAILY, EXCEPT 1.5 TABLETS ON THURSDAY.  Continue eating 2 serving of greens per week plus your one day of brussels sprouts. Recheck INR in 4 weeks. Coumadin Clinic [phone] or [phone]

## 2023-04-12 ENCOUNTER — Telehealth: Payer: Self-pay | Admitting: Internal Medicine

## 2023-04-12 NOTE — Telephone Encounter (Signed)
Patient stated she spoke with [employer] and they told her to have her provider's office complete the application as she does not have a computer.

## 2023-04-12 NOTE — Telephone Encounter (Signed)
Called pt provided with information for [place] pt assistance.  Advised to call to determine if qualifies for assistance.  Pt expresses will call back after speaks with [employer].

## 2023-04-12 NOTE — Telephone Encounter (Signed)
Pt c/o medication issue:  1. Name of Medication:  Farxiga  2. How are you currently taking this medication (dosage and times per day)?   3. Are you having a reaction (difficulty breathing--STAT)?   4. What is your medication issue?   Patient states the price of a monthly supply of Marcelline Deist has gone up to $100+ dollars and she would like to discuss alternate options.

## 2023-04-13 NOTE — Telephone Encounter (Signed)
Obtained MD signature placed in outgoing mail to pt address on file.

## 2023-04-13 NOTE — Telephone Encounter (Addendum)
**Note De-Identified  Obfuscation** The pt states that she is wheelchair bound and does not drive so she cannot come to the office to pick up a [place]st application for [place] assistance.  I offered to print a [place] pt assistance application, complete the providers page, have Dr Izora Ribas sign and date it and then mail the entire application to her home address so she can complete her part of the application, obtain required documents per [place] and [place], and to then mail all to [facility] and Me at the address written on the instruction page of her [place] application.  She is in agreement with this plan and thanked me for our assistance.  She is aware to call us back if she has any questions or concerns.

## 2023-04-14 MED ORDER — DAPAGLIFLOZIN PROPANEDIOL 10 MG PO TABS: 10.0000 mg | ORAL_TABLET | Freq: Every day | ORAL | 0 refills | Status: DC

## 2023-04-14 NOTE — Telephone Encounter (Signed)
Called pt advised will leave 2 weeks of samples Farxiga 10 mg at the front desk for pick up.  Pt currently working with pt Development worker, international aid for assistance.

## 2023-04-14 NOTE — Telephone Encounter (Signed)
Patient is calling stating that she is now out of the medication farxiga and is wanting to know if she can have samples until she is able to obtain more. Please advise.

## 2023-04-15 DIAGNOSIS — E1165 Type 2 diabetes mellitus with hyperglycemia: Secondary | ICD-10-CM | POA: Diagnosis not present

## 2023-04-27 ENCOUNTER — Telehealth: Payer: Self-pay | Admitting: Internal Medicine

## 2023-04-27 MED ORDER — DAPAGLIFLOZIN PROPANEDIOL 10 MG PO TABS: 10.0000 mg | ORAL_TABLET | Freq: Every day | ORAL | 3 refills | Status: DC

## 2023-04-27 MED ORDER — DAPAGLIFLOZIN PROPANEDIOL 10 MG PO TABS: 10.0000 mg | ORAL_TABLET | Freq: Every day | ORAL | 0 refills | Status: DC

## 2023-04-27 NOTE — Telephone Encounter (Signed)
Leaving pt 2 weeks of samples of Faxiga 10 mg tablets and a patient assistance application at the front desk [employer]. FYI

## 2023-04-27 NOTE — Telephone Encounter (Signed)
Called pt reports mailed pt assistance application last week Tuesday.  Would like more samples.  Advised will send message to pt assistance coordinator to see if she has any updates and for samples.

## 2023-04-27 NOTE — Telephone Encounter (Signed)
Patient calling the office for samples of medication:   1.  What medication and dosage are you requesting samples for? dapagliflozin propanediol (FARXIGA) 10 MG TABS tablet   2.  Are you currently out of this medication? Yes

## 2023-04-27 NOTE — Telephone Encounter (Addendum)
**Note De-Identified Atisha Hamidi Obfuscation** I called [place] and [place] and was advised by Tan that mailing applications to them is the least preferred way for them to receive them as there are too many things that can go wrong with mailing them in like: Pts mailing applications to the wrong address if they have an old application. 2.  The RX that is included with the application cannot be used as they only accept RXs that are faxed or e-scribed to them from the prescribing physicians office.   Per Tan they will need a [place] RX e-scribed to them which I have done so they will have on file.  I advised Tan that we provided the pt with a new [place] and [place] application with the date of 12/07/22 printed at the bottom of the first page and that the pt mailed it to them at the address included on that application. I also explained that the pt is 88 years old and does not drive so mailing her the application with the providers page already completed and signed by the MD so she could mail her entire application to them seemed the easiest way for he pt.  Tan states that since the pt mailed her application in that it may not have arrived to them yet as he does not see it in their system and to call them back later to check to see if they got it or not.  I called the pt and asked her if she can fill out another [place] and [place] application while at the office picking up her Farxiga samples and she stated that she cannot as her daughter will be picking them up for her but she offered that if we leave her another application up front with her samples that her daughter will pick all up and that she will complete the application and get it back to the office by Friday.  She is advised that we are leaving her 2 more weeks of Farxiga samples and a [place] Pt Asst application in the front office for her daughter to pick up on her behalf.  The pt verbalized understanding to all information given and is in agreement with this plan.  She thanked me for calling her back and for our  assistance.

## 2023-04-29 ENCOUNTER — Telehealth: Payer: Self-pay

## 2023-04-29 NOTE — Telephone Encounter (Signed)
An application for free [employer] medicines has been dropped off at the front desk for you.  I have placed the application in your hot pink folder next to the copy machine.  Thank you.

## 2023-04-29 NOTE — Telephone Encounter (Signed)
Pt's patient application was scanned to [facility], [employer]. FYI

## 2023-05-01 DIAGNOSIS — E119 Type 2 diabetes mellitus without complications: Secondary | ICD-10-CM | POA: Diagnosis not present

## 2023-05-03 ENCOUNTER — Ambulatory Visit: Payer: Medicare Other | Attending: Cardiology | Admitting: *Deleted

## 2023-05-03 DIAGNOSIS — I480 Paroxysmal atrial fibrillation: Secondary | ICD-10-CM | POA: Diagnosis not present

## 2023-05-03 DIAGNOSIS — Z7901 Long term (current) use of anticoagulants: Secondary | ICD-10-CM | POA: Diagnosis not present

## 2023-05-03 LAB — POCT INR: INR: 2.6 (ref 2.0–3.0)

## 2023-05-03 NOTE — Patient Instructions (Signed)
Description   Continue taking 1 TABLET DAILY, EXCEPT 1.5 TABLETS ON THURSDAY.  Continue eating 2 serving of greens per week plus your one day of brussels sprouts. Recheck INR in 5 weeks. Coumadin Clinic [phone] or [phone]

## 2023-05-05 ENCOUNTER — Other Ambulatory Visit: Payer: Self-pay | Admitting: Family

## 2023-05-05 ENCOUNTER — Other Ambulatory Visit: Payer: Self-pay

## 2023-05-05 DIAGNOSIS — I10 Essential (primary) hypertension: Secondary | ICD-10-CM

## 2023-05-05 MED ORDER — AMLODIPINE BESYLATE 5 MG PO TABS
5.0000 mg | ORAL_TABLET | Freq: Every day | ORAL | 0 refills | Status: DC
Start: 2023-05-05 — End: 2023-05-25

## 2023-05-05 NOTE — Telephone Encounter (Signed)
**Note De-Identified Ingris Pasquarella Obfuscation** I have completed the providers page of the pts [place] and [place] pt asst application for Farxiga assistance and have e-mailed all to Dr Debby Bud nurse so she can obtain his signature, date it, and to then fax all to [facility] and Me at the fax number written on the cover letter included.

## 2023-05-05 NOTE — Telephone Encounter (Signed)
Pt assistance application signed by provider faxed to [facility] & Me received confirmation of Fax.  To:               Recipient at [phone] Subject:          Pt assistance Result:           The transmission was successful. Explanation:      [employer] Ok

## 2023-05-12 MED ORDER — DAPAGLIFLOZIN PROPANEDIOL 10 MG PO TABS: 10.0000 mg | ORAL_TABLET | Freq: Every day | ORAL | 0 refills | Status: DC

## 2023-05-12 NOTE — Addendum Note (Signed)
Addended by: Macie Burows on: 05/12/2023 02:06 PM   Modules accepted: Orders

## 2023-05-12 NOTE — Telephone Encounter (Signed)
Called spoke with [facility].  Request pt get samples to last until Jan when will be out of donut hole.  Advised our office will not be able to give samples to last until Jan.  Advised pt should f/u pt assistance application.  If pt can not afford medication we will change med we do not give out months worth of samples.   Called [employer] was advised pt approved for [place] assistance.  05/05/23-08/29/2024.  Medication was mailed out on 05/10/23 with 10 days shipping time.  Called pt to advise of this information however phone continued to ring with no voicemail pick up.  Unable to leave a message.

## 2023-05-12 NOTE — Telephone Encounter (Signed)
Patient called stating [employer] notified her that they will be sending her Marcelline Deist medication which should arrive in 10-14 day.  Patient called to follow-up on getting samples of this medication.

## 2023-05-12 NOTE — Telephone Encounter (Signed)
Called pt advised 7 days of Farxiga 10 mg samples will be placed at the front desk for pick up.  Also reviewed information from pt assistance.  Pt had no further questions or concerns.

## 2023-05-12 NOTE — Telephone Encounter (Signed)
Ok, waiting for you to come get it. Thanks

## 2023-05-12 NOTE — Telephone Encounter (Signed)
Tracey Todd from [place] is requesting a callback at [phone] from [place] regarding assistance form for medication. Please advise

## 2023-05-12 NOTE — Telephone Encounter (Signed)
Pt c/o medication issue:  1. Name of Medication:   dapagliflozin propanediol (FARXIGA) 10 MG TABS tablet    2. How are you currently taking this medication (dosage and times per day)? Take 1 tablet (10 mg total) by mouth daily before breakfast.   3. Are you having a reaction (difficulty breathing--STAT)? No  4. What is your medication issue?  [name] from [place] is calling because the patient takes her last dosage of this medication today. [name] is requesting for [place] to provide samples of the medication till the end of the year. Please advise.

## 2023-05-16 ENCOUNTER — Encounter: Payer: Self-pay | Admitting: Podiatry

## 2023-05-16 ENCOUNTER — Ambulatory Visit (INDEPENDENT_AMBULATORY_CARE_PROVIDER_SITE_OTHER): Payer: Medicare Other | Admitting: Podiatry

## 2023-05-16 DIAGNOSIS — M79676 Pain in unspecified toe(s): Secondary | ICD-10-CM | POA: Diagnosis not present

## 2023-05-16 DIAGNOSIS — E1142 Type 2 diabetes mellitus with diabetic polyneuropathy: Secondary | ICD-10-CM

## 2023-05-16 DIAGNOSIS — B351 Tinea unguium: Secondary | ICD-10-CM | POA: Diagnosis not present

## 2023-05-16 NOTE — Progress Notes (Signed)
This patient returns to my office for at risk foot care.  This patient requires this care by a professional since this patient will be at risk due to having diabetes and coagulation defect.  Patient is taking coumadin.  This patient is unable to cut nails herself since the patient cannot reach her nails.These nails are painful walking and wearing shoes.  This patient presents for at risk foot care today.  General Appearance  Alert, conversant and in no acute stress.  Vascular  Dorsalis pedis and posterior tibial  pulses are weakly  palpable  bilaterally.  Capillary return is within normal limits  bilaterally. Temperature is within normal limits  Bilaterally. Absent hair.  Neurologic  Senn-Weinstein monofilament wire test within normal limits  bilaterally. Muscle power within normal limits bilaterally.  Nails Thick disfigured discolored nails with subungual debris  from hallux to fifth toes bilaterally. No evidence of bacterial infection or drainage bilaterally.  Orthopedic  No limitations of motion  feet .  No crepitus or effusions noted.  No bony pathology or digital deformities noted.  Skin  normotropic skin with no porokeratosis noted bilaterally.  No signs of infections or ulcers noted.     Onychomycosis  Pain in right toes  Pain in left toes  Consent was obtained for treatment procedures.   Mechanical debridement of nails 1-5  bilaterally performed with a nail nipper. No dremel usage.   Return office visit   3 months                   Told patient to return for periodic foot care and evaluation due to potential at risk complications.   Helane Gunther DPM

## 2023-05-17 NOTE — Telephone Encounter (Signed)
Please refer to 05/12/23 encounter for more information.

## 2023-05-25 ENCOUNTER — Ambulatory Visit (INDEPENDENT_AMBULATORY_CARE_PROVIDER_SITE_OTHER): Payer: Medicare Other | Admitting: Family

## 2023-05-25 ENCOUNTER — Encounter: Payer: Self-pay | Admitting: Family

## 2023-05-25 VITALS — BP 105/65 | HR 63 | Temp 98.5°F | Ht <= 58 in | Wt 185.8 lb

## 2023-05-25 DIAGNOSIS — H9191 Unspecified hearing loss, right ear: Secondary | ICD-10-CM | POA: Diagnosis not present

## 2023-05-25 DIAGNOSIS — R21 Rash and other nonspecific skin eruption: Secondary | ICD-10-CM

## 2023-05-25 DIAGNOSIS — H6121 Impacted cerumen, right ear: Secondary | ICD-10-CM

## 2023-05-25 MED ORDER — TRIAMCINOLONE ACETONIDE 0.025 % EX CREA: 1.0000 | TOPICAL_CREAM | Freq: Two times a day (BID) | CUTANEOUS | 0 refills | Status: DC

## 2023-05-25 NOTE — Progress Notes (Signed)
Patient ID: Tracey Todd, female    DOB: August 10, 1935  MRN: [id number]  CC: Follow-Up  Subjective: Tracey Todd is a 88 y.o. female who presents for follow-up.  Her concerns today include:  - Right ear decreased hearing for several months. States she does have "wax buildup" in right ear frequently. She denies associated red flag symptoms.  - Rash of right forearm persisting for months. She denies symptoms. - No further issues/concerns for discussion today.   Patient Active Problem List   Diagnosis Date Noted   Nonrheumatic tricuspid valve regurgitation 12/03/2022   Coronary artery disease of native artery of native heart with stable angina pectoris (HCC) 12/03/2022   Atrial fibrillation (HCC) 08/11/2022   Hav (hallux abducto valgus), unspecified laterality 02/19/2022   Right ear impacted cerumen 11/06/2021   Chronic arthropathy 10/12/2019   Callus 10/12/2019   Long term (current) use of anticoagulants 10/02/2018   Atherosclerosis of aorta (HCC) 07/20/2017   Diabetic neuropathy (HCC) 07/20/2017   Lumbar radiculopathy 03/30/2017   Pain of both hip joints 03/30/2017   Pulmonary hypertension, unspecified (HCC) 11/20/2015   BMI 34.0-34.9,adult 08/06/2015   Diabetes mellitus type 2, controlled (HCC) 04/25/2015   Sick sinus syndrome (HCC) 01/29/2015   Pacemaker implanted 10/25/14- [employer] Jude 10/26/2014   Essential hypertension 10/22/2014   PAF-fib and flutter 10/22/2014   Congestive heart failure (HCC) 10/21/2014   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 90 tablet 3   bisoprolol (ZEBETA) 10 MG tablet Take 1 tablet by mouth once daily for blood pressure 90 tablet 0   Blood Glucose Monitoring Suppl (ONETOUCH VERIO) w/Device KIT Use to check fasting blood sugar daily. Dx: E11.59 1 kit 0   calcium carbonate (OS-CAL) 600 MG TABS tablet Take 600 mg by mouth daily with breakfast.     cetirizine (ZYRTEC) 10 MG chewable tablet as needed for allergies.     Cholecalciferol (VITAMIN D) 2000 UNITS CAPS Take 6,000 Units by mouth daily.      COSOPT 2-0.5 % ophthalmic solution 1 drop 2 (two) times daily.     dapagliflozin propanediol (FARXIGA) 10 MG TABS tablet Take 1 tablet (10 mg total) by mouth daily before breakfast. 90 tablet 3   dapagliflozin propanediol (FARXIGA) 10 MG TABS tablet Take 1 tablet (10 mg total) by mouth daily before breakfast. 14 tablet 0   gabapentin (NEURONTIN) 300 MG capsule Take 1 capsule (300 mg total) by mouth 2 (two) times daily. 180 capsule 3   glucose blood (ONETOUCH VERIO) test strip USE TO CHECK FASTING BLOOD SUGAR TWICE DAILY 100 each 11   insulin glargine (LANTUS SOLOSTAR) 100 UNIT/ML Solostar Pen Inject 8 Units into the skin at bedtime.     Lancets (ONETOUCH DELICA PLUS LANCET33G) MISC USE TO CHECK FASTING BLOOD SUGAR TWICE DAILY 100 each 2   latanoprost (XALATAN) 0.005 % ophthalmic solution Place into both eyes daily.     Lifitegrast (XIIDRA) 5 % SOLN Apply to eye daily at  6 (six) AM.     Magnesium 500 MG TABS Take 1,000 mg by mouth in the morning and at bedtime. 2 tabs am, 2 tab pm     spironolactone (ALDACTONE) 25 MG tablet Take 0.5 tablets (12.5 mg total) by mouth daily. 45 tablet 3   torsemide (DEMADEX) 20 MG tablet Take 2 tablets (40 mg total) by mouth every other day. 90 tablet 3   warfarin (COUMADIN) 5 MG tablet TAKE 1 & 1/2 (ONE & ONE-HALF) TABLETS BY MOUTH ONCE DAILY AS DIRECTED BY  ANTICOAGULATION  CLINIC 40 tablet 3   No current facility-administered medications on file  prior to visit.    Allergies  Allergen Reactions   Penicillin G Anaphylaxis   Penicillins Anaphylaxis   Quinapril Anaphylaxis    Angioedema   Other     Other Reaction(s): irritation of skin   Piroxicam Other (See Comments)    Bleeding  Other Reaction(s): blood in stool   Calcium-Containing Compounds Other (See Comments)    "calcium deposits on skin"   Celecoxib Itching   Oxaprozin Itching and Other (See Comments)    Social History   Socioeconomic History   Marital status: Married    Spouse name: Not on file   Number of children: Not on file   Years of education: Not on file   Highest education level: Not on file  Occupational History   Not on file  Tobacco Use   Smoking status: Former    Current packs/day: 0.00    Types: Cigarettes    Quit date: 12/06/1963    Years since quitting: 59.5    Passive exposure: Never   Smokeless tobacco: Never  Vaping Use   Vaping status: Never Used  Substance and Sexual Activity   Alcohol use: No   Drug use: Never   Sexual activity: Not Currently  Other Topics Concern   Not on file  Social History Narrative   Left Handed    Lives in a one story home       Loss her husband 10/03/22   Social Determinants of Health   Financial Resource Strain: Low Risk  (05/28/2021)   Overall Financial Resource Strain (CARDIA)    Difficulty of Paying Living Expenses: Not very hard  Food Insecurity: No Food Insecurity (05/28/2021)   Hunger Vital Sign    Worried About Running Out of Food in the Last Year: Never true    Ran Out of Food in the Last Year: Never true  Transportation Needs: No Transportation Needs (05/28/2021)   PRAPARE - Administrator, Civil Service (Medical): No    Lack of Transportation (Non-Medical): No  Physical Activity: Inactive (05/28/2021)   Exercise Vital Sign    Days of Exercise per Week: 0 days    Minutes of Exercise per Session: 0 min  Stress: No Stress Concern Present (05/28/2021)   [employer] of  Occupational Health - Occupational Stress Questionnaire    Feeling of Stress : Not at all  Social Connections: Moderately Integrated (05/28/2021)   Social Connection and Isolation Panel [NHANES]    Frequency of Communication with Friends and Family: More than three times a week    Frequency of Social Gatherings with Friends and Family: More than three times a week    Attends Religious Services: More than 4 times per year    Active Member of [employer] or Organizations: No    Attends Banker Meetings: Never    Marital Status: Married  Catering manager Violence:  Not At Risk (05/28/2021)   Humiliation, Afraid, Rape, and Kick questionnaire    Fear of Current or Ex-Partner: No    Emotionally Abused: No    Physically Abused: No    Sexually Abused: No    Family History  Problem Relation Age of Onset   Diabetes Mother    Cancer Mother    Cancer Father    Breast cancer Neg Hx     Past Surgical History:  Procedure Laterality Date   BREAST SURGERY     CATARACT EXTRACTION     LEFT AND RIGHT HEART CATHETERIZATION WITH CORONARY ANGIOGRAM N/A 10/23/2014   Procedure: LEFT AND RIGHT HEART CATHETERIZATION WITH CORONARY ANGIOGRAM;  Surgeon: Micheline Chapman, MD;  Location: [facility] CATH LAB;  Service: Cardiovascular;  Laterality: N/A;   PERMANENT PACEMAKER INSERTION N/A 10/25/2014   STJ dual chamber PPM implanted by Dr [name] for SSS   TONSILLECTOMY AND ADENOIDECTOMY     TOTAL KNEE ARTHROPLASTY      ROS: Review of Systems Negative except as stated above  PHYSICAL EXAM: BP 105/65   Pulse 63   Temp 98.5 F (36.9 C) (Oral)   Ht 4\' 10"  (1.473 m)   Wt 185 lb 12.8 oz (84.3 kg)   SpO2 90%   BMI 38.83 kg/m   Physical Exam HENT:     Head: Normocephalic and atraumatic.     Right Ear: There is impacted cerumen.     Left Ear: Tympanic membrane, ear canal and external ear normal.     Nose: Nose normal.     Mouth/Throat:     Mouth: Mucous membranes are moist.     Pharynx: Oropharynx is  clear.  Eyes:     Extraocular Movements: Extraocular movements intact.     Conjunctiva/sclera: Conjunctivae normal.     Pupils: Pupils are equal, round, and reactive to light.  Cardiovascular:     Rate and Rhythm: Normal rate and regular rhythm.     Pulses: Normal pulses.     Heart sounds: Normal heart sounds.  Pulmonary:     Effort: Pulmonary effort is normal.     Breath sounds: Normal breath sounds.  Musculoskeletal:        General: Normal range of motion.     Cervical back: Normal range of motion and neck supple.  Skin:    General: Skin is warm and dry.     Findings: Rash present.     Comments: Hyperpigmented rash right forearm, no drainage, no additional presentation.  Neurological:     General: No focal deficit present.     Mental Status: She is alert and oriented to person, place, and time.  Psychiatric:        Mood and Affect: Mood normal.        Behavior: Behavior normal.     ASSESSMENT AND PLAN: 1. Decreased hearing of right ear 2. Right ear impacted cerumen - Ear lavage completed by Curley Spice, CMA.  - Referral to ENT for evaluation/management. During the interim follow-up with primary provider as scheduled until established with referral. - Ambulatory referral to ENT  3. Rash and nonspecific skin eruption - Triamcinolone cream as prescribed. Counseled on medication adherence.  - Referral to Dermatology for evaluation/management. During the interim follow-up with primary provider as scheduled until established with referral. - Ambulatory referral to Dermatology - triamcinolone (KENALOG) 0.025 % cream; Apply 1 Application topically 2 (two) times daily.  Dispense: 60 g; Refill: 0   Patient was given the opportunity to ask  questions.  Patient verbalized understanding of the plan and was able to repeat key elements of the plan. Patient was given clear instructions to go to Emergency Department or return to medical center if symptoms don't improve, worsen, or new  problems develop.The patient verbalized understanding.   Orders Placed This Encounter  Procedures   Ambulatory referral to ENT   Ambulatory referral to Dermatology     Requested Prescriptions   Signed Prescriptions Disp Refills   triamcinolone (KENALOG) 0.025 % cream 60 g 0    Sig: Apply 1 Application topically 2 (two) times daily.    Follow-up with primary provider as scheduled.  Rema Fendt, NP

## 2023-05-25 NOTE — Progress Notes (Signed)
Patient asking for ears checked, states her right ear will stuff then pop.

## 2023-05-26 ENCOUNTER — Ambulatory Visit (INDEPENDENT_AMBULATORY_CARE_PROVIDER_SITE_OTHER): Payer: Medicare Other

## 2023-05-26 DIAGNOSIS — I495 Sick sinus syndrome: Secondary | ICD-10-CM

## 2023-05-26 LAB — CUP PACEART REMOTE DEVICE CHECK
Battery Remaining Longevity: 17 mo
Battery Remaining Percentage: 15 %
Battery Voltage: 2.87 V
Brady Statistic AP VP Percent: 99 %
Brady Statistic AP VS Percent: 1 %
Brady Statistic AS VP Percent: 1 %
Brady Statistic AS VS Percent: 1 %
Brady Statistic RA Percent Paced: 42 %
Brady Statistic RV Percent Paced: 99 %
Date Time Interrogation Session: 20240926020013
Implantable Lead Connection Status: 753985
Implantable Lead Connection Status: 753985
Implantable Lead Implant Date: 20160226
Implantable Lead Implant Date: 20160226
Implantable Lead Location: 753859
Implantable Lead Location: 753860
Implantable Lead Model: 1948
Implantable Pulse Generator Implant Date: 20160226
Lead Channel Impedance Value: 450 Ohm
Lead Channel Impedance Value: 580 Ohm
Lead Channel Pacing Threshold Amplitude: 0.75 V
Lead Channel Pacing Threshold Amplitude: 0.75 V
Lead Channel Pacing Threshold Pulse Width: 0.4 ms
Lead Channel Pacing Threshold Pulse Width: 0.4 ms
Lead Channel Sensing Intrinsic Amplitude: 1.9 mV
Lead Channel Sensing Intrinsic Amplitude: 10.4 mV
Lead Channel Setting Pacing Amplitude: 1.75 V
Lead Channel Setting Pacing Amplitude: 2.5 V
Lead Channel Setting Pacing Pulse Width: 0.4 ms
Lead Channel Setting Sensing Sensitivity: 2 mV
Pulse Gen Model: 2240
Pulse Gen Serial Number: 7700661

## 2023-05-31 DIAGNOSIS — E119 Type 2 diabetes mellitus without complications: Secondary | ICD-10-CM | POA: Diagnosis not present

## 2023-06-02 NOTE — Progress Notes (Signed)
Remote pacemaker transmission.   

## 2023-06-03 DIAGNOSIS — N1832 Chronic kidney disease, stage 3b: Secondary | ICD-10-CM | POA: Diagnosis not present

## 2023-06-03 DIAGNOSIS — E1165 Type 2 diabetes mellitus with hyperglycemia: Secondary | ICD-10-CM | POA: Diagnosis not present

## 2023-06-03 DIAGNOSIS — I272 Pulmonary hypertension, unspecified: Secondary | ICD-10-CM | POA: Diagnosis not present

## 2023-06-03 DIAGNOSIS — I10 Essential (primary) hypertension: Secondary | ICD-10-CM | POA: Diagnosis not present

## 2023-06-07 ENCOUNTER — Ambulatory Visit: Payer: Medicare Other | Attending: Cardiovascular Disease | Admitting: *Deleted

## 2023-06-07 DIAGNOSIS — I480 Paroxysmal atrial fibrillation: Secondary | ICD-10-CM

## 2023-06-07 DIAGNOSIS — Z7901 Long term (current) use of anticoagulants: Secondary | ICD-10-CM

## 2023-06-07 LAB — POCT INR: INR: 3.6 — AB (ref 2.0–3.0)

## 2023-06-07 NOTE — Patient Instructions (Signed)
Description   Do not take any warfarin today then continue taking 1 TABLET DAILY, EXCEPT 1.5 TABLETS ON THURSDAY.  Continue eating 2 serving of greens per week plus your one day of brussels sprouts. Recheck INR in 4 weeks. [facility] [phone] or [phone]

## 2023-06-16 ENCOUNTER — Other Ambulatory Visit (HOSPITAL_COMMUNITY): Payer: Self-pay | Admitting: *Deleted

## 2023-06-22 NOTE — Telephone Encounter (Signed)
Copied from CRM [id number]. Topic: Appointment Scheduling - Scheduling Inquiry for Clinic >> Jun 21, 2023  9:27 AM Marlow Baars wrote: Reason for CRM: The patient called in because she received a call stating it was time for her AWV and she is wanting ti schedule it on the 12th if possible in the afternoon. Please assist patient further  I spoke with the patient to inform her that the AWV is not due until on or after 08/07/23. She has scheduled an appointment for 08/15/23.

## 2023-07-01 DIAGNOSIS — E119 Type 2 diabetes mellitus without complications: Secondary | ICD-10-CM | POA: Diagnosis not present

## 2023-07-05 ENCOUNTER — Ambulatory Visit: Payer: Medicare Other | Attending: Cardiovascular Disease | Admitting: *Deleted

## 2023-07-05 DIAGNOSIS — Z7901 Long term (current) use of anticoagulants: Secondary | ICD-10-CM

## 2023-07-05 DIAGNOSIS — I480 Paroxysmal atrial fibrillation: Secondary | ICD-10-CM

## 2023-07-05 LAB — POCT INR: INR: 2.5 (ref 2.0–3.0)

## 2023-07-05 NOTE — Patient Instructions (Addendum)
Description   Continue taking warfarin 1 TABLET DAILY, EXCEPT 1.5 TABLETS ON THURSDAY.  Continue eating 2 serving of greens per week plus your one day of brussels sprouts. Recheck INR in 4 weeks. [facility] [phone] or [phone]

## 2023-07-07 ENCOUNTER — Other Ambulatory Visit: Payer: Self-pay | Admitting: Internal Medicine

## 2023-07-12 ENCOUNTER — Other Ambulatory Visit: Payer: Self-pay | Admitting: Family

## 2023-07-12 ENCOUNTER — Ambulatory Visit: Payer: Medicare Other | Attending: Internal Medicine | Admitting: Internal Medicine

## 2023-07-12 ENCOUNTER — Other Ambulatory Visit: Payer: Self-pay

## 2023-07-12 ENCOUNTER — Encounter: Payer: Self-pay | Admitting: Internal Medicine

## 2023-07-12 VITALS — BP 110/60 | HR 64 | Ht <= 58 in | Wt 187.0 lb

## 2023-07-12 DIAGNOSIS — I25118 Atherosclerotic heart disease of native coronary artery with other forms of angina pectoris: Secondary | ICD-10-CM

## 2023-07-12 DIAGNOSIS — I361 Nonrheumatic tricuspid (valve) insufficiency: Secondary | ICD-10-CM

## 2023-07-12 DIAGNOSIS — I5032 Chronic diastolic (congestive) heart failure: Secondary | ICD-10-CM

## 2023-07-12 DIAGNOSIS — I10 Essential (primary) hypertension: Secondary | ICD-10-CM

## 2023-07-12 DIAGNOSIS — I272 Pulmonary hypertension, unspecified: Secondary | ICD-10-CM

## 2023-07-12 DIAGNOSIS — I4821 Permanent atrial fibrillation: Secondary | ICD-10-CM | POA: Diagnosis not present

## 2023-07-12 MED ORDER — BISOPROLOL FUMARATE 10 MG PO TABS: 10.0000 mg | ORAL_TABLET | Freq: Every day | ORAL | 0 refills | Status: DC

## 2023-07-12 NOTE — Patient Instructions (Signed)
Medication Instructions:  Your physician recommends that you continue on your current medications as directed. Please refer to the Current Medication list given to you today.  *If you need a refill on your cardiac medications before your next appointment, please call your pharmacy*   Lab Work: NONE If you have labs (blood work) drawn today and your tests are completely normal, you will receive your results only by: MyChart Message (if you have MyChart) OR A paper copy in the mail If you have any lab test that is abnormal or we need to change your treatment, we will call you to review the results.   Testing/Procedures: July 2025- -       Your physician has requested that you have an echocardiogram. Echocardiography is a painless test that uses sound waves to create images of your heart. It provides your doctor with information about the size and shape of your heart and how well your heart's chambers and valves are working. This procedure takes approximately one hour. There are no restrictions for this procedure. Please do NOT wear cologne, perfume, aftershave, or lotions (deodorant is allowed). Please arrive 15 minutes prior to your appointment time.  Please note: We ask at that you not bring children with you during ultrasound (echo/ vascular) testing. Due to room size and safety concerns, children are not allowed in the ultrasound rooms during exams. Our front office staff cannot provide observation of children in our lobby area while testing is being conducted. An adult accompanying a patient to their appointment will only be allowed in the ultrasound room at the discretion of the ultrasound technician under special circumstances. We apologize for any inconvenience.    Follow-Up: At [facility], you and your health needs are our priority.  As part of our continuing mission to provide you with exceptional heart care, we have created designated Provider Care Teams.  These Care Teams  include your primary Cardiologist (physician) and Advanced Practice Providers (APPs -  Physician Assistants and Nurse Practitioners) who all work together to provide you with the care you need, when you need it.  We recommend signing up for the patient portal called "MyChart".  Sign up information is provided on this After Visit Summary.  MyChart is used to connect with patients for Virtual Visits (Telemedicine).  Patients are able to view lab/test results, encounter notes, upcoming appointments, etc.  Non-urgent messages can be sent to your provider as well.   To learn more about what you can do with MyChart, go to [email or web address].    Your next appointment:   1 year(s)  Provider:   Riley Lam, MD

## 2023-07-12 NOTE — Progress Notes (Signed)
Cardiology Office Note:    Date:  07/12/2023   ID:  [name], DOB 1935/06/05, MRN [id number]  PCP:  Tracey Fendt, NP   [facility] Providers Cardiologist:  Christell Constant, MD Cardiology APP:  Marily Lente, NP (Inactive)  Electrophysiologist:  Will Jorja Loa, MD     Referring MD: Tracey Fendt, NP   CC: Tricuspid regurgitation follow up  History of Present Illness:    Tracey Todd is a 88 y.o. female with a hx of long standing persistent  AFL and HFpEF, HTN and HLD.  Mild CAD.   2023: Found to have severe TR; started higher dose lasix, and MRA, and SGLT2i.  Found to have severe TR. 2024: Saw Tracey Todd, found to have RSV.  Has increase in her torsemide.  Discussed the use of AI scribe software for clinical note transcription with the patient, who gave verbal consent to proceed.  Miss Tracey Todd, an 88 year old patient with a history of persistent atrial fibrillation, atrial flutter, and heart failure with preserved ejection fraction, presents for a routine follow-up. The patient has been managing well with her current treatment regimen, despite significant tricuspid regurgitation and the presence of a dual chamber permanent pacemaker for sick sinus syndrome.  The patient reports feeling generally well, with no significant changes in her breathing or overall health. However, she expresses a desire to be more active, but is limited by leg and hip pain, as well as persistent swelling in the legs. The patient denies any chest pain or irregular heartbeats. She sleeps well at night without the need for additional propping and does not report any significant symptoms related to her heart condition.  The patient's weight has been stable around 185 pounds, and she is currently taking torsemide every other day to manage fluid retention. She reports no significant side effects from this medication. The patient has been able to travel and engage in  activities she enjoys, indicating a good quality of life despite her chronic conditions.  The patient's heart condition, specifically the severe tricuspid regurgitation, is being closely monitored. The patient is aware of this plan and understands the potential future need for more aggressive management of her heart condition.   Past Medical History:  Diagnosis Date   Atrial flutter (HCC)    a. mentioned in 2016 admission.   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 minimal by cath 2016.   Nodule of chest wall    Normal coronary arteries and LVF 10/23/14 10/24/2014   Pacemaker implanted 10/25/14- St Jude 10/26/2014   Paroxysmal atrial fibrillation (HCC)    a. identified on device interrogation, burden low   Pulmonary hypertension (HCC)    Sick sinus syndrome (HCC)    a. s/p STJ dual chamber PPM    Type II or unspecified type diabetes mellitus without mention of complication, not stated as uncontrolled    Vitamin D deficiency     Past Surgical History:  Procedure Laterality Date   BREAST SURGERY     CATARACT EXTRACTION     LEFT AND RIGHT HEART CATHETERIZATION WITH CORONARY ANGIOGRAM N/A 10/23/2014   Procedure: LEFT AND RIGHT HEART CATHETERIZATION WITH CORONARY ANGIOGRAM;  Surgeon: Micheline Chapman, MD;  Location[facility] CATH LAB;  Service: Cardiovascular;  Laterality: N/A;  PERMANENT PACEMAKER INSERTION N/A 10/25/2014   STJ dual chamber PPM implanted by Dr [name] for SSS   TONSILLECTOMY AND ADENOIDECTOMY     TOTAL KNEE ARTHROPLASTY      Current Medications: Current Meds  Medication Sig   amLODipine (NORVASC) 5 MG tablet Take 1 tablet by mouth once daily   atorvastatin (LIPITOR) 80 MG tablet Take 1 tablet (80 mg total) by mouth daily.   bisoprolol (ZEBETA) 10 MG tablet Take 1 tablet by mouth once daily for blood  pressure   Blood Glucose Monitoring Suppl (ONETOUCH VERIO) w/Device KIT Use to check fasting blood sugar daily. Dx: E11.59   brimonidine (ALPHAGAN) 0.2 % ophthalmic solution Place 1 drop into both eyes 2 (two) times daily.   calcium carbonate (OS-CAL) 600 MG TABS tablet Take 600 mg by mouth daily with breakfast.   cetirizine (ZYRTEC) 10 MG chewable tablet as needed for allergies.   Cholecalciferol (VITAMIN D) 2000 UNITS CAPS Take 6,000 Units by mouth daily.    COSOPT 2-0.5 % ophthalmic solution 1 drop 2 (two) times daily.   dapagliflozin propanediol (FARXIGA) 10 MG TABS tablet Take 1 tablet (10 mg total) by mouth daily before breakfast.   gabapentin (NEURONTIN) 300 MG capsule Take 1 capsule (300 mg total) by mouth 2 (two) times daily.   glucose blood (ONETOUCH VERIO) test strip USE TO CHECK FASTING BLOOD SUGAR TWICE DAILY   insulin glargine (LANTUS SOLOSTAR) 100 UNIT/ML Solostar Pen Inject 8 Units into the skin at bedtime.   Lancets (ONETOUCH DELICA PLUS LANCET33G) MISC USE TO CHECK FASTING BLOOD SUGAR TWICE DAILY   latanoprost (XALATAN) 0.005 % ophthalmic solution Place into both eyes daily.   Lifitegrast (XIIDRA) 5 % SOLN Apply to eye daily at 6 (six) AM.   Magnesium 500 MG TABS Take 1,000 mg by mouth in the morning and at bedtime. 2 tabs am, 2 tab pm   Semaglutide,0.25 or 0.5MG /DOS, (OZEMPIC, 0.25 OR 0.5 MG/DOSE,) 2 MG/3ML SOPN once a week.   spironolactone (ALDACTONE) 25 MG tablet Take 1/2 (one-half) tablet by mouth once daily   torsemide (DEMADEX) 20 MG tablet Take 2 tablets (40 mg total) by mouth every other day.   triamcinolone (KENALOG) 0.025 % cream Apply 1 Application topically 2 (two) times daily.   warfarin (COUMADIN) 5 MG tablet TAKE 1 & 1/2 (ONE & ONE-HALF) TABLETS BY MOUTH ONCE DAILY AS DIRECTED BY  ANTICOAGULATION  CLINIC   [DISCONTINUED] dapagliflozin propanediol (FARXIGA) 10 MG TABS tablet Take 1 tablet (10 mg total) by mouth daily before breakfast.     Allergies:    Penicillin g, Penicillins, Quinapril, Other, Piroxicam, Calcium-containing compounds, Celecoxib, and Oxaprozin   Social History   Socioeconomic History   Marital status: Married    Spouse name: Not on file   Number of children: Not on file   Years of education: Not on file   Highest education level: Not on file  Occupational History   Not on file  Tobacco Use   Smoking status: Former    Current packs/day: 0.00    Types: Cigarettes    Quit date: 12/06/1963    Years since quitting: 59.6    Passive exposure: Never   Smokeless tobacco: Never  Vaping Use   Vaping status: Never Used  Substance and Sexual Activity   Alcohol use: No   Drug use: Never   Sexual activity: Not Currently  Other Topics Concern   Not on file  Social History Narrative   Left Handed    Lives  in a one story home       Loss her husband 10/03/22   Social Determinants of Health   Financial Resource Strain: Low Risk  (05/28/2021)   Overall Financial Resource Strain (CARDIA)    Difficulty of Paying Living Expenses: Not very hard  Food Insecurity: No Food Insecurity (05/28/2021)   Hunger Vital Sign    Worried About Running Out of Food in the Last Year: Never true    Ran Out of Food in the Last Year: Never true  Transportation Needs: No Transportation Needs (05/28/2021)   PRAPARE - Administrator, Civil Service (Medical): No    Lack of Transportation (Non-Medical): No  Physical Activity: Inactive (05/28/2021)   Exercise Vital Sign    Days of Exercise per Week: 0 days    Minutes of Exercise per Session: 0 min  Stress: No Stress Concern Present (05/28/2021)   [employer] of Occupational Health - Occupational Stress Questionnaire    Feeling of Stress : Not at all  Social Connections: Moderately Integrated (05/28/2021)   Social Connection and Isolation Panel [NHANES]    Frequency of Communication with Friends and Family: More than three times a week    Frequency of Social Gatherings with Friends  and Family: More than three times a week    Attends Religious Services: More than 4 times per year    Active Member of [employer] or Organizations: No    Attends Banker Meetings: Never    Marital Status: Married    Social: Lost her husband for 69 year in 2024  Family History: The patient's family history includes Cancer in her father and mother; Diabetes in her mother. There is no history of Breast cancer.  ROS:   Please see the history of present illness.     EKGs/Labs/Other Studies Reviewed:    The following studies were reviewed today:  EKG:    Cardiac Studies & Procedures       ECHOCARDIOGRAM  ECHOCARDIOGRAM COMPLETE 03/11/2023  Narrative ECHOCARDIOGRAM REPORT    Patient Name:   FELIPA VANROSSUM Date of Exam: 03/11/2023 Medical Rec #:  [id number]       Height:       58.0 in Accession #:    [id number]      Weight:       185.0 lb Date of Birth:  August 13, 1935       BSA:          1.762 m Patient Age:    87 years        BP:           118/68 mmHg Patient Gender: F               HR:           64 bpm. Exam Location:  [address]  Procedure: 2D Echo, Cardiac Doppler and Color Doppler  Indications:    I36.1 Nonrheumatic tricuspid (valve) insufficiency; I25.118 Coronary artery disease  History:        Patient has prior history of Echocardiogram examinations, most recent 08/06/2022. CHF, Pacemaker, Arrythmias:Atrial Fibrillation and Atrial Flutter; Risk Factors:Hypertension, Diabetes, Dyslipidemia and Former Smoker. Pulmonary hypertension. Sick sinus syndrome. Obesity.  Sonographer:    Cathie Beams RCS Referring Phys: [id number] [facility] A Ysmael Hires  IMPRESSIONS   1. Left ventricular ejection fraction, by estimation, is 60 to 65%. The left ventricle has normal function. The left ventricle has no regional wall motion abnormalities. There is mild concentric left ventricular hypertrophy. Left  ventricular diastolic parameters are consistent with Grade I diastolic  dysfunction (impaired relaxation). 2. Right ventricular systolic function is normal. The right ventricular size is mildly enlarged. There is moderately elevated pulmonary artery systolic pressure. The estimated right ventricular systolic pressure is 45.2 mmHg. 3. Right atrial size was moderately dilated. 4. The mitral valve is normal in structure. Mild mitral valve regurgitation. No evidence of mitral stenosis. 5. Tricuspid valve regurgitation is moderate to severe. 6. The aortic valve is tricuspid. There is mild calcification of the aortic valve. Aortic valve regurgitation is trivial. No aortic stenosis is present. 7. The inferior vena cava is normal in size with greater than 50% respiratory variability, suggesting right atrial pressure of 3 mmHg.  FINDINGS Left Ventricle: Left ventricular ejection fraction, by estimation, is 60 to 65%. The left ventricle has normal function. The left ventricle has no regional wall motion abnormalities. The left ventricular internal cavity size was normal in size. There is mild concentric left ventricular hypertrophy. Left ventricular diastolic parameters are consistent with Grade I diastolic dysfunction (impaired relaxation).  Right Ventricle: The right ventricular size is mildly enlarged. No increase in right ventricular wall thickness. Right ventricular systolic function is normal. There is moderately elevated pulmonary artery systolic pressure. The tricuspid regurgitant velocity is 3.17 m/s, and with an assumed right atrial pressure of 5 mmHg, the estimated right ventricular systolic pressure is 45.2 mmHg.  Left Atrium: Left atrial size was normal in size.  Right Atrium: Right atrial size was moderately dilated.  Pericardium: There is no evidence of pericardial effusion.  Mitral Valve: The mitral valve is normal in structure. Mild mitral valve regurgitation. No evidence of mitral valve stenosis.  Tricuspid Valve: The tricuspid valve is not well  visualized. Tricuspid valve regurgitation is moderate to severe. No evidence of tricuspid stenosis.  Aortic Valve: The aortic valve is tricuspid. There is mild calcification of the aortic valve. Aortic valve regurgitation is trivial. No aortic stenosis is present.  Pulmonic Valve: The pulmonic valve was normal in structure. Pulmonic valve regurgitation is mild. No evidence of pulmonic stenosis.  Aorta: The aortic root is normal in size and structure.  Venous: The inferior vena cava is normal in size with greater than 50% respiratory variability, suggesting right atrial pressure of 3 mmHg.  IAS/Shunts: No atrial level shunt detected by color flow Doppler.   LEFT VENTRICLE PLAX 2D LVIDd:         4.20 cm   Diastology LVIDs:         2.70 cm   LV e' medial:    6.85 cm/s LV PW:         1.20 cm   LV E/e' medial:  9.3 LV IVS:        1.20 cm   LV e' lateral:   7.72 cm/s LVOT diam:     2.20 cm   LV E/e' lateral: 8.2 LV SV:         70 LV SV Index:   39 LVOT Area:     3.80 cm   RIGHT VENTRICLE RV S prime:     10.00 cm/s TAPSE (M-mode): 2.1 cm RVSP:           43.2 mmHg  LEFT ATRIUM             Index        RIGHT ATRIUM           Index LA diam:        3.70 cm 2.10 cm/m  RA Pressure: 3.00 mmHg LA Vol (A2C):   44.9 ml 25.49 ml/m  RA Area:     16.80 cm LA Vol (A4C):   32.4 ml 18.39 ml/m  RA Volume:   50.70 ml  28.78 ml/m LA Biplane Vol: 39.8 ml 22.59 ml/m AORTIC VALVE             PULMONIC VALVE LVOT Vmax:   73.40 cm/s  PR End Diast Vel: 6.45 msec LVOT Vmean:  51.700 cm/s LVOT VTI:    0.183 m  AORTA Ao Root diam: 3.20 cm Ao Asc diam:  3.30 cm  MITRAL VALVE               TRICUSPID VALVE MV Area (PHT): 2.94 cm    TR Peak grad:   40.2 mmHg MV Decel Time: 258 msec    TR Vmax:        317.00 cm/s MV E velocity: 63.40 cm/s  Estimated RAP:  3.00 mmHg MV A velocity: 90.10 cm/s  RVSP:           43.2 mmHg MV E/A ratio:  0.70 SHUNTS Systemic VTI:  0.18 m Systemic Diam: 2.20  cm  Arvilla Meres MD Electronically signed by Arvilla Meres MD Signature Date/Time: 03/11/2023/10:13:00 AM    Final              Recent Labs: 08/06/2022: Hemoglobin 11.6; NT-Pro BNP 2,368; Platelets 231; TSH 2.710 09/03/2022: BUN 18; Creatinine, Ser 0.98; Potassium 4.7; Sodium 138 03/11/2023: ALT 11  Recent Lipid Panel    Component Value Date/Time   CHOL 142 03/11/2023 0904   TRIG 90 03/11/2023 0904   HDL 57 03/11/2023 0904   CHOLHDL 2.5 03/11/2023 0904   CHOLHDL 2.5 06/02/2018 0950   VLDL 15 03/28/2017 0907   LDLCALC 68 03/11/2023 0904   LDLCALC 86 06/02/2018 0950     Physical Exam:    VS:  BP 110/60   Pulse 64   Ht 4\' 10"  (1.473 m)   Wt 187 lb (84.8 kg)   SpO2 95%   BMI 39.08 kg/m     Wt Readings from Last 3 Encounters:  07/12/23 187 lb (84.8 kg)  05/25/23 185 lb 12.8 oz (84.3 kg)  01/11/23 185 lb (83.9 kg)    GEN: Elderly female morbid obesity CARDIAC: IRIR  systolic murmur, no rubs, gallops  RESPIRATORY:  Clear to auscultation without rales, wheezing or rhonchi  ABDOMEN: Soft, non-tender, non-distended MUSCULOSKELETAL:  No LE edema No deformity  SKIN: Warm and dry NEUROLOGIC:  Alert and oriented x 3 PSYCHIATRIC:  Normal affect   ASSESSMENT:    1. Coronary artery disease of native artery of native heart with stable angina pectoris (HCC)   2. Pulmonary hypertension, unspecified (HCC)   3. Permanent atrial fibrillation (HCC)   4. Chronic heart failure with preserved ejection fraction (HFpEF) (HCC)   5. Nonrheumatic tricuspid valve regurgitation     PLAN:    Tricuspid Regurgitation WHO Type II Pulmonary Hypertension  Significant tricuspid regurgitation, unchanged since July. Related to permanent atrial fibrillation and pacemaker lead. Asymptomatic. Discussed potential future Triclip intervention if symptoms develop, noting it may improve symptoms but not extend life; no evidence of RV/PA Uncoupling - Monitor for symptoms - Consider  transesophageal echocardiogram and right heart catheterization if NYHA Class III symptoms develop - Discuss Triclip procedure if symptomatic; I suspect she would be technically difficulty  Heart Failure with Preserved Ejection Fraction (HFpEF) CKD stage IIIa Asymptomatic with NYHA Class I symptoms. Weight management crucial to  avoid fluid overload. Increase torsemide to daily if weight exceeds 185 lbs. Current weight 183.4 lbs. Backup plan discussed if symptoms worsen. - Continue current diuretics - Monitor weight, aim for 185 lbs - Increase torsemide to daily if weight exceeds 185 lbs - Repeat echocardiogram in July 2025 - Follow-up in one year unless symptoms worsen  Permanent atrial flutter. Pacemaker in place. Asymptomatic. Missing a week of pacemaker monitoring is acceptable if traveling. - Continue current management - Follow-up with electrophysiology (EP) for pacemaker check - continue coumadin as per coumadin clinic  Sick Sinus Syndrome s/p St. Jude DC PPM Managed with a dual chamber permanent pacemaker. Asymptomatic. - Follow-up with EP for pacemaker management - discussed remote monitoring if she goes to [place] with her daugther  HTN - BP controlled on current therapy  Mild CAD LDL at goal. Blood pressure well controlled - Continue current medications for hypertension and hyperlipidemia  Follow-up - Order echocardiogram for July 2025 - Schedule follow-up appointment in one year - Double torsemide or take daily if weight exceeds 185 lbs - Return to clinic if symptoms worsen.   Medication Adjustments/Labs and Tests Ordered: Current medicines are reviewed at length with the patient today.  Concerns regarding medicines are outlined above.  Orders Placed This Encounter  Procedures   EKG 12-Lead   ECHOCARDIOGRAM COMPLETE   No orders of the defined types were placed in this encounter.   Patient Instructions  Medication Instructions:  Your physician recommends that you  continue on your current medications as directed. Please refer to the Current Medication list given to you today.  *If you need a refill on your cardiac medications before your next appointment, please call your pharmacy*   Lab Work: NONE If you have labs (blood work) drawn today and your tests are completely normal, you will receive your results only by: MyChart Message (if you have MyChart) OR A paper copy in the mail If you have any lab test that is abnormal or we need to change your treatment, we will call you to review the results.   Testing/Procedures: July 2025- -       Your physician has requested that you have an echocardiogram. Echocardiography is a painless test that uses sound waves to create images of your heart. It provides your doctor with information about the size and shape of your heart and how well your heart's chambers and valves are working. This procedure takes approximately one hour. There are no restrictions for this procedure. Please do NOT wear cologne, perfume, aftershave, or lotions (deodorant is allowed). Please arrive 15 minutes prior to your appointment time.  Please note: We ask at that you not bring children with you during ultrasound (echo/ vascular) testing. Due to room size and safety concerns, children are not allowed in the ultrasound rooms during exams. Our front office staff cannot provide observation of children in our lobby area while testing is being conducted. An adult accompanying a patient to their appointment will only be allowed in the ultrasound room at the discretion of the ultrasound technician under special circumstances. We apologize for any inconvenience.    Follow-Up: At [facility], you and your health needs are our priority.  As part of our continuing mission to provide you with exceptional heart care, we have created designated Provider Care Teams.  These Care Teams include your primary Cardiologist (physician) and Advanced  Practice Providers (APPs -  Physician Assistants and Nurse Practitioners) who all work together to provide you with the  care you need, when you need it.  We recommend signing up for the patient portal called "MyChart".  Sign up information is provided on this After Visit Summary.  MyChart is used to connect with patients for Virtual Visits (Telemedicine).  Patients are able to view lab/test results, encounter notes, upcoming appointments, etc.  Non-urgent messages can be sent to your provider as well.   To learn more about what you can do with MyChart, go to [email or web address].    Your next appointment:   1 year(s)  Provider:   Riley Lam, MD       Signed, Christell Constant, MD  07/12/2023 10:53 AM    [facility]

## 2023-07-20 ENCOUNTER — Other Ambulatory Visit: Payer: Self-pay | Admitting: Family

## 2023-07-20 DIAGNOSIS — Z1231 Encounter for screening mammogram for malignant neoplasm of breast: Secondary | ICD-10-CM

## 2023-07-27 DIAGNOSIS — H01022 Squamous blepharitis right lower eyelid: Secondary | ICD-10-CM | POA: Diagnosis not present

## 2023-07-27 DIAGNOSIS — H02882 Meibomian gland dysfunction right lower eyelid: Secondary | ICD-10-CM | POA: Diagnosis not present

## 2023-07-27 DIAGNOSIS — H401131 Primary open-angle glaucoma, bilateral, mild stage: Secondary | ICD-10-CM | POA: Diagnosis not present

## 2023-07-27 DIAGNOSIS — H02885 Meibomian gland dysfunction left lower eyelid: Secondary | ICD-10-CM | POA: Diagnosis not present

## 2023-07-31 DIAGNOSIS — E119 Type 2 diabetes mellitus without complications: Secondary | ICD-10-CM | POA: Diagnosis not present

## 2023-08-03 DIAGNOSIS — L821 Other seborrheic keratosis: Secondary | ICD-10-CM | POA: Diagnosis not present

## 2023-08-08 ENCOUNTER — Other Ambulatory Visit: Payer: Self-pay

## 2023-08-08 DIAGNOSIS — I480 Paroxysmal atrial fibrillation: Secondary | ICD-10-CM

## 2023-08-08 DIAGNOSIS — Z7901 Long term (current) use of anticoagulants: Secondary | ICD-10-CM

## 2023-08-08 MED ORDER — WARFARIN SODIUM 5 MG PO TABS: ORAL_TABLET | ORAL | 3 refills | Status: DC

## 2023-08-09 ENCOUNTER — Ambulatory Visit: Payer: Medicare Other | Attending: Cardiovascular Disease

## 2023-08-09 DIAGNOSIS — I480 Paroxysmal atrial fibrillation: Secondary | ICD-10-CM | POA: Diagnosis not present

## 2023-08-09 DIAGNOSIS — Z7901 Long term (current) use of anticoagulants: Secondary | ICD-10-CM | POA: Diagnosis not present

## 2023-08-09 LAB — POCT INR: INR: 3.3 — AB (ref 2.0–3.0)

## 2023-08-09 NOTE — Patient Instructions (Signed)
EAT GREENS TONIGHT THEN Continue taking warfarin 1 TABLET DAILY, EXCEPT 1.5 TABLETS ON THURSDAY.  Continue eating 2 serving of greens per week plus your one day of brussels sprouts. Recheck INR in 4 weeks. Coumadin Clinic [phone] or [phone]

## 2023-08-15 ENCOUNTER — Ambulatory Visit: Payer: Medicare Other

## 2023-08-15 ENCOUNTER — Ambulatory Visit (INDEPENDENT_AMBULATORY_CARE_PROVIDER_SITE_OTHER): Payer: Medicare Other | Admitting: Family

## 2023-08-15 ENCOUNTER — Encounter: Payer: Self-pay | Admitting: Podiatry

## 2023-08-15 ENCOUNTER — Ambulatory Visit (INDEPENDENT_AMBULATORY_CARE_PROVIDER_SITE_OTHER): Payer: Medicare Other | Admitting: Podiatry

## 2023-08-15 VITALS — BP 99/64 | HR 61 | Temp 98.1°F | Resp 16 | Ht <= 58 in | Wt 186.8 lb

## 2023-08-15 DIAGNOSIS — Z0000 Encounter for general adult medical examination without abnormal findings: Secondary | ICD-10-CM

## 2023-08-15 DIAGNOSIS — H9193 Unspecified hearing loss, bilateral: Secondary | ICD-10-CM

## 2023-08-15 DIAGNOSIS — B351 Tinea unguium: Secondary | ICD-10-CM

## 2023-08-15 DIAGNOSIS — G8929 Other chronic pain: Secondary | ICD-10-CM

## 2023-08-15 DIAGNOSIS — M79676 Pain in unspecified toe(s): Secondary | ICD-10-CM | POA: Diagnosis not present

## 2023-08-15 DIAGNOSIS — Z794 Long term (current) use of insulin: Secondary | ICD-10-CM | POA: Diagnosis not present

## 2023-08-15 DIAGNOSIS — M25562 Pain in left knee: Secondary | ICD-10-CM | POA: Diagnosis not present

## 2023-08-15 DIAGNOSIS — Z96652 Presence of left artificial knee joint: Secondary | ICD-10-CM | POA: Diagnosis not present

## 2023-08-15 DIAGNOSIS — M25552 Pain in left hip: Secondary | ICD-10-CM

## 2023-08-15 DIAGNOSIS — E1141 Type 2 diabetes mellitus with diabetic mononeuropathy: Secondary | ICD-10-CM

## 2023-08-15 DIAGNOSIS — Z7985 Long-term (current) use of injectable non-insulin antidiabetic drugs: Secondary | ICD-10-CM

## 2023-08-15 DIAGNOSIS — E1142 Type 2 diabetes mellitus with diabetic polyneuropathy: Secondary | ICD-10-CM

## 2023-08-15 LAB — POCT GLYCOSYLATED HEMOGLOBIN (HGB A1C): Hemoglobin A1C: 8.3 % — AB (ref 4.0–5.6)

## 2023-08-15 NOTE — Progress Notes (Signed)
This patient returns to my office for at risk foot care.  This patient requires this care by a professional since this patient will be at risk due to having diabetes and coagulation defect.  Patient is taking coumadin.  This patient is unable to cut nails herself since the patient cannot reach her nails.These nails are painful walking and wearing shoes.  This patient presents for at risk foot care today.  General Appearance  Alert, conversant and in no acute stress.  Vascular  Dorsalis pedis and posterior tibial  pulses are weakly  palpable  bilaterally.  Capillary return is within normal limits  bilaterally. Temperature is within normal limits  Bilaterally. Absent hair.  Neurologic  Senn-Weinstein monofilament wire test within normal limits  bilaterally. Muscle power within normal limits bilaterally.  Nails Thick disfigured discolored nails with subungual debris  from hallux to fifth toes bilaterally. No evidence of bacterial infection or drainage bilaterally.  Orthopedic  No limitations of motion  feet .  No crepitus or effusions noted.  No bony pathology or digital deformities noted.  Skin  normotropic skin with no porokeratosis noted bilaterally.  No signs of infections or ulcers noted.     Onychomycosis  Pain in right toes  Pain in left toes  Consent was obtained for treatment procedures.   Mechanical debridement of nails 1-5  bilaterally performed with a nail nipper.  dremel usage great toe left foot.   Return office visit   3 months                   Told patient to return for periodic foot care and evaluation due to potential at risk complications.   Helane Gunther DPM

## 2023-08-15 NOTE — Progress Notes (Signed)
Subjective:   Tracey Todd is a 88 y.o. female who presents for Medicare Annual (Subsequent) preventive examination. She is accompanied by a friend of her daughter. Patient confirms she is established with Cardiology and Endocrinology for chronic conditions. States she keeps "bumping into things" with her left knee. Also, left hip pain. She requests xrays. She denies recent trauma/injury and red flag symptoms.   Visit Complete: In person  Patient Medicare AWV questionnaire was completed by the patient on ; I have confirmed that all information answered by patient is correct and no changes since this date.  Objective:    Today's Vitals   08/15/23 1048  BP: 99/64  Pulse: 61  Resp: 16  Temp: 98.1 F (36.7 C)  TempSrc: Oral  SpO2: 91%  Weight: 186 lb 12.8 oz (84.7 kg)  Height: 4\' 9"  (1.448 m)   Body mass index is 40.42 kg/m.  Physical Exam HENT:     Head: Normocephalic and atraumatic.     Right Ear: Tympanic membrane, ear canal and external ear normal.     Left Ear: Tympanic membrane, ear canal and external ear normal.     Nose: Nose normal.     Mouth/Throat:     Mouth: Mucous membranes are moist.     Pharynx: Oropharynx is clear.  Eyes:     Extraocular Movements: Extraocular movements intact.     Conjunctiva/sclera: Conjunctivae normal.     Pupils: Pupils are equal, round, and reactive to light.  Cardiovascular:     Rate and Rhythm: Normal rate and regular rhythm.     Pulses: Normal pulses.     Heart sounds: Normal heart sounds.  Pulmonary:     Effort: Pulmonary effort is normal.     Breath sounds: Normal breath sounds.  Musculoskeletal:        General: Normal range of motion.     Right shoulder: Normal.     Left shoulder: Normal.     Right upper arm: Normal.     Left upper arm: Normal.     Right elbow: Normal.     Left elbow: Normal.     Right forearm: Normal.     Left forearm: Normal.     Right wrist: Normal.     Left wrist: Normal.     Right hand:  Normal.     Left hand: Normal.     Cervical back: Normal, normal range of motion and neck supple.     Thoracic back: Normal.     Lumbar back: Normal.     Right hip: Normal.     Left hip: Normal.     Right upper leg: Normal.     Left upper leg: Normal.     Right knee: Normal.     Left knee: Normal.     Right lower leg: Normal.     Left lower leg: Normal.     Right ankle: Normal.     Left ankle: Normal.     Right foot: Normal.     Left foot: Normal.  Neurological:     General: No focal deficit present.     Mental Status: She is alert and oriented to person, place, and time.  Psychiatric:        Mood and Affect: Mood normal.        Behavior: Behavior normal.        08/15/2023   10:40 AM 01/11/2023    3:27 PM 08/06/2022   10:54 AM 11/20/2021  9:39 AM 05/28/2021    7:06 PM 07/20/2017    9:07 AM 06/08/2017    6:01 PM  Advanced Directives  Does Patient Have a Medical Advance Directive? Yes Yes No No No No No  Type of Special educational needs teacher of [place];Living will;Out of facility DNR (pink MOST or yellow form)       Does patient want to make changes to medical advance directive? Yes (MAU/Ambulatory/Procedural Areas - Information given)        Would patient like information on creating a medical advance directive?   Yes (Inpatient - patient defers creating a medical advance directive at this time - Information given)  No - Patient declined No - Patient declined No - Patient declined    Current Medications (verified) Outpatient Encounter Medications as of 08/15/2023  Medication Sig   amLODipine (NORVASC) 5 MG tablet Take 1 tablet by mouth once daily   atorvastatin (LIPITOR) 80 MG tablet Take 1 tablet (80 mg total) by mouth daily.   bisoprolol (ZEBETA) 10 MG tablet Take 1 tablet by mouth once daily for blood pressure   bisoprolol (ZEBETA) 10 MG tablet Take 1 tablet (10 mg total) by mouth daily. for blood pressure   Blood Glucose Monitoring Suppl (ONETOUCH VERIO)  w/Device KIT Use to check fasting blood sugar daily. Dx: E11.59   brimonidine (ALPHAGAN) 0.2 % ophthalmic solution Place 1 drop into both eyes 2 (two) times daily.   calcium carbonate (OS-CAL) 600 MG TABS tablet Take 600 mg by mouth daily with breakfast.   cetirizine (ZYRTEC) 10 MG chewable tablet as needed for allergies.   Cholecalciferol (VITAMIN D) 2000 UNITS CAPS Take 6,000 Units by mouth daily.    COSOPT 2-0.5 % ophthalmic solution 1 drop 2 (two) times daily.   dapagliflozin propanediol (FARXIGA) 10 MG TABS tablet Take 1 tablet (10 mg total) by mouth daily before breakfast.   gabapentin (NEURONTIN) 300 MG capsule Take 1 capsule (300 mg total) by mouth 2 (two) times daily.   glucose blood (ONETOUCH VERIO) test strip USE TO CHECK FASTING BLOOD SUGAR TWICE DAILY   insulin glargine (LANTUS SOLOSTAR) 100 UNIT/ML Solostar Pen Inject 8 Units into the skin at bedtime.   Lancets (ONETOUCH DELICA PLUS LANCET33G) MISC USE TO CHECK FASTING BLOOD SUGAR TWICE DAILY   latanoprost (XALATAN) 0.005 % ophthalmic solution Place into both eyes daily.   Lifitegrast (XIIDRA) 5 % SOLN Apply to eye daily at 6 (six) AM.   Magnesium 500 MG TABS Take 1,000 mg by mouth in the morning and at bedtime. 2 tabs am, 2 tab pm   Semaglutide,0.25 or 0.5MG /DOS, (OZEMPIC, 0.25 OR 0.5 MG/DOSE,) 2 MG/3ML SOPN once a week.   spironolactone (ALDACTONE) 25 MG tablet Take 1/2 (one-half) tablet by mouth once daily   torsemide (DEMADEX) 20 MG tablet Take 2 tablets (40 mg total) by mouth every other day.   triamcinolone (KENALOG) 0.025 % cream Apply 1 Application topically 2 (two) times daily.   warfarin (COUMADIN) 5 MG tablet TAKE 1 & 1/2 (ONE & ONE-HALF) TABLETS BY MOUTH ONCE DAILY AS DIRECTED BY  ANTICOAGULATION  CLINIC   No facility-administered encounter medications on file as of 08/15/2023.    Allergies (verified) Penicillin g, Penicillins, Quinapril, Other, Piroxicam, Calcium-containing compounds, Celecoxib, and Oxaprozin    History: Past Medical History:  Diagnosis Date   Atrial flutter (HCC)    a. mentioned in 2016 admission.   Chronic diastolic CHF (congestive heart failure) (HCC)    DJD (degenerative joint disease)  Fatty liver    H/O total knee replacement 02/13/2013   HTN (hypertension)    Hypercholesteremia    Mild CAD    a. minimal by cath 2016.   Nodule of chest wall    Normal coronary arteries and LVF 10/23/14 10/24/2014   Pacemaker implanted 10/25/14- St Jude 10/26/2014   Paroxysmal atrial fibrillation (HCC)    a. identified on device interrogation, burden low   Pulmonary hypertension (HCC)    Sick sinus syndrome (HCC)    a. s/p STJ dual chamber PPM    Type II or unspecified type diabetes mellitus without mention of complication, not stated as uncontrolled    Vitamin D deficiency    Past Surgical History:  Procedure Laterality Date   BREAST SURGERY     CATARACT EXTRACTION     LEFT AND RIGHT HEART CATHETERIZATION WITH CORONARY ANGIOGRAM N/A 10/23/2014   Procedure: LEFT AND RIGHT HEART CATHETERIZATION WITH CORONARY ANGIOGRAM;  Surgeon: Micheline Chapman, MD;  Location[facility] CATH LAB;  Service: Cardiovascular;  Laterality: N/A;   PERMANENT PACEMAKER INSERTION N/A 10/25/2014   STJ dual chamber PPM implanted by Dr [name] for SSS   TONSILLECTOMY AND ADENOIDECTOMY     TOTAL KNEE ARTHROPLASTY     Family History  Problem Relation Age of Onset   Diabetes Mother    Cancer Mother    Cancer Father    Breast cancer Neg Hx    Social History   Socioeconomic History   Marital status: Married    Spouse name: Not on file   Number of children: Not on file   Years of education: Not on file   Highest education level: Not on file  Occupational History   Not on file  Tobacco Use   Smoking status: Former    Current packs/day: 0.00    Types: Cigarettes    Quit date: 12/06/1963    Years since quitting: 59.7    Passive exposure: Never   Smokeless tobacco: Never  Vaping Use   Vaping status: Never  Used  Substance and Sexual Activity   Alcohol use: No   Drug use: Never   Sexual activity: Not Currently  Other Topics Concern   Not on file  Social History Narrative   Left Handed    Lives in a one story home       Loss her husband 10/03/22   Social Drivers of Health   Financial Resource Strain: Low Risk  (05/28/2021)   Overall Financial Resource Strain (CARDIA)    Difficulty of Paying Living Expenses: Not very hard  Food Insecurity: No Food Insecurity (05/28/2021)   Hunger Vital Sign    Worried About Running Out of Food in the Last Year: Never true    Ran Out of Food in the Last Year: Never true  Transportation Needs: No Transportation Needs (05/28/2021)   PRAPARE - Administrator, Civil Service (Medical): No    Lack of Transportation (Non-Medical): No  Physical Activity: Inactive (05/28/2021)   Exercise Vital Sign    Days of Exercise per Week: 0 days    Minutes of Exercise per Session: 0 min  Stress: No Stress Concern Present (05/28/2021)   [employer] of Occupational Health - Occupational Stress Questionnaire    Feeling of Stress : Not at all  Social Connections: Moderately Integrated (05/28/2021)   Social Connection and Isolation Panel [NHANES]    Frequency of Communication with Friends and Family: More than three times a week    Frequency of  Social Gatherings with Friends and Family: More than three times a week    Attends Religious Services: More than 4 times per year    Active Member of [employer] or Organizations: No    Attends Banker Meetings: Never    Marital Status: Married    Tobacco Counseling: Patient reports she is not current or recent smoker.  Clinical Intake:  Nutrition Risk Assessment:  Has the patient had any N/V/D within the last 2 months?  Intermittent diarrhea, denies nausea/vomiting Does the patient have any non-healing wounds?  No . Reports established with Dermatology. Has the patient had any unintentional weight loss or  weight gain?  No   Diabetes:  Is the patient diabetic?  Yes  If diabetic, was a CBG obtained today?  No  Did the patient bring in their glucometer from home?  No  How often do you monitor your CBG's? As needed.   Financial Strains and Diabetes Management:  Are you having any financial strains with the device, your supplies or your medication? No .  Does the patient want to be seen by Chronic Care Management for management of their diabetes?  No  Would the patient like to be referred to a Nutritionist or for Diabetic Management?  No   Diabetic Exams:  Diabetic Eye Exam: Completed patient states "a few weeks ago". Diabetic Foot Exam: Completed patient states "this morning".     Activities of Daily Living    08/15/2023   10:38 AM  In your present state of health, do you have any difficulty performing the following activities:  Hearing? 0  Vision? 0  Difficulty concentrating or making decisions? 0  Walking or climbing stairs? 1  Dressing or bathing? 1  Doing errands, shopping? 1  Preparing Food and eating ? Y  Using the Toilet? N  In the past six months, have you accidently leaked urine? Y  Do you have problems with loss of bowel control? N  Managing your Medications? Y  Managing your Finances? Y  Housekeeping or managing your Housekeeping? N    Patient Care Team: Rema Fendt, NP as PCP - General (Nurse Practitioner) Christell Constant, MD as PCP - Cardiology (Cardiology) Regan Lemming, MD as PCP - Electrophysiology (Cardiology) Mateo Flow, MD as Consulting Physician (Ophthalmology) Jens Som Madolyn Frieze, MD as Consulting Physician (Cardiology) Wendall Stade, MD as Consulting Physician (Cardiology) Carman Ching, MD (Inactive) as Consulting Physician (Gastroenterology) Marily Lente, NP (Inactive) as Nurse Practitioner (Cardiology) Glendale Chard, DO as Consulting Physician (Neurology) Lanier Prude, MD as Consulting Physician  (Cardiology)  Indicate any recent Medical Services you may have received from other than [facility] providers in the past year (date may be approximate).     Assessment:   This is a routine wellness examination for [facility].  Hearing/Vision screen - Established with Ophthalmology.  - Difficulty hearing bilaterally. Denies red flag symptoms. Would like to see ENT.   Goals Addressed: "Walk better".      05/25/2023    8:15 AM 11/12/2022   10:17 AM 06/30/2022   10:28 AM 03/01/2022    9:07 AM 02/01/2022    3:06 PM 09/14/2021    8:33 AM 07/30/2021    9:41 AM  PHQ 2/9 Scores  PHQ - 2 Score 0 0 0 0 0 0 0    Fall Risk    08/15/2023   10:40 AM 05/25/2023    8:14 AM 01/11/2023    3:27 PM 11/12/2022   10:17 AM  11/20/2021    9:39 AM  Fall Risk   Falls in the past year? 0 0 0 0 0  Number falls in past yr: 0 0 0 0 0  Injury with Fall? 0 0 0 0 0  Risk for fall due to : No Fall Risks No Fall Risks  No Fall Risks   Follow up   Falls evaluation completed Falls evaluation completed     MEDICARE RISK AT HOME: Medicare Risk at Home Any stairs in or around the home?: No If so, are there any without handrails?: No Home free of loose throw rugs in walkways, pet beds, electrical cords, etc?: Yes Adequate lighting in your home to reduce risk of falls?: Yes Life alert?: Yes Use of a cane, walker or w/c?: Yes Grab bars in the bathroom?: Yes Shower chair or bench in shower?: Yes Elevated toilet seat or a handicapped toilet?: Yes  TIMED UP AND GO:  Was the test performed?  Yes  Length of time to ambulate 10 feet: 60 sec Gait slow and steady with assistive device    Cognitive Function:    08/15/2023   10:42 AM 06/08/2021    9:10 AM  MMSE - Mini Mental State Exam  Orientation to time 5 4  Orientation to Place 5 5  Registration 3 3  Attention/ Calculation 5 4  Recall 3 3  Language- name 2 objects 2 2  Language- repeat 1 1  Language- follow 3 step command 3 3  Language- read & follow direction 1 1   Write a sentence 0 1  Copy design 0 1  Total score 28 28        08/15/2023   10:44 AM 05/28/2021    7:10 PM  6CIT Screen  What Year? 0 points 0 points  What month? 0 points 0 points  What time? 0 points 0 points  Count back from 20 0 points 0 points  Months in reverse 0 points 0 points  Repeat phrase 0 points 0 points  Total Score 0 points 0 points    Immunizations Immunization History  Administered Date(s) Administered   Fluad Quad(high Dose 65+) 05/28/2022   Influenza Split 05/23/2013   Influenza, High Dose Seasonal PF 05/30/2014, 05/19/2015, 06/02/2016, 05/26/2017   Influenza,inj,Quad PF,6+ Mos 05/16/2019, 05/19/2020   Influenza-Unspecified 06/02/2018, 06/02/2021   PFIZER(Purple Top)SARS-COV-2 Vaccination 10/26/2019, 11/20/2019   Pneumococcal Conjugate-13 08/06/2015, 04/09/2019   Pneumococcal Polysaccharide-23 07/29/2013   Td 08/31/2011   Tdap 11/30/2021    TDAP status: Up to date  Flu Vaccine status: Up to date  Pneumococcal vaccine status: Up to date  Covid-19 vaccine status: Information provided on how to obtain vaccines.   Qualifies for Shingles Vaccine? No   Zostavax completed No   Shingrix Completed?: No.    Education has been provided regarding the importance of this vaccine. Patient has been advised to call insurance company to determine out of pocket expense if they have not yet received this vaccine. Advised may also receive vaccine at local pharmacy or Health Dept. Verbalized acceptance and understanding.  Screening Tests Health Maintenance  Topic Date Due   Zoster Vaccines- Shingrix (1 of 2) Never done   COVID-19 Vaccine (3 - Pfizer risk series) 12/18/2019   OPHTHALMOLOGY EXAM  07/14/2022   FOOT EXAM  08/10/2022   INFLUENZA VACCINE  11/28/2023 (Originally 03/31/2023)   HEMOGLOBIN A1C  02/13/2024   Medicare Annual Wellness (AWV)  08/14/2024   DTaP/Tdap/Td (3 - Td or Tdap) 12/01/2031   Pneumonia Vaccine  73+ Years old  Completed   DEXA SCAN   Completed   HPV VACCINES  Aged Out    Health Maintenance  Health Maintenance Due  Topic Date Due   Zoster Vaccines- Shingrix (1 of 2) Never done   COVID-19 Vaccine (3 - Pfizer risk series) 12/18/2019   OPHTHALMOLOGY EXAM  07/14/2022   FOOT EXAM  08/10/2022    Colorectal cancer screening: No longer required.   Mammogram status: No longer required due to  .  Bone Density status: Completed 12/26/2017. Results reflect: Bone density results: NORMAL. Repeat every   years.  Lung Cancer Screening: (Low Dose CT Chest recommended if Age 3-80 years, 20 pack-year currently smoking OR have quit w/in 15years.) does not qualify.   Lung Cancer Screening Referral:  Additional Screening:  Hepatitis C Screening: does qualify; Completed    Vision Screening: Recommended annual ophthalmology exams for early detection of glaucoma and other disorders of the eye. Is the patient up to date with their annual eye exam?  Yes Who is the provider or what is the name of the office in which the patient attends annual eye exams? No name given.  If pt is not established with a provider, would they like to be referred to a provider to establish care? Not applicable.   Dental Screening: Recommended annual dental exams for proper oral hygiene  Diabetic Foot Exam: Complete 08/15/2023  Community Resource Referral / Chronic Care Management: CRR required this visit?  No   CCM required this visit?  No   Plan:  1. Encounter for Medicare annual wellness exam (Primary) - Counseled on 150 minutes of exercise per week as tolerated, healthy eating (including decreased daily intake of saturated fats, cholesterol, added sugars, sodium), STI prevention, and routine healthcare maintenance.  2. Type 2 diabetes mellitus with diabetic mononeuropathy, without long-term current use of insulin (HCC) - Hemoglobin A1c 8.3%.  - Continue present management. - Keep all scheduled appointments with Endocrinology.  - POCT  glycosylated hemoglobin (Hb A1C)  3. Chronic left hip pain - Diagnostic xray left hip for evaluation.  - DG Hip Unilat W OR W/O Pelvis 2-3 Views Left; Future  4. Left knee pain, unspecified chronicity - Diagnostic xray left knee for evaluation. - DG Knee Complete 4 Views Left; Future  5. Hearing difficulty of both ears - Referral to ENT for evaluation/management.  - Ambulatory referral to ENT  I have personally reviewed and noted the following in the patient's chart:   Medical and social history Use of alcohol, tobacco or illicit drugs  Current medications and supplements including opioid prescriptions. Patient is not currently taking opioid prescriptions. Functional ability and status Nutritional status Physical activity Advanced directives List of other physicians Hospitalizations, surgeries, and ER visits in previous 12 months Vitals Screenings to include cognitive, depression, and falls Referrals and appointments  In addition, I have reviewed and discussed with patient certain preventive protocols, quality metrics, and best practice recommendations. A written personalized care plan for preventive services as well as general preventive health recommendations were provided to patient.     Rema Fendt, NP   08/15/2023   After Visit Summary: (In Person-Printed) AVS printed and given to the patient

## 2023-08-15 NOTE — Progress Notes (Signed)
Subjective:   Tracey Todd is a 88 y.o. female who presents for Medicare Annual (Subsequent) preventive examination.  Visit Complete: In person  Patient Medicare AWV questionnaire was completed by the patient on ; I have confirmed that all information answered by patient is correct and no changes since this date.        Objective:    Today's Vitals   08/15/23 1048  BP: 99/64  Pulse: 61  Resp: 16  Temp: 98.1 F (36.7 C)  TempSrc: Oral  SpO2: 91%  Weight: 186 lb 12.8 oz (84.7 kg)  Height: 4\' 9"  (1.448 m)   Body mass index is 40.42 kg/m.     08/15/2023   10:40 AM 01/11/2023    3:27 PM 08/06/2022   10:54 AM 11/20/2021    9:39 AM 05/28/2021    7:06 PM 07/20/2017    9:07 AM 06/08/2017    6:01 PM  Advanced Directives  Does Patient Have a Medical Advance Directive? Yes Yes No No No No No  Type of Special educational needs teacher of [place];Living will;Out of facility DNR (pink MOST or yellow form)       Does patient want to make changes to medical advance directive? Yes (MAU/Ambulatory/Procedural Areas - Information given)        Would patient like information on creating a medical advance directive?   Yes (Inpatient - patient defers creating a medical advance directive at this time - Information given)  No - Patient declined No - Patient declined No - Patient declined    Current Medications (verified) Outpatient Encounter Medications as of 08/15/2023  Medication Sig   amLODipine (NORVASC) 5 MG tablet Take 1 tablet by mouth once daily   atorvastatin (LIPITOR) 80 MG tablet Take 1 tablet (80 mg total) by mouth daily.   bisoprolol (ZEBETA) 10 MG tablet Take 1 tablet by mouth once daily for blood pressure   bisoprolol (ZEBETA) 10 MG tablet Take 1 tablet (10 mg total) by mouth daily. for blood pressure   Blood Glucose Monitoring Suppl (ONETOUCH VERIO) w/Device KIT Use to check fasting blood sugar daily. Dx: E11.59   brimonidine (ALPHAGAN) 0.2 % ophthalmic solution Place 1  drop into both eyes 2 (two) times daily.   calcium carbonate (OS-CAL) 600 MG TABS tablet Take 600 mg by mouth daily with breakfast.   cetirizine (ZYRTEC) 10 MG chewable tablet as needed for allergies.   Cholecalciferol (VITAMIN D) 2000 UNITS CAPS Take 6,000 Units by mouth daily.    COSOPT 2-0.5 % ophthalmic solution 1 drop 2 (two) times daily.   dapagliflozin propanediol (FARXIGA) 10 MG TABS tablet Take 1 tablet (10 mg total) by mouth daily before breakfast.   gabapentin (NEURONTIN) 300 MG capsule Take 1 capsule (300 mg total) by mouth 2 (two) times daily.   glucose blood (ONETOUCH VERIO) test strip USE TO CHECK FASTING BLOOD SUGAR TWICE DAILY   insulin glargine (LANTUS SOLOSTAR) 100 UNIT/ML Solostar Pen Inject 8 Units into the skin at bedtime.   Lancets (ONETOUCH DELICA PLUS LANCET33G) MISC USE TO CHECK FASTING BLOOD SUGAR TWICE DAILY   latanoprost (XALATAN) 0.005 % ophthalmic solution Place into both eyes daily.   Lifitegrast (XIIDRA) 5 % SOLN Apply to eye daily at 6 (six) AM.   Magnesium 500 MG TABS Take 1,000 mg by mouth in the morning and at bedtime. 2 tabs am, 2 tab pm   Semaglutide,0.25 or 0.5MG /DOS, (OZEMPIC, 0.25 OR 0.5 MG/DOSE,) 2 MG/3ML SOPN once a week.   spironolactone (  ALDACTONE) 25 MG tablet Take 1/2 (one-half) tablet by mouth once daily   torsemide (DEMADEX) 20 MG tablet Take 2 tablets (40 mg total) by mouth every other day.   triamcinolone (KENALOG) 0.025 % cream Apply 1 Application topically 2 (two) times daily.   warfarin (COUMADIN) 5 MG tablet TAKE 1 & 1/2 (ONE & ONE-HALF) TABLETS BY MOUTH ONCE DAILY AS DIRECTED BY  ANTICOAGULATION  CLINIC   No facility-administered encounter medications on file as of 08/15/2023.    Allergies (verified) Penicillin g, Penicillins, Quinapril, Other, Piroxicam, Calcium-containing compounds, Celecoxib, and Oxaprozin   History: Past Medical History:  Diagnosis Date   Atrial flutter (HCC)    a. mentioned in 2016 admission.   Chronic  diastolic CHF (congestive heart failure) (HCC)    DJD (degenerative joint disease)    Fatty liver    H/O total knee replacement 02/13/2013   HTN (hypertension)    Hypercholesteremia    Mild CAD    a. minimal by cath 2016.   Nodule of chest wall    Normal coronary arteries and LVF 10/23/14 10/24/2014   Pacemaker implanted 10/25/14- St Jude 10/26/2014   Paroxysmal atrial fibrillation (HCC)    a. identified on device interrogation, burden low   Pulmonary hypertension (HCC)    Sick sinus syndrome (HCC)    a. s/p STJ dual chamber PPM    Type II or unspecified type diabetes mellitus without mention of complication, not stated as uncontrolled    Vitamin D deficiency    Past Surgical History:  Procedure Laterality Date   BREAST SURGERY     CATARACT EXTRACTION     LEFT AND RIGHT HEART CATHETERIZATION WITH CORONARY ANGIOGRAM N/A 10/23/2014   Procedure: LEFT AND RIGHT HEART CATHETERIZATION WITH CORONARY ANGIOGRAM;  Surgeon: Micheline Chapman, MD;  Location: [facility] CATH LAB;  Service: Cardiovascular;  Laterality: N/A;   PERMANENT PACEMAKER INSERTION N/A 10/25/2014   STJ dual chamber PPM implanted by Dr [name] for SSS   TONSILLECTOMY AND ADENOIDECTOMY     TOTAL KNEE ARTHROPLASTY     Family History  Problem Relation Age of Onset   Diabetes Mother    Cancer Mother    Cancer Father    Breast cancer Neg Hx    Social History   Socioeconomic History   Marital status: Married    Spouse name: Not on file   Number of children: Not on file   Years of education: Not on file   Highest education level: Not on file  Occupational History   Not on file  Tobacco Use   Smoking status: Former    Current packs/day: 0.00    Types: Cigarettes    Quit date: 12/06/1963    Years since quitting: 59.7    Passive exposure: Never   Smokeless tobacco: Never  Vaping Use   Vaping status: Never Used  Substance and Sexual Activity   Alcohol use: No   Drug use: Never   Sexual activity: Not Currently  Other Topics  Concern   Not on file  Social History Narrative   Left Handed    Lives in a one story home       Loss her husband 10/03/22   Social Drivers of Health   Financial Resource Strain: Low Risk  (05/28/2021)   Overall Financial Resource Strain (CARDIA)    Difficulty of Paying Living Expenses: Not very hard  Food Insecurity: No Food Insecurity (05/28/2021)   Hunger Vital Sign    Worried About Running Out of Food  in the Last Year: Never true    Ran Out of Food in the Last Year: Never true  Transportation Needs: No Transportation Needs (05/28/2021)   PRAPARE - Administrator, Civil Service (Medical): No    Lack of Transportation (Non-Medical): No  Physical Activity: Inactive (05/28/2021)   Exercise Vital Sign    Days of Exercise per Week: 0 days    Minutes of Exercise per Session: 0 min  Stress: No Stress Concern Present (05/28/2021)   [employer] of Occupational Health - Occupational Stress Questionnaire    Feeling of Stress : Not at all  Social Connections: Moderately Integrated (05/28/2021)   Social Connection and Isolation Panel [NHANES]    Frequency of Communication with Friends and Family: More than three times a week    Frequency of Social Gatherings with Friends and Family: More than three times a week    Attends Religious Services: More than 4 times per year    Active Member of [employer] or Organizations: No    Attends Banker Meetings: Never    Marital Status: Married    Tobacco Counseling Counseling given: Not Answered   Clinical Intake:    Nutrition Risk Assessment:  Has the patient had any N/V/D within the last 2 months?   Does the patient have any non-healing wounds?   Has the patient had any unintentional weight loss or weight gain?    Diabetes:  Is the patient diabetic?  Yes  If diabetic, was a CBG obtained today?  No  Did the patient bring in their glucometer from home?  No  How often do you monitor your CBG's? .   Financial Strains  and Diabetes Management:  Are you having any financial strains with the device, your supplies or your medication? .  Does the patient want to be seen by Chronic Care Management for management of their diabetes?   Would the patient like to be referred to a Nutritionist or for Diabetic Management?    Diabetic Exams:                        Activities of Daily Living    08/15/2023   10:38 AM  In your present state of health, do you have any difficulty performing the following activities:  Hearing? 0  Vision? 0  Difficulty concentrating or making decisions? 0  Walking or climbing stairs? 1  Dressing or bathing? 1  Doing errands, shopping? 1  Preparing Food and eating ? Y  Using the Toilet? N  In the past six months, have you accidently leaked urine? Y  Do you have problems with loss of bowel control? N  Managing your Medications? Y  Managing your Finances? Y  Housekeeping or managing your Housekeeping? N    Patient Care Team: Rema Fendt, NP as PCP - General (Nurse Practitioner) Christell Constant, MD as PCP - Cardiology (Cardiology) Regan Lemming, MD as PCP - Electrophysiology (Cardiology) Mateo Flow, MD as Consulting Physician (Ophthalmology) Jens Som Madolyn Frieze, MD as Consulting Physician (Cardiology) Wendall Stade, MD as Consulting Physician (Cardiology) Carman Ching, MD (Inactive) as Consulting Physician (Gastroenterology) Marily Lente, NP (Inactive) as Nurse Practitioner (Cardiology) Glendale Chard, DO as Consulting Physician (Neurology) Lanier Prude, MD as Consulting Physician (Cardiology)  Indicate any recent Medical Services you may have received from other than [facility] providers in the past year (date may be approximate).     Assessment:   This is  a routine wellness examination for [facility].  Hearing/Vision screen No results found.   Goals Addressed   None   Depression Screen    05/25/2023    8:15 AM 11/12/2022    10:17 AM 06/30/2022   10:28 AM 03/01/2022    9:07 AM 02/01/2022    3:06 PM 09/14/2021    8:33 AM 07/30/2021    9:41 AM  PHQ 2/9 Scores  PHQ - 2 Score 0 0 0 0 0 0 0    Fall Risk    08/15/2023   10:40 AM 05/25/2023    8:14 AM 01/11/2023    3:27 PM 11/12/2022   10:17 AM 11/20/2021    9:39 AM  Fall Risk   Falls in the past year? 0 0 0 0 0  Number falls in past yr: 0 0 0 0 0  Injury with Fall? 0 0 0 0 0  Risk for fall due to : No Fall Risks No Fall Risks  No Fall Risks   Follow up   Falls evaluation completed Falls evaluation completed     MEDICARE RISK AT HOME: Medicare Risk at Home Any stairs in or around the home?: No If so, are there any without handrails?: No Home free of loose throw rugs in walkways, pet beds, electrical cords, etc?: Yes Adequate lighting in your home to reduce risk of falls?: Yes Life alert?: Yes Use of a cane, walker or w/c?: Yes Grab bars in the bathroom?: Yes Shower chair or bench in shower?: Yes Elevated toilet seat or a handicapped toilet?: Yes  TIMED UP AND GO:  Was the test performed?  Yes  Length of time to ambulate 10 feet: 60 sec Gait slow and steady with assistive device    Cognitive Function:    08/15/2023   10:42 AM 06/08/2021    9:10 AM  MMSE - Mini Mental State Exam  Orientation to time 5 4  Orientation to Place 5 5  Registration 3 3  Attention/ Calculation 5 4  Recall 3 3  Language- name 2 objects 2 2  Language- repeat 1 1  Language- follow 3 step command 3 3  Language- read & follow direction 1 1  Write a sentence 0 1  Copy design 0 1  Total score 28 28        08/15/2023   10:44 AM 05/28/2021    7:10 PM  6CIT Screen  What Year? 0 points 0 points  What month? 0 points 0 points  What time? 0 points 0 points  Count back from 20 0 points 0 points  Months in reverse 0 points 0 points  Repeat phrase 0 points 0 points  Total Score 0 points 0 points    Immunizations Immunization History  Administered Date(s) Administered    Fluad Quad(high Dose 65+) 05/28/2022   Influenza Split 05/23/2013   Influenza, High Dose Seasonal PF 05/30/2014, 05/19/2015, 06/02/2016, 05/26/2017   Influenza,inj,Quad PF,6+ Mos 05/16/2019, 05/19/2020   Influenza-Unspecified 06/02/2018, 06/02/2021   PFIZER(Purple Top)SARS-COV-2 Vaccination 10/26/2019, 11/20/2019   Pneumococcal Conjugate-13 08/06/2015, 04/09/2019   Pneumococcal Polysaccharide-23 07/29/2013   Td 08/31/2011   Tdap 11/30/2021    TDAP status: Up to date  Flu Vaccine status: Up to date  Pneumococcal vaccine status: Up to date  Covid-19 vaccine status: Information provided on how to obtain vaccines.   Qualifies for Shingles Vaccine? No   Zostavax completed No   Shingrix Completed?: No.    Education has been provided regarding the importance of this vaccine.  Patient has been advised to call insurance company to determine out of pocket expense if they have not yet received this vaccine. Advised may also receive vaccine at local pharmacy or Health Dept. Verbalized acceptance and understanding.  Screening Tests Health Maintenance  Topic Date Due   Zoster Vaccines- Shingrix (1 of 2) Never done   COVID-19 Vaccine (3 - Pfizer risk series) 12/18/2019   OPHTHALMOLOGY EXAM  07/14/2022   FOOT EXAM  08/10/2022   INFLUENZA VACCINE  11/28/2023 (Originally 03/31/2023)   HEMOGLOBIN A1C  02/13/2024   Medicare Annual Wellness (AWV)  08/14/2024   DTaP/Tdap/Td (3 - Td or Tdap) 12/01/2031   Pneumonia Vaccine 25+ Years old  Completed   DEXA SCAN  Completed   HPV VACCINES  Aged Out    Health Maintenance  Health Maintenance Due  Topic Date Due   Zoster Vaccines- Shingrix (1 of 2) Never done   COVID-19 Vaccine (3 - Pfizer risk series) 12/18/2019   OPHTHALMOLOGY EXAM  07/14/2022   FOOT EXAM  08/10/2022    Colorectal cancer screening: No longer required.   Mammogram status: No longer required due to  .  Bone Density status: Completed 12/26/2017. Results reflect: Bone density  results: NORMAL. Repeat every   years.  Lung Cancer Screening: (Low Dose CT Chest recommended if Age 54-80 years, 20 pack-year currently smoking OR have quit w/in 15years.) does not qualify.   Lung Cancer Screening Referral:  Additional Screening:  Hepatitis C Screening: does qualify; Completed    Vision Screening: Recommended annual ophthalmology exams for early detection of glaucoma and other disorders of the eye. Is the patient up to date with their annual eye exam?  No  Who is the provider or what is the name of the office in which the patient attends annual eye exams?   If pt is not established with a provider, would they like to be referred to a provider to establish care? No .   Dental Screening: Recommended annual dental exams for proper oral hygiene  Diabetic Foot Exam: Diabetic Foot Exam: Overdue, Pt has been advised about the importance in completing this exam. Pt is scheduled for diabetic foot exam on  .  Community Resource Referral / Chronic Care Management: CRR required this visit?  No   CCM required this visit?  No     Plan:     I have personally reviewed and noted the following in the patient's chart:   Medical and social history Use of alcohol, tobacco or illicit drugs  Current medications and supplements including opioid prescriptions. Patient is not currently taking opioid prescriptions. Functional ability and status Nutritional status Physical activity Advanced directives List of other physicians Hospitalizations, surgeries, and ER visits in previous 12 months Vitals Screenings to include cognitive, depression, and falls Referrals and appointments  In addition, I have reviewed and discussed with patient certain preventive protocols, quality metrics, and best practice recommendations. A written personalized care plan for preventive services as well as general preventive health recommendations were provided to patient.     Kieth Brightly,  RMA   08/15/2023   After Visit Summary: (In Person-Printed) AVS printed and given to the patient  Nurse Notes:

## 2023-08-25 ENCOUNTER — Ambulatory Visit (INDEPENDENT_AMBULATORY_CARE_PROVIDER_SITE_OTHER): Payer: Medicare Other

## 2023-08-25 DIAGNOSIS — I495 Sick sinus syndrome: Secondary | ICD-10-CM

## 2023-08-25 LAB — CUP PACEART REMOTE DEVICE CHECK
Battery Remaining Longevity: 16 mo
Battery Remaining Percentage: 13 %
Battery Voltage: 2.87 V
Brady Statistic AP VP Percent: 99 %
Brady Statistic AP VS Percent: 1 %
Brady Statistic AS VP Percent: 1 %
Brady Statistic AS VS Percent: 1 %
Brady Statistic RA Percent Paced: 41 %
Brady Statistic RV Percent Paced: 99 %
Date Time Interrogation Session: 20241226020015
Implantable Lead Connection Status: 753985
Implantable Lead Connection Status: 753985
Implantable Lead Implant Date: 20160226
Implantable Lead Implant Date: 20160226
Implantable Lead Location: 753859
Implantable Lead Location: 753860
Implantable Lead Model: 1948
Implantable Pulse Generator Implant Date: 20160226
Lead Channel Impedance Value: 410 Ohm
Lead Channel Impedance Value: 550 Ohm
Lead Channel Pacing Threshold Amplitude: 0.625 V
Lead Channel Pacing Threshold Amplitude: 0.75 V
Lead Channel Pacing Threshold Pulse Width: 0.4 ms
Lead Channel Pacing Threshold Pulse Width: 0.4 ms
Lead Channel Sensing Intrinsic Amplitude: 1.8 mV
Lead Channel Sensing Intrinsic Amplitude: 6.9 mV
Lead Channel Setting Pacing Amplitude: 1.625
Lead Channel Setting Pacing Amplitude: 2.5 V
Lead Channel Setting Pacing Pulse Width: 0.4 ms
Lead Channel Setting Sensing Sensitivity: 2 mV
Pulse Gen Model: 2240
Pulse Gen Serial Number: 7700661

## 2023-08-27 ENCOUNTER — Other Ambulatory Visit: Payer: Self-pay | Admitting: Internal Medicine

## 2023-08-27 DIAGNOSIS — I5033 Acute on chronic diastolic (congestive) heart failure: Secondary | ICD-10-CM

## 2023-08-29 ENCOUNTER — Telehealth: Payer: Self-pay | Admitting: Family

## 2023-08-29 ENCOUNTER — Telehealth: Payer: Self-pay

## 2023-08-29 ENCOUNTER — Other Ambulatory Visit: Payer: Self-pay | Admitting: Family

## 2023-08-29 DIAGNOSIS — M25562 Pain in left knee: Secondary | ICD-10-CM

## 2023-08-29 DIAGNOSIS — G8929 Other chronic pain: Secondary | ICD-10-CM

## 2023-08-29 NOTE — Telephone Encounter (Signed)
Outreach made to Pt.  She has not noticed any increase in fatigue or SOB.  Follow up appointment made in February 2025 per recall.  Pt will call if she develops any symptoms.

## 2023-08-29 NOTE — Telephone Encounter (Signed)
Please call patient with image results.

## 2023-08-29 NOTE — Telephone Encounter (Signed)
Called patient phone continuously rang.

## 2023-08-29 NOTE — Telephone Encounter (Signed)
-----   Message from Nurse Sherri P sent at 08/26/2023  6:06 PM EST ----- Forwarding to device clinic to address, I am out of office until 09/05/23 ----- Message ----- From: Regan Lemming, MD Sent: 08/26/2023   1:12 PM EST To: Baird Lyons, RN  Abnormal device interrogation reviewed.  Lead parameters and battery status stable.  Persistent AF. Needs AF clinic if feeling poorly.

## 2023-08-30 ENCOUNTER — Telehealth: Payer: Self-pay

## 2023-08-30 ENCOUNTER — Ambulatory Visit
Admission: RE | Admit: 2023-08-30 | Discharge: 2023-08-30 | Disposition: A | Discharge status: Home / Self Care | Payer: Medicare Other | Source: Ambulatory Visit | Attending: Family | Admitting: Family

## 2023-08-30 DIAGNOSIS — Z1231 Encounter for screening mammogram for malignant neoplasm of breast: Secondary | ICD-10-CM

## 2023-08-30 NOTE — Telephone Encounter (Signed)
 Pt called for her lab results. Shared provider's note. Pt has apt with her ortho for Jan 21st.  Greig JINNY Drones, NP 08/29/2023 12:34 PM EST     Call patient with update.   Impression: 1. Advanced left hip arthropathy without significant interval change. 2. Moderate to advanced right hip arthropathy.   Referral to Orthopedics for evaluation/management. Expect call soon with appointment details.

## 2023-08-30 NOTE — Telephone Encounter (Signed)
Pt. Received imaging results 08/29/23.

## 2023-08-31 DIAGNOSIS — E119 Type 2 diabetes mellitus without complications: Secondary | ICD-10-CM | POA: Diagnosis not present

## 2023-09-06 ENCOUNTER — Ambulatory Visit: Payer: Medicare Other | Attending: Internal Medicine

## 2023-09-06 ENCOUNTER — Telehealth: Payer: Self-pay | Admitting: Internal Medicine

## 2023-09-06 DIAGNOSIS — I480 Paroxysmal atrial fibrillation: Secondary | ICD-10-CM | POA: Diagnosis not present

## 2023-09-06 DIAGNOSIS — Z7901 Long term (current) use of anticoagulants: Secondary | ICD-10-CM | POA: Diagnosis not present

## 2023-09-06 LAB — POCT INR: INR: 3.1 — AB (ref 2.0–3.0)

## 2023-09-06 NOTE — Telephone Encounter (Signed)
 Pt c/o swelling: STAT is pt has developed SOB within 24 hours  How much weight have you gained and in what time span? 2 lbs in 2 days   If swelling, where is the swelling located? Stomach and thighs   Are you currently taking a fluid pill? Yes  Are you currently SOB? No   Do you have a log of your daily weights (if so, list)? N/A   Have you gained 3 pounds in a day or 5 pounds in a week? Yes   Have you traveled recently? No

## 2023-09-06 NOTE — Patient Instructions (Signed)
 Take 0.5 tablet today only then Continue taking warfarin 1 TABLET DAILY, EXCEPT 1.5 TABLETS ON THURSDAY.  Continue eating 2 serving of greens per week plus your one day of brussels sprouts. Recheck INR in 4 weeks. Coumadin  Clinic [phone] or [phone]

## 2023-09-06 NOTE — Telephone Encounter (Signed)
 Called pt reports swelling to stomach (feels tight), thighs, feet and ankles.  First noted 2-3 days ago.   Reports the following wt 1/4-189 lbs, 1/5-188 lbs, 1/6-186 lbs and 1/7-187 lbs. Pt denies SOB but then reports notes it when getting into bed.  Has had a runny nose and coughing up yellow phlegm lately. Takes spironolactone  12.5 mg every day and torsemide  40 mg PO every day.  Per med list pt is to take 40 mg PO BID.  Advised pt will send message to MD to advise and see if pt needs an OV.

## 2023-09-07 ENCOUNTER — Ambulatory Visit: Payer: Self-pay

## 2023-09-07 DIAGNOSIS — E1165 Type 2 diabetes mellitus with hyperglycemia: Secondary | ICD-10-CM | POA: Diagnosis not present

## 2023-09-07 NOTE — Telephone Encounter (Signed)
 Message from [place] M sent at 09/07/2023 11:28 AM EST  Summary: experiencing a cough; her nose is running.   Pt stated she is experiencing a cough; her nose is running. Pt denied SOB and wheezing.  She stated she has a lot of phlegm.  Seeking clinical advice.         Chief Complaint: pst nasal drip, Symptoms: coughs up or clears mucous that is yellow, mild SOB (advised to call cardiologist also with h/o a fib) Frequency: 3 days Pertinent Negatives: Patient denies wheezing, Fever Disposition: [] ED /[] Urgent Care (no appt availability in office) / [] Appointment(In office/virtual)/ []  [facility]/ [x] Home Care/ [] Refused Recommended Disposition /[] [facility]/ []  Follow-up with PCP Additional Notes:   Reason for Disposition  Cough with cold symptoms (e.g., runny nose, postnasal drip, throat clearing)  Answer Assessment - Initial Assessment Questions 1. ONSET: When did the cough begin?      Monday  2. SEVERITY: How bad is the cough today?      Mild  3. SPUTUM: Describe the color of your sputum (none, dry cough; clear, white, yellow, green)     Yellow  4. HEMOPTYSIS: Are you coughing up any blood? If so ask: How much? (flecks, streaks, tablespoons, etc.)     no 5. DIFFICULTY BREATHING: Are you having difficulty breathing? If Yes, ask: How bad is it? (e.g., mild, moderate, severe)    - MILD: No SOB at rest, mild SOB with walking, speaks normally in sentences, can lie down, no retractions, pulse < 100.    - MODERATE: SOB at rest, SOB with minimal exertion and prefers to sit, cannot lie down flat, speaks in phrases, mild retractions, audible wheezing, pulse 100-120.    - SEVERE: Very SOB at rest, speaks in single words, struggling to breathe, sitting hunched forward, retractions, pulse > 120      mild 6. FEVER: Do you have a fever? If Yes, ask: What is your temperature, how was it measured, and when did it start?     no 7. CARDIAC HISTORY: Do  you have any history of heart disease? (e.g., heart attack, congestive heart failure)      CHF, afib 8. LUNG HISTORY: Do you have any history of lung disease?  (e.g., pulmonary embolus, asthma, emphysema)     no 10. OTHER SYMPTOMS: Do you have any other symptoms? (e.g., runny nose, wheezing, chest pain)       Runny nose, post drip, constantly clearing  Protocols used: Cough - Acute Productive-A-AH

## 2023-09-07 NOTE — Telephone Encounter (Signed)
 Called pt advised of MD recommendation: I would be concerned that daily torsemide  may lead to volume overload.  Would return to 40 mg PO BID.  If we have an open spot with me or scott before 09/21/23 ( f/u with [place]) this would be reasonable.  If not, BMP in 1-2 weeks on high diuretic dose.   Pt will keep OV with SW and have f/u labs at that OV.  Pt had no further concerns.

## 2023-09-12 NOTE — Telephone Encounter (Signed)
 Patient states she is coughing up yellow looking sputum now, with some wheezing and congestion. Symptoms have not improved with over the counter treatment. Patient is requesting an appointment now because she has a history of things turning into bronchitis. No appointments available in office. Patient has been scheduled at urgent care 09/13/23 at 1000.

## 2023-09-13 ENCOUNTER — Ambulatory Visit
Admission: RE | Admit: 2023-09-13 | Discharge: 2023-09-13 | Disposition: A | Discharge status: Home / Self Care | Payer: Medicare Other | Source: Ambulatory Visit

## 2023-09-13 VITALS — BP 124/70 | HR 60 | Temp 99.0°F | Resp 22 | Ht <= 58 in | Wt 186.7 lb

## 2023-09-13 DIAGNOSIS — J22 Unspecified acute lower respiratory infection: Secondary | ICD-10-CM

## 2023-09-13 MED ORDER — LEVOFLOXACIN 750 MG PO TABS: 750.0000 mg | ORAL_TABLET | Freq: Every day | ORAL | 0 refills | Status: AC

## 2023-09-13 NOTE — ED Triage Notes (Signed)
 Congestion - Entered by patient, symptoms onset early last week. Spoke with a nurse from PCP next door over the phone. Was advised to get some Robitussin but says that has brought her no relief.

## 2023-09-13 NOTE — ED Provider Notes (Signed)
 [name]    CSN: [id number] Arrival date & time: 09/13/23  0956      History   Chief Complaint Chief Complaint  Patient presents with   Cough    Congestion - Entered by patient    HPI Tracey Todd is a 89 y.o. female.   89 year old female, Tracey Todd, presents to urgent care for evaluation of nasal congestion and cough for over 1 week.  Pt reports yellow nasal congestion/phlegm. Patient states she called her PCP who advised Robitussin but she has not felt any better.  unknown illness exposure.  PMH: HTN,DM type 2, PAF,CHF  The history is provided by the patient. No language interpreter was used.    Past Medical History:  Diagnosis Date   Atrial flutter (HCC)    a. mentioned in 2016 admission.   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 minimal by cath 2016.   Nodule of chest wall    Normal coronary arteries and LVF 10/23/14 10/24/2014   Pacemaker implanted 10/25/14- St Jude 10/26/2014   Paroxysmal atrial fibrillation (HCC)    a. identified on device interrogation, burden low   Pulmonary hypertension (HCC)    Sick sinus syndrome (HCC)    a. s/p STJ dual chamber PPM    Type II or unspecified type diabetes mellitus without mention of complication, not stated as uncontrolled    Vitamin D  deficiency     Patient Active Problem List   Diagnosis Date Noted   Acute respiratory infection 09/13/2023   Nonrheumatic tricuspid valve regurgitation 12/03/2022   Coronary artery disease of native artery of native heart with stable angina pectoris (HCC) 12/03/2022   Atrial fibrillation (HCC) 08/11/2022   Hav (hallux abducto valgus), unspecified laterality 02/19/2022   Right ear impacted cerumen 11/06/2021   Chronic arthropathy 10/12/2019   Callus 10/12/2019   Long term (current) use of  anticoagulants 10/02/2018   Atherosclerosis of aorta (HCC) 07/20/2017   Diabetic neuropathy (HCC) 07/20/2017   Lumbar radiculopathy 03/30/2017   Pain of both hip joints 03/30/2017   Pulmonary hypertension, unspecified (HCC) 11/20/2015   BMI 34.0-34.9,adult 08/06/2015   Diabetes mellitus type 2, controlled (HCC) 04/25/2015   Sick sinus syndrome (HCC) 01/29/2015   Pacemaker implanted 10/25/14- St Jude 10/26/2014   Essential hypertension 10/22/2014   PAF-fib and flutter 10/22/2014   Congestive heart failure (HCC) 10/21/2014   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)  (HCC) 10/08/2014   Obesity 07/08/2014   Vitamin D  deficiency 09/28/2013   Medication management 09/28/2013   Hyperlipidemia    DJD (degenerative joint disease)    Fatty liver    S/P TKR (total knee replacement) 02/13/2013    Past Surgical History:  Procedure Laterality Date   BREAST SURGERY     CATARACT EXTRACTION     LEFT AND RIGHT HEART CATHETERIZATION WITH CORONARY ANGIOGRAM N/A 10/23/2014   Procedure: LEFT AND RIGHT HEART CATHETERIZATION WITH CORONARY ANGIOGRAM;  Surgeon: Ozell JONETTA Fell, MD;  Location: [facility] CATH LAB;  Service: Cardiovascular;  Laterality: N/A;   PERMANENT PACEMAKER INSERTION N/A 10/25/2014   STJ dual chamber PPM implanted by Dr Kelsie for SSS   TONSILLECTOMY AND ADENOIDECTOMY     TOTAL KNEE ARTHROPLASTY      OB History   No obstetric history on file.  Home Medications    Prior to Admission medications   Medication Sig Start Date End Date Taking? Authorizing Provider  levofloxacin  (LEVAQUIN ) 750 MG tablet Take 1 tablet (750 mg total) by mouth daily for 5 days. 09/13/23 09/18/23 Yes Tiahna Cure, Rilla, NP  MM PEN NEEDLES 32G X 4 MM MISC  09/12/23  Yes [provider]  amLODipine  (NORVASC ) 5 MG tablet Take 1 tablet by mouth once daily 05/05/23  Yes Lorren, Amy J, NP  atorvastatin  (LIPITOR) 80 MG tablet Take 1 tablet (80 mg total) by mouth daily. 12/03/22  Yes Chandrasekhar, Mahesh A, MD   bisoprolol  (ZEBETA ) 10 MG tablet Take 1 tablet by mouth once daily for blood pressure 07/12/23  Yes Lorren, Amy J, NP  bisoprolol  (ZEBETA ) 10 MG tablet Take 1 tablet (10 mg total) by mouth daily. for blood pressure 07/12/23  Yes Stephens, Amy J, NP  Blood Glucose Monitoring Suppl (ONETOUCH VERIO) w/Device KIT Use to check fasting blood sugar daily. Dx: E11.59 08/15/19  Yes Fleming, Zelda W, NP  brimonidine (ALPHAGAN) 0.2 % ophthalmic solution Place 1 drop into both eyes 2 (two) times daily. 05/06/23  Yes [provider]  calcium  carbonate (OS-CAL) 600 MG TABS tablet Take 600 mg by mouth daily with breakfast.   Yes [provider]  cetirizine (ZYRTEC) 10 MG chewable tablet as needed for allergies.    [provider]  Cholecalciferol (VITAMIN D ) 2000 UNITS CAPS Take 6,000 Units by mouth daily.    Yes [provider]  COSOPT 2-0.5 % ophthalmic solution 1 drop 2 (two) times daily. 12/28/22  Yes [provider]  dapagliflozin  propanediol (FARXIGA ) 10 MG TABS tablet Take 1 tablet (10 mg total) by mouth daily before breakfast. 04/27/23  Yes Chandrasekhar, Mahesh A, MD  gabapentin  (NEURONTIN ) 300 MG capsule Take 1 capsule (300 mg total) by mouth 2 (two) times daily. 01/11/23  Yes Patel, Donika K, DO  glucose blood (ONETOUCH VERIO) test strip USE TO CHECK FASTING BLOOD SUGAR TWICE DAILY 09/14/22  Yes Lorren, Amy J, NP  insulin  glargine (LANTUS SOLOSTAR) 100 UNIT/ML Solostar Pen Inject 8 Units into the skin at bedtime.   Yes [provider]  Lancets (ONETOUCH DELICA PLUS LANCET33G) MISC USE TO CHECK FASTING BLOOD SUGAR TWICE DAILY 09/14/22  Yes Lorren, Amy J, NP  latanoprost (XALATAN) 0.005 % ophthalmic solution Place into both eyes daily. 11/25/22   [provider]  Lifitegrast CARON) 5 % SOLN Apply to eye daily at 6 (six) AM.    [provider]  Magnesium  500 MG TABS Take 1,000 mg by mouth in the morning and at bedtime. 2 tabs am, 2 tab  pm   Yes [provider]  Semaglutide,0.25 or 0.5MG /DOS, (OZEMPIC, 0.25 OR 0.5 MG/DOSE,) 2 MG/3ML SOPN once a week. 04/21/23   [provider]  spironolactone  (ALDACTONE ) 25 MG tablet Take 1/2 (one-half) tablet by mouth once daily 07/07/23  Yes Chandrasekhar, Mahesh A, MD  torsemide  (DEMADEX ) 20 MG tablet Take 2 tablets by mouth twice daily 08/29/23  Yes Chandrasekhar, Mahesh A, MD  triamcinolone  (KENALOG ) 0.025 % cream Apply 1 Application topically 2 (two) times daily. 05/25/23   Lorren Greig PARAS, NP  warfarin (COUMADIN ) 5 MG tablet TAKE 1 & 1/2 (ONE & ONE-HALF) TABLETS BY MOUTH ONCE DAILY AS DIRECTED BY  ANTICOAGULATION  CLINIC 08/08/23  Yes Santo Stanly LABOR, MD    Family History Family History  Problem Relation Age of Onset   Diabetes Mother    Cancer Mother  Cancer Father    Breast cancer Neg Hx     Social History Social History   Tobacco Use   Smoking status: Former    Current packs/day: 0.00    Types: Cigarettes    Quit date: 12/06/1963    Years since quitting: 59.8    Passive exposure: Never   Smokeless tobacco: Never  Vaping Use   Vaping status: Never Used  Substance Use Topics   Alcohol use: No   Drug use: Never     Allergies   Penicillin g, Penicillins, Quinapril , Other, Piroxicam, Calcium -containing compounds, Celecoxib, and Oxaprozin   Review of Systems Review of Systems  Constitutional:  Negative for fever.  HENT:  Positive for congestion.   Respiratory:  Positive for cough. Negative for shortness of breath.   Cardiovascular:  Negative for chest pain and palpitations.  All other systems reviewed and are negative.    Physical Exam Triage Vital Signs ED Triage Vitals  Encounter Vitals Group     BP 09/13/23 1108 124/70     Systolic BP Percentile --      Diastolic BP Percentile --      Pulse Rate 09/13/23 1108 60     Resp 09/13/23 1108 (!) 22     Temp 09/13/23 1108 99 F (37.2 C)     Temp Source 09/13/23 1108 Oral     SpO2  09/13/23 1108 95 %     Weight 09/13/23 1102 186 lb 11.7 oz (84.7 kg)     Height 09/13/23 1102 4' 9 (1.448 m)     Head Circumference --      Peak Flow --      Pain Score 09/13/23 1101 0     Pain Loc --      Pain Education --      Exclude from Growth Chart --    No data found.  Updated Vital Signs BP 124/70 (BP Location: Right Arm)   Pulse 60   Temp 99 F (37.2 C) (Oral)   Resp (!) 22   Ht 4' 9 (1.448 m)   Wt 186 lb 11.7 oz (84.7 kg)   SpO2 95%   BMI 40.41 kg/m   Visual Acuity Right Eye Distance:   Left Eye Distance:   Bilateral Distance:    Right Eye Near:   Left Eye Near:    Bilateral Near:     Physical Exam Vitals and nursing note reviewed.  Constitutional:      General: She is not in acute distress.    Appearance: She is well-developed and well-groomed.  HENT:     Head: Normocephalic.     Right Ear: Tympanic membrane is retracted.     Left Ear: Tympanic membrane is retracted.     Nose: Congestion present.     Mouth/Throat:     Lips: Pink.     Mouth: Mucous membranes are moist.     Pharynx: Oropharynx is clear.  Eyes:     General: Lids are normal.     Conjunctiva/sclera: Conjunctivae normal.     Pupils: Pupils are equal, round, and reactive to light.  Neck:     Trachea: No tracheal deviation.  Cardiovascular:     Rate and Rhythm: Normal rate and regular rhythm.     Pulses: Normal pulses.     Heart sounds: Normal heart sounds. No murmur heard. Pulmonary:     Effort: Pulmonary effort is normal.     Breath sounds: Normal breath sounds and air entry.  Abdominal:  General: Bowel sounds are normal.     Palpations: Abdomen is soft.     Tenderness: There is no abdominal tenderness.  Musculoskeletal:        General: Normal range of motion.     Cervical back: Normal range of motion.  Lymphadenopathy:     Cervical: No cervical adenopathy.  Skin:    General: Skin is warm and dry.     Findings: No rash.  Neurological:     General: No focal deficit  present.     Mental Status: She is alert and oriented to person, place, and time.     GCS: GCS eye subscore is 4. GCS verbal subscore is 5. GCS motor subscore is 6.  Psychiatric:        Attention and Perception: Attention normal.        Mood and Affect: Mood normal.        Speech: Speech normal.        Behavior: Behavior normal. Behavior is cooperative.      UC Treatments / Results  Labs (all labs ordered are listed, but only abnormal results are displayed) Labs Reviewed - No data to display  EKG   Radiology No results found.  Procedures Procedures (including critical care time)  Medications Ordered in UC Medications - No data to display  Initial Impression / Assessment and Plan / UC Course  I have reviewed the triage vital signs and the nursing notes.  Pertinent labs & imaging results that were available during my care of the patient were reviewed by me and considered in my medical decision making (see chart for details).    Discussed exam findings and plan of care with patient,treating with Levaquin  due to comorbidities and penicillin allergy, strict go to ER precautions given.   Patient verbalized understanding to this provider.  Ddx: Acute respiratory infection, sinusitis, Viral illness Final Clinical Impressions(s) / UC Diagnoses   Final diagnoses:  Acute respiratory infection     Discharge Instructions      Take home meds as prescribed, keep a close check on blood sugar as it may be increased with illness. Take Levaquin  as directed. Drink plenty of water, follow up with PCP in 3 days if no improvement,sooner if worse.  If you develop chest pain shortness of breath or worsening symptoms go to the emergency room for further evaluation     ED Prescriptions     Medication Sig Dispense Auth. Provider   levofloxacin  (LEVAQUIN ) 750 MG tablet Take 1 tablet (750 mg total) by mouth daily for 5 days. 5 tablet Kaleb Linquist, Rilla, NP      PDMP not reviewed this  encounter.   Aminta Rilla, NP 09/13/23 1136

## 2023-09-13 NOTE — Discharge Instructions (Signed)
 Take home meds as prescribed, keep a close check on blood sugar as it may be increased with illness. Take Levaquin  as directed. Drink plenty of water, follow up with PCP in 3 days if no improvement,sooner if worse.  If you develop chest pain shortness of breath or worsening symptoms go to the emergency room for further evaluation

## 2023-09-14 DIAGNOSIS — E1165 Type 2 diabetes mellitus with hyperglycemia: Secondary | ICD-10-CM | POA: Diagnosis not present

## 2023-09-16 ENCOUNTER — Ambulatory Visit (INDEPENDENT_AMBULATORY_CARE_PROVIDER_SITE_OTHER): Payer: Medicare Other | Admitting: Family

## 2023-09-16 ENCOUNTER — Ambulatory Visit (INDEPENDENT_AMBULATORY_CARE_PROVIDER_SITE_OTHER): Payer: Medicare Other

## 2023-09-16 ENCOUNTER — Ambulatory Visit: Payer: Self-pay

## 2023-09-16 VITALS — BP 114/68 | HR 62 | Temp 97.7°F | Ht <= 58 in | Wt 190.2 lb

## 2023-09-16 DIAGNOSIS — R5383 Other fatigue: Secondary | ICD-10-CM | POA: Diagnosis not present

## 2023-09-16 DIAGNOSIS — R059 Cough, unspecified: Secondary | ICD-10-CM

## 2023-09-16 MED ORDER — PREDNISONE 10 MG PO TABS: ORAL_TABLET | ORAL | 0 refills | Status: AC

## 2023-09-16 MED ORDER — BENZONATATE 100 MG PO CAPS: 100.0000 mg | ORAL_CAPSULE | Freq: Three times a day (TID) | ORAL | 1 refills | Status: DC | PRN

## 2023-09-16 NOTE — Telephone Encounter (Signed)
Pt scheduled for sameday appt.

## 2023-09-16 NOTE — Progress Notes (Unsigned)
Patient states cough started a week ago.   Seems like patient is having a hard time breathing and talking.

## 2023-09-16 NOTE — Progress Notes (Unsigned)
Patient ID: Tracey Todd, female    DOB: 24-May-1935  MRN: [id number]  CC: Urgent Care Follow-Up  Subjective: Tracey Todd is a 89 y.o. female who presents for Urgent Care follow-up. She is accompanied by her daughter.   Her concerns today include:  Patient recently seen at Urgent Care for cough. She was prescribed an antibiotic and taking as prescribed. States since Urgent Care cough persisting. Denies red flag symptoms.   Patient Active Problem List   Diagnosis Date Noted   Acute respiratory infection 09/13/2023   Nonrheumatic tricuspid valve regurgitation 12/03/2022   Coronary artery disease of native artery of native heart with stable angina pectoris (HCC) 12/03/2022   Atrial fibrillation (HCC) 08/11/2022   Hav (hallux abducto valgus), unspecified laterality 02/19/2022   Right ear impacted cerumen 11/06/2021   Chronic arthropathy 10/12/2019   Callus 10/12/2019   Long term (current) use of anticoagulants 10/02/2018   Atherosclerosis of aorta (HCC) 07/20/2017   Diabetic neuropathy (HCC) 07/20/2017   Lumbar radiculopathy 03/30/2017   Pain of both hip joints 03/30/2017   Pulmonary hypertension, unspecified (HCC) 11/20/2015   BMI 34.0-34.9,adult 08/06/2015   Diabetes mellitus type 2, controlled (HCC) 04/25/2015   Sick sinus syndrome (HCC) 01/29/2015   Pacemaker implanted 10/25/14- St Jude 10/26/2014   Essential hypertension 10/22/2014   PAF-fib and flutter 10/22/2014   Congestive heart failure (HCC) 10/21/2014   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 90 tablet 3   bisoprolol (ZEBETA) 10 MG tablet Take 1 tablet by mouth once daily for blood pressure 90 tablet 0   bisoprolol (ZEBETA) 10 MG tablet Take 1 tablet (10 mg total) by mouth daily. for blood pressure 90 tablet 0   Blood Glucose Monitoring Suppl (ONETOUCH VERIO) w/Device KIT Use to check fasting blood sugar daily. Dx: E11.59 1 kit 0   brimonidine (ALPHAGAN) 0.2 % ophthalmic solution Place 1 drop into both eyes 2 (two) times daily.     calcium carbonate (OS-CAL) 600 MG TABS tablet Take 600 mg by mouth daily with breakfast.     cetirizine (ZYRTEC) 10 MG chewable tablet as needed for allergies.     Cholecalciferol (VITAMIN D) 2000 UNITS CAPS Take 6,000 Units by mouth daily.      dapagliflozin propanediol (FARXIGA) 10 MG TABS tablet Take 1 tablet (10 mg total) by mouth daily before breakfast. 90 tablet 3   gabapentin (NEURONTIN) 300 MG capsule Take 1 capsule (300 mg total) by mouth 2 (two) times daily. 180 capsule 3   glucose blood (ONETOUCH VERIO) test strip USE TO CHECK FASTING BLOOD SUGAR TWICE DAILY 100 each 11   insulin glargine (LANTUS SOLOSTAR) 100 UNIT/ML Solostar Pen Inject 8 Units into the skin at bedtime.     Lancets (ONETOUCH DELICA PLUS LANCET33G) MISC USE TO CHECK FASTING BLOOD SUGAR TWICE DAILY 100 each 2   Magnesium 500 MG TABS Take 1,000 mg by mouth in the morning and at bedtime. 2 tabs am, 2 tab  pm     MM PEN NEEDLES 32G X 4 MM MISC      Semaglutide,0.25 or 0.5MG /DOS, (OZEMPIC, 0.25 OR 0.5 MG/DOSE,) 2 MG/3ML SOPN once a week.     spironolactone (ALDACTONE) 25 MG tablet Take 1/2 (one-half) tablet by mouth once daily 45 tablet 1   torsemide (DEMADEX) 20 MG tablet Take 2 tablets by mouth twice daily 360 tablet 3   warfarin (COUMADIN) 5 MG tablet TAKE 1 & 1/2 (ONE & ONE-HALF) TABLETS BY MOUTH ONCE DAILY AS DIRECTED BY  ANTICOAGULATION  CLINIC 40 tablet 3   COSOPT 2-0.5 % ophthalmic solution 1 drop 2 (two) times daily. (Patient  not taking: Reported on 09/16/2023)     latanoprost (XALATAN) 0.005 % ophthalmic solution Place into both eyes daily. (Patient not taking: Reported on 09/16/2023)     Lifitegrast (XIIDRA) 5 % SOLN Apply to eye daily at 6 (six) AM. (Patient not taking: Reported on 09/16/2023)     triamcinolone (KENALOG) 0.025 % cream Apply 1 Application topically 2 (two) times daily. (Patient not taking: Reported on 09/16/2023) 60 g 0   No current facility-administered medications on file prior to visit.    Allergies  Allergen Reactions   Penicillin G Anaphylaxis   Penicillins Anaphylaxis   Quinapril Anaphylaxis    Angioedema   Other     Other Reaction(s): irritation of skin   Piroxicam Other (See Comments)    Bleeding  Other Reaction(s): blood in stool   Calcium-Containing Compounds Other (See Comments)    "calcium deposits on skin"   Celecoxib Itching   Oxaprozin Itching and Other (See Comments)    Social History   Socioeconomic History   Marital status: Married    Spouse name: Not on file   Number of children: Not on file   Years of education: Not on file   Highest education level: Not on file  Occupational History   Not on file  Tobacco Use   Smoking status: Former    Current packs/day: 0.00    Types: Cigarettes    Quit date: 12/06/1963    Years since quitting: 59.8    Passive exposure: Never   Smokeless tobacco: Never  Vaping Use   Vaping status: Never Used  Substance and Sexual Activity   Alcohol use: No   Drug use: Never   Sexual activity: Not Currently  Other Topics Concern   Not on file  Social History Narrative   Left Handed    Lives in a one story home       Loss her husband 10/03/22   Social Drivers of Health   Financial Resource Strain: Low Risk  (05/28/2021)   Overall Financial Resource Strain (CARDIA)    Difficulty of Paying Living Expenses: Not very hard  Food Insecurity: No Food Insecurity (05/28/2021)   Hunger Vital Sign    Worried About Running Out of Food in  the Last Year: Never true    Ran Out of Food in the Last Year: Never true  Transportation Needs: No Transportation Needs (05/28/2021)   PRAPARE - Administrator, Civil Service (Medical): No    Lack of Transportation (Non-Medical): No  Physical Activity: Inactive (05/28/2021)   Exercise Vital Sign    Days of Exercise per Week: 0 days    Minutes of Exercise per Session: 0 min  Stress: No Stress Concern Present (05/28/2021)   [employer] of Occupational Health - Occupational Stress Questionnaire    Feeling of Stress : Not at all  Social Connections: Moderately Integrated (05/28/2021)   Social Connection and Isolation Panel [NHANES]    Frequency of Communication with Friends and Family: More than three times a week    Frequency of Social Gatherings with Friends and Family: More than three times a week    Attends Religious Services: More than 4 times per year    Active Member of [employer] or Organizations: No    Attends Banker Meetings: Never    Marital Status: Married  Catering manager Violence: Not At Risk (05/28/2021)   Humiliation, Afraid, Rape, and Kick questionnaire    Fear of Current or Ex-Partner: No    Emotionally Abused: No    Physically Abused: No    Sexually Abused: No    Family History  Problem Relation Age of Onset   Diabetes Mother    Cancer Mother    Cancer Father    Breast cancer Neg Hx     Past Surgical History:  Procedure Laterality Date   BREAST SURGERY     CATARACT EXTRACTION     LEFT AND RIGHT HEART CATHETERIZATION WITH CORONARY ANGIOGRAM N/A 10/23/2014   Procedure: LEFT AND RIGHT HEART CATHETERIZATION WITH CORONARY ANGIOGRAM;  Surgeon: Micheline Chapman, MD;  Location: [facility] CATH LAB;  Service: Cardiovascular;  Laterality: N/A;   PERMANENT PACEMAKER INSERTION N/A 10/25/2014   STJ dual chamber PPM implanted by Dr [name] for SSS   TONSILLECTOMY AND ADENOIDECTOMY     TOTAL KNEE ARTHROPLASTY      ROS: Review of Systems Negative except  as stated above  PHYSICAL EXAM: BP 114/68   Pulse 62   Temp 97.7 F (36.5 C) (Oral)   Ht 4' 9.5" (1.461 m)   Wt 190 lb 3.2 oz (86.3 kg)   SpO2 94%   BMI 40.45 kg/m   Physical Exam HENT:     Head: Normocephalic and atraumatic.     Nose: Nose normal.     Mouth/Throat:     Mouth: Mucous membranes are moist.     Pharynx: Oropharynx is clear.  Eyes:     Extraocular Movements: Extraocular movements intact.     Conjunctiva/sclera: Conjunctivae normal.     Pupils: Pupils are equal, round, and reactive to light.  Cardiovascular:     Rate and Rhythm: Normal rate and regular rhythm.     Pulses: Normal pulses.     Heart sounds: Normal heart sounds.  Pulmonary:     Effort: Pulmonary effort is normal.     Breath sounds: Normal breath sounds.  Musculoskeletal:        General: Normal range of motion.     Cervical back: Normal range of motion and neck supple.  Neurological:     General: No focal deficit present.     Mental Status: She is alert and oriented to person, place, and time.  Psychiatric:        Mood and Affect: Mood normal.        Behavior: Behavior normal.     ASSESSMENT AND PLAN: 1. Cough, unspecified type (Primary) - Patient today in office with no cardiopulmonary/acute distress. - Benzonatate capsules and Prednisone as prescribed. Counseled on medication adherence/adverse effects.  - Routine screening.  - Diagnostic chest xray for evaluation. - Follow-up with primary provider as scheduled.  - DG Chest 2 View; Future - benzonatate (TESSALON PERLES) 100 MG capsule; Take 1 capsule (100 mg total) by mouth 3 (three) times daily as needed.  Dispense: 30 capsule; Refill: 1 - predniSONE (DELTASONE) 10 MG  tablet; Take 6 tablets (60 mg total) by mouth daily with breakfast for 1 day, THEN 5 tablets (50 mg total) daily with breakfast for 1 day, THEN 4 tablets (40 mg total) daily with breakfast for 1 day, THEN 3 tablets (30 mg total) daily with breakfast for 1 day, THEN 2 tablets  (20 mg total) daily with breakfast for 1 day, THEN 1 tablet (10 mg total) daily with breakfast for 1 day.  Dispense: 21 tablet; Refill: 0 - COVID-19, Flu A+B and RSV    Patient was given the opportunity to ask questions.  Patient verbalized understanding of the plan and was able to repeat key elements of the plan. Patient was given clear instructions to go to Emergency Department or return to medical center if symptoms don't improve, worsen, or new problems develop.The patient verbalized understanding.   Orders Placed This Encounter  Procedures   COVID-19, Flu A+B and RSV   DG Chest 2 View     Requested Prescriptions   Signed Prescriptions Disp Refills   benzonatate (TESSALON PERLES) 100 MG capsule 30 capsule 1    Sig: Take 1 capsule (100 mg total) by mouth 3 (three) times daily as needed.   predniSONE (DELTASONE) 10 MG tablet 21 tablet 0    Sig: Take 6 tablets (60 mg total) by mouth daily with breakfast for 1 day, THEN 5 tablets (50 mg total) daily with breakfast for 1 day, THEN 4 tablets (40 mg total) daily with breakfast for 1 day, THEN 3 tablets (30 mg total) daily with breakfast for 1 day, THEN 2 tablets (20 mg total) daily with breakfast for 1 day, THEN 1 tablet (10 mg total) daily with breakfast for 1 day.    Follow-up with primary provider as scheduled.   Rema Fendt, NP

## 2023-09-16 NOTE — Telephone Encounter (Signed)
Summary: Rx requested - still coughing   Pt was seen at UC on Tues. 09/13/2023 and given antibiotic. Pt taking last dose of antibiotic tomorrow, pt does not feel like it will help clear everything up. Pt still has cough, not as badly but it is still there. Pt requesting pearls if at possible to be sent in.     Chief Complaint: Seen in UC 09/13/23, finishing antibiotic tomorrow. Asking for more antibiotic and Tessalon Perles. Symptoms: Productive cough with yellow mucus Frequency: Last week Pertinent Negatives: Patient denies fever Disposition: [] ED /[] Urgent Care (no appt availability in office) / [] Appointment(In office/virtual)/ []  [facility]/ [] Home Care/ [] Refused Recommended Disposition /[] [facility]/ [x]  Follow-up with PCP Additional Notes: Please advise pt.  Reason for Disposition  [1] Nasal discharge AND [2] present > 10 days  Answer Assessment - Initial Assessment Questions 1. ONSET: "When did the cough begin?"      Last Thursday 2. SEVERITY: "How bad is the cough today?"      Moderate, worse at night 3. SPUTUM: "Describe the color of your sputum" (none, dry cough; clear, white, yellow, green)     Yellow 4. HEMOPTYSIS: "Are you coughing up any blood?" If so ask: "How much?" (flecks, streaks, tablespoons, etc.)     No 5. DIFFICULTY BREATHING: "Are you having difficulty breathing?" If Yes, ask: "How bad is it?" (e.g., mild, moderate, severe)    - MILD: No SOB at rest, mild SOB with walking, speaks normally in sentences, can lie down, no retractions, pulse < 100.    - MODERATE: SOB at rest, SOB with minimal exertion and prefers to sit, cannot lie down flat, speaks in phrases, mild retractions, audible wheezing, pulse 100-120.    - SEVERE: Very SOB at rest, speaks in single words, struggling to breathe, sitting hunched forward, retractions, pulse > 120      No 6. FEVER: "Do you have a fever?" If Yes, ask: "What is your temperature, how was it measured, and  when did it start?"     No 7. CARDIAC HISTORY: "Do you have any history of heart disease?" (e.g., heart attack, congestive heart failure)      No 8. LUNG HISTORY: "Do you have any history of lung disease?"  (e.g., pulmonary embolus, asthma, emphysema)     No 9. PE RISK FACTORS: "Do you have a history of blood clots?" (or: recent major surgery, recent prolonged travel, bedridden)     No 10. OTHER SYMPTOMS: "Do you have any other symptoms?" (e.g., runny nose, wheezing, chest pain)       Runny nose, wheezing 11. PREGNANCY: "Is there any chance you are pregnant?" "When was your last menstrual period?"       No 12. TRAVEL: "Have you traveled out of the country in the last month?" (e.g., travel history, exposures)       No  Protocols used: Cough - Acute Productive-A-AH

## 2023-09-17 LAB — COVID-19, FLU A+B AND RSV
Influenza A, NAA: NOT DETECTED
Influenza B, NAA: NOT DETECTED
RSV, NAA: NOT DETECTED
SARS-CoV-2, NAA: NOT DETECTED

## 2023-09-20 DIAGNOSIS — M25552 Pain in left hip: Secondary | ICD-10-CM | POA: Diagnosis not present

## 2023-09-20 DIAGNOSIS — Z96652 Presence of left artificial knee joint: Secondary | ICD-10-CM | POA: Diagnosis not present

## 2023-09-20 DIAGNOSIS — M25551 Pain in right hip: Secondary | ICD-10-CM | POA: Diagnosis not present

## 2023-09-21 ENCOUNTER — Ambulatory Visit: Payer: Medicare Other | Attending: Physician Assistant | Admitting: Physician Assistant

## 2023-09-21 ENCOUNTER — Encounter: Payer: Self-pay | Admitting: Physician Assistant

## 2023-09-21 VITALS — BP 132/60 | HR 72 | Ht <= 58 in | Wt 189.0 lb

## 2023-09-21 DIAGNOSIS — I361 Nonrheumatic tricuspid (valve) insufficiency: Secondary | ICD-10-CM | POA: Diagnosis not present

## 2023-09-21 DIAGNOSIS — I4819 Other persistent atrial fibrillation: Secondary | ICD-10-CM | POA: Diagnosis not present

## 2023-09-21 DIAGNOSIS — I272 Pulmonary hypertension, unspecified: Secondary | ICD-10-CM | POA: Diagnosis not present

## 2023-09-21 DIAGNOSIS — I5033 Acute on chronic diastolic (congestive) heart failure: Secondary | ICD-10-CM | POA: Diagnosis not present

## 2023-09-21 NOTE — Assessment & Plan Note (Signed)
Permanent atrial fibrillation. Device interrogation noted AFib recently. I reviewed her prior EKGs. She has been in constant AFib for a couple of years now. Rate is controlled.  -Continue Coumadin as directed -BMET, CBC today

## 2023-09-21 NOTE — Assessment & Plan Note (Signed)
Mod by echocardiogram in 02/2023. Group 2 PH. She has a follow up echocardiogram planned in 02/2024.

## 2023-09-21 NOTE — Assessment & Plan Note (Addendum)
Volume status has improved since increasing Torsemide to 40 mg twice daily. Her home wt is close to her baseline. -Continue Torsemide 40 mg twice daily  -Continue Spironolactone 12.5 mg once daily  -Continue Farxiga 10 mg once daily  -BMET today -Keep follow up for echocardiogram in 02/2024 and Dr. Izora Ribas in 06/2024.

## 2023-09-21 NOTE — Assessment & Plan Note (Signed)
Mod to severe TR. She is not having symptoms to warrant TEE and RHC. We discussed the symptoms that would prompt this. Keep follow up for echocardiogram in 02/2024 and Dr. Izora Ribas in 06/2024.

## 2023-09-21 NOTE — Progress Notes (Signed)
Cardiology Office Note:    Date:  09/21/2023  ID:  Tracey Todd, DOB [birth year], MRN [id number] PCP: Rema Fendt, NP  [facility] Providers Cardiologist:  Christell Constant, MD Electrophysiologist:  Regan Lemming, MD       Patient Profile:      Coronary artery disease LHC 10/23/2014: Minimal nonobstructive CAD Persistent atrial fibrillation/flutter Sick sinus syndrome s/p pacemaker (HFpEF) heart failure with preserved ejection fraction  TTE 08/06/2022: EF 60-65, severe TR, RVSP 56.5 TTE 03/11/2023: EF 60-65, no RWMA, mild LVH, GR 1 DD, normal RVSF, moderate pulmonary hypertension, RVSP 45.2, moderate RAE, mild MR, moderate to severe TR, trivial AI, RAP 3 Pulmonary hypertension, Group 2 Tricuspid valve regurgitation 2/2 pulm HTN, pacer lead Hypertension Hyperlipidemia Diabetes mellitus Chronic kidney disease Obesity         History of Present Illness:  Discussed the use of AI scribe software for clinical note transcription with the patient, who gave verbal consent to proceed.  Tracey Todd is a 89 y.o. female who returns for follow up of increased edema. She was last seen by Dr. Izora Ribas in 07/2023. Repeat TTE is planned in 02/2024. Pt is to be considered for RHC and TEE, TriClip if she develops NYHA III symptoms. She called in recently with abdominal bloating, lower ext edema and shortness of breath. Torsemide was being taken once daily and this was increased to twice daily. She is here with her daughter. She notes her pacemaker was recently interrogated and she was instructed there was a new finding. I did review this today. Her interrogation showed continuous AFib with rate control. Upon further review of her chart, it is noted that she has been in permanent AFib for a couple of years. The patient reported no significant change in breathing but noted occasional palpitations when moving quickly. The patient's weight had been increasing, reaching 188  pounds, above the recommended threshold of 185 pounds. In response, the patient had been taking two doses of torsemide in the morning and evening for the past couple of weeks. She has not had chest discomfort, passing out, or bleeding. The patient denies any sudden waking due to breathlessness and is able to sleep flat on one pillow. The patient had recently started taking prednisone for an upper respiratory infection, which could have contributed to the fluid retention. The patient was due to finish the prednisone course soon.     Review of Systems  Gastrointestinal:  Negative for hematochezia and melena.  Genitourinary:  Negative for hematuria.  -See HPI    Studies Reviewed:       Recent Labs    03/11/23 0904  LDLCALC 68          Risk Assessment/Calculations:    CHA2DS2-VASc Score = 7   This indicates a 11.2% annual risk of stroke. The patient's score is based upon: CHF History: 1 HTN History: 1 Diabetes History: 1 Stroke History: 0 Vascular Disease History: 1 Age Score: 2 Gender Score: 1            Physical Exam:   VS:  BP 132/60   Pulse 72   Ht 4\' 10"  (1.473 m)   Wt 189 lb (85.7 kg)   SpO2 94%   BMI 39.50 kg/m    Wt Readings from Last 3 Encounters:  09/21/23 189 lb (85.7 kg)  09/16/23 190 lb 3.2 oz (86.3 kg)  09/13/23 186 lb 11.7 oz (84.7 kg)    Constitutional:  Appearance: Healthy appearance. Not in distress.  Neck:     Vascular: No JVR.  Pulmonary:     Breath sounds: Normal breath sounds. No wheezing. No rales.  Cardiovascular:     Normal rate. Regular rhythm.     Murmurs: There is no murmur.  Edema:    Peripheral edema present.    Pretibial: bilateral trace edema of the pretibial area. Abdominal:     Palpations: Abdomen is soft.        Assessment and Plan:   Assessment & Plan Acute on chronic heart failure with preserved ejection fraction (HCC) Volume status has improved since increasing Torsemide to 40 mg twice daily. Her home wt is close  to her baseline. -Continue Torsemide 40 mg twice daily  -Continue Spironolactone 12.5 mg once daily  -Continue Farxiga 10 mg once daily  -BMET today -Keep follow up for echocardiogram in 02/2024 and Dr. Izora Ribas in 06/2024. Persistent atrial fibrillation (HCC) Permanent atrial fibrillation. Device interrogation noted AFib recently. I reviewed her prior EKGs. She has been in constant AFib for a couple of years now. Rate is controlled.  -Continue Coumadin as directed -BMET, CBC today Pulmonary hypertension, unspecified (HCC) Mod by echocardiogram in 02/2023. Group 2 PH. She has a follow up echocardiogram planned in 02/2024.  Nonrheumatic tricuspid valve regurgitation Mod to severe TR. She is not having symptoms to warrant TEE and RHC. We discussed the symptoms that would prompt this. Keep follow up for echocardiogram in 02/2024 and Dr. Izora Ribas in 06/2024.        Dispo:  Return in about 10 months (around 07/21/2024) for Routine Follow Up.  Signed, Tereso Newcomer, PA-C

## 2023-09-21 NOTE — Patient Instructions (Signed)
Medication Instructions:  Your physician recommends that you continue on your current medications as directed. Please refer to the Current Medication list given to you today.    *If you need a refill on your cardiac medications before your next appointment, please call your pharmacy*   Lab Work: BMET  CBC    If you have labs (blood work) drawn today and your tests are completely normal, you will receive your results only by: MyChart Message (if you have MyChart) OR A paper copy in the mail If you have any lab test that is abnormal or we need to change your treatment, we will call you to review the results.   Testing/Procedures: Keep ECHO appointment on 02/28/2024 at 7:20am    Follow-Up: At [facility], you and your health needs are our priority.  As part of our continuing mission to provide you with exceptional heart care, we have created designated Provider Care Teams.  These Care Teams include your primary Cardiologist (physician) and Advanced Practice Providers (APPs -  Physician Assistants and Nurse Practitioners) who all work together to provide you with the care you need, when you need it.  We recommend signing up for the patient portal called "MyChart".  Sign up information is provided on this After Visit Summary.  MyChart is used to connect with patients for Virtual Visits (Telemedicine).  Patients are able to view lab/test results, encounter notes, upcoming appointments, etc.  Non-urgent messages can be sent to your provider as well.   To learn more about what you can do with MyChart, go to [email or web address].    Your next appointment:   1 year(s)  The format for your next appointment:   In Person  Provider:   Christell Constant, MD    Other Instructions

## 2023-09-22 ENCOUNTER — Telehealth: Payer: Self-pay

## 2023-09-22 DIAGNOSIS — Z79899 Other long term (current) drug therapy: Secondary | ICD-10-CM

## 2023-09-22 DIAGNOSIS — I5033 Acute on chronic diastolic (congestive) heart failure: Secondary | ICD-10-CM

## 2023-09-22 LAB — CBC
Hematocrit: 45.6 % (ref 34.0–46.6)
Hemoglobin: 14.5 g/dL (ref 11.1–15.9)
MCH: 27.5 pg (ref 26.6–33.0)
MCHC: 31.8 g/dL (ref 31.5–35.7)
MCV: 87 fL (ref 79–97)
Platelets: 299 10*3/uL (ref 150–450)
RBC: 5.27 x10E6/uL (ref 3.77–5.28)
RDW: 15.3 % (ref 11.7–15.4)
WBC: 7.9 10*3/uL (ref 3.4–10.8)

## 2023-09-22 LAB — BASIC METABOLIC PANEL
BUN/Creatinine Ratio: 36 — ABNORMAL HIGH (ref 12–28)
BUN: 40 mg/dL — ABNORMAL HIGH (ref 8–27)
CO2: 27 mmol/L (ref 20–29)
Calcium: 9.5 mg/dL (ref 8.7–10.3)
Chloride: 96 mmol/L (ref 96–106)
Creatinine, Ser: 1.12 mg/dL — ABNORMAL HIGH (ref 0.57–1.00)
Glucose: 281 mg/dL — ABNORMAL HIGH (ref 70–99)
Potassium: 4.2 mmol/L (ref 3.5–5.2)
Sodium: 140 mmol/L (ref 134–144)
eGFR: 48 mL/min/{1.73_m2} — ABNORMAL LOW (ref >59)

## 2023-09-22 MED ORDER — TORSEMIDE 20 MG PO TABS: ORAL_TABLET | ORAL | Status: DC

## 2023-09-22 NOTE — Telephone Encounter (Signed)
Call to patient to schedule follow-up A1c appointment. Patient asked if I would call back tomorrow to schedule . She needs to speak with her daughter as she is the one that gets her to her appointments.

## 2023-09-22 NOTE — Telephone Encounter (Signed)
-----   Message from [place] sent at 09/22/2023  8:45 AM EST ----- Creatinine (kidney function) increased.  Potassium, hemoglobin (blood count) normal. Current diuretics include: Spironolactone 12.5 mg daily, torsemide 40 mg twice daily PLAN:  -Decrease Torsemide to:  - Torsemide 40 mg in the a.m. and 20 mg in the p.m. on Monday, Wednesday, Friday only  - Torsemide 40 mg once daily on all other days  - BMET 1 week Tracey Newcomer, PA-C    09/22/2023 8:41 AM

## 2023-09-22 NOTE — Telephone Encounter (Signed)
Patient aware of results. Placed order for BMET and updated medication list.

## 2023-09-26 ENCOUNTER — Telehealth (INDEPENDENT_AMBULATORY_CARE_PROVIDER_SITE_OTHER): Payer: Self-pay | Admitting: Otolaryngology

## 2023-09-26 NOTE — Telephone Encounter (Signed)
confirmed appt & location [phone] afm

## 2023-09-27 ENCOUNTER — Encounter (INDEPENDENT_AMBULATORY_CARE_PROVIDER_SITE_OTHER): Payer: Self-pay

## 2023-09-27 ENCOUNTER — Ambulatory Visit (INDEPENDENT_AMBULATORY_CARE_PROVIDER_SITE_OTHER): Payer: Medicare Other | Admitting: Otolaryngology

## 2023-09-27 ENCOUNTER — Ambulatory Visit (INDEPENDENT_AMBULATORY_CARE_PROVIDER_SITE_OTHER): Payer: Medicare Other | Admitting: Audiology

## 2023-09-27 VITALS — BP 108/66 | HR 64 | Ht <= 58 in | Wt 189.0 lb

## 2023-09-27 DIAGNOSIS — H906 Mixed conductive and sensorineural hearing loss, bilateral: Secondary | ICD-10-CM

## 2023-09-27 DIAGNOSIS — H6503 Acute serous otitis media, bilateral: Secondary | ICD-10-CM | POA: Diagnosis not present

## 2023-09-27 DIAGNOSIS — H6121 Impacted cerumen, right ear: Secondary | ICD-10-CM

## 2023-09-27 DIAGNOSIS — H6993 Unspecified Eustachian tube disorder, bilateral: Secondary | ICD-10-CM

## 2023-09-27 MED ORDER — FLUTICASONE PROPIONATE 50 MCG/ACT NA SUSP: 2.0000 | Freq: Two times a day (BID) | NASAL | 6 refills | Status: AC

## 2023-09-27 NOTE — Progress Notes (Signed)
Dear Dr. Zonia Kief, Here is my assessment for our mutual patient, Tracey Todd. Thank you for allowing me the opportunity to care for your patient. Please do not hesitate to contact me should you have any other questions. Sincerely, Dr. Jovita Kussmaul  Otolaryngology Clinic Note Referring provider: Dr. Zonia Kief HPI:  Tracey Todd is a 89 y.o. female kindly referred by Dr. Zonia Kief for evaluation of hearing loss  Initial visit (08/2023): Patient reports: noted multiple year h/o declining hearing (at least a year); slowly declining; she does report some bilateral ear pressure, but only going on for about a week to 10 days when she had a URI (cough/congestion) when had some ear discomfort. Does have cerumen impactions regularly which need to be cleaned. Otherwise no significant ear history or complaints. Denies significant ear history. She was recently treated with levaquin and just finished a pred taper for the URI. Patient currently denies: ear pain, fullness, vertigo, drainage, tinnitus Patient additionally denies: deep pain in ear canal, eustachian tube symptoms such as popping, crackling Patient also denies barotrauma, vestibular suppressant use, ototoxic medication use Prior ear surgery: denies  She denies frequent sinus infections or sinonasal issues. No typical AR symptoms. Does not use nasal sprays  H&N Surgery: T&A Personal or FHx of bleeding dz or anesthesia difficulty: no   GLP-1: no AP/AC: Warfarin  Tobacco: rare smoker, [id number]; no significant pack year. Lives in [place]  PMHx: CHF, A-fib, Pulm HTN, TVR, Sick Sinus syndrome, HLD, DM; has a pacemaker  Independent Review of Additional Tests or Records:  Amy Zonia Kief (05/25/2023): Noted decreased hearing in right ear for many months, wax build up, no other symptoms; noted impacted cerumen, ear lavage done; Dx: decreased hearing, impacted cerumen; Rx: ref ENT Amy Zonia Kief (08/15/2023): Noted hearing difficulty both ears; Dx: Hearing  Loss; Rx: Ref ENT ED visit (09/13/2023) Tracey Todd - noted nasal congestion and cough x1 week; noted TM retracted; Dx: URI, Rx: Levaquin, supportive care Ricky Stabs, NP 09/16/2023: persistent cough after URI; Rx: Tessalon, Pred taper. CT Head 06/08/2017 independently interpreted with attention to ears: cuts thick so not optimal but mastoids, ME well aerated; no significant OC or otic capsule abnormalities 08/2023 Audiogram was independently reviewed and interpreted by me and it reveals B/B tymps; mixed HL low frequency then then SNHL primarily in higher frequencies; no significant asymmetry except for mild at 4000-8000 Hz   SNHL= Sensorineural hearing loss  PMH/Meds/All/SocHx/FamHx/ROS:   Past Medical History:  Diagnosis Date   Atrial flutter (HCC)    a. mentioned in 2016 admission.   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 minimal by cath 2016.   Nodule of chest wall    Normal coronary arteries and LVF 10/23/14 10/24/2014   Pacemaker implanted 10/25/14- [employer] Jude 10/26/2014   Paroxysmal atrial fibrillation (HCC)    a. identified on device interrogation, burden low   Pulmonary hypertension (HCC)    Sick sinus syndrome (HCC)    a. s/p STJ dual chamber PPM    Type II or unspecified type diabetes mellitus without mention of complication, not stated as uncontrolled    Vitamin D deficiency      Past Surgical History:  Procedure Laterality Date   BREAST SURGERY     CATARACT EXTRACTION     LEFT AND RIGHT HEART CATHETERIZATION  WITH CORONARY ANGIOGRAM N/A 10/23/2014   Procedure: LEFT AND RIGHT HEART CATHETERIZATION WITH CORONARY ANGIOGRAM;  Surgeon: Micheline Chapman, MD;  Location[facility] C[facility] LAB;  Service: Cardiovascular;  Laterality: N/A;   PERMANENT PACEMAKER INSERTION N/A 10/25/2014   STJ dual chamber PPM implanted by Dr  [name] for SSS   TONSILLECTOMY AND ADENOIDECTOMY     TOTAL KNEE ARTHROPLASTY      Family History  Problem Relation Age of Onset   Diabetes Mother    Cancer Mother    Cancer Father    Breast cancer Neg Hx      Social Connections: Moderately Integrated (05/28/2021)   Social Connection and Isolation Panel [NHANES]    Frequency of Communication with Friends and Family: More than three times a week    Frequency of Social Gatherings with Friends and Family: More than three times a week    Attends Religious Services: More than 4 times per year    Active Member of [employer] or Organizations: No    Attends Engineer, structural: Never    Marital Status: Married      Current Outpatient Medications:    amLODipine (NORVASC) 5 MG tablet, Take 1 tablet by mouth once daily, Disp: 90 tablet, Rfl: 0   atorvastatin (LIPITOR) 80 MG tablet, Take 1 tablet (80 mg total) by mouth daily., Disp: 90 tablet, Rfl: 3   benzonatate (TESSALON PERLES) 100 MG capsule, Take 1 capsule (100 mg total) by mouth 3 (three) times daily as needed., Disp: 30 capsule, Rfl: 1   bisoprolol (ZEBETA) 10 MG tablet, Take 1 tablet by mouth once daily for blood pressure, Disp: 90 tablet, Rfl: 0   Blood Glucose Monitoring Suppl (ONETOUCH VERIO) w/Device KIT, Use to check fasting blood sugar daily. Dx: E11.59, Disp: 1 kit, Rfl: 0   brimonidine (ALPHAGAN) 0.2 % ophthalmic solution, Place 1 drop into both eyes 2 (two) times daily., Disp: , Rfl:    calcium carbonate (OS-CAL) 600 MG TABS tablet, Take 600 mg by mouth daily with breakfast., Disp: , Rfl:    cetirizine (ZYRTEC) 10 MG chewable tablet, as needed for allergies., Disp: , Rfl:    Cholecalciferol (VITAMIN D) 2000 UNITS CAPS, Take 6,000 Units by mouth daily. , Disp: , Rfl:    dapagliflozin propanediol (FARXIGA) 10 MG TABS tablet, Take 1 tablet (10 mg total) by mouth daily before breakfast., Disp: 90 tablet, Rfl: 3   fluticasone (FLONASE) 50 MCG/ACT nasal spray, Place 2 sprays  into both nostrils 2 (two) times daily., Disp: 16 g, Rfl: 6   gabapentin (NEURONTIN) 300 MG capsule, Take 1 capsule (300 mg total) by mouth 2 (two) times daily., Disp: 180 capsule, Rfl: 3   glucose blood (ONETOUCH VERIO) test strip, USE TO CHECK FASTING BLOOD SUGAR TWICE DAILY, Disp: 100 each, Rfl: 11   insulin glargine (LANTUS SOLOSTAR) 100 UNIT/ML Solostar Pen, Inject 12 Units into the skin at bedtime., Disp: , Rfl:    Lancets (ONETOUCH DELICA PLUS LANCET33G) MISC, USE TO CHECK FASTING BLOOD SUGAR TWICE DAILY, Disp: 100 each, Rfl: 2   Magnesium 500 MG TABS, Take 1,000 mg by mouth in the morning and at bedtime. 2 tabs am, 2 tab pm, Disp: , Rfl:    MM PEN NEEDLES 32G X 4 MM MISC, , Disp: , Rfl:    Semaglutide,0.25 or 0.5MG /DOS, (OZEMPIC, 0.25 OR 0.5 MG/DOSE,) 2 MG/3ML SOPN, Inject 0.5 mg into the skin once a week., Disp: , Rfl:    spironolactone (ALDACTONE) 25 MG tablet, Take  1/2 (one-half) tablet by mouth once daily, Disp: 45 tablet, Rfl: 1   torsemide (DEMADEX) 20 MG tablet, Take 2 tablets (40 mg) by mouth daily in the mornings, Take 1 tablet (20 mg) by mouth on Monday, Wednesday, and Friday in the afternoon., Disp: , Rfl:    triamcinolone (KENALOG) 0.025 % cream, Apply 1 Application topically 2 (two) times daily., Disp: 60 g, Rfl: 0   warfarin (COUMADIN) 5 MG tablet, TAKE 1 & 1/2 (ONE & ONE-HALF) TABLETS BY MOUTH ONCE DAILY AS DIRECTED BY  ANTICOAGULATION  CLINIC, Disp: 40 tablet, Rfl: 3   Physical Exam:   BP 108/66 (BP Location: Right Arm, Patient Position: Sitting, Cuff Size: Normal)   Pulse 64   Ht 4\' 10"  (1.473 m)   Wt 189 lb (85.7 kg)   SpO2 93%   BMI 39.50 kg/m   Salient findings:  CN II-XII intact Given history and complaints, ear microscopy was indicated and performed for evaluation with findings as below in physical exam section and in procedures; right ear cerumen impaction; after clearance, noted Bilateral EAC clear and TM intact with b/l air fluid levels and serous  effusions Weber 512: mid Rinne 512: AC > BC b/l  Anterior rhinoscopy: Septum intact; bilateral inferior turbinates with mild hypertrophy and mucoid secretions No lesions of oral cavity No obviously palpable neck masses No respiratory distress or stridor  Seprately Identifiable Procedures:  Procedure: Bilateral ear microscopy and cerumen removal using microscope (CPT 16109) - Mod 25 Pre-procedure diagnosis: Cerumen impaction right external ears; conductive hearing loss bilateral Post-procedure diagnosis: same Indication: see above; given patient's otologic complaints and history as well as for improved and comprehensive examination of external ear and tympanic membrane, bilateral otologic examination using microscope was performed and impacted cerumen removed  Procedure: Patient was placed semi-recumbent. Both ear canals were examined using the microscope with findings above. Impacted Cerumen removed on right using suction and currette with improvement in EAC examination and patency. See above for findings afterwards Patient tolerated the procedure well.      Impression & Plans:  Yosselyn Tax is a 89 y.o. female with:  1. Mixed hearing loss, bilateral   2. ETD (Eustachian tube dysfunction), bilateral   3. Impacted cerumen of right ear   4. Non-recurrent acute serous otitis media of both ears    Hearing loss ongoing for at least 1 year, b/l, but recently had URI for which she had some ear pressure and discomfort. Had right cerumen impaction, cleared, and now with b/l serous effusions. These likely account for her CHL As such, discussed options and patient agreed to proceed with:  - Flonase two sprays BID x6 weeks - autoinsufflate ears - f/u in 3 months; she will contact her insurance company to see where HA covered, happy to refer her to that place for amplification - call sooner if any other issues  See below regarding exact medications prescribed this encounter including  dosages and route: Meds ordered this encounter  Medications   fluticasone (FLONASE) 50 MCG/ACT nasal spray    Sig: Place 2 sprays into both nostrils 2 (two) times daily.    Dispense:  16 g    Refill:  6      Thank you for allowing me the opportunity to care for your patient. Please do not hesitate to contact me should you have any other questions.  Sincerely, Jovita Kussmaul, MD Otolaryngologist (ENT), [facility] Phone: [phone] Fax: [phone]  09/27/2023, 8:10 PM   MDM:  Level  4 Complexity/Problems addressed: mod - chronic problem, and acute problem Data complexity: mod - independent review of notes, labs; ordering audio/test; independent review and interpretation of imaging (CT) - Morbidity: mod  - Prescription Drug prescribed or managed: yes

## 2023-09-27 NOTE — Patient Instructions (Signed)
Use flonase two sprays each nostril twice daily for 8 weeks Follow up with dr. Allena Katz in 3 months

## 2023-09-27 NOTE — Progress Notes (Signed)
  [address] [place], [place] [address] [phone]  Audiological Evaluation    Name: Tracey Todd     DOB:   17-Feb-1935      MRN:   [id number]                                                                                     Service Date: 09/27/2023     Accompanied by: daughter   Patient comes today after Dr. Allena Katz, ENT sent a referral for a hearing evaluation due to concerns with hearing loss.   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 Left ear:  Clear external ear canals and notable landmarks visualized on the tympanic membrane.  Tympanometry: Right ear: Type B- Normal external ear canal volume with no middle ear pressure and tympanic membrane compliance Left ear: Type B- Normal external ear canal volume with no middle ear pressure and tympanic membrane compliance    Pure tone Audiometry: Right ear- Mild to moderate mixed hearing loss from 250 Hz - 8000 Hz. Left ear-  Mild to moderate mixed hearing loss from 250 Hz - 8000 Hz.  The hearing test results were completed under headphones and results are deemed to be of good reliability. Test technique:  conventional     Speech Audiometry: Right ear- Speech Reception Threshold (SRT) was obtained at 35 dBHL Left ear-Speech Reception Threshold (SRT) was obtained at 35 dBHL   Word Recognition Score Tested using NU-6 (MLV) Right ear: 96% was obtained at a presentation level of 75 dBHL with contralateral masking which is deemed as  excellent Left ear: 96% was obtained at a presentation level of 75 dBHL with contralateral masking which is deemed as  excellent    Recommendations: Follow up with ENT as scheduled for today., Repeat audiogram after medical care.   [name] [name], AUD

## 2023-09-30 DIAGNOSIS — I5033 Acute on chronic diastolic (congestive) heart failure: Secondary | ICD-10-CM | POA: Diagnosis not present

## 2023-09-30 DIAGNOSIS — Z79899 Other long term (current) drug therapy: Secondary | ICD-10-CM | POA: Diagnosis not present

## 2023-09-30 NOTE — Progress Notes (Signed)
 Remote pacemaker transmission.

## 2023-10-01 DIAGNOSIS — E119 Type 2 diabetes mellitus without complications: Secondary | ICD-10-CM | POA: Diagnosis not present

## 2023-10-01 LAB — BASIC METABOLIC PANEL
BUN/Creatinine Ratio: 27 (ref 12–28)
BUN: 31 mg/dL — ABNORMAL HIGH (ref 8–27)
CO2: 28 mmol/L (ref 20–29)
Calcium: 9.4 mg/dL (ref 8.7–10.3)
Chloride: 97 mmol/L (ref 96–106)
Creatinine, Ser: 1.13 mg/dL — ABNORMAL HIGH (ref 0.57–1.00)
Glucose: 101 mg/dL — ABNORMAL HIGH (ref 70–99)
Potassium: 4.8 mmol/L (ref 3.5–5.2)
Sodium: 141 mmol/L (ref 134–144)
eGFR: 47 mL/min/{1.73_m2} — ABNORMAL LOW (ref >59)

## 2023-10-03 NOTE — Progress Notes (Signed)
 Pt has been made aware of normal result and verbalized understanding.  jw

## 2023-10-04 ENCOUNTER — Ambulatory Visit: Payer: Medicare Other | Attending: Cardiology

## 2023-10-04 DIAGNOSIS — I480 Paroxysmal atrial fibrillation: Secondary | ICD-10-CM

## 2023-10-04 DIAGNOSIS — Z7901 Long term (current) use of anticoagulants: Secondary | ICD-10-CM | POA: Diagnosis not present

## 2023-10-04 LAB — POCT INR: INR: 3.6 — AB (ref 2.0–3.0)

## 2023-10-04 NOTE — Patient Instructions (Signed)
Hold tonight only then  Continue taking warfarin 1 TABLET DAILY, EXCEPT 1.5 TABLETS ON THURSDAY.  Continue eating 2 serving of greens per week plus your one day of brussels sprouts. Recheck INR in 3 weeks. Coumadin Clinic [phone] or [phone]

## 2023-10-10 ENCOUNTER — Ambulatory Visit: Payer: Medicare Other | Admitting: Student

## 2023-10-10 DIAGNOSIS — M25551 Pain in right hip: Secondary | ICD-10-CM | POA: Diagnosis not present

## 2023-10-11 ENCOUNTER — Telehealth: Payer: Self-pay | Admitting: *Deleted

## 2023-10-11 NOTE — Telephone Encounter (Signed)
Pt called and stated she will have a cortisone shot on 10/24/23; advised that will be fine and it usually does not interact with warfarin. Advised on Sunday she could have her a normal serving on leafy veggies and keep appt on Tuesday. She verbalized understanding.

## 2023-10-16 NOTE — Progress Notes (Deleted)
 Cardiology Office Note:  .   Date:  10/16/2023  ID:  Tracey Todd, DOB 1935/01/28, MRN [id number] PCP: Tracey Fendt, NP  [facility] Providers Cardiologist:  Tracey Constant, MD Electrophysiologist:  Tracey Jorja Loa, MD {  History of Present Illness: Tracey Todd   Tracey Todd is a 89 y.o. 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 Volume reported as better with the home PRN doing and no changes made Instructed to keep appt in summer for f/u echo, visit with Dr. Izora Todd in 06/2024   For EP Last saw S. Riddle, NP 10/13/22 (Dr. Johney Todd prior)  Today's visit is scheduled as an annual visit  ROS:   *** VVI? R? *** warfarin, bleeding, [place] *** volume/otherwise with cards team *** WC reading *** permanent?   Device information StJM dual chamber PPM implanted    Studies Reviewed: Tracey Todd    EKG done today and reviewed by myself:  ***  DEVICE interrogation done today and reviewed by myself *** Battery and lead measurements are good ***   03/11/23: TTE 1. Left ventricular ejection fraction, by estimation, is 60 to 65%. The  left ventricle has normal function. The left ventricle has no regional  wall motion abnormalities. There is mild concentric left ventricular  hypertrophy. Left ventricular diastolic  parameters are consistent with Grade I diastolic dysfunction (impaired  relaxation).   2. Right ventricular systolic function is normal. The right ventricular  size is mildly enlarged. There is moderately elevated pulmonary artery  systolic pressure. The estimated right ventricular systolic pressure is  45.2 mmHg.   3. Right atrial  size was moderately dilated.   4. The mitral valve is normal in structure. Mild mitral valve  regurgitation. No evidence of mitral stenosis.   5. Tricuspid valve regurgitation is moderate to severe.   6. The aortic valve is tricuspid. There is mild calcification of the  aortic valve. Aortic valve regurgitation is trivial. No aortic stenosis is  present.   7. The inferior vena cava is normal in size with greater than 50%  respiratory variability, suggesting right atrial pressure of 3 mmHg.    TTE 08/06/2022  1. Left ventricular ejection fraction, by estimation, is 60 to 65%. The left ventricle has normal function. Left ventricular endocardial border not optimally defined to evaluate regional wall motion. There is mild left ventricular hypertrophy of the  basal-septal segment. Left ventricular diastolic function could not be evaluated.   2. Right ventricular systolic function is mildly reduced. The right ventricular size is normal. There is moderately elevated pulmonary artery systolic pressure. The estimated right ventricular systolic pressure is 56.5 mmHg.   3. Left atrial size was mildly dilated.   4. Right atrial size was moderately dilated.   5. The mitral valve is normal in structure. Mild mitral valve regurgitation.   6. The tricuspid valve is abnormal. Tricuspid valve regurgitation is severe.   7. The aortic valve is tricuspid. There is mild calcification of the aortic valve. There is mild thickening of the aortic valve. Aortic valve regurgitation is not visualized. Aortic valve sclerosis/calcification is present, without any evidence of  aortic stenosis.      Risk Assessment/Calculations:  Physical Exam:   VS:  There were no vitals taken for this visit.   Wt Readings from Last 3 Encounters:  09/27/23 189 lb (85.7 kg)  09/21/23 189 lb (85.7 kg)  09/16/23 190 lb 3.2 oz (86.3 kg)    GEN: Well nourished, well developed in no acute distress NECK: No JVD; No carotid  bruits CARDIAC: ***RRR, no murmurs, rubs, gallops RESPIRATORY:  *** CTA b/l without rales, wheezing or rhonchi  ABDOMEN: Soft, non-tender, non-distended EXTREMITIES:  No edema; No deformity   PPM site: *** is stable, no thinning, fluctuation, tethering  ASSESSMENT AND PLAN: .    *** permanent  AFib CHA2DS2Vasc is 7, on warfarin, monitored by *** *** rate  PPM *** intact function *** programming changes  Secondary hypercoagulable state 2/2 AFib     {Are you ordering a CV Procedure (e.g. stress test, cath, DCCV, TEE, etc)?   Press F2        :[id number]}     Dispo: ***  Signed, Sheilah Pigeon, PA-C

## 2023-10-19 ENCOUNTER — Ambulatory Visit: Payer: Medicare Other | Admitting: Physician Assistant

## 2023-10-21 NOTE — Progress Notes (Signed)
 Cardiology Office Note:  .   Date:  10/21/2023  ID:  PANDORA MCCRACKIN, DOB 10/25/1934, MRN [id number] PCP: Rema Fendt, NP  [facility] Providers Cardiologist:  Christell Constant, MD Electrophysiologist:  Will Jorja Loa, MD {  History of Present Illness: Marland Kitchen   BLIMIE VANESS is a 89 y.o. 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 Volume reported as better with the home PRN doing and no changes made Instructed to keep appt in summer for f/u echo, visit with Dr. Izora Ribas in 06/2024   For EP Last saw S. Riddle, NP 10/13/22 (Dr. [name] prior)  Today's visit is scheduled as an annual visit  ROS:   She has family with her No CP, palpitations or cardiac awareness No SOB Comes with a walker, denies falls No near syncope or syncope She has a bruise on her thigh uncertain what from, had a nose bleed this morning, easily stopped  INR this AM was 4.3 > coumadin clinic addressed  Device information StJM dual chamber PPM implanted    Studies Reviewed: Marland Kitchen    EKG not done today  DEVICE interrogation done today and reviewed by myself Battery and lead measurements are good She has been in Afib since October  Rate controlled She is dependent   03/11/23: TTE 1. Left ventricular ejection fraction, by estimation, is 60 to 65%. The  left ventricle has normal function. The left ventricle has no regional  wall motion abnormalities. There is mild concentric left ventricular  hypertrophy. Left ventricular diastolic  parameters are consistent with Grade I diastolic dysfunction (impaired  relaxation).   2. Right ventricular systolic function is  normal. The right ventricular  size is mildly enlarged. There is moderately elevated pulmonary artery  systolic pressure. The estimated right ventricular systolic pressure is  45.2 mmHg.   3. Right atrial size was moderately dilated.   4. The mitral valve is normal in structure. Mild mitral valve  regurgitation. No evidence of mitral stenosis.   5. Tricuspid valve regurgitation is moderate to severe.   6. The aortic valve is tricuspid. There is mild calcification of the  aortic valve. Aortic valve regurgitation is trivial. No aortic stenosis is  present.   7. The inferior vena cava is normal in size with greater than 50%  respiratory variability, suggesting right atrial pressure of 3 mmHg.    TTE 08/06/2022  1. Left ventricular ejection fraction, by estimation, is 60 to 65%. The left ventricle has normal function. Left ventricular endocardial border not optimally defined to evaluate regional wall motion. There is mild left ventricular hypertrophy of the  basal-septal segment. Left ventricular diastolic function could not be evaluated.   2. Right ventricular systolic function is mildly reduced. The right ventricular size is normal. There is moderately elevated pulmonary artery systolic pressure. The estimated right ventricular systolic pressure is 56.5 mmHg.   3. Left atrial size was mildly dilated.   4. Right atrial size was moderately dilated.   5. The mitral valve is normal in structure. Mild mitral valve regurgitation.   6. The tricuspid valve is abnormal. Tricuspid valve regurgitation is severe.   7. The aortic valve is tricuspid. There is mild calcification  of the aortic valve. There is mild thickening of the aortic valve. Aortic valve regurgitation is not visualized. Aortic valve sclerosis/calcification is present, without any evidence of  aortic stenosis.      Risk Assessment/Calculations:    Physical Exam:   VS:  There were no vitals taken for this visit.   Wt Readings from  Last 3 Encounters:  09/27/23 189 lb (85.7 kg)  09/21/23 189 lb (85.7 kg)  09/16/23 190 lb 3.2 oz (86.3 kg)    GEN: Well nourished, well developed in no acute distress NECK: No JVD; No carotid bruits CARDIAC: RRR (paced), no murmurs, rubs, gallops RESPIRATORY:  CTA b/l without rales, wheezing or rhonchi  ABDOMEN: Soft, non-tender, non-distended EXTREMITIES: No edema; No deformity  She has a bruise L thigh, soft, no hematoma  PPM site: is stable, no thinning, fluctuation, tethering  ASSESSMENT AND PLAN: .    Persistent  AFib CHA2DS2Vasc is 7, on warfarin, monitored by our team She spent most of the summer in [place] Back in AFib since oct > no symptoms  Discussed VVIR programming She is not interested in rhythm control, medication changes, >> I did not try to pace terminate, but would like her programming left unchanged  PPM intact function I had initially programmed VVIR though the pt ultimately decided to keep her prior setting These were all restored Unable to run autocapture > RA output to 2.5/0.5  Estimate battery 1.2 years  Secondary hypercoagulable state 2/2 AFib     Dispo: back in 32mo, sooner if needed  Signed, Sheilah Pigeon, PA-C

## 2023-10-24 DIAGNOSIS — M25551 Pain in right hip: Secondary | ICD-10-CM | POA: Diagnosis not present

## 2023-10-25 ENCOUNTER — Encounter: Payer: Self-pay | Admitting: Physician Assistant

## 2023-10-25 ENCOUNTER — Ambulatory Visit (INDEPENDENT_AMBULATORY_CARE_PROVIDER_SITE_OTHER): Payer: Medicare Other | Admitting: Physician Assistant

## 2023-10-25 ENCOUNTER — Ambulatory Visit: Payer: Medicare Other | Attending: Cardiology | Admitting: *Deleted

## 2023-10-25 VITALS — BP 128/74 | HR 62 | Ht <= 58 in | Wt 186.4 lb

## 2023-10-25 DIAGNOSIS — I480 Paroxysmal atrial fibrillation: Secondary | ICD-10-CM

## 2023-10-25 DIAGNOSIS — Z7901 Long term (current) use of anticoagulants: Secondary | ICD-10-CM | POA: Diagnosis not present

## 2023-10-25 DIAGNOSIS — D6869 Other thrombophilia: Secondary | ICD-10-CM | POA: Diagnosis not present

## 2023-10-25 DIAGNOSIS — Z950 Presence of cardiac pacemaker: Secondary | ICD-10-CM | POA: Diagnosis not present

## 2023-10-25 DIAGNOSIS — I4819 Other persistent atrial fibrillation: Secondary | ICD-10-CM

## 2023-10-25 LAB — CUP PACEART INCLINIC DEVICE CHECK
Battery Remaining Longevity: 13 mo
Battery Voltage: 2.86 V
Brady Statistic RA Percent Paced: 36 %
Brady Statistic RV Percent Paced: 99.58 %
Date Time Interrogation Session: 20250225180842
Implantable Lead Connection Status: 753985
Implantable Lead Connection Status: 753985
Implantable Lead Implant Date: 20160226
Implantable Lead Implant Date: 20160226
Implantable Lead Location: 753859
Implantable Lead Location: 753860
Implantable Lead Model: 1948
Implantable Pulse Generator Implant Date: 20160226
Lead Channel Impedance Value: 437.5 Ohm
Lead Channel Impedance Value: 575 Ohm
Lead Channel Pacing Threshold Amplitude: 0.5 V
Lead Channel Pacing Threshold Amplitude: 0.5 V
Lead Channel Pacing Threshold Amplitude: 0.625 V
Lead Channel Pacing Threshold Pulse Width: 0.4 ms
Lead Channel Pacing Threshold Pulse Width: 0.4 ms
Lead Channel Pacing Threshold Pulse Width: 0.4 ms
Lead Channel Sensing Intrinsic Amplitude: 1.8 mV
Lead Channel Sensing Intrinsic Amplitude: 10.1 mV
Lead Channel Setting Pacing Amplitude: 2.5 V
Lead Channel Setting Pacing Amplitude: 2.5 V
Lead Channel Setting Pacing Pulse Width: 0.4 ms
Lead Channel Setting Sensing Sensitivity: 2 mV
Pulse Gen Model: 2240
Pulse Gen Serial Number: 7700661

## 2023-10-25 LAB — POCT INR: INR: 4.3 — AB (ref 2.0–3.0)

## 2023-10-25 NOTE — Patient Instructions (Signed)
 Medication Instructions:   Your physician recommends that you continue on your current medications as directed. Please refer to the Current Medication list given to you today.   *If you need a refill on your cardiac medications before your next appointment, please call your pharmacy*   Lab Work:  NONE ORDERED  TODAY    If you have labs (blood work) drawn today and your tests are completely normal, you will receive your results only by: MyChart Message (if you have MyChart) OR A paper copy in the mail If you have any lab test that is abnormal or we need to change your treatment, we will call you to review the results.   Testing/Procedures:  NONE ORDERED  TODAY       Follow-Up: At [facility], you and your health needs are our priority.  As part of our continuing mission to provide you with exceptional heart care, we have created designated Provider Care Teams.  These Care Teams include your primary Cardiologist (physician) and Advanced Practice Providers (APPs -  Physician Assistants and Nurse Practitioners) who all work together to provide you with the care you need, when you need it.  We recommend signing up for the patient portal called "MyChart".  Sign up information is provided on this After Visit Summary.  MyChart is used to connect with patients for Virtual Visits (Telemedicine).  Patients are able to view lab/test results, encounter notes, upcoming appointments, etc.  Non-urgent messages can be sent to your provider as well.   To learn more about what you can do with MyChart, go to [email or web address].    Your next appointment:    6 month(s)  Provider:    You may see Will Jorja Loa, MD or one of the following Advanced Practice Providers on your designated Care Team:   Francis Dowse, [place]   Other Instructions

## 2023-10-25 NOTE — Patient Instructions (Addendum)
 Description   Do not take any warfarin today and take 1/2 tablet of warfarin today then START taking warfarin 1 TABLET DAILY. Continue eating 2 serving of greens per week plus your one day of brussels sprouts. Recheck INR in 3 weeks. Coumadin Clinic [phone] or [phone]

## 2023-10-26 ENCOUNTER — Telehealth: Payer: Self-pay | Admitting: Family

## 2023-10-26 ENCOUNTER — Other Ambulatory Visit: Payer: Self-pay | Admitting: Family

## 2023-10-26 DIAGNOSIS — R2689 Other abnormalities of gait and mobility: Secondary | ICD-10-CM

## 2023-10-26 NOTE — Telephone Encounter (Signed)
 Copied from CRM [id number]. Topic: General - Other >> Oct 26, 2023  9:22 AM Antwanette L wrote: Reason for CRM: Patient is calling because she needs Amy Zonia Kief to write her a prescription for a walker with a foldable seat. Please contact patient by phone at [phone].

## 2023-10-26 NOTE — Telephone Encounter (Signed)
 Complete

## 2023-10-29 DIAGNOSIS — E119 Type 2 diabetes mellitus without complications: Secondary | ICD-10-CM | POA: Diagnosis not present

## 2023-11-02 ENCOUNTER — Telehealth: Payer: Self-pay

## 2023-11-02 MED ORDER — DAPAGLIFLOZIN PROPANEDIOL 10 MG PO TABS: 10.0000 mg | ORAL_TABLET | Freq: Every day | ORAL | 3 refills | Status: DC

## 2023-11-02 NOTE — Telephone Encounter (Signed)
 Refill for Farxiga sent to [facility].

## 2023-11-02 NOTE — Telephone Encounter (Signed)
 Received a fax notice in the [facility] Patient Assistance que requesting a refill for Tracey Todd (see chart media). Please send in prescription refill for Farxiga to [facility] [place], [place].

## 2023-11-03 ENCOUNTER — Encounter: Payer: Self-pay | Admitting: *Deleted

## 2023-11-07 ENCOUNTER — Telehealth: Payer: Self-pay | Admitting: Internal Medicine

## 2023-11-07 ENCOUNTER — Ambulatory Visit: Payer: Self-pay | Admitting: Family

## 2023-11-07 NOTE — Telephone Encounter (Signed)
 Pt calling c/o of vaginal bleeding and bruising on left inner thigh since 2/24 but was seen at coumadin. Nose bleeding as well.

## 2023-11-07 NOTE — Telephone Encounter (Signed)
 Patient given appt 11/08/2023

## 2023-11-07 NOTE — Telephone Encounter (Signed)
 Chief Complaint: bruise Symptoms: bruise to left inner thigh, nose bleedx 3 Frequency: for 2 weeks Pertinent Negatives: Patient denies sob, difficulty breating, pain Disposition: [] ED /[] Urgent Care (no appt availability in office) / [x] Appointment(In office/virtual)/ []  [place] [facility]/ [] Home Care/ [] Refused Recommended Disposition /[]  Mobile Bus/ []  Follow-up with PCP Additional Notes: Patient states that she noticed a small thumbnail sized bruise on her inner left thigh on 10/25/23 and it has gotten bigger now like 4inx1in and she has also had 3 nose bleeds and some vaginal spotting since then.   Copied from CRM [id number]. Topic: Clinical - Red Word Triage >> Nov 07, 2023  9:42 AM Almira Coaster wrote: Red Word that prompted transfer to Nurse Triage: Patient noticed a bruise on her inner left thigh about two weeks ago, since then she states that the bruising has gotten bigger and goes around her leg. She has also developed nose bleeds and does not know if it has anything to do with the bruising. She did not have a direct injury to the inner thigh and it just developed on it's own. Reason for Disposition  [1] Not caused by an injury AND [2] < 5 unexplained bruises  Answer Assessment - Initial Assessment Questions 1. APPEARANCE of BRUISE: "Describe the bruise."      Purple/black 2. SIZE: "How large is the bruise?"      About 4 inches long and an inch wide 3. NUMBER: "How many bruises are there?"      one 4. LOCATION: "Where is the bruise located?"     Left  Inner thigh  5. ONSET: "How long ago did the bruise occur?"      2 weeks 6. CAUSE: "Tell me how it happened."     Can't rememeber 7. MEDICAL HISTORY: "Do you have any medical problems that can cause easy bruising or bleeding?" (e.g., leukemia, liver disease, recent chemotherapy)     no 8. MEDICINES: "Do you take any medications which thin the blood such as: aspirin, heparin, ibuprofen (NSAIDS), Plavix, or Coumadin?"      coumadin 9. OTHER SYMPTOMS: "Do you have any other symptoms?"  (e.g., weakness, dizziness, pain, fever, nosebleed, blood in urine/stool)     Nose bleeds and vaginal spotting  Protocols used: Bruises-A-AH

## 2023-11-07 NOTE — Telephone Encounter (Signed)
 Tracey Todd with [employer] called to report that she spoke with the pt and she is saying that she has been having some intermittent nose bleeds and vaginal bleeding... she says that the pt notified our Coumadin Clinic that she had some thigh bruising but it has grown larger since they talked ith her.   I have advised her that I will send to our INR clinic for review and recommendations and we will call the pt directly.

## 2023-11-07 NOTE — Telephone Encounter (Signed)
 I spoke to patient who called informing us that her bruise on her Left thigh is increasing and she is having nose bleeds, along with vaginal bleeding.  She has a visit with her PCP tomorrow 3/11 and will follow up with [place] afterwards.

## 2023-11-08 ENCOUNTER — Encounter: Payer: Self-pay | Admitting: Nurse Practitioner

## 2023-11-08 ENCOUNTER — Ambulatory Visit: Attending: Nurse Practitioner | Admitting: Nurse Practitioner

## 2023-11-08 VITALS — BP 126/76 | HR 63 | Resp 19 | Ht <= 58 in | Wt 184.0 lb

## 2023-11-08 DIAGNOSIS — T148XXA Other injury of unspecified body region, initial encounter: Secondary | ICD-10-CM

## 2023-11-08 DIAGNOSIS — R040 Epistaxis: Secondary | ICD-10-CM

## 2023-11-08 DIAGNOSIS — N939 Abnormal uterine and vaginal bleeding, unspecified: Secondary | ICD-10-CM

## 2023-11-08 NOTE — Progress Notes (Signed)
 Assessment & Plan:  Tracey Todd was seen today for bleeding/bruising.  Diagnoses and all orders for this visit:  Bleeding from the nose Currently resolved Vaginal bleeding Hematoma  Instructed to follow up with coumadin clinic tomorrow at 230 for INR check   Patient has been counseled on age-appropriate routine health concerns for screening and prevention. These are reviewed and up-to-date. Referrals have been placed accordingly. Immunizations are up-to-date or declined.    Subjective:   Chief Complaint  Patient presents with   Bleeding/Bruising    Tracey Todd 89 y.o. female presents to office today accompanied by her daughter with concerns of epistaxis (now resolved) vaginal spotting and increased bleeding under the skin on the inner left thigh which started 2 weeks ago. Recent INR ib 10-25-2023 was 4.3. Tracey Todd states she was instructed by the coumadin clinic to decrease her coumadin to 1 tablet with follow up next Tuesday for INR.   Due to the significant increased size of her bruising today and last INR not at goal I will have her follow up with the coumadin clinic as soon as possible.  I was able to get her scheduled for tomorrow at 2:30.   Review of Systems  Constitutional:  Negative for fever.  HENT:  Positive for nosebleeds.   Respiratory: Negative.  Negative for hemoptysis and shortness of breath.   Cardiovascular:  Positive for chest pain and leg swelling.  Gastrointestinal:  Negative for blood in stool and melena.  Genitourinary:  Negative for hematuria.       Vaginal spotting  Endo/Heme/Allergies:  Bruises/bleeds easily.    Past Medical History:  Diagnosis Date   Atrial flutter (HCC)    a. mentioned in 2016 admission.   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 minimal by cath 2016.    Nodule of chest wall    Normal coronary arteries and LVF 10/23/14 10/24/2014   Pacemaker implanted 10/25/14- [employer] 10/26/2014   Paroxysmal atrial fibrillation (HCC)    a. identified on device interrogation, burden low   Pulmonary hypertension (HCC)    Sick sinus syndrome (HCC)    a. s/p STJ dual chamber PPM    Type II or unspecified type diabetes mellitus without mention of complication, not stated as uncontrolled    Vitamin D deficiency     Past Surgical History:  Procedure Laterality Date   BREAST SURGERY     CATARACT EXTRACTION     LEFT AND RIGHT HEART CATHETERIZATION WITH CORONARY ANGIOGRAM N/A 10/23/2014   Procedure: LEFT AND RIGHT HEART CATHETERIZATION WITH CORONARY ANGIOGRAM;  Surgeon: Micheline Chapman, MD;  Location: [facility] CATH LAB;  Service: Cardiovascular;  Laterality: N/A;   PERMANENT PACEMAKER INSERTION N/A 10/25/2014   STJ dual chamber PPM implanted by Dr [name] for SSS   TONSILLECTOMY AND ADENOIDECTOMY     TOTAL KNEE ARTHROPLASTY      Family History  Problem Relation Age of Onset   Diabetes Mother    Cancer Mother    Cancer Father    Breast cancer Neg Hx     Social History Reviewed with no changes to be made today.   Outpatient Medications Prior to Visit  Medication Sig Dispense Refill   amLODipine (NORVASC) 5 MG tablet Take 1 tablet by mouth once daily 90 tablet  0   atorvastatin (LIPITOR) 80 MG tablet Take 1 tablet (80 mg total) by mouth daily. 90 tablet 3   bisoprolol (ZEBETA) 10 MG tablet Take 1 tablet by mouth once daily for blood pressure 90 tablet 0   Blood Glucose Monitoring Suppl (ONETOUCH VERIO) w/Device KIT Use to check fasting blood sugar daily. Dx: E11.59 1 kit 0   brimonidine (ALPHAGAN) 0.2 % ophthalmic solution Place 1 drop into both eyes 2 (two) times daily.     calcium carbonate (OS-CAL) 600 MG TABS tablet Take 600 mg by mouth daily with breakfast.     cetirizine (ZYRTEC) 10 MG chewable tablet as needed for allergies.     Cholecalciferol (VITAMIN D)  2000 UNITS CAPS Take 6,000 Units by mouth daily.      dapagliflozin propanediol (FARXIGA) 10 MG TABS tablet Take 1 tablet (10 mg total) by mouth daily before breakfast. 90 tablet 3   fluticasone (FLONASE) 50 MCG/ACT nasal spray Place 2 sprays into both nostrils 2 (two) times daily. 16 g 6   gabapentin (NEURONTIN) 300 MG capsule Take 1 capsule (300 mg total) by mouth 2 (two) times daily. 180 capsule 3   glucose blood (ONETOUCH VERIO) test strip USE TO CHECK FASTING BLOOD SUGAR TWICE DAILY 100 each 11   insulin glargine (LANTUS SOLOSTAR) 100 UNIT/ML Solostar Pen Inject 12 Units into the skin at bedtime.     Lancets (ONETOUCH DELICA PLUS LANCET33G) MISC USE TO CHECK FASTING BLOOD SUGAR TWICE DAILY 100 each 2   Magnesium 500 MG TABS Take 1,000 mg by mouth in the morning and at bedtime. 2 tabs am, 2 tab pm     MM PEN NEEDLES 32G X 4 MM MISC      Semaglutide,0.25 or 0.5MG /DOS, (OZEMPIC, 0.25 OR 0.5 MG/DOSE,) 2 MG/3ML SOPN Inject 0.5 mg into the skin once a week.     spironolactone (ALDACTONE) 25 MG tablet Take 1/2 (one-half) tablet by mouth once daily 45 tablet 1   torsemide (DEMADEX) 20 MG tablet Take 2 tablets (40 mg) by mouth daily in the mornings, Take 1 tablet (20 mg) by mouth on Monday, Wednesday, and Friday in the afternoon.     triamcinolone (KENALOG) 0.025 % cream Apply 1 Application topically 2 (two) times daily. 60 g 0   warfarin (COUMADIN) 5 MG tablet TAKE 1 & 1/2 (ONE & ONE-HALF) TABLETS BY MOUTH ONCE DAILY AS DIRECTED BY  ANTICOAGULATION  CLINIC 40 tablet 3   No facility-administered medications prior to visit.    Allergies  Allergen Reactions   Penicillin G Anaphylaxis   Penicillins Anaphylaxis   Quinapril Anaphylaxis    Angioedema   Other     Other Reaction(s): irritation of skin   Piroxicam Other (See Comments)    Bleeding  Other Reaction(s): blood in stool   Calcium-Containing Compounds Other (See Comments)    "calcium deposits on skin"   Celecoxib Itching   Oxaprozin  Itching and Other (See Comments)       Objective:    BP 126/76 (BP Location: Left Arm, Patient Position: Sitting, Cuff Size: Normal)   Pulse 63   Resp 19   Ht 4\' 10"  (1.473 m)   Wt 184 lb (83.5 kg)   SpO2 95%   BMI 38.46 kg/m  Wt Readings from Last 3 Encounters:  11/08/23 184 lb (83.5 kg)  10/25/23 186 lb 6.4 oz (84.6 kg)  09/27/23 189 lb (85.7 kg)    Physical Exam Vitals and nursing note reviewed.  Constitutional:  Appearance: She is well-developed.  HENT:     Head: Normocephalic and atraumatic.  Cardiovascular:     Rate and Rhythm: Normal rate and regular rhythm.     Heart sounds: Normal heart sounds. No murmur heard.    No friction rub. No gallop.  Pulmonary:     Effort: Pulmonary effort is normal. No tachypnea or respiratory distress.     Breath sounds: Normal breath sounds. No decreased breath sounds, wheezing, rhonchi or rales.  Chest:     Chest wall: No tenderness.  Abdominal:     General: Bowel sounds are normal.     Palpations: Abdomen is soft.  Musculoskeletal:        General: Normal range of motion.     Cervical back: Normal range of motion.       Legs:  Skin:    General: Skin is warm and dry.  Neurological:     Mental Status: She is alert and oriented to person, place, and time.     Coordination: Coordination normal.  Psychiatric:        Behavior: Behavior normal. Behavior is cooperative.        Thought Content: Thought content normal.        Judgment: Judgment normal.          Patient has been counseled extensively about nutrition and exercise as well as the importance of adherence with medications and regular follow-up. The patient was given clear instructions to go to ER or return to medical center if symptoms don't improve, worsen or new problems develop. The patient verbalized understanding.   Follow-up: Return for F/U with PCP.   Claiborne Rigg, FNP-BC [facility] [place], [place] [phone]    11/08/2023, 4:47 PM

## 2023-11-09 ENCOUNTER — Ambulatory Visit: Attending: Cardiovascular Disease

## 2023-11-09 DIAGNOSIS — I480 Paroxysmal atrial fibrillation: Secondary | ICD-10-CM | POA: Diagnosis not present

## 2023-11-09 DIAGNOSIS — Z7901 Long term (current) use of anticoagulants: Secondary | ICD-10-CM

## 2023-11-09 LAB — POCT INR: INR: 4.3 — AB (ref 2.0–3.0)

## 2023-11-09 NOTE — Patient Instructions (Signed)
 Description   HOLD today's dose and tomorrow's dose and then START taking warfarin 1 TABLET DAILY EXCEPT 1/2 TABLET ON SUNDAYS.  Continue eating 2 serving of greens per week plus your one day of brussels sprouts.  Recheck INR in 1 week.  Coumadin Clinic [phone]

## 2023-11-14 ENCOUNTER — Other Ambulatory Visit: Payer: Self-pay

## 2023-11-14 ENCOUNTER — Emergency Department (HOSPITAL_COMMUNITY)
Admission: EM | Admit: 2023-11-14 | Discharge: 2023-11-14 | Disposition: A | Discharge status: Home / Self Care | Attending: Emergency Medicine | Admitting: Emergency Medicine

## 2023-11-14 ENCOUNTER — Emergency Department (HOSPITAL_COMMUNITY)

## 2023-11-14 ENCOUNTER — Encounter (HOSPITAL_COMMUNITY): Payer: Self-pay | Admitting: Radiology

## 2023-11-14 ENCOUNTER — Ambulatory Visit (INDEPENDENT_AMBULATORY_CARE_PROVIDER_SITE_OTHER): Payer: Self-pay | Admitting: Cardiology

## 2023-11-14 DIAGNOSIS — Z7901 Long term (current) use of anticoagulants: Secondary | ICD-10-CM | POA: Diagnosis not present

## 2023-11-14 DIAGNOSIS — S22080A Wedge compression fracture of T11-T12 vertebra, initial encounter for closed fracture: Secondary | ICD-10-CM | POA: Diagnosis not present

## 2023-11-14 DIAGNOSIS — K5730 Diverticulosis of large intestine without perforation or abscess without bleeding: Secondary | ICD-10-CM | POA: Diagnosis not present

## 2023-11-14 DIAGNOSIS — R531 Weakness: Secondary | ICD-10-CM | POA: Diagnosis not present

## 2023-11-14 DIAGNOSIS — Z794 Long term (current) use of insulin: Secondary | ICD-10-CM | POA: Insufficient documentation

## 2023-11-14 DIAGNOSIS — W010XXA Fall on same level from slipping, tripping and stumbling without subsequent striking against object, initial encounter: Secondary | ICD-10-CM | POA: Insufficient documentation

## 2023-11-14 DIAGNOSIS — R109 Unspecified abdominal pain: Secondary | ICD-10-CM | POA: Insufficient documentation

## 2023-11-14 DIAGNOSIS — I672 Cerebral atherosclerosis: Secondary | ICD-10-CM | POA: Diagnosis not present

## 2023-11-14 DIAGNOSIS — S22079A Unspecified fracture of T9-T10 vertebra, initial encounter for closed fracture: Secondary | ICD-10-CM | POA: Insufficient documentation

## 2023-11-14 DIAGNOSIS — Z79899 Other long term (current) drug therapy: Secondary | ICD-10-CM | POA: Insufficient documentation

## 2023-11-14 DIAGNOSIS — S0990XA Unspecified injury of head, initial encounter: Secondary | ICD-10-CM | POA: Diagnosis not present

## 2023-11-14 DIAGNOSIS — I10 Essential (primary) hypertension: Secondary | ICD-10-CM | POA: Diagnosis not present

## 2023-11-14 DIAGNOSIS — I517 Cardiomegaly: Secondary | ICD-10-CM | POA: Diagnosis not present

## 2023-11-14 DIAGNOSIS — R140 Abdominal distension (gaseous): Secondary | ICD-10-CM | POA: Diagnosis not present

## 2023-11-14 DIAGNOSIS — I480 Paroxysmal atrial fibrillation: Secondary | ICD-10-CM

## 2023-11-14 DIAGNOSIS — M546 Pain in thoracic spine: Secondary | ICD-10-CM | POA: Diagnosis not present

## 2023-11-14 DIAGNOSIS — W19XXXA Unspecified fall, initial encounter: Secondary | ICD-10-CM | POA: Diagnosis not present

## 2023-11-14 DIAGNOSIS — R1084 Generalized abdominal pain: Secondary | ICD-10-CM | POA: Diagnosis not present

## 2023-11-14 LAB — CBC WITH DIFFERENTIAL/PLATELET
Abs Immature Granulocytes: 0.23 10*3/uL — ABNORMAL HIGH (ref 0.00–0.07)
Basophils Absolute: 0.1 10*3/uL (ref 0.0–0.1)
Basophils Relative: 1 %
Eosinophils Absolute: 0.1 10*3/uL (ref 0.0–0.5)
Eosinophils Relative: 1 %
HCT: 48 % — ABNORMAL HIGH (ref 36.0–46.0)
Hemoglobin: 15.4 g/dL — ABNORMAL HIGH (ref 12.0–15.0)
Immature Granulocytes: 3 %
Lymphocytes Relative: 14 %
Lymphs Abs: 1.3 10*3/uL (ref 0.7–4.0)
MCH: 27.9 pg (ref 26.0–34.0)
MCHC: 32.1 g/dL (ref 30.0–36.0)
MCV: 87 fL (ref 80.0–100.0)
Monocytes Absolute: 0.9 10*3/uL (ref 0.1–1.0)
Monocytes Relative: 10 %
Neutro Abs: 6.5 10*3/uL (ref 1.7–7.7)
Neutrophils Relative %: 71 %
Platelets: 161 10*3/uL (ref 150–400)
RBC: 5.52 MIL/uL — ABNORMAL HIGH (ref 3.87–5.11)
RDW: 19.3 % — ABNORMAL HIGH (ref 11.5–15.5)
WBC: 9.1 10*3/uL (ref 4.0–10.5)
nRBC: 0 % (ref 0.0–0.2)

## 2023-11-14 LAB — I-STAT CHEM 8, ED
BUN: 33 mg/dL — ABNORMAL HIGH (ref 8–23)
Calcium, Ion: 1.19 mmol/L (ref 1.15–1.40)
Chloride: 98 mmol/L (ref 98–111)
Creatinine, Ser: 1.2 mg/dL — ABNORMAL HIGH (ref 0.44–1.00)
Glucose, Bld: 151 mg/dL — ABNORMAL HIGH (ref 70–99)
HCT: 46 % (ref 36.0–46.0)
Hemoglobin: 15.6 g/dL — ABNORMAL HIGH (ref 12.0–15.0)
Potassium: 3.9 mmol/L (ref 3.5–5.1)
Sodium: 138 mmol/L (ref 135–145)
TCO2: 31 mmol/L (ref 22–32)

## 2023-11-14 LAB — PROTIME-INR
INR: 2.1 — ABNORMAL HIGH (ref 0.8–1.2)
Prothrombin Time: 24 s — ABNORMAL HIGH (ref 11.4–15.2)

## 2023-11-14 LAB — TYPE AND SCREEN
ABO/RH(D): O POS
Antibody Screen: NEGATIVE

## 2023-11-14 MED ORDER — ACETAMINOPHEN 500 MG PO TABS: 500.0000 mg | ORAL_TABLET | Freq: Four times a day (QID) | ORAL | 0 refills | Status: AC | PRN

## 2023-11-14 MED ORDER — OXYCODONE-ACETAMINOPHEN 5-325 MG PO TABS
1.0000 | ORAL_TABLET | Freq: Once | ORAL | Status: AC
  Administered 2023-11-14: 1 via ORAL
  Filled 2023-11-14: qty 1

## 2023-11-14 MED ORDER — OXYCODONE-ACETAMINOPHEN 5-325 MG PO TABS: 1.0000 | ORAL_TABLET | Freq: Three times a day (TID) | ORAL | 0 refills | Status: DC | PRN

## 2023-11-14 MED ORDER — FENTANYL CITRATE PF 50 MCG/ML IJ SOSY
50.0000 ug | PREFILLED_SYRINGE | Freq: Once | INTRAMUSCULAR | Status: AC
  Administered 2023-11-14: 50 ug via INTRAVENOUS
  Filled 2023-11-14: qty 1

## 2023-11-14 MED ORDER — SODIUM CHLORIDE (PF) 0.9 % IJ SOLN
INTRAMUSCULAR | Status: AC
  Filled 2023-11-14: qty 50

## 2023-11-14 MED ORDER — IOHEXOL 300 MG/ML  SOLN
75.0000 mL | Freq: Once | INTRAMUSCULAR | Status: AC | PRN
  Administered 2023-11-14: 75 mL via INTRAVENOUS

## 2023-11-14 NOTE — ED Triage Notes (Signed)
 Pt BIBA PTAR for fall, was trying to pick up a dropped pill and was leaning on the walker when it rolled away and she fell on her belly. Reports pain in mid abd wrapping around to back. No head trauma, no LOC, on coumadin. Has an old bruise on left inner thigh.   148/88 HR 64 96% RA CBG 225

## 2023-11-14 NOTE — ED Provider Notes (Signed)
 [place] EMERGENCY DEPARTMENT AT [facility] Provider Note   CSN: [id number] Arrival date & time: 11/14/23  0745     History  Chief Complaint  Patient presents with   Tracey Todd is a 89 y.o. female.  HPI    89 year old female comes in with chief complaint of PAF, HTN taking Coumadin who comes in with chief complaint of fall.  Patient had a slip and a fall earlier today.  She fell forward, having significant trauma straight to her abdomen.  She is complaining of primarily pain to her abdomen that is radiating to her back.  Patient denies any severe chest pain, lower extremity pain, pelvic pain.  She does not think she struck her head.  She is not having any headache, neck pain, upper extremity weakness or numbness, nausea or vomiting.  Patient's family members at the bedside.  They indicate that patient's INR was over 4.1 last time they checked it, currently she is taking the adjusted dose of Coumadin.  Home Medications Prior to Admission medications   Medication Sig Start Date End Date Taking? Authorizing Provider  acetaminophen (TYLENOL) 500 MG tablet Take 1 tablet (500 mg total) by mouth every 6 (six) hours as needed. 11/14/23  Yes Derwood Kaplan, MD  oxyCODONE-acetaminophen (PERCOCET/ROXICET) 5-325 MG tablet Take 1 tablet by mouth every 8 (eight) hours as needed for severe pain (pain score 7-10). 11/14/23  Yes Derwood Kaplan, MD  amLODipine (NORVASC) 5 MG tablet Take 1 tablet by mouth once daily 05/05/23   Rema Fendt, NP  atorvastatin (LIPITOR) 80 MG tablet Take 1 tablet (80 mg total) by mouth daily. 12/03/22   Christell Constant, MD  bisoprolol (ZEBETA) 10 MG tablet Take 1 tablet by mouth once daily for blood pressure 07/12/23   Zonia Kief, Amy J, NP  Blood Glucose Monitoring Suppl (ONETOUCH VERIO) w/Device KIT Use to check fasting blood sugar daily. Dx: E11.59 08/15/19   Claiborne Rigg, NP  brimonidine (ALPHAGAN) 0.2 % ophthalmic solution Place 1  drop into both eyes 2 (two) times daily. 05/06/23   [provider]  calcium carbonate (OS-CAL) 600 MG TABS tablet Take 600 mg by mouth daily with breakfast.    [provider]  cetirizine (ZYRTEC) 10 MG chewable tablet as needed for allergies.    [provider]  Cholecalciferol (VITAMIN D) 2000 UNITS CAPS Take 6,000 Units by mouth daily.     [provider]  dapagliflozin propanediol (FARXIGA) 10 MG TABS tablet Take 1 tablet (10 mg total) by mouth daily before breakfast. 11/02/23   Chandrasekhar, Mahesh A, MD  fluticasone (FLONASE) 50 MCG/ACT nasal spray Place 2 sprays into both nostrils 2 (two) times daily. 09/27/23   Read Drivers, MD  gabapentin (NEURONTIN) 300 MG capsule Take 1 capsule (300 mg total) by mouth 2 (two) times daily. 01/11/23   Nita Sickle K, DO  glucose blood (ONETOUCH VERIO) test strip USE TO CHECK FASTING BLOOD SUGAR TWICE DAILY 09/14/22   Rema Fendt, NP  insulin glargine (LANTUS SOLOSTAR) 100 UNIT/ML Solostar Pen Inject 12 Units into the skin at bedtime.    [provider]  Lancets (ONETOUCH DELICA PLUS LANCET33G) MISC USE TO CHECK FASTING BLOOD SUGAR TWICE DAILY 09/14/22   Rema Fendt, NP  Magnesium 500 MG TABS Take 1,000 mg by mouth in the morning and at bedtime. 2 tabs am, 2 tab pm    [provider]  MM PEN NEEDLES 32G X 4  MM MISC  09/12/23   [provider]  Semaglutide,0.25 or 0.5MG /DOS, (OZEMPIC, 0.25 OR 0.5 MG/DOSE,) 2 MG/3ML SOPN Inject 0.5 mg into the skin once a week. 04/21/23   [provider]  spironolactone (ALDACTONE) 25 MG tablet Take 1/2 (one-half) tablet by mouth once daily 07/07/23   Riley Lam A, MD  torsemide (DEMADEX) 20 MG tablet Take 2 tablets (40 mg) by mouth daily in the mornings, Take 1 tablet (20 mg) by mouth on Monday, Wednesday, and Friday in the afternoon. 09/22/23   Tereso Newcomer T, PA-C  triamcinolone (KENALOG) 0.025 % cream Apply 1 Application topically 2 (two)  times daily. 05/25/23   Rema Fendt, NP  warfarin (COUMADIN) 5 MG tablet TAKE 1 & 1/2 (ONE & ONE-HALF) TABLETS BY MOUTH ONCE DAILY AS DIRECTED BY  ANTICOAGULATION  CLINIC 08/08/23   Riley Lam A, MD      Allergies    Penicillin g, Penicillins, Quinapril, Other, Piroxicam, Calcium-containing compounds, Celecoxib, and Oxaprozin    Review of Systems   Review of Systems  All other systems reviewed and are negative.   Physical Exam Updated Vital Signs BP 125/82 (BP Location: Right Arm)   Pulse 64   Temp 97.7 F (36.5 C) (Oral)   Resp 14   SpO2 98%  Physical Exam Vitals and nursing note reviewed.  Constitutional:      Appearance: She is well-developed. She is not toxic-appearing or diaphoretic.  HENT:     Head: Normocephalic and atraumatic.  Eyes:     Extraocular Movements: Extraocular movements intact.  Neck:     Comments: No midline c-spine tenderness, pt able to turn head to 45 degrees bilaterally without any pain and able to flex neck to the chest and extend without any pain or neurologic symptoms.  Cardiovascular:     Rate and Rhythm: Normal rate.  Pulmonary:     Effort: Pulmonary effort is normal.  Abdominal:     General: There is distension.     Tenderness: There is abdominal tenderness.  Musculoskeletal:        General: No tenderness or deformity.     Cervical back: Normal range of motion and neck supple.     Comments: Patient has tenderness over the lower T spine  Skin:    General: Skin is dry.     Findings: No bruising.  Neurological:     Mental Status: She is alert and oriented to person, place, and time.     ED Results / Procedures / Treatments   Labs (all labs ordered are listed, but only abnormal results are displayed) Labs Reviewed  CBC WITH DIFFERENTIAL/PLATELET - Abnormal; Notable for the following components:      Result Value   RBC 5.52 (*)    Hemoglobin 15.4 (*)    HCT 48.0 (*)    RDW 19.3 (*)    Abs Immature Granulocytes 0.23 (*)     All other components within normal limits  PROTIME-INR - Abnormal; Notable for the following components:   Prothrombin Time 24.0 (*)    INR 2.1 (*)    All other components within normal limits  I-STAT CHEM 8, ED - Abnormal; Notable for the following components:   BUN 33 (*)    Creatinine, Ser 1.20 (*)    Glucose, Bld 151 (*)    Hemoglobin 15.6 (*)    All other components within normal limits  TYPE AND SCREEN    EKG None  Radiology CT Head Wo Contrast Result Date:  11/14/2023 CLINICAL DATA:  Head trauma, minor (Age >= 65y) anticoagulation. Fall. EXAM: CT HEAD WITHOUT CONTRAST TECHNIQUE: Contiguous axial images were obtained from the base of the skull through the vertex without intravenous contrast. RADIATION DOSE REDUCTION: This exam was performed according to the departmental dose-optimization program which includes automated exposure control, adjustment of the mA and/or kV according to patient size and/or use of iterative reconstruction technique. COMPARISON:  CT scan head from 06/09/2017. FINDINGS: Brain: No evidence of acute infarction, hemorrhage, hydrocephalus, extra-axial collection or mass lesion/mass effect. Ventricles are normal. Cerebral volume is age appropriate. Vascular: No hyperdense vessel or unexpected calcification. Intracranial arteriosclerosis. Skull: Normal. Negative for fracture or focal lesion. Sinuses/Orbits: No acute finding. Other: Visualized mastoid air cells are unremarkable. No mastoid effusion. IMPRESSION: No acute intracranial abnormality. Electronically Signed   By: Jules Schick M.D.   On: 11/14/2023 10:42   CT ABDOMEN PELVIS W CONTRAST Result Date: 11/14/2023 CLINICAL DATA:  Abdominal trauma, blunt. Fall. Mid abdominal pain wrapping around to back. EXAM: CT ABDOMEN AND PELVIS WITH CONTRAST TECHNIQUE: Multidetector CT imaging of the abdomen and pelvis was performed using the standard protocol following bolus administration of intravenous contrast. RADIATION  DOSE REDUCTION: This exam was performed according to the departmental dose-optimization program which includes automated exposure control, adjustment of the mA and/or kV according to patient size and/or use of iterative reconstruction technique. CONTRAST:  75mL OMNIPAQUE IOHEXOL 300 MG/ML  SOLN COMPARISON:  CT scan lumbar spine from 12/17/2021. FINDINGS: Lower chest: There are patchy atelectatic changes in the visualized lung bases. No overt consolidation. No pleural effusion. The heart is mildly enlarged in size. No pericardial effusion. There are coronary artery atherosclerotic calcifications, in keeping with coronary artery disease. Hepatobiliary: The liver is normal in size. Non-cirrhotic configuration. No suspicious mass. These is mild diffuse hepatic steatosis. No intrahepatic or extrahepatic bile duct dilation. No calcified gallstones. Normal gallbladder wall thickness. No pericholecystic inflammatory changes. Pancreas: Unremarkable. No pancreatic ductal dilatation or surrounding inflammatory changes. Spleen: Within normal limits. No focal lesion. Adrenals/Urinary Tract: Adrenal glands are unremarkable. No suspicious renal mass. There is a partially exophytic simple cyst arising from the right kidney lower pole, posteriorly measuring 4.1 x 4.6 cm. No nephroureterolithiasis or obstructive uropathy. Unremarkable urinary bladder. Stomach/Bowel: No disproportionate dilation of the small or large bowel loops. No evidence of abnormal bowel wall thickening or inflammatory changes. The appendix is unremarkable. There are multiple diverticula throughout the colon, without imaging signs of diverticulitis. Vascular/Lymphatic: No ascites or pneumoperitoneum. No abdominal or pelvic lymphadenopathy, by size criteria. No aneurysmal dilation of the major abdominal arteries. There are moderate peripheral atherosclerotic vascular calcifications of the aorta and its major branches. Reproductive: The uterus is unremarkable. No  large adnexal mass. Other: The visualized soft tissues and abdominal wall are unremarkable. Musculoskeletal: No suspicious osseous lesions. There are mild - moderate multilevel degenerative changes in the visualized spine. Note is made of transitional lumbosacral element with partial sacralization of L5 with pseudoarthrosis of the bilateral L5 transverse processes with the sacral ala. There is minimally displaced fracture of the anterior aspect of T9 vertebral body which extends into the right-sided bridging osteophyte. The fracture line does not reach the posterior vertebral margin. No loss of vertebral height. Note is made of multiple bridging anterior marginal osteophyte predominantly of the lower thoracic spine which may be due to degenerative changes or ankylosis. Findings favor extension distraction injury. IMPRESSION: 1. There is minimally displaced fracture of the anterior aspect of T9 vertebral body which extends into  the right-sided bridging osteophyte. The fracture line does not reach the posterior vertebral margin. No loss of vertebral height. Findings favor extension distraction injury. 2. No other acute traumatic injury to the abdomen or pelvis. 3. Multiple other nonacute observations, as described above. Aortic Atherosclerosis (ICD10-I70.0). Electronically Signed   By: Jules Schick M.D.   On: 11/14/2023 10:39   DG Chest Port 1 View Result Date: 11/14/2023 CLINICAL DATA:  Fall.  Weakness.  Blunt trauma. EXAM: PORTABLE CHEST 1 VIEW COMPARISON:  09/16/2023 FINDINGS: Stable cardiomegaly. Permanent pacemaker remains in appropriate position. Both lungs are clear. IMPRESSION: Stable cardiomegaly.  No active lung disease. Electronically Signed   By: Danae Orleans M.D.   On: 11/14/2023 09:19    Procedures Procedures    Medications Ordered in ED Medications  fentaNYL (SUBLIMAZE) injection 50 mcg (50 mcg Intravenous Given 11/14/23 0910)  iohexol (OMNIPAQUE) 300 MG/ML solution 75 mL (75 mLs  Intravenous Contrast Given 11/14/23 0944)  oxyCODONE-acetaminophen (PERCOCET/ROXICET) 5-325 MG per tablet 1 tablet (1 tablet Oral Given 11/14/23 1217)    ED Course/ Medical Decision Making/ A&P                                 Medical Decision Making Amount and/or Complexity of Data Reviewed Labs: ordered. Radiology: ordered.  Risk OTC drugs. Prescription drug management.   89 year old patient comes in after sustaining what appears to be a mechanical fall. Pertinent past medical includes -Coumadin use for A-fib.  Patient's INR was over 4.1 per last check per family member. Collateral history provided by patient's daughter was at the bedside.  Based on my history and exam, differential diagnosis includes: - Traumatic brain injury including intracranial hemorrhage - Long bone fractures - Contusions - Soft tissue injury -Intra-abdominal bleeding -Solid organ fracture  Based on the initial assessment, the following workup was initiated CT scan of the abdomen and pelvis with contrast and CT scan of the head without contrast to rule out brain bleed.  I feel comfortable clearing the C-spine clinically.  Patient will get x-ray of the chest, I do not think she needs CT of the chest.  I have independently interpreted the following imaging from the perspective of acute trauma: CT scan of the head  and the results indicate no evidence of brain bleed.  Per radiologist, CT abdomen pelvis does indicate patient having thoracic spine fracture, vertebral body, minimally displaced Patient's motor and neuroexam for lower extremities reassuring.  We will focus on symptom management.  Results discussed with the patient.  Final Clinical Impression(s) / ED Diagnoses Final diagnoses:  Closed fracture of ninth thoracic vertebra, unspecified fracture morphology, initial encounter [facility])    Rx / DC Orders ED Discharge Orders          Ordered    oxyCODONE-acetaminophen (PERCOCET/ROXICET) 5-325 MG tablet   Every 8 hours PRN        11/14/23 1304    acetaminophen (TYLENOL) 500 MG tablet  Every 6 hours PRN        11/14/23 1304              Derwood Kaplan, MD 11/14/23 1310

## 2023-11-14 NOTE — ED Notes (Signed)
 Pt ambulated 30ft with walker and stand by assistance. Tolerated well.

## 2023-11-14 NOTE — Progress Notes (Signed)
 Orthopedic Tech Progress Note Patient Details:  Tracey Todd [birth year] [id number]  Ortho Devices Type of Ortho Device: Thoracolumbar corset (TLSO) Ortho Device/Splint Interventions: Ordered, Adjustment   Post Interventions Patient Tolerated: Well Instructions Provided: Care of device, Adjustment of device  [name] 11/14/2023, 1:41 PM

## 2023-11-14 NOTE — Discharge Instructions (Addendum)
 You have a fracture of your thoracic spine.  The CT scan of your abdomen is reassuring from trauma perspective.  Take the stronger narcotic pain medicine only if the pain is excruciating.  Otherwise take Tylenol every 4-6 hours.

## 2023-11-15 ENCOUNTER — Ambulatory Visit: Payer: Medicare Other

## 2023-11-18 ENCOUNTER — Ambulatory Visit (INDEPENDENT_AMBULATORY_CARE_PROVIDER_SITE_OTHER): Admitting: Family

## 2023-11-18 DIAGNOSIS — S22078D Other fracture of T9-T10 vertebra, subsequent encounter for fracture with routine healing: Secondary | ICD-10-CM | POA: Diagnosis not present

## 2023-11-18 DIAGNOSIS — R262 Difficulty in walking, not elsewhere classified: Secondary | ICD-10-CM | POA: Diagnosis not present

## 2023-11-18 DIAGNOSIS — Z0289 Encounter for other administrative examinations: Secondary | ICD-10-CM

## 2023-11-18 DIAGNOSIS — S22079D Unspecified fracture of T9-T10 vertebra, subsequent encounter for fracture with routine healing: Secondary | ICD-10-CM

## 2023-11-18 NOTE — Progress Notes (Signed)
 Virtual Visit via Telephone Note  I connected with Tracey Todd, on 11/18/2023 at 11:24 AM by telephone and verified that I am speaking with the correct person using two identifiers.  Consent: I discussed the limitations, risks, security and privacy concerns of performing an evaluation and management service by telephone and the availability of in person appointments. I also discussed with the patient that there may be a patient responsible charge related to this service. The patient expressed understanding and agreed to proceed.   Location of Patient: Home  Location of Provider: [facility]   Persons participating in Telemedicine visit: Randel Pigg, NP Curley Spice, CMA  History of Present Illness: Tracey Todd is a 89 y.o. female who presents for walker to assist with walking. Patient was recently seen at [facility] Emergency Department at [facility] on 11/14/2023 for closed fracture of ninth thoracic vertebra secondary to fall. Denies red flag symptoms. States she has upcoming appointment with Neurosurgery on next week. No further issues/concerns for discussion today.    Past Medical History:  Diagnosis Date   Atrial flutter (HCC)    a. mentioned in 2016 admission.   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 minimal by cath 2016.   Nodule of chest wall    Normal coronary arteries and LVF 10/23/14 10/24/2014   Pacemaker implanted 10/25/14- St Jude 10/26/2014   Paroxysmal atrial fibrillation (HCC)    a. identified on device interrogation, burden low   Pulmonary hypertension (HCC)    Sick sinus syndrome (HCC)    a. s/p STJ dual chamber PPM    Type II or unspecified type diabetes mellitus without mention of complication, not stated as uncontrolled     Vitamin D deficiency    Allergies  Allergen Reactions   Penicillin G Anaphylaxis   Penicillins Anaphylaxis   Quinapril Anaphylaxis    Angioedema   Other     Other Reaction(s): irritation of skin   Piroxicam Other (See Comments)    Bleeding  Other Reaction(s): blood in stool   Calcium-Containing Compounds Other (See Comments)    "calcium deposits on skin"   Celecoxib Itching   Oxaprozin Itching and Other (See Comments)    Current Outpatient Medications on File Prior to Visit  Medication Sig Dispense Refill   acetaminophen (TYLENOL) 500 MG tablet Take 1 tablet (500 mg total) by mouth every 6 (six) hours as needed. 30 tablet 0   amLODipine (NORVASC) 5 MG tablet Take 1 tablet by mouth once daily 90 tablet 0   atorvastatin (LIPITOR) 80 MG tablet Take 1 tablet (80 mg total) by mouth daily. 90 tablet 3   bisoprolol (ZEBETA) 10 MG tablet Take 1 tablet by mouth once daily for blood pressure 90 tablet 0   Blood Glucose Monitoring Suppl (ONETOUCH VERIO) w/Device KIT Use to check fasting blood sugar daily. Dx: E11.59 1 kit 0   brimonidine (ALPHAGAN) 0.2 % ophthalmic solution Place 1 drop into both eyes 2 (two) times daily.     calcium carbonate (OS-CAL) 600 MG TABS tablet Take 600 mg by mouth daily with breakfast.     cetirizine (ZYRTEC) 10 MG chewable tablet as needed for allergies.     Cholecalciferol (VITAMIN D) 2000 UNITS CAPS Take 6,000  Units by mouth daily.      dapagliflozin propanediol (FARXIGA) 10 MG TABS tablet Take 1 tablet (10 mg total) by mouth daily before breakfast. 90 tablet 3   fluticasone (FLONASE) 50 MCG/ACT nasal spray Place 2 sprays into both nostrils 2 (two) times daily. 16 g 6   gabapentin (NEURONTIN) 300 MG capsule Take 1 capsule (300 mg total) by mouth 2 (two) times daily. 180 capsule 3   glucose blood (ONETOUCH VERIO) test strip USE TO CHECK FASTING BLOOD SUGAR TWICE DAILY 100 each 11   insulin glargine (LANTUS SOLOSTAR) 100 UNIT/ML Solostar Pen Inject 12 Units into  the skin at bedtime.     Lancets (ONETOUCH DELICA PLUS LANCET33G) MISC USE TO CHECK FASTING BLOOD SUGAR TWICE DAILY 100 each 2   Magnesium 500 MG TABS Take 1,000 mg by mouth in the morning and at bedtime. 2 tabs am, 2 tab pm     MM PEN NEEDLES 32G X 4 MM MISC      oxyCODONE-acetaminophen (PERCOCET/ROXICET) 5-325 MG tablet Take 1 tablet by mouth every 8 (eight) hours as needed for severe pain (pain score 7-10). 9 tablet 0   Semaglutide,0.25 or 0.5MG /DOS, (OZEMPIC, 0.25 OR 0.5 MG/DOSE,) 2 MG/3ML SOPN Inject 0.5 mg into the skin once a week.     spironolactone (ALDACTONE) 25 MG tablet Take 1/2 (one-half) tablet by mouth once daily 45 tablet 1   torsemide (DEMADEX) 20 MG tablet Take 2 tablets (40 mg) by mouth daily in the mornings, Take 1 tablet (20 mg) by mouth on Monday, Wednesday, and Friday in the afternoon.     triamcinolone (KENALOG) 0.025 % cream Apply 1 Application topically 2 (two) times daily. 60 g 0   warfarin (COUMADIN) 5 MG tablet TAKE 1 & 1/2 (ONE & ONE-HALF) TABLETS BY MOUTH ONCE DAILY AS DIRECTED BY  ANTICOAGULATION  CLINIC 40 tablet 3   No current facility-administered medications on file prior to visit.    Observations/Objective: Alert and oriented x 3. Not in acute distress. Physical examination not completed as this is a telemedicine visit.  Assessment and Plan: 1. Encounter for completion of form with patient (Primary) 2. Difficulty walking - DME order for walker signed earlier this week.   3. Closed fracture of ninth thoracic vertebra with routine healing, unspecified fracture morphology, subsequent encounter - Keep all scheduled appointments with Neurosurgery.   Follow Up Instructions: Follow-up with primary provider as scheduled. Keep all scheduled appointments with Neurosurgery.   Patient was given clear instructions to go to Emergency Department or return to medical center if symptoms don't improve, worsen, or new problems develop.The patient verbalized  understanding.  I discussed the assessment and treatment plan with the patient. The patient was provided an opportunity to ask questions and all were answered. The patient agreed with the plan and demonstrated an understanding of the instructions.   The patient was advised to call back or seek an in-person evaluation if the symptoms worsen or if the condition fails to improve as anticipated.    I provided 7 minutes total of non-face-to-face time during this encounter.   Rema Fendt, NP  [facility] at [facility] [place], [place] [phone] 11/18/2023, 11:24 AM

## 2023-11-21 ENCOUNTER — Other Ambulatory Visit: Payer: Self-pay | Admitting: Family

## 2023-11-21 ENCOUNTER — Ambulatory Visit: Payer: Medicare Other | Admitting: Podiatry

## 2023-11-21 ENCOUNTER — Encounter: Payer: Self-pay | Admitting: Podiatry

## 2023-11-21 ENCOUNTER — Other Ambulatory Visit: Payer: Self-pay | Admitting: Internal Medicine

## 2023-11-21 VITALS — Ht <= 58 in | Wt 184.0 lb

## 2023-11-21 DIAGNOSIS — B351 Tinea unguium: Secondary | ICD-10-CM

## 2023-11-21 DIAGNOSIS — I10 Essential (primary) hypertension: Secondary | ICD-10-CM

## 2023-11-21 DIAGNOSIS — M79676 Pain in unspecified toe(s): Secondary | ICD-10-CM | POA: Diagnosis not present

## 2023-11-21 DIAGNOSIS — E1142 Type 2 diabetes mellitus with diabetic polyneuropathy: Secondary | ICD-10-CM | POA: Diagnosis not present

## 2023-11-21 NOTE — Progress Notes (Signed)
This patient returns to my office for at risk foot care.  This patient requires this care by a professional since this patient will be at risk due to having diabetes and coagulation defect.  Patient is taking coumadin.  This patient is unable to cut nails herself since the patient cannot reach her nails.These nails are painful walking and wearing shoes.  This patient presents for at risk foot care today.  General Appearance  Alert, conversant and in no acute stress.  Vascular  Dorsalis pedis and posterior tibial  pulses are weakly  palpable  bilaterally.  Capillary return is within normal limits  bilaterally. Temperature is within normal limits  Bilaterally. Absent hair.  Neurologic  Senn-Weinstein monofilament wire test within normal limits  bilaterally. Muscle power within normal limits bilaterally.  Nails Thick disfigured discolored nails with subungual debris  from hallux to fifth toes bilaterally. No evidence of bacterial infection or drainage bilaterally.  Orthopedic  No limitations of motion  feet .  No crepitus or effusions noted.  No bony pathology or digital deformities noted.  Skin  normotropic skin with no porokeratosis noted bilaterally.  No signs of infections or ulcers noted.     Onychomycosis  Pain in right toes  Pain in left toes  Consent was obtained for treatment procedures.   Mechanical debridement of nails 1-5  bilaterally performed with a nail nipper. No dremel usage.   Return office visit   3 months                   Told patient to return for periodic foot care and evaluation due to potential at risk complications.   Helane Gunther DPM

## 2023-11-22 NOTE — Telephone Encounter (Signed)
 I have attempted to contact this patient by phone with the following results: no answer.Tracey Todd has been ordered and patient should be contacted by [employer] .

## 2023-11-24 ENCOUNTER — Ambulatory Visit (INDEPENDENT_AMBULATORY_CARE_PROVIDER_SITE_OTHER): Payer: Medicare Other

## 2023-11-24 DIAGNOSIS — I495 Sick sinus syndrome: Secondary | ICD-10-CM

## 2023-11-24 DIAGNOSIS — S22078A Other fracture of T9-T10 vertebra, initial encounter for closed fracture: Secondary | ICD-10-CM | POA: Diagnosis not present

## 2023-11-24 LAB — CUP PACEART REMOTE DEVICE CHECK
Battery Remaining Longevity: 12 mo
Battery Remaining Percentage: 11 %
Battery Voltage: 2.84 V
Brady Statistic AP VP Percent: 0 %
Brady Statistic AP VS Percent: 0 %
Brady Statistic AS VP Percent: 0 %
Brady Statistic AS VS Percent: 0 %
Brady Statistic RA Percent Paced: 0 %
Brady Statistic RV Percent Paced: 99 %
Date Time Interrogation Session: 20250327120635
Implantable Lead Connection Status: 753985
Implantable Lead Connection Status: 753985
Implantable Lead Implant Date: 20160226
Implantable Lead Implant Date: 20160226
Implantable Lead Location: 753859
Implantable Lead Location: 753860
Implantable Lead Model: 1948
Implantable Pulse Generator Implant Date: 20160226
Lead Channel Impedance Value: 440 Ohm
Lead Channel Impedance Value: 560 Ohm
Lead Channel Pacing Threshold Amplitude: 0.5 V
Lead Channel Pacing Threshold Amplitude: 0.625 V
Lead Channel Pacing Threshold Pulse Width: 0.4 ms
Lead Channel Pacing Threshold Pulse Width: 0.4 ms
Lead Channel Sensing Intrinsic Amplitude: 1.8 mV
Lead Channel Sensing Intrinsic Amplitude: 8.6 mV
Lead Channel Setting Pacing Amplitude: 2.5 V
Lead Channel Setting Pacing Amplitude: 2.5 V
Lead Channel Setting Pacing Pulse Width: 0.4 ms
Lead Channel Setting Sensing Sensitivity: 2 mV
Pulse Gen Model: 2240
Pulse Gen Serial Number: 7700661

## 2023-11-25 ENCOUNTER — Telehealth: Payer: Self-pay | Admitting: Cardiology

## 2023-11-25 ENCOUNTER — Ambulatory Visit: Attending: Cardiovascular Disease | Admitting: *Deleted

## 2023-11-25 DIAGNOSIS — Z7901 Long term (current) use of anticoagulants: Secondary | ICD-10-CM | POA: Diagnosis not present

## 2023-11-25 DIAGNOSIS — I480 Paroxysmal atrial fibrillation: Secondary | ICD-10-CM | POA: Diagnosis not present

## 2023-11-25 LAB — POCT INR: INR: 6.7 — AB (ref 2.0–3.0)

## 2023-11-25 LAB — PROTIME-INR
INR: 6.3 (ref 0.9–1.2)
Prothrombin Time: 62.1 s — ABNORMAL HIGH (ref 9.1–12.0)

## 2023-11-25 NOTE — Telephone Encounter (Signed)
 Spoke with Tracey Todd with a stat lab.  INR 6.3 and Prothrombin time 62.1. Forwarding to anti-coag.

## 2023-11-25 NOTE — Patient Instructions (Addendum)
 Description   Spoke with pt and advised not to take any warfarin today and no warfarin Saturday and no warfarin Sunday then START taking warfarin 1 TABLET DAILY EXCEPT 1/2 TABLET ON SUNDAY and THURSDAY. REPORT TO ER WITH ANY BLEEDING, FALLS, ACCIDENTS Continue eating 2 serving of greens per week plus your one day of brussels sprouts.  Recheck INR in 1 week.  Coumadin Clinic [phone]

## 2023-11-25 NOTE — Telephone Encounter (Signed)
 Spoke with pt and discussed results and warfarin doseage. Please refer Anticoagulation Encounter from today.

## 2023-11-25 NOTE — Telephone Encounter (Signed)
 Calling with stat lab results

## 2023-11-28 DIAGNOSIS — S22079D Unspecified fracture of T9-T10 vertebra, subsequent encounter for fracture with routine healing: Secondary | ICD-10-CM | POA: Diagnosis not present

## 2023-11-28 DIAGNOSIS — R262 Difficulty in walking, not elsewhere classified: Secondary | ICD-10-CM | POA: Diagnosis not present

## 2023-11-29 DIAGNOSIS — E119 Type 2 diabetes mellitus without complications: Secondary | ICD-10-CM | POA: Diagnosis not present

## 2023-12-01 ENCOUNTER — Ambulatory Visit: Attending: Cardiology | Admitting: *Deleted

## 2023-12-01 DIAGNOSIS — Z5181 Encounter for therapeutic drug level monitoring: Secondary | ICD-10-CM | POA: Diagnosis not present

## 2023-12-01 DIAGNOSIS — Z7901 Long term (current) use of anticoagulants: Secondary | ICD-10-CM | POA: Diagnosis not present

## 2023-12-01 DIAGNOSIS — I480 Paroxysmal atrial fibrillation: Secondary | ICD-10-CM | POA: Diagnosis not present

## 2023-12-01 LAB — POCT INR: POC INR: 1.9

## 2023-12-01 NOTE — Patient Instructions (Signed)
 Description   Take 1 tablet of warfarin today and then continue taking warfarin 1 TABLET DAILY EXCEPT 1/2 TABLET ON SUNDAY and THURSDAY. REPORT TO ER WITH ANY BLEEDING, FALLS, ACCIDENTS Continue eating 2 serving of greens per week plus your one day of brussels sprouts.  Recheck INR in 1 week.  Coumadin Clinic [phone]

## 2023-12-08 ENCOUNTER — Ambulatory Visit: Attending: Cardiovascular Disease

## 2023-12-08 DIAGNOSIS — I480 Paroxysmal atrial fibrillation: Secondary | ICD-10-CM

## 2023-12-08 DIAGNOSIS — Z7901 Long term (current) use of anticoagulants: Secondary | ICD-10-CM

## 2023-12-08 LAB — POCT INR: INR: 4 — AB (ref 2.0–3.0)

## 2023-12-08 NOTE — Patient Instructions (Signed)
 Description   HOLD today's dose and then START taking warfarin 1 TABLET DAILY EXCEPT 1/2 TABLET ON SUNDAY, TUESDAYS and THURSDAY.  Continue eating 2 serving of greens per week plus your one day of brussels sprouts.  Recheck INR in 1 week.  Coumadin Clinic [phone]

## 2023-12-14 ENCOUNTER — Ambulatory Visit: Attending: Cardiology

## 2023-12-14 DIAGNOSIS — Z7901 Long term (current) use of anticoagulants: Secondary | ICD-10-CM

## 2023-12-14 DIAGNOSIS — I480 Paroxysmal atrial fibrillation: Secondary | ICD-10-CM

## 2023-12-14 LAB — POCT INR: INR: 3.4 — AB (ref 2.0–3.0)

## 2023-12-14 NOTE — Patient Instructions (Addendum)
 Description   HOLD today's dose and then continue taking warfarin 1 TABLET DAILY EXCEPT 1/2 TABLET ON SUNDAY, TUESDAYS and THURSDAY.  Continue eating 2 serving of greens per week plus your one day of brussels sprouts.  Recheck INR in 2 weeks.  Coumadin Clinic [phone]

## 2023-12-22 ENCOUNTER — Other Ambulatory Visit: Payer: Self-pay | Admitting: Family

## 2023-12-22 DIAGNOSIS — I10 Essential (primary) hypertension: Secondary | ICD-10-CM

## 2023-12-27 ENCOUNTER — Encounter: Payer: Self-pay | Admitting: Family

## 2023-12-27 ENCOUNTER — Ambulatory Visit (INDEPENDENT_AMBULATORY_CARE_PROVIDER_SITE_OTHER): Admitting: Family

## 2023-12-27 ENCOUNTER — Telehealth: Payer: Self-pay | Admitting: *Deleted

## 2023-12-27 VITALS — BP 126/77 | HR 60 | Temp 98.3°F | Resp 16 | Ht <= 58 in | Wt 191.0 lb

## 2023-12-27 DIAGNOSIS — I10 Essential (primary) hypertension: Secondary | ICD-10-CM | POA: Diagnosis not present

## 2023-12-27 MED ORDER — BISOPROLOL FUMARATE 10 MG PO TABS: 10.0000 mg | ORAL_TABLET | Freq: Every day | ORAL | 0 refills | Status: DC

## 2023-12-27 NOTE — Progress Notes (Signed)
 Patient ID: Tracey Todd, female    DOB: 01-Dec-1934  MRN: [id number]  CC: Chronic Conditions Follow-Up  Subjective: Tracey Todd is a 89 y.o. female who presents for chronic conditions follow-up.   Her concerns today include:  - Patient established with [facility] at [facility] [employer] and last office visit on 10/25/2023. Patient presents today for Bisoprolol  refills, no issues/concerns. Patient states she thought Primary Care was to refill Bisoprolol . She does not complain of red flag symptoms such as but not limited to chest pain, shortness of breath, worst headache of life, nausea/vomiting.  - Established with Endocrinology.  - States she received a letter from her health insurance but she isn't sure what it said. States she thinks letter may be related to denial of cortisone injections from Orthopedics. States she plans to send copy of letter to our office at later date for review.   Patient Active Problem List   Diagnosis Date Noted   Acute respiratory infection 09/13/2023   Nonrheumatic tricuspid valve regurgitation 12/03/2022   Coronary artery disease of native artery of native heart with stable angina pectoris (HCC) 12/03/2022   Atrial fibrillation (HCC) 08/11/2022   Hav (hallux abducto valgus), unspecified laterality 02/19/2022   Right ear impacted cerumen 11/06/2021   Chronic arthropathy 10/12/2019   Callus 10/12/2019   Long term (current) use of anticoagulants 10/02/2018   Atherosclerosis of aorta (HCC) 07/20/2017   Diabetic neuropathy (HCC) 07/20/2017   Lumbar radiculopathy 03/30/2017   Pain of both hip joints 03/30/2017   Pulmonary hypertension, unspecified (HCC) 11/20/2015   BMI 34.0-34.9,adult 08/06/2015   Diabetes mellitus type 2, controlled (HCC) 04/25/2015   Sick sinus syndrome (HCC) 01/29/2015   Pacemaker implanted 10/25/14- St Jude 10/26/2014   Essential hypertension 10/22/2014   PAF-fib and flutter 10/22/2014   (HFpEF) heart  failure with preserved ejection fraction (HCC) 10/21/2014   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 30 tablet 0   amLODipine  (NORVASC ) 5 MG tablet Take 1 tablet by mouth once daily 90 tablet 0   atorvastatin  (LIPITOR) 80 MG tablet Take 1 tablet by mouth once daily 90 tablet 2   Blood Glucose Monitoring Suppl (ONETOUCH VERIO) w/Device KIT Use to check fasting blood sugar daily. Dx: E11.59 1 kit 0   brimonidine (ALPHAGAN) 0.2 % ophthalmic solution Place 1 drop into both eyes 2 (two) times daily.     calcium  carbonate (OS-CAL) 600 MG TABS tablet Take 600 mg by mouth daily with breakfast.     Cholecalciferol (VITAMIN D ) 2000 UNITS CAPS Take 6,000 Units by mouth daily.      fluticasone  (FLONASE ) 50 MCG/ACT nasal spray Place 2 sprays into both nostrils 2 (two) times daily. 16 g 6   gabapentin  (NEURONTIN ) 300 MG capsule Take 1 capsule (300 mg total) by mouth 2 (two) times daily. 180 capsule 3   glucose blood (ONETOUCH VERIO) test strip USE TO CHECK FASTING BLOOD SUGAR TWICE DAILY 100 each 11   insulin  glargine (LANTUS SOLOSTAR) 100  UNIT/ML Solostar Pen Inject 12 Units into the skin at bedtime.     Lancets (ONETOUCH DELICA PLUS LANCET33G) MISC USE TO CHECK FASTING BLOOD SUGAR TWICE DAILY 100 each 2   Magnesium  500 MG TABS Take 1,000 mg by mouth in the morning and at bedtime. 2 tabs am, 2 tab pm     MM PEN NEEDLES 32G X 4 MM MISC      Semaglutide,0.25 or 0.5MG /DOS, (OZEMPIC, 0.25 OR 0.5 MG/DOSE,) 2 MG/3ML SOPN Inject 0.5 mg into the skin once a week.     spironolactone  (ALDACTONE ) 25 MG  tablet Take 1/2 (one-half) tablet by mouth once daily 45 tablet 1   torsemide  (DEMADEX ) 20 MG tablet Take 2 tablets (40 mg) by mouth daily in the mornings, Take 1 tablet (20 mg) by mouth on Monday, Wednesday, and Friday in the afternoon.     warfarin (COUMADIN ) 5 MG tablet TAKE 1 & 1/2 (ONE & ONE-HALF) TABLETS BY MOUTH ONCE DAILY AS DIRECTED BY  ANTICOAGULATION  CLINIC 40 tablet 3   cetirizine (ZYRTEC) 10 MG chewable tablet as needed for allergies. (Patient not taking: Reported on 12/27/2023)     dapagliflozin  propanediol (FARXIGA ) 10 MG TABS tablet Take 1 tablet (10 mg total) by mouth daily before breakfast. (Patient not taking: Reported on 12/27/2023) 90 tablet 3   oxyCODONE -acetaminophen  (PERCOCET/ROXICET) 5-325 MG tablet Take 1 tablet by mouth every 8 (eight) hours as needed for severe pain (pain score 7-10). (Patient not taking: Reported on 12/27/2023) 9 tablet 0   triamcinolone  (KENALOG ) 0.025 % cream Apply 1 Application topically 2 (two) times daily. (Patient not taking: Reported on 12/27/2023) 60 g 0   No current facility-administered medications on file prior to visit.    Allergies  Allergen Reactions   Penicillin G Anaphylaxis   Penicillins Anaphylaxis   Quinapril  Anaphylaxis    Angioedema   Other     Other Reaction(s): irritation of skin   Piroxicam Other (See Comments)    Bleeding  Other Reaction(s): blood in stool   Calcium -Containing Compounds Other (See Comments)    "calcium  deposits on skin"   Celecoxib Itching   Oxaprozin Itching and Other (See Comments)    Social History   Socioeconomic History   Marital status: Married    Spouse name: Not on file   Number of children: Not on file   Years of education: Not on file   Highest education level: Not on file  Occupational History   Not on file  Tobacco Use   Smoking status: Former    Current packs/day: 0.00    Types: Cigarettes    Quit date: 12/06/1963    Years since quitting: 60.0    Passive exposure: Never    Smokeless tobacco: Never  Vaping Use   Vaping status: Never Used  Substance and Sexual Activity   Alcohol use: No   Drug use: Never   Sexual activity: Not Currently  Other Topics Concern   Not on file  Social History Narrative   Left Handed    Lives in a one story home       Loss her husband 10/03/22   Social Drivers of Health   Financial Resource Strain: Low Risk  (05/28/2021)   Overall Financial Resource Strain (CARDIA)    Difficulty of Paying Living Expenses: Not very hard  Food Insecurity: No Food Insecurity (05/28/2021)   Hunger Vital Sign    Worried About Running Out of Food in the Last Year: Never true    Ran  Out of Food in the Last Year: Never true  Transportation Needs: No Transportation Needs (05/28/2021)   PRAPARE - Administrator, Civil Service (Medical): No    Lack of Transportation (Non-Medical): No  Physical Activity: Inactive (05/28/2021)   Exercise Vital Sign    Days of Exercise per Week: 0 days    Minutes of Exercise per Session: 0 min  Stress: No Stress Concern Present (05/28/2021)   [employer] of Occupational Health - Occupational Stress Questionnaire    Feeling of Stress : Not at all  Social Connections: Moderately Integrated (05/28/2021)   Social Connection and Isolation Panel [NHANES]    Frequency of Communication with Friends and Family: More than three times a week    Frequency of Social Gatherings with Friends and Family: More than three times a week    Attends Religious Services: More than 4 times per year    Active Member of [employer] or Organizations: No    Attends Banker Meetings: Never    Marital Status: Married  Catering manager Violence: Not At Risk (05/28/2021)   Humiliation, Afraid, Rape, and Kick questionnaire    Fear of Current or Ex-Partner: No    Emotionally Abused: No    Physically Abused: No    Sexually Abused: No    Family History  Problem Relation Age of Onset   Diabetes Mother    Cancer Mother     Cancer Father    Breast cancer Neg Hx     Past Surgical History:  Procedure Laterality Date   BREAST SURGERY     CATARACT EXTRACTION     LEFT AND RIGHT HEART CATHETERIZATION WITH CORONARY ANGIOGRAM N/A 10/23/2014   Procedure: LEFT AND RIGHT HEART CATHETERIZATION WITH CORONARY ANGIOGRAM;  Surgeon: Arlander Bellman, MD;  [facility] C[facility] LAB;  Service: Cardiovascular;  Laterality: N/A;   PERMANENT PACEMAKER INSERTION N/A 10/25/2014   STJ dual chamber PPM implanted by Dr Nunzio Belch for SSS   TONSILLECTOMY AND ADENOIDECTOMY     TOTAL KNEE ARTHROPLASTY      ROS: Review of Systems Negative except as stated above  PHYSICAL EXAM: BP 126/77   Pulse 60   Temp 98.3 F (36.8 C) (Oral)   Resp 16   Ht 4\' 10"  (1.473 m)   Wt 191 lb (86.6 kg)   SpO2 93%   BMI 39.92 kg/m   Physical Exam HENT:     Head: Normocephalic and atraumatic.     Nose: Nose normal.     Mouth/Throat:     Mouth: Mucous membranes are moist.     Pharynx: Oropharynx is clear.  Eyes:     Extraocular Movements: Extraocular movements intact.     Conjunctiva/sclera: Conjunctivae normal.     Pupils: Pupils are equal, round, and reactive to light.  Cardiovascular:     Rate and Rhythm: Normal rate and regular rhythm.     Pulses: Normal pulses.     Heart sounds: Normal heart sounds.  Pulmonary:     Effort: Pulmonary effort is normal.     Breath sounds: Normal breath sounds.  Musculoskeletal:        General: Normal range of motion.     Cervical back: Normal range of motion and neck supple.  Neurological:     General: No focal deficit present.     Mental Status: She is alert and oriented to person, place, and time.  Psychiatric:        Mood and Affect: Mood normal.  Behavior: Behavior normal.     ASSESSMENT AND PLAN: 1. Primary hypertension (Primary) - Continue Bisoprolol  as prescribed.  - Counseled on blood pressure goal of less than 130/80, low-sodium, DASH diet, medication compliance, and 150 minutes of  moderate intensity exercise per week as tolerated. Counseled on medication adherence and adverse effects. - Keep all scheduled appointments with Cardiology.  - Follow-up with primary provider as scheduled. - bisoprolol  (ZEBETA ) 10 MG tablet; Take 1 tablet (10 mg total) by mouth daily. for blood pressure  Dispense: 90 tablet; Refill: 0  Patient was given the opportunity to ask questions.  Patient verbalized understanding of the plan and was able to repeat key elements of the plan. Patient was given clear instructions to go to Emergency Department or return to medical center if symptoms don't improve, worsen, or new problems develop.The patient verbalized understanding.    Requested Prescriptions   Signed Prescriptions Disp Refills   bisoprolol  (ZEBETA ) 10 MG tablet 90 tablet 0    Sig: Take 1 tablet (10 mg total) by mouth daily. for blood pressure    Follow-up with primary provider as scheduled.  Senaida Dama, NP

## 2023-12-27 NOTE — Telephone Encounter (Signed)
 Called pt to remind that his Anticoagulation Appt is tomorrow at the new location: [address] inside the building; spoke with wife and she verbalized understanding.

## 2023-12-27 NOTE — Progress Notes (Signed)
 Medication refill, insurance denied the claim for shots in hip and they stated they were going to send PCP a letter

## 2023-12-28 ENCOUNTER — Ambulatory Visit: Attending: Cardiovascular Disease | Admitting: *Deleted

## 2023-12-28 DIAGNOSIS — I480 Paroxysmal atrial fibrillation: Secondary | ICD-10-CM | POA: Diagnosis not present

## 2023-12-28 DIAGNOSIS — Z7901 Long term (current) use of anticoagulants: Secondary | ICD-10-CM

## 2023-12-28 LAB — POCT INR: INR: 3.1 — AB (ref 2.0–3.0)

## 2023-12-28 NOTE — Patient Instructions (Signed)
 Description   Today take 1/2 tablet of warfarin then continue taking warfarin 1 TABLET DAILY EXCEPT 1/2 TABLET ON SUNDAY, TUESDAYS and THURSDAY.  Continue eating 2 serving of greens per week plus your one day of brussels sprouts.  Recheck INR in 2 weeks.  Coumadin  Clinic [phone]

## 2023-12-29 ENCOUNTER — Telehealth: Payer: Self-pay

## 2023-12-29 DIAGNOSIS — S22078A Other fracture of T9-T10 vertebra, initial encounter for closed fracture: Secondary | ICD-10-CM | POA: Diagnosis not present

## 2023-12-29 DIAGNOSIS — E119 Type 2 diabetes mellitus without complications: Secondary | ICD-10-CM | POA: Diagnosis not present

## 2023-12-29 DIAGNOSIS — S22078D Other fracture of T9-T10 vertebra, subsequent encounter for fracture with routine healing: Secondary | ICD-10-CM | POA: Diagnosis not present

## 2023-12-29 NOTE — Telephone Encounter (Signed)
 Received a fax notice in the [facility] Patient Assistance [employer] que that shipment should arrive within 7-10 business days (see chart media).

## 2024-01-04 NOTE — Progress Notes (Signed)
 Remote pacemaker transmission.

## 2024-01-09 ENCOUNTER — Other Ambulatory Visit: Payer: Self-pay | Admitting: Internal Medicine

## 2024-01-09 DIAGNOSIS — Z7901 Long term (current) use of anticoagulants: Secondary | ICD-10-CM

## 2024-01-09 DIAGNOSIS — I480 Paroxysmal atrial fibrillation: Secondary | ICD-10-CM

## 2024-01-09 DIAGNOSIS — E1165 Type 2 diabetes mellitus with hyperglycemia: Secondary | ICD-10-CM | POA: Diagnosis not present

## 2024-01-10 ENCOUNTER — Ambulatory Visit: Attending: Cardiovascular Disease | Admitting: *Deleted

## 2024-01-10 DIAGNOSIS — Z7901 Long term (current) use of anticoagulants: Secondary | ICD-10-CM | POA: Diagnosis not present

## 2024-01-10 DIAGNOSIS — I480 Paroxysmal atrial fibrillation: Secondary | ICD-10-CM

## 2024-01-10 LAB — POCT INR: INR: 2.9 (ref 2.0–3.0)

## 2024-01-10 NOTE — Patient Instructions (Signed)
 Description   Continue taking warfarin 1 TABLET DAILY EXCEPT 1/2 TABLET ON SUNDAY, TUESDAYS and THURSDAY.  Continue eating 2 serving of greens per week plus your one day of brussels sprouts.  Recheck INR in 3 weeks.  Coumadin  Clinic [phone]

## 2024-01-16 DIAGNOSIS — Z7901 Long term (current) use of anticoagulants: Secondary | ICD-10-CM | POA: Diagnosis not present

## 2024-01-16 DIAGNOSIS — D6869 Other thrombophilia: Secondary | ICD-10-CM | POA: Diagnosis not present

## 2024-01-17 ENCOUNTER — Other Ambulatory Visit (HOSPITAL_COMMUNITY): Payer: Self-pay | Admitting: Student

## 2024-01-17 DIAGNOSIS — E785 Hyperlipidemia, unspecified: Secondary | ICD-10-CM | POA: Diagnosis not present

## 2024-01-17 DIAGNOSIS — I10 Essential (primary) hypertension: Secondary | ICD-10-CM | POA: Diagnosis not present

## 2024-01-17 DIAGNOSIS — N1832 Chronic kidney disease, stage 3b: Secondary | ICD-10-CM | POA: Diagnosis not present

## 2024-01-17 DIAGNOSIS — E1165 Type 2 diabetes mellitus with hyperglycemia: Secondary | ICD-10-CM | POA: Diagnosis not present

## 2024-01-17 DIAGNOSIS — S22078D Other fracture of T9-T10 vertebra, subsequent encounter for fracture with routine healing: Secondary | ICD-10-CM

## 2024-01-25 DIAGNOSIS — H401131 Primary open-angle glaucoma, bilateral, mild stage: Secondary | ICD-10-CM | POA: Diagnosis not present

## 2024-01-26 ENCOUNTER — Ambulatory Visit (INDEPENDENT_AMBULATORY_CARE_PROVIDER_SITE_OTHER): Payer: Medicare Other | Admitting: Audiology

## 2024-01-27 ENCOUNTER — Ambulatory Visit (HOSPITAL_COMMUNITY)
Admission: RE | Admit: 2024-01-27 | Discharge: 2024-01-27 | Disposition: A | Discharge status: Home / Self Care | Source: Ambulatory Visit | Attending: Student | Admitting: Student

## 2024-01-27 ENCOUNTER — Encounter (HOSPITAL_COMMUNITY): Payer: Self-pay

## 2024-01-27 DIAGNOSIS — S22078D Other fracture of T9-T10 vertebra, subsequent encounter for fracture with routine healing: Secondary | ICD-10-CM

## 2024-01-29 DIAGNOSIS — E119 Type 2 diabetes mellitus without complications: Secondary | ICD-10-CM | POA: Diagnosis not present

## 2024-01-30 ENCOUNTER — Ambulatory Visit (HOSPITAL_COMMUNITY)
Admission: RE | Admit: 2024-01-30 | Discharge: 2024-01-30 | Disposition: A | Discharge status: Home / Self Care | Source: Ambulatory Visit | Attending: Student | Admitting: Student

## 2024-01-30 ENCOUNTER — Telehealth: Payer: Self-pay | Admitting: Family

## 2024-01-30 DIAGNOSIS — Z950 Presence of cardiac pacemaker: Secondary | ICD-10-CM | POA: Diagnosis not present

## 2024-01-30 DIAGNOSIS — S22079D Unspecified fracture of T9-T10 vertebra, subsequent encounter for fracture with routine healing: Secondary | ICD-10-CM | POA: Diagnosis not present

## 2024-01-30 DIAGNOSIS — S22078D Other fracture of T9-T10 vertebra, subsequent encounter for fracture with routine healing: Secondary | ICD-10-CM | POA: Insufficient documentation

## 2024-01-30 DIAGNOSIS — Z0000 Encounter for general adult medical examination without abnormal findings: Secondary | ICD-10-CM | POA: Diagnosis not present

## 2024-01-30 DIAGNOSIS — I10 Essential (primary) hypertension: Secondary | ICD-10-CM | POA: Diagnosis not present

## 2024-01-30 DIAGNOSIS — M4804 Spinal stenosis, thoracic region: Secondary | ICD-10-CM | POA: Diagnosis not present

## 2024-01-30 DIAGNOSIS — I509 Heart failure, unspecified: Secondary | ICD-10-CM | POA: Diagnosis not present

## 2024-01-30 DIAGNOSIS — I517 Cardiomegaly: Secondary | ICD-10-CM | POA: Diagnosis not present

## 2024-01-30 DIAGNOSIS — E119 Type 2 diabetes mellitus without complications: Secondary | ICD-10-CM | POA: Diagnosis not present

## 2024-01-30 DIAGNOSIS — I495 Sick sinus syndrome: Secondary | ICD-10-CM | POA: Diagnosis not present

## 2024-01-30 DIAGNOSIS — E785 Hyperlipidemia, unspecified: Secondary | ICD-10-CM | POA: Diagnosis not present

## 2024-01-30 NOTE — Telephone Encounter (Signed)
 Patient was identified as falling into the [place] Measure - Diabetes.   Patient was: Left voicemail to schedule with primary care provider.  For A1c recheck

## 2024-01-31 ENCOUNTER — Ambulatory Visit (INDEPENDENT_AMBULATORY_CARE_PROVIDER_SITE_OTHER): Payer: Medicare Other

## 2024-02-01 ENCOUNTER — Ambulatory Visit (INDEPENDENT_AMBULATORY_CARE_PROVIDER_SITE_OTHER): Payer: Medicare Other | Admitting: Otolaryngology

## 2024-02-02 ENCOUNTER — Ambulatory Visit: Attending: Cardiology

## 2024-02-02 DIAGNOSIS — I480 Paroxysmal atrial fibrillation: Secondary | ICD-10-CM

## 2024-02-02 DIAGNOSIS — Z7901 Long term (current) use of anticoagulants: Secondary | ICD-10-CM | POA: Diagnosis not present

## 2024-02-02 LAB — POCT INR: INR: 2.5 (ref 2.0–3.0)

## 2024-02-02 NOTE — Patient Instructions (Addendum)
 Description   Continue taking warfarin 1 TABLET DAILY EXCEPT 1/2 TABLET ON SUNDAY, TUESDAYS and THURSDAY.  Continue eating 2 serving of greens per week plus your one day of brussels sprouts.  Recheck INR in 4 weeks.  Coumadin  Clinic [phone]

## 2024-02-15 ENCOUNTER — Other Ambulatory Visit: Payer: Self-pay | Admitting: Family

## 2024-02-15 DIAGNOSIS — I10 Essential (primary) hypertension: Secondary | ICD-10-CM

## 2024-02-22 ENCOUNTER — Ambulatory Visit: Admitting: Podiatry

## 2024-02-22 ENCOUNTER — Encounter: Payer: Self-pay | Admitting: Podiatry

## 2024-02-22 DIAGNOSIS — B351 Tinea unguium: Secondary | ICD-10-CM | POA: Diagnosis not present

## 2024-02-22 DIAGNOSIS — E1142 Type 2 diabetes mellitus with diabetic polyneuropathy: Secondary | ICD-10-CM

## 2024-02-22 DIAGNOSIS — M79676 Pain in unspecified toe(s): Secondary | ICD-10-CM

## 2024-02-22 NOTE — Progress Notes (Signed)
This patient returns to my office for at risk foot care.  This patient requires this care by a professional since this patient will be at risk due to having diabetes and coagulation defect.  Patient is taking coumadin.  This patient is unable to cut nails herself since the patient cannot reach her nails.These nails are painful walking and wearing shoes.  This patient presents for at risk foot care today.  General Appearance  Alert, conversant and in no acute stress.  Vascular  Dorsalis pedis and posterior tibial  pulses are weakly  palpable  bilaterally.  Capillary return is within normal limits  bilaterally. Temperature is within normal limits  Bilaterally. Absent hair.  Neurologic  Senn-Weinstein monofilament wire test within normal limits  bilaterally. Muscle power within normal limits bilaterally.  Nails Thick disfigured discolored nails with subungual debris  from hallux to fifth toes bilaterally. No evidence of bacterial infection or drainage bilaterally.  Orthopedic  No limitations of motion  feet .  No crepitus or effusions noted.  No bony pathology or digital deformities noted.  Skin  normotropic skin with no porokeratosis noted bilaterally.  No signs of infections or ulcers noted.     Onychomycosis  Pain in right toes  Pain in left toes  Consent was obtained for treatment procedures.   Mechanical debridement of nails 1-5  bilaterally performed with a nail nipper. No dremel usage.   Return office visit   3 months                   Told patient to return for periodic foot care and evaluation due to potential at risk complications.   Helane Gunther DPM

## 2024-02-23 ENCOUNTER — Ambulatory Visit (INDEPENDENT_AMBULATORY_CARE_PROVIDER_SITE_OTHER): Payer: Medicare Other

## 2024-02-23 ENCOUNTER — Telehealth: Payer: Self-pay | Admitting: Internal Medicine

## 2024-02-23 DIAGNOSIS — I495 Sick sinus syndrome: Secondary | ICD-10-CM

## 2024-02-23 LAB — CUP PACEART REMOTE DEVICE CHECK
Battery Remaining Longevity: 12 mo
Battery Remaining Percentage: 11 %
Battery Voltage: 2.84 V
Brady Statistic AP VP Percent: 97 %
Brady Statistic AP VS Percent: 1 %
Brady Statistic AS VP Percent: 2.2 %
Brady Statistic AS VS Percent: 1 %
Brady Statistic RA Percent Paced: 42 %
Brady Statistic RV Percent Paced: 99 %
Date Time Interrogation Session: 20250626044758
Implantable Lead Connection Status: 753985
Implantable Lead Connection Status: 753985
Implantable Lead Implant Date: 20160226
Implantable Lead Implant Date: 20160226
Implantable Lead Location: 753859
Implantable Lead Location: 753860
Implantable Lead Model: 1948
Implantable Pulse Generator Implant Date: 20160226
Lead Channel Impedance Value: 430 Ohm
Lead Channel Impedance Value: 540 Ohm
Lead Channel Pacing Threshold Amplitude: 0.5 V
Lead Channel Pacing Threshold Amplitude: 0.625 V
Lead Channel Pacing Threshold Pulse Width: 0.4 ms
Lead Channel Pacing Threshold Pulse Width: 0.4 ms
Lead Channel Sensing Intrinsic Amplitude: 3.2 mV
Lead Channel Sensing Intrinsic Amplitude: 8.5 mV
Lead Channel Setting Pacing Amplitude: 2.5 V
Lead Channel Setting Pacing Amplitude: 2.5 V
Lead Channel Setting Pacing Pulse Width: 0.4 ms
Lead Channel Setting Sensing Sensitivity: 2 mV
Pulse Gen Model: 2240
Pulse Gen Serial Number: 7700661

## 2024-02-23 MED ORDER — SPIRONOLACTONE 25 MG PO TABS: 12.5000 mg | ORAL_TABLET | Freq: Every day | ORAL | 1 refills | Status: DC

## 2024-02-23 NOTE — Telephone Encounter (Signed)
*  STAT* If patient is at the pharmacy, call can be transferred to refill team.   1. Which medications need to be refilled? (please list name of each medication and dose if known)  spironolactone  (ALDACTONE ) 25 MG tablet    2. Would you like to learn more about the convenience, safety, & potential cost savings by using the [facility]?      3. Are you open to using the [facility] (Type [facility].  ).   4. Which pharmacy/location (including street and city if local pharmacy) is medication to be sent to?  [facility] [address] [place], [place] - [address]     5. Do they need a 30 day or 90 day supply? 90 Patient only has 4 pills left

## 2024-02-23 NOTE — Telephone Encounter (Signed)
 Pt's medication was sent to pt's pharmacy as requested. Confirmation received.

## 2024-02-24 ENCOUNTER — Telehealth: Payer: Self-pay | Admitting: Pharmacy Technician

## 2024-02-24 NOTE — Telephone Encounter (Signed)
 Received notification that Farxiga  has been shipped:

## 2024-02-26 ENCOUNTER — Ambulatory Visit: Payer: Self-pay | Admitting: Cardiology

## 2024-02-28 ENCOUNTER — Ambulatory Visit (HOSPITAL_COMMUNITY)
Admission: RE | Admit: 2024-02-28 | Discharge: 2024-02-28 | Disposition: A | Discharge status: Home / Self Care | Payer: Medicare Other | Source: Ambulatory Visit | Attending: Cardiology | Admitting: Cardiology

## 2024-02-28 DIAGNOSIS — I361 Nonrheumatic tricuspid (valve) insufficiency: Secondary | ICD-10-CM

## 2024-02-28 DIAGNOSIS — I5032 Chronic diastolic (congestive) heart failure: Secondary | ICD-10-CM

## 2024-02-28 DIAGNOSIS — I25118 Atherosclerotic heart disease of native coronary artery with other forms of angina pectoris: Secondary | ICD-10-CM

## 2024-02-28 DIAGNOSIS — E119 Type 2 diabetes mellitus without complications: Secondary | ICD-10-CM | POA: Diagnosis not present

## 2024-02-28 LAB — ECHOCARDIOGRAM COMPLETE
Area-P 1/2: 2.91 cm2
S' Lateral: 2.7 cm

## 2024-02-29 ENCOUNTER — Ambulatory Visit: Payer: Self-pay | Admitting: Internal Medicine

## 2024-02-29 NOTE — Progress Notes (Signed)
Please see anticoagulation encounter.

## 2024-03-01 ENCOUNTER — Ambulatory Visit: Attending: Cardiology | Admitting: Pharmacist

## 2024-03-01 ENCOUNTER — Other Ambulatory Visit: Payer: Self-pay | Admitting: Neurology

## 2024-03-01 DIAGNOSIS — Z7901 Long term (current) use of anticoagulants: Secondary | ICD-10-CM

## 2024-03-01 DIAGNOSIS — M48062 Spinal stenosis, lumbar region with neurogenic claudication: Secondary | ICD-10-CM

## 2024-03-01 DIAGNOSIS — I480 Paroxysmal atrial fibrillation: Secondary | ICD-10-CM

## 2024-03-01 DIAGNOSIS — E1142 Type 2 diabetes mellitus with diabetic polyneuropathy: Secondary | ICD-10-CM

## 2024-03-01 LAB — POCT INR: INR: 1.9 — AB (ref 2.0–3.0)

## 2024-03-01 NOTE — Patient Instructions (Signed)
 Description   Take a whole tablet today and then continue taking warfarin 1 TABLET DAILY EXCEPT 1/2 TABLET ON SUNDAY, TUESDAYS and THURSDAY.  Continue eating 2 serving of greens per week plus your one day of brussels sprouts.  Recheck INR in 4 weeks.  Coumadin  Clinic [phone]

## 2024-03-01 NOTE — Progress Notes (Signed)
Please see anticoagulation encounter.

## 2024-03-05 ENCOUNTER — Other Ambulatory Visit: Payer: Self-pay | Admitting: Neurology

## 2024-03-05 DIAGNOSIS — E1142 Type 2 diabetes mellitus with diabetic polyneuropathy: Secondary | ICD-10-CM

## 2024-03-05 DIAGNOSIS — M48062 Spinal stenosis, lumbar region with neurogenic claudication: Secondary | ICD-10-CM

## 2024-03-23 ENCOUNTER — Other Ambulatory Visit: Payer: Self-pay | Admitting: Family

## 2024-03-23 DIAGNOSIS — I10 Essential (primary) hypertension: Secondary | ICD-10-CM

## 2024-03-23 NOTE — Telephone Encounter (Signed)
 Complete

## 2024-03-27 ENCOUNTER — Ambulatory Visit: Attending: Internal Medicine | Admitting: *Deleted

## 2024-03-27 DIAGNOSIS — Z7901 Long term (current) use of anticoagulants: Secondary | ICD-10-CM

## 2024-03-27 DIAGNOSIS — I480 Paroxysmal atrial fibrillation: Secondary | ICD-10-CM

## 2024-03-27 LAB — POCT INR: INR: 2.8 (ref 2.0–3.0)

## 2024-03-27 NOTE — Progress Notes (Signed)
 INR-2.8 Please see anticoagulation encounter

## 2024-03-27 NOTE — Patient Instructions (Addendum)
 Description   Continue taking warfarin 1 TABLET DAILY EXCEPT 1/2 TABLET ON SUNDAYS, TUESDAYS and THURSDAYS.  Continue eating 2 serving of greens per week plus your one day of brussels sprouts. Recheck INR in 4 weeks.  Coumadin  Clinic [phone]

## 2024-03-28 ENCOUNTER — Telehealth: Payer: Self-pay | Admitting: Internal Medicine

## 2024-03-28 NOTE — Telephone Encounter (Signed)
 Called pt advised of MD response: Nursing documentation reviewed.  They describe: - HF exacerbation related to dietary indiscretion complicated by low blood pressure (asymptomatic) - Recommend: Increase torsemide  dose to 60 mg for 3 days; patient is on MRA for potassium, given hypotension, stop norvasc  5 mg.  Elevation, limiting sodium intake are advised.  Compression can be appropriate. - needs f/u with my team in August; goal is to avoid hospitalization - ED precautions for SOB are appropriate    Pt reports BP runs typically 110-120/50-60.  Denies SOB. Pt verbalizes understanding pt reports will call back next week with an update on status.  Scheduled OV 04/13/24 at 3:35 pm.   [facility] to stop amlodipine .

## 2024-03-28 NOTE — Telephone Encounter (Signed)
 Spoke with pt regarding her swelling. Pt stated her lower legs are both swollen. Pt stated they are not read or painful. The pt stated that in May she weighed about 183. Her weight now is 191. Pt stated the weight gain was gradual. Pt admitted to eating deli meat and processed foods. Pt stated she is taking her medications as prescribed including spironolactone  and torsemide . Pt stated that her blood pressure this morning was 98/65. Pt stated this is a normal reading for her in the morning. Pt did not have any other blood pressure readings to share. Pt was told to avoid processed foods and deli meat and to elevate her legs. Pt was given ED precautions. Pt told that if she begins to have chest pain or shortness of breath that she would need to go to the ED. Pt was told that the information she provided would be sent to Dr. Santo and his nurse for their suggestions. Pt verbalized understanding. All questions if any were answered.

## 2024-03-28 NOTE — Telephone Encounter (Signed)
 Pt c/o swelling/edema: STAT if pt has developed SOB within 24 hours  If swelling, where is the swelling located? Ankles   How much weight have you gained and in what time span? No states she is still 191   Have you gained 2 pounds in a day or 5 pounds in a week? No   Do you have a log of your daily weights (if so, list)? No   Are you currently taking a fluid pill? Yes   Are you currently SOB? No   Have you traveled recently in a car or plane for an extended period of time? No

## 2024-03-30 DIAGNOSIS — E119 Type 2 diabetes mellitus without complications: Secondary | ICD-10-CM | POA: Diagnosis not present

## 2024-04-02 NOTE — Telephone Encounter (Signed)
 Pt states that she was told to c/b and f/u with nurse today regarding swelling. Please advise

## 2024-04-02 NOTE — Telephone Encounter (Signed)
 Spoke with pt, her weight is the same but she reports the top of her stomach is not as bloated. She still will get swelling in her feet by the end of the day. She is wearing her compression hose. Her blood pressure this morning was 100. Aware will forward for dr santo to review.

## 2024-04-03 NOTE — Telephone Encounter (Signed)
Spoke with pt, aware of the recommendations.  

## 2024-04-04 DIAGNOSIS — R21 Rash and other nonspecific skin eruption: Secondary | ICD-10-CM | POA: Diagnosis not present

## 2024-04-13 ENCOUNTER — Ambulatory Visit: Attending: Physician Assistant | Admitting: Physician Assistant

## 2024-04-13 VITALS — BP 117/69 | HR 60 | Ht 59.0 in | Wt 191.0 lb

## 2024-04-13 DIAGNOSIS — R609 Edema, unspecified: Secondary | ICD-10-CM

## 2024-04-13 DIAGNOSIS — I480 Paroxysmal atrial fibrillation: Secondary | ICD-10-CM

## 2024-04-13 DIAGNOSIS — I25118 Atherosclerotic heart disease of native coronary artery with other forms of angina pectoris: Secondary | ICD-10-CM

## 2024-04-13 DIAGNOSIS — I5032 Chronic diastolic (congestive) heart failure: Secondary | ICD-10-CM

## 2024-04-13 NOTE — Patient Instructions (Addendum)
 Medication Instructions:  Torsemide  take 40 mg in the morning and 20 mg in the afternoon for 1 week, then resume 20 mg daily  *If you need a refill on your cardiac medications before your next appointment, please call your pharmacy*  Lab Work: BNP, BMET today and repeat lab work in 1 week  Testing/Procedures: NONE ordered at this time of appointment   Follow-Up: At [facility], you and your health needs are our priority.  As part of our continuing mission to provide you with exceptional heart care, our providers are all part of one team.  This team includes your primary Cardiologist (physician) and Advanced Practice Providers or APPs (Physician Assistants and Nurse Practitioners) who all work together to provide you with the care you need, when you need it.  Your next appointment:   2 week(s)  Provider:   Any APP    We recommend signing up for the patient portal called MyChart.  Sign up information is provided on this After Visit Summary.  MyChart is used to connect with patients for Virtual Visits (Telemedicine).  Patients are able to view lab/test results, encounter notes, upcoming appointments, etc.  Non-urgent messages can be sent to your provider as well.   To learn more about what you can do with MyChart, go to [email or web address].   Other Instructions Daily Weight Record It is important to weigh yourself daily. To do this: Make sure you use a reliable scale. Use the same scale each day. Keep this daily weight chart near your scale. Weigh yourself each morning at the same time after you use the bathroom. Before weighing yourself: Take off your shoes. Make sure you are wearing the same amount of clothing each day. Write down your weight in the spaces on the form. Compare today's weight to yesterday's weight. Bring this form with you to your follow-up visits with your health care provider. Call your health care provider if you have concerns about your weight,  including rapid weight gain or loss. Date: ________ Weight: ____________________ Date: ________ Weight: ____________________ Date: ________ Weight: ____________________ Date: ________ Weight: ____________________ Date: ________ Weight: ____________________ Date: ________ Weight: ____________________ Date: ________ Weight: ____________________ Date: ________ Weight: ____________________ Date: ________ Weight: ____________________ Date: ________ Weight: ____________________ Date: ________ Weight: ____________________ Date: ________ Weight: ____________________ Date: ________ Weight: ____________________ Date: ________ Weight: ____________________ Date: ________ Weight: ____________________ Date: ________ Weight: ____________________ Date: ________ Weight: ____________________ Date: ________ Weight: ____________________ Date: ________ Weight: ____________________ Date: ________ Weight: ____________________ Date: ________ Weight: ____________________ Date: ________ Weight: ____________________ Date: ________ Weight: ____________________ Date: ________ Weight: ____________________ Date: ________ Weight: ____________________ Date: ________ Weight: ____________________ Date: ________ Weight: ____________________ Date: ________ Weight: ____________________ Date: ________ Weight: ____________________ Date: ________ Weight: ____________________ Date: ________ Weight: ____________________ Date: ________ Weight: ____________________ Date: ________ Weight: ____________________ Date: ________ Weight: ____________________ Date: ________ Weight: ____________________ Date: ________ Weight: ____________________ Date: ________ Weight: ____________________ Date: ________ Weight: ____________________ Date: ________ Weight: ____________________ Date: ________ Weight: ____________________ Date: ________ Weight: ____________________ Date: ________ Weight: ____________________ Date: ________ Weight:  ____________________ Date: ________ Weight: ____________________ Date: ________ Weight: ____________________ Date: ________ Weight: ____________________ Date: ________ Weight: ____________________ Date: ________ Weight: ____________________ Date: ________ Weight: ____________________ Date: ________ Weight: ____________________ This information is not intended to replace advice given to you by your health care provider. Make sure you discuss any questions you have with your health care provider. Document Revised: 04/21/2021 Document Reviewed: 04/21/2021 Elsevier Patient Education  2024 [employer].

## 2024-04-13 NOTE — Progress Notes (Signed)
 Cardiology Office Note   Date:  04/13/2024  ID:  Tracey Todd, DOB 1934/11/09, MRN [id number] PCP: Ricky Alfrieda DASEN, DO  [facility] Providers Cardiologist:  Stanly DELENA Leavens, MD Electrophysiologist:  Soyla Gladis Norton, MD   History of Present Illness Tracey Todd is a 89 y.o. female with a past medical history of CAD (nonobstructive cath in 2016), persistent atrial fibrillation/flutter, SSS with a pacemaker, moderate to severe TR, HFpEF, pulmonary hypertension, hypertension, HLD, and diabetes mellitus here for follow-up appointment.  Today, she presents with diastolic dysfunction and tricuspid valve regurgitation with worsening edema and shortness of breath. She was referred by Dr. Leavens for follow-up on her cardiac condition and management of diuretic therapy.  She experiences worsening edema and shortness of breath. Her weight remains stable at 191 pounds over the past two weeks despite adjustments in her diuretic regimen. Previously, her weight was stable around 182 pounds, and there is puffiness around her eyelids.  An echocardiogram a month ago showed a moderate tricuspid valve leak, improved from the previous year, with normal heart pump function and an ejection fraction of 60-65%. She is on torasemide, 40 mg in the morning and 20 mg in the evening on Monday, Wednesday, and Friday. Her dose was recently increased to 60 mg daily for three days without significant change in weight or symptoms.  She was previously on amlodipine , which was stopped recently without noticeable changes in symptoms or blood pressure. She is also on spironolactone  and takes potassium supplements.  Reports no chest pain, pressure, or tightness. No orthopnea, PND. Reports no palpitations.   Discussed the use of AI scribe software for clinical note transcription with the patient, who gave verbal consent to proceed.   ROS: pertinent ROS in HPI  Studies Reviewed     Echo  02/28/24 IMPRESSIONS     1. Left ventricular ejection fraction, by estimation, is 60 to 65%. Left  ventricular ejection fraction by 3D volume is 61 %. The left ventricle has  normal function. The left ventricle has no regional wall motion  abnormalities. There is mild concentric  left ventricular hypertrophy. Left ventricular diastolic parameters are  consistent with Grade I diastolic dysfunction (impaired relaxation).   2. Right ventricular systolic function is normal. The right ventricular  size is mildly enlarged. There is mildly elevated pulmonary artery  systolic pressure. The estimated right ventricular systolic pressure is  40.2 mmHg.   3. The mitral valve is normal in structure. Trivial mitral valve  regurgitation.   4. Tricuspid valve regurgitation is mild to moderate.   5. The aortic valve is normal in structure. There is mild calcification  of the aortic valve. Aortic valve regurgitation is trivial.   Physical Exam VS:  BP 117/69   Pulse 60   Ht 4' 11 (1.499 m)   Wt 191 lb (86.6 kg)   SpO2 98%   BMI 38.58 kg/m        Wt Readings from Last 3 Encounters:  04/13/24 191 lb (86.6 kg)  12/27/23 191 lb (86.6 kg)  11/21/23 184 lb (83.5 kg)    GEN: Well nourished, well developed in no acute distress NECK: No JVD; No carotid bruits CARDIAC: RRR, no murmurs, rubs, gallops RESPIRATORY:  Clear to auscultation without rales, wheezing or rhonchi  ABDOMEN: Soft, non-tender, non-distended EXTREMITIES:  + petal edema (manifests more in the upper right leg which is normal for her); No deformity   ASSESSMENT AND PLAN  Diastolic dysfunction with peripheral edema Diastolic dysfunction  with peripheral edema, particularly in the left leg and upper thigh. Persistent swelling and shortness of breath noted. Current diuretic regimen may be insufficient due to potential tolerance. Amlodipine  discontinued without significant blood pressure change. - Increase torsemide  to 60 mg daily, 40 mg  in the morning and 20 mg in the afternoon for one week. - Order baseline labs today, including kidney function tests and BNP. - Repeat labs in one week to evaluate response to increased diuretic dose. -May need to increase to 40mg  in the morning and 40mg  in the afternoon if no response to increased dose - Instruct to weigh daily and maintain a low sodium diet. - Advise to elevate feet and use compression stockings if standing for prolonged periods. -continue Farxiga  10mg  daily, continue spirolactone 12.5mg  daily (last potassium 4.6) - Schedule follow-up in two weeks to reassess fluid status and adjust treatment.  Moderate tricuspid valve regurgitation Moderate tricuspid valve regurgitation improved compared to last year. No surgical intervention required. Normal heart pump function at 60-65%. - Schedule follow-up echocardiogram in one year.      Dispo: She can follow-up in 2 weeks with APP for fluid status check  Signed, Orren LOISE Fabry, PA-C

## 2024-04-14 ENCOUNTER — Other Ambulatory Visit: Payer: Self-pay | Admitting: Internal Medicine

## 2024-04-14 DIAGNOSIS — Z7901 Long term (current) use of anticoagulants: Secondary | ICD-10-CM

## 2024-04-14 DIAGNOSIS — I480 Paroxysmal atrial fibrillation: Secondary | ICD-10-CM

## 2024-04-15 ENCOUNTER — Ambulatory Visit: Payer: Self-pay | Admitting: Physician Assistant

## 2024-04-16 LAB — BRAIN NATRIURETIC PEPTIDE: BNP: 21.4 pg/mL (ref 0.0–100.0)

## 2024-04-16 LAB — BASIC METABOLIC PANEL WITH GFR
BUN/Creatinine Ratio: 29 — ABNORMAL HIGH (ref 12–28)
BUN: 32 mg/dL — ABNORMAL HIGH (ref 8–27)
CO2: 27 mmol/L (ref 20–29)
Calcium: 10 mg/dL (ref 8.7–10.3)
Chloride: 95 mmol/L — ABNORMAL LOW (ref 96–106)
Creatinine, Ser: 1.1 mg/dL — ABNORMAL HIGH (ref 0.57–1.00)
Glucose: 173 mg/dL — ABNORMAL HIGH (ref 70–99)
Potassium: 4.1 mmol/L (ref 3.5–5.2)
Sodium: 140 mmol/L (ref 134–144)
eGFR: 49 mL/min/1.73 — ABNORMAL LOW (ref >59)

## 2024-04-20 DIAGNOSIS — I5032 Chronic diastolic (congestive) heart failure: Secondary | ICD-10-CM | POA: Diagnosis not present

## 2024-04-20 DIAGNOSIS — R609 Edema, unspecified: Secondary | ICD-10-CM | POA: Diagnosis not present

## 2024-04-20 DIAGNOSIS — I480 Paroxysmal atrial fibrillation: Secondary | ICD-10-CM | POA: Diagnosis not present

## 2024-04-20 DIAGNOSIS — I25118 Atherosclerotic heart disease of native coronary artery with other forms of angina pectoris: Secondary | ICD-10-CM | POA: Diagnosis not present

## 2024-04-23 ENCOUNTER — Telehealth: Payer: Self-pay | Admitting: Pharmacy Technician

## 2024-04-23 LAB — BASIC METABOLIC PANEL WITH GFR
BUN/Creatinine Ratio: 33 — ABNORMAL HIGH (ref 12–28)
BUN: 37 mg/dL — ABNORMAL HIGH (ref 8–27)
CO2: 24 mmol/L (ref 20–29)
Calcium: 9.7 mg/dL (ref 8.7–10.3)
Chloride: 98 mmol/L (ref 96–106)
Creatinine, Ser: 1.11 mg/dL — ABNORMAL HIGH (ref 0.57–1.00)
Glucose: 192 mg/dL — ABNORMAL HIGH (ref 70–99)
Potassium: 4.2 mmol/L (ref 3.5–5.2)
Sodium: 142 mmol/L (ref 134–144)
eGFR: 48 mL/min/1.73 — ABNORMAL LOW (ref >59)

## 2024-04-23 LAB — BRAIN NATRIURETIC PEPTIDE: BNP: 28.4 pg/mL (ref 0.0–100.0)

## 2024-04-24 ENCOUNTER — Ambulatory Visit: Attending: Cardiology | Admitting: *Deleted

## 2024-04-24 DIAGNOSIS — I480 Paroxysmal atrial fibrillation: Secondary | ICD-10-CM | POA: Diagnosis not present

## 2024-04-24 DIAGNOSIS — Z7901 Long term (current) use of anticoagulants: Secondary | ICD-10-CM

## 2024-04-24 LAB — POCT INR: INR: 2.2 (ref 2.0–3.0)

## 2024-04-24 NOTE — Patient Instructions (Signed)
 Description   INR-2.2; Continue taking warfarin 1 TABLET DAILY EXCEPT 1/2 TABLET ON SUNDAYS, TUESDAYS and THURSDAYS.  Continue eating 2 serving of greens per week plus your one day of brussels sprouts. Recheck INR in 3 weeks-with Dr. Inocencio appt.  Coumadin  Clinic [phone]

## 2024-04-24 NOTE — Progress Notes (Signed)
 Description   INR-2.2; Continue taking warfarin 1 TABLET DAILY EXCEPT 1/2 TABLET ON SUNDAYS, TUESDAYS and THURSDAYS.  Continue eating 2 serving of greens per week plus your one day of brussels sprouts. Recheck INR in 3 weeks-with Dr. Inocencio appt.  Coumadin  Clinic [phone]

## 2024-04-30 DIAGNOSIS — E119 Type 2 diabetes mellitus without complications: Secondary | ICD-10-CM | POA: Diagnosis not present

## 2024-04-30 NOTE — Progress Notes (Unsigned)
 Cardiology Office Note   Date:  05/02/2024  ID:  Tracey Todd, DOB [birth year], MRN [id number] PCP: Ricky Alfrieda DASEN, DO  [facility] Providers Cardiologist:  Stanly DELENA Leavens, MD Electrophysiologist:  Soyla Gladis Norton, MD   History of Present Illness Tracey Todd is a 89 y.o. female with a past medical history of CAD (nonobstructive cath in 2016), persistent atrial fibrillation/flutter, SSS with a pacemaker, moderate to severe TR, HFpEF, pulmonary hypertension, hypertension, HLD, and diabetes mellitus here for follow-up appointment.  Today, she presents with diastolic dysfunction and tricuspid valve regurgitation with worsening edema and shortness of breath. She was referred by Dr. Leavens for follow-up on her cardiac condition and management of diuretic therapy.  She experiences worsening edema and shortness of breath. Her weight remains stable at 191 pounds over the past two weeks despite adjustments in her diuretic regimen. Previously, her weight was stable around 182 pounds, and there is puffiness around her eyelids.  An echocardiogram a month ago showed a moderate tricuspid valve leak, improved from the previous year, with normal heart pump function and an ejection fraction of 60-65%. She is on torasemide, 40 mg in the morning and 20 mg in the evening on Monday, Wednesday, and Friday. Her dose was recently increased to 60 mg daily for three days without significant change in weight or symptoms.  She was previously on amlodipine , which was stopped recently without noticeable changes in symptoms or blood pressure. She is also on spironolactone  and takes potassium supplements.  Reports no chest pain, pressure, or tightness. No orthopnea, PND. Reports no palpitations.   Discussed the use of AI scribe software for clinical note transcription with the patient, who gave verbal consent to proceed.  Today, she presents with diastolic heart failure for follow-up regarding  fluid management and weight monitoring.  She has experienced weight fluctuations, recently increasing to 194 pounds and then decreasing to 191 pounds, with a target weight of 185 pounds. She monitors her weight regularly. Her current medication regimen includes Demadex , 40 mg in the morning and 20 mg in the early afternoon. She experiences occasional shortness of breath and tightness, with no palpitations or chest discomfort. Blood pressure readings range from 98 mmHg to 120 mmHg. She is no longer on amlodipine  but continues bisoprolol . No dizziness or lightheadedness is present.  Reports no shortness of breath nor dyspnea on exertion. Reports no chest pain, pressure, or tightness. No edema, orthopnea, PND. Reports no palpitations.   Discussed the use of AI scribe software for clinical note transcription with the patient, who gave verbal consent to proceed.   ROS: pertinent ROS in HPI  Studies Reviewed     Echo 02/28/24 IMPRESSIONS     1. Left ventricular ejection fraction, by estimation, is 60 to 65%. Left  ventricular ejection fraction by 3D volume is 61 %. The left ventricle has  normal function. The left ventricle has no regional wall motion  abnormalities. There is mild concentric  left ventricular hypertrophy. Left ventricular diastolic parameters are  consistent with Grade I diastolic dysfunction (impaired relaxation).   2. Right ventricular systolic function is normal. The right ventricular  size is mildly enlarged. There is mildly elevated pulmonary artery  systolic pressure. The estimated right ventricular systolic pressure is  40.2 mmHg.   3. The mitral valve is normal in structure. Trivial mitral valve  regurgitation.   4. Tricuspid valve regurgitation is mild to moderate.   5. The aortic valve is normal in structure. There is mild  calcification  of the aortic valve. Aortic valve regurgitation is trivial.   Physical Exam VS:  BP (!) 100/58 (BP Location: Left Arm, Patient  Position: Sitting, Cuff Size: Large)   Resp 16   Ht 4' 11 (1.499 m)   Wt 191 lb 6.4 oz (86.8 kg)   SpO2 97%   BMI 38.66 kg/m        Wt Readings from Last 3 Encounters:  05/02/24 191 lb 6.4 oz (86.8 kg)  04/13/24 191 lb (86.6 kg)  12/27/23 191 lb (86.6 kg)    GEN: Well nourished, well developed in no acute distress NECK: No JVD; No carotid bruits CARDIAC: RRR, no murmurs, rubs, gallops RESPIRATORY:  Clear to auscultation without rales, wheezing or rhonchi  ABDOMEN: Soft, non-tender, non-distended EXTREMITIES:  no edema; No deformity   ASSESSMENT AND PLAN  Edema in the setting of diastolic heart failure management Weight fluctuated, BNP normal, kidney function stable on current diuretic regimen.  -continue Demadex  40mg  in the AM and 20mg  in the early afternoon with the option to add an extra 20mg  as needed - Renal function stable on recent labs, BNP normal -continue to monitor weight daily and BP  Rheumatic tricuspid insufficiency, stable post-imaging Recent imaging showed stable tricuspid valve condition with no new symptoms. - Plan follow-up imaging next year.  Atrial fibrillation, paroxysmal No new episodes of atrial fibrillation reported. Bisoprolol  controls heart rate and mildly affects blood pressure. - Continue current management with bisoprolol .  Hypertension with recent hypotension Blood pressure decreased, amlodipine  discontinued. Discussed hypotension risks. - Monitor blood pressure closely, especially if systolic readings are routinely in the 90s. - Report any symptoms of dizziness or lightheadedness.  Chronic musculoskeletal pain of the thigh Intermittent thigh pain, possibly musculoskeletal. Cortisone injections ineffective. Discussed benefits of light stretching. - Recommend light stretching exercises to alleviate muscle tightness.    Dispo: She can follow-up with Dr. Santo in 3 months  Signed, Orren LOISE Fabry, PA-C

## 2024-05-02 ENCOUNTER — Encounter: Payer: Self-pay | Admitting: Physician Assistant

## 2024-05-02 ENCOUNTER — Ambulatory Visit: Attending: Cardiovascular Disease | Admitting: Physician Assistant

## 2024-05-02 VITALS — BP 100/58 | Resp 16 | Ht 59.0 in | Wt 191.4 lb

## 2024-05-02 DIAGNOSIS — I071 Rheumatic tricuspid insufficiency: Secondary | ICD-10-CM

## 2024-05-02 DIAGNOSIS — R609 Edema, unspecified: Secondary | ICD-10-CM | POA: Diagnosis not present

## 2024-05-02 DIAGNOSIS — Z7901 Long term (current) use of anticoagulants: Secondary | ICD-10-CM

## 2024-05-02 DIAGNOSIS — I480 Paroxysmal atrial fibrillation: Secondary | ICD-10-CM | POA: Diagnosis not present

## 2024-05-02 DIAGNOSIS — I25118 Atherosclerotic heart disease of native coronary artery with other forms of angina pectoris: Secondary | ICD-10-CM

## 2024-05-02 NOTE — Patient Instructions (Signed)
 Medication Instructions:  Your physician recommends that you continue on your current medications as directed. Please refer to the Current Medication list given to you today. *If you need a refill on your cardiac medications before your next appointment, please call your pharmacy*  Lab Work: None ordered If you have labs (blood work) drawn today and your tests are completely normal, you will receive your results only by: MyChart Message (if you have MyChart) OR A paper copy in the mail If you have any lab test that is abnormal or we need to change your treatment, we will call you to review the results.  Testing/Procedures: None ordered  Follow-Up: At [facility], you and your health needs are our priority.  As part of our continuing mission to provide you with exceptional heart care, our providers are all part of one team.  This team includes your primary Cardiologist (physician) and Advanced Practice Providers or APPs (Physician Assistants and Nurse Practitioners) who all work together to provide you with the care you need, when you need it.  Your next appointment:   3 month(s)  Provider:   Stanly DELENA Leavens, MD    We recommend signing up for the patient portal called MyChart.  Sign up information is provided on this After Visit Summary.  MyChart is used to connect with patients for Virtual Visits (Telemedicine).  Patients are able to view lab/test results, encounter notes, upcoming appointments, etc.  Non-urgent messages can be sent to your provider as well.   To learn more about what you can do with MyChart, go to [email or web address].   Other Instructions CONTINUE CHECKING YOUR BLOOD PRESSURE AND WEIGHTS DAILY AND BRING READINGS TO YOUR NEXT APPOINTMENT

## 2024-05-03 ENCOUNTER — Telehealth: Payer: Self-pay | Admitting: Family Medicine

## 2024-05-03 NOTE — Telephone Encounter (Signed)
 Patient was identified as falling into the [place] Measure - Diabetes.   Patient was: Patient is not currently using our practice. New PCP: Tracey Todd

## 2024-05-03 NOTE — Telephone Encounter (Signed)
 Error

## 2024-05-09 DIAGNOSIS — B078 Other viral warts: Secondary | ICD-10-CM | POA: Diagnosis not present

## 2024-05-12 ENCOUNTER — Other Ambulatory Visit: Payer: Self-pay | Admitting: Internal Medicine

## 2024-05-12 DIAGNOSIS — I480 Paroxysmal atrial fibrillation: Secondary | ICD-10-CM

## 2024-05-12 DIAGNOSIS — Z7901 Long term (current) use of anticoagulants: Secondary | ICD-10-CM

## 2024-05-14 NOTE — Telephone Encounter (Signed)
 Refill request for warfarin:  Last INR was 2.2 on 04/24/24 Next INR due 05/15/24 LOV was 05/02/24  Refill approved.

## 2024-05-15 ENCOUNTER — Ambulatory Visit (INDEPENDENT_AMBULATORY_CARE_PROVIDER_SITE_OTHER): Admitting: *Deleted

## 2024-05-15 ENCOUNTER — Encounter: Payer: Self-pay | Admitting: Cardiology

## 2024-05-15 ENCOUNTER — Ambulatory Visit: Attending: Cardiology | Admitting: Cardiology

## 2024-05-15 VITALS — BP 111/67 | HR 60 | Ht 59.0 in | Wt 192.0 lb

## 2024-05-15 DIAGNOSIS — D6869 Other thrombophilia: Secondary | ICD-10-CM

## 2024-05-15 DIAGNOSIS — I495 Sick sinus syndrome: Secondary | ICD-10-CM | POA: Diagnosis not present

## 2024-05-15 DIAGNOSIS — I480 Paroxysmal atrial fibrillation: Secondary | ICD-10-CM

## 2024-05-15 DIAGNOSIS — Z7901 Long term (current) use of anticoagulants: Secondary | ICD-10-CM | POA: Diagnosis not present

## 2024-05-15 DIAGNOSIS — I4819 Other persistent atrial fibrillation: Secondary | ICD-10-CM | POA: Diagnosis not present

## 2024-05-15 LAB — CUP PACEART INCLINIC DEVICE CHECK
Battery Remaining Longevity: 10 mo
Battery Voltage: 2.83 V
Brady Statistic RA Percent Paced: 65 %
Brady Statistic RV Percent Paced: 99.53 %
Date Time Interrogation Session: 20250916162543
Implantable Lead Connection Status: 753985
Implantable Lead Connection Status: 753985
Implantable Lead Implant Date: 20160226
Implantable Lead Implant Date: 20160226
Implantable Lead Location: 753859
Implantable Lead Location: 753860
Implantable Lead Model: 1948
Implantable Pulse Generator Implant Date: 20160226
Lead Channel Impedance Value: 425 Ohm
Lead Channel Impedance Value: 562.5 Ohm
Lead Channel Pacing Threshold Amplitude: 0.75 V
Lead Channel Pacing Threshold Amplitude: 0.75 V
Lead Channel Pacing Threshold Amplitude: 0.75 V
Lead Channel Pacing Threshold Amplitude: 0.75 V
Lead Channel Pacing Threshold Pulse Width: 0.4 ms
Lead Channel Pacing Threshold Pulse Width: 0.4 ms
Lead Channel Pacing Threshold Pulse Width: 0.4 ms
Lead Channel Pacing Threshold Pulse Width: 0.4 ms
Lead Channel Sensing Intrinsic Amplitude: 11.5 mV
Lead Channel Sensing Intrinsic Amplitude: 4 mV
Lead Channel Setting Pacing Amplitude: 2.5 V
Lead Channel Setting Pacing Amplitude: 2.5 V
Lead Channel Setting Pacing Pulse Width: 0.4 ms
Lead Channel Setting Sensing Sensitivity: 2 mV
Pulse Gen Model: 2240
Pulse Gen Serial Number: 7700661

## 2024-05-15 LAB — POCT INR: INR: 3 (ref 2.0–3.0)

## 2024-05-15 NOTE — Progress Notes (Signed)
  Electrophysiology Office Note:   Date:  05/15/2024  ID:  Tracey Todd, DOB 1935-07-26, MRN [id number]  Primary Cardiologist: Stanly DELENA Leavens, MD Primary Heart Failure: None Electrophysiologist: Elaine Roanhorse Gladis Norton, MD      History of Present Illness:   Tracey Todd is a 89 y.o. female with h/o coronary disease, atrial fibrillation/flutter, sick sinus syndrome, diastolic heart failure, pulmonary hypertension (WHO class II) hypertension, hyperlipidemia, diabetes seen today for routine electrophysiology followup.   Since last being seen in our clinic the patient reports doing well.  She has no cardiac awareness.  She has no chest pain or shortness of breath.  She is unaware of any arrhythmias.  she denies chest pain, palpitations, dyspnea, PND, orthopnea, nausea, vomiting, dizziness, syncope, edema, weight gain, or early satiety.   Review of systems complete and found to be negative unless listed in HPI.      EP Information / Studies Reviewed:    EKG is not ordered today. EKG from 04/13/2024 reviewed which showed AV paced      PPM Interrogation-  reviewed in detail today,  See PACEART report.  Device History: Abbott Dual Chamber PPM implanted 10/25/2014 for Sinus Node Dysfunction  Risk Assessment/Calculations:    CHA2DS2-VASc Score = 7   This indicates a 11.2% annual risk of stroke. The patient's score is based upon: CHF History: 1 HTN History: 1 Diabetes History: 1 Stroke History: 0 Vascular Disease History: 1 Age Score: 2 Gender Score: 1             Physical Exam:   VS:  BP 111/67 (BP Location: Left Arm, Patient Position: Sitting, Cuff Size: Large)   Pulse 60   Ht 4' 11 (1.499 m)   Wt 192 lb (87.1 kg)   SpO2 98%   BMI 38.78 kg/m    Wt Readings from Last 3 Encounters:  05/15/24 192 lb (87.1 kg)  05/02/24 191 lb 6.4 oz (86.8 kg)  04/13/24 191 lb (86.6 kg)     GEN: Well nourished, well developed in no acute distress NECK: No JVD; No carotid  bruits CARDIAC: Regular rate and rhythm, no murmurs, rubs, gallops RESPIRATORY:  Clear to auscultation without rales, wheezing or rhonchi  ABDOMEN: Soft, non-tender, non-distended EXTREMITIES:  No edema; No deformity   ASSESSMENT AND PLAN:    SND s/p Abbott PPM  Normal PPM function See Pace Art report No changes today Battery is 10 months to ERI.  2.  Persistent atrial fibrillation: She is in sinus rhythm today.  Feeling well.  No acute complaints.  3.  Secondary hypercoagulable state: On warfarin  Disposition:   Follow up with EP APP 9 months   Signed, Bettina Warn Gladis Norton, MD

## 2024-05-15 NOTE — Patient Instructions (Addendum)
 Description   INR-3.0; Continue taking warfarin 1 TABLET DAILY EXCEPT 1/2 TABLET ON SUNDAYS, TUESDAYS and THURSDAYS.  Continue eating 2 serving of greens per week plus your one day of brussels sprouts. Recheck INR in 5 weeks.  Coumadin  Clinic [phone]

## 2024-05-15 NOTE — Progress Notes (Signed)
 Description   INR-3.0; Continue taking warfarin 1 TABLET DAILY EXCEPT 1/2 TABLET ON SUNDAYS, TUESDAYS and THURSDAYS.  Continue eating 2 serving of greens per week plus your one day of brussels sprouts. Recheck INR in 5 weeks.  Coumadin  Clinic [phone]

## 2024-05-23 ENCOUNTER — Telehealth: Payer: Self-pay

## 2024-05-23 NOTE — Telephone Encounter (Signed)
 Patient was identified as falling into the [place] Measure - Diabetes.   Patient was: Patient is not currently using our practice.

## 2024-05-24 ENCOUNTER — Encounter: Payer: Self-pay | Admitting: Podiatry

## 2024-05-24 ENCOUNTER — Ambulatory Visit: Admitting: Podiatry

## 2024-05-24 ENCOUNTER — Ambulatory Visit (INDEPENDENT_AMBULATORY_CARE_PROVIDER_SITE_OTHER): Payer: Medicare Other

## 2024-05-24 DIAGNOSIS — I495 Sick sinus syndrome: Secondary | ICD-10-CM | POA: Diagnosis not present

## 2024-05-24 DIAGNOSIS — M79676 Pain in unspecified toe(s): Secondary | ICD-10-CM

## 2024-05-24 DIAGNOSIS — E1142 Type 2 diabetes mellitus with diabetic polyneuropathy: Secondary | ICD-10-CM | POA: Diagnosis not present

## 2024-05-24 DIAGNOSIS — B351 Tinea unguium: Secondary | ICD-10-CM

## 2024-05-24 LAB — CUP PACEART REMOTE DEVICE CHECK
Battery Remaining Longevity: 10 mo
Battery Remaining Percentage: 10 %
Battery Voltage: 2.83 V
Brady Statistic AP VP Percent: 99 %
Brady Statistic AP VS Percent: 1 %
Brady Statistic AS VP Percent: 1 %
Brady Statistic AS VS Percent: 1 %
Brady Statistic RA Percent Paced: 98 %
Brady Statistic RV Percent Paced: 99 %
Date Time Interrogation Session: 20250925020015
Implantable Lead Connection Status: 753985
Implantable Lead Connection Status: 753985
Implantable Lead Implant Date: 20160226
Implantable Lead Implant Date: 20160226
Implantable Lead Location: 753859
Implantable Lead Location: 753860
Implantable Lead Model: 1948
Implantable Pulse Generator Implant Date: 20160226
Lead Channel Impedance Value: 390 Ohm
Lead Channel Impedance Value: 530 Ohm
Lead Channel Pacing Threshold Amplitude: 0.75 V
Lead Channel Pacing Threshold Amplitude: 0.75 V
Lead Channel Pacing Threshold Pulse Width: 0.4 ms
Lead Channel Pacing Threshold Pulse Width: 0.4 ms
Lead Channel Sensing Intrinsic Amplitude: 11.5 mV
Lead Channel Sensing Intrinsic Amplitude: 3 mV
Lead Channel Setting Pacing Amplitude: 2.5 V
Lead Channel Setting Pacing Amplitude: 2.5 V
Lead Channel Setting Pacing Pulse Width: 0.4 ms
Lead Channel Setting Sensing Sensitivity: 2 mV
Pulse Gen Model: 2240
Pulse Gen Serial Number: 7700661

## 2024-05-24 NOTE — Progress Notes (Signed)
 Remote pacemaker transmission.

## 2024-05-24 NOTE — Progress Notes (Signed)
 This patient returns to my office for at risk foot care.  This patient requires this care by a professional since this patient will be at risk due to having diabetes and coagulation defect.  Patient is taking coumadin .  This patient is unable to cut nails herself since the patient cannot reach her nails.These nails are painful walking and wearing shoes.  This patient presents for at risk foot care today.  General Appearance  Alert, conversant and in no acute stress.  Vascular  Dorsalis pedis and posterior tibial  pulses are weakly  palpable  bilaterally.  Capillary return is within normal limits  bilaterally. Temperature is within normal limits  Bilaterally. Absent hair.  Neurologic  Senn-Weinstein monofilament wire test within normal limits  bilaterally. Muscle power within normal limits bilaterally.  Nails Thick disfigured discolored nails with subungual debris  from hallux to fifth toes bilaterally. No evidence of bacterial infection or drainage bilaterally.  Orthopedic  No limitations of motion  feet .  No crepitus or effusions noted.  No bony pathology or digital deformities noted.  Skin  normotropic skin with no porokeratosis noted bilaterally.  No signs of infections or ulcers noted.     Onychomycosis  Pain in right toes  Pain in left toes  Consent was obtained for treatment procedures.   Mechanical debridement of nails 1-5  bilaterally performed with a nail nipper.  No dremel usage.   Return office visit   10 weeks                 Told patient to return for periodic foot care and evaluation due to potential at risk complications.   Cordella Bold DPM

## 2024-05-25 ENCOUNTER — Ambulatory Visit: Payer: Self-pay | Admitting: Cardiology

## 2024-05-28 DIAGNOSIS — I10 Essential (primary) hypertension: Secondary | ICD-10-CM | POA: Diagnosis not present

## 2024-05-28 DIAGNOSIS — E1165 Type 2 diabetes mellitus with hyperglycemia: Secondary | ICD-10-CM | POA: Diagnosis not present

## 2024-05-28 DIAGNOSIS — N1832 Chronic kidney disease, stage 3b: Secondary | ICD-10-CM | POA: Diagnosis not present

## 2024-05-28 DIAGNOSIS — E1141 Type 2 diabetes mellitus with diabetic mononeuropathy: Secondary | ICD-10-CM | POA: Diagnosis not present

## 2024-05-28 NOTE — Progress Notes (Signed)
 Remote PPM Transmission

## 2024-05-30 DIAGNOSIS — E119 Type 2 diabetes mellitus without complications: Secondary | ICD-10-CM | POA: Diagnosis not present

## 2024-06-04 ENCOUNTER — Telehealth: Payer: Self-pay | Admitting: Pharmacy Technician

## 2024-06-04 NOTE — Telephone Encounter (Signed)
 SABRA

## 2024-06-11 DIAGNOSIS — M25552 Pain in left hip: Secondary | ICD-10-CM | POA: Diagnosis not present

## 2024-06-11 DIAGNOSIS — Z96652 Presence of left artificial knee joint: Secondary | ICD-10-CM | POA: Diagnosis not present

## 2024-06-19 ENCOUNTER — Ambulatory Visit: Attending: Internal Medicine | Admitting: *Deleted

## 2024-06-19 DIAGNOSIS — I480 Paroxysmal atrial fibrillation: Secondary | ICD-10-CM | POA: Diagnosis not present

## 2024-06-19 DIAGNOSIS — Z7901 Long term (current) use of anticoagulants: Secondary | ICD-10-CM

## 2024-06-19 LAB — POCT INR: INR: 2.1 (ref 2.0–3.0)

## 2024-06-19 NOTE — Patient Instructions (Signed)
 Description   INR-2.1; Continue taking warfarin 1 TABLET DAILY EXCEPT 1/2 TABLET ON SUNDAYS, TUESDAYS and THURSDAYS.  Continue eating 2 serving of greens per week plus your one day of brussels sprouts. Recheck INR in 5 weeks with MD Appt.  Coumadin  Clinic [phone] Procedure Fax #[phone]

## 2024-06-19 NOTE — Progress Notes (Signed)
 Description   INR-2.1; Continue taking warfarin 1 TABLET DAILY EXCEPT 1/2 TABLET ON SUNDAYS, TUESDAYS and THURSDAYS.  Continue eating 2 serving of greens per week plus your one day of brussels sprouts. Recheck INR in 5 weeks with MD Appt.  Coumadin  Clinic [phone] Procedure Fax #[phone]

## 2024-06-30 DIAGNOSIS — E119 Type 2 diabetes mellitus without complications: Secondary | ICD-10-CM | POA: Diagnosis not present

## 2024-07-03 ENCOUNTER — Telehealth: Payer: Self-pay | Admitting: Pharmacy Technician

## 2024-07-03 ENCOUNTER — Telehealth (HOSPITAL_BASED_OUTPATIENT_CLINIC_OR_DEPARTMENT_OTHER): Payer: Self-pay | Admitting: *Deleted

## 2024-07-03 DIAGNOSIS — M1612 Unilateral primary osteoarthritis, left hip: Secondary | ICD-10-CM | POA: Diagnosis not present

## 2024-07-03 MED ORDER — DAPAGLIFLOZIN PROPANEDIOL 10 MG PO TABS: 10.0000 mg | ORAL_TABLET | Freq: Every day | ORAL | 3 refills | Status: AC

## 2024-07-03 NOTE — Telephone Encounter (Signed)
 Pt's medication was sent to pt's pharmacy as requested. Confirmation received.

## 2024-07-03 NOTE — Addendum Note (Signed)
 Addended by: BLUFORD RAMP D on: 07/03/2024 04:20 PM   Modules accepted: Orders

## 2024-07-03 NOTE — Telephone Encounter (Signed)
 Hi, we received a refill request for farxiga  from [facility]. Can someone please send refill to [facility]? Thank you!   [facility] - [place], [place] - [address] [place] [place] [address]  Phone:?[phone]??Fax:?[phone]

## 2024-07-03 NOTE — Telephone Encounter (Signed)
   Pre-operative Risk Assessment    Patient Name: Tracey Todd  DOB: 06-04-1935 MRN: [id number]   Date of last office visit: 05/15/24 DR. CAMNITZ Date of next office visit: 07/25/24 DR. [facility]   Request for Surgical Clearance    Procedure:  LEFT TOTAL HIP REPLACEMENT  Date of Surgery:  Clearance TBD                                Surgeon:  DR. DANIEL MARCHWIANY Surgeon's Group or Practice Name:  [facility] Phone number:  [phone] KELLY HIGH Fax number:  [phone]   Type of Clearance Requested:   - Medical  - Pharmacy:  Hold Warfarin (Coumadin )     Type of Anesthesia:  Spinal   Additional requests/questions:    Bonney Niels Jest   07/03/2024, 5:03 PM

## 2024-07-04 NOTE — Telephone Encounter (Signed)
   Name: Tracey Todd  DOB: [birth year]  MRN: [id number]  Primary Cardiologist: Stanly DELENA Leavens, MD  Chart reviewed as part of pre-operative protocol coverage. The patient has an upcoming visit scheduled with Dr. Leavens on 07/25/2024 at which time clearance can be addressed in case there are any issues that would impact surgical recommendations.  LEFT TOTAL HIP REPLACEMENT Is not scheduled until TBD as below. I added preop FYI to appointment note so that provider is aware to address at time of outpatient visit.  Per office protocol the cardiology provider should forward their finalized clearance decision and recommendations regarding antiplatelet therapy to the requesting party below.    This message will also be routed to pharmacy pool for input on holding warfarin as requested below so that this information is available to the clearing provider at time of patient's appointment.   I will route this message as FYI to requesting party and remove this message from the preop box as separate preop APP input not needed at this time.   Please call with any questions.  Lamarr Satterfield, NP  07/04/2024, 9:40 AM

## 2024-07-04 NOTE — Telephone Encounter (Signed)
 Pharmacy please advise on holding coumadin  prior to LEFT TOTAL HIP REPLACEMENT  scheduled for TBD. Last labs (BMET-04/01/2024) and (CBC 11/14/2023). Thank you.

## 2024-07-04 NOTE — Telephone Encounter (Signed)
 Will update the requesting provider the pt has appt with her cardiologist 07/25/24 who will address preop clearance at that time.

## 2024-07-11 NOTE — Telephone Encounter (Signed)
 Patient with diagnosis of atrial fibrillation on warfarin for anticoagulation.    Procedure:  LEFT TOTAL HIP REPLACEMENT   Date of Surgery:  Clearance TBD                                  CHA2DS2-VASc Score = 7   This indicates a 11.2% annual risk of stroke. The patient's score is based upon: CHF History: 1 HTN History: 1 Diabetes History: 1 Stroke History: 0 Vascular Disease History: 1 Age Score: 2 Gender Score: 1   CrCl 49 Platelet count 161  Patient has not had an Afib/aflutter ablation in the last 3 months, DCCV within the last 4 weeks or a watchman implanted in the last 45 days   Per office protocol, patient can hold warfarin for 5 days prior to procedure.    Office protocol suggests CHADS2-VASc  >/= 7 should be bridged with enoxaparin .  Patient previously took enoxaparin  in 2020, had problems with severe itching.   Has not used since.   Will ask Dr. Santo to review when he sees patient in November.   (Could also consider switch to Eliquis  - can get from [facility] grant at n/c if qualifies).  **This guidance is not considered finalized until pre-operative APP has relayed final recommendations.**

## 2024-07-11 NOTE — Telephone Encounter (Signed)
 As below, pt has appt with Dr. Santo 07/25/24 at which time pharm has relayed anticoag recommendations/considerations to review at that visit for possible switch to Eliquis . Already routed to MD below. No additional preop APP needs, will remove from APP box.

## 2024-07-25 ENCOUNTER — Ambulatory Visit: Attending: Internal Medicine | Admitting: Internal Medicine

## 2024-07-25 ENCOUNTER — Ambulatory Visit: Attending: Internal Medicine | Admitting: Pharmacist

## 2024-07-25 ENCOUNTER — Other Ambulatory Visit (HOSPITAL_COMMUNITY): Payer: Self-pay

## 2024-07-25 VITALS — BP 130/60 | HR 66 | Ht <= 58 in | Wt 195.6 lb

## 2024-07-25 DIAGNOSIS — I361 Nonrheumatic tricuspid (valve) insufficiency: Secondary | ICD-10-CM | POA: Diagnosis not present

## 2024-07-25 DIAGNOSIS — Z01818 Encounter for other preprocedural examination: Secondary | ICD-10-CM

## 2024-07-25 DIAGNOSIS — I4821 Permanent atrial fibrillation: Secondary | ICD-10-CM | POA: Diagnosis not present

## 2024-07-25 DIAGNOSIS — Z7901 Long term (current) use of anticoagulants: Secondary | ICD-10-CM

## 2024-07-25 DIAGNOSIS — I480 Paroxysmal atrial fibrillation: Secondary | ICD-10-CM | POA: Diagnosis not present

## 2024-07-25 LAB — POCT INR: INR: 2.7 (ref 2.0–3.0)

## 2024-07-25 MED ORDER — APIXABAN 5 MG PO TABS
5.0000 mg | ORAL_TABLET | Freq: Two times a day (BID) | ORAL | 0 refills | Status: AC
  Filled 2024-07-25: qty 60, 30d supply, fill #0

## 2024-07-25 MED ORDER — APIXABAN 5 MG PO TABS: 5.0000 mg | ORAL_TABLET | Freq: Two times a day (BID) | ORAL | 5 refills | Status: DC

## 2024-07-25 NOTE — Progress Notes (Signed)
 Cardiology Office Note:  .    Date:  07/25/2024  ID:  Ronal JAYSON Piety, DOB Nov 16, 1934, MRN [id number] PCP: Ricky Alfrieda DASEN, DO  [facility] Providers Cardiologist:  Stanly DELENA Leavens, MD Electrophysiologist:  Will Gladis Norton, MD     CC: Pre-op Eval  History of Present Illness: Tracey    AVIAN Todd is a 89 y.o. female with heart failure and atrial fibrillation who presents for preoperative evaluation for hip surgery. She was referred by her surgeon for preoperative cardiac risk stratification.  She is scheduled for hip surgery in six to eight weeks. She experiences significant hip pain that limits her mobility, describing a sensation of 'cracking' when walking. She can walk five laps around the house daily and perform moderate activities such as preparing Thanksgiving dinner, but cannot walk a block and finds walking with a rollator causes arm fatigue.  Her medical history includes atrial fibrillation and a pacemaker. No chest pain, shortness of breath, palpitations, or syncope. Her weight has fluctuated, and she reports having good and bad days. She has peripheral neuropathy and lumbar radiculopathy. She recalls a past adverse reaction to quinapril , which caused facial swelling.  She has been on warfarin and has an allergy to Lovenox , described as itching. She is concerned about medication reactions due to a past ICU admission for a medication-related adverse event.  She has undergone prior treatments for her hip, including a cortisone injection in February, which she reports 'messed up my blood' by thinning it excessively. The injection did not alleviate her symptoms.  Discussed the use of AI scribe software for clinical note transcription with the patient, who gave verbal consent to proceed.  Relevant histories: .  Social  2023: Found to have severe TR; started higher dose lasix , and MRA, and SGLT2i.  Found to have severe TR. 2024: Saw Rosaline Bane, found to have RSV.   Has increase in her torsemide .  TR has improved ROS: As per HPI.   Studies Reviewed: .     Cardiac Studies & Procedures   ______________________________________________________________________________________________     ECHOCARDIOGRAM  ECHOCARDIOGRAM COMPLETE 02/28/2024  Narrative ECHOCARDIOGRAM REPORT    Patient Name:   Tracey Todd Date of Exam: 02/28/2024 Medical Rec #:  [id number]       Height:       58.0 in Accession #:    [id number]      Weight:       191.0 lb Date of Birth:  31-May-1935       BSA:          1.786 m Patient Age:    88 years        BP:           126/77 mmHg Patient Gender: F               HR:           64 bpm. Exam Location:  [address]  Procedure: 2D Echo, Cardiac Doppler, Color Doppler and 3D Echo (Both Spectral and Color Flow Doppler were utilized during procedure).  Indications:    Chronic heart failure with preserved ejection fraction I50.32 ; Severe tricuspid regurgitation I36.1  History:        Patient has prior history of Echocardiogram examinations, most recent 03/11/2023. CAD, Pacemaker, Pulmonary HTN, Arrythmias:Atrial Flutter and Atrial Fibrillation; Risk Factors:Hypertension and Diabetes.  Sonographer:    Augustin Seals RDCS Referring Phys: [id number] [facility] A Trust Crago  IMPRESSIONS   1. Left ventricular ejection fraction,  by estimation, is 60 to 65%. Left ventricular ejection fraction by 3D volume is 61 %. The left ventricle has normal function. The left ventricle has no regional wall motion abnormalities. There is mild concentric left ventricular hypertrophy. Left ventricular diastolic parameters are consistent with Grade I diastolic dysfunction (impaired relaxation). 2. Right ventricular systolic function is normal. The right ventricular size is mildly enlarged. There is mildly elevated pulmonary artery systolic pressure. The estimated right ventricular systolic pressure is 40.2 mmHg. 3. The mitral valve is normal in  structure. Trivial mitral valve regurgitation. 4. Tricuspid valve regurgitation is mild to moderate. 5. The aortic valve is normal in structure. There is mild calcification of the aortic valve. Aortic valve regurgitation is trivial.  FINDINGS Left Ventricle: Left ventricular ejection fraction, by estimation, is 60 to 65%. Left ventricular ejection fraction by 3D volume is 61 %. The left ventricle has normal function. The left ventricle has no regional wall motion abnormalities. The left ventricular internal cavity size was normal in size. There is mild concentric left ventricular hypertrophy. Left ventricular diastolic parameters are consistent with Grade I diastolic dysfunction (impaired relaxation).  Right Ventricle: The right ventricular size is mildly enlarged. No increase in right ventricular wall thickness. Right ventricular systolic function is normal. There is mildly elevated pulmonary artery systolic pressure. The tricuspid regurgitant velocity is 3.05 m/s, and with an assumed right atrial pressure of 3 mmHg, the estimated right ventricular systolic pressure is 40.2 mmHg.  Left Atrium: Left atrial size was normal in size.  Right Atrium: Right atrial size was normal in size.  Pericardium: There is no evidence of pericardial effusion.  Mitral Valve: The mitral valve is normal in structure. Trivial mitral valve regurgitation.  Tricuspid Valve: The tricuspid valve is normal in structure. Tricuspid valve regurgitation is mild to moderate.  Aortic Valve: The aortic valve is normal in structure. There is mild calcification of the aortic valve. Aortic valve regurgitation is trivial.  Pulmonic Valve: The pulmonic valve was normal in structure. Pulmonic valve regurgitation is trivial.  Aorta: The aortic root is normal in size and structure.  IAS/Shunts: No atrial level shunt detected by color flow Doppler.  Additional Comments: 3D was performed not requiring image post processing on an  independent workstation and was normal.   LEFT VENTRICLE PLAX 2D LVIDd:         4.30 cm         Diastology LVIDs:         2.70 cm         LV e' medial:    3.89 cm/s LV PW:         1.30 cm         LV E/e' medial:  15.5 LV IVS:        1.30 cm         LV e' lateral:   5.92 cm/s LVOT diam:     2.00 cm         LV E/e' lateral: 10.2 LV SV:         57 LV SV Index:   32 LVOT Area:     3.14 cm        3D Volume EF LV 3D EF:    Left ventricul ar ejection fraction by 3D volume is 61 %.  3D Volume EF: 3D EF:        61 % LV EDV:       99 ml LV ESV:       38 ml  LV SV:        60 ml  RIGHT VENTRICLE             IVC RV Basal diam:  4.90 cm     IVC diam: 1.50 cm RV Mid diam:    4.00 cm RV S prime:     10.40 cm/s TAPSE (M-mode): 2.1 cm  LEFT ATRIUM             Index        RIGHT ATRIUM           Index LA diam:        3.40 cm 1.90 cm/m   RA Area:     12.50 cm LA Vol (A2C):   24.1 ml 13.50 ml/m  RA Volume:   28.00 ml  15.68 ml/m LA Vol (A4C):   22.0 ml 12.32 ml/m LA Biplane Vol: 24.8 ml 13.89 ml/m AORTIC VALVE LVOT Vmax:   77.00 cm/s LVOT Vmean:  51.400 cm/s LVOT VTI:    0.180 m  AORTA Ao Root diam: 3.00 cm Ao Asc diam:  3.20 cm  MITRAL VALVE                TRICUSPID VALVE MV Area (PHT): 2.91 cm     TR Peak grad:   37.2 mmHg MV Decel Time: 261 msec     TR Vmax:        305.00 cm/s MV E velocity: 60.30 cm/s MV A velocity: 105.00 cm/s  SHUNTS MV E/A ratio:  0.57         Systemic VTI:  0.18 m Systemic Diam: 2.00 cm  Aditya Sabharwal Electronically signed by Ria Commander Signature Date/Time: 02/28/2024/11:11:41 AM    Final          ______________________________________________________________________________________________        Risk Assessment/Calculations:         Physical Exam:    VS:  BP 130/60 (BP Location: Right Arm, Patient Position: Sitting)   Pulse 66   Ht 4' 10 (1.473 m)   Wt 195 lb 9.6 oz (88.7 kg)   SpO2 96%   BMI 40.88 kg/m     Wt Readings from Last 3 Encounters:  07/25/24 195 lb 9.6 oz (88.7 kg)  05/15/24 192 lb (87.1 kg)  05/02/24 191 lb 6.4 oz (86.8 kg)    Gen: no distress , Morbid obesity Neck: No JVD Cardiac: No Rubs or Gallops, systolic murmur, RRR +2 radial pulses Respiratory: Clear to auscultation bilaterally, normal effort, normal  respiratory rate GI: Soft, nontender, non-distended  MS: No  edema;  moves all extremities Integument: Skin feels warm Neuro:  At time of evaluation, alert and oriented to person/place/time/situation  Psych: Normal affect, patient feels ok      ASSESSMENT AND PLAN: .    Preoperative cardiac risk assessment for hip arthroplasty - Cardiac risk stratification for upcoming hip arthroplasty. No current chest pain, shortness of breath, or palpitations. Functional capacity includes moderate activity such as preparing a Thanksgiving dinner. No signs of angina during activities. Decision to proceed with surgery based on functional capacity and absence of significant cardiac symptoms. Risks of perioperative anticoagulation discussed, including potential stroke risk without bridging therapy. Decision made to proceed without bridging due to history of adverse reactions to Lovenox  and other medications. - Proceed with hip arthroplasty as scheduled (fMETS 4.06%, asymptomatic) - Discussed the risks and benefits of lovenox  prior to surgery due to risk of adverse reactions (allergy to lovenox ) vs stroke risk during the  time of not bridging; after SDM with patient and daughter we will plan to not bridge with lovenox  - Switch from warfarin to eliquis  (Discussed with out Pharm D team at length)  Severe right hip osteoarthritis Has: CKD, PN, Lumbar radiculopathy, PWT 13 mm,  - I recommend that her surgeon send her tissue from the hip to pathology for Congo Red Staining; this would increase our suspicion for ATTR-CA  Permanent atrial fibrillation status post pacemaker with anticoagulation  management Permanent atrial fibrillation managed with pacemaker and anticoagulation. Current anticoagulation with warfarin. Plan to switch to a newer anticoagulant postoperatively to reduce need for frequent monitoring. - Switch from warfarin to a newer anticoagulant postoperatively  Heart failure with preserved ejection fraction Atrial functional tricuspid regurgitation - Likely secondary to long-standing atrial fibrillation and atrial dilation; continue diuretic therapy, repeat echo in Summer 2026 advised - continue current therapy   History of adverse reaction to Lovenox  and ACE inhibitor impacting perioperative anticoagulation strategy Adverse reactions to Lovenox  and ACE inhibitors impact perioperative anticoagulation strategy. Decision made to avoid Lovenox  bridging after prolonged discussion  Suspected cardiac amyloidosis (pending tissue diagnosis) Suspected cardiac amyloidosis based on clinical features, but no definitive diagnosis. Plan to obtain tissue from hip surgery for amyloid staining to confirm diagnosis. - Requested hip tissue be stained for amyloid during surgery. - Will discuss potential additional testing if amyloid is found postoperatively.  Spring f/u with me for consideration of ATTR-CA testing  Time:   I have spent a total of 41 minutes with the patient reviewing notes, imaging, EKGs, labs, and examining the patient as well as establishing an assessment and plan that was discussed personally with the patient. Discussed disease state education, and using shared decision making about briding anticoagulation and discussing risks and benefits with patient and daughter. Reviewed care and plan in collaboration with Pharmacy and preop eval.    Stanly Leavens, MD FASE [facility] Cardiologist [facility]  [address] [place], [address] [place], [place] [address] [phone]  10:10 AM

## 2024-07-25 NOTE — Patient Instructions (Addendum)
 Description   INR-2.7; Continue taking warfarin 1 TABLET DAILY EXCEPT 1/2 TABLET ON SUNDAYS, TUESDAYS and THURSDAYS.  Continue eating 2 serving of greens per week plus your one day of brussels sprouts. Recheck INR in 5 weeks with MD Appt.  Coumadin  Clinic [phone] Procedure Fax #[phone]

## 2024-07-25 NOTE — Patient Instructions (Addendum)
 Medication Instructions:  Our pharmacist will be contacting you about transitioning from warfarin to eliquis .   *If you need a refill on your cardiac medications before your next appointment, please call your pharmacy*  Lab Work: None.  If you have labs (blood work) drawn today and your tests are completely normal, you will receive your results only by: MyChart Message (if you have MyChart) OR A paper copy in the mail If you have any lab test that is abnormal or we need to change your treatment, we will call you to review the results.  Testing/Procedures: None.  Follow-Up: At [facility], you and your health needs are our priority.  As part of our continuing mission to provide you with exceptional heart care, our providers are all part of one team.  This team includes your primary Cardiologist (physician) and Advanced Practice Providers or APPs (Physician Assistants and Nurse Practitioners) who all work together to provide you with the care you need, when you need it.  Your next appointment:   March 2026  Provider:   Stanly DELENA Leavens, MD    We recommend signing up for the patient portal called MyChart.  Sign up information is provided on this After Visit Summary.  MyChart is used to connect with patients for Virtual Visits (Telemedicine).  Patients are able to view lab/test results, encounter notes, upcoming appointments, etc.  Non-urgent messages can be sent to your provider as well.   To learn more about what you can do with MyChart, go to [email or web address].   Other Instructions Dr. Leavens will be reaching out to your Orthopedic surgeon regarding your cardiac clearance and your plan to transition from coumadin  to eliquis .

## 2024-07-25 NOTE — Progress Notes (Signed)
 Description   INR-2.7; Continue taking warfarin 1 TABLET DAILY EXCEPT 1/2 TABLET ON SUNDAYS, TUESDAYS and THURSDAYS.  Continue eating 2 serving of greens per week plus your one day of brussels sprouts. Recheck INR in 5 weeks with MD Appt.  Coumadin  Clinic [phone] Procedure Fax #[phone]

## 2024-08-01 ENCOUNTER — Telehealth: Payer: Self-pay

## 2024-08-01 NOTE — Telephone Encounter (Signed)
 Patient has been identified as having an A1C >8 per the [place] A1C Metric and I called to make appt for nurse to get A1C. Patient states that she is not being seen here anymore. [facility] [place]

## 2024-08-02 ENCOUNTER — Ambulatory Visit: Admitting: Podiatry

## 2024-08-02 DIAGNOSIS — D689 Coagulation defect, unspecified: Secondary | ICD-10-CM

## 2024-08-02 DIAGNOSIS — E1142 Type 2 diabetes mellitus with diabetic polyneuropathy: Secondary | ICD-10-CM | POA: Diagnosis not present

## 2024-08-02 DIAGNOSIS — B351 Tinea unguium: Secondary | ICD-10-CM | POA: Diagnosis not present

## 2024-08-02 DIAGNOSIS — M79676 Pain in unspecified toe(s): Secondary | ICD-10-CM

## 2024-08-02 NOTE — Progress Notes (Signed)
 This patient returns to my office for at risk foot care.  This patient requires this care by a professional since this patient will be at risk due to having diabetes and coagulation defect.  Patient is taking coumadin .  This patient is unable to cut nails herself since the patient cannot reach her nails.These nails are painful walking and wearing shoes.  This patient presents for at risk foot care today.  General Appearance  Alert, conversant and in no acute stress.  Vascular  Dorsalis pedis and posterior tibial  pulses are weakly  palpable  bilaterally.  Capillary return is within normal limits  bilaterally. Temperature is within normal limits  Bilaterally. Absent hair.  Neurologic  Senn-Weinstein monofilament wire test within normal limits  bilaterally. Muscle power within normal limits bilaterally.  Nails Thick disfigured discolored nails with subungual debris  from hallux to fifth toes bilaterally. No evidence of bacterial infection or drainage bilaterally.  Orthopedic  No limitations of motion  feet .  No crepitus or effusions noted.  No bony pathology or digital deformities noted.  Skin  normotropic skin with no porokeratosis noted bilaterally.  No signs of infections or ulcers noted.     Onychomycosis  Pain in right toes  Pain in left toes  Consent was obtained for treatment procedures.   Mechanical debridement of nails 1-5  bilaterally performed with a nail nipper.  No dremel usage.   Return office visit   10 weeks                 Told patient to return for periodic foot care and evaluation due to potential at risk complications.   Cordella Bold DPM

## 2024-08-08 DIAGNOSIS — I10 Essential (primary) hypertension: Secondary | ICD-10-CM | POA: Diagnosis not present

## 2024-08-08 DIAGNOSIS — E119 Type 2 diabetes mellitus without complications: Secondary | ICD-10-CM | POA: Diagnosis not present

## 2024-08-14 ENCOUNTER — Other Ambulatory Visit: Payer: Self-pay | Admitting: Internal Medicine

## 2024-08-16 ENCOUNTER — Telehealth: Payer: Self-pay

## 2024-08-16 NOTE — Telephone Encounter (Signed)
 Patient was identified as falling into the [place] Measure - Diabetes.   Patient was: Patient is not currently using our practice.Patient uses [facility].

## 2024-08-23 ENCOUNTER — Ambulatory Visit (INDEPENDENT_AMBULATORY_CARE_PROVIDER_SITE_OTHER)

## 2024-08-23 DIAGNOSIS — I495 Sick sinus syndrome: Secondary | ICD-10-CM | POA: Diagnosis not present

## 2024-08-27 LAB — CUP PACEART REMOTE DEVICE CHECK
Battery Remaining Longevity: 7 mo
Battery Remaining Percentage: 7 %
Battery Voltage: 2.78 V
Brady Statistic AP VP Percent: 98 %
Brady Statistic AP VS Percent: 1 %
Brady Statistic AS VP Percent: 1.2 %
Brady Statistic AS VS Percent: 1 %
Brady Statistic RA Percent Paced: 98 %
Brady Statistic RV Percent Paced: 99 %
Date Time Interrogation Session: 20251227125749
Implantable Lead Connection Status: 753985
Implantable Lead Connection Status: 753985
Implantable Lead Implant Date: 20160226
Implantable Lead Implant Date: 20160226
Implantable Lead Location: 753859
Implantable Lead Location: 753860
Implantable Lead Model: 1948
Implantable Pulse Generator Implant Date: 20160226
Lead Channel Impedance Value: 430 Ohm
Lead Channel Impedance Value: 560 Ohm
Lead Channel Pacing Threshold Amplitude: 0.75 V
Lead Channel Pacing Threshold Amplitude: 0.75 V
Lead Channel Pacing Threshold Pulse Width: 0.4 ms
Lead Channel Pacing Threshold Pulse Width: 0.4 ms
Lead Channel Sensing Intrinsic Amplitude: 1.3 mV
Lead Channel Sensing Intrinsic Amplitude: 9.1 mV
Lead Channel Setting Pacing Amplitude: 2.5 V
Lead Channel Setting Pacing Amplitude: 2.5 V
Lead Channel Setting Pacing Pulse Width: 0.4 ms
Lead Channel Setting Sensing Sensitivity: 2 mV
Pulse Gen Model: 2240
Pulse Gen Serial Number: 7700661

## 2024-08-28 ENCOUNTER — Ambulatory Visit: Payer: Self-pay | Admitting: Cardiology

## 2024-08-29 NOTE — Progress Notes (Signed)
 Remote PPM Transmission

## 2024-09-03 ENCOUNTER — Other Ambulatory Visit: Payer: Self-pay | Admitting: Physician Assistant

## 2024-09-14 ENCOUNTER — Inpatient Hospital Stay (HOSPITAL_COMMUNITY)
Admission: EM | Admit: 2024-09-14 | Discharge: 2024-09-16 | DRG: 378 | Disposition: A | Discharge status: Home Health | Attending: Internal Medicine | Admitting: Internal Medicine

## 2024-09-14 ENCOUNTER — Other Ambulatory Visit: Payer: Self-pay

## 2024-09-14 DIAGNOSIS — I495 Sick sinus syndrome: Secondary | ICD-10-CM | POA: Diagnosis present

## 2024-09-14 DIAGNOSIS — Z888 Allergy status to other drugs, medicaments and biological substances status: Secondary | ICD-10-CM

## 2024-09-14 DIAGNOSIS — E1165 Type 2 diabetes mellitus with hyperglycemia: Secondary | ICD-10-CM | POA: Diagnosis not present

## 2024-09-14 DIAGNOSIS — E559 Vitamin D deficiency, unspecified: Secondary | ICD-10-CM | POA: Diagnosis present

## 2024-09-14 DIAGNOSIS — R040 Epistaxis: Secondary | ICD-10-CM | POA: Diagnosis present

## 2024-09-14 DIAGNOSIS — I5033 Acute on chronic diastolic (congestive) heart failure: Secondary | ICD-10-CM

## 2024-09-14 DIAGNOSIS — I4892 Unspecified atrial flutter: Secondary | ICD-10-CM | POA: Diagnosis present

## 2024-09-14 DIAGNOSIS — Z880 Allergy status to penicillin: Secondary | ICD-10-CM

## 2024-09-14 DIAGNOSIS — K921 Melena: Secondary | ICD-10-CM | POA: Diagnosis not present

## 2024-09-14 DIAGNOSIS — Z7985 Long-term (current) use of injectable non-insulin antidiabetic drugs: Secondary | ICD-10-CM

## 2024-09-14 DIAGNOSIS — Z96659 Presence of unspecified artificial knee joint: Secondary | ICD-10-CM | POA: Diagnosis present

## 2024-09-14 DIAGNOSIS — Z87891 Personal history of nicotine dependence: Secondary | ICD-10-CM

## 2024-09-14 DIAGNOSIS — I5032 Chronic diastolic (congestive) heart failure: Secondary | ICD-10-CM | POA: Diagnosis present

## 2024-09-14 DIAGNOSIS — Z950 Presence of cardiac pacemaker: Secondary | ICD-10-CM

## 2024-09-14 DIAGNOSIS — Z794 Long term (current) use of insulin: Secondary | ICD-10-CM | POA: Diagnosis not present

## 2024-09-14 DIAGNOSIS — Z7901 Long term (current) use of anticoagulants: Secondary | ICD-10-CM

## 2024-09-14 DIAGNOSIS — M48062 Spinal stenosis, lumbar region with neurogenic claudication: Secondary | ICD-10-CM

## 2024-09-14 DIAGNOSIS — K31811 Angiodysplasia of stomach and duodenum with bleeding: Principal | ICD-10-CM | POA: Diagnosis present

## 2024-09-14 DIAGNOSIS — I480 Paroxysmal atrial fibrillation: Secondary | ICD-10-CM | POA: Diagnosis present

## 2024-09-14 DIAGNOSIS — Z79899 Other long term (current) drug therapy: Secondary | ICD-10-CM

## 2024-09-14 DIAGNOSIS — M1611 Unilateral primary osteoarthritis, right hip: Secondary | ICD-10-CM | POA: Diagnosis present

## 2024-09-14 DIAGNOSIS — F32A Depression, unspecified: Secondary | ICD-10-CM | POA: Diagnosis present

## 2024-09-14 DIAGNOSIS — E1142 Type 2 diabetes mellitus with diabetic polyneuropathy: Secondary | ICD-10-CM

## 2024-09-14 DIAGNOSIS — I2510 Atherosclerotic heart disease of native coronary artery without angina pectoris: Secondary | ICD-10-CM | POA: Diagnosis present

## 2024-09-14 DIAGNOSIS — K2970 Gastritis, unspecified, without bleeding: Secondary | ICD-10-CM | POA: Diagnosis present

## 2024-09-14 DIAGNOSIS — I272 Pulmonary hypertension, unspecified: Secondary | ICD-10-CM | POA: Diagnosis present

## 2024-09-14 DIAGNOSIS — I10 Essential (primary) hypertension: Secondary | ICD-10-CM | POA: Diagnosis present

## 2024-09-14 DIAGNOSIS — K920 Hematemesis: Secondary | ICD-10-CM | POA: Diagnosis present

## 2024-09-14 DIAGNOSIS — K760 Fatty (change of) liver, not elsewhere classified: Secondary | ICD-10-CM | POA: Diagnosis present

## 2024-09-14 DIAGNOSIS — Z833 Family history of diabetes mellitus: Secondary | ICD-10-CM

## 2024-09-14 DIAGNOSIS — D62 Acute posthemorrhagic anemia: Secondary | ICD-10-CM | POA: Diagnosis present

## 2024-09-14 DIAGNOSIS — E66812 Obesity, class 2: Secondary | ICD-10-CM | POA: Diagnosis present

## 2024-09-14 DIAGNOSIS — I110 Hypertensive heart disease with heart failure: Secondary | ICD-10-CM | POA: Diagnosis present

## 2024-09-14 DIAGNOSIS — E7800 Pure hypercholesterolemia, unspecified: Secondary | ICD-10-CM | POA: Diagnosis present

## 2024-09-14 DIAGNOSIS — K219 Gastro-esophageal reflux disease without esophagitis: Secondary | ICD-10-CM | POA: Diagnosis present

## 2024-09-14 LAB — CBC WITH DIFFERENTIAL/PLATELET
Abs Immature Granulocytes: 0.04 K/uL (ref 0.00–0.07)
Basophils Absolute: 0.1 K/uL (ref 0.0–0.1)
Basophils Relative: 1 %
Eosinophils Absolute: 0.2 K/uL (ref 0.0–0.5)
Eosinophils Relative: 2 %
HCT: 37.9 % (ref 36.0–46.0)
Hemoglobin: 12.3 g/dL (ref 12.0–15.0)
Immature Granulocytes: 1 %
Lymphocytes Relative: 15 %
Lymphs Abs: 1.3 K/uL (ref 0.7–4.0)
MCH: 28.1 pg (ref 26.0–34.0)
MCHC: 32.5 g/dL (ref 30.0–36.0)
MCV: 86.5 fL (ref 80.0–100.0)
Monocytes Absolute: 0.9 K/uL (ref 0.1–1.0)
Monocytes Relative: 10 %
Neutro Abs: 6.2 K/uL (ref 1.7–7.7)
Neutrophils Relative %: 71 %
Platelets: 257 K/uL (ref 150–400)
RBC: 4.38 MIL/uL (ref 3.87–5.11)
RDW: 15.6 % — ABNORMAL HIGH (ref 11.5–15.5)
WBC: 8.7 K/uL (ref 4.0–10.5)
nRBC: 0 % (ref 0.0–0.2)

## 2024-09-14 LAB — PROTIME-INR
INR: 1.1 (ref 0.8–1.2)
Prothrombin Time: 15.2 s (ref 11.4–15.2)

## 2024-09-14 LAB — COMPREHENSIVE METABOLIC PANEL WITH GFR
ALT: 11 U/L (ref 0–44)
AST: 26 U/L (ref 15–41)
Albumin: 4 g/dL (ref 3.5–5.0)
Alkaline Phosphatase: 65 U/L (ref 38–126)
Anion gap: 12 (ref 5–15)
BUN: 29 mg/dL — ABNORMAL HIGH (ref 8–23)
CO2: 28 mmol/L (ref 22–32)
Calcium: 9.8 mg/dL (ref 8.9–10.3)
Chloride: 98 mmol/L (ref 98–111)
Creatinine, Ser: 0.98 mg/dL (ref 0.44–1.00)
GFR, Estimated: 56 mL/min — ABNORMAL LOW
Glucose, Bld: 151 mg/dL — ABNORMAL HIGH (ref 70–99)
Potassium: 4.1 mmol/L (ref 3.5–5.1)
Sodium: 139 mmol/L (ref 135–145)
Total Bilirubin: 0.6 mg/dL (ref 0.0–1.2)
Total Protein: 7.7 g/dL (ref 6.5–8.1)

## 2024-09-14 LAB — URINALYSIS, ROUTINE W REFLEX MICROSCOPIC
Bilirubin Urine: NEGATIVE
Glucose, UA: >=500 mg/dL — AB
Hgb urine dipstick: NEGATIVE
Ketones, ur: NEGATIVE mg/dL
Leukocytes,Ua: NEGATIVE
Nitrite: NEGATIVE
Protein, ur: NEGATIVE mg/dL
Specific Gravity, Urine: 1.011 (ref 1.005–1.030)
pH: 5 (ref 5.0–8.0)

## 2024-09-14 LAB — HEMOGLOBIN AND HEMATOCRIT, BLOOD
HCT: 34.7 % — ABNORMAL LOW (ref 36.0–46.0)
HCT: 34.2 % — ABNORMAL LOW (ref 36.0–46.0)
Hemoglobin: 11.6 g/dL — ABNORMAL LOW (ref 12.0–15.0)
Hemoglobin: 11.3 g/dL — ABNORMAL LOW (ref 12.0–15.0)

## 2024-09-14 LAB — TYPE AND SCREEN
ABO/RH(D): O POS
Antibody Screen: NEGATIVE

## 2024-09-14 LAB — GLUCOSE, CAPILLARY: Glucose-Capillary: 115 mg/dL — ABNORMAL HIGH (ref 70–99)

## 2024-09-14 MED ORDER — ALBUTEROL SULFATE (2.5 MG/3ML) 0.083% IN NEBU: 2.5000 mg | INHALATION_SOLUTION | Freq: Four times a day (QID) | RESPIRATORY_TRACT | Status: DC | PRN

## 2024-09-14 MED ORDER — ACETAMINOPHEN 325 MG PO TABS
650.0000 mg | ORAL_TABLET | Freq: Four times a day (QID) | ORAL | Status: DC | PRN
  Administered 2024-09-14 – 2024-09-15 (×2): 650 mg via ORAL
  Filled 2024-09-14 (×2): qty 2

## 2024-09-14 MED ORDER — INSULIN GLARGINE-YFGN 100 UNIT/ML ~~LOC~~ SOLN: 14.0000 [IU] | Freq: Every day | SUBCUTANEOUS | Status: DC

## 2024-09-14 MED ORDER — PANTOPRAZOLE SODIUM 40 MG IV SOLR
80.0000 mg | Freq: Once | INTRAVENOUS | Status: AC
  Administered 2024-09-14: 80 mg via INTRAVENOUS
  Filled 2024-09-14: qty 20

## 2024-09-14 MED ORDER — INSULIN ASPART 100 UNIT/ML IJ SOLN
0.0000 [IU] | Freq: Three times a day (TID) | INTRAMUSCULAR | Status: DC
  Administered 2024-09-15: 1 [IU] via SUBCUTANEOUS
  Administered 2024-09-16: 5 [IU] via SUBCUTANEOUS
  Filled 2024-09-14: qty 5

## 2024-09-14 MED ORDER — PANTOPRAZOLE SODIUM 40 MG IV SOLR
40.0000 mg | Freq: Two times a day (BID) | INTRAVENOUS | Status: DC
  Administered 2024-09-14 – 2024-09-16 (×4): 40 mg via INTRAVENOUS
  Filled 2024-09-14 (×4): qty 10

## 2024-09-14 MED ORDER — OXYMETAZOLINE HCL 0.05 % NA SOLN
1.0000 | Freq: Two times a day (BID) | NASAL | Status: DC | PRN
  Filled 2024-09-14: qty 30

## 2024-09-14 MED ORDER — INSULIN GLARGINE 100 UNIT/ML ~~LOC~~ SOLN
14.0000 [IU] | Freq: Every day | SUBCUTANEOUS | Status: DC
  Administered 2024-09-15: 14 [IU] via SUBCUTANEOUS
  Filled 2024-09-14 (×2): qty 0.14

## 2024-09-14 MED ORDER — ATORVASTATIN CALCIUM 80 MG PO TABS
80.0000 mg | ORAL_TABLET | Freq: Every day | ORAL | Status: DC
  Administered 2024-09-15 – 2024-09-16 (×2): 80 mg via ORAL
  Filled 2024-09-14 (×3): qty 1

## 2024-09-14 MED ORDER — FLUTICASONE PROPIONATE 50 MCG/ACT NA SUSP
2.0000 | Freq: Two times a day (BID) | NASAL | Status: DC
  Administered 2024-09-14 – 2024-09-16 (×4): 2 via NASAL
  Filled 2024-09-14: qty 16

## 2024-09-14 MED ORDER — DAPAGLIFLOZIN PROPANEDIOL 10 MG PO TABS
10.0000 mg | ORAL_TABLET | Freq: Every day | ORAL | Status: DC
  Administered 2024-09-15 – 2024-09-16 (×2): 10 mg via ORAL
  Filled 2024-09-14 (×2): qty 1

## 2024-09-14 MED ORDER — GABAPENTIN 300 MG PO CAPS
300.0000 mg | ORAL_CAPSULE | Freq: Two times a day (BID) | ORAL | Status: DC
  Administered 2024-09-14 – 2024-09-16 (×4): 300 mg via ORAL
  Filled 2024-09-14 (×4): qty 1

## 2024-09-14 MED ORDER — BRIMONIDINE TARTRATE 0.2 % OP SOLN
1.0000 [drp] | Freq: Two times a day (BID) | OPHTHALMIC | Status: DC
  Administered 2024-09-14 – 2024-09-16 (×4): 1 [drp] via OPHTHALMIC
  Filled 2024-09-14: qty 5

## 2024-09-14 MED ORDER — ACETAMINOPHEN 650 MG RE SUPP: 650.0000 mg | Freq: Four times a day (QID) | RECTAL | Status: DC | PRN

## 2024-09-14 MED ORDER — ONDANSETRON HCL 4 MG/2ML IJ SOLN: 4.0000 mg | Freq: Four times a day (QID) | INTRAMUSCULAR | Status: DC | PRN

## 2024-09-14 MED ORDER — OXYMETAZOLINE HCL 0.05 % NA SOLN
1.0000 | Freq: Two times a day (BID) | NASAL | Status: DC
  Filled 2024-09-14: qty 30

## 2024-09-14 MED ORDER — SODIUM CHLORIDE 0.9 % IV SOLN: INTRAVENOUS | Status: AC

## 2024-09-14 MED ORDER — ONDANSETRON HCL 4 MG PO TABS: 4.0000 mg | ORAL_TABLET | Freq: Four times a day (QID) | ORAL | Status: DC | PRN

## 2024-09-14 MED ORDER — SODIUM CHLORIDE 0.9 % IV SOLN: INTRAVENOUS | Status: DC

## 2024-09-14 MED ORDER — SODIUM CHLORIDE 0.9% FLUSH
3.0000 mL | Freq: Two times a day (BID) | INTRAVENOUS | Status: DC
  Administered 2024-09-14 – 2024-09-16 (×5): 3 mL via INTRAVENOUS

## 2024-09-14 NOTE — Progress Notes (Addendum)
 NEW ADMISSION NOTE New Admission Note:   Arrival Method: Patient arrived from ED Mental Orientation: Alert and oriented x 4. Telemetry:40M-11 Paced Assessment: Completed Skin: warm, dry and intact. IV: L FA and R AC  Pain: Denies any pain. Tubes: None Safety Measures: Safety Fall Prevention Plan has been given, discussed and signed Admission: In process [facility] Orientation: Patient has been orientated to the room, unit and staff.  Family: Daughter at bedside  Orders have been reviewed and implemented. Will continue to monitor the patient. Call light has been placed within reach and bed alarm has been activated.   Franciso Bodily, RN

## 2024-09-14 NOTE — Hospital Course (Addendum)
 89 year old female past medical history atrial fibrillation/flutter on Eliquis , sick sinus syndrome, diastolic heart failure, pulmonary hypertension, hyperlipidemia, DM type II of presents emergency department complaining of 3 days of dark stool.  Went to PCP recommended to hold Eliquis .  Patient also has noticed scant blood with her cough mixed with the sputum also has nosebleed about 1 hour prior to this episode. At PCP clinic Hemoccult positive.  At presentation to ED patient is hemodynamically stable. Lab work, CBC showed low hemoglobin 12.3 entheses baseline hemoglobin variable 12-15).  Normal WBC and platelet count. CMP unremarkable.  UA bacteria present no evidence of UTI.  Discussed with ED PA and concern for patient has on episode of nosebleed that caused some mucus during the sputum.  EDP consulted [facility] for GI bleed.  Recommended ED physician to give IV Protonix  80 mg.  Recommended to obtain CT angio GI bleed study however per Dr. Midge given patient is hemodynamically stable and no active GI bleed presently EDP deferring GI bleed study.  Hospitalist consulted for further evaluation management of GI bleed.

## 2024-09-14 NOTE — Progress Notes (Signed)
" ° ° °  PROCEDURAL EXPEDITER PROGRESS NOTE  Patient Name: Tracey Todd  DOB:03-19-1935 Date of Admission: 09/14/2024  Date of Assessment:09/14/24   -------------------------------------------------------------------------------------------------------------------   Brief clinical summary: Pt to Endo tomorrow for esophagogastroduodenoscopy with propofol    Orders in place:  Yes   Labs, test, and orders reviewed: Y  Requires surgical clearance:  No  Barriers noted: N/A  -------------------------------------------------------------------------------------------------------------------  [facility] [place], [place], [place] Please contact us  directly via secure chat (search for [facility]) or by calling us  at [phone] [facility]).  "

## 2024-09-14 NOTE — Care Management Obs Status (Signed)
 MEDICARE OBSERVATION STATUS NOTIFICATION   Patient Details  Name: Tracey Todd MRN: [id number] Date of Birth: 1935-06-12   Medicare Observation Status Notification Given:  Yes Verbally reviewed observation notice with Ronal Piety telephonically at [phone].  Will mail a copy to the patient home address.   [name] 09/14/2024, 4:01 PM

## 2024-09-14 NOTE — Plan of Care (Signed)

## 2024-09-14 NOTE — Consult Note (Addendum)
 [facility] Consult  Referring Provider: ER Primary Care Physician:  Ricky Alfrieda DASEN, DO Primary Gastroenterologist: Dr. Celestia  Reason for Consultation: Black stool, diarrhea  HPI: Tracey Todd is a 89 y.o. female was in her usual state of health until last week when she noticed that stools were loose, she would have 4-5 bowel movements a day and had a very malodorous smell to it.  This was not associated with nausea, vomiting but she had chills, felt cold and weak.  She was seen by her primary care physician yesterday who did a rectal exam and advised her to get labs performed and see GI for concerns of melena. When she went home, she developed nosebleeds and appeared to cough up streaks of blood which made her daughter called EMS and bring the patient to the hospital.  Patient states normally she has 1 bowel movement a day and stools are brown. She has not vomited blood and denies noticing fresh blood in stool. Denies acid reflux, heartburn, difficulty swallowing, pain on swallowing, abdominal or rectal pain, unintentional weight loss or loss of appetite. She takes Eliquis  twice a day and last dose was yesterday morning. Denies use of aspirin  or NSAIDs.  Labs with [facility] on 08/08/2024 showed hemoglobin 12.5.  Previous GI workup: EGD, 2016, epigastric abdominal pain, Dr. Dallas: GERD without esophagitis Colonoscopy, 2016, surveillance, Dr. Celestia: Inflammatory descending polyp removed, no microscopic colitis, repeat not recommended due to age. EGD, 2014, Dr. Celestia, right upper quadrant abdominal pain, heme positive stool: Unremarkable Colonoscopy, 2010, family history of colon cancer in father: Unremarkable, repeat recommended in 5 years  Past Medical History:  Diagnosis Date   Atrial flutter (HCC)    a. mentioned in 2016 admission.   Chronic diastolic CHF (congestive heart failure) (HCC)    DJD (degenerative joint disease)    Fatty liver    H/O total knee replacement  02/13/2013   HTN (hypertension)    Hypercholesteremia    Mild CAD    a. minimal by cath 2016.   Nodule of chest wall    Normal coronary arteries and LVF 10/23/14 10/24/2014   Pacemaker implanted 10/25/14- [employer] Jude 10/26/2014   Paroxysmal atrial fibrillation (HCC)    a. identified on device interrogation, burden low   Pulmonary hypertension (HCC)    Sick sinus syndrome (HCC)    a. s/p STJ dual chamber PPM    Type II or unspecified type diabetes mellitus without mention of complication, not stated as uncontrolled    Vitamin D  deficiency     Past Surgical History:  Procedure Laterality Date   BREAST SURGERY     CATARACT EXTRACTION     LEFT AND RIGHT HEART CATHETERIZATION WITH CORONARY ANGIOGRAM N/A 10/23/2014   Procedure: LEFT AND RIGHT HEART CATHETERIZATION WITH CORONARY ANGIOGRAM;  Surgeon: Ozell JONETTA Fell, MD;  Location: [facility] CATH LAB;  Service: Cardiovascular;  Laterality: N/A;   PERMANENT PACEMAKER INSERTION N/A 10/25/2014   STJ dual chamber PPM implanted by Dr Kelsie for SSS   TONSILLECTOMY AND ADENOIDECTOMY     TOTAL KNEE ARTHROPLASTY      Prior to Admission medications  Medication Sig Start Date End Date Taking? Authorizing Provider  acetaminophen  (TYLENOL ) 500 MG tablet Take 1 tablet (500 mg total) by mouth every 6 (six) hours as needed. 11/14/23  Yes Charlyn Sora, MD  atorvastatin  (LIPITOR) 80 MG tablet Take 1 tablet by mouth once daily 09/03/24  Yes Weaver, Scott T, PA-C  bisoprolol  (ZEBETA ) 10 MG tablet Take 1 tablet  by mouth once daily for blood pressure 03/23/24  Yes Massey, Amy J, NP  brimonidine  (ALPHAGAN ) 0.2 % ophthalmic solution Place 1 drop into both eyes 2 (two) times daily. 05/06/23  Yes [provider]  calcium  carbonate (OS-CAL) 600 MG TABS tablet Take 600 mg by mouth 2 (two) times daily with a meal.   Yes [provider]  cetirizine (ZYRTEC) 10 MG chewable tablet Chew 10 mg by mouth daily as needed for allergies.   Yes [provider]   Cholecalciferol (VITAMIN D ) 2000 UNITS CAPS Take 6,000 Units by mouth daily.    Yes [provider]  dapagliflozin  propanediol (FARXIGA ) 10 MG TABS tablet Take 1 tablet (10 mg total) by mouth daily before breakfast. 07/03/24  Yes Lucien, Tessa N, PA-C  fluticasone  (FLONASE ) 50 MCG/ACT nasal spray Place 2 sprays into both nostrils 2 (two) times daily. 09/27/23  Yes Tobie Comp B, MD  gabapentin  (NEURONTIN ) 300 MG capsule Take 1 capsule (300 mg total) by mouth 2 (two) times daily. 01/11/23  Yes Patel, Donika K, DO  insulin  glargine (LANTUS  SOLOSTAR) 100 UNIT/ML Solostar Pen Inject 14 Units into the skin at bedtime.   Yes [provider]  Magnesium  500 MG TABS Take 1,000 mg by mouth in the morning and at bedtime. 2 tabs am, 2 tab pm   Yes [provider]  Semaglutide,0.25 or 0.5MG /DOS, (OZEMPIC, 0.25 OR 0.5 MG/DOSE,) 2 MG/3ML SOPN Inject 1 mg into the skin every Sunday. 04/21/23  Yes [provider]  spironolactone  (ALDACTONE ) 25 MG tablet Take 1/2 (one-half) tablet by mouth once daily 08/14/24  Yes Chandrasekhar, Mahesh A, MD  torsemide  (DEMADEX ) 20 MG tablet Take 2 tablets (40 mg) by mouth daily in the mornings, Take 1 tablet (20 mg) by mouth on Monday, Wednesday, and Friday in the afternoon. Patient taking differently: Take 10-20 mg by mouth See admin instructions. Take 2 tablets by mouth in the morning and 1 tablet in the evening 09/22/23  Yes Weaver, Scott T, PA-C  apixaban  (ELIQUIS ) 5 MG TABS tablet Take 1 tablet (5 mg total) by mouth 2 (two) times daily. Patient not taking: Reported on 09/14/2024 07/25/24   Santo Stanly LABOR, MD  apixaban  (ELIQUIS ) 5 MG TABS tablet Take 1 tablet (5 mg total) by mouth 2 (two) times daily. Patient not taking: Reported on 09/14/2024 07/25/24   Santo Stanly A, MD  Blood Glucose Monitoring Suppl (ONETOUCH VERIO) w/Device KIT Use to check fasting blood sugar daily. Dx: E11.59 08/15/19   Theotis Haze ORN, NP  glucose blood  (ONETOUCH VERIO) test strip USE TO CHECK FASTING BLOOD SUGAR TWICE DAILY 09/14/22   Jaycee Greig PARAS, NP  Lancets [facility] DELICA PLUS LANCET33G) MISC USE TO CHECK FASTING BLOOD SUGAR TWICE DAILY 09/14/22   Jaycee Greig PARAS, NP  MM PEN NEEDLES 32G X 4 MM MISC  09/12/23   [provider]    No current facility-administered medications for this encounter.   Current Outpatient Medications  Medication Sig Dispense Refill   acetaminophen  (TYLENOL ) 500 MG tablet Take 1 tablet (500 mg total) by mouth every 6 (six) hours as needed. 30 tablet 0   atorvastatin  (LIPITOR) 80 MG tablet Take 1 tablet by mouth once daily 90 tablet 0   bisoprolol  (ZEBETA ) 10 MG tablet Take 1 tablet by mouth once daily for blood pressure 90 tablet 0   brimonidine  (ALPHAGAN ) 0.2 % ophthalmic solution Place 1 drop into both eyes 2 (two) times daily.     calcium  carbonate (OS-CAL)  600 MG TABS tablet Take 600 mg by mouth 2 (two) times daily with a meal.     cetirizine (ZYRTEC) 10 MG chewable tablet Chew 10 mg by mouth daily as needed for allergies.     Cholecalciferol (VITAMIN D ) 2000 UNITS CAPS Take 6,000 Units by mouth daily.      dapagliflozin  propanediol (FARXIGA ) 10 MG TABS tablet Take 1 tablet (10 mg total) by mouth daily before breakfast. 90 tablet 3   fluticasone  (FLONASE ) 50 MCG/ACT nasal spray Place 2 sprays into both nostrils 2 (two) times daily. 16 g 6   gabapentin  (NEURONTIN ) 300 MG capsule Take 1 capsule (300 mg total) by mouth 2 (two) times daily. 180 capsule 3   insulin  glargine (LANTUS  SOLOSTAR) 100 UNIT/ML Solostar Pen Inject 14 Units into the skin at bedtime.     Magnesium  500 MG TABS Take 1,000 mg by mouth in the morning and at bedtime. 2 tabs am, 2 tab pm     Semaglutide,0.25 or 0.5MG /DOS, (OZEMPIC, 0.25 OR 0.5 MG/DOSE,) 2 MG/3ML SOPN Inject 1 mg into the skin every Sunday.     spironolactone  (ALDACTONE ) 25 MG tablet Take 1/2 (one-half) tablet by mouth once daily 45 tablet 3   torsemide  (DEMADEX ) 20 MG tablet  Take 2 tablets (40 mg) by mouth daily in the mornings, Take 1 tablet (20 mg) by mouth on Monday, Wednesday, and Friday in the afternoon. (Patient taking differently: Take 10-20 mg by mouth See admin instructions. Take 2 tablets by mouth in the morning and 1 tablet in the evening)     apixaban  (ELIQUIS ) 5 MG TABS tablet Take 1 tablet (5 mg total) by mouth 2 (two) times daily. (Patient not taking: Reported on 09/14/2024) 60 tablet 0   apixaban  (ELIQUIS ) 5 MG TABS tablet Take 1 tablet (5 mg total) by mouth 2 (two) times daily. (Patient not taking: Reported on 09/14/2024) 60 tablet 5   Blood Glucose Monitoring Suppl (ONETOUCH VERIO) w/Device KIT Use to check fasting blood sugar daily. Dx: E11.59 1 kit 0   glucose blood (ONETOUCH VERIO) test strip USE TO CHECK FASTING BLOOD SUGAR TWICE DAILY 100 each 11   Lancets (ONETOUCH DELICA PLUS LANCET33G) MISC USE TO CHECK FASTING BLOOD SUGAR TWICE DAILY 100 each 2   MM PEN NEEDLES 32G X 4 MM MISC       Allergies as of 09/14/2024 - Review Complete 09/14/2024  Allergen Reaction Noted   Penicillin g Anaphylaxis 02/25/2023   Penicillins Anaphylaxis 03/01/2011   Quinapril  Anaphylaxis 06/05/2018   Other  02/25/2023   Piroxicam Other (See Comments) 12/14/2011   Calcium -containing compounds Other (See Comments) 12/14/2011   Celecoxib Itching 12/14/2011   Oxaprozin Itching and Other (See Comments) 12/14/2011    Family History  Problem Relation Age of Onset   Diabetes Mother    Cancer Mother    Cancer Father    Breast cancer Neg Hx     Social History   Socioeconomic History   Marital status: Married    Spouse name: Not on file   Number of children: Not on file   Years of education: Not on file   Highest education level: Not on file  Occupational History   Not on file  Tobacco Use   Smoking status: Former    Current packs/day: 0.00    Types: Cigarettes    Quit date: 12/06/1963    Years since quitting: 60.8    Passive exposure: Never   Smokeless  tobacco: Never  Vaping Use  Vaping status: Never Used  Substance and Sexual Activity   Alcohol use: No   Drug use: Never   Sexual activity: Not Currently  Other Topics Concern   Not on file  Social History Narrative   Left Handed    Lives in a one story home       Loss her husband 10/03/22   Social Drivers of Health   Tobacco Use: Medium Risk (05/24/2024)   Patient History    Smoking Tobacco Use: Former    Smokeless Tobacco Use: Never    Passive Exposure: Never  Physicist, Medical Strain: Not on file  Food Insecurity: Not on file  Transportation Needs: Not on file  Physical Activity: Not on file  Stress: Not on file  Social Connections: Not on file  Intimate Partner Violence: Not on file  Depression (PHQ2-9): Low Risk (12/27/2023)   Depression (PHQ2-9)    PHQ-2 Score: 2  Alcohol Screen: Not on file  Housing: Not on file  Utilities: Not on file  Health Literacy: Not on file    Review of Systems: As per HPI  Physical Exam: Vital signs in last 24 hours: Temp:  [98.8 F (37.1 C)-99.3 F (37.4 C)] 99.3 F (37.4 C) (01/16 0630) Pulse Rate:  [59-68] 59 (01/16 0630) Resp:  [14-16] 15 (01/16 0630) BP: (111-132)/(52-53) 111/53 (01/16 0630) SpO2:  [96 %-99 %] 97 % (01/16 0630) Weight:  [84.8 kg] 84.8 kg (01/16 0405)    General:   Alert,  Well-developed, overweight, pleasant and cooperative in NAD Head:  Normocephalic and atraumatic. Eyes:  Sclera clear, no icterus.   Conjunctiva pink. Ears:  Normal auditory acuity. Nose:  No deformity, discharge,  or lesions. Mouth:  No deformity or lesions.  Oropharynx pink & moist. Neck:  Supple; no masses or thyromegaly. Lungs:  Clear throughout to auscultation.   No wheezes, crackles, or rhonchi. No acute distress. Heart:  Regular rate and rhythm; no murmurs, clicks, rubs,  or gallops. Extremities: Postsurgical scar from prior knee replacement, without clubbing or edema. Neurologic:  Alert and  oriented x4;  grossly normal  neurologically. Skin:  Intact without significant lesions or rashes. Psych:  Alert and cooperative. Normal mood and affect. Abdomen:  Soft, nontender and nondistended. No masses, hepatosplenomegaly or hernias noted. Normal bowel sounds, without guarding, and without rebound.         Lab Results: Recent Labs    09/14/24 0456  WBC 8.7  HGB 12.3  HCT 37.9  PLT 257   BMET Recent Labs    09/14/24 0426  NA 139  K 4.1  CL 98  CO2 28  GLUCOSE 151*  BUN 29*  CREATININE 0.98  CALCIUM  9.8   LFT Recent Labs    09/14/24 0426  PROT 7.7  ALBUMIN 4.0  AST 26  ALT 11  ALKPHOS 65  BILITOT 0.6   PT/INR Recent Labs    09/14/24 0456  LABPROT 15.2  INR 1.1    Studies/Results: No results found.  Impression: Black tarry stool, diarrhea, elevated BUN/creatinine ratio suspicious for upper GI bleeding hemoglobin normal at 12.3, MCV 86.5, PT and INR normal 15.2/1.1 Stable hemodynamics, blood pressure 118/51, heart rate 54 Has been given Protonix  80mg  IV x 1 and is on Protonix  40 mg every 12 hours  Diarrhea?  Infectious  Atrial fibrillation on Eliquis , last dose yesterday morning 09/13/2024   Co-morbidities: CAD, atrial fibrillation/flutter, sick sinus syndrome, diastolic heart failure, pulmonary hypertension, hypertension, dyslipidemia, diabetes  Lab abnormalities: Elevated BUN 29 with normal  creatinine and limited low GFR at 56  Plan: Clear liquid diet today, n.p.o. postmidnight for EGD tomorrow in a.m. with Dr. Kriss.  Stool studies for C. difficile and GI pathogen panel.     LOS: 0 days   Estelita Manas, MD  09/14/2024, 7:53 AM

## 2024-09-14 NOTE — ED Provider Notes (Signed)
 " [place] Provider Note   CSN: [id number] Arrival date & time: 09/14/24  9642     Patient presents with: Hematemesis, Melena, and Flank Pain   Tracey Todd is a 89 y.o. female.   89 year old female brought in by EMS from home for 3 days of dark stools, no abdominal pain. Went to PCP yesterday, told to hold her Eliquis , labs obtained (does not know results). Woke up tonight and coughed and noted scant blood in her mucous, had a nose bleed about 1 hour prior to this. Denies SHOB, CP. Hx of paroxysmal a fib, DM, HTN, CHF.        Prior to Admission medications  Medication Sig Start Date End Date Taking? Authorizing Provider  acetaminophen  (TYLENOL ) 500 MG tablet Take 1 tablet (500 mg total) by mouth every 6 (six) hours as needed. 11/14/23   Charlyn Sora, MD  apixaban  (ELIQUIS ) 5 MG TABS tablet Take 1 tablet (5 mg total) by mouth 2 (two) times daily. 07/25/24   Chandrasekhar, Stanly LABOR, MD  apixaban  (ELIQUIS ) 5 MG TABS tablet Take 1 tablet (5 mg total) by mouth 2 (two) times daily. 07/25/24   Santo Stanly LABOR, MD  atorvastatin  (LIPITOR) 80 MG tablet Take 1 tablet by mouth once daily 09/03/24   Lelon Hamilton T, PA-C  bisoprolol  (ZEBETA ) 10 MG tablet Take 1 tablet by mouth once daily for blood pressure 03/23/24   Jaycee, Amy J, NP  Blood Glucose Monitoring Suppl (ONETOUCH VERIO) w/Device KIT Use to check fasting blood sugar daily. Dx: E11.59 08/15/19   Theotis Haze ORN, NP  brimonidine  (ALPHAGAN ) 0.2 % ophthalmic solution Place 1 drop into both eyes 2 (two) times daily. 05/06/23   [provider]  calcium  carbonate (OS-CAL) 600 MG TABS tablet Take 600 mg by mouth daily with breakfast.    [provider]  cetirizine (ZYRTEC) 10 MG chewable tablet as needed for allergies.    [provider]  Cholecalciferol (VITAMIN D ) 2000 UNITS CAPS Take 6,000 Units by mouth daily.     [provider]  dapagliflozin   propanediol (FARXIGA ) 10 MG TABS tablet Take 1 tablet (10 mg total) by mouth daily before breakfast. 07/03/24   Conte, Tessa N, PA-C  fluticasone  (FLONASE ) 50 MCG/ACT nasal spray Place 2 sprays into both nostrils 2 (two) times daily. 09/27/23   Tobie Eldora NOVAK, MD  gabapentin  (NEURONTIN ) 300 MG capsule Take 1 capsule (300 mg total) by mouth 2 (two) times daily. 01/11/23   Patel, Donika K, DO  glucose blood (ONETOUCH VERIO) test strip USE TO CHECK FASTING BLOOD SUGAR TWICE DAILY 09/14/22   Jaycee Greig PARAS, NP  insulin  glargine (LANTUS  SOLOSTAR) 100 UNIT/ML Solostar Pen Inject 12 Units into the skin at bedtime.    [provider]  Lancets (ONETOUCH DELICA PLUS LANCET33G) MISC USE TO CHECK FASTING BLOOD SUGAR TWICE DAILY 09/14/22   Jaycee Greig PARAS, NP  loratadine (CLARITIN) 10 MG tablet as needed for allergies. Patient not taking: Reported on 07/25/2024    [provider]  Magnesium  500 MG TABS Take 1,000 mg by mouth in the morning and at bedtime. 2 tabs am, 2 tab pm    [provider]  MM PEN NEEDLES 32G X 4 MM MISC  09/12/23   [provider]  Semaglutide,0.25 or 0.5MG /DOS, (OZEMPIC, 0.25 OR 0.5 MG/DOSE,) 2 MG/3ML SOPN Inject 0.5 mg into the skin once a week. 04/21/23   [provider]  spironolactone  (  ALDACTONE ) 25 MG tablet Take 1/2 (one-half) tablet by mouth once daily 08/14/24   Santo Kelly A, MD  torsemide  (DEMADEX ) 20 MG tablet Take 2 tablets (40 mg) by mouth daily in the mornings, Take 1 tablet (20 mg) by mouth on Monday, Wednesday, and Friday in the afternoon. 09/22/23   Lelon Hamilton T, PA-C  triamcinolone  (KENALOG ) 0.025 % cream Apply 1 Application topically 2 (two) times daily. Patient not taking: Reported on 07/25/2024 05/25/23   Jaycee Greig PARAS, NP    Allergies: Penicillin g, Penicillins, Quinapril , Other, Piroxicam, Calcium -containing compounds, Celecoxib, and Oxaprozin    Review of Systems Negative except as per HPI Updated Vital Signs BP  (!) 118/52   Pulse 64   Temp 98.8 F (37.1 C) (Oral)   Resp 14   Ht 4' 10 (1.473 m)   Wt 84.8 kg   SpO2 96%   BMI 39.08 kg/m   Physical Exam Vitals and nursing note reviewed.  Constitutional:      General: She is not in acute distress.    Appearance: She is well-developed. She is not diaphoretic.  HENT:     Head: Normocephalic and atraumatic.     Nose: Congestion present.     Comments: Small amount of dried blood in left nostril/anterior septum     Mouth/Throat:     Mouth: Mucous membranes are moist.     Pharynx: No oropharyngeal exudate or posterior oropharyngeal erythema.  Eyes:     General:        Right eye: Discharge present.        Left eye: Discharge present. Cardiovascular:     Rate and Rhythm: Normal rate and regular rhythm.     Heart sounds: Normal heart sounds.  Pulmonary:     Effort: Pulmonary effort is normal.     Breath sounds: Normal breath sounds.  Abdominal:     Palpations: Abdomen is soft.     Tenderness: There is no abdominal tenderness.  Musculoskeletal:     Cervical back: Neck supple.     Right lower leg: No edema.     Left lower leg: No edema.  Skin:    General: Skin is warm and dry.     Findings: No erythema or rash.  Neurological:     Mental Status: She is alert and oriented to person, place, and time.  Psychiatric:        Behavior: Behavior normal.     (all labs ordered are listed, but only abnormal results are displayed) Labs Reviewed  COMPREHENSIVE METABOLIC PANEL WITH GFR - Abnormal; Notable for the following components:      Result Value   Glucose, Bld 151 (*)    BUN 29 (*)    GFR, Estimated 56 (*)    All other components within normal limits  CBC WITH DIFFERENTIAL/PLATELET - Abnormal; Notable for the following components:   RDW 15.6 (*)    All other components within normal limits  URINALYSIS, ROUTINE W REFLEX MICROSCOPIC - Abnormal; Notable for the following components:   APPearance HAZY (*)    Glucose, UA >=500 (*)     Bacteria, UA RARE (*)    All other components within normal limits  PROTIME-INR  CBC WITH DIFFERENTIAL/PLATELET  TYPE AND SCREEN    EKG: EKG Interpretation Date/Time:  Friday September 14 2024 03:59:16 EST Ventricular Rate:  61 PR Interval:  170 QRS Duration:  158 QT Interval:  462 QTC Calculation: 466 R Axis:   -78  Text Interpretation: Atrial-ventricular dual-paced  rhythm No significant change since last tracing Confirmed by Midge Golas ([id number]) on 09/14/2024 4:24:49 AM  Radiology: No results found.   Procedures   Medications Ordered in the ED  pantoprazole  (PROTONIX ) injection 80 mg (has no administration in time range)                                    Medical Decision Making Amount and/or Complexity of Data Reviewed Labs: ordered.   This patient presents to the ED for concern of melena, hemoptysis, epistaxis, this involves an extensive number of treatment options, and is a complaint that carries with it a high risk of complications and morbidity.  The differential diagnosis includes but not limited to upper versus lower GI bleed, anemia   Co morbidities / Chronic conditions that complicate the patient evaluation  HLD, PAF, DM, HTN, CHF, additional history reviewed on chart   Additional history obtained:  Additional history obtained from EMR External records from outside source obtained and reviewed including prior labs and imaging on file Visit to [facility] PCP yesterday, Hemoccult positive stools   Lab Tests:  I Ordered, and personally interpreted labs.  The pertinent results include: Urinalysis with glucose otherwise unremarkable.  CBC without significant findings, specifically hemoglobin is 12 although it is down from 15.  CMP without significant findings.  INR normal.   Cardiac Monitoring: / EKG:  The patient was maintained on a cardiac monitor.  I personally viewed and interpreted the cardiac monitored which showed an underlying rhythm of: dual  paced, rate 61   Problem List / ED Course / Critical interventions / Medication management  89 year old female presents from home with dark stools for the past 3 days which were Hemoccult positive in PCP clinic yesterday.  Labs and vitals stable.  Patient without abdominal pain.  Patient had a nosebleed in the middle of the night and about an hour later coughed up a small amount of blood.  She does have dried blood in her left nostril.  Suspect the blood that she coughed up is secondary to the nosebleed.  She has not had ongoing hemoptysis.  Patient admitted to hospitalist service with request to GI team for consult. I ordered medication including protonix    Reevaluation of the patient after these medicines showed that the patient stable  I have reviewed the patients home medicines and have made adjustments as needed   Consultations Obtained:  I requested consultation with the Dr. Sundil with [facility],  and discussed lab and imaging findings as well as pertinent plan - they recommend: admit, [facility] to consult, secure chat sent to Dr. Elicia, add on IV Protonix .    Social Determinants of Health:  Has PCP, lives with family   Test / Admission - Considered:  Admit Consider CT angio abdomen however not actively having bloody bowel movements, stable.  Will defer to day team.      Final diagnoses:  Gastrointestinal hemorrhage with melena    ED Discharge Orders     None          Beverley Leita DELENA DEVONNA 09/14/24 [id number]    Midge Golas, MD 09/14/24 [phone]  "

## 2024-09-14 NOTE — H&P (Addendum)
 " History and Physical    Patient: Tracey Todd FMW:[id number] DOB: 05-27-1935 DOA: 09/14/2024 DOS: the patient was seen and examined on 09/14/2024 PCP: Ricky Alfrieda DASEN, DO  Patient coming from: Home  Chief Complaint:  Chief Complaint  Patient presents with      Melena   Flank Pain   HPI: Tracey Todd is a 89 y.o. female with medical history significant of hypertension, hyperlipidemia, diastolic congestive heart failure, atrial fibrillation/flutter, sick sinus syndrome s/p pacemaker, pulmonary hypertension, CAD, and diabetes mellitus type 2 who presents with reports of blood in her stools and coughing up blood. She is accompanied by her daughter. She has been experiencing gastrointestinal bleeding, initially noted as blood in her stools, which began last week. This was accompanied by diarrhea with a noticeable odor, occurring up to five times per day. This morning, she began experiencing epistaxis and later started coughing up bright red blood. She reports a burning sensation in her stomach, particularly during bowel movements, and has experienced some burning since being in the medical facility. She is currently on Eliquis , with the last dose taken yesterday morning, prescribed in anticipation of a hip replacement surgery. However, her A1c levels have been elevated, preventing the surgery from proceeding. No recent antibiotic use. No significant stomach pain, although there is a burning sensation in the middle of her stomach. Her legs are sore due to the need for a hip replacement.    In the emergency department patient was noted to be afebrile with pulse 59-68, blood pressure 111/53-132/53, and all other vital signs maintained.  Labs noted hemoglobin 12.3, BUN 29, creatinine 0.89, glucose 151, and INR 1.1.  Stool guaiacs were noted to be positive.  Urinalysis noted 500 glucose, rare bacteria, 6-10 RBC/hpf.  Patient was typed and screened for possible need of blood products.  Patient was given  Protonix  80 mg IV x 1 dose  Review of Systems: As mentioned in the history of present illness. All other systems reviewed and are negative. Past Medical History:  Diagnosis Date   Atrial flutter (HCC)    a. mentioned in 2016 admission.   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 minimal by cath 2016.   Nodule of chest wall    Normal coronary arteries and LVF 10/23/14 10/24/2014   Pacemaker implanted 10/25/14- [employer] 10/26/2014   Paroxysmal atrial fibrillation (HCC)    a. identified on device interrogation, burden low   Pulmonary hypertension (HCC)    Sick sinus syndrome (HCC)    a. s/p STJ dual chamber PPM    Type II or unspecified type diabetes mellitus without mention of complication, not stated as uncontrolled    Vitamin D  deficiency    Past Surgical History:  Procedure Laterality Date   BREAST SURGERY     CATARACT EXTRACTION     LEFT AND RIGHT HEART CATHETERIZATION WITH CORONARY ANGIOGRAM N/A 10/23/2014   Procedure: LEFT AND RIGHT HEART CATHETERIZATION WITH CORONARY ANGIOGRAM;  Surgeon: Ozell JONETTA Fell, MD;  Location[facility] CATH LAB;  Service: Cardiovascular;  Laterality: N/A;   PERMANENT PACEMAKER INSERTION N/A 10/25/2014   STJ dual chamber PPM implanted by Dr Kelsie for SSS   TONSILLECTOMY AND ADENOIDECTOMY     TOTAL KNEE ARTHROPLASTY     Social History:  reports that  she quit smoking about 60 years ago. Her smoking use included cigarettes. She has never been exposed to tobacco smoke. She has never used smokeless tobacco. She reports that she does not drink alcohol and does not use drugs.  Allergies[1]  Family History  Problem Relation Age of Onset   Diabetes Mother    Cancer Mother    Cancer Father    Breast cancer Neg Hx     Prior to Admission medications  Medication Sig Start Date End Date Taking?  Authorizing Provider  acetaminophen  (TYLENOL ) 500 MG tablet Take 1 tablet (500 mg total) by mouth every 6 (six) hours as needed. 11/14/23  Yes Charlyn Sora, MD  atorvastatin  (LIPITOR) 80 MG tablet Take 1 tablet by mouth once daily 09/03/24  Yes Weaver, Scott T, PA-C  bisoprolol  (ZEBETA ) 10 MG tablet Take 1 tablet by mouth once daily for blood pressure 03/23/24  Yes Massey, Amy J, NP  brimonidine  (ALPHAGAN ) 0.2 % ophthalmic solution Place 1 drop into both eyes 2 (two) times daily. 05/06/23  Yes [provider]  calcium  carbonate (OS-CAL) 600 MG TABS tablet Take 600 mg by mouth 2 (two) times daily with a meal.   Yes [provider]  cetirizine (ZYRTEC) 10 MG chewable tablet Chew 10 mg by mouth daily as needed for allergies.   Yes [provider]  Cholecalciferol (VITAMIN D ) 2000 UNITS CAPS Take 6,000 Units by mouth daily.    Yes [provider]  dapagliflozin  propanediol (FARXIGA ) 10 MG TABS tablet Take 1 tablet (10 mg total) by mouth daily before breakfast. 07/03/24  Yes Conte, Tessa N, PA-C  fluticasone  (FLONASE ) 50 MCG/ACT nasal spray Place 2 sprays into both nostrils 2 (two) times daily. 09/27/23  Yes Tobie Eldora NOVAK, MD  gabapentin  (NEURONTIN ) 300 MG capsule Take 1 capsule (300 mg total) by mouth 2 (two) times daily. 01/11/23  Yes Patel, Donika K, DO  insulin  glargine (LANTUS  SOLOSTAR) 100 UNIT/ML Solostar Pen Inject 14 Units into the skin at bedtime.   Yes [provider]  Magnesium  500 MG TABS Take 1,000 mg by mouth in the morning and at bedtime. 2 tabs am, 2 tab pm   Yes [provider]  Semaglutide,0.25 or 0.5MG /DOS, (OZEMPIC, 0.25 OR 0.5 MG/DOSE,) 2 MG/3ML SOPN Inject 1 mg into the skin every Sunday. 04/21/23  Yes [provider]  spironolactone  (ALDACTONE ) 25 MG tablet Take 1/2 (one-half) tablet by mouth once daily 08/14/24  Yes Chandrasekhar, Mahesh A, MD  torsemide  (DEMADEX ) 20 MG tablet Take 2 tablets (40 mg) by mouth daily in the  mornings, Take 1 tablet (20 mg) by mouth on Monday, Wednesday, and Friday in the afternoon. Patient taking differently: Take 10-20 mg by mouth See admin instructions. Take 2 tablets by mouth in the morning and 1 tablet in the evening 09/22/23  Yes Weaver, Scott T, PA-C  apixaban  (ELIQUIS ) 5 MG TABS tablet Take 1 tablet (5 mg total) by mouth 2 (two) times daily. Patient not taking: Reported on 09/14/2024 07/25/24   Santo Stanly LABOR, MD  apixaban  (ELIQUIS ) 5 MG TABS tablet Take 1 tablet (5 mg total) by mouth 2 (two) times daily. Patient not taking: Reported on 09/14/2024 07/25/24   Santo Stanly A, MD  Blood Glucose Monitoring Suppl (ONETOUCH VERIO) w/Device KIT Use to check fasting blood sugar daily. Dx: E11.59 08/15/19   Theotis Haze ORN, NP  glucose blood [facility] VERIO) test strip USE TO CHECK FASTING BLOOD SUGAR TWICE DAILY 09/14/22   Jaycee Greig PARAS, NP  Lancets (ONETOUCH DELICA PLUS LANCET33G) MISC USE TO CHECK FASTING BLOOD SUGAR TWICE DAILY 09/14/22   Jaycee Greig PARAS, NP  MM PEN NEEDLES 32G X 4 MM MISC  09/12/23   [provider]    Physical Exam: Vitals:   09/14/24 0404 09/14/24 0405 09/14/24 0515 09/14/24 0630  BP: (!) 132/53  (!) 118/52 (!) 111/53  Pulse: 68  64 (!) 59  Resp: 16  14 15   Temp: 98.8 F (37.1 C)   99.3 F (37.4 C)  TempSrc: Oral   Oral  SpO2: 99%  96% 97%  Weight:  84.8 kg    Height:  4' 10 (1.473 m)      Constitutional: Elderly female currently in no acute distress Eyes: PERRL, lids and conjunctivae normal ENMT: Mucous membranes are moist.  Normal dentition.  Neck: normal, supple  Respiratory: clear to auscultation bilaterally, no wheezing, no crackles. Normal respiratory effort. No accessory muscle use.  Cardiovascular: Regular rate and rhythm, no murmurs / rubs / gallops.   2+ pedal pulses. No carotid bruits.  Abdomen: Mild epigastric tenderness to palpation.  Bowel sounds positive.  Musculoskeletal: no clubbing / cyanosis.  Patient noted to  have tenderness palpation of the left lower extremity. Skin: no rashes, lesions, ulcers. No induration Neurologic: CN 2-12 grossly intact.   Strength 5/5 in all 4.  Psychiatric: Normal judgment and insight. Alert and oriented x 3. Normal mood.   Data Reviewed:   EKG revealed a dual paced rhythm at 61 bpm.  Reviewed labs, imaging, and pertinent records as documented.  Assessment and Plan:  Melena Acute.  Patient presents wit reports of melena in the stools.  Stool guaiacs were noted to be positive.  Elevated BUN to suggest possible upper GI bleed Patient with prior colonoscopy in 2016 which revealed single 5 mm polyp which was removed noted to be inflammatory polyp with changes of trauma/prolapse with no malignancy identified.  During this year patient also underwent EGD which revealed erythema of the lower third of the esophagus without any other acute abnormality.  Patient was typed and screened for possible need of blood products.  [facility] had been consulted. - Admit to a telemetry bed  - N.p.o. after midnight - Check GI panel and C. difficile - Serial monitoring of H&H - Continue Protonix  IV twice daily - Appreciate [facility] consultative services, will follow-up for any further recommendations  Paroxysmal atrial fibrillation/flutter on chronic anticoagulation Patient last reports taking Eliquis  yesterday morning - Hold Eliquis   Essential hypertension Blood pressures currently noted to be maintained. - Held blood pressure regimen in the setting of concern for GI bleed.  Uncontrolled diabetes mellitus type 2, with long-term use of insulin  On admission glucose noted to be 151.  Last available hemoglobin A1c noted to be 8.3 when checked 08/15/2023. - Hypoglycemic protocols - Continue Farxiga  - CBGs before every meal with sensitive SSI - Plan to resume home insulin  regimen following procedure  Sick sinus syndrome s/p PPM Patient's status post [facility] Jude pacemaker implanted  10/25/2014  Diastolic congestive heart failure Patient appears to be fairly euvolemic at this time.  Last echocardiogram noted EF to be 60 to 65% with grade 1 diastolic dysfunction when checked last on 02/28/2024. - Determine when medically appropriate to resume home medication regimen    Obesity, class II BMP 39.08 kg/m   DVT prophylaxis: SCDs Advance Care Planning:   Code Status: Full Code    Consults: GI consulted  Family Communication: Daughter updated at bedside  Severity of Illness: The appropriate patient status for this patient is OBSERVATION. Observation status is judged to be reasonable and necessary in order to provide the required intensity of service to ensure the patient's safety. The patient's presenting symptoms, physical exam findings, and initial radiographic and laboratory data in the context of their medical condition is felt to place them at decreased risk for further clinical deterioration. Furthermore, it is anticipated that the patient will be medically stable for discharge from the hospital within 2 midnights of admission.   Author: Maximino DELENA Sharps, MD 09/14/2024 8:13 AM  For on call review [email or web address].      [1]  Allergies Allergen Reactions   Penicillin G Anaphylaxis   Penicillins Anaphylaxis   Quinapril  Anaphylaxis    Angioedema   Other     Other Reaction(s): irritation of skin   Piroxicam Other (See Comments)    Bleeding  Other Reaction(s): blood in stool   Calcium -Containing Compounds Other (See Comments)    calcium  deposits on skin   Celecoxib Itching   Oxaprozin Itching and Other (See Comments)   "

## 2024-09-14 NOTE — H&P (View-Only) (Signed)
 [facility] Consult  Referring Provider: ER Primary Care Physician:  Ricky Alfrieda DASEN, DO Primary Gastroenterologist: Dr. Celestia  Reason for Consultation: Black stool, diarrhea  HPI: Tracey Todd is a 89 y.o. female was in her usual state of health until last week when she noticed that stools were loose, she would have 4-5 bowel movements a day and had a very malodorous smell to it.  This was not associated with nausea, vomiting but she had chills, felt cold and weak.  She was seen by her primary care physician yesterday who did a rectal exam and advised her to get labs performed and see GI for concerns of melena. When she went home, she developed nosebleeds and appeared to cough up streaks of blood which made her daughter called EMS and bring the patient to the hospital.  Patient states normally she has 1 bowel movement a day and stools are brown. She has not vomited blood and denies noticing fresh blood in stool. Denies acid reflux, heartburn, difficulty swallowing, pain on swallowing, abdominal or rectal pain, unintentional weight loss or loss of appetite. She takes Eliquis  twice a day and last dose was yesterday morning. Denies use of aspirin  or NSAIDs.  Labs with [facility] on 08/08/2024 showed hemoglobin 12.5.  Previous GI workup: EGD, 2016, epigastric abdominal pain, Dr. Dallas: GERD without esophagitis Colonoscopy, 2016, surveillance, Dr. Celestia: Inflammatory descending polyp removed, no microscopic colitis, repeat not recommended due to age. EGD, 2014, Dr. Celestia, right upper quadrant abdominal pain, heme positive stool: Unremarkable Colonoscopy, 2010, family history of colon cancer in father: Unremarkable, repeat recommended in 5 years  Past Medical History:  Diagnosis Date   Atrial flutter (HCC)    a. mentioned in 2016 admission.   Chronic diastolic CHF (congestive heart failure) (HCC)    DJD (degenerative joint disease)    Fatty liver    H/O total knee replacement  02/13/2013   HTN (hypertension)    Hypercholesteremia    Mild CAD    a. minimal by cath 2016.   Nodule of chest wall    Normal coronary arteries and LVF 10/23/14 10/24/2014   Pacemaker implanted 10/25/14- St Jude 10/26/2014   Paroxysmal atrial fibrillation (HCC)    a. identified on device interrogation, burden low   Pulmonary hypertension (HCC)    Sick sinus syndrome (HCC)    a. s/p STJ dual chamber PPM    Type II or unspecified type diabetes mellitus without mention of complication, not stated as uncontrolled    Vitamin D  deficiency     Past Surgical History:  Procedure Laterality Date   BREAST SURGERY     CATARACT EXTRACTION     LEFT AND RIGHT HEART CATHETERIZATION WITH CORONARY ANGIOGRAM N/A 10/23/2014   Procedure: LEFT AND RIGHT HEART CATHETERIZATION WITH CORONARY ANGIOGRAM;  Surgeon: Ozell JONETTA Fell, MD;  Location: [facility] CATH LAB;  Service: Cardiovascular;  Laterality: N/A;   PERMANENT PACEMAKER INSERTION N/A 10/25/2014   STJ dual chamber PPM implanted by Dr Kelsie for SSS   TONSILLECTOMY AND ADENOIDECTOMY     TOTAL KNEE ARTHROPLASTY      Prior to Admission medications  Medication Sig Start Date End Date Taking? Authorizing Provider  acetaminophen  (TYLENOL ) 500 MG tablet Take 1 tablet (500 mg total) by mouth every 6 (six) hours as needed. 11/14/23  Yes Charlyn Sora, MD  atorvastatin  (LIPITOR) 80 MG tablet Take 1 tablet by mouth once daily 09/03/24  Yes Weaver, Scott T, PA-C  bisoprolol  (ZEBETA ) 10 MG tablet Take 1 tablet  by mouth once daily for blood pressure 03/23/24  Yes Massey, Amy J, NP  brimonidine  (ALPHAGAN ) 0.2 % ophthalmic solution Place 1 drop into both eyes 2 (two) times daily. 05/06/23  Yes [provider]  calcium  carbonate (OS-CAL) 600 MG TABS tablet Take 600 mg by mouth 2 (two) times daily with a meal.   Yes [provider]  cetirizine (ZYRTEC) 10 MG chewable tablet Chew 10 mg by mouth daily as needed for allergies.   Yes [provider]   Cholecalciferol (VITAMIN D ) 2000 UNITS CAPS Take 6,000 Units by mouth daily.    Yes [provider]  dapagliflozin  propanediol (FARXIGA ) 10 MG TABS tablet Take 1 tablet (10 mg total) by mouth daily before breakfast. 07/03/24  Yes Lucien, Tessa N, PA-C  fluticasone  (FLONASE ) 50 MCG/ACT nasal spray Place 2 sprays into both nostrils 2 (two) times daily. 09/27/23  Yes Tobie Comp B, MD  gabapentin  (NEURONTIN ) 300 MG capsule Take 1 capsule (300 mg total) by mouth 2 (two) times daily. 01/11/23  Yes Patel, Donika K, DO  insulin  glargine (LANTUS  SOLOSTAR) 100 UNIT/ML Solostar Pen Inject 14 Units into the skin at bedtime.   Yes [provider]  Magnesium  500 MG TABS Take 1,000 mg by mouth in the morning and at bedtime. 2 tabs am, 2 tab pm   Yes [provider]  Semaglutide,0.25 or 0.5MG /DOS, (OZEMPIC, 0.25 OR 0.5 MG/DOSE,) 2 MG/3ML SOPN Inject 1 mg into the skin every Sunday. 04/21/23  Yes [provider]  spironolactone  (ALDACTONE ) 25 MG tablet Take 1/2 (one-half) tablet by mouth once daily 08/14/24  Yes Chandrasekhar, Mahesh A, MD  torsemide  (DEMADEX ) 20 MG tablet Take 2 tablets (40 mg) by mouth daily in the mornings, Take 1 tablet (20 mg) by mouth on Monday, Wednesday, and Friday in the afternoon. Patient taking differently: Take 10-20 mg by mouth See admin instructions. Take 2 tablets by mouth in the morning and 1 tablet in the evening 09/22/23  Yes Weaver, Scott T, PA-C  apixaban  (ELIQUIS ) 5 MG TABS tablet Take 1 tablet (5 mg total) by mouth 2 (two) times daily. Patient not taking: Reported on 09/14/2024 07/25/24   Santo Stanly LABOR, MD  apixaban  (ELIQUIS ) 5 MG TABS tablet Take 1 tablet (5 mg total) by mouth 2 (two) times daily. Patient not taking: Reported on 09/14/2024 07/25/24   Santo Stanly A, MD  Blood Glucose Monitoring Suppl (ONETOUCH VERIO) w/Device KIT Use to check fasting blood sugar daily. Dx: E11.59 08/15/19   Theotis Haze ORN, NP  glucose blood  (ONETOUCH VERIO) test strip USE TO CHECK FASTING BLOOD SUGAR TWICE DAILY 09/14/22   Jaycee Greig PARAS, NP  Lancets [facility] DELICA PLUS LANCET33G) MISC USE TO CHECK FASTING BLOOD SUGAR TWICE DAILY 09/14/22   Jaycee Greig PARAS, NP  MM PEN NEEDLES 32G X 4 MM MISC  09/12/23   [provider]    No current facility-administered medications for this encounter.   Current Outpatient Medications  Medication Sig Dispense Refill   acetaminophen  (TYLENOL ) 500 MG tablet Take 1 tablet (500 mg total) by mouth every 6 (six) hours as needed. 30 tablet 0   atorvastatin  (LIPITOR) 80 MG tablet Take 1 tablet by mouth once daily 90 tablet 0   bisoprolol  (ZEBETA ) 10 MG tablet Take 1 tablet by mouth once daily for blood pressure 90 tablet 0   brimonidine  (ALPHAGAN ) 0.2 % ophthalmic solution Place 1 drop into both eyes 2 (two) times daily.     calcium  carbonate (OS-CAL)  600 MG TABS tablet Take 600 mg by mouth 2 (two) times daily with a meal.     cetirizine (ZYRTEC) 10 MG chewable tablet Chew 10 mg by mouth daily as needed for allergies.     Cholecalciferol (VITAMIN D ) 2000 UNITS CAPS Take 6,000 Units by mouth daily.      dapagliflozin  propanediol (FARXIGA ) 10 MG TABS tablet Take 1 tablet (10 mg total) by mouth daily before breakfast. 90 tablet 3   fluticasone  (FLONASE ) 50 MCG/ACT nasal spray Place 2 sprays into both nostrils 2 (two) times daily. 16 g 6   gabapentin  (NEURONTIN ) 300 MG capsule Take 1 capsule (300 mg total) by mouth 2 (two) times daily. 180 capsule 3   insulin  glargine (LANTUS  SOLOSTAR) 100 UNIT/ML Solostar Pen Inject 14 Units into the skin at bedtime.     Magnesium  500 MG TABS Take 1,000 mg by mouth in the morning and at bedtime. 2 tabs am, 2 tab pm     Semaglutide,0.25 or 0.5MG /DOS, (OZEMPIC, 0.25 OR 0.5 MG/DOSE,) 2 MG/3ML SOPN Inject 1 mg into the skin every Sunday.     spironolactone  (ALDACTONE ) 25 MG tablet Take 1/2 (one-half) tablet by mouth once daily 45 tablet 3   torsemide  (DEMADEX ) 20 MG tablet  Take 2 tablets (40 mg) by mouth daily in the mornings, Take 1 tablet (20 mg) by mouth on Monday, Wednesday, and Friday in the afternoon. (Patient taking differently: Take 10-20 mg by mouth See admin instructions. Take 2 tablets by mouth in the morning and 1 tablet in the evening)     apixaban  (ELIQUIS ) 5 MG TABS tablet Take 1 tablet (5 mg total) by mouth 2 (two) times daily. (Patient not taking: Reported on 09/14/2024) 60 tablet 0   apixaban  (ELIQUIS ) 5 MG TABS tablet Take 1 tablet (5 mg total) by mouth 2 (two) times daily. (Patient not taking: Reported on 09/14/2024) 60 tablet 5   Blood Glucose Monitoring Suppl (ONETOUCH VERIO) w/Device KIT Use to check fasting blood sugar daily. Dx: E11.59 1 kit 0   glucose blood (ONETOUCH VERIO) test strip USE TO CHECK FASTING BLOOD SUGAR TWICE DAILY 100 each 11   Lancets (ONETOUCH DELICA PLUS LANCET33G) MISC USE TO CHECK FASTING BLOOD SUGAR TWICE DAILY 100 each 2   MM PEN NEEDLES 32G X 4 MM MISC       Allergies as of 09/14/2024 - Review Complete 09/14/2024  Allergen Reaction Noted   Penicillin g Anaphylaxis 02/25/2023   Penicillins Anaphylaxis 03/01/2011   Quinapril  Anaphylaxis 06/05/2018   Other  02/25/2023   Piroxicam Other (See Comments) 12/14/2011   Calcium -containing compounds Other (See Comments) 12/14/2011   Celecoxib Itching 12/14/2011   Oxaprozin Itching and Other (See Comments) 12/14/2011    Family History  Problem Relation Age of Onset   Diabetes Mother    Cancer Mother    Cancer Father    Breast cancer Neg Hx     Social History   Socioeconomic History   Marital status: Married    Spouse name: Not on file   Number of children: Not on file   Years of education: Not on file   Highest education level: Not on file  Occupational History   Not on file  Tobacco Use   Smoking status: Former    Current packs/day: 0.00    Types: Cigarettes    Quit date: 12/06/1963    Years since quitting: 60.8    Passive exposure: Never   Smokeless  tobacco: Never  Vaping Use  Vaping status: Never Used  Substance and Sexual Activity   Alcohol use: No   Drug use: Never   Sexual activity: Not Currently  Other Topics Concern   Not on file  Social History Narrative   Left Handed    Lives in a one story home       Loss her husband 10/03/22   Social Drivers of Health   Tobacco Use: Medium Risk (05/24/2024)   Patient History    Smoking Tobacco Use: Former    Smokeless Tobacco Use: Never    Passive Exposure: Never  Physicist, Medical Strain: Not on file  Food Insecurity: Not on file  Transportation Needs: Not on file  Physical Activity: Not on file  Stress: Not on file  Social Connections: Not on file  Intimate Partner Violence: Not on file  Depression (PHQ2-9): Low Risk (12/27/2023)   Depression (PHQ2-9)    PHQ-2 Score: 2  Alcohol Screen: Not on file  Housing: Not on file  Utilities: Not on file  Health Literacy: Not on file    Review of Systems: As per HPI  Physical Exam: Vital signs in last 24 hours: Temp:  [98.8 F (37.1 C)-99.3 F (37.4 C)] 99.3 F (37.4 C) (01/16 0630) Pulse Rate:  [59-68] 59 (01/16 0630) Resp:  [14-16] 15 (01/16 0630) BP: (111-132)/(52-53) 111/53 (01/16 0630) SpO2:  [96 %-99 %] 97 % (01/16 0630) Weight:  [84.8 kg] 84.8 kg (01/16 0405)    General:   Alert,  Well-developed, overweight, pleasant and cooperative in NAD Head:  Normocephalic and atraumatic. Eyes:  Sclera clear, no icterus.   Conjunctiva pink. Ears:  Normal auditory acuity. Nose:  No deformity, discharge,  or lesions. Mouth:  No deformity or lesions.  Oropharynx pink & moist. Neck:  Supple; no masses or thyromegaly. Lungs:  Clear throughout to auscultation.   No wheezes, crackles, or rhonchi. No acute distress. Heart:  Regular rate and rhythm; no murmurs, clicks, rubs,  or gallops. Extremities: Postsurgical scar from prior knee replacement, without clubbing or edema. Neurologic:  Alert and  oriented x4;  grossly normal  neurologically. Skin:  Intact without significant lesions or rashes. Psych:  Alert and cooperative. Normal mood and affect. Abdomen:  Soft, nontender and nondistended. No masses, hepatosplenomegaly or hernias noted. Normal bowel sounds, without guarding, and without rebound.         Lab Results: Recent Labs    09/14/24 0456  WBC 8.7  HGB 12.3  HCT 37.9  PLT 257   BMET Recent Labs    09/14/24 0426  NA 139  K 4.1  CL 98  CO2 28  GLUCOSE 151*  BUN 29*  CREATININE 0.98  CALCIUM  9.8   LFT Recent Labs    09/14/24 0426  PROT 7.7  ALBUMIN 4.0  AST 26  ALT 11  ALKPHOS 65  BILITOT 0.6   PT/INR Recent Labs    09/14/24 0456  LABPROT 15.2  INR 1.1    Studies/Results: No results found.  Impression: Black tarry stool, diarrhea, elevated BUN/creatinine ratio suspicious for upper GI bleeding hemoglobin normal at 12.3, MCV 86.5, PT and INR normal 15.2/1.1 Stable hemodynamics, blood pressure 118/51, heart rate 54 Has been given Protonix  80mg  IV x 1 and is on Protonix  40 mg every 12 hours  Diarrhea?  Infectious  Atrial fibrillation on Eliquis , last dose yesterday morning 09/13/2024   Co-morbidities: CAD, atrial fibrillation/flutter, sick sinus syndrome, diastolic heart failure, pulmonary hypertension, hypertension, dyslipidemia, diabetes  Lab abnormalities: Elevated BUN 29 with normal  creatinine and limited low GFR at 56  Plan: Clear liquid diet today, n.p.o. postmidnight for EGD tomorrow in a.m. with Dr. Kriss.  Stool studies for C. difficile and GI pathogen panel.     LOS: 0 days   Estelita Manas, MD  09/14/2024, 7:53 AM

## 2024-09-14 NOTE — ED Triage Notes (Signed)
 Pt bib [facility] due to dark stool, right sided flank pain since Wednesday. Pt started coughing up blood yesterday. Pt took last dose of Eliquis  yesterday morning.

## 2024-09-15 ENCOUNTER — Observation Stay (HOSPITAL_COMMUNITY): Admitting: Anesthesiology

## 2024-09-15 ENCOUNTER — Encounter (HOSPITAL_COMMUNITY): Admission: EM | Disposition: A | Payer: Self-pay | Source: Home / Self Care | Attending: Internal Medicine

## 2024-09-15 ENCOUNTER — Encounter (HOSPITAL_COMMUNITY): Payer: Self-pay | Admitting: Internal Medicine

## 2024-09-15 DIAGNOSIS — I110 Hypertensive heart disease with heart failure: Secondary | ICD-10-CM | POA: Diagnosis present

## 2024-09-15 DIAGNOSIS — I480 Paroxysmal atrial fibrillation: Secondary | ICD-10-CM | POA: Diagnosis present

## 2024-09-15 DIAGNOSIS — Z950 Presence of cardiac pacemaker: Secondary | ICD-10-CM | POA: Diagnosis not present

## 2024-09-15 DIAGNOSIS — K920 Hematemesis: Secondary | ICD-10-CM | POA: Diagnosis present

## 2024-09-15 DIAGNOSIS — K760 Fatty (change of) liver, not elsewhere classified: Secondary | ICD-10-CM | POA: Diagnosis present

## 2024-09-15 DIAGNOSIS — Z833 Family history of diabetes mellitus: Secondary | ICD-10-CM | POA: Diagnosis not present

## 2024-09-15 DIAGNOSIS — I4892 Unspecified atrial flutter: Secondary | ICD-10-CM | POA: Diagnosis present

## 2024-09-15 DIAGNOSIS — Z7985 Long-term (current) use of injectable non-insulin antidiabetic drugs: Secondary | ICD-10-CM | POA: Diagnosis not present

## 2024-09-15 DIAGNOSIS — Z87891 Personal history of nicotine dependence: Secondary | ICD-10-CM | POA: Diagnosis not present

## 2024-09-15 DIAGNOSIS — K2970 Gastritis, unspecified, without bleeding: Secondary | ICD-10-CM | POA: Diagnosis present

## 2024-09-15 DIAGNOSIS — I495 Sick sinus syndrome: Secondary | ICD-10-CM | POA: Diagnosis present

## 2024-09-15 DIAGNOSIS — I5032 Chronic diastolic (congestive) heart failure: Secondary | ICD-10-CM | POA: Diagnosis present

## 2024-09-15 DIAGNOSIS — I272 Pulmonary hypertension, unspecified: Secondary | ICD-10-CM | POA: Diagnosis present

## 2024-09-15 DIAGNOSIS — I2510 Atherosclerotic heart disease of native coronary artery without angina pectoris: Secondary | ICD-10-CM

## 2024-09-15 DIAGNOSIS — E1165 Type 2 diabetes mellitus with hyperglycemia: Secondary | ICD-10-CM | POA: Diagnosis present

## 2024-09-15 DIAGNOSIS — Z79899 Other long term (current) drug therapy: Secondary | ICD-10-CM | POA: Diagnosis not present

## 2024-09-15 DIAGNOSIS — Z794 Long term (current) use of insulin: Secondary | ICD-10-CM | POA: Diagnosis not present

## 2024-09-15 DIAGNOSIS — K31811 Angiodysplasia of stomach and duodenum with bleeding: Secondary | ICD-10-CM | POA: Diagnosis present

## 2024-09-15 DIAGNOSIS — E7800 Pure hypercholesterolemia, unspecified: Secondary | ICD-10-CM | POA: Diagnosis present

## 2024-09-15 DIAGNOSIS — K921 Melena: Secondary | ICD-10-CM | POA: Diagnosis present

## 2024-09-15 DIAGNOSIS — E66812 Obesity, class 2: Secondary | ICD-10-CM | POA: Diagnosis present

## 2024-09-15 DIAGNOSIS — K219 Gastro-esophageal reflux disease without esophagitis: Secondary | ICD-10-CM | POA: Diagnosis present

## 2024-09-15 DIAGNOSIS — K31819 Angiodysplasia of stomach and duodenum without bleeding: Secondary | ICD-10-CM

## 2024-09-15 DIAGNOSIS — D62 Acute posthemorrhagic anemia: Secondary | ICD-10-CM | POA: Diagnosis present

## 2024-09-15 DIAGNOSIS — Z7901 Long term (current) use of anticoagulants: Secondary | ICD-10-CM | POA: Diagnosis not present

## 2024-09-15 DIAGNOSIS — F32A Depression, unspecified: Secondary | ICD-10-CM | POA: Diagnosis present

## 2024-09-15 HISTORY — PX: HEMOSTASIS CLIP PLACEMENT: SHX6857

## 2024-09-15 HISTORY — PX: BIOPSY OF SKIN SUBCUTANEOUS TISSUE AND/OR MUCOUS MEMBRANE: SHX6741

## 2024-09-15 HISTORY — PX: ESOPHAGOGASTRODUODENOSCOPY: SHX5428

## 2024-09-15 LAB — BASIC METABOLIC PANEL WITH GFR
Anion gap: 11 (ref 5–15)
BUN: 11 mg/dL (ref 8–23)
CO2: 26 mmol/L (ref 22–32)
Calcium: 9.4 mg/dL (ref 8.9–10.3)
Chloride: 102 mmol/L (ref 98–111)
Creatinine, Ser: 0.65 mg/dL (ref 0.44–1.00)
GFR, Estimated: >60 mL/min
Glucose, Bld: 106 mg/dL — ABNORMAL HIGH (ref 70–99)
Potassium: 3.4 mmol/L — ABNORMAL LOW (ref 3.5–5.1)
Sodium: 138 mmol/L (ref 135–145)

## 2024-09-15 LAB — GLUCOSE, CAPILLARY
Glucose-Capillary: 103 mg/dL — ABNORMAL HIGH (ref 70–99)
Glucose-Capillary: 129 mg/dL — ABNORMAL HIGH (ref 70–99)
Glucose-Capillary: 101 mg/dL — ABNORMAL HIGH (ref 70–99)
Glucose-Capillary: 95 mg/dL (ref 70–99)

## 2024-09-15 LAB — CBC
HCT: 35.5 % — ABNORMAL LOW (ref 36.0–46.0)
Hemoglobin: 11.5 g/dL — ABNORMAL LOW (ref 12.0–15.0)
MCH: 28 pg (ref 26.0–34.0)
MCHC: 32.4 g/dL (ref 30.0–36.0)
MCV: 86.4 fL (ref 80.0–100.0)
Platelets: 244 K/uL (ref 150–400)
RBC: 4.11 MIL/uL (ref 3.87–5.11)
RDW: 15.9 % — ABNORMAL HIGH (ref 11.5–15.5)
WBC: 7.6 K/uL (ref 4.0–10.5)
nRBC: 0 % (ref 0.0–0.2)

## 2024-09-15 LAB — HEMOGLOBIN A1C
Hgb A1c MFr Bld: 7.4 % — ABNORMAL HIGH (ref 4.8–5.6)
Mean Plasma Glucose: 165.68 mg/dL

## 2024-09-15 MED ORDER — BACITRACIN-POLYMYXIN B 500-10000 UNIT/GM OP OINT
TOPICAL_OINTMENT | Freq: Two times a day (BID) | OPHTHALMIC | Status: DC
  Filled 2024-09-15: qty 3.5

## 2024-09-15 MED ORDER — ONDANSETRON HCL 4 MG/2ML IJ SOLN
INTRAMUSCULAR | Status: DC | PRN
  Administered 2024-09-15: 4 mg via INTRAVENOUS

## 2024-09-15 MED ORDER — TORSEMIDE 20 MG PO TABS
20.0000 mg | ORAL_TABLET | Freq: Every day | ORAL | Status: DC
  Administered 2024-09-15: 20 mg via ORAL
  Filled 2024-09-15: qty 1

## 2024-09-15 MED ORDER — TORSEMIDE 20 MG PO TABS
40.0000 mg | ORAL_TABLET | Freq: Every day | ORAL | Status: DC
  Administered 2024-09-16: 40 mg via ORAL
  Filled 2024-09-15: qty 2

## 2024-09-15 MED ORDER — SODIUM CHLORIDE 0.9 % IV SOLN: INTRAVENOUS | Status: DC | PRN

## 2024-09-15 MED ORDER — PROPOFOL 500 MG/50ML IV EMUL
INTRAVENOUS | Status: DC | PRN
  Administered 2024-09-15: 150 ug/kg/min via INTRAVENOUS

## 2024-09-15 NOTE — Evaluation (Signed)
 Physical Therapy Evaluation Patient Details Name: Tracey Todd MRN: [id number] DOB: 17-Oct-1934 Today's Date: 09/15/2024  History of Present Illness  Patient is 89 yo female admitted on 09/14/24 with reports of blood in her stools and coughing up blood. PMH significant for HTN, hyperlipidemia, diastolic congestive heart failure, atrial fibrillation/flutter, sick sinus syndrome s/p pacemaker, pulmonary hypertension, CAD, and T2DM.  Clinical Impression  Prior to admission, patient lives with daughter in one level home with ramped entrance. Patient reports sleeping in a recliner with BSC access and modI for household ambulation with 4WW. She presents today with impaired B LE strength, poor activity tolerance, and difficulty with functional mobility. Requires modA for bed mobility and minA for lateral stepping at EOB. Patient will be going for procedure today, will continue to benefit from skilled acute PT in order to progress home with family assist.        If plan is discharge home, recommend the following: A lot of help with bathing/dressing/bathroom;A lot of help with walking and/or transfers;Assist for transportation;Assistance with cooking/housework;Help with stairs or ramp for entrance   Can travel by private vehicle        Equipment Recommendations None recommended by PT;Other (comment) (Patient has 4WW and BSC at home.)  Recommendations for Other Services       Functional Status Assessment Patient has had a recent decline in their functional status and demonstrates the ability to make significant improvements in function in a reasonable and predictable amount of time.     Precautions / Restrictions Precautions Precautions: Fall;Other (comment) Recall of Precautions/Restrictions: Intact Precaution/Restrictions Comments: enteric precautions Restrictions Weight Bearing Restrictions Per Provider Order: No      Mobility  Bed Mobility Overal bed mobility: Needs Assistance Bed  Mobility: Supine to Sit, Sit to Supine     Supine to sit: Mod assist, HOB elevated, Used rails Sit to supine: Mod assist, Used rails   General bed mobility comments: modA for trunk righting and modA for assist with LEs upon return to bed, increased time    Transfers Overall transfer level: Needs assistance Equipment used: Rolling walker (2 wheels) Transfers: Sit to/from Stand Sit to Stand: Min assist           General transfer comment: minA for STS x2 from EOB with cues for hand placement    Ambulation/Gait Ambulation/Gait assistance: Min assist Gait Distance (Feet): 3 Feet (lateral stepping at EOB) Assistive device: Rolling walker (2 wheels) Gait Pattern/deviations: Decreased step length - right, Decreased step length - left, Decreased stance time - left, Decreased weight shift to left       General Gait Details: difficulty advancing LLE, quick fatigue  Stairs            Wheelchair Mobility     Tilt Bed    Modified Rankin (Stroke Patients Only)       Balance Overall balance assessment: Needs assistance Sitting-balance support: Bilateral upper extremity supported, Feet supported Sitting balance-Leahy Scale: Fair       Standing balance-Leahy Scale: Poor Standing balance comment: min-CGA at all times in standing position with 2WW                             Pertinent Vitals/Pain Pain Assessment Pain Assessment: 0-10 Pain Score: 2  Pain Location: B LE - quadriceps Pain Descriptors / Indicators: Aching Pain Intervention(s): Monitored during session    Home Living Family/patient expects to be discharged to:: Private residence Living Arrangements: Children (  daughter) Available Help at Discharge: Family Type of Home: House Home Access: Ramped entrance       Home Layout: One level Home Equipment: Rollator (4 wheels);BSC/3in1 Additional Comments: Patient reports sleeping in recliner with BSC placed beside for use. Daughter takes  patient to appointments, assists with bathing and dressing as needed. Patient is an independent ambulator with [phone] for household distances.    Prior Function Prior Level of Function : Needs assist       Physical Assist : ADLs (physical)   ADLs (physical): Bathing;Dressing;IADLs Mobility Comments: Reports one fall March 2025, sustaining spine fracture       Extremity/Trunk Assessment        Lower Extremity Assessment Lower Extremity Assessment: Generalized weakness       Communication   Communication Communication: No apparent difficulties    Cognition Arousal: Alert Behavior During Therapy: WFL for tasks assessed/performed   PT - Cognitive impairments: No apparent impairments                         Following commands: Intact       Cueing Cueing Techniques: Verbal cues, Gestural cues, Tactile cues     General Comments      Exercises     Assessment/Plan    PT Assessment Patient needs continued PT services  PT Problem List Decreased strength;Decreased balance;Pain;Decreased activity tolerance;Decreased mobility       PT Treatment Interventions DME instruction;Gait training;Therapeutic activities;Therapeutic exercise;Patient/family education;Balance training;Functional mobility training;Neuromuscular re-education    PT Goals (Current goals can be found in the Care Plan section)  Acute Rehab PT Goals Patient Stated Goal: Patient's goal is to be able to walk. PT Goal Formulation: With patient Time For Goal Achievement: 09/29/24 Potential to Achieve Goals: Good    Frequency Min 2X/week     Co-evaluation               AM-PAC PT 6 Clicks Mobility  Outcome Measure Help needed turning from your back to your side while in a flat bed without using bedrails?: A Little Help needed moving from lying on your back to sitting on the side of a flat bed without using bedrails?: A Little Help needed moving to and from a bed to a chair (including a  wheelchair)?: A Little Help needed standing up from a chair using your arms (e.g., wheelchair or bedside chair)?: A Little Help needed to walk in hospital room?: A Lot Help needed climbing 3-5 steps with a railing? : Total 6 Click Score: 15    End of Session Equipment Utilized During Treatment: Gait belt Activity Tolerance: Patient limited by fatigue Patient left: in bed;with bed alarm set;with call bell/phone within reach (RN present) Nurse Communication: Mobility status (B LE pain) PT Visit Diagnosis: Unsteadiness on feet (R26.81);Muscle weakness (generalized) (M62.81);History of falling (Z91.81);Other abnormalities of gait and mobility (R26.89);Difficulty in walking, not elsewhere classified (R26.2)    Time: 9146-9071 PT Time Calculation (min) (ACUTE ONLY): 35 min   Charges:   PT Evaluation $PT Eval Low Complexity: 1 Low PT Treatments $Therapeutic Activity: 8-22 mins PT General Charges $$ ACUTE PT VISIT: 1 Visit         Sherryle Center, PT, DPT [facility] Acute Rehabilitation Office: [phone]   Sherryle VEAR [place] 09/15/2024, 11:37 AM

## 2024-09-15 NOTE — Anesthesia Preprocedure Evaluation (Addendum)
"                                    Anesthesia Evaluation  Patient identified by MRN, date of birth, ID band Patient awake    Reviewed: Allergy & Precautions, NPO status , Patient's Chart, lab work & pertinent test results  Airway Mallampati: II  TM Distance: >3 FB Neck ROM: Limited    Dental  (+) Edentulous Upper, Edentulous Lower, Dental Advisory Given   Pulmonary former smoker   Pulmonary exam normal breath sounds clear to auscultation       Cardiovascular hypertension, pulmonary hypertension+ angina  + CAD and +CHF  + dysrhythmias Atrial Fibrillation + pacemaker + Valvular Problems/Murmurs (Mild-Mod TR)  Rhythm:Regular Rate:Normal  Echo 2025  1. Left ventricular ejection fraction, by estimation, is 60 to 65%. Left  ventricular ejection fraction by 3D volume is 61 %. The left ventricle has  normal function. The left ventricle has no regional wall motion  abnormalities. There is mild concentric left ventricular hypertrophy.  Left ventricular diastolic parameters are consistent with Grade I diastolic  dysfunction (impaired relaxation).   2. Right ventricular systolic function is normal. The right ventricular  size is mildly enlarged. There is mildly elevated pulmonary artery  systolic pressure. The estimated right ventricular systolic pressure is  40.2 mmHg.   3. The mitral valve is normal in structure. Trivial mitral valve  regurgitation.   4. Tricuspid valve regurgitation is mild to moderate.   5. The aortic valve is normal in structure. There is mild calcification  of the aortic valve. Aortic valve regurgitation is trivial.   Pacemaker interrogation ~99% RA and RV paced   Neuro/Psych  Neuromuscular disease    GI/Hepatic negative GI ROS, Neg liver ROS,,,  Endo/Other  diabetes  Class 3 obesity  Renal/GU Renal disease     Musculoskeletal  (+) Arthritis ,    Abdominal  (+) + obese  Peds  Hematology negative hematology ROS (+)   Anesthesia Other  Findings   Reproductive/Obstetrics                              Anesthesia Physical Anesthesia Plan  ASA: 4  Anesthesia Plan: MAC   Post-op Pain Management: Minimal or no pain anticipated   Induction:   PONV Risk Score and Plan: 2 and Ondansetron , Propofol  infusion, TIVA and Treatment may vary due to age or medical condition  Airway Management Planned: Natural Airway  Additional Equipment:   Intra-op Plan:   Post-operative Plan:   Informed Consent: I have reviewed the patients History and Physical, chart, labs and discussed the procedure including the risks, benefits and alternatives for the proposed anesthesia with the patient or authorized representative who has indicated his/her understanding and acceptance.     Dental advisory given  Plan Discussed with: CRNA  Anesthesia Plan Comments:          Anesthesia Quick Evaluation  "

## 2024-09-15 NOTE — Progress Notes (Addendum)
 " PROGRESS NOTE    Tracey Todd  FMW:[id number] DOB: 07-13-1935 DOA: 09/14/2024 PCP: Ricky Alfrieda DASEN, DO   Brief Narrative:   89 y.o. female with medical history significant of hypertension, hyperlipidemia, diastolic congestive heart failure, atrial fibrillation/flutter, sick sinus syndrome s/p pacemaker, pulmonary hypertension, CAD, and diabetes mellitus type 2 who presents with reports of blood in her stools and coughing up blood.  Eliquis  held GI consulted Plan for EGD to be done on 09/15/24  Assessment & Plan:  Principal Problem:   Melena Active Problems:   Paroxysmal atrial fibrillation (HCC)   Essential hypertension   Uncontrolled type 2 diabetes mellitus with hyperglycemia, with long-term current use of insulin  (HCC)   Sick sinus syndrome (HCC)   Chronic diastolic CHF (congestive heart failure) (HCC)   Obesity, Class II, BMI 35-39.9   Melena/acute blood loss anemia,POA: Elevated BUN to suggest possible upper GI bleed Patient with prior colonoscopy in 2016 which revealed single 5 mm polyp which was removed noted to be inflammatory polyp with changes of trauma/prolapse with no malignancy identified.  During this year patient also underwent EGD which revealed erythema of the lower third of the esophagus without any other acute abnormality.  Patient was typed and screened for possible need of blood products.  [facility] had been consulted. - N.p.o. after midnight - Serial monitoring of H&H. 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 Okay to restart [facility] tomorrow.   Paroxysmal atrial fibrillation/flutter on chronic anticoagulation - Hold Eliquis  in the setting of GI Bleed   Essential hypertension Monitor closely   Uncontrolled diabetes mellitus type 2, with long-term use of insulin  On admission glucose noted to be 151.  Last available hemoglobin A1c noted to be 8.3 when checked 08/15/2023. - Hypoglycemic protocols -  Continue Farxiga  - CBGs before every meal with sensitive SSI  Severe right hip osteoarthritis,POA: Needs replacement, f/u with orthopedics. Needs medical stabilization before she can undergo anesthesia.   Sick sinus syndrome s/p PPM Patient's status post [facility] Jude pacemaker implanted 10/25/2014   Diastolic congestive heart failure,chronic,POA: NYHA III Last echocardiogram 02/28/24 noted EF to be 60 to 65% with grade 1 diastolic dysfunction  - Restarted Torsemide  40 mg in am and 20 mg in the evening   Obesity, class II BMP 39.08 kg/m Likely a candidate for GLP-1RA initiation as an outpatient.  Disposition: Home, Lives with her grand son   DVT prophylaxis: SCDs Start: 09/14/24 0835     Code Status: Full Code Family Communication:  None at the bedside Status is: Observation The patient remains OBS appropriate and will d/c before 2 midnights.    Subjective:  No acute events overnight except slight left sided epistaxis. EGD to be done today.  Examination:  General exam: Appears calm and comfortable  Respiratory system: Clear to auscultation. Respiratory effort normal. Cardiovascular system: S1 & S2 heard, RRR. No JVD, murmurs, rubs, gallops or clicks. No pedal edema. Gastrointestinal system: Abdomen is nondistended, soft and nontender. No organomegaly or masses felt. Normal bowel sounds heard. Central nervous system: Alert and oriented. No focal neurological deficits. Extremities: Symmetric 5 x 5 power. Skin: No rashes, lesions or ulcers      Diet Orders (From admission, onward)     Start     Ordered   09/15/24 0001  Diet NPO time specified Except for: Sips with Meds  Diet effective midnight       Question:  Except for  AnswerBETHA Cost with Meds   09/14/24 1445            Objective: Vitals:   09/14/24 1635 09/14/24 1929 09/15/24 0427 09/15/24 0817  BP: (!) 119/54 (!) 130/53 (!) 124/48 (!) 124/50  Pulse: 66 68 60 63  Resp: 18 17 20 19   Temp: 98.7 F (37.1 C)  98.7 F (37.1 C) 98.5 F (36.9 C) 98 F (36.7 C)  TempSrc: Oral Oral    SpO2: 92% 95% 92% 93%  Weight:      Height:        Intake/Output Summary (Last 24 hours) at 09/15/2024 1230 Last data filed at 09/15/2024 0900 Gross per 24 hour  Intake 0 ml  Output 0 ml  Net 0 ml   Filed Weights   09/14/24 0405  Weight: 84.8 kg    Scheduled Meds:  atorvastatin   80 mg Oral Daily   brimonidine   1 drop Both Eyes BID   dapagliflozin  propanediol  10 mg Oral QAC breakfast   fluticasone   2 spray Each Nare BID   gabapentin   300 mg Oral BID   insulin  aspart  0-9 Units Subcutaneous TID WC   insulin  glargine  14 Units Subcutaneous QHS   pantoprazole  (PROTONIX ) IV  40 mg Intravenous Q12H   sodium chloride  flush  3 mL Intravenous Q12H   torsemide   20 mg Oral QPC supper   torsemide   40 mg Oral Daily   Continuous Infusions:  sodium chloride       Nutritional status     Body mass index is 39.08 kg/m.  Data Reviewed:   CBC: Recent Labs  Lab 09/14/24 0456 09/14/24 1000 09/14/24 1653 09/15/24 0504  WBC 8.7  --   --  7.6  NEUTROABS 6.2  --   --   --   HGB 12.3 11.6* 11.3* 11.5*  HCT 37.9 34.7* 34.2* 35.5*  MCV 86.5  --   --  86.4  PLT 257  --   --  244   Basic Metabolic Panel: Recent Labs  Lab 09/14/24 0426 09/15/24 0504  NA 139 138  K 4.1 3.4*  CL 98 102  CO2 28 26  GLUCOSE 151* 106*  BUN 29* 11  CREATININE 0.98 0.65  CALCIUM  9.8 9.4   GFR: Estimated Creatinine Clearance: 45.8 mL/min (by C-G formula based on SCr of 0.65 mg/dL). Liver Function Tests: Recent Labs  Lab 09/14/24 0426  AST 26  ALT 11  ALKPHOS 65  BILITOT 0.6  PROT 7.7  ALBUMIN 4.0   No results for input(s): LIPASE, AMYLASE in the last 168 hours. No results for input(s): AMMONIA in the last 168 hours. Coagulation Profile: Recent Labs  Lab 09/14/24 0456  INR 1.1   Cardiac Enzymes: No results for input(s): CKTOTAL, CKMB, CKMBINDEX, TROPONINI in the last 168 hours. BNP (last 3  results) No results for input(s): PROBNP in the last 8760 hours. HbA1C: Recent Labs    09/14/24 1922  HGBA1C 7.4*   CBG: Recent Labs  Lab 09/14/24 2104 09/15/24 0818 09/15/24 1119  GLUCAP 115* 103* 129*   Lipid Profile: No results for input(s): CHOL, HDL, LDLCALC, TRIG, CHOLHDL, LDLDIRECT in the last 72 hours. Thyroid  Function Tests: No results for input(s): TSH, T4TOTAL, FREET4, T3FREE, THYROIDAB in the last 72 hours. Anemia Panel: No results for input(s): VITAMINB12, FOLATE, FERRITIN, TIBC, IRON, RETICCTPCT in the last 72 hours. Sepsis Labs: No results for input(s): PROCALCITON, LATICACIDVEN in the last 168 hours.  No results found for this or any  previous visit (from the past 240 hours).       Radiology Studies: No results found.         LOS: 0 days   Time spent= 35 mins    Deliliah Room, MD [facility]  If 7PM-7AM, please contact night-coverage  09/15/2024, 12:30 PM  "

## 2024-09-15 NOTE — Anesthesia Postprocedure Evaluation (Signed)
"   Anesthesia Post Note  Patient: Tracey Todd  Procedure(s) Performed: EGD (ESOPHAGOGASTRODUODENOSCOPY) CONTROL OF HEMORRHAGE, GI TRACT, ENDOSCOPIC, BY CLIPPING OR OVERSEWING BIOPSY, SKIN, SUBCUTANEOUS TISSUE, OR MUCOUS MEMBRANE     Patient location during evaluation: PACU Anesthesia Type: MAC Level of consciousness: awake and alert Pain management: pain level controlled Vital Signs Assessment: post-procedure vital signs reviewed and stable Respiratory status: spontaneous breathing Cardiovascular status: stable Anesthetic complications: no   No notable events documented.  Last Vitals:  Vitals:   09/15/24 1415 09/15/24 1430  BP: 137/73 (!) 148/72  Pulse: 66 66  Resp: 19   Temp: 36.6 C   SpO2: 95% 95%    Last Pain:  Vitals:   09/15/24 1523  TempSrc:   PainSc: 3                  Clorine Swing      "

## 2024-09-15 NOTE — Transfer of Care (Signed)
 Immediate Anesthesia Transfer of Care Note  Patient: Tracey Todd  Procedure(s) Performed: EGD (ESOPHAGOGASTRODUODENOSCOPY) CONTROL OF HEMORRHAGE, GI TRACT, ENDOSCOPIC, BY CLIPPING OR OVERSEWING BIOPSY, SKIN, SUBCUTANEOUS TISSUE, OR MUCOUS MEMBRANE  Patient Location: PACU  Anesthesia Type:MAC  Level of Consciousness: awake, alert , and oriented  Airway & Oxygen Therapy: Patient Spontanous Breathing and Patient connected to nasal cannula oxygen  Post-op Assessment: Report given to RN and Post -op Vital signs reviewed and stable  Post vital signs: Reviewed and stable  Last Vitals:  Vitals Value Taken Time  BP    Temp    Pulse 63 09/15/24 14:17  Resp 19 09/15/24 14:17  SpO2 97 % 09/15/24 14:17  Vitals shown include unfiled device data.  Last Pain:  Vitals:   09/15/24 1331  TempSrc: Temporal  PainSc: 0-No pain         Complications: No notable events documented.

## 2024-09-15 NOTE — Interval H&P Note (Signed)
 History and Physical Interval Note:  09/15/2024 1:50 PM  Tracey Todd  has presented today for surgery, with the diagnosis of melena, diarrhea, on eliquid, last dose 09/13/24.  The various methods of treatment have been discussed with the patient and family. After consideration of risks, benefits and other options for treatment, the patient has consented to  Procedures: EGD (ESOPHAGOGASTRODUODENOSCOPY) (N/A) as a surgical intervention.  The patient's history has been reviewed, patient examined, no change in status, stable for surgery.  I have reviewed the patient's chart and labs.  Questions were answered to the patient's satisfaction.     Estefana VEAR Keas

## 2024-09-15 NOTE — Op Note (Signed)
 [facility] Patient Name: Tracey Todd Procedure Date : 09/15/2024 MRN: [id number] Attending MD: Estefana Keas DO, DO, [id number] Date of Birth: 1934/10/06 CSN: [id number] Age: 89 Admit Type: Inpatient Procedure:                Upper GI endoscopy Indications:              Melena Providers:                Estefana Keas DO, DO, Almarie Masters, RN, Felice Sar, Technician Referring MD:              Medicines:                See the Anesthesia note for documentation of the                            administered medications Complications:            No immediate complications. Estimated Blood Loss:     Estimated blood loss was minimal. Procedure:                Pre-Anesthesia Assessment:                           - ASA Grade Assessment: IV - A patient with severe                            systemic disease that is a constant threat to life.                           - The risks and benefits of the procedure and the                            sedation options and risks were discussed with the                            patient. All questions were answered and informed                            consent was obtained.                           After obtaining informed consent, the endoscope was                            passed under direct vision. Throughout the                            procedure, the patient's blood pressure, pulse, and                            oxygen saturations were monitored continuously. The                            [id number]-[id number] (  [id number]) Olympus endoscope was introduced                            through the mouth, and advanced to the second part                            of duodenum. The upper GI endoscopy was                            accomplished without difficulty. The patient                            tolerated the procedure well. Scope In: Scope Out: Findings:      The examined esophagus was normal.      The  Z-line was regular and was found 40 cm from the incisors.      Localized mild inflammation characterized by erythema was found in the       gastric fundus.      Scattered mild inflammation characterized by friability and granularity       was found in the gastric body and in the gastric antrum. Biopsies were       taken with a cold forceps for Helicobacter pylori testing.      A single 3 mm angiodysplastic lesion without bleeding was found in the       duodenal bulb. Coagulation for hemostasis using bipolar probe was       successful. Estimated blood loss was minimal.      The second portion of the duodenum was normal. Impression:               - Normal esophagus.                           - Z-line regular, 40 cm from the incisors.                           - Gastritis.                           - Gastritis. Biopsied.                           - A single non-bleeding angiodysplastic lesion in                            the duodenum. Treated with bipolar cautery.                           - Normal second portion of the duodenum. Recommendation:           - Return patient to hospital ward for ongoing care.                           - Resume regular diet.                           - Continue present medications.                           -  Await pathology results.                           - If hemoglobin stable, can consider resuming                            Eliquis  09/16/24.                           - If further melena, would recommend video capsule                            endoscopy. Procedure Code(s):        --- Professional ---                           [phone], Esophagogastroduodenoscopy, flexible,                            transoral; with biopsy, single or multiple Diagnosis Code(s):        --- Professional ---                           K29.70, Gastritis, unspecified, without bleeding                           K31.819, Angiodysplasia of stomach and duodenum                             without bleeding                           K92.1, Melena (includes Hematochezia) CPT copyright 2022 American Medical Association. All rights reserved. The codes documented in this report are preliminary and upon coder review may  be revised to meet current compliance requirements. Dr Estefana Keas, DO Estefana Keas DO, DO 09/15/2024 2:18:44 PM Number of Addenda: 0

## 2024-09-16 DIAGNOSIS — K921 Melena: Secondary | ICD-10-CM | POA: Diagnosis not present

## 2024-09-16 LAB — CBC WITH DIFFERENTIAL/PLATELET
Abs Immature Granulocytes: 0.02 K/uL (ref 0.00–0.07)
Basophils Absolute: 0.1 K/uL (ref 0.0–0.1)
Basophils Relative: 1 %
Eosinophils Absolute: 0.2 K/uL (ref 0.0–0.5)
Eosinophils Relative: 2 %
HCT: 34.2 % — ABNORMAL LOW (ref 36.0–46.0)
Hemoglobin: 11.3 g/dL — ABNORMAL LOW (ref 12.0–15.0)
Immature Granulocytes: 0 %
Lymphocytes Relative: 20 %
Lymphs Abs: 1.7 K/uL (ref 0.7–4.0)
MCH: 28.7 pg (ref 26.0–34.0)
MCHC: 33 g/dL (ref 30.0–36.0)
MCV: 86.8 fL (ref 80.0–100.0)
Monocytes Absolute: 1.1 K/uL — ABNORMAL HIGH (ref 0.1–1.0)
Monocytes Relative: 12 %
Neutro Abs: 5.5 K/uL (ref 1.7–7.7)
Neutrophils Relative %: 65 %
Platelets: 226 K/uL (ref 150–400)
RBC: 3.94 MIL/uL (ref 3.87–5.11)
RDW: 15.7 % — ABNORMAL HIGH (ref 11.5–15.5)
WBC: 8.6 K/uL (ref 4.0–10.5)
nRBC: 0 % (ref 0.0–0.2)

## 2024-09-16 LAB — GLUCOSE, CAPILLARY
Glucose-Capillary: 117 mg/dL — ABNORMAL HIGH (ref 70–99)
Glucose-Capillary: 275 mg/dL — ABNORMAL HIGH (ref 70–99)

## 2024-09-16 MED ORDER — APIXABAN 5 MG PO TABS
5.0000 mg | ORAL_TABLET | Freq: Two times a day (BID) | ORAL | Status: DC
  Administered 2024-09-16: 5 mg via ORAL
  Filled 2024-09-16: qty 1

## 2024-09-16 MED ORDER — TORSEMIDE 20 MG PO TABS: 10.0000 mg | ORAL_TABLET | ORAL | Status: AC

## 2024-09-16 MED ORDER — PANTOPRAZOLE SODIUM 40 MG PO TBEC: 40.0000 mg | DELAYED_RELEASE_TABLET | Freq: Every day | ORAL | 0 refills | Status: AC

## 2024-09-16 NOTE — Progress Notes (Signed)
 Physical Therapy Note  PT session complete with full note to follow;  Recommend going home with HHPT and HHOT follow up for transition out of hospital;    Silvano Currier, PT  [facility] [place] Office [phone]

## 2024-09-16 NOTE — Discharge Summary (Addendum)
 " Physician Discharge Summary   Patient: Tracey Todd MRN: [id number] DOB: 1935/06/09  Admit date:     09/14/2024  Discharge date: 09/16/24  Discharge Physician: Deliliah Room   PCP: Ricky Pax T, DO   Recommendations at discharge:    F/u with your PCP in one week F/u with GI in a month Return to ED if you develop dark stools , red blood in stools, coughing up blood etc.  Discharge Diagnoses: Principal Problem:   Melena Active Problems:   Paroxysmal atrial fibrillation (HCC)   Essential hypertension   Uncontrolled type 2 diabetes mellitus with hyperglycemia, with long-term current use of insulin  (HCC)   Sick sinus syndrome (HCC)   Chronic diastolic CHF (congestive heart failure) (HCC)   Obesity, Class II, BMI 35-39.9   Hematemesis   Hospital Course:  89 y.o. female with medical history significant of hypertension, hyperlipidemia, diastolic congestive heart failure, atrial fibrillation/flutter, sick sinus syndrome s/p pacemaker, pulmonary hypertension, CAD, and diabetes mellitus type 2 who presents with reports of blood in her stools and coughing up blood.  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 Okay to restart [facility] today as per GI as there is no evidence of active GI bleed and Hb is stable. -If further episodes of bleeding, she may need capsule endoscopy.   Paroxysmal atrial fibrillation/flutter on chronic anticoagulation - Resumed eliquis  on 1/18   Essential hypertension Monitor closely   Uncontrolled diabetes mellitus type 2, with long-term use of insulin    Last available hemoglobin A1c noted to be 8.3 when checked 08/15/2023. - Continue Farxiga  and home insulin  regimen   Severe right hip osteoarthritis,POA: Needs replacement, f/u with orthopedics. Needs medical stabilization before she can undergo anesthesia.   Sick sinus  syndrome s/p PPM Patient's status post [facility] Jude pacemaker implanted 10/25/2014   Diastolic congestive heart failure,chronic,POA: NYHA III Last echocardiogram 02/28/24 noted EF to be 60 to 65% with grade 1 diastolic dysfunction  - Restarted Torsemide  40 mg in am and 20 mg in the evening   Obesity, class II BMP 39.08 kg/m On Ozempic   Disposition: Home, Lives with her grand son. Home Health PT and OT arranged.  Spoke to her daughter at the bedside.       Consultants: GI Procedures performed: EGD  Disposition: Home health Diet recommendation:  Carb modified diet DISCHARGE MEDICATION: Allergies as of 09/16/2024       Reactions   Penicillin G Anaphylaxis   Penicillins Anaphylaxis   Quinapril  Anaphylaxis   Angioedema   Other    Other Reaction(s): irritation of skin   Piroxicam Other (See Comments)   Bleeding Other Reaction(s): blood in stool   Calcium -containing Compounds Other (See Comments)   calcium  deposits on skin   Celecoxib Itching   Oxaprozin Itching, Other (See Comments)        Medication List     TAKE these medications    acetaminophen  500 MG tablet Commonly known as: TYLENOL  Take 1 tablet (500 mg total) by mouth every 6 (six) hours as needed.   apixaban  5 MG Tabs tablet Commonly known as: ELIQUIS  Take 1 tablet (5 mg total) by mouth 2 (two) times daily.   apixaban  5 MG Tabs tablet Commonly known as: ELIQUIS  Take 1 tablet (5 mg total) by mouth 2 (two) times daily.   atorvastatin  80 MG tablet Commonly known as: LIPITOR  Take 1 tablet by mouth once daily   bisoprolol  10 MG tablet Commonly known as: ZEBETA  Take 1 tablet by mouth once daily for blood pressure   brimonidine  0.2 % ophthalmic solution Commonly known as: ALPHAGAN  Place 1 drop into both eyes 2 (two) times daily.   calcium  carbonate 600 MG Tabs tablet Commonly known as: OS-CAL Take 600 mg by mouth 2 (two) times daily with a meal.   dapagliflozin  propanediol 10 MG Tabs  tablet Commonly known as: FARXIGA  Take 1 tablet (10 mg total) by mouth daily before breakfast.   fluticasone  50 MCG/ACT nasal spray Commonly known as: FLONASE  Place 2 sprays into both nostrils 2 (two) times daily.   gabapentin  300 MG capsule Commonly known as: NEURONTIN  Take 1 capsule (300 mg total) by mouth 2 (two) times daily.   Lantus  SoloStar 100 UNIT/ML Solostar Pen Generic drug: insulin  glargine Inject 14 Units into the skin at bedtime.   Magnesium  500 MG Tabs Take 1,000 mg by mouth in the morning and at bedtime. 2 tabs am, 2 tab pm   MM Pen Needles 32G X 4 MM Misc Generic drug: Insulin  Pen Needle   OneTouch Delica Plus Lancet33G Misc USE TO CHECK FASTING BLOOD SUGAR TWICE DAILY   OneTouch Verio test strip Generic drug: glucose blood USE TO CHECK FASTING BLOOD SUGAR TWICE DAILY   OneTouch Verio w/Device Kit Use to check fasting blood sugar daily. Dx: E11.59   Ozempic (0.25 or 0.5 MG/DOSE) 2 MG/3ML Sopn Generic drug: Semaglutide(0.25 or 0.5MG /DOS) Inject 1 mg into the skin every Sunday.   pantoprazole  40 MG tablet Commonly known as: Protonix  Take 1 tablet (40 mg total) by mouth daily.   spironolactone  25 MG tablet Commonly known as: ALDACTONE  Take 1/2 (one-half) tablet by mouth once daily   torsemide  20 MG tablet Commonly known as: DEMADEX  Take 0.5-1 tablets (10-20 mg total) by mouth See admin instructions. Take 2 tablets by mouth in the morning and 1 tablet in the evening   Vitamin D  50 MCG (2000 UT) Caps Take 6,000 Units by mouth daily.   ZyrTEC 10 MG chewable tablet Generic drug: cetirizine Chew 10 mg by mouth daily as needed for allergies.        Follow-up Information     Ricky Pax T, DO. Schedule an appointment as soon as possible for a visit in 1 week(s).   Specialty: Family Medicine Contact information: [address], [address] [place] [address] [phone]         Kriss Estefana DEL, DO. Schedule an appointment as soon as  possible for a visit in 1 month(s).   Specialty: Gastroenterology Contact information: [address] [place] [address] [phone]                Discharge Exam: Fredricka Weights   09/14/24 0405  Weight: 84.8 kg   General exam: Appears calm and comfortable  Respiratory system: Clear to auscultation. Respiratory effort normal. Cardiovascular system: S1 & S2 heard, RRR. No JVD, murmurs, rubs, gallops or clicks. No pedal edema. Gastrointestinal system: Abdomen is nondistended, soft and nontender. No organomegaly or masses felt. Normal bowel sounds heard. Central nervous system: Alert and oriented. No focal neurological deficits. Extremities: Symmetric 5 x 5 power. Skin: No rashes, lesions or ulcers  Condition at discharge: good  The results of significant diagnostics from this hospitalization (including imaging, microbiology, ancillary and laboratory) are listed below for reference.   Imaging Studies: CUP PACEART REMOTE DEVICE CHECK Result Date:  08/27/2024 PPM Scheduled remote reviewed. Normal device function.  Presenting rhythm:  AP-VP. Battery estimated at 7 months remaining longevity. Next remote transmission per protocol. - CS, CVRS   Microbiology: Results for orders placed or performed in visit on 09/16/23  COVID-19, Flu A+B and RSV     Status: None   Collection Time: 09/16/23  4:13 PM   Specimen: Nose; Nasopharyngeal(NP) swabs in vial transport medium   Nasopharynge  Resident  Result Value Ref Range Status   SARS-CoV-2, NAA Not Detected Not Detected Final   Influenza A, NAA Not Detected Not Detected Final   Influenza B, NAA Not Detected Not Detected Final   RSV, NAA Not Detected Not Detected Final   Test Information: Comment  Final    Comment: This nucleic acid amplification test was developed and its performance characteristics determined by [employer]. Nucleic acid amplification tests include RT-PCR and TMA. This test has not been FDA  cleared or approved. This test has been authorized by FDA under an Emergency Use Authorization (EUA). This test is only authorized for the duration of time the declaration that circumstances exist justifying the authorization of the emergency use of in vitro diagnostic tests for detection of SARS-CoV-2 virus and/or diagnosis of COVID-19 infection under section 564(b)(1) of the Act, 21 U.S.C. 639aaa-6(a) (1), unless the authorization is terminated or revoked sooner. When diagnostic testing is negative, the possibility of a false negative result should be considered in the context of a patient's recent exposures and the presence of clinical signs and symptoms consistent with COVID-19. An individual without symptoms of COVID-19 and who is not shedding SARS-CoV-2 virus wo uld expect to have a negative (not detected) result in this assay.     Labs: CBC: Recent Labs  Lab 09/14/24 0456 09/14/24 1000 09/14/24 1653 09/15/24 0504 09/16/24 0807  WBC 8.7  --   --  7.6 8.6  NEUTROABS 6.2  --   --   --  5.5  HGB 12.3 11.6* 11.3* 11.5* 11.3*  HCT 37.9 34.7* 34.2* 35.5* 34.2*  MCV 86.5  --   --  86.4 86.8  PLT 257  --   --  244 226   Basic Metabolic Panel: Recent Labs  Lab 09/14/24 0426 09/15/24 0504  NA 139 138  K 4.1 3.4*  CL 98 102  CO2 28 26  GLUCOSE 151* 106*  BUN 29* 11  CREATININE 0.98 0.65  CALCIUM  9.8 9.4   Liver Function Tests: Recent Labs  Lab 09/14/24 0426  AST 26  ALT 11  ALKPHOS 65  BILITOT 0.6  PROT 7.7  ALBUMIN 4.0   CBG: Recent Labs  Lab 09/15/24 0818 09/15/24 1119 09/15/24 1420 09/15/24 1658 09/16/24 0904  GLUCAP 103* 129* 101* 95 117*    Discharge time spent: 40 minutes.  Signed: Deliliah Room, MD [facility] 09/16/2024 "

## 2024-09-16 NOTE — TOC Transition Note (Signed)
 Transition of Care (TOC) - Discharge Note Rayfield Gobble RN, BSN Inpatient Care Management Unit 4E- RN Case Manager See Treatment Team for direct phone # Weekend cross coverage  Patient Details  Name: Tracey Todd MRN: [id number] Date of Birth: 1935/08/15  Transition of Care [facility]) CM/SW Contact:  Gobble Rayfield Hurst, RN Phone Number: 09/16/2024, 12:52 PM   Clinical Narrative:    Noted HH orders placed, stable for transition home.  Went to room to speak with pt regarding HH- however staff had already taken pt out for transport home.   TC made to pt post discharge- daughter answered phone- explained HH orders- daughter states they would like services.  Choice reviewed per Per CMS guidelines from phonefinancing.pl website with star ratings (copy placed in shadow chart)- daughter has selected [facility] as first choice, [facility] and [facility] as backup.   Referrals sent in Hub, [facility] has accepted and confirmed per liaison.   Call made back to daughter to update.   IP CM interventions have been completed no further needs noted.   Final next level of care: Home w Home Health Services Barriers to Discharge: Barriers Resolved   Patient Goals and CMS Choice Patient states their goals for this hospitalization and ongoing recovery are:: return home CMS Medicare.gov Compare Post Acute Care list provided to:: Patient Choice offered to / list presented to : Adult Children      Discharge Placement                 Home w/ [facility]      Discharge Plan and Services Additional resources added to the After Visit Summary for     Discharge Planning Services: CM Consult Post Acute Care Choice: Home Health          DME Arranged: N/A DME Agency: NA       HH Arranged: OT, PT HH Agency: [facility] Date HH Agency Contacted: 09/16/24   Representative spoke with at [facility] Agency: [facility]  Social Drivers of Health (SDOH) Interventions SDOH Screenings    Food Insecurity: No Food Insecurity (09/14/2024)  Housing: Low Risk (09/14/2024)  Transportation Needs: No Transportation Needs (09/14/2024)  Utilities: Not At Risk (09/14/2024)  Depression (PHQ2-9): Low Risk (12/27/2023)  Social Connections: Moderately Integrated (09/14/2024)  Tobacco Use: Medium Risk (09/15/2024)     Readmission Risk Interventions    09/16/2024   12:52 PM  Readmission Risk Prevention Plan  Post Dischage Appt Complete  Medication Screening Complete  Transportation Screening Complete

## 2024-09-16 NOTE — Discharge Instructions (Signed)
 SABRA

## 2024-09-16 NOTE — Progress Notes (Signed)
 Physical Therapy Treatment Patient Details Name: SUPRINA MANDEVILLE MRN: [id number] DOB: [birth year] Today's Date: 09/16/2024   History of Present Illness Patient is 89 yo female admitted on 09/14/24 with reports of blood in her stools and coughing up blood. PMH significant for HTN, hyperlipidemia, diastolic congestive heart failure, atrial fibrillation/flutter, sick sinus syndrome s/p pacemaker, pulmonary hypertension, CAD, and T2DM.    PT Comments  Continuing work on functional mobility and activity tolerance;  Session focused on progressive amb, and working towards household distances in prep for possible dc home today; solid progress with distance, though still with fatigue; Discussed with pt's daughter, Haynes, who is well-versed  in caregiving; would like HHPT/OT to continue work on mobility and ADLs   If plan is discharge home, recommend the following: A lot of help with bathing/dressing/bathroom;A lot of help with walking and/or transfers;Assist for transportation;Assistance with cooking/housework;Help with stairs or ramp for entrance   Can travel by private vehicle        Equipment Recommendations  None recommended by PT (She does not think her rollator is a good fit; Will defer to HHPT to check out her rollator)    Recommendations for Other Services       Precautions / Restrictions Precautions Precautions: Fall;Other (comment) Recall of Precautions/Restrictions: Intact Precaution/Restrictions Comments: enteric precautions Restrictions Weight Bearing Restrictions Per Provider Order: No     Mobility  Bed Mobility Overal bed mobility: Needs Assistance Bed Mobility: Supine to Sit     Supine to sit: Mod assist, HOB elevated, Used rails     General bed mobility comments: modA for trunk righting    Transfers Overall transfer level: Needs assistance Equipment used: Rollator (4 wheels) Transfers: Sit to/from Stand Sit to Stand: Min assist, Contact guard assist            General transfer comment: Min assist to stand from EOB; CGA to stand from rollator; good use of brakes    Ambulation/Gait Ambulation/Gait assistance: Contact guard assist Gait Distance (Feet): 50 Feet Assistive device: Rollator (4 wheels) Gait Pattern/deviations: Decreased step length - right, Decreased step length - left, Decreased stance time - left, Decreased weight shift to left       General Gait Details: Audible crepitus bil hips; very slow steps; occasional cues for rollator proximity, but overall using rollator well; Incr time   Optometrist     Tilt Bed    Modified Rankin (Stroke Patients Only)       Balance     Sitting balance-Leahy Scale: Fair       Standing balance-Leahy Scale: Poor Standing balance comment: min-CGA at all times in standing position with Rollator                            Communication Communication Communication: No apparent difficulties  Cognition Arousal: Alert Behavior During Therapy: WFL for tasks assessed/performed   PT - Cognitive impairments: No apparent impairments                         Following commands: Intact      Cueing Cueing Techniques: Verbal cues, Gestural cues, Tactile cues  Exercises      General Comments General comments (skin integrity, edema, etc.): We opted to practice pushing herself with feet while sitting in rollator as a safe option for getting around when fatigued; Pt's daughter, Owens, present  at end of session      Pertinent Vitals/Pain Pain Assessment Pain Assessment: Faces Faces Pain Scale: No hurt Pain Intervention(s): Monitored during session    Home Living                          Prior Function            PT Goals (current goals can now be found in the care plan section) Acute Rehab PT Goals Patient Stated Goal: Patient's goal is to be able to walk; once home, she would like to work on being able to get in/out of  her bed PT Goal Formulation: With patient Time For Goal Achievement: 09/29/24 Potential to Achieve Goals: Good Progress towards PT goals: Progressing toward goals    Frequency    Min 2X/week      PT Plan      Co-evaluation              AM-PAC PT 6 Clicks Mobility   Outcome Measure  Help needed turning from your back to your side while in a flat bed without using bedrails?: A Little Help needed moving from lying on your back to sitting on the side of a flat bed without using bedrails?: A Little Help needed moving to and from a bed to a chair (including a wheelchair)?: A Little Help needed standing up from a chair using your arms (e.g., wheelchair or bedside chair)?: A Little Help needed to walk in hospital room?: A Little Help needed climbing 3-5 steps with a railing? : Total 6 Click Score: 16    End of Session Equipment Utilized During Treatment: Gait belt Activity Tolerance: Patient tolerated treatment well Patient left: in chair;with call bell/phone within reach;with chair alarm set Nurse Communication: Mobility status PT Visit Diagnosis: Unsteadiness on feet (R26.81);Muscle weakness (generalized) (M62.81);History of falling (Z91.81);Other abnormalities of gait and mobility (R26.89);Difficulty in walking, not elsewhere classified (R26.2)     Time: 9063-8961 PT Time Calculation (min) (ACUTE ONLY): 62 min  Charges:    $Gait Training: 23-37 mins $Therapeutic Activity: 8-22 mins PT General Charges $$ ACUTE PT VISIT: 1 Visit                     Silvano Currier, PT  Acute Rehabilitation Services Office [phone] Secure Chat welcomed    Silvano VEAR Currier 09/16/2024, 1:01 PM

## 2024-09-16 NOTE — Plan of Care (Signed)

## 2024-09-17 ENCOUNTER — Telehealth: Payer: Self-pay | Admitting: Pharmacy Technician

## 2024-09-17 NOTE — Telephone Encounter (Signed)
 SABRA

## 2024-09-18 ENCOUNTER — Encounter (HOSPITAL_COMMUNITY): Payer: Self-pay | Admitting: Internal Medicine

## 2024-09-18 LAB — SURGICAL PATHOLOGY

## 2024-09-21 ENCOUNTER — Other Ambulatory Visit: Payer: Self-pay | Admitting: Family

## 2024-09-21 DIAGNOSIS — I10 Essential (primary) hypertension: Secondary | ICD-10-CM

## 2024-09-21 NOTE — Telephone Encounter (Signed)
 Please send Bisoprolol  Fumarate refill to patient's primary provider Alfrieda Pillar, DO.

## 2024-09-21 NOTE — Telephone Encounter (Signed)
 Noted.

## 2024-10-11 ENCOUNTER — Ambulatory Visit: Admitting: Podiatry

## 2024-10-30 ENCOUNTER — Ambulatory Visit: Admitting: Internal Medicine

## 2024-11-22 ENCOUNTER — Encounter

## 2024-11-23 ENCOUNTER — Ambulatory Visit: Admitting: Internal Medicine

## 2025-02-21 ENCOUNTER — Encounter

## 2025-05-23 ENCOUNTER — Encounter

## 2025-08-26 ENCOUNTER — Encounter

## 2025-11-21 ENCOUNTER — Encounter
